# Patient Record
Sex: Male | Born: 1950 | Race: White | Hispanic: No | Smoking: Former smoker
Health system: Southern US, Community
[De-identification: ages and names within clinical notes are randomized; demographics above are authoritative.]

## PROBLEM LIST (undated history)

## (undated) DIAGNOSIS — I10 Essential (primary) hypertension: Secondary | ICD-10-CM

## (undated) DIAGNOSIS — K219 Gastro-esophageal reflux disease without esophagitis: Secondary | ICD-10-CM

## (undated) DIAGNOSIS — J9601 Acute respiratory failure with hypoxia: Secondary | ICD-10-CM

## (undated) DIAGNOSIS — R42 Dizziness and giddiness: Secondary | ICD-10-CM

## (undated) DIAGNOSIS — I2510 Atherosclerotic heart disease of native coronary artery without angina pectoris: Secondary | ICD-10-CM

## (undated) DIAGNOSIS — K922 Gastrointestinal hemorrhage, unspecified: Secondary | ICD-10-CM

## (undated) DIAGNOSIS — I447 Left bundle-branch block, unspecified: Secondary | ICD-10-CM

## (undated) DIAGNOSIS — I5020 Unspecified systolic (congestive) heart failure: Secondary | ICD-10-CM

## (undated) DIAGNOSIS — E785 Hyperlipidemia, unspecified: Secondary | ICD-10-CM

## (undated) DIAGNOSIS — I255 Ischemic cardiomyopathy: Secondary | ICD-10-CM

## (undated) DIAGNOSIS — R160 Hepatomegaly, not elsewhere classified: Secondary | ICD-10-CM

## (undated) DIAGNOSIS — I509 Heart failure, unspecified: Secondary | ICD-10-CM

## (undated) DIAGNOSIS — G629 Polyneuropathy, unspecified: Secondary | ICD-10-CM

## (undated) HISTORY — PX: CORONARY STENT PLACEMENT: SHX1402

## (undated) HISTORY — DX: Unspecified systolic (congestive) heart failure: I50.20

## (undated) HISTORY — DX: Gastro-esophageal reflux disease without esophagitis: K21.9

## (undated) HISTORY — DX: Morbid (severe) obesity due to excess calories: E66.01

## (undated) HISTORY — DX: Atherosclerotic heart disease of native coronary artery without angina pectoris: I25.10

## (undated) HISTORY — DX: Gastrointestinal hemorrhage, unspecified: K92.2

## (undated) HISTORY — DX: Ischemic cardiomyopathy: I25.5

---

## 1997-11-01 ENCOUNTER — Ambulatory Visit (HOSPITAL_COMMUNITY): Admission: RE | Admit: 1997-11-01 | Discharge: 1997-11-01 | Payer: Self-pay | Admitting: Cardiovascular Disease

## 1998-01-11 ENCOUNTER — Ambulatory Visit: Admission: RE | Admit: 1998-01-11 | Discharge: 1998-01-11 | Payer: Self-pay | Admitting: Pulmonary Disease

## 2008-02-23 ENCOUNTER — Ambulatory Visit: Payer: Self-pay | Admitting: Gastroenterology

## 2008-03-07 ENCOUNTER — Ambulatory Visit: Payer: Self-pay | Admitting: Gastroenterology

## 2008-03-07 LAB — HM COLONOSCOPY

## 2010-05-26 LAB — GLUCOSE, CAPILLARY: Glucose-Capillary: 158 mg/dL — ABNORMAL HIGH (ref 70–99)

## 2011-04-12 ENCOUNTER — Emergency Department (HOSPITAL_COMMUNITY)
Admission: EM | Admit: 2011-04-12 | Discharge: 2011-04-12 | Disposition: A | Discharge status: Home Health | Payer: No Typology Code available for payment source | Attending: Emergency Medicine | Admitting: Emergency Medicine

## 2011-04-12 ENCOUNTER — Encounter (HOSPITAL_COMMUNITY): Payer: Self-pay | Admitting: Emergency Medicine

## 2011-04-12 ENCOUNTER — Emergency Department (HOSPITAL_COMMUNITY): Payer: No Typology Code available for payment source

## 2011-04-12 ENCOUNTER — Other Ambulatory Visit: Payer: Self-pay

## 2011-04-12 DIAGNOSIS — H81399 Other peripheral vertigo, unspecified ear: Secondary | ICD-10-CM | POA: Insufficient documentation

## 2011-04-12 DIAGNOSIS — R110 Nausea: Secondary | ICD-10-CM | POA: Insufficient documentation

## 2011-04-12 DIAGNOSIS — I252 Old myocardial infarction: Secondary | ICD-10-CM | POA: Insufficient documentation

## 2011-04-12 DIAGNOSIS — E119 Type 2 diabetes mellitus without complications: Secondary | ICD-10-CM | POA: Insufficient documentation

## 2011-04-12 DIAGNOSIS — I219 Acute myocardial infarction, unspecified: Secondary | ICD-10-CM | POA: Insufficient documentation

## 2011-04-12 DIAGNOSIS — R61 Generalized hyperhidrosis: Secondary | ICD-10-CM | POA: Insufficient documentation

## 2011-04-12 LAB — COMPREHENSIVE METABOLIC PANEL
ALT: 63 U/L — ABNORMAL HIGH (ref 0–53)
AST: 69 U/L — ABNORMAL HIGH (ref 0–37)
Albumin: 3.6 g/dL (ref 3.5–5.2)
CO2: 21 mEq/L (ref 19–32)
Chloride: 95 mEq/L — ABNORMAL LOW (ref 96–112)
Creatinine, Ser: 0.79 mg/dL (ref 0.50–1.35)
GFR calc non Af Amer: >90 mL/min (ref >90)
Potassium: 3.5 mEq/L (ref 3.5–5.1)
Sodium: 134 mEq/L — ABNORMAL LOW (ref 135–145)
Total Bilirubin: 0.6 mg/dL (ref 0.3–1.2)

## 2011-04-12 LAB — DIFFERENTIAL
Basophils Absolute: 0 10*3/uL (ref 0.0–0.1)
Basophils Relative: 0 % (ref 0–1)
Lymphocytes Relative: 27 % (ref 12–46)
Neutro Abs: 5 10*3/uL (ref 1.7–7.7)
Neutrophils Relative %: 63 % (ref 43–77)

## 2011-04-12 LAB — CBC
HCT: 42.9 % (ref 39.0–52.0)
MCHC: 36.1 g/dL — ABNORMAL HIGH (ref 30.0–36.0)
Platelets: 183 10*3/uL (ref 150–400)
RDW: 13 % (ref 11.5–15.5)
WBC: 8 10*3/uL (ref 4.0–10.5)

## 2011-04-12 LAB — CARDIAC PANEL(CRET KIN+CKTOT+MB+TROPI): CK, MB: 2.8 ng/mL (ref 0.3–4.0)

## 2011-04-12 MED ORDER — DIAZEPAM 5 MG/ML IJ SOLN
5.0000 mg | Freq: Once | INTRAMUSCULAR | Status: AC
  Administered 2011-04-12: 5 mg via INTRAVENOUS
  Filled 2011-04-12: qty 2

## 2011-04-12 MED ORDER — SODIUM CHLORIDE 0.9 % IV SOLN
Freq: Once | INTRAVENOUS | Status: AC
  Administered 2011-04-12: 05:00:00 via INTRAVENOUS

## 2011-04-12 MED ORDER — MECLIZINE HCL 25 MG PO TABS
25.0000 mg | ORAL_TABLET | Freq: Once | ORAL | Status: AC
  Administered 2011-04-12: 25 mg via ORAL
  Filled 2011-04-12: qty 1

## 2011-04-12 MED ORDER — LORAZEPAM 1 MG PO TABS: 1.0000 mg | ORAL_TABLET | Freq: Three times a day (TID) | ORAL | Status: AC | PRN

## 2011-04-12 MED ORDER — MECLIZINE HCL 25 MG PO TABS: 25.0000 mg | ORAL_TABLET | Freq: Three times a day (TID) | ORAL | Status: AC | PRN

## 2011-04-12 NOTE — ED Provider Notes (Signed)
Patient is reevaluated after a dose of IV Valium and dizziness is markedly improved. He'll be sent home with prescriptions for meclizine and lorazepam.  Delora Fuel, MD 77/11/65 [id number]

## 2011-04-12 NOTE — ED Notes (Signed)
Arrived in the room, complain of dizziness, sweating at home this morning. Hx stent x3,

## 2011-04-12 NOTE — ED Notes (Signed)
To CT now

## 2011-04-12 NOTE — ED Provider Notes (Signed)
History     CSN: [id number]  Arrival date & time 04/12/11  0448   First MD Initiated Contact with Patient 04/12/11 0503      Chief Complaint  Patient presents with  . Dizziness    (Consider location/radiation/quality/duration/timing/severity/associated sxs/prior treatment) HPI Comments: Patient woke from sleep at [facility] with acute onset of dizziness, room-spinning sensation.  He became nauseated, sweaty.  Worse with movement, better with lying still.  He has a history of CAD with stents about 10 years ago.  No headache.  No fevers or chills.  The history is provided by the patient.    Past Medical History  Diagnosis Date  . Diabetes mellitus   . MI (myocardial infarction)     Past Surgical History  Procedure Date  . Coronary stent placement     History reviewed. No pertinent family history.  History  Substance Use Topics  . Smoking status: Never Smoker   . Smokeless tobacco: Not on file  . Alcohol Use: Yes     Occassional Use      Review of Systems  All other systems reviewed and are negative.    Allergies  Review of patient's allergies indicates no known allergies.  Home Medications  No current outpatient prescriptions on file.  BP 156/74  Pulse 86  Temp(Src) 98.1 F (36.7 C) (Oral)  Resp 20  SpO2 98%  Physical Exam  Nursing note and vitals reviewed. Constitutional: He is oriented to person, place, and time. He appears well-developed and well-nourished. No distress.  HENT:  Head: Normocephalic and atraumatic.  Mouth/Throat: Oropharynx is clear and moist.  Eyes: EOM are normal. Pupils are equal, round, and reactive to light.  Neck: Normal range of motion. Neck supple.  Cardiovascular: Normal rate and regular rhythm.  Exam reveals no friction rub.   No murmur heard. Pulmonary/Chest: Effort normal and breath sounds normal. No respiratory distress. He has no wheezes.  Abdominal: Soft. Bowel sounds are normal. He exhibits no distension. There is no  tenderness.  Musculoskeletal: Normal range of motion. He exhibits no edema.  Lymphadenopathy:    He has no cervical adenopathy.  Neurological: He is alert and oriented to person, place, and time. No cranial nerve deficit. Coordination normal.  Skin: Skin is warm and dry. He is not diaphoretic.    ED Course  Procedures (including critical care time)   Labs Reviewed  CBC  DIFFERENTIAL  COMPREHENSIVE METABOLIC PANEL  CARDIAC PANEL(CRET KIN+CKTOT+MB+TROPI)   No results found.   No diagnosis found.   Date: 04/12/2011  Rate: 89  Rhythm: normal sinus rhythm  QRS Axis: normal  Intervals: normal  ST/T Wave abnormalities: normal  Conduction Disutrbances:none  Narrative Interpretation:   Old EKG Reviewed: none available    MDM  The patient presents complaining of dizziness that sounds vertiginous in nature.  It is worse with turning head and changes in position and improves with rest.  The workup reveals no cardiac etiology and the CT head looks okay.  He was given meclizine with limited relief.  I have also given a dose of valium and will see how he responds.          Veryl Speak, MD 04/12/11 [phone]

## 2011-04-12 NOTE — ED Notes (Signed)
Patient complaining of dizziness, nausea, vomiting, and diaphoresis that woke him up from his sleep around 0230 this morning.  Patient denies chest pain, shortness of breath, and blurred vision.  Patient reports recent hyperglycemia (reading around 200 yesterday).

## 2011-04-12 NOTE — ED Notes (Signed)
Returned from CT.

## 2012-04-03 ENCOUNTER — Encounter (HOSPITAL_COMMUNITY): Payer: Self-pay | Admitting: Emergency Medicine

## 2012-04-03 ENCOUNTER — Emergency Department (HOSPITAL_COMMUNITY): Payer: 59 | Admitting: Anesthesiology

## 2012-04-03 ENCOUNTER — Emergency Department (HOSPITAL_COMMUNITY): Payer: 59

## 2012-04-03 ENCOUNTER — Inpatient Hospital Stay (HOSPITAL_COMMUNITY)
Admission: EM | Admit: 2012-04-03 | Discharge: 2012-04-06 | DRG: 465 | Disposition: A | Discharge status: Home Health | Payer: 59 | Attending: Orthopedic Surgery | Admitting: Orthopedic Surgery

## 2012-04-03 ENCOUNTER — Encounter (HOSPITAL_COMMUNITY): Payer: Self-pay | Admitting: Anesthesiology

## 2012-04-03 ENCOUNTER — Encounter (HOSPITAL_COMMUNITY)
Admission: EM | Disposition: A | Discharge status: Home Health | Payer: Self-pay | Source: Home / Self Care | Attending: Orthopedic Surgery

## 2012-04-03 DIAGNOSIS — K219 Gastro-esophageal reflux disease without esophagitis: Secondary | ICD-10-CM | POA: Diagnosis present

## 2012-04-03 DIAGNOSIS — Z9861 Coronary angioplasty status: Secondary | ICD-10-CM

## 2012-04-03 DIAGNOSIS — Y92410 Unspecified street and highway as the place of occurrence of the external cause: Secondary | ICD-10-CM

## 2012-04-03 DIAGNOSIS — I2510 Atherosclerotic heart disease of native coronary artery without angina pectoris: Secondary | ICD-10-CM | POA: Diagnosis present

## 2012-04-03 DIAGNOSIS — S5010XA Contusion of unspecified forearm, initial encounter: Secondary | ICD-10-CM | POA: Diagnosis present

## 2012-04-03 DIAGNOSIS — S82109B Unspecified fracture of upper end of unspecified tibia, initial encounter for open fracture type I or II: Principal | ICD-10-CM | POA: Diagnosis present

## 2012-04-03 DIAGNOSIS — E119 Type 2 diabetes mellitus without complications: Secondary | ICD-10-CM | POA: Diagnosis present

## 2012-04-03 DIAGNOSIS — Z7982 Long term (current) use of aspirin: Secondary | ICD-10-CM

## 2012-04-03 DIAGNOSIS — I252 Old myocardial infarction: Secondary | ICD-10-CM

## 2012-04-03 DIAGNOSIS — Z79899 Other long term (current) drug therapy: Secondary | ICD-10-CM

## 2012-04-03 HISTORY — PX: IRRIGATION AND DEBRIDEMENT KNEE: SHX5185

## 2012-04-03 HISTORY — PX: EXTERNAL FIXATION LEG: SHX1549

## 2012-04-03 LAB — GLUCOSE, CAPILLARY
Glucose-Capillary: 254 mg/dL — ABNORMAL HIGH (ref 70–99)
Glucose-Capillary: 258 mg/dL — ABNORMAL HIGH (ref 70–99)

## 2012-04-03 SURGERY — EXTERNAL FIXATION, LOWER EXTREMITY
Anesthesia: General | Site: Knee | Laterality: Left | Wound class: Dirty or Infected

## 2012-04-03 MED ORDER — VECURONIUM BROMIDE 10 MG IV SOLR
INTRAVENOUS | Status: DC | PRN
  Administered 2012-04-03: 6 mg via INTRAVENOUS

## 2012-04-03 MED ORDER — ONDANSETRON HCL 4 MG/2ML IJ SOLN
INTRAMUSCULAR | Status: DC | PRN
  Administered 2012-04-03: 4 mg via INTRAVENOUS

## 2012-04-03 MED ORDER — OXYCODONE HCL 5 MG PO TABS: 5.0000 mg | ORAL_TABLET | Freq: Once | ORAL | Status: AC | PRN

## 2012-04-03 MED ORDER — PROPOFOL 10 MG/ML IV BOLUS
INTRAVENOUS | Status: DC | PRN
  Administered 2012-04-03: 150 mg via INTRAVENOUS

## 2012-04-03 MED ORDER — CHLORHEXIDINE GLUCONATE 4 % EX LIQD
60.0000 mL | Freq: Once | CUTANEOUS | Status: DC
  Filled 2012-04-03: qty 60

## 2012-04-03 MED ORDER — SODIUM CHLORIDE 0.9 % IR SOLN
Status: DC | PRN
  Administered 2012-04-03: 9000 mL

## 2012-04-03 MED ORDER — DEXTROSE 5 % IV SOLN
INTRAVENOUS | Status: DC | PRN
  Administered 2012-04-03: 22:00:00 via INTRAVENOUS

## 2012-04-03 MED ORDER — OXYCODONE HCL 5 MG/5ML PO SOLN: 5.0000 mg | Freq: Once | ORAL | Status: AC | PRN

## 2012-04-03 MED ORDER — CEFAZOLIN SODIUM 1 G IJ SOLR
2.0000 g | INTRAMUSCULAR | Status: AC
  Administered 2012-04-03: 2 g via INTRAVENOUS

## 2012-04-03 MED ORDER — ARTIFICIAL TEARS OP OINT
TOPICAL_OINTMENT | OPHTHALMIC | Status: DC | PRN
  Administered 2012-04-03: 1 via OPHTHALMIC

## 2012-04-03 MED ORDER — METOCLOPRAMIDE HCL 5 MG/ML IJ SOLN
INTRAMUSCULAR | Status: DC | PRN
  Administered 2012-04-03: 10 mg via INTRAVENOUS

## 2012-04-03 MED ORDER — ACETAMINOPHEN 10 MG/ML IV SOLN
INTRAVENOUS | Status: AC
  Filled 2012-04-03: qty 100

## 2012-04-03 MED ORDER — GLYCOPYRROLATE 0.2 MG/ML IJ SOLN
INTRAMUSCULAR | Status: DC | PRN
  Administered 2012-04-03: 0.6 mg via INTRAVENOUS

## 2012-04-03 MED ORDER — LIDOCAINE HCL (CARDIAC) 20 MG/ML IV SOLN
INTRAVENOUS | Status: DC | PRN
  Administered 2012-04-03: 80 mg via INTRAVENOUS

## 2012-04-03 MED ORDER — HYDROMORPHONE HCL PF 1 MG/ML IJ SOLN
0.2500 mg | INTRAMUSCULAR | Status: DC | PRN
  Administered 2012-04-03 – 2012-04-04 (×4): 0.5 mg via INTRAVENOUS

## 2012-04-03 MED ORDER — HYDROMORPHONE HCL PF 1 MG/ML IJ SOLN
INTRAMUSCULAR | Status: AC
  Filled 2012-04-03: qty 1

## 2012-04-03 MED ORDER — SODIUM CHLORIDE 0.9 % IV SOLN: INTRAVENOUS | Status: DC

## 2012-04-03 MED ORDER — LACTATED RINGERS IV SOLN
INTRAVENOUS | Status: DC | PRN
  Administered 2012-04-03 (×2): via INTRAVENOUS

## 2012-04-03 MED ORDER — FENTANYL CITRATE 0.05 MG/ML IJ SOLN
INTRAMUSCULAR | Status: DC | PRN
  Administered 2012-04-03: 100 ug via INTRAVENOUS
  Administered 2012-04-03 (×2): 50 ug via INTRAVENOUS
  Administered 2012-04-03: 100 ug via INTRAVENOUS

## 2012-04-03 MED ORDER — ONDANSETRON HCL 4 MG/2ML IJ SOLN: 4.0000 mg | Freq: Once | INTRAMUSCULAR | Status: AC | PRN

## 2012-04-03 MED ORDER — MIDAZOLAM HCL 5 MG/5ML IJ SOLN
INTRAMUSCULAR | Status: DC | PRN
  Administered 2012-04-03: 2 mg via INTRAVENOUS

## 2012-04-03 MED ORDER — SUCCINYLCHOLINE CHLORIDE 20 MG/ML IJ SOLN
INTRAMUSCULAR | Status: DC | PRN
  Administered 2012-04-03: 120 mg via INTRAVENOUS

## 2012-04-03 MED ORDER — ACETAMINOPHEN 10 MG/ML IV SOLN
INTRAVENOUS | Status: DC | PRN
  Administered 2012-04-03: 1000 mg via INTRAVENOUS

## 2012-04-03 MED ORDER — SODIUM CHLORIDE 0.9 % IV SOLN
10.0000 mg | INTRAVENOUS | Status: DC | PRN
  Administered 2012-04-03: 15 ug/min via INTRAVENOUS

## 2012-04-03 MED ORDER — NEOSTIGMINE METHYLSULFATE 1 MG/ML IJ SOLN
INTRAMUSCULAR | Status: DC | PRN
  Administered 2012-04-03: 5 mg via INTRAVENOUS

## 2012-04-03 SURGICAL SUPPLY — 60 items
5MM SHORT HALF PIN DRILL BIT ×2 IMPLANT
5mm x 45mm half pin ×4 IMPLANT
BANDAGE GAUZE ELAST BULKY 4 IN (GAUZE/BANDAGES/DRESSINGS) ×2 IMPLANT
BAR EXFIX SM BONE 10.5X250 (Trauma) ×2 IMPLANT
BLADE SURG 10 STRL SS (BLADE) ×2 IMPLANT
BNDG COHESIVE 4X5 TAN STRL (GAUZE/BANDAGES/DRESSINGS) ×2 IMPLANT
BNDG COHESIVE 6X5 TAN STRL LF (GAUZE/BANDAGES/DRESSINGS) ×2 IMPLANT
BNDG ELASTIC 6X15 VLCR STRL LF (GAUZE/BANDAGES/DRESSINGS) ×2 IMPLANT
CHLORAPREP W/TINT 26ML (MISCELLANEOUS) ×2 IMPLANT
CLAMP BAR TO BAR (Clamp) ×4 IMPLANT
CLAMP BAR TO RING (Clamp) ×8 IMPLANT
CLAMP PIN MULTI (Clamp) ×4 IMPLANT
CLOTH BEACON ORANGE TIMEOUT ST (SAFETY) ×2 IMPLANT
COVER SURGICAL LIGHT HANDLE (MISCELLANEOUS) ×2 IMPLANT
CUFF TOURNIQUET SINGLE 34IN LL (TOURNIQUET CUFF) ×2 IMPLANT
CUFF TOURNIQUET SINGLE 44IN (TOURNIQUET CUFF) IMPLANT
DRAIN JACKSON PRATT 10MM FLAT (MISCELLANEOUS) ×2 IMPLANT
DRAPE C-ARM 42X72 X-RAY (DRAPES) ×2 IMPLANT
DRAPE C-ARMOR (DRAPES) ×2 IMPLANT
DRAPE U-SHAPE 47X51 STRL (DRAPES) ×2 IMPLANT
DRSG ADAPTIC 3X8 NADH LF (GAUZE/BANDAGES/DRESSINGS) ×2 IMPLANT
DRSG PAD ABDOMINAL 8X10 ST (GAUZE/BANDAGES/DRESSINGS) IMPLANT
ELECT REM PT RETURN 9FT ADLT (ELECTROSURGICAL) ×2
ELECTRODE REM PT RTRN 9FT ADLT (ELECTROSURGICAL) ×1 IMPLANT
EVACUATOR SILICONE 100CC (DRAIN) ×2 IMPLANT
GAUZE XEROFORM 5X9 LF (GAUZE/BANDAGES/DRESSINGS) ×2 IMPLANT
GLOVE BIO SURGEON STRL SZ8 (GLOVE) ×6 IMPLANT
GLOVE BIOGEL PI IND STRL 8 (GLOVE) ×3 IMPLANT
GLOVE BIOGEL PI INDICATOR 8 (GLOVE) ×3
GOWN PREVENTION PLUS XLARGE (GOWN DISPOSABLE) ×2 IMPLANT
GOWN STRL NON-REIN LRG LVL3 (GOWN DISPOSABLE) IMPLANT
GOWN STRL REIN 2XL LVL4 (GOWN DISPOSABLE) ×4 IMPLANT
IV NS IRRIG 3000ML ARTHROMATIC (IV SOLUTION) ×6 IMPLANT
KIT BASIN OR (CUSTOM PROCEDURE TRAY) ×2 IMPLANT
KIT ROOM TURNOVER OR (KITS) ×2 IMPLANT
MANIFOLD NEPTUNE II (INSTRUMENTS) ×2 IMPLANT
NEEDLE 22X1 1/2 (OR ONLY) (NEEDLE) IMPLANT
NS IRRIG 1000ML POUR BTL (IV SOLUTION) IMPLANT
PACK ORTHO EXTREMITY (CUSTOM PROCEDURE TRAY) ×2 IMPLANT
PAD ARMBOARD 7.5X6 YLW CONV (MISCELLANEOUS) ×4 IMPLANT
PAD CAST 4YDX4 CTTN HI CHSV (CAST SUPPLIES) ×1 IMPLANT
PADDING CAST COTTON 4X4 STRL (CAST SUPPLIES) ×1
PADDING CAST COTTON 6X4 STRL (CAST SUPPLIES) ×2 IMPLANT
SMITH AND NEPHEW 4.8 LONG DRILL ×2 IMPLANT
SOAP 2 % CHG 4 OZ (WOUND CARE) ×2 IMPLANT
SPONGE GAUZE 4X4 12PLY (GAUZE/BANDAGES/DRESSINGS) ×2 IMPLANT
STAPLER VISISTAT 35W (STAPLE) IMPLANT
SUCTION FRAZIER TIP 10 FR DISP (SUCTIONS) IMPLANT
SUT ETHILON 2 0 PSLX (SUTURE) ×2 IMPLANT
SUT PROLENE 3 0 PS 2 (SUTURE) ×2 IMPLANT
SUT VIC AB 3-0 PS2 18 (SUTURE) ×1
SUT VIC AB 3-0 PS2 18XBRD (SUTURE) ×1 IMPLANT
SYR CONTROL 10ML LL (SYRINGE) IMPLANT
TOWEL OR 17X24 6PK STRL BLUE (TOWEL DISPOSABLE) ×2 IMPLANT
TOWEL OR 17X26 10 PK STRL BLUE (TOWEL DISPOSABLE) ×2 IMPLANT
TUBE CONNECTING 12X1/4 (SUCTIONS) ×2 IMPLANT
TUBING TUR DISP (UROLOGICAL SUPPLIES) ×2 IMPLANT
WATER STERILE IRR 1000ML POUR (IV SOLUTION) IMPLANT
half pin ×4 IMPLANT
jet x fix ×4 IMPLANT
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
## 2012-04-03 NOTE — Anesthesia Preprocedure Evaluation (Signed)
Anesthesia Evaluation  Patient identified by MRN, date of birth, ID band Patient awake    Reviewed: Allergy & Precautions, H&P , NPO status , Patient's Chart, lab work & pertinent test results  Airway Mallampati: I TM Distance: >3 FB Neck ROM: Full    Dental  (+) Edentulous Upper, Edentulous Lower, Lower Dentures, Upper Dentures and Dental Advisory Given   Pulmonary  breath sounds clear to auscultation        Cardiovascular + Past MI Rhythm:Regular Rate:Normal     Neuro/Psych    GI/Hepatic   Endo/Other  diabetes, Well Controlled, Type 2, Oral Hypoglycemic Agents  Renal/GU      Musculoskeletal   Abdominal   Peds  Hematology   Anesthesia Other Findings   Reproductive/Obstetrics                           Anesthesia Physical Anesthesia Plan  ASA: III and emergent  Anesthesia Plan: General   Post-op Pain Management:    Induction: Intravenous, Rapid sequence and Cricoid pressure planned  Airway Management Planned: Oral ETT  Additional Equipment:   Intra-op Plan:   Post-operative Plan: Extubation in OR  Informed Consent: I have reviewed the patients History and Physical, chart, labs and discussed the procedure including the risks, benefits and alternatives for the proposed anesthesia with the patient or authorized representative who has indicated his/her understanding and acceptance.   Dental advisory given  Plan Discussed with: CRNA, Anesthesiologist and Surgeon  Anesthesia Plan Comments:         Anesthesia Quick Evaluation

## 2012-04-03 NOTE — Transfer of Care (Signed)
Immediate Anesthesia Transfer of Care Note  Patient: Devin Hanna  Procedure(s) Performed: Procedure(s): EXTERNAL FIXATION LEG (Left) IRRIGATION AND DEBRIDEMENT KNEE (Left)  Patient Location: PACU  Anesthesia Type:General  Level of Consciousness: oriented, sedated, patient cooperative and responds to stimulation  Airway & Oxygen Therapy: Patient Spontanous Breathing and Patient connected to nasal cannula oxygen  Post-op Assessment: Report given to PACU RN, Post -op Vital signs reviewed and stable, Patient moving all extremities and Patient moving all extremities X 4  Post vital signs: Reviewed and stable  Complications: No apparent anesthesia complications

## 2012-04-03 NOTE — Brief Op Note (Addendum)
04/03/2012  11:35 PM  PATIENT:  Obie Kallenbach Wedel  62 y.o. male  PRE-OPERATIVE DIAGNOSIS:  Open left tibial plateau fracture  POST-OPERATIVE DIAGNOSIS:  1.  Left knee laceration 8 cm long including skin, subcut tissue and muscle 2.  Left tibial plateau fracture  Procedure(s): 1.  Irrigation and excisional debridement with complex repair 8 cm long left knee wound 2.  Closed reduction of left tibial plateau fracture 3.  Application of uniplane external fixator  SURGEON:  Wylene Simmer, MD  ASSISTANT: n/a  ANESTHESIA:   General  EBL:  150 cc  TOURNIQUET:  n/a  COMPLICATIONS:  None apparent  DISPOSITION:  Extubated, awake and stable to recovery.  DICTATION ID:  [id number]
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
## 2012-04-03 NOTE — Preoperative (Signed)
Beta Blockers   Reason not to administer Beta Blockers:Metoprolol 25 mg po @0630  on 04/03/2012

## 2012-04-03 NOTE — Anesthesia Postprocedure Evaluation (Signed)
  Anesthesia Post-op Note  Patient: Devin Hanna  Procedure(s) Performed: Procedure(s): EXTERNAL FIXATION LEG (Left) IRRIGATION AND DEBRIDEMENT KNEE (Left)  Patient Location: PACU  Anesthesia Type:General  Level of Consciousness: awake, alert  and oriented  Airway and Oxygen Therapy: Patient Spontanous Breathing and Patient connected to nasal cannula oxygen  Post-op Pain: mild  Post-op Assessment: Post-op Vital signs reviewed  Post-op Vital Signs: Reviewed  Complications: No apparent anesthesia complications

## 2012-04-03 NOTE — ED Notes (Signed)
V/S: P 101, R 18, 98% RA,

## 2012-04-04 ENCOUNTER — Inpatient Hospital Stay (HOSPITAL_COMMUNITY): Payer: 59

## 2012-04-04 LAB — GLUCOSE, CAPILLARY
Glucose-Capillary: 321 mg/dL — ABNORMAL HIGH (ref 70–99)
Glucose-Capillary: 162 mg/dL — ABNORMAL HIGH (ref 70–99)

## 2012-04-04 LAB — BASIC METABOLIC PANEL
CO2: 20 mEq/L (ref 19–32)
Chloride: 98 mEq/L (ref 96–112)
Glucose, Bld: 322 mg/dL — ABNORMAL HIGH (ref 70–99)
Potassium: 4.4 mEq/L (ref 3.5–5.1)
Sodium: 134 mEq/L — ABNORMAL LOW (ref 135–145)

## 2012-04-04 LAB — CBC
Hemoglobin: 11.9 g/dL — ABNORMAL LOW (ref 13.0–17.0)
MCH: 32.6 pg (ref 26.0–34.0)
MCV: 92.9 fL (ref 78.0–100.0)
RBC: 3.65 MIL/uL — ABNORMAL LOW (ref 4.22–5.81)

## 2012-04-04 MED ORDER — LISINOPRIL 20 MG PO TABS
20.0000 mg | ORAL_TABLET | Freq: Every day | ORAL | Status: DC
  Administered 2012-04-04 – 2012-04-06 (×3): 20 mg via ORAL
  Filled 2012-04-04 (×3): qty 1

## 2012-04-04 MED ORDER — SENNA 8.6 MG PO TABS
2.0000 | ORAL_TABLET | Freq: Two times a day (BID) | ORAL | Status: DC
  Administered 2012-04-04 – 2012-04-06 (×5): 17.2 mg via ORAL
  Filled 2012-04-04 (×6): qty 2

## 2012-04-04 MED ORDER — GLIPIZIDE 5 MG PO TABS
5.0000 mg | ORAL_TABLET | Freq: Every day | ORAL | Status: DC
  Administered 2012-04-04 – 2012-04-05 (×2): 5 mg via ORAL
  Filled 2012-04-04 (×3): qty 1

## 2012-04-04 MED ORDER — DOCUSATE SODIUM 100 MG PO CAPS
100.0000 mg | ORAL_CAPSULE | Freq: Two times a day (BID) | ORAL | Status: DC
  Administered 2012-04-04 – 2012-04-06 (×5): 100 mg via ORAL
  Filled 2012-04-04 (×6): qty 1

## 2012-04-04 MED ORDER — METOPROLOL TARTRATE 25 MG PO TABS
25.0000 mg | ORAL_TABLET | Freq: Two times a day (BID) | ORAL | Status: DC
  Administered 2012-04-04 – 2012-04-06 (×5): 25 mg via ORAL
  Filled 2012-04-04 (×6): qty 1

## 2012-04-04 MED ORDER — PROMETHAZINE HCL 25 MG/ML IJ SOLN
12.5000 mg | Freq: Four times a day (QID) | INTRAMUSCULAR | Status: DC | PRN
  Administered 2012-04-04: 12.5 mg via INTRAVENOUS
  Filled 2012-04-04: qty 1

## 2012-04-04 MED ORDER — ONDANSETRON HCL 4 MG/2ML IJ SOLN
4.0000 mg | Freq: Four times a day (QID) | INTRAMUSCULAR | Status: DC | PRN
  Administered 2012-04-05: 4 mg via INTRAVENOUS
  Filled 2012-04-04: qty 2

## 2012-04-04 MED ORDER — ENOXAPARIN SODIUM 40 MG/0.4ML ~~LOC~~ SOLN
40.0000 mg | Freq: Every day | SUBCUTANEOUS | Status: DC
  Administered 2012-04-04 – 2012-04-06 (×3): 40 mg via SUBCUTANEOUS
  Filled 2012-04-04 (×3): qty 0.4

## 2012-04-04 MED ORDER — OXYCODONE HCL 5 MG PO TABS
5.0000 mg | ORAL_TABLET | ORAL | Status: DC | PRN
  Administered 2012-04-04 – 2012-04-06 (×7): 10 mg via ORAL
  Filled 2012-04-04 (×7): qty 2

## 2012-04-04 MED ORDER — ALUM & MAG HYDROXIDE-SIMETH 200-200-20 MG/5ML PO SUSP
15.0000 mL | ORAL | Status: DC | PRN
  Administered 2012-04-04 (×4): 15 mL via ORAL
  Filled 2012-04-04 (×4): qty 30

## 2012-04-04 MED ORDER — ONDANSETRON HCL 4 MG PO TABS
4.0000 mg | ORAL_TABLET | Freq: Four times a day (QID) | ORAL | Status: DC | PRN
  Administered 2012-04-04: 4 mg via ORAL
  Filled 2012-04-04: qty 1

## 2012-04-04 MED ORDER — SIMVASTATIN 20 MG PO TABS
20.0000 mg | ORAL_TABLET | Freq: Every evening | ORAL | Status: DC
  Administered 2012-04-04 – 2012-04-05 (×2): 20 mg via ORAL
  Filled 2012-04-04 (×3): qty 1

## 2012-04-04 MED ORDER — HYDROMORPHONE HCL PF 1 MG/ML IJ SOLN
INTRAMUSCULAR | Status: AC
  Filled 2012-04-04: qty 1

## 2012-04-04 MED ORDER — SODIUM CHLORIDE 0.9 % IV SOLN: INTRAVENOUS | Status: DC

## 2012-04-04 MED ORDER — INSULIN ASPART 100 UNIT/ML ~~LOC~~ SOLN
0.0000 [IU] | Freq: Three times a day (TID) | SUBCUTANEOUS | Status: DC
  Administered 2012-04-04: 8 [IU] via SUBCUTANEOUS
  Administered 2012-04-04: 3 [IU] via SUBCUTANEOUS
  Administered 2012-04-04: 11 [IU] via SUBCUTANEOUS
  Administered 2012-04-05: 3 [IU] via SUBCUTANEOUS
  Administered 2012-04-05: 8 [IU] via SUBCUTANEOUS

## 2012-04-04 MED ORDER — QUINAPRIL HCL 10 MG PO TABS: 20.0000 mg | ORAL_TABLET | Freq: Every morning | ORAL | Status: DC

## 2012-04-04 MED ORDER — GLIPIZIDE 5 MG PO TABS
10.0000 mg | ORAL_TABLET | Freq: Every day | ORAL | Status: DC
  Administered 2012-04-04 – 2012-04-06 (×3): 10 mg via ORAL
  Filled 2012-04-04 (×4): qty 2

## 2012-04-04 MED ORDER — METFORMIN HCL 500 MG PO TABS
1000.0000 mg | ORAL_TABLET | Freq: Two times a day (BID) | ORAL | Status: DC
  Administered 2012-04-04 – 2012-04-06 (×5): 1000 mg via ORAL
  Filled 2012-04-04 (×7): qty 2

## 2012-04-04 MED ORDER — CEFAZOLIN SODIUM-DEXTROSE 2-3 GM-% IV SOLR
2.0000 g | Freq: Four times a day (QID) | INTRAVENOUS | Status: AC
  Administered 2012-04-04 (×3): 2 g via INTRAVENOUS
  Filled 2012-04-04 (×4): qty 50

## 2012-04-04 MED ORDER — HYDROMORPHONE HCL PF 1 MG/ML IJ SOLN: 0.5000 mg | INTRAMUSCULAR | Status: DC | PRN

## 2012-04-04 NOTE — Progress Notes (Signed)
Subjective: 1 Day Post-Op Procedure(s) (LRB): EXTERNAL FIXATION LEG (Left) IRRIGATION AND DEBRIDEMENT KNEE (Left) Patient reports pain as mild.   Denies pain elsewhere on his body aside from left lower extremity.  Specifically he denies pain at the left forearm.  He c/o reflux for which he usually takes Tums.  Objective: Vital signs in last 24 hours: Temp:  [97.7 F (36.5 C)-99.2 F (37.3 C)] 98.7 F (37.1 C) (02/24 0239) Pulse Rate:  [80-97] 94 (02/24 0239) Resp:  [16-30] 18 (02/24 0239) BP: (90-146)/(48-77) 146/77 mmHg (02/24 0239) SpO2:  [95 %-99 %] 98 % (02/24 0239) Weight:  [102.059 kg (225 lb)] 102.059 kg (225 lb) (02/24 0036)  Intake/Output from previous day: 02/23 0701 - 02/24 0700 In: 2450 [P.O.:600; I.V.:1800; IV Piggyback:50] Out: 65 [Urine:800; Drains:40; Blood:50] Intake/Output this shift:    No results found for this basename: HGB,  in the last 72 hours No results found for this basename: WBC, RBC, HCT, PLT,  in the last 72 hours No results found for this basename: NA, K, CL, CO2, BUN, CREATININE, GLUCOSE, CALCIUM,  in the last 72 hours No results found for this basename: LABPT, INR,  in the last 72 hours  L LE with ex fix in place.  Drain with scant SS output.  wiggles toes and feels LT at foot.  Assessment/Plan: 1 Day Post-Op Procedure(s) (LRB): EXTERNAL FIXATION LEG (Left) IRRIGATION AND DEBRIDEMENT KNEE (Left) CT scan of left knee today.  Begin PT and OT with a plan to return home in the next day or two.  He'll need definitive surgery on his right tibial plateau within the next few weeks. Mylanta for reflux.  Wylene Simmer 04/04/2012, 7:32 AM

## 2012-04-04 NOTE — Addendum Note (Signed)
Addendum created 04/04/12 0106 by Fidela Juneau, CRNA   Modules edited: Anesthesia Blocks and Procedures
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
## 2012-04-04 NOTE — Anesthesia Procedure Notes (Signed)
Procedure Name: Intubation Date/Time: 04/03/2012 9:46 PM Performed by: Jacquiline Doe A Pre-anesthesia Checklist: Patient identified, Timeout performed, Emergency Drugs available, Suction available and Patient being monitored Patient Re-evaluated:Patient Re-evaluated prior to inductionOxygen Delivery Method: Circle system utilized Preoxygenation: Pre-oxygenation with 100% oxygen Intubation Type: IV induction, Rapid sequence and Cricoid Pressure applied Laryngoscope Size: Mac and 4 Grade View: Grade I Tube type: Oral Tube size: 8.0 mm Number of attempts: 1 Airway Equipment and Method: Stylet Placement Confirmation: ETT inserted through vocal cords under direct vision,  breath sounds checked- equal and bilateral and positive ETCO2 Secured at: 23 cm Tube secured with: Tape Dental Injury: Teeth and Oropharynx as per pre-operative assessment

## 2012-04-04 NOTE — Evaluation (Signed)
Physical Therapy Evaluation Patient Details Name: Devin Hanna MRN: [id number] DOB: [birth year] Today's Date: 04/04/2012 Time: 6387-5643 PT Time Calculation (min): 28 min  PT Assessment / Plan / Recommendation Clinical Impression  pt rpesents with L Tibial Plateau fx with external fixator.  pt very motivated and plans to discuss with family where he will be staying and who is available to A him.  Ideally pt would stay at brother's home to avoid any steps.  pt to check with family to see if they have any DME he can borrow.      PT Assessment  Patient needs continued PT services    Follow Up Recommendations  Home health PT;Supervision/Assistance - 24 hour    Does the patient have the potential to tolerate intense rehabilitation      Barriers to Discharge None      Equipment Recommendations  Rolling walker with 5" wheels;Wheelchair (measurements PT);Wheelchair cushion (measurements PT) (3-in-1, W/C needs elevating leg rest)    Recommendations for Other Services OT consult   Frequency Min 5X/week    Precautions / Restrictions Precautions Precautions: Fall Restrictions Weight Bearing Restrictions: Yes LLE Weight Bearing: Non weight bearing   Pertinent Vitals/Pain Indicates pain is 3/10 with mobility.        Mobility  Bed Mobility Bed Mobility: Supine to Sit;Sitting - Scoot to Edge of Bed Supine to Sit: 4: Min assist;With rails;HOB elevated Sitting - Scoot to [employer] of Bed: 4: Min assist Details for Bed Mobility Assistance: A to support L LE during bed mobility.   Transfers Transfers: Sit to Stand;Stand to Sit Sit to Stand: 4: Min assist;With upper extremity assist;From bed Stand to Sit: 4: Min assist;With upper extremity assist;To chair/3-in-1;With armrests Details for Transfer Assistance: cues for UE use, positioning of L LE, controlling descent to chair.   Ambulation/Gait Ambulation/Gait Assistance: 4: Min guard Ambulation Distance (Feet): 25 Feet Assistive device:  Rolling walker Ambulation/Gait Assistance Details: cues for positioning in RW, upright posture, hop technique.   Gait Pattern: Step-to pattern;Trunk flexed Stairs: No Wheelchair Mobility Wheelchair Mobility: No    Exercises     PT Diagnosis: Difficulty walking;Acute pain  PT Problem List: Decreased strength;Decreased activity tolerance;Decreased balance;Decreased mobility;Decreased knowledge of use of DME;Decreased knowledge of precautions;Pain PT Treatment Interventions: DME instruction;Gait training;Stair training;Functional mobility training;Therapeutic activities;Therapeutic exercise;Balance training;Patient/family education   PT Goals Acute Rehab PT Goals PT Goal Formulation: With patient Time For Goal Achievement: 04/18/12 Potential to Achieve Goals: Good Pt will go Supine/Side to Sit: with modified independence PT Goal: Supine/Side to Sit - Progress: Goal set today Pt will go Sit to Supine/Side: with modified independence PT Goal: Sit to Supine/Side - Progress: Goal set today Pt will go Sit to Stand: with modified independence PT Goal: Sit to Stand - Progress: Goal set today Pt will go Stand to Sit: with modified independence PT Goal: Stand to Sit - Progress: Goal set today Pt will Ambulate: 51 - 150 feet;with supervision;with rolling walker PT Goal: Ambulate - Progress: Goal set today Pt will Go Up / Down Stairs: 3-5 stairs;with min assist;with rolling walker PT Goal: Up/Down Stairs - Progress: Goal set today  Visit Information  Last PT Received On: 04/04/12 Assistance Needed: +2 (helpful for chair follow)    Subjective Data  Subjective: This is my first surgery.   Patient Stated Goal: Home   Prior Functioning  Home Living Lives With: Alone Available Help at Discharge: Family (At least PRN, pt to check on increased A.  ) Additional Comments: pt to  ask brother and sister-in-law if he can stay in their basement that has a flat entry and walk-in-shower.  Brother and  sister-in-law are retired and would be around most of day.  If pt ahs to D/C to home or daughter's home he will only have intermittant A from family.   Prior Function Level of Independence: Independent Able to Take Stairs?: Yes Driving: Yes Communication Communication: No difficulties    Cognition  Cognition Overall Cognitive Status: Appears within functional limits for tasks assessed/performed Arousal/Alertness: Awake/alert Orientation Level: Appears intact for tasks assessed Behavior During Session: [facility] for tasks performed    Extremity/Trunk Assessment Right Lower Extremity Assessment RLE ROM/Strength/Tone: [facility] for tasks assessed RLE Sensation: WFL - Light Touch Left Lower Extremity Assessment LLE ROM/Strength/Tone: Deficits LLE ROM/Strength/Tone Deficits: pt in external fixators maintaining knee extension.   LLE Sensation: WFL - Light Touch Trunk Assessment Trunk Assessment: Normal   Balance Balance Balance Assessed: No  End of Session PT - End of Session Equipment Utilized During Treatment: Gait belt Activity Tolerance: Patient limited by fatigue;Patient limited by pain Patient left: in chair;with call bell/phone within reach Nurse Communication: Mobility status  GP     Damaria Vachon F 04/04/2012, 9:15 AM

## 2012-04-04 NOTE — Progress Notes (Signed)
Inpatient Diabetes Program Recommendations  AACE/ADA: New Consensus Statement on Inpatient Glycemic Control (2013)  Target Ranges:  Prepandial:   less than 140 mg/dL      Peak postprandial:   less than 180 mg/dL (1-2 hours)      Critically ill patients:  140 - 180 mg/dL   Reason for Visit: CBG's greater than goal.  Note history of diabetes.  It appears that patient was only taking oral agents prior to admission for diabetes.  Results for Devin Hanna, Devin Hanna (MRN [id number]) as of 04/04/2012 11:50  Ref. Range 04/03/2012 21:10 04/03/2012 23:43 04/04/2012 07:26 04/04/2012 10:51  Glucose-Capillary Latest Range: 70-99 mg/dL 254 (H) 258 (H) 321 (H) 265 (H)   Please check A1C to determine prehospitalization glycemic control. Also, please add Lantus 20 units daily while patient is in hospital.  Will follow.  Needs follow-up with PCP after discharge for diabetes.  Adah Perl, RN, MSN, BC-ADM Diabetes Coordinator [phone]

## 2012-04-05 ENCOUNTER — Encounter (HOSPITAL_COMMUNITY): Payer: Self-pay | Admitting: Orthopedic Surgery

## 2012-04-05 LAB — GLUCOSE, CAPILLARY
Glucose-Capillary: 267 mg/dL — ABNORMAL HIGH (ref 70–99)
Glucose-Capillary: 84 mg/dL (ref 70–99)

## 2012-04-05 LAB — BASIC METABOLIC PANEL
Calcium: 9.4 mg/dL (ref 8.4–10.5)
GFR calc non Af Amer: 89 mL/min — ABNORMAL LOW (ref >90)
Glucose, Bld: 246 mg/dL — ABNORMAL HIGH (ref 70–99)
Sodium: 136 mEq/L (ref 135–145)

## 2012-04-05 MED ORDER — RIVAROXABAN 10 MG PO TABS: 10.0000 mg | ORAL_TABLET | Freq: Every day | ORAL | Status: DC

## 2012-04-05 MED ORDER — OXYCODONE HCL 5 MG PO TABS: 5.0000 mg | ORAL_TABLET | ORAL | Status: DC | PRN

## 2012-04-05 MED ORDER — SENNA 8.6 MG PO TABS: 2.0000 | ORAL_TABLET | Freq: Two times a day (BID) | ORAL | Status: DC

## 2012-04-05 MED ORDER — INSULIN GLARGINE 100 UNIT/ML ~~LOC~~ SOLN
20.0000 [IU] | Freq: Every day | SUBCUTANEOUS | Status: DC
  Administered 2012-04-05 – 2012-04-06 (×2): 20 [IU] via SUBCUTANEOUS

## 2012-04-05 MED ORDER — DSS 100 MG PO CAPS: 100.0000 mg | ORAL_CAPSULE | Freq: Two times a day (BID) | ORAL | Status: DC

## 2012-04-05 NOTE — Plan of Care (Signed)
Problem: Phase II Progression Outcomes Goal: Discharge plan established Recommend shower chair, 3 in 1, ADL A/E and HH OT for ADL trg and ADL mobility safety trg after acute care d/c
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
## 2012-04-05 NOTE — Progress Notes (Signed)
UR COMPLETED  

## 2012-04-05 NOTE — Progress Notes (Signed)
Physical Therapy Treatment Patient Details Name: Devin Hanna MRN: [id number] DOB: January 26, 1951 Today's Date: 04/05/2012 Time: 3794-4461 PT Time Calculation (min): 23 min  PT Assessment / Plan / Recommendation Comments on Treatment Session  Patient able to progress with ambulation this session and is eagerly wanting to go home today. Still attempting to figure out what equipment patient can borrow and the equipment that he will need to order. When patient getting out of bed, I noted increased about of bloody drainage on the bed and patients dressing, RN was made aware.     Follow Up Recommendations  Home health PT;Supervision/Assistance - 24 hour     Does the patient have the potential to tolerate intense rehabilitation     Barriers to Discharge        Equipment Recommendations  Rolling walker with 5" wheels;Wheelchair (measurements PT);Wheelchair cushion (measurements PT)    Recommendations for Other Services    Frequency Min 5X/week   Plan Discharge plan remains appropriate;Frequency remains appropriate    Precautions / Restrictions Precautions Precautions: Fall Restrictions LLE Weight Bearing: Non weight bearing   Pertinent Vitals/Pain     Mobility  Bed Mobility Supine to Sit: 5: Supervision;HOB flat Sitting - Scoot to Edge of Bed: 5: Supervision Transfers Sit to Stand: 4: Min guard;With upper extremity assist;From bed Stand to Sit: 4: Min guard;With upper extremity assist Details for Transfer Assistance: Cues for safe technique Ambulation/Gait Ambulation/Gait Assistance: 4: Min assist;4: Min guard Ambulation Distance (Feet): 40 Feet Assistive device: Rolling walker Ambulation/Gait Assistance Details: patient with short hops but with good ability to maintain [place].  Gait Pattern: Step-to pattern;Trunk flexed    Exercises     PT Diagnosis:    PT Problem List:   PT Treatment Interventions:     PT Goals Acute Rehab PT Goals PT Goal: Supine/Side to Sit -  Progress: Progressing toward goal PT Goal: Sit to Stand - Progress: Progressing toward goal PT Goal: Stand to Sit - Progress: Progressing toward goal PT Goal: Ambulate - Progress: Progressing toward goal  Visit Information  Last PT Received On: 04/05/12 Assistance Needed: +1    Subjective Data      Cognition  Cognition Overall Cognitive Status: Appears within functional limits for tasks assessed/performed Arousal/Alertness: Awake/alert Orientation Level: Appears intact for tasks assessed Behavior During Session: [facility] for tasks performed    Balance     End of Session PT - End of Session Equipment Utilized During Treatment: Gait belt Activity Tolerance: Patient limited by fatigue;Patient limited by pain Patient left: in chair;with call bell/phone within reach Nurse Communication: Mobility status   GP     Jacqualyn Posey 04/05/2012, 10:32 AM  04/05/2012 Jacqualyn Posey PTA [phone] pager [phone] office

## 2012-04-05 NOTE — Progress Notes (Addendum)
Subjective: 2 Days Post-Op Procedure(s) (LRB): EXTERNAL FIXATION LEG (Left) IRRIGATION AND DEBRIDEMENT KNEE (Left) Patient reports pain as mild.  Up walking with PT today.  Eager to get home.  Objective: Vital signs in last 24 hours: Temp:  [98.6 F (37 C)] 98.6 F (37 C) (02/25 0609) Pulse Rate:  [85-105] 85 (02/25 0609) Resp:  [18] 18 (02/25 0609) BP: (135-136)/(74-76) 135/76 mmHg (02/25 0609) SpO2:  [99 %] 99 % (02/25 0609)  Intake/Output from previous day: 02/24 0701 - 02/25 0700 In: 2746.7 [P.O.:480; I.V.:2266.7] Out: 2140 [Urine:2020; Drains:120] Intake/Output this shift:     Recent Labs  04/04/12 0700  HGB 11.9*    Recent Labs  04/04/12 0700  WBC 13.9*  RBC 3.65*  HCT 33.9*  PLT 177    Recent Labs  04/04/12 0700 04/05/12 0600  NA 134* 136  K 4.4 4.6  CL 98 98  CO2 20 18*  BUN 13 19  CREATININE 0.92 0.87  GLUCOSE 322* 246*  CALCIUM 8.9 9.4     L knee laceration with scant serosang drainage.  Some SS fluid in drain.  Pin sites look fine.  Assessment/Plan: 2 Days Post-Op Procedure(s) (LRB): EXTERNAL FIXATION LEG (Left) IRRIGATION AND DEBRIDEMENT KNEE (Left) Up with therapy  Leave drain in place and hopefully pull in the morning.  Plan to d/c pt home tomorrow and see him back in the office Monday. Started lantus per diabetes coordinator.  Devin Hanna 04/05/2012, 2:24 PM
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
## 2012-04-06 LAB — GLUCOSE, CAPILLARY
Glucose-Capillary: 70 mg/dL (ref 70–99)
Glucose-Capillary: 154 mg/dL — ABNORMAL HIGH (ref 70–99)

## 2012-04-06 LAB — BASIC METABOLIC PANEL
CO2: 20 mEq/L (ref 19–32)
Calcium: 8.8 mg/dL (ref 8.4–10.5)
Chloride: 101 mEq/L (ref 96–112)
Creatinine, Ser: 0.83 mg/dL (ref 0.50–1.35)
Glucose, Bld: 79 mg/dL (ref 70–99)

## 2012-04-06 NOTE — Progress Notes (Signed)
Occupational Therapy Treatment Patient Details Name: Devin Hanna MRN: [id number] DOB: [birth year] Today's Date: 04/06/2012 Time: 1443-1540 OT Time Calculation (min): 17 min  OT Assessment / Plan / Recommendation Comments on Treatment Session Pt making progress and is d/c home with his dtr to his brother's home per pt    Follow Up Recommendations       Barriers to Discharge   none    Equipment Recommendations  3 in 1 bedside comode;Tub/shower seat;Other (comment) (ADL A/E kit)    Recommendations for Other Services    Frequency     Plan      Precautions / Restrictions Precautions Precautions: Fall Required Braces or Orthoses: Other Brace/Splint Other Brace/Splint: external fixator L LE Restrictions Weight Bearing Restrictions: Yes LLE Weight Bearing: Non weight bearing   Pertinent Vitals/Pain     ADL  Grooming: Min guard;Performed Where Assessed - Grooming: Supported standing Toilet Transfer: Performed;Min Psychiatric nurse Method: Sit to stand;Other (comment) (ambulating from RW level) Toilet Transfer Equipment: Raised toilet seat with arms (or 3-in-1 over toilet) Toileting - Clothing Manipulation and Hygiene: Minimal assistance;Moderate assistance Where Assessed - Toileting Clothing Manipulation and Hygiene: Standing ADL Comments: pt 's dtr educated on use of DME and reacher for use at home    OT Diagnosis:    OT Problem List:   OT Treatment Interventions:     OT Goals ADL Goals ADL Goal: Toilet Transfer - Progress: Progressing toward goals ADL Goal: Toileting - Clothing Manipulation - Progress: Progressing toward goals  Visit Information  Last OT Received On: 04/06/12 Assistance Needed: +1    Subjective Data  Subjective: " my daughter is gonna take em tome this afternoon " Patient Stated Goal: To return home   Prior Functioning       Cognition  Cognition Overall Cognitive Status: Appears within functional limits for tasks  assessed/performed Arousal/Alertness: Awake/alert Orientation Level: Appears intact for tasks assessed Behavior During Session: [facility] for tasks performed    Mobility  Bed Mobility Bed Mobility: Sit to Supine Supine to Sit: 5: Supervision;HOB flat Sitting - Scoot to Edge of Bed: 5: Supervision Sit to Supine: HOB flat;5: Supervision Details for Bed Mobility Assistance: A to support L LE during bed mobility.   Transfers Transfers: Sit to Stand Sit to Stand: 4: Min guard;From bed;With upper extremity assist;From chair/3-in-1 Stand to Sit: With upper extremity assist;4: Min guard;To chair/3-in-1;To bed Details for Transfer Assistance: cues for safety, correct hand placement    Exercises      Balance Balance Balance Assessed: No   End of Session OT - End of Session Equipment Utilized During Treatment: Gait belt (RW, 3 in 1) Activity Tolerance: Patient tolerated treatment well Patient left: in bed;with call bell/phone within reach;with family/visitor present  GO     [name] 04/06/2012, 2:48 PM

## 2012-04-06 NOTE — Discharge Summary (Signed)
Physician Discharge Summary  Patient ID: Devin Hanna MRN: [id number] DOB/AGE: [birth year] 62 y.o.  Admit date: 04/03/2012 Discharge date: 04/06/2012  Admission Diagnoses:  Left tibial plateau fracture, left knee laceration  Discharge Diagnoses:  Left tibial plateau fracture s/p closed reduction and ex fix.  L knee laceration s/p I and D and closure  Discharged Condition: stable  Hospital Course: Pt was admitted and taken to the OR on 2/25.  He underwent ex fix and closed reduction of the tibial plateau fracture.  He tolerated the procedure well and did well with PT.  He was started on lantus  int he hospital due to high blood sugars. He declines starting lantus on an outpatient basis.  He will need additional surgeyr on the left knee for definitive fixation.  His knee drain was removed whent he output fell sufficiently.  Consults: None  Significant Diagnostic Studies: xrays, CT  Treatments: surgery: as above.  Discharge Exam: Blood pressure 145/72, pulse 75, temperature 97.6 F (36.4 C), resp. rate 18, height 5' 9"  (1.753 m), weight 102.059 kg (225 lb), SpO2 99.00%. wn wd male in nad.  Ex fix stable.  Pin sites clean.  NVI at left foot.  Disposition: 06-Home-Health Care Svc  Discharge Orders   Future Orders Complete By Expires     Call MD / Call 911  As directed     Comments:      If you experience chest pain or shortness of breath, CALL 911 and be transported to the hospital emergency room.  If you develope a fever above 101 F, pus (white drainage) or increased drainage or redness at the wound, or calf pain, call your surgeon's office.    Constipation Prevention  As directed     Comments:      Drink plenty of fluids.  Prune juice may be helpful.  You may use a stool softener, such as Colace (over the counter) 100 mg twice a day.  Use MiraLax (over the counter) for constipation as needed.    Diet - low sodium heart healthy  As directed     Increase activity slowly as tolerated   As directed         Medication List    STOP taking these medications       aspirin EC 81 MG tablet      TAKE these medications       DSS 100 MG Caps  Take 100 mg by mouth 2 (two) times daily.     furosemide 20 MG tablet  Commonly known as:  LASIX  Take 20 mg by mouth every morning.     glipiZIDE 10 MG 24 hr tablet  Commonly known as:  GLUCOTROL XL  Take 10 mg by mouth 2 (two) times daily.     metFORMIN 500 MG tablet  Commonly known as:  GLUCOPHAGE  Take 1,000 mg by mouth 2 (two) times daily.     metoprolol tartrate 25 MG tablet  Commonly known as:  LOPRESSOR  Take 25 mg by mouth 2 (two) times daily.     multivitamin with minerals Tabs  Take 1 tablet by mouth daily.     oxyCODONE 5 MG immediate release tablet  Commonly known as:  Oxy IR/ROXICODONE  Take 1-2 tablets (5-10 mg total) by mouth every 4 (four) hours as needed.     quinapril 20 MG tablet  Commonly known as:  ACCUPRIL  Take 20 mg by mouth every morning.     rivaroxaban 10 MG Tabs  tablet  Commonly known as:  XARELTO  Take 1 tablet (10 mg total) by mouth daily.     senna 8.6 MG Tabs  Commonly known as:  SENOKOT  Take 2 tablets (17.2 mg total) by mouth 2 (two) times daily.     simvastatin 20 MG tablet  Commonly known as:  ZOCOR  Take 20 mg by mouth every evening.           Follow-up Information   Follow up with Sheridyn Canino, Jenny Reichmann, MD. Schedule an appointment as soon as possible for a visit in 5 days.   Contact information:   [address] [place] [address] [phone]       Follow up with Ronald Lobo, MD. Schedule an appointment as soon as possible for a visit in 1 week. (to monitor blood sugar.)    Contact information:   [address] [place] [address] [phone]       Signed: Wylene Simmer 04/06/2012, 12:32 PM

## 2012-04-06 NOTE — Progress Notes (Addendum)
Inpatient Diabetes Program Recommendations  AACE/ADA: New Consensus Statement on Inpatient Glycemic Control (2013)  Target Ranges:  Prepandial:   less than 140 mg/dL      Peak postprandial:   less than 180 mg/dL (1-2 hours)      Critically ill patients:  140 - 180 mg/dL   Reason for Visit: Results for Devin Hanna, Devin Hanna (MRN [id number]) as of 04/06/2012 10:23  Ref. Range 04/05/2012 11:38 04/05/2012 16:38 04/05/2012 22:44 04/06/2012 06:20 04/06/2012 06:48  Glucose-Capillary Latest Range: 70-99 mg/dL 163 (H) 98 84  87   CBG's improved with the addition of Lantus 20 units.  Discussed insulin with patient. 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 He does have PCP in [place] who follows him for his diabetes.  He has a meter at home.  Instructed him to check CBG's 2 times a day and that if CBG's are running greater than 150 mg/dL to call MD.  Discussed importance of glucose control for wound healing.  He did receive dose of Lantus this morning which lasts for 24 hours.  Also discussed signs and symptoms of low blood glucose and proper treatment.  Patient was able to teach back information regarding hypoglycemia and treatment. He states that his appetite is not good at this time. Discussed with patient and RN.  He will be back next week for surgery.  Will follow.

## 2012-04-06 NOTE — Progress Notes (Signed)
CARE MANAGEMENT NOTE 04/06/2012  Patient:  Devin Hanna, Devin Hanna   Account Number:  [id number]  Date Initiated:  04/05/2012  Documentation initiated by:  Ricki Miller  Subjective/Objective Assessment:   62 yr old male s/p I & D of left tibial plateau fracture.       Anticipated DC Date:  04/06/2012   Anticipated DC Plan:  [place]  CM consult         Comments:  04/06/12 0800 Ricki Miller, RN BSN Case Manager [phone] Patient states he has no insurance and cannot afford to pay out of pocket for home health or DME. States he will borrow from his friends. CM will have nurses explain site care to patient prior to discharge.    Status of service:  Completed, signed off Medicare Important Message given?   (If response is "NO", the following Medicare IM given date fields will be blank) Date Medicare IM given:   Date Additional Medicare IM given:    Discharge Disposition:  HOME/SELF CARE  Per UR Regulation:    If discussed at Long Length of Stay Meetings, dates discussed:

## 2012-04-06 NOTE — Progress Notes (Signed)
Physical Therapy Treatment Patient Details Name: Devin Hanna MRN: [id number] DOB: [birth year] Today's Date: 04/06/2012 Time: 8338-2505 PT Time Calculation (min): 17 min  PT Assessment / Plan / Recommendation Comments on Treatment Session  Patient and daughter educated on precautions, safety and DME at home. Patient discharging today with assistance from his brother and sister in law. Patient going to brothers house since there is no steps to enter. Daughter present and very helpful    Follow Up Recommendations  Home health PT;Supervision/Assistance - 24 hour     Does the patient have the potential to tolerate intense rehabilitation     Barriers to Discharge        Equipment Recommendations  Rolling walker with 5" wheels;Wheelchair (measurements PT);Wheelchair cushion (measurements PT)    Recommendations for Other Services    Frequency Min 5X/week   Plan Discharge plan remains appropriate;Frequency remains appropriate    Precautions / Restrictions Precautions Precautions: Fall Required Braces or Orthoses: Other Brace/Splint Other Brace/Splint: external fixator L LE Restrictions LLE Weight Bearing: Non weight bearing   Pertinent Vitals/Pain no apparent distress     Mobility  Bed Mobility Bed Mobility: Sit to Supine Supine to Sit: 5: Supervision;HOB flat Sitting - Scoot to Edge of Bed: 5: Supervision Sit to Supine: HOB flat;5: Supervision Transfers Sit to Stand: 4: Min guard;From bed;With upper extremity assist;From chair/3-in-1 Stand to Sit: With upper extremity assist;4: Min guard;To chair/3-in-1;To bed Details for Transfer Assistance: cues for safety, correct hand placement Ambulation/Gait Ambulation/Gait Assistance: 4: Min guard Ambulation Distance (Feet): 40 Feet Assistive device: Rolling walker Ambulation/Gait Assistance Details: Cues for safety with management and positioning of RW Gait Pattern: Step-to pattern    Exercises     PT Diagnosis:    PT Problem  List:   PT Treatment Interventions:     PT Goals Acute Rehab PT Goals PT Goal: Supine/Side to Sit - Progress: Progressing toward goal PT Goal: Sit to Supine/Side - Progress: Progressing toward goal PT Goal: Sit to Stand - Progress: Progressing toward goal PT Goal: Stand to Sit - Progress: Progressing toward goal PT Goal: Ambulate - Progress: Progressing toward goal  Visit Information  Last PT Received On: 04/06/12 Assistance Needed: +1    Subjective Data      Cognition  Cognition Overall Cognitive Status: Appears within functional limits for tasks assessed/performed Arousal/Alertness: Awake/alert Orientation Level: Appears intact for tasks assessed Behavior During Session: [facility] for tasks performed    Balance     End of Session PT - End of Session Equipment Utilized During Treatment: Gait belt Activity Tolerance: Patient tolerated treatment well Patient left: in bed;with call bell/phone within reach;with family/visitor present Nurse Communication: Mobility status   GP     Jacqualyn Posey 04/06/2012, 2:04 PM 04/06/2012 Jacqualyn Posey PTA [phone] pager [phone] office

## 2012-04-12 ENCOUNTER — Encounter (HOSPITAL_COMMUNITY): Payer: Self-pay | Admitting: Pharmacy Technician

## 2012-04-13 ENCOUNTER — Encounter (HOSPITAL_COMMUNITY): Payer: Self-pay

## 2012-04-13 ENCOUNTER — Other Ambulatory Visit: Payer: Self-pay | Admitting: Orthopedic Surgery

## 2012-04-13 NOTE — Progress Notes (Signed)
04/13/12 1521  OBSTRUCTIVE SLEEP APNEA  Have you ever been diagnosed with sleep apnea through a sleep study? No  Do you snore loudly (loud enough to be heard through closed doors)?  1  Do you often feel tired, fatigued, or sleepy during the daytime? 0  Has anyone observed you stop breathing during your sleep? 0  Do you have, or are you being treated for high blood pressure? 1  BMI more than 35 kg/m2? 0  Age over 64 years old? 1  Neck circumference greater than 40 cm/18 inches? 0 ("unsure of neck size")  Gender: 1  Obstructive Sleep Apnea Score 4  Score 4 or greater  Results sent to PCP

## 2012-04-14 ENCOUNTER — Ambulatory Visit (HOSPITAL_COMMUNITY): Payer: 59 | Admitting: Anesthesiology

## 2012-04-14 ENCOUNTER — Encounter (HOSPITAL_COMMUNITY): Payer: Self-pay | Admitting: *Deleted

## 2012-04-14 ENCOUNTER — Encounter (HOSPITAL_COMMUNITY): Payer: Self-pay | Admitting: Anesthesiology

## 2012-04-14 ENCOUNTER — Ambulatory Visit (HOSPITAL_COMMUNITY): Payer: 59

## 2012-04-14 ENCOUNTER — Observation Stay (HOSPITAL_COMMUNITY)
Admission: RE | Admit: 2012-04-14 | Discharge: 2012-04-16 | Disposition: A | Discharge status: Home / Self Care | Payer: 59 | Source: Ambulatory Visit | Attending: Orthopedic Surgery | Admitting: Orthopedic Surgery

## 2012-04-14 ENCOUNTER — Encounter (HOSPITAL_COMMUNITY)
Admission: RE | Disposition: A | Discharge status: Home / Self Care | Payer: Self-pay | Source: Ambulatory Visit | Attending: Orthopedic Surgery

## 2012-04-14 DIAGNOSIS — E119 Type 2 diabetes mellitus without complications: Secondary | ICD-10-CM | POA: Insufficient documentation

## 2012-04-14 DIAGNOSIS — S91009A Unspecified open wound, unspecified ankle, initial encounter: Secondary | ICD-10-CM | POA: Insufficient documentation

## 2012-04-14 DIAGNOSIS — S81009A Unspecified open wound, unspecified knee, initial encounter: Secondary | ICD-10-CM | POA: Insufficient documentation

## 2012-04-14 DIAGNOSIS — I2510 Atherosclerotic heart disease of native coronary artery without angina pectoris: Secondary | ICD-10-CM | POA: Insufficient documentation

## 2012-04-14 DIAGNOSIS — Z4789 Encounter for other orthopedic aftercare: Secondary | ICD-10-CM | POA: Insufficient documentation

## 2012-04-14 DIAGNOSIS — S82142D Displaced bicondylar fracture of left tibia, subsequent encounter for closed fracture with routine healing: Secondary | ICD-10-CM

## 2012-04-14 DIAGNOSIS — I10 Essential (primary) hypertension: Secondary | ICD-10-CM | POA: Insufficient documentation

## 2012-04-14 DIAGNOSIS — S82109A Unspecified fracture of upper end of unspecified tibia, initial encounter for closed fracture: Principal | ICD-10-CM | POA: Insufficient documentation

## 2012-04-14 HISTORY — DX: Dizziness and giddiness: R42

## 2012-04-14 HISTORY — DX: Hyperlipidemia, unspecified: E78.5

## 2012-04-14 HISTORY — PX: EXTERNAL FIXATION REMOVAL: SHX5040

## 2012-04-14 HISTORY — PX: ORIF TIBIA PLATEAU: SHX2132

## 2012-04-14 LAB — BASIC METABOLIC PANEL
Chloride: 97 mEq/L (ref 96–112)
GFR calc Af Amer: >90 mL/min (ref >90)
Potassium: 4.2 mEq/L (ref 3.5–5.1)

## 2012-04-14 LAB — CBC
HCT: 34.9 % — ABNORMAL LOW (ref 39.0–52.0)
HCT: 37.4 % — ABNORMAL LOW (ref 39.0–52.0)
Hemoglobin: 12.5 g/dL — ABNORMAL LOW (ref 13.0–17.0)
MCH: 32.1 pg (ref 26.0–34.0)
MCV: 90.3 fL (ref 78.0–100.0)
Platelets: 313 10*3/uL (ref 150–400)
RBC: 3.81 MIL/uL — ABNORMAL LOW (ref 4.22–5.81)
RBC: 4.14 MIL/uL — ABNORMAL LOW (ref 4.22–5.81)
WBC: 11 10*3/uL — ABNORMAL HIGH (ref 4.0–10.5)

## 2012-04-14 LAB — CREATININE, SERUM: GFR calc Af Amer: >90 mL/min (ref >90)

## 2012-04-14 LAB — GLUCOSE, CAPILLARY
Glucose-Capillary: 110 mg/dL — ABNORMAL HIGH (ref 70–99)
Glucose-Capillary: 117 mg/dL — ABNORMAL HIGH (ref 70–99)
Glucose-Capillary: 140 mg/dL — ABNORMAL HIGH (ref 70–99)

## 2012-04-14 LAB — SURGICAL PCR SCREEN: Staphylococcus aureus: POSITIVE — AB

## 2012-04-14 SURGERY — OPEN REDUCTION INTERNAL FIXATION (ORIF) TIBIAL PLATEAU
Anesthesia: General | Site: Leg Lower | Laterality: Left | Wound class: Clean

## 2012-04-14 MED ORDER — BUPIVACAINE HCL (PF) 0.25 % IJ SOLN
INTRAMUSCULAR | Status: AC
  Filled 2012-04-14: qty 30

## 2012-04-14 MED ORDER — LACTATED RINGERS IV SOLN
INTRAVENOUS | Status: DC
  Administered 2012-04-14: 12:00:00 via INTRAVENOUS

## 2012-04-14 MED ORDER — HYDROMORPHONE HCL PF 1 MG/ML IJ SOLN
0.5000 mg | INTRAMUSCULAR | Status: DC | PRN
  Administered 2012-04-14: 1 mg via INTRAVENOUS
  Filled 2012-04-14: qty 1

## 2012-04-14 MED ORDER — PROMETHAZINE HCL 25 MG/ML IJ SOLN: 6.2500 mg | INTRAMUSCULAR | Status: DC | PRN

## 2012-04-14 MED ORDER — CEFAZOLIN SODIUM-DEXTROSE 2-3 GM-% IV SOLR
2.0000 g | Freq: Four times a day (QID) | INTRAVENOUS | Status: AC
  Administered 2012-04-14 – 2012-04-15 (×3): 2 g via INTRAVENOUS
  Filled 2012-04-14 (×3): qty 50

## 2012-04-14 MED ORDER — HYDROMORPHONE HCL PF 1 MG/ML IJ SOLN
INTRAMUSCULAR | Status: AC
  Filled 2012-04-14: qty 2

## 2012-04-14 MED ORDER — OXYCODONE HCL 5 MG PO TABS
5.0000 mg | ORAL_TABLET | ORAL | Status: DC | PRN
  Administered 2012-04-14 – 2012-04-15 (×4): 10 mg via ORAL
  Filled 2012-04-14 (×4): qty 2

## 2012-04-14 MED ORDER — ARTIFICIAL TEARS OP OINT
TOPICAL_OINTMENT | OPHTHALMIC | Status: DC | PRN
  Administered 2012-04-14: 1 via OPHTHALMIC

## 2012-04-14 MED ORDER — DOCUSATE SODIUM 100 MG PO CAPS
100.0000 mg | ORAL_CAPSULE | Freq: Two times a day (BID) | ORAL | Status: DC
  Administered 2012-04-14: 100 mg via ORAL
  Filled 2012-04-14 (×5): qty 1

## 2012-04-14 MED ORDER — ONDANSETRON HCL 4 MG/2ML IJ SOLN
INTRAMUSCULAR | Status: DC | PRN
  Administered 2012-04-14: 4 mg via INTRAVENOUS

## 2012-04-14 MED ORDER — OXYCODONE HCL 5 MG/5ML PO SOLN: 5.0000 mg | Freq: Once | ORAL | Status: DC | PRN

## 2012-04-14 MED ORDER — BUPIVACAINE HCL (PF) 0.25 % IJ SOLN
INTRAMUSCULAR | Status: DC | PRN
  Administered 2012-04-14: 27 mL

## 2012-04-14 MED ORDER — LIDOCAINE HCL (CARDIAC) 20 MG/ML IV SOLN
INTRAVENOUS | Status: DC | PRN
  Administered 2012-04-14: 100 mg via INTRAVENOUS

## 2012-04-14 MED ORDER — ROCURONIUM BROMIDE 100 MG/10ML IV SOLN
INTRAVENOUS | Status: DC | PRN
  Administered 2012-04-14: 50 mg via INTRAVENOUS

## 2012-04-14 MED ORDER — NEOSTIGMINE METHYLSULFATE 1 MG/ML IJ SOLN
INTRAMUSCULAR | Status: DC | PRN
  Administered 2012-04-14: 5 mg via INTRAVENOUS

## 2012-04-14 MED ORDER — SENNA 8.6 MG PO TABS
2.0000 | ORAL_TABLET | Freq: Two times a day (BID) | ORAL | Status: DC
  Administered 2012-04-14: 17.2 mg via ORAL
  Filled 2012-04-14 (×5): qty 2

## 2012-04-14 MED ORDER — CEFAZOLIN SODIUM-DEXTROSE 2-3 GM-% IV SOLR
2.0000 g | INTRAVENOUS | Status: AC
  Administered 2012-04-14: 2 g via INTRAVENOUS

## 2012-04-14 MED ORDER — SODIUM CHLORIDE 0.9 % IV SOLN
INTRAVENOUS | Status: DC
  Administered 2012-04-14 – 2012-04-15 (×2): via INTRAVENOUS

## 2012-04-14 MED ORDER — BACITRACIN ZINC 500 UNIT/GM EX OINT
TOPICAL_OINTMENT | CUTANEOUS | Status: DC | PRN
  Administered 2012-04-14: 1 via TOPICAL

## 2012-04-14 MED ORDER — PNEUMOCOCCAL VAC POLYVALENT 25 MCG/0.5ML IJ INJ
0.5000 mL | INJECTION | INTRAMUSCULAR | Status: DC
  Filled 2012-04-14: qty 0.5

## 2012-04-14 MED ORDER — FENTANYL CITRATE 0.05 MG/ML IJ SOLN
INTRAMUSCULAR | Status: DC | PRN
  Administered 2012-04-14 (×7): 50 ug via INTRAVENOUS
  Administered 2012-04-14: 150 ug via INTRAVENOUS

## 2012-04-14 MED ORDER — ONDANSETRON HCL 4 MG/2ML IJ SOLN: 4.0000 mg | Freq: Four times a day (QID) | INTRAMUSCULAR | Status: DC | PRN

## 2012-04-14 MED ORDER — ACETAMINOPHEN 10 MG/ML IV SOLN
INTRAVENOUS | Status: DC | PRN
  Administered 2012-04-14: 1000 mg via INTRAVENOUS

## 2012-04-14 MED ORDER — PROPOFOL 10 MG/ML IV BOLUS
INTRAVENOUS | Status: DC | PRN
  Administered 2012-04-14: 60 mg via INTRAVENOUS
  Administered 2012-04-14: 140 mg via INTRAVENOUS

## 2012-04-14 MED ORDER — OXYCODONE HCL 5 MG PO TABS: 5.0000 mg | ORAL_TABLET | Freq: Once | ORAL | Status: DC | PRN

## 2012-04-14 MED ORDER — CEFAZOLIN SODIUM-DEXTROSE 2-3 GM-% IV SOLR
INTRAVENOUS | Status: AC
  Filled 2012-04-14: qty 50

## 2012-04-14 MED ORDER — ACETAMINOPHEN 10 MG/ML IV SOLN
INTRAVENOUS | Status: AC
  Filled 2012-04-14: qty 100

## 2012-04-14 MED ORDER — MIDAZOLAM HCL 5 MG/5ML IJ SOLN
INTRAMUSCULAR | Status: DC | PRN
  Administered 2012-04-14: 1 mg via INTRAVENOUS

## 2012-04-14 MED ORDER — HYDROMORPHONE HCL PF 1 MG/ML IJ SOLN
0.2500 mg | INTRAMUSCULAR | Status: DC | PRN
  Administered 2012-04-14 (×4): 0.5 mg via INTRAVENOUS

## 2012-04-14 MED ORDER — SODIUM CHLORIDE 0.9 % IV SOLN: INTRAVENOUS | Status: DC

## 2012-04-14 MED ORDER — CHLORHEXIDINE GLUCONATE 4 % EX LIQD: 60.0000 mL | Freq: Once | CUTANEOUS | Status: DC

## 2012-04-14 MED ORDER — ENOXAPARIN SODIUM 40 MG/0.4ML ~~LOC~~ SOLN
40.0000 mg | SUBCUTANEOUS | Status: DC
  Administered 2012-04-15 – 2012-04-16 (×2): 40 mg via SUBCUTANEOUS
  Filled 2012-04-14 (×3): qty 0.4

## 2012-04-14 MED ORDER — INSULIN ASPART 100 UNIT/ML ~~LOC~~ SOLN
0.0000 [IU] | Freq: Three times a day (TID) | SUBCUTANEOUS | Status: DC
Start: 2012-04-14 — End: 2012-04-16
  Administered 2012-04-14 – 2012-04-15 (×3): 2 [IU] via SUBCUTANEOUS
  Administered 2012-04-15 – 2012-04-16 (×2): 3 [IU] via SUBCUTANEOUS

## 2012-04-14 MED ORDER — GLYCOPYRROLATE 0.2 MG/ML IJ SOLN
INTRAMUSCULAR | Status: DC | PRN
  Administered 2012-04-14: .8 mg via INTRAVENOUS

## 2012-04-14 MED ORDER — BACITRACIN ZINC 500 UNIT/GM EX OINT
TOPICAL_OINTMENT | CUTANEOUS | Status: AC
  Filled 2012-04-14: qty 15

## 2012-04-14 MED ORDER — MUPIROCIN 2 % EX OINT
TOPICAL_OINTMENT | CUTANEOUS | Status: AC
  Administered 2012-04-14: 1 via NASAL
  Filled 2012-04-14: qty 22

## 2012-04-14 MED ORDER — 0.9 % SODIUM CHLORIDE (POUR BTL) OPTIME
TOPICAL | Status: DC | PRN
  Administered 2012-04-14 (×2): 1000 mL

## 2012-04-14 MED ORDER — LACTATED RINGERS IV SOLN
INTRAVENOUS | Status: DC | PRN
  Administered 2012-04-14 (×2): via INTRAVENOUS

## 2012-04-14 MED ORDER — ONDANSETRON HCL 4 MG PO TABS: 4.0000 mg | ORAL_TABLET | Freq: Four times a day (QID) | ORAL | Status: DC | PRN

## 2012-04-14 SURGICAL SUPPLY — 62 items
BANDAGE ESMARK 6X9 LF (GAUZE/BANDAGES/DRESSINGS) ×1 IMPLANT
BIT DRILL 2.5X2.75 QC CALB (BIT) ×2 IMPLANT
BIT DRILL CALIBRATED 2.7 (BIT) ×2 IMPLANT
BLADE SURG 10 STRL SS (BLADE) ×2 IMPLANT
BNDG ELASTIC 6X15 VLCR STRL LF (GAUZE/BANDAGES/DRESSINGS) ×2 IMPLANT
BNDG ESMARK 6X9 LF (GAUZE/BANDAGES/DRESSINGS) ×2
CLOTH BEACON ORANGE TIMEOUT ST (SAFETY) ×2 IMPLANT
COVER MAYO STAND STRL (DRAPES) ×2 IMPLANT
COVER SURGICAL LIGHT HANDLE (MISCELLANEOUS) ×2 IMPLANT
CUFF TOURNIQUET SINGLE 34IN LL (TOURNIQUET CUFF) ×2 IMPLANT
DRAIN JACKSON RD 7FR 3/32 (WOUND CARE) ×2 IMPLANT
DRAPE C-ARM 42X72 X-RAY (DRAPES) ×2 IMPLANT
DRAPE C-ARMOR (DRAPES) ×2 IMPLANT
DRAPE U-SHAPE 47X51 STRL (DRAPES) ×2 IMPLANT
DRSG ADAPTIC 3X8 NADH LF (GAUZE/BANDAGES/DRESSINGS) ×2 IMPLANT
DRSG PAD ABDOMINAL 8X10 ST (GAUZE/BANDAGES/DRESSINGS) ×2 IMPLANT
DURAPREP 26ML APPLICATOR (WOUND CARE) ×2 IMPLANT
ELECT REM PT RETURN 9FT ADLT (ELECTROSURGICAL) ×2
ELECTRODE REM PT RTRN 9FT ADLT (ELECTROSURGICAL) ×1 IMPLANT
EVACUATOR SILICONE 100CC (DRAIN) ×2 IMPLANT
GLOVE BIO SURGEON STRL SZ8 (GLOVE) ×2 IMPLANT
GLOVE BIOGEL PI IND STRL 8 (GLOVE) ×1 IMPLANT
GLOVE BIOGEL PI INDICATOR 8 (GLOVE) ×1
GOWN PREVENTION PLUS XLARGE (GOWN DISPOSABLE) ×2 IMPLANT
GOWN STRL NON-REIN LRG LVL3 (GOWN DISPOSABLE) ×4 IMPLANT
GOWN STRL REIN 2XL XLG LVL4 (GOWN DISPOSABLE) ×2 IMPLANT
K-WIRE ACE 1.6X6 (WIRE) ×2
KIT BASIN OR (CUSTOM PROCEDURE TRAY) ×2 IMPLANT
KIT ROOM TURNOVER OR (KITS) ×2 IMPLANT
KWIRE ACE 1.6X6 (WIRE) ×1 IMPLANT
NEEDLE 22X1 1/2 (OR ONLY) (NEEDLE) ×2 IMPLANT
NS IRRIG 1000ML POUR BTL (IV SOLUTION) ×2 IMPLANT
PACK ORTHO EXTREMITY (CUSTOM PROCEDURE TRAY) ×2 IMPLANT
PAD ARMBOARD 7.5X6 YLW CONV (MISCELLANEOUS) ×2 IMPLANT
PADDING CAST COTTON 6X4 STRL (CAST SUPPLIES) ×4 IMPLANT
PLATE LOCK 5H STD LT PROX TIB (Plate) ×2 IMPLANT
SCREW CORT FT 32X3.5XNONLOCK (Screw) ×1 IMPLANT
SCREW CORTICAL 3.5MM  32MM (Screw) ×1 IMPLANT
SCREW CORTICAL 3.5MM  34MM (Screw) ×1 IMPLANT
SCREW CORTICAL 3.5MM 34MM (Screw) ×1 IMPLANT
SCREW CORTICAL 3.5MM 40MM (Screw) ×2 IMPLANT
SCREW LOCK CORT STAR 3.5X65 (Screw) ×2 IMPLANT
SCREW LOCK CORT STAR 3.5X70 (Screw) ×2 IMPLANT
SCREW LOCK CORT STAR 3.5X75 (Screw) ×2 IMPLANT
SCREW LOCK CORT STAR 3.5X80 (Screw) ×4 IMPLANT
SCREW LOW PROF CORTICAL 3.5X80 (Screw) ×2 IMPLANT
SCREW LP 3.5X75MM (Screw) ×2 IMPLANT
SPONGE GAUZE 4X4 12PLY (GAUZE/BANDAGES/DRESSINGS) ×2 IMPLANT
SPONGE LAP 18X18 X RAY DECT (DISPOSABLE) ×2 IMPLANT
STOCKINETTE IMPERVIOUS 9X36 MD (GAUZE/BANDAGES/DRESSINGS) ×2 IMPLANT
SUCTION FRAZIER TIP 10 FR DISP (SUCTIONS) ×2 IMPLANT
SUT ETHILON 3 0 PS 1 (SUTURE) ×2 IMPLANT
SUT MNCRL AB 3-0 PS2 18 (SUTURE) ×2 IMPLANT
SUT VIC AB 0 CT2 27 (SUTURE) ×2 IMPLANT
SUT VIC AB 2-0 CT1 27 (SUTURE) ×1
SUT VIC AB 2-0 CT1 TAPERPNT 27 (SUTURE) ×1 IMPLANT
SYR CONTROL 10ML LL (SYRINGE) ×2 IMPLANT
TOWEL OR 17X24 6PK STRL BLUE (TOWEL DISPOSABLE) ×2 IMPLANT
TOWEL OR 17X26 10 PK STRL BLUE (TOWEL DISPOSABLE) ×2 IMPLANT
TUBE CONNECTING 12X1/4 (SUCTIONS) ×2 IMPLANT
WATER STERILE IRR 1000ML POUR (IV SOLUTION) ×2 IMPLANT
YANKAUER SUCT BULB TIP NO VENT (SUCTIONS) ×2 IMPLANT

## 2012-04-14 NOTE — Anesthesia Postprocedure Evaluation (Signed)
Anesthesia Post Note  Patient: Devin Hanna  Procedure(s) Performed: Procedure(s) (LRB): OPEN REDUCTION INTERNAL FIXATION (ORIF) TIBIAL PLATEAU (Left) REMOVAL EXTERNAL FIXATION LEG (Left)  Anesthesia type: general  Patient location: PACU  Post pain: Pain level controlled  Post assessment: Patient's Cardiovascular Status Stable  Last Vitals:  Filed Vitals:   04/14/12 1600  BP: 129/68  Pulse: 87  Temp: 36.4 C  Resp: 14    Post vital signs: Reviewed and stable  Level of consciousness: sedated  Complications: No apparent anesthesia complications

## 2012-04-14 NOTE — Anesthesia Procedure Notes (Signed)
Procedure Name: Intubation Date/Time: 04/14/2012 12:21 PM Performed by: Carney Living Pre-anesthesia Checklist: Emergency Drugs available, Suction available, Patient being monitored, Timeout performed and Patient identified Patient Re-evaluated:Patient Re-evaluated prior to inductionOxygen Delivery Method: Circle system utilized Preoxygenation: Pre-oxygenation with 100% oxygen Intubation Type: IV induction Ventilation: Mask ventilation without difficulty Laryngoscope Size: Mac and 4 Grade View: Grade I Tube type: Oral Number of attempts: 1 Airway Equipment and Method: Stylet Placement Confirmation: ETT inserted through vocal cords under direct vision,  positive ETCO2 and breath sounds checked- equal and bilateral Secured at: 21 cm Tube secured with: Tape Dental Injury: Teeth and Oropharynx as per pre-operative assessment
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
## 2012-04-14 NOTE — Preoperative (Signed)
Beta Blockers   Reason not to administer Beta Blockers:Lopressor taken 04/14/12

## 2012-04-14 NOTE — Transfer of Care (Signed)
Immediate Anesthesia Transfer of Care Note  Patient: Devin Hanna  Procedure(s) Performed: Procedure(s): OPEN REDUCTION INTERNAL FIXATION (ORIF) TIBIAL PLATEAU (Left) REMOVAL EXTERNAL FIXATION LEG (Left)  Patient Location: PACU  Anesthesia Type:General  Level of Consciousness: awake and oriented  Airway & Oxygen Therapy: Patient Spontanous Breathing and Patient connected to nasal cannula oxygen  Post-op Assessment: Report given to PACU RN and Post -op Vital signs reviewed and stable  Post vital signs: Reviewed and stable  Complications: No apparent anesthesia complications

## 2012-04-14 NOTE — Anesthesia Preprocedure Evaluation (Signed)
Anesthesia Evaluation  Patient identified by MRN, date of birth, ID band Patient awake    Reviewed: Allergy & Precautions, H&P , NPO status , Patient's Chart, lab work & pertinent test results  History of Anesthesia Complications Negative for: history of anesthetic complications  Airway  TM Distance: >3 FB Neck ROM: Full    Dental  (+) Edentulous Upper, Edentulous Lower and Dental Advisory Given   Pulmonary former smoker,    Pulmonary exam normal       Cardiovascular hypertension, Pt. on home beta blockers + Past MI     Neuro/Psych negative neurological ROS     GI/Hepatic negative GI ROS, Neg liver ROS, GERD-  Controlled,  Endo/Other  diabetes  Renal/GU negative Renal ROS     Musculoskeletal   Abdominal   Peds  Hematology   Anesthesia Other Findings   Reproductive/Obstetrics                           Anesthesia Physical Anesthesia Plan  ASA: III  Anesthesia Plan: General   Post-op Pain Management:    Induction: Intravenous  Airway Management Planned: LMA and Oral ETT  Additional Equipment:   Intra-op Plan:   Post-operative Plan: Extubation in OR  Informed Consent: I have reviewed the patients History and Physical, chart, labs and discussed the procedure including the risks, benefits and alternatives for the proposed anesthesia with the patient or authorized representative who has indicated his/her understanding and acceptance.   Dental advisory given  Plan Discussed with: CRNA, Anesthesiologist and Surgeon  Anesthesia Plan Comments:         Anesthesia Quick Evaluation

## 2012-04-14 NOTE — Brief Op Note (Signed)
04/14/2012  2:42 PM  PATIENT:  Devin Hanna  62 y.o. male  PRE-OPERATIVE DIAGNOSIS:  LEFT TIBIAL PLATEAU FRACTURE s/p closed reduction and application of external fixator  POST-OPERATIVE DIAGNOSIS:  same  Procedure(s): 1.  ORIF left tibial plateau fracture (bicondylar) 2.  Removal of left lower extremity external fixator  SURGEON:  Wylene Simmer, MD  ASSISTANT: n/a  ANESTHESIA:   General  EBL:  minimal   TOURNIQUET:  approx 90 min at 561 mm Hg  COMPLICATIONS:  None apparent  DISPOSITION:  Extubated, awake and stable to recovery.  DICTATION ID:  [id number]

## 2012-04-14 NOTE — Progress Notes (Signed)
Orthopedic Tech Progress Note Patient Details:  Devin Hanna [birth year] [id number] Called [employer] for brace order. Patient ID: Devin Hanna, male   DOB: 04-Jun-1950, 62 y.o.   MRN: [id number]   Braulio Bosch 04/14/2012, 4:22 PM

## 2012-04-15 LAB — GLUCOSE, CAPILLARY
Glucose-Capillary: 147 mg/dL — ABNORMAL HIGH (ref 70–99)
Glucose-Capillary: 164 mg/dL — ABNORMAL HIGH (ref 70–99)
Glucose-Capillary: 160 mg/dL — ABNORMAL HIGH (ref 70–99)
Glucose-Capillary: 159 mg/dL — ABNORMAL HIGH (ref 70–99)

## 2012-04-15 MED ORDER — RIVAROXABAN 10 MG PO TABS: 10.0000 mg | ORAL_TABLET | Freq: Every day | ORAL | Status: DC

## 2012-04-15 MED ORDER — OXYCODONE HCL 5 MG PO TABS: 5.0000 mg | ORAL_TABLET | ORAL | Status: DC | PRN

## 2012-04-15 NOTE — Discharge Summary (Signed)
Physician Discharge Summary  Patient ID: Devin Hanna MRN: [id number] DOB/AGE: Apr 12, 1950 62 y.o.  Admit date: 04/14/2012 Discharge date: 04/15/2012  Admission Diagnoses:  Diabetes, CAD, htn, left tibial plateau fracture s/p closed reduction and ex fix  Discharge Diagnoses:  Same S/p removal of ex fix and ORIF of left tibial plateau fracture  Discharged Condition: stable  Hospital Course: Pt was admitted yesterday and taken tot he OR for ORIF of his left tibial plateau fracture.  He tolerated the  Procedure well and returned to 5N where he remained for the duration of his stay.  He had a long leg ROM brace fit and the drain removed.  He is discharged home in stable condition.  Consults: None  Significant Diagnostic Studies: none  Treatments: surgery: as above  Discharge Exam: Blood pressure 119/71, pulse 89, temperature 98.9 F (37.2 C), temperature source Oral, resp. rate 18, height 5' 9"  (1.753 m), weight 99.791 kg (220 lb), SpO2 98.00%. L LE immobiilzed in knee immobilizer.  NVI at L foot.  Dressings dry.  Drain removed.  Disposition: 06-Home-Health Care Svc  Discharge Orders   Future Orders Complete By Expires     Call MD / Call 911  As directed     Comments:      If you experience chest pain or shortness of breath, CALL 911 and be transported to the hospital emergency room.  If you develope a fever above 101 F, pus (white drainage) or increased drainage or redness at the wound, or calf pain, call your surgeon's office.    Constipation Prevention  As directed     Comments:      Drink plenty of fluids.  Prune juice may be helpful.  You may use a stool softener, such as Colace (over the counter) 100 mg twice a day.  Use MiraLax (over the counter) for constipation as needed.    Diet - low sodium heart healthy  As directed     Increase activity slowly as tolerated  As directed         Medication List    TAKE these medications       furosemide 20 MG tablet  Commonly known  as:  LASIX  Take 20 mg by mouth daily as needed. Takes if he has increased edema     glipiZIDE 10 MG 24 hr tablet  Commonly known as:  GLUCOTROL XL  Take 10 mg by mouth 2 (two) times daily.     metFORMIN 500 MG tablet  Commonly known as:  GLUCOPHAGE  Take 1,000 mg by mouth 2 (two) times daily.     metoprolol tartrate 25 MG tablet  Commonly known as:  LOPRESSOR  Take 25 mg by mouth 2 (two) times daily.     multivitamin with minerals Tabs  Take 1 tablet by mouth daily.     oxyCODONE 5 MG immediate release tablet  Commonly known as:  Oxy IR/ROXICODONE  Take 1-2 tablets (5-10 mg total) by mouth every 4 (four) hours as needed for pain.     quinapril 20 MG tablet  Commonly known as:  ACCUPRIL  Take 20 mg by mouth every morning.     rivaroxaban 10 MG Tabs tablet  Commonly known as:  XARELTO  Take 1 tablet (10 mg total) by mouth daily.     simvastatin 20 MG tablet  Commonly known as:  ZOCOR  Take 20 mg by mouth every evening.           Follow-up Information  Follow up with HEWITT, Jenny Reichmann, MD. Schedule an appointment as soon as possible for a visit in 2 weeks.   Contact information:   [address] [place] [address] [place] [address] [phone]      Pt will be dishcharged home after his brace is fit and he can get a ride [place]  SignedWylene Simmer 04/15/2012, 9:42 AM
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
## 2012-04-15 NOTE — Progress Notes (Signed)
UR COMPLETED  

## 2012-04-16 LAB — GLUCOSE, CAPILLARY: Glucose-Capillary: 153 mg/dL — ABNORMAL HIGH (ref 70–99)

## 2012-04-18 ENCOUNTER — Encounter (HOSPITAL_COMMUNITY): Payer: Self-pay | Admitting: Orthopedic Surgery

## 2013-04-04 ENCOUNTER — Encounter (HOSPITAL_COMMUNITY): Payer: Self-pay | Admitting: Emergency Medicine

## 2013-04-04 ENCOUNTER — Other Ambulatory Visit: Payer: Self-pay

## 2013-04-04 ENCOUNTER — Inpatient Hospital Stay (HOSPITAL_COMMUNITY)
Admission: EM | Admit: 2013-04-04 | Discharge: 2013-04-07 | DRG: 246 | Disposition: A | Discharge status: Home / Self Care | Payer: BC Managed Care – PPO | Attending: Internal Medicine | Admitting: Internal Medicine

## 2013-04-04 ENCOUNTER — Emergency Department (HOSPITAL_COMMUNITY): Payer: BC Managed Care – PPO

## 2013-04-04 DIAGNOSIS — J9600 Acute respiratory failure, unspecified whether with hypoxia or hypercapnia: Secondary | ICD-10-CM | POA: Diagnosis present

## 2013-04-04 DIAGNOSIS — I2510 Atherosclerotic heart disease of native coronary artery without angina pectoris: Secondary | ICD-10-CM | POA: Diagnosis present

## 2013-04-04 DIAGNOSIS — I509 Heart failure, unspecified: Secondary | ICD-10-CM | POA: Diagnosis present

## 2013-04-04 DIAGNOSIS — I10 Essential (primary) hypertension: Secondary | ICD-10-CM | POA: Diagnosis present

## 2013-04-04 DIAGNOSIS — Z8249 Family history of ischemic heart disease and other diseases of the circulatory system: Secondary | ICD-10-CM

## 2013-04-04 DIAGNOSIS — I517 Cardiomegaly: Secondary | ICD-10-CM

## 2013-04-04 DIAGNOSIS — Z79899 Other long term (current) drug therapy: Secondary | ICD-10-CM | POA: Diagnosis not present

## 2013-04-04 DIAGNOSIS — T82897A Other specified complication of cardiac prosthetic devices, implants and grafts, initial encounter: Secondary | ICD-10-CM | POA: Diagnosis present

## 2013-04-04 DIAGNOSIS — E785 Hyperlipidemia, unspecified: Secondary | ICD-10-CM | POA: Diagnosis present

## 2013-04-04 DIAGNOSIS — Z9861 Coronary angioplasty status: Secondary | ICD-10-CM

## 2013-04-04 DIAGNOSIS — J811 Chronic pulmonary edema: Secondary | ICD-10-CM | POA: Diagnosis present

## 2013-04-04 DIAGNOSIS — I219 Acute myocardial infarction, unspecified: Secondary | ICD-10-CM | POA: Diagnosis present

## 2013-04-04 DIAGNOSIS — E119 Type 2 diabetes mellitus without complications: Secondary | ICD-10-CM | POA: Diagnosis present

## 2013-04-04 DIAGNOSIS — Z8674 Personal history of sudden cardiac arrest: Secondary | ICD-10-CM

## 2013-04-04 DIAGNOSIS — Y831 Surgical operation with implant of artificial internal device as the cause of abnormal reaction of the patient, or of later complication, without mention of misadventure at the time of the procedure: Secondary | ICD-10-CM | POA: Diagnosis present

## 2013-04-04 DIAGNOSIS — E669 Obesity, unspecified: Secondary | ICD-10-CM | POA: Diagnosis present

## 2013-04-04 DIAGNOSIS — I252 Old myocardial infarction: Secondary | ICD-10-CM

## 2013-04-04 DIAGNOSIS — I214 Non-ST elevation (NSTEMI) myocardial infarction: Principal | ICD-10-CM | POA: Diagnosis present

## 2013-04-04 DIAGNOSIS — I5021 Acute systolic (congestive) heart failure: Secondary | ICD-10-CM | POA: Diagnosis present

## 2013-04-04 DIAGNOSIS — Z6841 Body Mass Index (BMI) 40.0 and over, adult: Secondary | ICD-10-CM

## 2013-04-04 DIAGNOSIS — J9601 Acute respiratory failure with hypoxia: Secondary | ICD-10-CM

## 2013-04-04 DIAGNOSIS — I2584 Coronary atherosclerosis due to calcified coronary lesion: Secondary | ICD-10-CM | POA: Diagnosis present

## 2013-04-04 DIAGNOSIS — I447 Left bundle-branch block, unspecified: Secondary | ICD-10-CM | POA: Diagnosis present

## 2013-04-04 LAB — COMPREHENSIVE METABOLIC PANEL
ALK PHOS: 129 U/L — AB (ref 39–117)
ALT: 43 U/L (ref 0–53)
AST: 49 U/L — ABNORMAL HIGH (ref 0–37)
Albumin: 3.3 g/dL — ABNORMAL LOW (ref 3.5–5.2)
BUN: 10 mg/dL (ref 6–23)
CO2: 23 mEq/L (ref 19–32)
Calcium: 9.2 mg/dL (ref 8.4–10.5)
Chloride: 97 mEq/L (ref 96–112)
Creatinine, Ser: 0.84 mg/dL (ref 0.50–1.35)
GFR calc non Af Amer: 90 mL/min — ABNORMAL LOW (ref >90)
GLUCOSE: 313 mg/dL — AB (ref 70–99)
POTASSIUM: 4 meq/L (ref 3.7–5.3)
SODIUM: 139 meq/L (ref 137–147)
TOTAL PROTEIN: 7.3 g/dL (ref 6.0–8.3)
Total Bilirubin: 0.6 mg/dL (ref 0.3–1.2)

## 2013-04-04 LAB — URINALYSIS, ROUTINE W REFLEX MICROSCOPIC
BILIRUBIN URINE: NEGATIVE
GLUCOSE, UA: 250 mg/dL — AB
Hgb urine dipstick: NEGATIVE
KETONES UR: 15 mg/dL — AB
Leukocytes, UA: NEGATIVE
NITRITE: POSITIVE — AB
PH: 6 (ref 5.0–8.0)
Protein, ur: NEGATIVE mg/dL
Specific Gravity, Urine: 1.012 (ref 1.005–1.030)
Urobilinogen, UA: 0.2 mg/dL (ref 0.0–1.0)

## 2013-04-04 LAB — HEPARIN LEVEL (UNFRACTIONATED)
Heparin Unfractionated: <0.1 IU/mL — ABNORMAL LOW (ref 0.30–0.70)
Heparin Unfractionated: 0.15 IU/mL — ABNORMAL LOW (ref 0.30–0.70)

## 2013-04-04 LAB — APTT: aPTT: 32 seconds (ref 24–37)

## 2013-04-04 LAB — GLUCOSE, CAPILLARY
GLUCOSE-CAPILLARY: 233 mg/dL — AB (ref 70–99)
Glucose-Capillary: 205 mg/dL — ABNORMAL HIGH (ref 70–99)
Glucose-Capillary: 193 mg/dL — ABNORMAL HIGH (ref 70–99)

## 2013-04-04 LAB — TSH: TSH: 3.056 u[IU]/mL (ref 0.350–4.500)

## 2013-04-04 LAB — CBC WITH DIFFERENTIAL/PLATELET
Basophils Absolute: 0 10*3/uL (ref 0.0–0.1)
Basophils Relative: 0 % (ref 0–1)
EOS ABS: 0.2 10*3/uL (ref 0.0–0.7)
Eosinophils Relative: 2 % (ref 0–5)
HCT: 45.1 % (ref 39.0–52.0)
Hemoglobin: 15.2 g/dL (ref 13.0–17.0)
LYMPHS ABS: 2.9 10*3/uL (ref 0.7–4.0)
LYMPHS PCT: 30 % (ref 12–46)
MCH: 31.9 pg (ref 26.0–34.0)
MCHC: 33.7 g/dL (ref 30.0–36.0)
MCV: 94.5 fL (ref 78.0–100.0)
Monocytes Absolute: 0.7 10*3/uL (ref 0.1–1.0)
Monocytes Relative: 7 % (ref 3–12)
NEUTROS ABS: 5.9 10*3/uL (ref 1.7–7.7)
NEUTROS PCT: 61 % (ref 43–77)
PLATELETS: 198 10*3/uL (ref 150–400)
RBC: 4.77 MIL/uL (ref 4.22–5.81)
RDW: 13.8 % (ref 11.5–15.5)
WBC: 9.7 10*3/uL (ref 4.0–10.5)

## 2013-04-04 LAB — URINE MICROSCOPIC-ADD ON

## 2013-04-04 LAB — PROTIME-INR
INR: 1.08 (ref 0.00–1.49)
Prothrombin Time: 13.8 seconds (ref 11.6–15.2)

## 2013-04-04 LAB — PRO B NATRIURETIC PEPTIDE: PRO B NATRI PEPTIDE: 272.5 pg/mL — AB (ref 0–125)

## 2013-04-04 LAB — TROPONIN I
TROPONIN I: 0.85 ng/mL — AB (ref <0.30)
Troponin I: 1.18 ng/mL (ref <0.30)
Troponin I: 2.25 ng/mL (ref <0.30)
Troponin I: 1.91 ng/mL (ref <0.30)

## 2013-04-04 LAB — MAGNESIUM: MAGNESIUM: 1.7 mg/dL (ref 1.5–2.5)

## 2013-04-04 LAB — I-STAT TROPONIN, ED: TROPONIN I, POC: 0 ng/mL (ref 0.00–0.08)

## 2013-04-04 LAB — MRSA PCR SCREENING: MRSA by PCR: NEGATIVE

## 2013-04-04 MED ORDER — ASPIRIN 81 MG PO CHEW
324.0000 mg | CHEWABLE_TABLET | Freq: Once | ORAL | Status: AC
  Administered 2013-04-04: 324 mg via ORAL
  Filled 2013-04-04: qty 4

## 2013-04-04 MED ORDER — SIMVASTATIN 20 MG PO TABS
20.0000 mg | ORAL_TABLET | Freq: Every evening | ORAL | Status: DC
  Administered 2013-04-04 – 2013-04-05 (×2): 20 mg via ORAL
  Filled 2013-04-04 (×3): qty 1

## 2013-04-04 MED ORDER — ADULT MULTIVITAMIN W/MINERALS CH
1.0000 | ORAL_TABLET | Freq: Every day | ORAL | Status: DC
  Administered 2013-04-04 – 2013-04-07 (×4): 1 via ORAL
  Filled 2013-04-04 (×5): qty 1

## 2013-04-04 MED ORDER — SODIUM CHLORIDE 0.9 % IV SOLN: 250.0000 mL | INTRAVENOUS | Status: DC | PRN

## 2013-04-04 MED ORDER — ASPIRIN 325 MG PO TABS
325.0000 mg | ORAL_TABLET | Freq: Every day | ORAL | Status: DC
  Administered 2013-04-05 – 2013-04-07 (×3): 325 mg via ORAL
  Filled 2013-04-04 (×3): qty 1

## 2013-04-04 MED ORDER — DEXTROSE 5 % IV SOLN
500.0000 mg | Freq: Once | INTRAVENOUS | Status: AC
  Administered 2013-04-04: 500 mg via INTRAVENOUS

## 2013-04-04 MED ORDER — DEXTROSE 5 % IV SOLN
1.0000 g | Freq: Once | INTRAVENOUS | Status: AC
  Administered 2013-04-04: 1 g via INTRAVENOUS
  Filled 2013-04-04: qty 10

## 2013-04-04 MED ORDER — SODIUM CHLORIDE 0.9 % IJ SOLN
3.0000 mL | Freq: Two times a day (BID) | INTRAMUSCULAR | Status: DC
  Administered 2013-04-04 – 2013-04-07 (×7): 3 mL via INTRAVENOUS

## 2013-04-04 MED ORDER — HEPARIN BOLUS VIA INFUSION
4000.0000 [IU] | Freq: Once | INTRAVENOUS | Status: AC
  Administered 2013-04-04: 4000 [IU] via INTRAVENOUS
  Filled 2013-04-04: qty 4000

## 2013-04-04 MED ORDER — FUROSEMIDE 10 MG/ML IJ SOLN
INTRAMUSCULAR | Status: AC
  Filled 2013-04-04: qty 8

## 2013-04-04 MED ORDER — FUROSEMIDE 10 MG/ML IJ SOLN
60.0000 mg | Freq: Two times a day (BID) | INTRAMUSCULAR | Status: DC
  Administered 2013-04-04 – 2013-04-05 (×3): 60 mg via INTRAVENOUS
  Filled 2013-04-04 (×4): qty 6

## 2013-04-04 MED ORDER — SODIUM CHLORIDE 0.9 % IJ SOLN: 3.0000 mL | INTRAMUSCULAR | Status: DC | PRN

## 2013-04-04 MED ORDER — FUROSEMIDE 10 MG/ML IJ SOLN
80.0000 mg | Freq: Once | INTRAMUSCULAR | Status: AC
  Administered 2013-04-04: 80 mg via INTRAVENOUS
  Filled 2013-04-04: qty 8

## 2013-04-04 MED ORDER — OXYCODONE HCL 5 MG PO TABS: 5.0000 mg | ORAL_TABLET | ORAL | Status: DC | PRN

## 2013-04-04 MED ORDER — HEPARIN (PORCINE) IN NACL 100-0.45 UNIT/ML-% IJ SOLN
1750.0000 [IU]/h | INTRAMUSCULAR | Status: DC
  Administered 2013-04-04: 1250 [IU]/h via INTRAVENOUS
  Administered 2013-04-04: 1550 [IU]/h via INTRAVENOUS
  Filled 2013-04-04 (×5): qty 250

## 2013-04-04 MED ORDER — INSULIN ASPART 100 UNIT/ML ~~LOC~~ SOLN
0.0000 [IU] | Freq: Three times a day (TID) | SUBCUTANEOUS | Status: DC
  Administered 2013-04-04: 5 [IU] via SUBCUTANEOUS
  Administered 2013-04-04 – 2013-04-05 (×2): 3 [IU] via SUBCUTANEOUS
  Administered 2013-04-05 (×2): 5 [IU] via SUBCUTANEOUS
  Administered 2013-04-06: 3 [IU] via SUBCUTANEOUS
  Administered 2013-04-06: 2 [IU] via SUBCUTANEOUS
  Administered 2013-04-06: 5 [IU] via SUBCUTANEOUS
  Administered 2013-04-07: 3 [IU] via SUBCUTANEOUS
  Administered 2013-04-07: 5 [IU] via SUBCUTANEOUS

## 2013-04-04 MED ORDER — INSULIN ASPART 100 UNIT/ML ~~LOC~~ SOLN
0.0000 [IU] | Freq: Every day | SUBCUTANEOUS | Status: DC
  Administered 2013-04-04 – 2013-04-06 (×3): 2 [IU] via SUBCUTANEOUS

## 2013-04-04 MED ORDER — METOPROLOL TARTRATE 25 MG PO TABS
25.0000 mg | ORAL_TABLET | Freq: Two times a day (BID) | ORAL | Status: DC
  Administered 2013-04-04 – 2013-04-06 (×5): 25 mg via ORAL
  Filled 2013-04-04 (×6): qty 1

## 2013-04-04 MED ORDER — HEPARIN BOLUS VIA INFUSION
2500.0000 [IU] | Freq: Once | INTRAVENOUS | Status: AC
  Administered 2013-04-04: 2500 [IU] via INTRAVENOUS
  Filled 2013-04-04: qty 2500

## 2013-04-04 MED ORDER — QUINAPRIL HCL 10 MG PO TABS
20.0000 mg | ORAL_TABLET | Freq: Every morning | ORAL | Status: DC
  Administered 2013-04-04 – 2013-04-07 (×4): 20 mg via ORAL
  Filled 2013-04-04 (×5): qty 2

## 2013-04-04 MED ORDER — GI COCKTAIL ~~LOC~~
30.0000 mL | Freq: Once | ORAL | Status: AC
  Administered 2013-04-04: 30 mL via ORAL
  Filled 2013-04-04: qty 30

## 2013-04-04 MED ORDER — MORPHINE SULFATE 4 MG/ML IJ SOLN
4.0000 mg | Freq: Once | INTRAMUSCULAR | Status: AC
  Administered 2013-04-04: 4 mg via INTRAVENOUS
  Filled 2013-04-04: qty 1

## 2013-04-04 NOTE — ED Notes (Signed)
Per EMS: pt coming from home with c/o shortness of breath. Pt given nitro x 3, hypertensive, room sats mid 80's, pt placed on cpap did not tolerate it well, switch too 15 lpm non-rebreather. Pt is A&Ox4, respirations labored, and tachy. Pt states last dose of lasix was two days ago. Pt takes lasix as needed. Pt denies pain. Hx of MI x 2

## 2013-04-04 NOTE — ED Notes (Signed)
Cardiology Doctor at bedside.

## 2013-04-04 NOTE — Progress Notes (Signed)
Pt. Refuses CPAP at this time. Pt. Stated that he had a sleep study done & didn't tolerate CPAP. Pt. Was made aware to let RT know anytime during the night if he changed his mind & decided to wear CPAP.

## 2013-04-04 NOTE — Progress Notes (Signed)
ANTICOAGULATION CONSULT NOTE - Initial Consult  Pharmacy Consult:  Heparin Indication: chest pain/ACS  No Known Allergies  Patient Measurements: Height: 5' 7"  (170.2 cm) Weight: 245 lb (111.131 kg) IBW/kg (Calculated) : 66.1 Heparin Dosing Weight: 91 kg  Vital Signs: Temp: 97.8 F (36.6 C) (02/24 0615) Temp src: Oral (02/24 0615) BP: 142/73 mmHg (02/24 0745) Pulse Rate: 93 (02/24 0745)  Labs:  Recent Labs  04/04/13 0526 04/04/13 0700 04/04/13 0744  HGB 15.2  --   --   HCT 45.1  --   --   PLT 198  --   --   LABPROT  --  13.8  --   INR  --  1.08  --   CREATININE 0.84  --   --   TROPONINI  --   --  0.85*    Estimated Creatinine Clearance: 104.3 ml/min (by C-G formula based on Cr of 0.84).   Medical History: Past Medical History  Diagnosis Date  . MI (myocardial infarction)     "has not seen cardiologist in several years"  . Diabetes mellitus     sees Dr. Hardin Negus in [place]  . Hyperlipidemia   . Vertigo        Assessment: 67 YOM admitted with respiratory distress due to flash pulmonary edema.  Pharmacy consulted to manage IV heparin while ruling out ACS.  Baseline labs reviewed.  Xarelto was listed on patient's previous med history; however, patient is unable to verify whether he is still taking the med.    Goal of Therapy:  Heparin level 0.3-0.7 units/ml Monitor platelets by anticoagulation protocol: Yes    Plan:  - STAT aPTT and HL - If negative, will start IV heparin with 4000 units IV bolus, then gtt at 1250 units/hr - Check 6 hr HL - Daily HL / CBC    Anice Wilshire D. Mina Marble, [name], BCPS Pager:  [phone] 04/04/2013, 8:56 AM    ==========================  Addendum: - HL undetectable and aPTT WNL (likely not on Xarelto PTA)   Plan: - Start IV heparin as planned (see above).    Chrystine Frogge D. Mina Marble, [name], BCPS Pager:  [phone] 04/04/2013, 9:36 AM

## 2013-04-04 NOTE — ED Notes (Signed)
Called patient's Daughter at work and Patient spoke with family member. Will come to hospital.

## 2013-04-04 NOTE — ED Notes (Signed)
MD at bedside. 

## 2013-04-04 NOTE — ED Notes (Addendum)
Lab called with Critical Value of troponin of 0.85. Paged Admit Doctor.

## 2013-04-04 NOTE — ED Notes (Signed)
Cardiology at bedside.

## 2013-04-04 NOTE — Care Management Note (Addendum)
    Page 1 of 1   04/07/2013     11:35:53 AM   CARE MANAGEMENT NOTE 04/07/2013  Patient:  Devin Hanna, Devin Hanna   Account Number:  [id number]  Date Initiated:  04/04/2013  Documentation initiated by:  Elissa Hefty  Subjective/Objective Assessment:   adm w heart failure     Action/Plan:   lives alone, pcp dr Juanda Crumble phillips   Anticipated DC Date:     Anticipated DC Plan:  [place]  CM consult      Choice offered to / List presented to:             Status of service:   Medicare Important Message given?   (If response is "NO", the following Medicare IM given date fields will be blank) Date Medicare IM given:   Date Additional Medicare IM given:    Discharge Disposition:  HOME/SELF CARE  Per UR Regulation:  Reviewed for med. necessity/level of care/duration of stay  If discussed at [place] of Stay Meetings, dates discussed:    Comments:  2/27  1134 debbie Judiann Celia rn, bsn pt amb well w card rehab. 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 will follow up w md in office.  2/24 1036 debbie Madell Heino rn,bsn will moniter for dc needs as pt progresses. hx of ahc.
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
## 2013-04-04 NOTE — ED Notes (Addendum)
Notified Admit Doctor troponin 0.85 and stated to also notify Cardiology. Paged Cardiology.

## 2013-04-04 NOTE — Progress Notes (Signed)
ANTICOAGULATION CONSULT NOTE - Follow Up Consult  Pharmacy Consult for Heparin Indication: chest pain/ACS  No Known Allergies  Patient Measurements: Height: 5' 7"  (170.2 cm) Weight: 241 lb 10 oz (109.6 kg) IBW/kg (Calculated) : 66.1 Heparin Dosing Weight: 91kg  Vital Signs: Temp: 99.2 F (37.3 C) (02/24 1618) Temp src: Oral (02/24 1618) BP: 134/71 mmHg (02/24 1618) Pulse Rate: 92 (02/24 1618)  Labs:  Recent Labs  04/04/13 0526 04/04/13 0700 04/04/13 0744 04/04/13 1020 04/04/13 1550 04/04/13 1715  HGB 15.2  --   --   --   --   --   HCT 45.1  --   --   --   --   --   PLT 198  --   --   --   --   --   APTT  --  32  --   --   --   --   LABPROT  --  13.8  --   --   --   --   INR  --  1.08  --   --   --   --   HEPARINUNFRC  --  <0.10*  --   --   --  0.15*  CREATININE 0.84  --   --   --   --   --   TROPONINI  --   --  0.85* 1.18* 2.25*  --     Estimated Creatinine Clearance: 103.5 ml/min (by C-G formula based on Cr of 0.84).   Medications:  Heparin 1250 units/hr  Assessment: 63yom on heparin for CP/ACS. Heparin level (0.15) is subtherapeutic - will re-bolus, increase heparin rate and check follow-up heparin level. - H/H and Plts wnl - No significant bleeding reported - No problems with line/infusion per RN  Goal of Therapy:  Heparin level 0.3-0.7 units/ml Monitor platelets by anticoagulation protocol: Yes   Plan:  1. Heparin IV bolus 2500 units x 1 2. Increase heparin drip to 1550 units/hr (15.5 ml/hr) 3. Check heparin level 6 hours after rate increase  Earleen Newport [phone] 04/04/2013,5:55 PM

## 2013-04-04 NOTE — ED Notes (Addendum)
Spoke with Pharmacist ordered lab test and when resulted will order heparin. Called lab stated able to add APTT Heparin level lab to available blood.

## 2013-04-04 NOTE — ED Provider Notes (Signed)
CSN: [id number]     Arrival date & time 04/04/13  1740 History   First MD Initiated Contact with Patient 04/04/13 0518     Chief Complaint  Patient presents with  . Shortness of Breath     (Consider location/radiation/quality/duration/timing/severity/associated sxs/prior Treatment) HPI Patient is a 63 yo man with a history of CAD, s/p MI and DM2. He presents from home via EMS after awakening shortly PTA with SOB. Paramedics report that the patient's room air oxygen sats were in the mid 80s at the scene and that he was in obvious respiratory distress. The team appreciated bibasilar rales and attempted to treat the patient with BiPAP but, he said that made him feel even more SOB.   Patient denies history of similar sx. Felt well before going to sleep at around 2300. Denies chest pain, cough, fever, abdominal pain. Denies any known history of cardiomyopathy.  Past Medical History  Diagnosis Date  . MI (myocardial infarction)     "has not seen cardiologist in several years"  . Diabetes mellitus     sees Dr. Hardin Negus in [place]  . Hyperlipidemia   . Vertigo    Past Surgical History  Procedure Laterality Date  . Coronary stent placement    . External fixation leg Left 04/03/2012    Procedure: EXTERNAL FIXATION LEG;  Surgeon: Wylene Simmer, MD;  Location: [facility];  Service: Orthopedics;  Laterality: Left;  . Irrigation and debridement knee Left 04/03/2012    Procedure: IRRIGATION AND DEBRIDEMENT KNEE;  Surgeon: Wylene Simmer, MD;  Location: [facility];  Service: Orthopedics;  Laterality: Left;  . Orif tibia plateau Left 04/14/2012    Procedure: OPEN REDUCTION INTERNAL FIXATION (ORIF) TIBIAL PLATEAU;  Surgeon: Wylene Simmer, MD;  Location: [facility];  Service: Orthopedics;  Laterality: Left;  . External fixation removal Left 04/14/2012    Procedure: REMOVAL EXTERNAL FIXATION LEG;  Surgeon: Wylene Simmer, MD;  Location: [facility];  Service: Orthopedics;  Laterality: Left;   History reviewed. No pertinent family  history. History  Substance Use Topics  . Smoking status: Never Smoker   . Smokeless tobacco: Not on file  . Alcohol Use: Yes     Comment: Occassional Use    Review of Systems Unable to obtain ROS from the patient due to respiratory distress.     Allergies  Review of patient's allergies indicates no known allergies.  Home Medications   Current Outpatient Rx  Name  Route  Sig  Dispense  Refill  . furosemide (LASIX) 20 MG tablet   Oral   Take 20 mg by mouth daily as needed. Takes if he has increased edema         . glipiZIDE (GLUCOTROL XL) 10 MG 24 hr tablet   Oral   Take 10 mg by mouth 2 (two) times daily.         . metFORMIN (GLUCOPHAGE) 500 MG tablet   Oral   Take 1,000 mg by mouth 2 (two) times daily.         . metoprolol tartrate (LOPRESSOR) 25 MG tablet   Oral   Take 25 mg by mouth 2 (two) times daily.         . Multiple Vitamin (MULTIVITAMIN WITH MINERALS) TABS   Oral   Take 1 tablet by mouth daily.         Marland Kitchen oxyCODONE (OXY IR/ROXICODONE) 5 MG immediate release tablet   Oral   Take 1-2 tablets (5-10 mg total) by mouth every 4 (four) hours as  needed for pain.   50 tablet   0   . quinapril (ACCUPRIL) 20 MG tablet   Oral   Take 20 mg by mouth every morning.         . rivaroxaban (XARELTO) 10 MG TABS tablet   Oral   Take 1 tablet (10 mg total) by mouth daily.   13 tablet   0   . simvastatin (ZOCOR) 20 MG tablet   Oral   Take 20 mg by mouth every evening.          BP 104/55  Pulse 92  Temp(Src) 97.8 F (36.6 C) (Oral)  Resp 23  Ht 5' 7"  (1.702 m)  Wt 245 lb (111.131 kg)  BMI 38.36 kg/m2  SpO2 95% Physical Exam Gen: well developed and well nourished appearing, appears to be in acute respiratory distress - speaking 1-2 words in between labored breaths.  Head: NCAT Eyes: PERL, EOMI Nose: no epistaixis or rhinorrhea Mouth/throat: mucosa is moist and pink Neck: supple, no stridor, no jvd appreciated Lungs: RR 40/min with accessory  muscle use and retractions, bibasilar rales CV: RRR, no murmur, extremities appear well perfused.  Abd: soft, notender, nondistended Back: no ttp, no cva ttp Skin: warm and dry Ext: normal to inspection, bilateral pretibial edema - left > right  Neuro: CN ii-xii grossly intact, no focal deficits Psyche; anxious affect  ED Course  Procedures (including critical care time) Labs Review  Results for orders placed during the hospital encounter of 04/04/13 (from the past 24 hour(s))  PRO B NATRIURETIC PEPTIDE     Status: Abnormal   Collection Time    04/04/13  5:26 AM      Result Value Ref Range   Pro B Natriuretic peptide (BNP) 272.5 (*) 0 - 125 pg/mL  CBC WITH DIFFERENTIAL     Status: None   Collection Time    04/04/13  5:26 AM      Result Value Ref Range   WBC 9.7  4.0 - 10.5 K/uL   RBC 4.77  4.22 - 5.81 MIL/uL   Hemoglobin 15.2  13.0 - 17.0 g/dL   HCT 45.1  39.0 - 52.0 %   MCV 94.5  78.0 - 100.0 fL   MCH 31.9  26.0 - 34.0 pg   MCHC 33.7  30.0 - 36.0 g/dL   RDW 13.8  11.5 - 15.5 %   Platelets 198  150 - 400 K/uL   Neutrophils Relative % 61  43 - 77 %   Neutro Abs 5.9  1.7 - 7.7 K/uL   Lymphocytes Relative 30  12 - 46 %   Lymphs Abs 2.9  0.7 - 4.0 K/uL   Monocytes Relative 7  3 - 12 %   Monocytes Absolute 0.7  0.1 - 1.0 K/uL   Eosinophils Relative 2  0 - 5 %   Eosinophils Absolute 0.2  0.0 - 0.7 K/uL   Basophils Relative 0  0 - 1 %   Basophils Absolute 0.0  0.0 - 0.1 K/uL  COMPREHENSIVE METABOLIC PANEL     Status: Abnormal   Collection Time    04/04/13  5:26 AM      Result Value Ref Range   Sodium 139  137 - 147 mEq/L   Potassium 4.0  3.7 - 5.3 mEq/L   Chloride 97  96 - 112 mEq/L   CO2 23  19 - 32 mEq/L   Glucose, Bld 313 (*) 70 - 99 mg/dL   BUN 10  6 -  23 mg/dL   Creatinine, Ser 0.84  0.50 - 1.35 mg/dL   Calcium 9.2  8.4 - 10.5 mg/dL   Total Protein 7.3  6.0 - 8.3 g/dL   Albumin 3.3 (*) 3.5 - 5.2 g/dL   AST 49 (*) 0 - 37 U/L   ALT 43  0 - 53 U/L   Alkaline  Phosphatase 129 (*) 39 - 117 U/L   Total Bilirubin 0.6  0.3 - 1.2 mg/dL   GFR calc non Af Amer 90 (*) >90 mL/min   GFR calc Af Amer >90  >90 mL/min  I-STAT TROPOININ, ED     Status: None   Collection Time    04/04/13  5:33 AM      Result Value Ref Range   Troponin i, poc 0.00  0.00 - 0.08 ng/mL   Comment 3              Imaging Review Dg Chest Portable 1 View  04/04/2013   CLINICAL DATA:  Shortness of breath and chest pain.  EXAM: PORTABLE CHEST - 1 VIEW  COMPARISON:  Chest radiograph performed 04/14/2012  FINDINGS: Bibasilar airspace opacification may reflect multifocal pneumonia or pulmonary edema. Underlying vascular congestion is seen. No definite pleural effusion or pneumothorax seen.  The cardiomediastinal silhouette is borderline normal in size. No acute osseous abnormalities are identified.  IMPRESSION: Bibasilar airspace opacification may reflect multifocal pneumonia or pulmonary edema. Underlying vascular congestion seen.   Electronically Signed   By: Garald Balding M.D.   On: 04/04/2013 05:48    EKG: NSR with questionable new LBBB, widened QRS, PACs present, normal axis.   MDM    Patient with acute respiratory distress. Hx, CXR and PE consistent with flash pulmonary edema. We treated with BiPAP with significant improvement in sx. Patient given morphine for air hunger to facilitate tx with BiPAP. Tx with lasix 61m IV but has yet to diurese. But, says he is feeling quite a bit better. Still denies chest pain. First trop negative. Interestingly, the patient's BNP is only 200. Out of concern regarding possible new LBBB, a code STEMI was activated. Dr. MDionicia Ablerresponded to the ED, reviewed the patient's EKG and says he does not feel that we need to proceed with code STEMI. He expressed agreement with current management plan. BP remains acceptable.    Case discussed with Dr. KHal Hopewho has accepted the patient for admission to the SDU.   CRITICAL CARE Performed by: MElyn Peers  Total critical care time: 427mCritical care time was exclusive of separately billable procedures and treating other patients.  Critical care was necessary to treat or prevent imminent or life-threatening deterioration.  Critical care was time spent personally by me on the following activities: development of treatment plan with patient and/or surrogate as well as nursing, discussions with consultants, evaluation of patient's response to treatment, examination of patient, obtaining history from patient or surrogate, ordering and performing treatments and interventions, ordering and review of laboratory studies, ordering and review of radiographic studies, pulse oximetry and re-evaluation of patient's condition.     JuElyn PeersMD 04/04/13 [phone]
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
## 2013-04-04 NOTE — Progress Notes (Signed)
  Echocardiogram 2D Echocardiogram has been performed.  Devin Hanna 04/04/2013, 3:16 PM

## 2013-04-05 ENCOUNTER — Encounter (HOSPITAL_COMMUNITY)
Admission: EM | Disposition: A | Discharge status: Home / Self Care | Payer: Medicare Other | Source: Home / Self Care | Attending: Internal Medicine

## 2013-04-05 ENCOUNTER — Encounter (HOSPITAL_COMMUNITY): Payer: Self-pay | Admitting: Emergency Medicine

## 2013-04-05 DIAGNOSIS — I214 Non-ST elevation (NSTEMI) myocardial infarction: Secondary | ICD-10-CM | POA: Diagnosis not present

## 2013-04-05 DIAGNOSIS — J9600 Acute respiratory failure, unspecified whether with hypoxia or hypercapnia: Secondary | ICD-10-CM | POA: Diagnosis not present

## 2013-04-05 DIAGNOSIS — I2510 Atherosclerotic heart disease of native coronary artery without angina pectoris: Secondary | ICD-10-CM

## 2013-04-05 DIAGNOSIS — E785 Hyperlipidemia, unspecified: Secondary | ICD-10-CM

## 2013-04-05 DIAGNOSIS — E119 Type 2 diabetes mellitus without complications: Secondary | ICD-10-CM

## 2013-04-05 HISTORY — PX: LEFT HEART CATHETERIZATION WITH CORONARY ANGIOGRAM: SHX5451

## 2013-04-05 LAB — BASIC METABOLIC PANEL
BUN: 16 mg/dL (ref 6–23)
CO2: 27 mEq/L (ref 19–32)
Calcium: 8.7 mg/dL (ref 8.4–10.5)
Chloride: 97 mEq/L (ref 96–112)
Creatinine, Ser: 0.97 mg/dL (ref 0.50–1.35)
GFR calc non Af Amer: 85 mL/min — ABNORMAL LOW (ref >90)
Glucose, Bld: 197 mg/dL — ABNORMAL HIGH (ref 70–99)
POTASSIUM: 4 meq/L (ref 3.7–5.3)
Sodium: 141 mEq/L (ref 137–147)

## 2013-04-05 LAB — HEPARIN LEVEL (UNFRACTIONATED)
HEPARIN UNFRACTIONATED: 0.3 [IU]/mL (ref 0.30–0.70)
Heparin Unfractionated: 0.28 IU/mL — ABNORMAL LOW (ref 0.30–0.70)
Heparin Unfractionated: 0.25 IU/mL — ABNORMAL LOW (ref 0.30–0.70)

## 2013-04-05 LAB — CBC
HCT: 42 % (ref 39.0–52.0)
HEMOGLOBIN: 14.8 g/dL (ref 13.0–17.0)
MCH: 33.6 pg (ref 26.0–34.0)
MCHC: 35.2 g/dL (ref 30.0–36.0)
MCV: 95.2 fL (ref 78.0–100.0)
Platelets: 179 10*3/uL (ref 150–400)
RBC: 4.41 MIL/uL (ref 4.22–5.81)
RDW: 14.1 % (ref 11.5–15.5)
WBC: 9.8 10*3/uL (ref 4.0–10.5)

## 2013-04-05 LAB — GLUCOSE, CAPILLARY
GLUCOSE-CAPILLARY: 230 mg/dL — AB (ref 70–99)
GLUCOSE-CAPILLARY: 237 mg/dL — AB (ref 70–99)
Glucose-Capillary: 201 mg/dL — ABNORMAL HIGH (ref 70–99)
Glucose-Capillary: 182 mg/dL — ABNORMAL HIGH (ref 70–99)

## 2013-04-05 LAB — POCT ACTIVATED CLOTTING TIME: ACTIVATED CLOTTING TIME: 431 s

## 2013-04-05 SURGERY — LEFT HEART CATHETERIZATION WITH CORONARY ANGIOGRAM
Anesthesia: LOCAL

## 2013-04-05 MED ORDER — HEPARIN (PORCINE) IN NACL 2-0.9 UNIT/ML-% IJ SOLN
INTRAMUSCULAR | Status: AC
  Filled 2013-04-05: qty 1500

## 2013-04-05 MED ORDER — LIDOCAINE HCL (PF) 1 % IJ SOLN
INTRAMUSCULAR | Status: AC
  Filled 2013-04-05: qty 30

## 2013-04-05 MED ORDER — FENTANYL CITRATE 0.05 MG/ML IJ SOLN
INTRAMUSCULAR | Status: AC
  Filled 2013-04-05: qty 2

## 2013-04-05 MED ORDER — SODIUM CHLORIDE 0.9 % IV SOLN
INTRAVENOUS | Status: AC
Start: 2013-04-05 — End: 2013-04-05

## 2013-04-05 MED ORDER — NITROGLYCERIN 0.2 MG/ML ON CALL CATH LAB
INTRAVENOUS | Status: AC
  Filled 2013-04-05: qty 1

## 2013-04-05 MED ORDER — SODIUM CHLORIDE 0.9 % IJ SOLN: 3.0000 mL | Freq: Two times a day (BID) | INTRAMUSCULAR | Status: DC

## 2013-04-05 MED ORDER — CLOPIDOGREL BISULFATE 75 MG PO TABS
75.0000 mg | ORAL_TABLET | Freq: Every day | ORAL | Status: DC
  Administered 2013-04-06 – 2013-04-07 (×2): 75 mg via ORAL
  Filled 2013-04-05 (×2): qty 1

## 2013-04-05 MED ORDER — HEPARIN (PORCINE) IN NACL 100-0.45 UNIT/ML-% IJ SOLN
1950.0000 [IU]/h | INTRAMUSCULAR | Status: DC
  Administered 2013-04-05: 1750 [IU]/h via INTRAVENOUS
  Administered 2013-04-06: 1950 [IU]/h via INTRAVENOUS
  Filled 2013-04-05 (×3): qty 250

## 2013-04-05 MED ORDER — SODIUM CHLORIDE 0.9 % IV SOLN: INTRAVENOUS | Status: DC

## 2013-04-05 MED ORDER — FUROSEMIDE 10 MG/ML IJ SOLN
INTRAMUSCULAR | Status: AC
  Filled 2013-04-05: qty 8

## 2013-04-05 MED ORDER — SODIUM CHLORIDE 0.9 % IV SOLN: 250.0000 mL | INTRAVENOUS | Status: DC | PRN

## 2013-04-05 MED ORDER — MIDAZOLAM HCL 2 MG/2ML IJ SOLN
INTRAMUSCULAR | Status: AC
  Filled 2013-04-05: qty 2

## 2013-04-05 MED ORDER — BIVALIRUDIN 250 MG IV SOLR
INTRAVENOUS | Status: AC
  Filled 2013-04-05: qty 250

## 2013-04-05 MED ORDER — HEPARIN SODIUM (PORCINE) 1000 UNIT/ML IJ SOLN
INTRAMUSCULAR | Status: AC
  Filled 2013-04-05: qty 1

## 2013-04-05 MED ORDER — SODIUM CHLORIDE 0.9 % IJ SOLN: 3.0000 mL | INTRAMUSCULAR | Status: DC | PRN

## 2013-04-05 MED ORDER — VERAPAMIL HCL 2.5 MG/ML IV SOLN
INTRAVENOUS | Status: AC
  Filled 2013-04-05: qty 2

## 2013-04-05 MED ORDER — INSULIN DETEMIR 100 UNIT/ML ~~LOC~~ SOLN
16.0000 [IU] | Freq: Every day | SUBCUTANEOUS | Status: DC
  Administered 2013-04-05: 16 [IU] via SUBCUTANEOUS
  Filled 2013-04-05 (×2): qty 0.16

## 2013-04-05 MED ORDER — SODIUM CHLORIDE 0.9 % IV SOLN
1.7500 mg/kg/h | INTRAVENOUS | Status: DC
  Filled 2013-04-05: qty 250

## 2013-04-05 MED ORDER — HEPARIN (PORCINE) IN NACL 2-0.9 UNIT/ML-% IJ SOLN
INTRAMUSCULAR | Status: AC
  Filled 2013-04-05: qty 1000

## 2013-04-05 MED ORDER — CLOPIDOGREL BISULFATE 300 MG PO TABS
ORAL_TABLET | ORAL | Status: AC
  Filled 2013-04-05: qty 2

## 2013-04-05 MED ORDER — PANTOPRAZOLE SODIUM 40 MG PO TBEC
40.0000 mg | DELAYED_RELEASE_TABLET | Freq: Every day | ORAL | Status: DC
  Administered 2013-04-05 – 2013-04-07 (×3): 40 mg via ORAL
  Filled 2013-04-05 (×3): qty 1

## 2013-04-05 NOTE — Progress Notes (Signed)
[facility] TEAM 1 - Stepdown/ICU TEAM Progress Note  Devin Hanna JHE:[id number] DOB: August 06, 1950 DOA: 04/04/2013 PCP: Ronald Lobo, MD  Admit HPI / Brief Narrative: 63 y.o. male w/ history of CAD status post stents, history of MI, DM, and hypertension who presented w/ c/o sudden shortness of breath that awoke him from sleep. Patient denied any fevers or coughing.   While in the ED the patient had portable chest x-ray which reported bibasilar airspace opacification that may reflect multifocal pneumonia or pulmonary edema. Patient's condition improved after Lasix and BiPAP.   HPI/Subjective: The patient is resting comfortably in bed at the present time.  He currently denies chest pain.  He states the shortness of breath has greatly improved.  He denies nausea vomiting or abdominal pain.  Assessment/Plan:  STEMI (new LBBB) w/ History of CAD status post stents  Cardiac cath today revealed significant 2 vessel coronary artery disease with 99% instent restenosis in the right coronary artery (heavily fibrotic and calcified) with a 60-70% distal LAD stenosis - care as per cardiology with tentative plans to reattempt catheter treatment  Acute systolic congestive heart failure LVEF 30-35% with severe hypokinesis of the inferolateral, inferior, and inferoseptal myocardium  Acute respiratory failure with hypoxia Due to above - improving with diuresis  DM CBG poorly controlled - check A1c - adjust treatment and follow  HLD Continue medical management  Code Status: FULL Family Communication: no family present at time of exam Disposition Plan: SDU  Consultants: Cardiology  Procedures: TTE - results as noted above Cardiac cath 2/25 - results as noted above  Antibiotics: None  DVT prophylaxis: IV heparin  Objective: Blood pressure 111/55, pulse 76, temperature 99.2 F (37.3 C), temperature source Oral, resp. rate 16, height 5' 7"  (1.702 m), weight 109 kg (240 lb 4.8 oz), SpO2  96.00%.  Intake/Output Summary (Last 24 hours) at 04/05/13 1517 Last data filed at 04/05/13 1100  Gross per 24 hour  Intake  154.5 ml  Output    550 ml  Net -395.5 ml   Exam: General: No acute respiratory distress at rest in bed Lungs: Mild bibasilar crackles with good air movement throughout other fields with no wheeze Cardiovascular: Regular rate and rhythm without murmur gallop or rub normal S1 and S2 Abdomen: Nontender, nondistended, soft, bowel sounds positive, no rebound, no ascites, no appreciable mass Extremities: No significant cyanosis, or clubbing;  1+ edema bilateral lower extremities  Data Reviewed: Basic Metabolic Panel:  Recent Labs Lab 04/04/13 0526 04/04/13 1020 04/05/13 0103  NA 139  --  141  K 4.0  --  4.0  CL 97  --  97  CO2 23  --  27  GLUCOSE 313*  --  197*  BUN 10  --  16  CREATININE 0.84  --  0.97  CALCIUM 9.2  --  8.7  MG  --  1.7  --    Liver Function Tests:  Recent Labs Lab 04/04/13 0526  AST 49*  ALT 43  ALKPHOS 129*  BILITOT 0.6  PROT 7.3  ALBUMIN 3.3*   CBC:  Recent Labs Lab 04/04/13 0526 04/05/13 0103  WBC 9.7 9.8  NEUTROABS 5.9  --   HGB 15.2 14.8  HCT 45.1 42.0  MCV 94.5 95.2  PLT 198 179   Cardiac Enzymes:  Recent Labs Lab 04/04/13 0744 04/04/13 1020 04/04/13 1550 04/04/13 2210  TROPONINI 0.85* 1.18* 2.25* 1.91*   BNP (last 3 results)  Recent Labs  04/04/13 0526  PROBNP 272.5*  CBG:  Recent Labs Lab 04/04/13 1221 04/04/13 1621 04/04/13 2137 04/05/13 0750 04/05/13 1148  GLUCAP 205* 193* 233* 201* 182*    Recent Results (from the past 240 hour(s))  MRSA PCR SCREENING     Status: None   Collection Time    04/04/13 10:06 AM      Result Value Ref Range Status   MRSA by PCR NEGATIVE  NEGATIVE Final   Comment:            The GeneXpert MRSA Assay (FDA     approved for NASAL specimens     only), is one component of a     comprehensive MRSA colonization     surveillance program. It is not      intended to diagnose MRSA     infection nor to guide or     monitor treatment for     MRSA infections.     Studies:  Recent x-ray studies have been reviewed in detail by the Attending Physician  Scheduled Meds:  Scheduled Meds: . aspirin  325 mg Oral Daily  . [START ON 04/06/2013] clopidogrel  75 mg Oral Q breakfast  . furosemide  60 mg Intravenous BID  . insulin aspart  0-15 Units Subcutaneous TID WC  . insulin aspart  0-5 Units Subcutaneous QHS  . metoprolol tartrate  25 mg Oral BID  . multivitamin with minerals  1 tablet Oral Daily  . pantoprazole  40 mg Oral Daily  . quinapril  20 mg Oral q morning - 10a  . simvastatin  20 mg Oral QPM  . sodium chloride  3 mL Intravenous Q12H    Time spent on care of this patient: 35 mins   [place]  [phone] Pager - Text Page per Shea Evans as per below:  On-Call/Text Page:      [email or web address]      password [email or web address]  If 7PM-7AM, please contact night-coverage [email or web address] Password [email or web address] 04/05/2013, 3:17 PM   LOS: 1 day

## 2013-04-05 NOTE — Progress Notes (Signed)
Patient Name: Devin Hanna Date of Encounter: 04/05/2013  Length of Stay: 1  SUBJECTIVE  No chest pain, SOB has improved.  CURRENT MEDS . aspirin  325 mg Oral Daily  . furosemide  60 mg Intravenous BID  . insulin aspart  0-15 Units Subcutaneous TID WC  . insulin aspart  0-5 Units Subcutaneous QHS  . metoprolol tartrate  25 mg Oral BID  . multivitamin with minerals  1 tablet Oral Daily  . pantoprazole  40 mg Oral Daily  . quinapril  20 mg Oral q morning - 10a  . simvastatin  20 mg Oral QPM  . sodium chloride  3 mL Intravenous Q12H   OBJECTIVE  Filed Vitals:   04/04/13 2035 04/04/13 2345 04/05/13 0507 04/05/13 0748  BP:    128/61  Pulse:    77  Temp: 100.2 F (37.9 C) 98.8 F (37.1 C) 97.5 F (36.4 C) 99 F (37.2 C)  TempSrc: Oral Oral Oral Oral  Resp:    16  Height:      Weight:   240 lb 4.8 oz (109 kg)   SpO2:    100%    Intake/Output Summary (Last 24 hours) at 04/05/13 0837 Last data filed at 04/05/13 0800  Gross per 24 hour  Intake  143.5 ml  Output    950 ml  Net -806.5 ml   Filed Weights   04/04/13 0627 04/04/13 0930 04/05/13 0507  Weight: 245 lb (111.131 kg) 241 lb 10 oz (109.6 kg) 240 lb 4.8 oz (109 kg)   PHYSICAL EXAM  General: Pleasant, NAD. Neuro: Alert and oriented X 3. Moves all extremities spontaneously. Psych: Normal affect. HEENT:  Normal  Neck: Supple without bruits or JVD. Lungs:  Resp regular and unlabored, mild crackes B/L. Heart: RRR no s3, s4, or murmurs. Abdomen: Soft, non-tender, non-distended, BS + x 4.  Extremities: No clubbing, cyanosis, trace B/L edema. DP/PT/Radials 2+ and equal bilaterally.  Accessory Clinical Findings  CBC  Recent Labs  04/04/13 0526 04/05/13 0103  WBC 9.7 9.8  NEUTROABS 5.9  --   HGB 15.2 14.8  HCT 45.1 42.0  MCV 94.5 95.2  PLT 198 161   Basic Metabolic Panel  Recent Labs  04/04/13 0526 04/04/13 1020 04/05/13 0103  NA 139  --  141  K 4.0  --  4.0  CL 97  --  97  CO2 23  --  27    GLUCOSE 313*  --  197*  BUN 10  --  16  CREATININE 0.84  --  0.97  CALCIUM 9.2  --  8.7  MG  --  1.7  --    Liver Function Tests  Recent Labs  04/04/13 0526  AST 49*  ALT 43  ALKPHOS 129*  BILITOT 0.6  PROT 7.3  ALBUMIN 3.3*   Cardiac Enzymes  Recent Labs  04/04/13 1020 04/04/13 1550 04/04/13 2210  TROPONINI 1.18* 2.25* 1.91*    Recent Labs  04/04/13 1020  TSH 3.056   Radiology/Studies  Dg Chest Portable 1 View  04/04/2013   CLINICAL DATA:  Shortness of breath and chest pain.  EXAM: PORTABLE CHEST - 1 VIEW  COMPARISON:  Chest radiograph performed 04/14/2012  FINDINGS: Bibasilar airspace opacification may reflect multifocal pneumonia or pulmonary edema. Underlying vascular congestion is seen. No definite pleural effusion or pneumothorax seen.  The cardiomediastinal silhouette is borderline normal in size. No acute osseous abnormalities are identified.  IMPRESSION: Bibasilar airspace opacification may reflect multifocal pneumonia or  pulmonary edema. Underlying vascular congestion seen.   Electronically Signed   By: Garald Balding M.D.   On: 04/04/2013 05:48   TELE: SR, frequent PVCs  ECG: SR, new LBBB  Echo 04/04/2013 Study Conclusions  - Left ventricle: The cavity size was normal. Systolic function was moderately to severely reduced. The estimated ejection fraction was in the range of 30% to 35%. Severe hypokinesis of the inferolateral, inferior, and inferoseptal myocardium. - Ventricular septum: Septal motion showed abnormal function, dyssynergy, and paradox. These changes are consistent with a left bundle branch block. - Left atrium: The atrium was moderately dilated.     ASSESSMENT AND PLAN  Active Problems:   MI (myocardial infarction)   Acute respiratory failure   Acute respiratory failure with hypoxia   CAD (coronary artery disease)   Pulmonary edema  63 year old male with known CAD, h/o cardiac arrest in 1997, with angioplasty to ? Vessel,  multiple PCIs to unknown vessels years later, he hasn't follow in the last 10 years. He presented with SOB and flash pulmonary edema on 04/03/13.  1. NSTEMI - new LBBB yesterday, so technically STEMI, the patient ate this am at 8 am, we will send to cath ASAP- 1 pm, continue ASA 81 mg po daily, metoprolol, change simvastatin to atorvastatin 80 mg po daily, continue heparin drip. He has no chest pain, SOB has improved.  2. Acute systolic CHF - LVEF 54-56% with severe hypokinesis of the inferolateral, inferior, and inferoseptal myocardium, diuresed - 800 in the last 24 hours   3. DM - on insulin SS  4. Hyperlipidemia - on high dose atorvastatin  Signed, Ena Dawley, H MD, [facility] 04/05/2013

## 2013-04-05 NOTE — Progress Notes (Signed)
ANTICOAGULATION CONSULT NOTE - Pharmacy Consult for Heparin Indication: chest pain/ACS  No Known Allergies  Patient Measurements: Height: 5' 7"  (170.2 cm) Weight: 241 lb 10 oz (109.6 kg) IBW/kg (Calculated) : 66.1 Heparin Dosing Weight: 91kg  Vital Signs: Temp: 98.8 F (37.1 C) (02/24 2345) Temp src: Oral (02/24 2345) BP: 139/71 mmHg (02/24 1900) Pulse Rate: 88 (02/24 1900)  Labs:  Recent Labs  04/04/13 0526 04/04/13 0700  04/04/13 1020 04/04/13 1550 04/04/13 1715 04/04/13 2210 04/05/13 0103  HGB 15.2  --   --   --   --   --   --   --   HCT 45.1  --   --   --   --   --   --   --   PLT 198  --   --   --   --   --   --   --   APTT  --  32  --   --   --   --   --   --   LABPROT  --  13.8  --   --   --   --   --   --   INR  --  1.08  --   --   --   --   --   --   HEPARINUNFRC  --  <0.10*  --   --   --  0.15*  --  0.30  CREATININE 0.84  --   --   --   --   --   --   --   TROPONINI  --   --   < > 1.18* 2.25*  --  1.91*  --   < > = values in this interval not displayed.  Estimated Creatinine Clearance: 103.5 ml/min (by C-G formula based on Cr of 0.84).  Assessment: 63 yo male with CHF/ACS for heparin  Goal of Therapy:  Heparin level 0.3-0.7 units/ml Monitor platelets by anticoagulation protocol: Yes   Plan:  Continue Heparin at current rate for now F/U AM labs   Emmaus Brandi, Bronson Curb 04/05/2013,2:25 AM

## 2013-04-05 NOTE — Interval H&P Note (Signed)
Cath Lab Visit (complete for each Cath Lab visit)  Clinical Evaluation Leading to the Procedure:   ACS: yes  Non-ACS:    Anginal Classification: CCS IV  Anti-ischemic medical therapy: Minimal Therapy (1 class of medications)  Non-Invasive Test Results: No non-invasive testing performed  Prior CABG: No previous CABG      History and Physical Interval Note:  04/05/2013 12:27 PM  Devin Hanna  has presented today for surgery, with the diagnosis of cp  The various methods of treatment have been discussed with the patient and family. After consideration of risks, benefits and other options for treatment, the patient has consented to  Procedure(s): LEFT HEART CATHETERIZATION WITH CORONARY ANGIOGRAM (N/A) as a surgical intervention .  The patient's history has been reviewed, patient examined, no change in status, stable for surgery.  I have reviewed the patient's chart and labs.  Questions were answered to the patient's satisfaction.     [name]

## 2013-04-05 NOTE — CV Procedure (Signed)
Cardiac Catheterization Procedure Note  Name: Devin Hanna MRN: [id number] DOB: February 29, 1950  Procedure: Left Heart Cath, Selective Coronary Angiography, unsuccessful RCA angioplasty due to inability to dilate the heavily calcified lesion.  Indication: Non-ST elevation myocardial infarction with heart failure. Previous stenting of the right coronary artery in the 90s with no recent cardiac followup.  Medications:  Sedation:  1 mg IV Versed, 25 mcg IV Fentanyl  Contrast:  150 mL Omnipaque  Procedural Details: The right wrist was prepped, draped, and anesthetized with 1% lidocaine. Using the modified Seldinger technique, a 5 French Slender sheath was introduced into the right radial artery. 3 mg of verapamil was administered through the sheath, weight-based unfractionated heparin was administered intravenously. A Jackie catheter was used for selective coronary angiography. A pigtail catheter was used for left ventriculography. Catheter exchanges were performed over an exchange length guidewire. There were no immediate procedural complications.  Procedural Findings:  Hemodynamics: AO:  98/55   mmHg LV:  100/14    mmHg LVEDP: 21  mmHg  Coronary angiography: Coronary dominance: Right   Left Main:  Normal  Left Anterior Descending (LAD):  Normal in size with moderate to heavy calcifications. There is diffuse 40% disease in the midsegment. There is a discrete 60-70% stenosis distally with another 50% stenosis close to the apex.  1st diagonal (D1):  Large in size with minor irregularities.   2nd diagonal (D2):  Large in size with no significant disease.  3rd diagonal (D3):  Normal in size with minor irregularities.  Circumflex (LCx):  Normal in size and nondominant. The vessel is moderately calcified. There is diffuse 20% disease proximally.  1st obtuse marginal:  Small in size with minor irregularities.  2nd obtuse marginal:  Normal in size with 60% ostial stenosis.  3rd obtuse  marginal:  Small in size with minor irregularities.   Right Coronary Artery: Large in size and dominant. The vessel is heavily calcified. The stent is noted in the proximal and midsegment with severe 99% instent restenosis.  Posterior descending artery: Large in size with minor irregularities.  Posterior AV segment: Normal in size with minor irregularities.  Posterolateral branchs:  PL 1 is normal in size with minor irregularities. PL 2 is large in size with minor irregularities.  Left ventriculography: Was not performed as the patient had an echocardiogram done.  PCI Note:  Following the diagnostic procedure, the decision was made to proceed with PCI.  Weight-based bivalirudin was given for anticoagulation. Once a therapeutic ACT was achieved, a 6 [place] AL-1 guide catheter was inserted. A JR 4 and AR-1 guiding catheter did not provide optimal support. Also the AL-1 was difficult to engage via the radial approach.  A run through coronary guidewire was used to cross the lesion.  The lesion was predilated with a 2.5 x 20 mm balloon. There was significant difficulty in advancing the balloon across the stenosis. The lesion was not dilated with this. Thus, I used a 2.5 x 20 mm noncompliant balloon to 22 atmospheres but this did not dilate the lesion. A 2.75 balloon did not cross the lesion. Also an a 2 mm angioscore balloon did not cross the lesion in spite of using a second wire as a buddy wire. It was clear that the lesion is extremely calcified and fibrotic with inability to dilate. Thus, I decided to abort the procedure. More guiding support is needed from the femoral artery and probably with atherectomy. There were no immediate procedural complications. A TR band was used for  radial hemostasis. The patient was transferred to the post catheterization recovery area for further monitoring.  PCI Data: Vessel - RCA/Segment - proximal and mid Percent Stenosis (pre)  99% TIMI-flow 3 Stent no stent.  Balloon angioplasty only with inability to dilate the lesion. Percent Stenosis (post) 95% TIMI-flow (post) 3   Final Conclusions:  1. Significant 2 vessel coronary artery disease with 99% instent restenosis in the right coronary artery which is heavily fibrotic and calcified. The disease in the LAD the distal. 2. Moderately elevated left ventricular end-diastolic pressure. 3. Unsuccessful angioplasty of the right coronary artery due to inability to dilate the lesion.  Recommendations:   resume heparin 6 hours after the procedure. I will discuss with my colleagues and will likely proceed with attempted angioplasty via the femoral approach for more guiding catheter support with atherectomy. I suspect that the lesion in the RCA is chronic.   [name] MD, [facility] 04/05/2013, 2:27 PM

## 2013-04-05 NOTE — H&P (View-Only) (Signed)
Patient Name: Devin Hanna Date of Encounter: 04/05/2013  Length of Stay: 1  SUBJECTIVE  No chest pain, SOB has improved.  CURRENT MEDS . aspirin  325 mg Oral Daily  . furosemide  60 mg Intravenous BID  . insulin aspart  0-15 Units Subcutaneous TID WC  . insulin aspart  0-5 Units Subcutaneous QHS  . metoprolol tartrate  25 mg Oral BID  . multivitamin with minerals  1 tablet Oral Daily  . pantoprazole  40 mg Oral Daily  . quinapril  20 mg Oral q morning - 10a  . simvastatin  20 mg Oral QPM  . sodium chloride  3 mL Intravenous Q12H   OBJECTIVE  Filed Vitals:   04/04/13 2035 04/04/13 2345 04/05/13 0507 04/05/13 0748  BP:    128/61  Pulse:    77  Temp: 100.2 F (37.9 C) 98.8 F (37.1 C) 97.5 F (36.4 C) 99 F (37.2 C)  TempSrc: Oral Oral Oral Oral  Resp:    16  Height:      Weight:   240 lb 4.8 oz (109 kg)   SpO2:    100%    Intake/Output Summary (Last 24 hours) at 04/05/13 0837 Last data filed at 04/05/13 0800  Gross per 24 hour  Intake  143.5 ml  Output    950 ml  Net -806.5 ml   Filed Weights   04/04/13 0627 04/04/13 0930 04/05/13 0507  Weight: 245 lb (111.131 kg) 241 lb 10 oz (109.6 kg) 240 lb 4.8 oz (109 kg)   PHYSICAL EXAM  General: Pleasant, NAD. Neuro: Alert and oriented X 3. Moves all extremities spontaneously. Psych: Normal affect. HEENT:  Normal  Neck: Supple without bruits or JVD. Lungs:  Resp regular and unlabored, mild crackes B/L. Heart: RRR no s3, s4, or murmurs. Abdomen: Soft, non-tender, non-distended, BS + x 4.  Extremities: No clubbing, cyanosis, trace B/L edema. DP/PT/Radials 2+ and equal bilaterally.  Accessory Clinical Findings  CBC  Recent Labs  04/04/13 0526 04/05/13 0103  WBC 9.7 9.8  NEUTROABS 5.9  --   HGB 15.2 14.8  HCT 45.1 42.0  MCV 94.5 95.2  PLT 198 027   Basic Metabolic Panel  Recent Labs  04/04/13 0526 04/04/13 1020 04/05/13 0103  NA 139  --  141  K 4.0  --  4.0  CL 97  --  97  CO2 23  --  27    GLUCOSE 313*  --  197*  BUN 10  --  16  CREATININE 0.84  --  0.97  CALCIUM 9.2  --  8.7  MG  --  1.7  --    Liver Function Tests  Recent Labs  04/04/13 0526  AST 49*  ALT 43  ALKPHOS 129*  BILITOT 0.6  PROT 7.3  ALBUMIN 3.3*   Cardiac Enzymes  Recent Labs  04/04/13 1020 04/04/13 1550 04/04/13 2210  TROPONINI 1.18* 2.25* 1.91*    Recent Labs  04/04/13 1020  TSH 3.056   Radiology/Studies  Dg Chest Portable 1 View  04/04/2013   CLINICAL DATA:  Shortness of breath and chest pain.  EXAM: PORTABLE CHEST - 1 VIEW  COMPARISON:  Chest radiograph performed 04/14/2012  FINDINGS: Bibasilar airspace opacification may reflect multifocal pneumonia or pulmonary edema. Underlying vascular congestion is seen. No definite pleural effusion or pneumothorax seen.  The cardiomediastinal silhouette is borderline normal in size. No acute osseous abnormalities are identified.  IMPRESSION: Bibasilar airspace opacification may reflect multifocal pneumonia or  pulmonary edema. Underlying vascular congestion seen.   Electronically Signed   By: Garald Balding M.D.   On: 04/04/2013 05:48   TELE: SR, frequent PVCs  ECG: SR, new LBBB  Echo 04/04/2013 Study Conclusions  - Left ventricle: The cavity size was normal. Systolic function was moderately to severely reduced. The estimated ejection fraction was in the range of 30% to 35%. Severe hypokinesis of the inferolateral, inferior, and inferoseptal myocardium. - Ventricular septum: Septal motion showed abnormal function, dyssynergy, and paradox. These changes are consistent with a left bundle branch block. - Left atrium: The atrium was moderately dilated.     ASSESSMENT AND PLAN  Active Problems:   MI (myocardial infarction)   Acute respiratory failure   Acute respiratory failure with hypoxia   CAD (coronary artery disease)   Pulmonary edema  63 year old male with known CAD, h/o cardiac arrest in 1997, with angioplasty to ? Vessel,  multiple PCIs to unknown vessels years later, he hasn't follow in the last 10 years. He presented with SOB and flash pulmonary edema on 04/03/13.  1. NSTEMI - new LBBB yesterday, so technically STEMI, the patient ate this am at 8 am, we will send to cath ASAP- 1 pm, continue ASA 81 mg po daily, metoprolol, change simvastatin to atorvastatin 80 mg po daily, continue heparin drip. He has no chest pain, SOB has improved.  2. Acute systolic CHF - LVEF 61-68% with severe hypokinesis of the inferolateral, inferior, and inferoseptal myocardium, diuresed - 800 in the last 24 hours   3. DM - on insulin SS  4. Hyperlipidemia - on high dose atorvastatin  Signed, Ena Dawley, H MD, [facility] 04/05/2013

## 2013-04-05 NOTE — Progress Notes (Signed)
Inpatient Diabetes Program Recommendations  AACE/ADA: New Consensus Statement on Inpatient Glycemic Control (2013)  Target Ranges:  Prepandial:   less than 140 mg/dL      Peak postprandial:   less than 180 mg/dL (1-2 hours)      Critically ill patients:  140 - 180 mg/dL   Results for APRIL, CARLYON (MRN [id number]) as of 04/05/2013 10:12  Ref. Range 04/04/2013 12:21 04/04/2013 16:21 04/04/2013 21:37 04/05/2013 07:50  Glucose-Capillary Latest Range: 70-99 mg/dL 205 (H) 193 (H) 233 (H) 201 (H)   Diabetes history: DM2 Outpatient Diabetes medications: Metformin 1000 mg BID, Glipizide 10 mg BID Current orders for Inpatient glycemic control: Novolog 0-15 units AC, Novolog 0-5 units HS  Inpatient Diabetes Program Recommendations Insulin - Basal: Please consider ordering low dose basal insulin; recommend starting with Levemir 10 units QHS. HgbA1C: Please order an A1C to evaluate glycemic control over the past 2-3 months.  Note: Blood glucose ranged from 193-233 mg/dl on 2/24 and fasting glucose is 201 mg/dl this morning.  Noted in the chart, patient was hospitalized 04/03/12 to 04/06/2012 and during admission patient was receiving basal insulin to maintain glucose.  According to discharge summary by Dr. 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  Please consider ordering low dose basal; recommend starting with Levemir 10 units QHS and adjust accordingly.  Also, please order an A1C.    Thanks, Barnie Alderman, RN, MSN, CCRN Diabetes Coordinator Inpatient Diabetes Program [phone] (Team Pager) [phone] ([facility] office) [phone] [facility] office)

## 2013-04-05 NOTE — Progress Notes (Signed)
ANTICOAGULATION CONSULT NOTE - Follow Up Consult  Pharmacy Consult for Heparin Indication: chest pain/ACS  No Known Allergies  Patient Measurements: Height: 5' 7"  (170.2 cm) Weight: 240 lb 4.8 oz (109 kg) IBW/kg (Calculated) : 66.1 Heparin Dosing Weight: 91kg  Vital Signs: Temp: 99.2 F (37.3 C) (02/25 1148) Temp src: Oral (02/25 1148) BP: 111/55 mmHg (02/25 1148) Pulse Rate: 76 (02/25 1226)  Labs:  Recent Labs  04/04/13 0526 04/04/13 0700  04/04/13 1020 04/04/13 1550  04/04/13 2210 04/05/13 0103 04/05/13 0645 04/05/13 0736  HGB 15.2  --   --   --   --   --   --  14.8  --   --   HCT 45.1  --   --   --   --   --   --  42.0  --   --   PLT 198  --   --   --   --   --   --  179  --   --   APTT  --  32  --   --   --   --   --   --   --   --   LABPROT  --  13.8  --   --   --   --   --   --   --   --   INR  --  1.08  --   --   --   --   --   --   --   --   HEPARINUNFRC  --  <0.10*  --   --   --   < >  --  0.30 0.28* 0.25*  CREATININE 0.84  --   --   --   --   --   --  0.97  --   --   TROPONINI  --   --   < > 1.18* 2.25*  --  1.91*  --   --   --   < > = values in this interval not displayed.  Estimated Creatinine Clearance: 89.5 ml/min (by C-G formula based on Cr of 0.97).   Medications:  Heparin 1750 units/hr  Assessment: 71 yom on heparin for NSTEMI. Now s/p cath with unsuccessful angioplasty of RCA to restart heparin 6 hours after TR band removal. Per nurse, TR band will likely be removed at around 1645 so will schedule to restart at 2300.  CBC remains wnl and stable with no significant bleeding reported.  Goal of Therapy:  Heparin level 0.3-0.7 units/ml Monitor platelets by anticoagulation protocol: Yes   Plan:  - Restart heparin drip 1750 units/hr at 2300 - Check heparin level 6 hours after restart - Continue to monitor CBC and s/s bleeding - F/u plans to re-attempt PCI via femoral approach  Harolyn Rutherford, [name] Clinical Pharmacist - Resident Pager:  [phone] Pharmacy: [phone] 04/05/2013 3:40 PM

## 2013-04-05 NOTE — Progress Notes (Signed)
ANTICOAGULATION CONSULT NOTE - Follow Up Consult  Pharmacy Consult for Heparin Indication: chest pain/ACS  No Known Allergies  Patient Measurements: Height: 5' 7"  (170.2 cm) Weight: 240 lb 4.8 oz (109 kg) IBW/kg (Calculated) : 66.1 Heparin Dosing Weight: 91kg  Vital Signs: Temp: 99 F (37.2 C) (02/25 0748) Temp src: Oral (02/25 0748) BP: 128/61 mmHg (02/25 0748) Pulse Rate: 77 (02/25 0748)  Labs:  Recent Labs  04/04/13 0526 04/04/13 0700  04/04/13 1020 04/04/13 1550  04/04/13 2210 04/05/13 0103 04/05/13 0645 04/05/13 0736  HGB 15.2  --   --   --   --   --   --  14.8  --   --   HCT 45.1  --   --   --   --   --   --  42.0  --   --   PLT 198  --   --   --   --   --   --  179  --   --   APTT  --  32  --   --   --   --   --   --   --   --   LABPROT  --  13.8  --   --   --   --   --   --   --   --   INR  --  1.08  --   --   --   --   --   --   --   --   HEPARINUNFRC  --  <0.10*  --   --   --   < >  --  0.30 0.28* 0.25*  CREATININE 0.84  --   --   --   --   --   --  0.97  --   --   TROPONINI  --   --   < > 1.18* 2.25*  --  1.91*  --   --   --   < > = values in this interval not displayed.  Estimated Creatinine Clearance: 89.5 ml/min (by C-G formula based on Cr of 0.97).   Medications:  Heparin 1550 units/hr  Assessment: 63 yom on heparin for CP/ACS. Heparin level (0.25) remains subtherapeutic. H/H and plts wnl, no significant bleeding reported, no problems with line/infusion per RN.  Goal of Therapy:  Heparin level 0.3-0.7 units/ml Monitor platelets by anticoagulation protocol: Yes   Plan:  1. Increase heparin drip to 1750 units/hr (increase of ~ 2 units/kg/hr) 2. Check heparin level 6 hours after rate increase 3. Continue to monitor CBC and s/s bleeding  Harolyn Rutherford, [name] Clinical Pharmacist - Resident Pager: [phone] Pharmacy: [phone] 04/05/2013 10:33 AM

## 2013-04-06 ENCOUNTER — Encounter (HOSPITAL_COMMUNITY)
Admission: EM | Disposition: A | Discharge status: Home / Self Care | Payer: Self-pay | Source: Home / Self Care | Attending: Internal Medicine

## 2013-04-06 DIAGNOSIS — I2510 Atherosclerotic heart disease of native coronary artery without angina pectoris: Secondary | ICD-10-CM

## 2013-04-06 DIAGNOSIS — I214 Non-ST elevation (NSTEMI) myocardial infarction: Secondary | ICD-10-CM | POA: Diagnosis not present

## 2013-04-06 DIAGNOSIS — I219 Acute myocardial infarction, unspecified: Secondary | ICD-10-CM

## 2013-04-06 DIAGNOSIS — J9600 Acute respiratory failure, unspecified whether with hypoxia or hypercapnia: Secondary | ICD-10-CM | POA: Diagnosis not present

## 2013-04-06 HISTORY — PX: PERCUTANEOUS CORONARY ROTOBLATOR INTERVENTION (PCI-R): SHX5484

## 2013-04-06 LAB — GLUCOSE, CAPILLARY
GLUCOSE-CAPILLARY: 166 mg/dL — AB (ref 70–99)
Glucose-Capillary: 211 mg/dL — ABNORMAL HIGH (ref 70–99)
Glucose-Capillary: 134 mg/dL — ABNORMAL HIGH (ref 70–99)

## 2013-04-06 LAB — POCT ACTIVATED CLOTTING TIME
ACTIVATED CLOTTING TIME: 232 s
ACTIVATED CLOTTING TIME: 315 s
Activated Clotting Time: 276 seconds
Activated Clotting Time: 310 seconds
Activated Clotting Time: 171 seconds
Activated Clotting Time: 160 seconds
Activated Clotting Time: 171 seconds

## 2013-04-06 LAB — HEMOGLOBIN A1C
HEMOGLOBIN A1C: 7.5 % — AB (ref <5.7)
MEAN PLASMA GLUCOSE: 169 mg/dL — AB (ref <117)

## 2013-04-06 LAB — BASIC METABOLIC PANEL
BUN: 13 mg/dL (ref 6–23)
CO2: 27 mEq/L (ref 19–32)
Calcium: 8.4 mg/dL (ref 8.4–10.5)
Chloride: 102 mEq/L (ref 96–112)
Creatinine, Ser: 0.9 mg/dL (ref 0.50–1.35)
GFR calc Af Amer: >90 mL/min (ref >90)
GFR, EST NON AFRICAN AMERICAN: 87 mL/min — AB (ref >90)
Glucose, Bld: 205 mg/dL — ABNORMAL HIGH (ref 70–99)
POTASSIUM: 3.9 meq/L (ref 3.7–5.3)
Sodium: 141 mEq/L (ref 137–147)

## 2013-04-06 LAB — CBC
HCT: 40.4 % (ref 39.0–52.0)
Hemoglobin: 14 g/dL (ref 13.0–17.0)
MCH: 32.7 pg (ref 26.0–34.0)
MCHC: 34.7 g/dL (ref 30.0–36.0)
MCV: 94.4 fL (ref 78.0–100.0)
PLATELETS: 166 10*3/uL (ref 150–400)
RBC: 4.28 MIL/uL (ref 4.22–5.81)
RDW: 14.1 % (ref 11.5–15.5)
WBC: 7.3 10*3/uL (ref 4.0–10.5)

## 2013-04-06 LAB — URINE CULTURE
COLONY COUNT: NO GROWTH
Culture: NO GROWTH
SPECIAL REQUESTS: NORMAL

## 2013-04-06 LAB — HEPARIN LEVEL (UNFRACTIONATED): Heparin Unfractionated: 0.22 IU/mL — ABNORMAL LOW (ref 0.30–0.70)

## 2013-04-06 SURGERY — PERCUTANEOUS CORONARY ROTOBLATOR INTERVENTION (PCI-R)
Anesthesia: LOCAL

## 2013-04-06 MED ORDER — HEPARIN SODIUM (PORCINE) 1000 UNIT/ML IJ SOLN
INTRAMUSCULAR | Status: AC
  Filled 2013-04-06: qty 1

## 2013-04-06 MED ORDER — ENOXAPARIN SODIUM 40 MG/0.4ML ~~LOC~~ SOLN: 40.0000 mg | SUBCUTANEOUS | Status: DC

## 2013-04-06 MED ORDER — ENOXAPARIN SODIUM 60 MG/0.6ML ~~LOC~~ SOLN
50.0000 mg | SUBCUTANEOUS | Status: DC
  Administered 2013-04-07: 50 mg via SUBCUTANEOUS
  Filled 2013-04-06 (×2): qty 0.6

## 2013-04-06 MED ORDER — ATORVASTATIN CALCIUM 80 MG PO TABS
80.0000 mg | ORAL_TABLET | Freq: Every day | ORAL | Status: DC
  Administered 2013-04-06: 80 mg via ORAL
  Filled 2013-04-06 (×2): qty 1

## 2013-04-06 MED ORDER — VERAPAMIL HCL 2.5 MG/ML IV SOLN
INTRAVENOUS | Status: AC
  Filled 2013-04-06: qty 2

## 2013-04-06 MED ORDER — LIDOCAINE HCL (PF) 1 % IJ SOLN
INTRAMUSCULAR | Status: AC
  Filled 2013-04-06: qty 30

## 2013-04-06 MED ORDER — SODIUM CHLORIDE 0.9 % IV SOLN
INTRAVENOUS | Status: DC
  Administered 2013-04-06: 09:00:00 via INTRAVENOUS

## 2013-04-06 MED ORDER — NITROGLYCERIN 0.2 MG/ML ON CALL CATH LAB
INTRAVENOUS | Status: AC
  Filled 2013-04-06: qty 1

## 2013-04-06 MED ORDER — ADENOSINE 6 MG/2ML IV SOLN
INTRAVENOUS | Status: AC
  Filled 2013-04-06: qty 2

## 2013-04-06 MED ORDER — CLOPIDOGREL BISULFATE 300 MG PO TABS
ORAL_TABLET | ORAL | Status: AC
  Filled 2013-04-06: qty 1

## 2013-04-06 MED ORDER — INSULIN DETEMIR 100 UNIT/ML ~~LOC~~ SOLN
20.0000 [IU] | Freq: Every day | SUBCUTANEOUS | Status: DC
  Administered 2013-04-06: 20 [IU] via SUBCUTANEOUS
  Filled 2013-04-06 (×2): qty 0.2

## 2013-04-06 MED ORDER — CLOPIDOGREL BISULFATE 300 MG PO TABS
300.0000 mg | ORAL_TABLET | Freq: Once | ORAL | Status: AC
  Administered 2013-04-06: 300 mg via ORAL
  Filled 2013-04-06: qty 1

## 2013-04-06 MED ORDER — METOPROLOL TARTRATE 50 MG PO TABS
50.0000 mg | ORAL_TABLET | Freq: Two times a day (BID) | ORAL | Status: DC
  Administered 2013-04-06 – 2013-04-07 (×2): 50 mg via ORAL
  Filled 2013-04-06 (×3): qty 1

## 2013-04-06 MED ORDER — MIDAZOLAM HCL 2 MG/2ML IJ SOLN
INTRAMUSCULAR | Status: AC
  Filled 2013-04-06: qty 2

## 2013-04-06 MED ORDER — FENTANYL CITRATE 0.05 MG/ML IJ SOLN
INTRAMUSCULAR | Status: AC
  Filled 2013-04-06: qty 2

## 2013-04-06 MED ORDER — SODIUM CHLORIDE 0.9 % IV SOLN
INTRAVENOUS | Status: AC
  Administered 2013-04-06: 75 mL/h via INTRAVENOUS

## 2013-04-06 MED ORDER — HEPARIN (PORCINE) IN NACL 2-0.9 UNIT/ML-% IJ SOLN
INTRAMUSCULAR | Status: AC
  Filled 2013-04-06: qty 1000

## 2013-04-06 NOTE — Progress Notes (Signed)
[place] TEAM 1 - Stepdown/ICU TEAM Progress Note  LENWARD ABLE ZWC:[id number] DOB: 08/03/1950 DOA: 04/04/2013 PCP: Ronald Lobo, MD  Admit HPI / Brief Narrative: 63 y.o. male w/ history of CAD status post stents, history of MI, DM, and hypertension who presented w/ c/o sudden shortness of breath that awoke him from sleep. Patient denied any fevers or coughing.   While in the ED the patient had portable chest x-ray which reported bibasilar airspace opacification that may reflect multifocal pneumonia or pulmonary edema. Patient's condition improved after Lasix and BiPAP.   HPI/Subjective: Pt resting in bed- no complaints.  Assessment/Plan:  STEMI (new LBBB) w/ History of CAD status post stents  Cardiac cath today revealed significant 2 vessel coronary artery disease with 99% instent restenosis in the right coronary artery (heavily fibrotic and calcified) with a 60-70% distal LAD stenosis - care as per cardiology  - atherectomy, cutting balloon angioplasty and DES stent to RCA  Acute systolic congestive heart failure LVEF 30-35% with severe hypokinesis of the inferolateral, inferior, and inferoseptal myocardium - management per cardiology  Acute respiratory failure with hypoxia Due to above - improving with diuresis  DM CBG poorly controlled - check A1c (pending) - increase Lantus HLD Continue medical management  Code Status: FULL Family Communication: no family present at time of exam Disposition Plan: SDU  Consultants: Cardiology  Procedures: TTE - results as noted above Cardiac cath 2/25 - results as noted above  Antibiotics: None  DVT prophylaxis: IV heparin  Objective: Blood pressure 132/66, pulse 71, temperature 98.2 F (36.8 C), temperature source Oral, resp. rate 16, height 5' 7"  (1.702 m), weight 107.9 kg (237 lb 14 oz), SpO2 98.00%.  Intake/Output Summary (Last 24 hours) at 04/06/13 1545 Last data filed at 04/06/13 1240  Gross per 24 hour    Intake    708 ml  Output   1375 ml  Net   -667 ml   Exam: General: No acute respiratory distress at rest in bed Lungs: Mild bibasilar crackles with good air movement throughout other fields with no wheeze Cardiovascular: Regular rate and rhythm without murmur gallop or rub normal S1 and S2 Abdomen: Nontender, nondistended, soft, bowel sounds positive, no rebound, no ascites, no appreciable mass Extremities: No significant cyanosis, or clubbing;  1+ edema bilateral lower extremities  Data Reviewed: Basic Metabolic Panel:  Recent Labs Lab 04/04/13 0526 04/04/13 1020 04/05/13 0103 04/06/13 0805  NA 139  --  141 141  K 4.0  --  4.0 3.9  CL 97  --  97 102  CO2 23  --  27 27  GLUCOSE 313*  --  197* 205*  BUN 10  --  16 13  CREATININE 0.84  --  0.97 0.90  CALCIUM 9.2  --  8.7 8.4  MG  --  1.7  --   --    Liver Function Tests:  Recent Labs Lab 04/04/13 0526  AST 49*  ALT 43  ALKPHOS 129*  BILITOT 0.6  PROT 7.3  ALBUMIN 3.3*   CBC:  Recent Labs Lab 04/04/13 0526 04/05/13 0103 04/06/13 0805  WBC 9.7 9.8 7.3  NEUTROABS 5.9  --   --   HGB 15.2 14.8 14.0  HCT 45.1 42.0 40.4  MCV 94.5 95.2 94.4  PLT 198 179 166   Cardiac Enzymes:  Recent Labs Lab 04/04/13 0744 04/04/13 1020 04/04/13 1550 04/04/13 2210  TROPONINI 0.85* 1.18* 2.25* 1.91*   BNP (last 3 results)  Recent Labs  04/04/13 0526  PROBNP 272.5*   CBG:  Recent Labs Lab 04/05/13 1148 04/05/13 1637 04/05/13 2126 04/06/13 0738 04/06/13 1208  GLUCAP 182* 230* 237* 211* 166*    Recent Results (from the past 240 hour(s))  MRSA PCR SCREENING     Status: None   Collection Time    04/04/13 10:06 AM      Result Value Ref Range Status   MRSA by PCR NEGATIVE  NEGATIVE Final   Comment:            The GeneXpert MRSA Assay (FDA     approved for NASAL specimens     only), is one component of a     comprehensive MRSA colonization     surveillance program. It is not     intended to diagnose MRSA      infection nor to guide or     monitor treatment for     MRSA infections.     Studies:  Recent x-ray studies have been reviewed in detail by the Attending Physician  Scheduled Meds:  Scheduled Meds: . aspirin  325 mg Oral Daily  . atorvastatin  80 mg Oral q1800  . clopidogrel  75 mg Oral Q breakfast  . furosemide  60 mg Intravenous BID  . insulin aspart  0-15 Units Subcutaneous TID WC  . insulin aspart  0-5 Units Subcutaneous QHS  . insulin detemir  16 Units Subcutaneous QHS  . metoprolol tartrate  25 mg Oral BID  . multivitamin with minerals  1 tablet Oral Daily  . pantoprazole  40 mg Oral Daily  . quinapril  20 mg Oral q morning - 10a  . sodium chloride  3 mL Intravenous Q12H    Time spent on care of this patient: 35 mins   [place]  [phone] Pager - Text Page per Shea Evans as per below:  On-Call/Text Page:      [email or web address]      password [email or web address]  If 7PM-7AM, please contact night-coverage [email or web address] Password [email or web address] 04/06/2013, 3:45 PM   LOS: 2 days

## 2013-04-06 NOTE — Progress Notes (Signed)
Transferred to the cath lab by bed, stable.

## 2013-04-06 NOTE — Progress Notes (Signed)
Inpatient Diabetes Program Recommendations  AACE/ADA: New Consensus Statement on Inpatient Glycemic Control (2013)  Target Ranges:  Prepandial:   less than 140 mg/dL      Peak postprandial:   less than 180 mg/dL (1-2 hours)      Critically ill patients:  140 - 180 mg/dL   Inpatient Diabetes Program Recommendations Insulin - Basal: increase Levemir to 25 HgbA1C: . Thank you  Raoul Pitch BSN, RN,CDE Inpatient Diabetes Coordinator [phone] (team pager)

## 2013-04-06 NOTE — CV Procedure (Signed)
   CARDIAC CATH NOTE  Name: Devin Hanna MRN: [id number] DOB: [birth year]  Procedure: Prophylactic Transvenous temporary pacemaker placement,  Rotational atherectomy, cutting balloon angioplasty and drug-eluting stent placement to the proximal right coronary artery  Indication: Non-ST elevation myocardial infarction with inability to dilate the lesion with balloon angioplasty.  Medications:  Sedation:  2 mg IV Versed,  50 mcg IV Fentanyl  Contrast:  145 ML Omnipaque  Procedural Details: The right groin was prepped, draped, and anesthetized with 1% lidocaine. Using the modified Seldinger technique, a 6 Fr sheath was introduced into the right femoral vein. And a 7 French sheath was placed in the right femoral artery. The patient was given 7000 of unfractionated heparin. In addition of 2000 of unfractionated heparin was given to maintain an ACT above 250. A temporary transvenous pacemaker was placed via the right femoral vein. Once a therapeutic ACT was achieved, a [place] 0.75 guide catheter was inserted.  A long run through coronary guidewire was used to cross the lesion with an over-the-wire balloon. The wire was exchanged into the Rotafloppy wire. Rotational atherectomy was performed with a 1.5 mm bur with improved results. I then used a 3 x 10 mm cutting balloon to dilate the lesion but was not able to fully dilate the lesion even after atherectomy. Thus, I decided to use a 2 mm bur with good results and 50% residual stenosis. At this point, I used a 3.5 x 10 mm cutting balloon with multiple inflations performed to high pressure.   The lesion was then stented with a 3.0 x 38 mm Promus drug-eluting stent which was deployed to 18 atmospheres. I then used a 3.5 x 15 mm noncompliant balloon to post dilate the lesion to high pressure (14 atmosphere at the edges and 18 atmospheres in the midsegment).    Following PCI, there was 0% residual stenosis and TIMI-3 flow. Final angiography confirmed an  excellent result. The patient tolerated the procedure well. There were no immediate procedural complications. Femoral hemostasis was achieved with manual pressure. The patient was transferred to the post catheterization recovery area for further monitoring.  PCI Data: Vessel - RCA/Segment - proximal and mid Percent Stenosis (pre)  99% TIMI-flow 3 Stent 3.0 x 38 mm Promus drug-eluting stent postdilated with a 3.5 noncompliant balloon Percent Stenosis (post) 0% TIMI-flow (post) 3  Final Conclusions:  Successful rotational atherectomy, cutting balloon angioplasty and drug-eluting stent placement to the proximal right coronary artery extending into the midsegment.  Recommendations:  Continue dual antiplatelet therapy for at least 12 months. Optimize heart failure management. I increased the dose of metoprolol. This can be switched to extended release metoprolol upon discharge. I switched IV furosemide to by mouth. This was a difficult and prolonged procedure.  [name] MD, [facility] 04/06/2013, 3:32 PM

## 2013-04-06 NOTE — Progress Notes (Signed)
Back from the cath lab awake and alert. Instructed to be on bedrest for 6 hours. Call for any sign of bleeding on the right groin. Continue to monitor.

## 2013-04-06 NOTE — Progress Notes (Signed)
Patient Name: Devin Hanna Date of Encounter: 04/06/2013  Length of Stay: 2  SUBJECTIVE  No chest pain, SOB has improved. No pain in the right arm.   CURRENT MEDS . aspirin  325 mg Oral Daily  . clopidogrel  75 mg Oral Q breakfast  . furosemide  60 mg Intravenous BID  . insulin aspart  0-15 Units Subcutaneous TID WC  . insulin aspart  0-5 Units Subcutaneous QHS  . insulin detemir  16 Units Subcutaneous QHS  . metoprolol tartrate  25 mg Oral BID  . multivitamin with minerals  1 tablet Oral Daily  . pantoprazole  40 mg Oral Daily  . quinapril  20 mg Oral q morning - 10a  . simvastatin  20 mg Oral QPM  . sodium chloride  3 mL Intravenous Q12H   OBJECTIVE  Filed Vitals:   04/06/13 0022 04/06/13 0418 04/06/13 0500 04/06/13 0730  BP:  123/67  157/71  Pulse:  75  77  Temp: 98.1 F (36.7 C) 98.4 F (36.9 C)  98.7 F (37.1 C)  TempSrc: Oral Oral  Oral  Resp:  15  17  Height:      Weight:   237 lb 14 oz (107.9 kg)   SpO2:  96%  98%    Intake/Output Summary (Last 24 hours) at 04/06/13 0809 Last data filed at 04/06/13 0700  Gross per 24 hour  Intake  573.5 ml  Output   1175 ml  Net -601.5 ml   Filed Weights   04/04/13 0930 04/05/13 0507 04/06/13 0500  Weight: 241 lb 10 oz (109.6 kg) 240 lb 4.8 oz (109 kg) 237 lb 14 oz (107.9 kg)   PHYSICAL EXAM  General: Pleasant, NAD. Neuro: Alert and oriented X 3. Moves all extremities spontaneously. Psych: Normal affect. HEENT:  Normal  Neck: Supple without bruits or JVD. Lungs:  Resp regular and unlabored, mild crackes B/L. Heart: RRR no s3, s4, or murmurs. Abdomen: Soft, non-tender, non-distended, BS + x 4.  Extremities: No clubbing, cyanosis, trace B/L edema. DP/PT/Radials 2+ and equal bilaterally.  Accessory Clinical Findings  CBC  Recent Labs  04/04/13 0526 04/05/13 0103  WBC 9.7 9.8  NEUTROABS 5.9  --   HGB 15.2 14.8  HCT 45.1 42.0  MCV 94.5 95.2  PLT 198 149   Basic Metabolic Panel  Recent Labs  04/04/13 0526 04/04/13 1020 04/05/13 0103  NA 139  --  141  K 4.0  --  4.0  CL 97  --  97  CO2 23  --  27  GLUCOSE 313*  --  197*  BUN 10  --  16  CREATININE 0.84  --  0.97  CALCIUM 9.2  --  8.7  MG  --  1.7  --    Liver Function Tests  Recent Labs  04/04/13 0526  AST 49*  ALT 43  ALKPHOS 129*  BILITOT 0.6  PROT 7.3  ALBUMIN 3.3*   Cardiac Enzymes  Recent Labs  04/04/13 1020 04/04/13 1550 04/04/13 2210  TROPONINI 1.18* 2.25* 1.91*    Recent Labs  04/04/13 1020  TSH 3.056   Radiology/Studies  Dg Chest Portable 1 View  04/04/2013   CLINICAL DATA:  Shortness of breath and chest pain.  EXAM: PORTABLE CHEST - 1 VIEW  COMPARISON:  Chest radiograph performed 04/14/2012  FINDINGS: Bibasilar airspace opacification may reflect multifocal pneumonia or pulmonary edema. Underlying vascular congestion is seen. No definite pleural effusion or pneumothorax seen.  The cardiomediastinal silhouette  is borderline normal in size. No acute osseous abnormalities are identified.  IMPRESSION: Bibasilar airspace opacification may reflect multifocal pneumonia or pulmonary edema. Underlying vascular congestion seen.   Electronically Signed   By: Garald Balding M.D.   On: 04/04/2013 05:48   TELE: SR, frequent PVCs  ECG: SR, new LBBB  Echo 04/04/2013 Study Conclusions  - Left ventricle: The cavity size was normal. Systolic function was moderately to severely reduced. The estimated ejection fraction was in the range of 30% to 35%. Severe hypokinesis of the inferolateral, inferior, and inferoseptal myocardium. - Ventricular septum: Septal motion showed abnormal function, dyssynergy, and paradox. These changes are consistent with a left bundle branch block. - Left atrium: The atrium was moderately dilated.     ASSESSMENT AND PLAN  Active Problems:   MI (myocardial infarction)   Acute respiratory failure   Acute respiratory failure with hypoxia   CAD (coronary artery disease)    Pulmonary edema  63 year old male with known CAD, h/o cardiac arrest in 1997, with angioplasty, he hasn't follow in the last 10 years. He presented with SOB and flash pulmonary edema on 04/03/13.  1. NSTEMI - new LBBB yesterday, the patient underwent cardiac catheterization yesterday with findings significant 2 vessel coronary artery disease with 99% instent restenosis in the right coronary artery which is heavily fibrotic and calcified, the disease in the LAD the distal,  moderately elevated left ventricular end-diastolic pressure. Unsuccessful angioplasty of the right coronary artery due to inability to dilate the lesion. Today plan :  proceed with attempted angioplasty via the femoral approach for more guiding catheter support with atherectomy. I suspect that the lesion in the RCA is chronic. On iv Heparin drip.   2. Acute systolic CHF - LVEF 24-40% with severe hypokinesis of the inferolateral, inferior, and inferoseptal myocardium, diuresed - 800 in the last 24 hours   3. DM - on insulin SS  4. Hyperlipidemia - on high dose atorvastatin  Signed, Ena Dawley, H MD, [facility] 04/06/2013

## 2013-04-06 NOTE — Progress Notes (Signed)
CARDIAC REHAB PHASE I   Pt in bed upon arrival.  Completed nutrition education, risk factors, procedure education, NTG use and activity level and progression with pt and daughter.  Pt voiced understanding and did not have any questions at this time. [id number]    Lillia Dallas MS, ACSM RCEP 11:04 AM 04/06/2013

## 2013-04-06 NOTE — Progress Notes (Signed)
ANTICOAGULATION CONSULT NOTE - Follow Up Consult  Pharmacy Consult for Heparin Indication: chest pain/ACS  No Known Allergies  Patient Measurements: Height: 5' 7"  (170.2 cm) Weight: 237 lb 14 oz (107.9 kg) IBW/kg (Calculated) : 66.1 Heparin Dosing Weight: 91kg  Vital Signs: Temp: 98.7 F (37.1 C) (02/26 0730) Temp src: Oral (02/26 0730) BP: 157/71 mmHg (02/26 0730) Pulse Rate: 77 (02/26 0730)  Labs:  Recent Labs  04/04/13 0526 04/04/13 0700  04/04/13 1020 04/04/13 1550  04/04/13 2210 04/05/13 0103 04/05/13 0645 04/05/13 0736 04/06/13 0805  HGB 15.2  --   --   --   --   --   --  14.8  --   --  14.0  HCT 45.1  --   --   --   --   --   --  42.0  --   --  40.4  PLT 198  --   --   --   --   --   --  179  --   --  166  APTT  --  32  --   --   --   --   --   --   --   --   --   LABPROT  --  13.8  --   --   --   --   --   --   --   --   --   INR  --  1.08  --   --   --   --   --   --   --   --   --   HEPARINUNFRC  --  <0.10*  --   --   --   < >  --  0.30 0.28* 0.25* 0.22*  CREATININE 0.84  --   --   --   --   --   --  0.97  --   --  0.90  TROPONINI  --   --   < > 1.18* 2.25*  --  1.91*  --   --   --   --   < > = values in this interval not displayed.  Estimated Creatinine Clearance: 95.8 ml/min (by C-G formula based on Cr of 0.9).   Medications:  Heparin 1750 units/hr  Assessment: 4 yom on heparin for NSTEMI. Now s/p cath with unsuccessful angioplasty of RCA to restart heparin. HL this am is SUBtherapeutic at 0.22. Per nurse, no issues with the line and no noted s/s bleeding. CBC remains wnl and stable.   Goal of Therapy:  Heparin level 0.3-0.7 units/ml Monitor platelets by anticoagulation protocol: Yes   Plan:   - Increase heparin drip to 1950 units/hr (increase of ~2 units/kg/hr) - Check heparin level 6 hours after restart - Continue to monitor CBC and s/s bleeding - F/u plans to re-attempt PCI via femoral approach today  Harolyn Rutherford, [name] Clinical  Pharmacist - Resident Pager: [phone] Pharmacy: [phone] 04/06/2013 9:07 AM

## 2013-04-07 ENCOUNTER — Telehealth: Payer: Self-pay | Admitting: Cardiology

## 2013-04-07 DIAGNOSIS — E785 Hyperlipidemia, unspecified: Secondary | ICD-10-CM | POA: Diagnosis present

## 2013-04-07 DIAGNOSIS — E119 Type 2 diabetes mellitus without complications: Secondary | ICD-10-CM | POA: Diagnosis present

## 2013-04-07 DIAGNOSIS — I10 Essential (primary) hypertension: Secondary | ICD-10-CM | POA: Diagnosis present

## 2013-04-07 LAB — BASIC METABOLIC PANEL
BUN: 11 mg/dL (ref 6–23)
CO2: 23 mEq/L (ref 19–32)
Calcium: 8.2 mg/dL — ABNORMAL LOW (ref 8.4–10.5)
Chloride: 101 mEq/L (ref 96–112)
Creatinine, Ser: 0.85 mg/dL (ref 0.50–1.35)
GFR calc Af Amer: >90 mL/min (ref >90)
GFR, EST NON AFRICAN AMERICAN: 89 mL/min — AB (ref >90)
GLUCOSE: 192 mg/dL — AB (ref 70–99)
Potassium: 3.9 mEq/L (ref 3.7–5.3)
Sodium: 137 mEq/L (ref 137–147)

## 2013-04-07 LAB — GLUCOSE, CAPILLARY
Glucose-Capillary: 228 mg/dL — ABNORMAL HIGH (ref 70–99)
Glucose-Capillary: 173 mg/dL — ABNORMAL HIGH (ref 70–99)
Glucose-Capillary: 233 mg/dL — ABNORMAL HIGH (ref 70–99)

## 2013-04-07 MED ORDER — METOPROLOL SUCCINATE ER 50 MG PO TB24: ORAL_TABLET | ORAL | Status: DC

## 2013-04-07 MED ORDER — INSULIN ASPART 100 UNIT/ML FLEXPEN: 2.0000 [IU] | PEN_INJECTOR | Freq: Three times a day (TID) | SUBCUTANEOUS | Status: DC

## 2013-04-07 MED ORDER — SIMVASTATIN 20 MG PO TABS: 40.0000 mg | ORAL_TABLET | Freq: Every evening | ORAL | Status: DC

## 2013-04-07 MED ORDER — ASPIRIN 81 MG PO TBEC: 81.0000 mg | DELAYED_RELEASE_TABLET | Freq: Every day | ORAL | Status: DC

## 2013-04-07 MED ORDER — METOPROLOL SUCCINATE ER 50 MG PO TB24: 75.0000 mg | ORAL_TABLET | Freq: Every day | ORAL | Status: DC

## 2013-04-07 MED ORDER — INSULIN DETEMIR 100 UNIT/ML ~~LOC~~ SOLN: 20.0000 [IU] | Freq: Every day | SUBCUTANEOUS | Status: DC

## 2013-04-07 MED ORDER — GLIPIZIDE 10 MG PO TABS: 5.0000 mg | ORAL_TABLET | Freq: Every day | ORAL | Status: DC

## 2013-04-07 MED ORDER — ASPIRIN EC 81 MG PO TBEC: 81.0000 mg | DELAYED_RELEASE_TABLET | Freq: Every day | ORAL | Status: DC

## 2013-04-07 MED ORDER — IBUPROFEN 400 MG PO TABS
400.0000 mg | ORAL_TABLET | Freq: Four times a day (QID) | ORAL | Status: DC | PRN
  Filled 2013-04-07: qty 1

## 2013-04-07 MED ORDER — INSULIN DETEMIR 100 UNIT/ML FLEXPEN: 20.0000 [IU] | PEN_INJECTOR | Freq: Every day | SUBCUTANEOUS | Status: DC

## 2013-04-07 MED ORDER — INSULIN PEN NEEDLE 30G X 8 MM MISC: 1.0000 | Status: DC | PRN

## 2013-04-07 MED ORDER — CLOPIDOGREL BISULFATE 75 MG PO TABS: 75.0000 mg | ORAL_TABLET | Freq: Every day | ORAL | Status: DC

## 2013-04-07 MED ORDER — METOPROLOL SUCCINATE ER 50 MG PO TB24: 50.0000 mg | ORAL_TABLET | Freq: Every day | ORAL | Status: DC

## 2013-04-07 MED FILL — Sodium Chloride IV Soln 0.9%: INTRAVENOUS | Qty: 50 | Status: AC

## 2013-04-07 NOTE — Progress Notes (Signed)
Discharged home accompanied by daughter, prescription and discharge instructions given to pt. Belongings taken home.

## 2013-04-07 NOTE — Progress Notes (Signed)
CARDIAC REHAB PHASE I   PRE:  Rate/Rhythm: 53 SR with PVCs    BP: sitting 134/64    SaO2:   MODE:  Ambulation: 390 ft   POST:  Rate/Rhythm: 86 with PACs    BP: sitting 130/96     SaO2:   Tolerated well, no c/o. PVCs at rest, PACs with activity. Reviewed ed and discussed CRPII. Pt works too much to do [employer]. Ready for d/c. [id number]   Josephina Shih [place] CES, ACSM 04/07/2013 11:18 AM

## 2013-04-07 NOTE — Telephone Encounter (Signed)
New message   7 days tcm with lori g on  3/6 . Per danya dunn pa. gd

## 2013-04-07 NOTE — Progress Notes (Signed)
Pt will follow up with Truitt Merle NP [facility] - 04/14/13 at 11:30am for transition of care visit. Fairy Ashlock PA-C

## 2013-04-07 NOTE — Progress Notes (Addendum)
Physician Discharge Summary  GER RINGENBERG ZOX:[id number] DOB: 1950-10-07 DOA: 04/04/2013  PCP: Ronald Lobo, MD  Admit date: 04/04/2013 Discharge date: 04/07/2013  Time spent: >45 minutes    Discharge Diagnoses:  Principal Problem:   MI (myocardial infarction) Active Problems:   Acute respiratory failure   Acute respiratory failure with hypoxia   CAD (coronary artery disease)   Pulmonary edema   DM type 2 (diabetes mellitus, type 2)   Other and unspecified hyperlipidemia   Morbid obesity   HTN (hypertension)   Discharge Condition: stable  Diet recommendation: heart healthy, diabetic  Filed Weights   04/05/13 0507 04/06/13 0500 04/07/13 0500  Weight: 109 kg (240 lb 4.8 oz) 107.9 kg (237 lb 14 oz) 108.8 kg (239 lb 13.8 oz)    History of present illness:  63 y.o. male w/ history of CAD status post stents, history of MI, DM, and hypertension who presented w/ c/o sudden shortness of breath that awoke him from sleep. Patient denied any fevers or coughing.  While in the ED the patient had portable chest x-ray which reported bibasilar airspace opacification that may reflect multifocal pneumonia or pulmonary edema. Patient's condition improved after Lasix and BiPAP.   Hospital Course:  STEMI (new LBBB) w/ History of CAD status post stents  Cardiac cath revealed significant 2 vessel coronary artery disease with 99% instent restenosis in the right coronary artery (heavily fibrotic and calcified) with a 60-70% distal LAD stenosis - - repeat Cath with atherectomy, cutting balloon angioplasty and DES stent to RCA   Acute systolic congestive heart failure  LVEF 30-35% with severe hypokinesis of the inferolateral, inferior, and inferoseptal myocardium  - has diuresed about 1.5 L - cont PRN Lasix per card recommendations  Acute respiratory failure with hypoxia  Due to above - improved with diuresis   HTN - switch from Metoprolol 25 BID to Toprol 75 daily-   DM  - A1c 7.5 -  add Glipizide to Metformin  HLD  Zocor increased from 20 to 40 daily   Procedures: TTE - results as noted above  Cardiac cath 2/25 - results as noted above Cardiac cath 2/26- results above  Consultations:  cardiology  Discharge Exam: Filed Vitals:   04/07/13 0730  BP: 149/91  Pulse: 75  Temp: 98.4 F (36.9 C)  Resp: 19    General: AAO x 3, no distress Cardiovascular: RRR, no mumurs Respiratory: CTA b/l  Extremities: no edema  Discharge Instructions      Discharge Orders   Future Orders Complete By Expires   Diet - low sodium heart healthy  As directed    Comments:     And diabetic   Increase activity slowly  As directed        Medication List    STOP taking these medications       metoprolol tartrate 25 MG tablet  Commonly known as:  LOPRESSOR      TAKE these medications       aspirin 81 MG EC tablet  Take 1 tablet (81 mg total) by mouth daily.  Start taking on:  04/08/2013     Cinnamon 500 MG capsule  Take 1,000 mg by mouth 2 (two) times daily with a meal.     clopidogrel 75 MG tablet  Commonly known as:  PLAVIX  Take 1 tablet (75 mg total) by mouth daily with breakfast.     furosemide 20 MG tablet  Commonly known as:  LASIX  Take 20 mg by mouth daily  as needed (for leg swelling.). Takes if he has increased edema     glipiZIDE 10 MG tablet  Commonly known as:  GLUCOTROL  Take 0.5-1 tablets (5-10 mg total) by mouth daily before breakfast.     glipiZIDE 10 MG 24 hr tablet  Commonly known as:  GLUCOTROL XL  Take 10 mg by mouth 2 (two) times daily.     metFORMIN 1000 MG tablet  Commonly known as:  GLUCOPHAGE  Take 1,000 mg by mouth 2 (two) times daily with a meal.     metoprolol succinate 50 MG 24 hr tablet  Commonly known as:  TOPROL XL  Take 2 tablets (100 mg total) by mouth daily. Take with or immediately following a meal.     multivitamin with minerals Tabs tablet  Take 1 tablet by mouth daily.     omeprazole 20 MG tablet   Commonly known as:  PRILOSEC OTC  Take 20 mg by mouth daily as needed (heartburn).     quinapril 20 MG tablet  Commonly known as:  ACCUPRIL  Take 20 mg by mouth every morning.     simvastatin 20 MG tablet  Commonly known as:  ZOCOR  Take 2 tablets (40 mg total) by mouth every evening.       No Known Allergies    The results of significant diagnostics from this hospitalization (including imaging, microbiology, ancillary and laboratory) are listed below for reference.    Significant Diagnostic Studies: Dg Chest Portable 1 View  04/04/2013   CLINICAL DATA:  Shortness of breath and chest pain.  EXAM: PORTABLE CHEST - 1 VIEW  COMPARISON:  Chest radiograph performed 04/14/2012  FINDINGS: Bibasilar airspace opacification may reflect multifocal pneumonia or pulmonary edema. Underlying vascular congestion is seen. No definite pleural effusion or pneumothorax seen.  The cardiomediastinal silhouette is borderline normal in size. No acute osseous abnormalities are identified.  IMPRESSION: Bibasilar airspace opacification may reflect multifocal pneumonia or pulmonary edema. Underlying vascular congestion seen.   Electronically Signed   By: Garald Balding M.D.   On: 04/04/2013 05:48    Microbiology: Recent Results (from the past 240 hour(s))  MRSA PCR SCREENING     Status: None   Collection Time    04/04/13 10:06 AM      Result Value Ref Range Status   MRSA by PCR NEGATIVE  NEGATIVE Final   Comment:            The GeneXpert MRSA Assay (FDA     approved for NASAL specimens     only), is one component of a     comprehensive MRSA colonization     surveillance program. It is not     intended to diagnose MRSA     infection nor to guide or     monitor treatment for     MRSA infections.  URINE CULTURE     Status: None   Collection Time    04/05/13  7:21 PM      Result Value Ref Range Status   Specimen Description URINE, CLEAN CATCH   Final   Special Requests Normal   Final   Culture   Setup Time     Final   Value: 04/05/2013 20:02     Performed at [facility]     Final   Value: NO GROWTH     Performed at [facility]   Culture     Final   Value: NO GROWTH  Performed at [facility]   Report Status 04/06/2013 FINAL   Final     Labs: Basic Metabolic Panel:  Recent Labs Lab 04/04/13 0526 04/04/13 1020 04/05/13 0103 04/06/13 0805 04/07/13 0235  NA 139  --  141 141 137  K 4.0  --  4.0 3.9 3.9  CL 97  --  97 102 101  CO2 23  --  27 27 23   GLUCOSE 313*  --  197* 205* 192*  BUN 10  --  16 13 11   CREATININE 0.84  --  0.97 0.90 0.85  CALCIUM 9.2  --  8.7 8.4 8.2*  MG  --  1.7  --   --   --    Liver Function Tests:  Recent Labs Lab 04/04/13 0526  AST 49*  ALT 43  ALKPHOS 129*  BILITOT 0.6  PROT 7.3  ALBUMIN 3.3*   No results found for this basename: LIPASE, AMYLASE,  in the last 168 hours No results found for this basename: AMMONIA,  in the last 168 hours CBC:  Recent Labs Lab 04/04/13 0526 04/05/13 0103 04/06/13 0805  WBC 9.7 9.8 7.3  NEUTROABS 5.9  --   --   HGB 15.2 14.8 14.0  HCT 45.1 42.0 40.4  MCV 94.5 95.2 94.4  PLT 198 179 166   Cardiac Enzymes:  Recent Labs Lab 04/04/13 0744 04/04/13 1020 04/04/13 1550 04/04/13 2210  TROPONINI 0.85* 1.18* 2.25* 1.91*   BNP: BNP (last 3 results)  Recent Labs  04/04/13 0526  PROBNP 272.5*   CBG:  Recent Labs Lab 04/06/13 0738 04/06/13 1208 04/06/13 1549 04/06/13 2122 04/07/13 0741  GLUCAP 211* 166* 134* 228* 173*       SignedDebbe Odea, MD [facility] 04/07/2013, 10:33 AM

## 2013-04-07 NOTE — Progress Notes (Signed)
Patient Name: Devin Hanna Date of Encounter: 04/07/2013  Length of Stay: 3  SUBJECTIVE  No chest pain, SOB has improved. No pain in the right arm.   CURRENT MEDS . [START ON 04/08/2013] aspirin EC  81 mg Oral Daily  . atorvastatin  80 mg Oral q1800  . clopidogrel  75 mg Oral Q breakfast  . enoxaparin (LOVENOX) injection  50 mg Subcutaneous Q24H  . insulin aspart  0-15 Units Subcutaneous TID WC  . insulin aspart  0-5 Units Subcutaneous QHS  . insulin detemir  20 Units Subcutaneous QHS  . metoprolol tartrate  50 mg Oral BID  . multivitamin with minerals  1 tablet Oral Daily  . pantoprazole  40 mg Oral Daily  . quinapril  20 mg Oral q morning - 10a  . sodium chloride  3 mL Intravenous Q12H   OBJECTIVE  Filed Vitals:   04/07/13 0400 04/07/13 0500 04/07/13 0600 04/07/13 0730  BP: 116/54 136/63 135/61 149/91  Pulse: 71 76 37 75  Temp: 98.8 F (37.1 C)   98.4 F (36.9 C)  TempSrc: Oral   Oral  Resp: 19 19 19 19   Height:      Weight:  239 lb 13.8 oz (108.8 kg)    SpO2: 96% 96% 96% 97%    Intake/Output Summary (Last 24 hours) at 04/07/13 1015 Last data filed at 04/07/13 0800  Gross per 24 hour  Intake   1327 ml  Output    651 ml  Net    676 ml   Filed Weights   04/05/13 0507 04/06/13 0500 04/07/13 0500  Weight: 240 lb 4.8 oz (109 kg) 237 lb 14 oz (107.9 kg) 239 lb 13.8 oz (108.8 kg)   PHYSICAL EXAM  General: Pleasant, NAD. Neuro: Alert and oriented X 3. Moves all extremities spontaneously. Psych: Normal affect. HEENT:  Normal  Neck: Supple without bruits or JVD. Lungs:  Resp regular and unlabored, mild crackes B/L. Heart: RRR no s3, s4, or murmurs. Abdomen: Soft, non-tender, non-distended, BS + x 4.  Extremities: No clubbing, cyanosis, trace B/L edema. DP/PT/Radials 2+ and equal bilaterally.  Accessory Clinical Findings  CBC  Recent Labs  04/05/13 0103 04/06/13 0805  WBC 9.8 7.3  HGB 14.8 14.0  HCT 42.0 40.4  MCV 95.2 94.4  PLT 179 893   Basic  Metabolic Panel  Recent Labs  04/04/13 1020  04/06/13 0805 04/07/13 0235  NA  --   < > 141 137  K  --   < > 3.9 3.9  CL  --   < > 102 101  CO2  --   < > 27 23  GLUCOSE  --   < > 205* 192*  BUN  --   < > 13 11  CREATININE  --   < > 0.90 0.85  CALCIUM  --   < > 8.4 8.2*  MG 1.7  --   --   --   < > = values in this interval not displayed. Liver Function Tests No results found for this basename: AST, ALT, ALKPHOS, BILITOT, PROT, ALBUMIN,  in the last 72 hours Cardiac Enzymes  Recent Labs  04/04/13 1020 04/04/13 1550 04/04/13 2210  TROPONINI 1.18* 2.25* 1.91*    Recent Labs  04/04/13 1020  TSH 3.056   Radiology/Studies  Dg Chest Portable 1 View  04/04/2013   CLINICAL DATA:  Shortness of breath and chest pain.  EXAM: PORTABLE CHEST - 1 VIEW  COMPARISON:  Chest radiograph  performed 04/14/2012  FINDINGS: Bibasilar airspace opacification may reflect multifocal pneumonia or pulmonary edema. Underlying vascular congestion is seen. No definite pleural effusion or pneumothorax seen.  The cardiomediastinal silhouette is borderline normal in size. No acute osseous abnormalities are identified.  IMPRESSION: Bibasilar airspace opacification may reflect multifocal pneumonia or pulmonary edema. Underlying vascular congestion seen.   Electronically Signed   By: Garald Balding M.D.   On: 04/04/2013 05:48   TELE: SR, frequent PVCs  ECG: SR, new LBBB  Echo 04/04/2013 Study Conclusions  - Left ventricle: The cavity size was normal. Systolic function was moderately to severely reduced. The estimated ejection fraction was in the range of 30% to 35%. Severe hypokinesis of the inferolateral, inferior, and inferoseptal myocardium. - Ventricular septum: Septal motion showed abnormal function, dyssynergy, and paradox. These changes are consistent with a left bundle branch block. - Left atrium: The atrium was moderately dilated.     ASSESSMENT AND PLAN  Active Problems:   MI (myocardial  infarction)   Acute respiratory failure   Acute respiratory failure with hypoxia   CAD (coronary artery disease)   Pulmonary edema  63 year old male with known CAD, h/o cardiac arrest in 1997, with angioplasty, he hasn't follow in the last 10 years. He presented with SOB and flash pulmonary edema on 04/03/13.  1. NSTEMI - new LBBB yesterday, the patient underwent cardiac catheterization yesterday with findings significant 2 vessel coronary artery disease with 99% instent restenosis in the right coronary artery which is heavily fibrotic and calcified, the disease in the LAD the distal,  moderately elevated left ventricular end-diastolic pressure. A successful PCI/RCA yesterday, plan is to use ASA 81 mg po daily with Plavix for at least 12 months.   2. Acute systolic CHF - LVEF 65-78% with severe hypokinesis of the inferolateral, inferior, and inferoseptal myocardium, euvolemic now, we will continue PRN diuretics at home.  3. DM - on insulin SS  4. Hyperlipidemia - on high dose atorvastatin  He will be discharged today and follow in our clinic.   Signed, Ena Dawley, H MD, [facility] 04/07/2013

## 2013-04-10 NOTE — Plan of Care (Signed)
Hold by pharmacy to clarify discharge medications. Will continue preadmission Glucotrol XL 10 mg twice a day.Clarified metoprolol tartrate 50 mg twice a day since this was dosed patient was taking while hospitalized. Also given new orders for SL NTG to use prn CP.  Erin Hearing, ANP

## 2013-04-10 NOTE — Telephone Encounter (Signed)
TCM pt.  Called pt.  Was discharged Fri 2/27 from heart cath.  States they stuck both his arm and (R) groin.  States both sites healed.  (R) groin still bruised but no redness. States he is feeling good.  No questions regarding medications.  States his daughter who works at a pharmacy was checking on one medication.  States he does not have RX for NTG.  He will check with daughter to see if that is one of the medications she had a questions about.  Will let us know if needs Rx.  Is aware of app on 3/6 with Kathrene Alu. Will call if has any questions or concerns.

## 2013-04-14 ENCOUNTER — Ambulatory Visit (INDEPENDENT_AMBULATORY_CARE_PROVIDER_SITE_OTHER): Payer: BC Managed Care – PPO | Admitting: Nurse Practitioner

## 2013-04-14 ENCOUNTER — Encounter: Payer: Self-pay | Admitting: Nurse Practitioner

## 2013-04-14 VITALS — BP 119/72 | HR 72 | Ht 70.0 in | Wt 239.0 lb

## 2013-04-14 DIAGNOSIS — I214 Non-ST elevation (NSTEMI) myocardial infarction: Secondary | ICD-10-CM

## 2013-04-14 DIAGNOSIS — I519 Heart disease, unspecified: Secondary | ICD-10-CM

## 2013-04-14 DIAGNOSIS — E785 Hyperlipidemia, unspecified: Secondary | ICD-10-CM

## 2013-04-14 DIAGNOSIS — I10 Essential (primary) hypertension: Secondary | ICD-10-CM

## 2013-04-14 LAB — BASIC METABOLIC PANEL
BUN: 12 mg/dL (ref 6–23)
CO2: 22 mEq/L (ref 19–32)
Calcium: 9.6 mg/dL (ref 8.4–10.5)
Chloride: 102 mEq/L (ref 96–112)
Creatinine, Ser: 0.9 mg/dL (ref 0.4–1.5)
GFR: 88.86 mL/min (ref >60.00)
Glucose, Bld: 109 mg/dL — ABNORMAL HIGH (ref 70–99)
Potassium: 4.5 mEq/L (ref 3.5–5.1)
Sodium: 136 mEq/L (ref 135–145)

## 2013-04-14 LAB — CBC
HCT: 41.5 % (ref 39.0–52.0)
Hemoglobin: 14 g/dL (ref 13.0–17.0)
MCHC: 33.8 g/dL (ref 30.0–36.0)
MCV: 95.7 fl (ref 78.0–100.0)
Platelets: 347 10*3/uL (ref 150.0–400.0)
RBC: 4.33 Mil/uL (ref 4.22–5.81)
RDW: 14.4 % (ref 11.5–14.6)
WBC: 9.1 10*3/uL (ref 4.5–10.5)

## 2013-04-14 NOTE — Progress Notes (Signed)
Devin Hanna Date of Birth: 03/05/1950 Medical Record #[id number]  History of Present Illness: [name] is seen back today for a post hospital/TOC visit. Seen for Dr. Fletcher Anon. Saw Dr. Verl Blalock in the remote past. He has known CAD with past stents and prior MI, GERD, DM, HTN and HLD. He has not had regular cardiology follow up.  Most recently presented with sudden onset of SOB - required Lasix and Bipap - ruled in for MI - was cathed and had initial attempt at PCI to the RCA that was unsuccessful and the procedure was aborted. Then went back and had rotational atherectomy, cutting balloon angioplasty and DES to the proximal RCA with temporary pacemaker in place following groin access. EF is 30% by echo. No LV gram at time of cath.   Comes back today. Here with his daughter, Devin Hanna. He is doing quite well. No recurrent chest pain. Breathing is "fine". No swelling. No cough. Weight is stable. Trying to restrict his salt. No bleeding/bruising noted. Tolerating his medicines. 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 No problems with his cath sites.   Current Outpatient Prescriptions  Medication Sig Dispense Refill  . aspirin EC 81 MG EC tablet Take 1 tablet (81 mg total) by mouth daily.  30 tablet  0  . Cinnamon 500 MG capsule Take 1,000 mg by mouth 2 (two) times daily with a meal.      . clopidogrel (PLAVIX) 75 MG tablet Take 1 tablet (75 mg total) by mouth daily with breakfast.  30 tablet  0  . furosemide (LASIX) 20 MG tablet Take 20 mg by mouth daily as needed (for leg swelling.). Takes if he has increased edema      . glipiZIDE (GLUCOTROL XL) 10 MG 24 hr tablet Take 10 mg by mouth 2 (two) times daily.      . metFORMIN (GLUCOPHAGE) 1000 MG tablet Take 1,000 mg by mouth 2 (two) times daily with a meal.      . metoprolol succinate (TOPROL-XL) 50 MG 24 hr tablet Take 50 mg by mouth 2 (two) times daily.      . Multiple Vitamin (MULTIVITAMIN WITH MINERALS) TABS  Take 1 tablet by mouth daily.      Marland Kitchen omeprazole (PRILOSEC OTC) 20 MG tablet Take 20 mg by mouth daily as needed (heartburn).      . quinapril (ACCUPRIL) 20 MG tablet Take 20 mg by mouth every morning.      . simvastatin (ZOCOR) 20 MG tablet Take 2 tablets (40 mg total) by mouth every evening.  60 tablet  0   No current facility-administered medications for this visit.    No Known Allergies  Past Medical History  Diagnosis Date  . MI (myocardial infarction)     "has not seen cardiologist in several years"  . Diabetes mellitus     sees Dr. Hardin Negus in [place]  . Hyperlipidemia   . Vertigo     Past Surgical History  Procedure Laterality Date  . Coronary stent placement    . External fixation leg Left 04/03/2012    Procedure: EXTERNAL FIXATION LEG;  Surgeon: Wylene Simmer, MD;  Location: [place];  Service: Orthopedics;  Laterality: Left;  . Irrigation and debridement knee Left 04/03/2012    Procedure: IRRIGATION AND DEBRIDEMENT KNEE;  Surgeon: Wylene Simmer, MD;  Location: [place];  Service: Orthopedics;  Laterality: Left;  . Orif tibia plateau Left 04/14/2012    Procedure: OPEN  REDUCTION INTERNAL FIXATION (ORIF) TIBIAL PLATEAU;  Surgeon: Wylene Simmer, MD;  Location: [place];  Service: Orthopedics;  Laterality: Left;  . External fixation removal Left 04/14/2012    Procedure: REMOVAL EXTERNAL FIXATION LEG;  Surgeon: Wylene Simmer, MD;  Location: [place];  Service: Orthopedics;  Laterality: Left;    History  Smoking status  . Former Smoker  . Quit date: 04/05/1995  Smokeless tobacco  . Not on file    History  Alcohol Use  . Yes    Comment: Occassional Use    Family History  Problem Relation Age of Onset  . Coronary artery disease Brother   . Cancer Brother   . Coronary artery disease Father     Review of Systems: The review of systems is per the HPI.  All other systems were reviewed and are negative.  Physical Exam: BP 119/72  Pulse 72  Ht 5' 10"  (1.778 m)  Wt 239 lb (108.41 kg)   BMI 34.29 kg/m2 Patient is very pleasant and in no acute distress. Looks older than his stated age. Skin is warm and dry. Color is normal.  HEENT is unremarkable. Normocephalic/atraumatic. PERRL. Sclera are nonicteric. Neck is supple. No masses. No JVD. Lungs are clear. Cardiac exam shows a regular rate and rhythm. Abdomen is soft. Extremities are with just trace edema. Gait and ROM are intact. No gross neurologic deficits noted.  Wt Readings from Last 3 Encounters:  04/14/13 239 lb (108.41 kg)  04/07/13 239 lb 13.8 oz (108.8 kg)  04/07/13 239 lb 13.8 oz (108.8 kg)     LABORATORY DATA: PENDING  Lab Results  Component Value Date   WBC 7.3 04/06/2013   HGB 14.0 04/06/2013   HCT 40.4 04/06/2013   PLT 166 04/06/2013   GLUCOSE 192* 04/07/2013   ALT 43 04/04/2013   AST 49* 04/04/2013   NA 137 04/07/2013   K 3.9 04/07/2013   CL 101 04/07/2013   CREATININE 0.85 04/07/2013   BUN 11 04/07/2013   CO2 23 04/07/2013   TSH 3.056 04/04/2013   INR 1.08 04/04/2013   HGBA1C 7.5* 04/06/2013    Lab Results  Component Value Date   CKTOTAL 144 04/12/2011   CKMB 2.8 04/12/2011   TROPONINI 1.91* 04/04/2013   Coronary angiography:  Coronary dominance: Right  Left Main: Normal  Left Anterior Descending (LAD): Normal in size with moderate to heavy calcifications. There is diffuse 40% disease in the midsegment. There is a discrete 60-70% stenosis distally with another 50% stenosis close to the apex.  1st diagonal (D1): Large in size with minor irregularities.  2nd diagonal (D2): Large in size with no significant disease.  3rd diagonal (D3): Normal in size with minor irregularities.  Circumflex (LCx): Normal in size and nondominant. The vessel is moderately calcified. There is diffuse 20% disease proximally.  1st obtuse marginal: Small in size with minor irregularities.  2nd obtuse marginal: Normal in size with 60% ostial stenosis.  3rd obtuse marginal: Small in size with minor irregularities.  Right Coronary  Artery: Large in size and dominant. The vessel is heavily calcified. The stent is noted in the proximal and midsegment with severe 99% instent restenosis.  Posterior descending artery: Large in size with minor irregularities.  Posterior AV segment: Normal in size with minor irregularities.  Posterolateral branchs: PL 1 is normal in size with minor irregularities. PL 2 is large in size with minor irregularities. Left ventriculography: Was not performed as the patient had an echocardiogram done.  PCI Note: Following  the diagnostic procedure, the decision was made to proceed with PCI. Weight-based bivalirudin was given for anticoagulation. Once a therapeutic ACT was achieved, a 6 [place] AL-1 guide catheter was inserted. A JR 4 and AR-1 guiding catheter did not provide optimal support. Also the AL-1 was difficult to engage via the radial approach. A run through coronary guidewire was used to cross the lesion. The lesion was predilated with a 2.5 x 20 mm balloon. There was significant difficulty in advancing the balloon across the stenosis. The lesion was not dilated with this. Thus, I used a 2.5 x 20 mm noncompliant balloon to 22 atmospheres but this did not dilate the lesion. A 2.75 balloon did not cross the lesion. Also an a 2 mm angioscore balloon did not cross the lesion in spite of using a second wire as a buddy wire. It was clear that the lesion is extremely calcified and fibrotic with inability to dilate. Thus, I decided to abort the procedure. More guiding support is needed from the femoral artery and probably with atherectomy. There were no immediate procedural complications. A TR band was used for radial hemostasis. The patient was transferred to the post catheterization recovery area for further monitoring.  PCI Data:  Vessel - RCA/Segment - proximal and mid  Percent Stenosis (pre) 99%  TIMI-flow 3  Stent no stent. Balloon angioplasty only with inability to dilate the lesion.  Percent Stenosis  (post) 95%  TIMI-flow (post) 3  Final Conclusions:  1. Significant 2 vessel coronary artery disease with 99% instent restenosis in the right coronary artery which is heavily fibrotic and calcified. The disease in the LAD the distal.  2. Moderately elevated left ventricular end-diastolic pressure.  3. Unsuccessful angioplasty of the right coronary artery due to inability to dilate the lesion.  Recommendations:  resume heparin 6 hours after the procedure. I will discuss with my colleagues and will likely proceed with attempted angioplasty via the femoral approach for more guiding catheter support with atherectomy. I suspect that the lesion in the RCA is chronic.  [name] MD, [facility]  04/05/2013, 2:27 PM    Procedure: Prophylactic Transvenous temporary pacemaker placement, Rotational atherectomy, cutting balloon angioplasty and drug-eluting stent placement to the proximal right coronary artery  Indication: Non-ST elevation myocardial infarction with inability to dilate the lesion with balloon angioplasty.  Medications:  Sedation: 2 mg IV Versed, 50 mcg IV Fentanyl  Contrast: 145 ML Omnipaque  Procedural Details: The right groin was prepped, draped, and anesthetized with 1% lidocaine. Using the modified Seldinger technique, a 6 Fr sheath was introduced into the right femoral vein. And a 7 French sheath was placed in the right femoral artery. The patient was given 7000 of unfractionated heparin. In addition of 2000 of unfractionated heparin was given to maintain an ACT above 250. A temporary transvenous pacemaker was placed via the right femoral vein. Once a therapeutic ACT was achieved, a [place] AL 0.75 guide catheter was inserted. A long run through coronary guidewire was used to cross the lesion with an over-the-wire balloon. The wire was exchanged into the Rotafloppy wire. Rotational atherectomy was performed with a 1.5 mm bur with improved results. I then used a 3 x 10 mm cutting balloon to  dilate the lesion but was not able to fully dilate the lesion even after atherectomy. Thus, I decided to use a 2 mm bur with good results and 50% residual stenosis. At this point, I used a 3.5 x 10 mm cutting balloon with multiple inflations performed  to high pressure. The lesion was then stented with a 3.0 x 38 mm Promus drug-eluting stent which was deployed to 18 atmospheres. I then used a 3.5 x 15 mm noncompliant balloon to post dilate the lesion to high pressure (14 atmosphere at the edges and 18 atmospheres in the midsegment). Following PCI, there was 0% residual stenosis and TIMI-3 flow. Final angiography confirmed an excellent result. The patient tolerated the procedure well. There were no immediate procedural complications. Femoral hemostasis was achieved with manual pressure. The patient was transferred to the post catheterization recovery area for further monitoring.  PCI Data:  Vessel - RCA/Segment - proximal and mid  Percent Stenosis (pre) 99%  TIMI-flow 3  Stent 3.0 x 38 mm Promus drug-eluting stent postdilated with a 3.5 noncompliant balloon  Percent Stenosis (post) 0%  TIMI-flow (post) 3  Final Conclusions:  Successful rotational atherectomy, cutting balloon angioplasty and drug-eluting stent placement to the proximal right coronary artery extending into the midsegment.  Recommendations:  Continue dual antiplatelet therapy for at least 12 months. Optimize heart failure management. I increased the dose of metoprolol. This can be switched to extended release metoprolol upon discharge. I switched IV furosemide to by mouth.  This was a difficult and prolonged procedure.  [name] MD, [facility]  04/06/2013, 3:32 PM  Echo Study Conclusions  - Left ventricle: The cavity size was normal. Systolic function was moderately to severely reduced. The estimated ejection fraction was in the range of 30% to 35%. Severe hypokinesis of the inferolateral, inferior, and inferoseptal myocardium. -  Ventricular septum: Septal motion showed abnormal function, dyssynergy, and paradox. These changes are consistent with a left bundle branch block. - Left atrium: The atrium was moderately dilated.   Assessment / Plan:  1. NSTEMI with PCI to the proximal RCA - long and difficult procedure - on DAPT for at least one year - probably indefinite - he is doing well clinically. Will recheck baseline labs today. See back in a month. Not interested in cardiac rehab. No change with his medicines.   2. LV dysfunction - totally asymptomatic - would plan on repeat echo 3 months post MI - reminded to restrict salt.   3. HTN - BP looks good  4. DM  5. HLD - on statin  See him back in a month. Have allowed him to go back to work starting next week - half days for 2 weeks and then his usual hours. See him back with fasting labs.   Patient is agreeable to this plan and will call if any problems develop in the interim.   Burtis Junes, RN, [facility] [address] [place], [place]  [id number] [phone]

## 2013-04-14 NOTE — Patient Instructions (Signed)
I think you are doing ok  May return to work next week - half days for 2 weeks and then back to your usual hours  We will check lab today  See Dr. Fletcher Anon in [place] in a month - come fasting that day for labs  We will need to have a repeat ultrasound of your heart after May 24th - will be scheduling at a later date  Call the [facility] office at [phone] if you have any questions, problems or concerns.

## 2013-05-15 ENCOUNTER — Encounter: Payer: Self-pay | Admitting: Cardiovascular Disease

## 2013-05-15 ENCOUNTER — Ambulatory Visit (INDEPENDENT_AMBULATORY_CARE_PROVIDER_SITE_OTHER): Payer: BC Managed Care – PPO | Admitting: Cardiovascular Disease

## 2013-05-15 ENCOUNTER — Other Ambulatory Visit: Payer: BC Managed Care – PPO

## 2013-05-15 VITALS — BP 156/94 | HR 64 | Ht 69.0 in | Wt 238.5 lb

## 2013-05-15 DIAGNOSIS — I10 Essential (primary) hypertension: Secondary | ICD-10-CM

## 2013-05-15 DIAGNOSIS — I5022 Chronic systolic (congestive) heart failure: Secondary | ICD-10-CM

## 2013-05-15 DIAGNOSIS — I2510 Atherosclerotic heart disease of native coronary artery without angina pectoris: Secondary | ICD-10-CM

## 2013-05-15 DIAGNOSIS — E785 Hyperlipidemia, unspecified: Secondary | ICD-10-CM

## 2013-05-15 MED ORDER — ATORVASTATIN CALCIUM 40 MG PO TABS: 40.0000 mg | ORAL_TABLET | Freq: Every day | ORAL | Status: DC

## 2013-05-15 MED ORDER — CLOPIDOGREL BISULFATE 75 MG PO TABS: 75.0000 mg | ORAL_TABLET | Freq: Every day | ORAL | Status: DC

## 2013-05-15 NOTE — Patient Instructions (Signed)
Your physician has recommended you make the following change in your medication:  1. Stop Simvastatin 2. Start Atorvastatin 40 mg daily      Your physician recommends that you return for a FASTING lipid profile, liver in 6 weeks  Your physician recommends that you schedule a follow-up appointment in: 3 months with DR. Fletcher Anon

## 2013-05-15 NOTE — Assessment & Plan Note (Signed)
He is doing very well with no symptoms suggestive of angina. Continue medical therapy. He is to stay on dual antiplatelet therapy long-term at least for one year.

## 2013-05-15 NOTE — Assessment & Plan Note (Signed)
He is currently New York Heart Association class II. Ejection fraction was 30-35%. The plan is to repeat echocardiogram in few months.

## 2013-05-15 NOTE — Assessment & Plan Note (Signed)
I switched simvastatin to atorvastatin 40 mg once daily. Check fasting lipid and liver profile in 6 weeks.

## 2013-05-15 NOTE — Progress Notes (Signed)
Primary care physician: Dr. Laurian Brim  HPI  Devin Hanna is a 63 year old man who is here for a followup visit regarding coronary artery disease and cardiomyopathy. He saw Dr. Verl Blalock in the remote past. He has known CAD with past RCA stents and prior MI, GERD, DM, HTN and HLD.  He presented in February of this year with sudden onset of SOB - required Lasix and Bipap - ruled in for MI -cardiac catheterization showed 99% in-stent restenosis in the right coronary artery which was heavily calcified. There was also 60-70% distal LAD stenosis. EF was 30-35% by echo. He did have left bundle branch block. RCA PCI was attempted and was initially unsuccessful due to inability to dilate the calcified lesion. I performed successful  rotational atherectomy, cutting balloon angioplasty and DES to the proximal RCA .  He has been doing very well since then and denies any chest pain, dyspnea, orthopnea or PND. He uses Lasix only as needed and an average of 4 times a week. He is back to work.    No Known Allergies   Current Outpatient Prescriptions on File Prior to Visit  Medication Sig Dispense Refill  . aspirin EC 81 MG EC tablet Take 1 tablet (81 mg total) by mouth daily.  30 tablet  0  . Cinnamon 500 MG capsule Take 1,000 mg by mouth 2 (two) times daily with a meal.      . clopidogrel (PLAVIX) 75 MG tablet Take 1 tablet (75 mg total) by mouth daily with breakfast.  30 tablet  0  . furosemide (LASIX) 20 MG tablet Take 20 mg by mouth daily as needed (for leg swelling.). Takes if he has increased edema      . glipiZIDE (GLUCOTROL XL) 10 MG 24 hr tablet Take 10 mg by mouth 2 (two) times daily.      . metFORMIN (GLUCOPHAGE) 1000 MG tablet Take 1,000 mg by mouth 2 (two) times daily with a meal.      . metoprolol succinate (TOPROL-XL) 50 MG 24 hr tablet Take 50 mg by mouth 2 (two) times daily.      . Multiple Vitamin (MULTIVITAMIN WITH MINERALS) TABS Take 1 tablet by mouth daily.      . nitroGLYCERIN  (NITROSTAT) 0.4 MG SL tablet Place 0.4 mg under the tongue every 5 (five) minutes as needed for chest pain.      Marland Kitchen omeprazole (PRILOSEC OTC) 20 MG tablet Take 20 mg by mouth daily as needed (heartburn).      . quinapril (ACCUPRIL) 20 MG tablet Take 20 mg by mouth every morning.      . simvastatin (ZOCOR) 20 MG tablet Take 2 tablets (40 mg total) by mouth every evening.  60 tablet  0   No current facility-administered medications on file prior to visit.     Past Medical History  Diagnosis Date  . MI (myocardial infarction)     "has not seen cardiologist in several years"  . Diabetes mellitus     sees Dr. Hardin Negus in [place]  . Hyperlipidemia   . Vertigo      Past Surgical History  Procedure Laterality Date  . Coronary stent placement    . External fixation leg Left 04/03/2012    Procedure: EXTERNAL FIXATION LEG;  Surgeon: Wylene Simmer, MD;  Location: [facility];  Service: Orthopedics;  Laterality: Left;  . Irrigation and debridement knee Left 04/03/2012    Procedure: IRRIGATION AND DEBRIDEMENT KNEE;  Surgeon: Wylene Simmer, MD;  Location: [facility];  Service: Orthopedics;  Laterality: Left;  . Orif tibia plateau Left 04/14/2012    Procedure: OPEN REDUCTION INTERNAL FIXATION (ORIF) TIBIAL PLATEAU;  Surgeon: Wylene Simmer, MD;  Location: [facility];  Service: Orthopedics;  Laterality: Left;  . External fixation removal Left 04/14/2012    Procedure: REMOVAL EXTERNAL FIXATION LEG;  Surgeon: Wylene Simmer, MD;  Location: [facility];  Service: Orthopedics;  Laterality: Left;     Family History  Problem Relation Age of Onset  . Coronary artery disease Brother   . Cancer Brother   . Coronary artery disease Father      History   Social History  . Marital Status: Single    Spouse Name: N/A    Number of Children: N/A  . Years of Education: N/A   Occupational History  . heavy Company secretary    Social History Main Topics  . Smoking status: Former Smoker    Quit date: 04/05/1995  . Smokeless  tobacco: Not on file  . Alcohol Use: No     Comment: Occassional Use  . Drug Use: No  . Sexual Activity: Not on file   Other Topics Concern  . Not on file   Social History Narrative   Heavy Company secretary.       Divorced      PHYSICAL EXAM   BP 156/94  Pulse 64  Ht 5' 9"  (1.753 m)  Wt 238 lb 8 oz (108.183 kg)  BMI 35.20 kg/m2 Constitutional: He is oriented to person, place, and time. He appears well-developed and well-nourished. No distress.  HENT: No nasal discharge.  Head: Normocephalic and atraumatic.  Eyes: Pupils are equal and round.  No discharge. Neck: Normal range of motion. Neck supple. No JVD present. No thyromegaly present.  Cardiovascular: Normal rate, regular rhythm, normal heart sounds. Exam reveals no gallop and no friction rub. No murmur heard.  Pulmonary/Chest: Effort normal and breath sounds normal. No stridor. No respiratory distress. He has no wheezes. He has no rales. He exhibits no tenderness.  Abdominal: Soft. Bowel sounds are normal. He exhibits no distension. There is no tenderness. There is no rebound and no guarding.  Musculoskeletal: Normal range of motion. He exhibits no edema and no tenderness.  Neurological: He is alert and oriented to person, place, and time. Coordination normal.  Skin: Skin is warm and dry. No rash noted. He is not diaphoretic. No erythema. No pallor.  Psychiatric: He has a normal mood and affect. His behavior is normal. Judgment and thought content normal.       ASSESSMENT AND PLAN

## 2013-05-15 NOTE — Assessment & Plan Note (Signed)
Blood pressure is elevated today but this is unusual for him. Continue to monitor.

## 2013-05-25 NOTE — Discharge Summary (Signed)
Physician Discharge Summary  Devin Hanna OEV:[id number] DOB: 1951/02/02 DOA: 04/04/2013  PCP: Ronald Lobo, MD  Admit date: 04/04/2013 Discharge date: 04/07/13  Time spent: >45 minutes    Discharge Diagnoses:  Principal Problem:   MI (myocardial infarction) Active Problems:   Acute respiratory failure   Acute respiratory failure with hypoxia   CAD (coronary artery disease)   Pulmonary edema   DM type 2 (diabetes mellitus, type 2)   Other and unspecified hyperlipidemia   Morbid obesity   HTN (hypertension)   Discharge Condition: stable  Diet recommendation: heart healthy, diabetic  Filed Weights   04/05/13 0507 04/06/13 0500 04/07/13 0500  Weight: 109 kg (240 lb 4.8 oz) 107.9 kg (237 lb 14 oz) 108.8 kg (239 lb 13.8 oz)    History of present illness:  63 y.o. male w/ history of CAD status post stents, history of MI, DM, and hypertension who presented w/ c/o sudden shortness of breath that awoke him from sleep. Patient denied any fevers or coughing.  While in the ED the patient had portable chest x-ray which reported bibasilar airspace opacification that may reflect multifocal pneumonia or pulmonary edema. Patient's condition improved after Lasix and BiPAP.   Hospital Course:  STEMI (new LBBB) w/ History of CAD status post stents  Cardiac cath revealed significant 2 vessel coronary artery disease with 99% instent restenosis in the right coronary artery (heavily fibrotic and calcified) with a 60-70% distal LAD stenosis - - repeat Cath with atherectomy, cutting balloon angioplasty and DES stent to RCA   Acute systolic congestive heart failure  LVEF 30-35% with severe hypokinesis of the inferolateral, inferior, and inferoseptal myocardium  - has diuresed about 1.5 L - cont PRN Lasix per card recommendations  Acute respiratory failure with hypoxia  Due to above - improved with diuresis   HTN - switch from Metoprolol 25 BID to Toprol 75 daily-   DM  - A1c 7.5 -  add Glipizide to Metformin  HLD  Zocor increased from 20 to 40 daily   Procedures: TTE - results as noted above  Cardiac cath 2/25 - results as noted above Cardiac cath 2/26- results above  Consultations:  cardiology  Discharge Exam: Filed Vitals:   04/07/13 1139  BP: 120/75  Pulse: 70  Temp: 98.4 F (36.9 C)  Resp: 13    General: AAO x 3, no distress Cardiovascular: RRR, no mumurs Respiratory: CTA b/l  Extremities: no edema  Discharge Instructions      Discharge Orders   Future Appointments Provider Department Dept Phone   06/26/2013 8:30 AM [facility] Nurse [place] [phone]   Future Orders Complete By Expires   Diet - low sodium heart healthy  As directed    Increase activity slowly  As directed        Medication List    STOP taking these medications       metoprolol tartrate 25 MG tablet  Commonly known as:  LOPRESSOR     simvastatin 20 MG tablet  Commonly known as:  ZOCOR      TAKE these medications       aspirin 81 MG EC tablet  Take 1 tablet (81 mg total) by mouth daily.     Cinnamon 500 MG capsule  Take 1,000 mg by mouth 2 (two) times daily with a meal.     furosemide 20 MG tablet  Commonly known as:  LASIX  Take 20 mg by mouth daily as needed (for leg swelling.). Takes if he  has increased edema     glipiZIDE 10 MG 24 hr tablet  Commonly known as:  GLUCOTROL XL  Take 10 mg by mouth 2 (two) times daily.     metFORMIN 1000 MG tablet  Commonly known as:  GLUCOPHAGE  Take 1,000 mg by mouth 2 (two) times daily with a meal.     multivitamin with minerals Tabs tablet  Take 1 tablet by mouth daily.     omeprazole 20 MG tablet  Commonly known as:  PRILOSEC OTC  Take 20 mg by mouth daily as needed (heartburn).     quinapril 20 MG tablet  Commonly known as:  ACCUPRIL  Take 20 mg by mouth every morning.       No Known Allergies Follow-up Information   Follow up with Truitt Merle, NP. ([facility] - 04/14/13 at  11:30am)    Specialty:  Nurse Practitioner   Contact information:   [place] [place] [address] [phone]        The results of significant diagnostics from this hospitalization (including imaging, microbiology, ancillary and laboratory) are listed below for reference.    Significant Diagnostic Studies: Dg Chest Portable 1 View  04/04/2013   CLINICAL DATA:  Shortness of breath and chest pain.  EXAM: PORTABLE CHEST - 1 VIEW  COMPARISON:  Chest radiograph performed 04/14/2012  FINDINGS: Bibasilar airspace opacification may reflect multifocal pneumonia or pulmonary edema. Underlying vascular congestion is seen. No definite pleural effusion or pneumothorax seen.  The cardiomediastinal silhouette is borderline normal in size. No acute osseous abnormalities are identified.  IMPRESSION: Bibasilar airspace opacification may reflect multifocal pneumonia or pulmonary edema. Underlying vascular congestion seen.   Electronically Signed   By: Garald Balding M.D.   On: 04/04/2013 05:48    Microbiology: No results found for this or any previous visit (from the past 240 hour(s)).   Labs: Basic Metabolic Panel: No results found for this basename: NA, K, CL, CO2, GLUCOSE, BUN, CREATININE, CALCIUM, MG, PHOS,  in the last 168 hours Liver Function Tests: No results found for this basename: AST, ALT, ALKPHOS, BILITOT, PROT, ALBUMIN,  in the last 168 hours No results found for this basename: LIPASE, AMYLASE,  in the last 168 hours No results found for this basename: AMMONIA,  in the last 168 hours CBC: No results found for this basename: WBC, NEUTROABS, HGB, HCT, MCV, PLT,  in the last 168 hours Cardiac Enzymes: No results found for this basename: CKTOTAL, CKMB, CKMBINDEX, TROPONINI,  in the last 168 hours BNP: BNP (last 3 results)  Recent Labs  04/04/13 0526  PROBNP 272.5*   CBG: No results found for this basename: GLUCAP,  in the last 168 hours     Signed:  Debbe Odea, MD [facility] 04/07/13, 11:34 AM

## 2013-06-21 ENCOUNTER — Encounter: Payer: Self-pay | Admitting: Pulmonary Disease

## 2013-06-26 ENCOUNTER — Ambulatory Visit (INDEPENDENT_AMBULATORY_CARE_PROVIDER_SITE_OTHER): Payer: BC Managed Care – PPO | Admitting: *Deleted

## 2013-06-26 DIAGNOSIS — I2510 Atherosclerotic heart disease of native coronary artery without angina pectoris: Secondary | ICD-10-CM

## 2013-06-27 LAB — LIPID PANEL
CHOL/HDL RATIO: 3 ratio (ref 0.0–5.0)
Cholesterol, Total: 105 mg/dL (ref 100–199)
HDL: 35 mg/dL — ABNORMAL LOW (ref >39)
LDL Calculated: 41 mg/dL (ref 0–99)
TRIGLYCERIDES: 144 mg/dL (ref 0–149)
VLDL Cholesterol Cal: 29 mg/dL (ref 5–40)

## 2013-06-27 LAB — HEPATIC FUNCTION PANEL: BILIRUBIN DIRECT: 0.21 mg/dL (ref 0.00–0.40)

## 2013-09-07 ENCOUNTER — Encounter: Payer: Self-pay | Admitting: Cardiovascular Disease

## 2013-09-07 ENCOUNTER — Ambulatory Visit (INDEPENDENT_AMBULATORY_CARE_PROVIDER_SITE_OTHER): Payer: BC Managed Care – PPO | Admitting: Cardiovascular Disease

## 2013-09-07 VITALS — BP 159/83 | HR 64 | Ht 67.0 in | Wt 235.5 lb

## 2013-09-07 DIAGNOSIS — I429 Cardiomyopathy, unspecified: Secondary | ICD-10-CM

## 2013-09-07 DIAGNOSIS — I2510 Atherosclerotic heart disease of native coronary artery without angina pectoris: Secondary | ICD-10-CM

## 2013-09-07 DIAGNOSIS — E785 Hyperlipidemia, unspecified: Secondary | ICD-10-CM

## 2013-09-07 DIAGNOSIS — N528 Other male erectile dysfunction: Secondary | ICD-10-CM

## 2013-09-07 DIAGNOSIS — I25119 Atherosclerotic heart disease of native coronary artery with unspecified angina pectoris: Secondary | ICD-10-CM

## 2013-09-07 DIAGNOSIS — I209 Angina pectoris, unspecified: Secondary | ICD-10-CM

## 2013-09-07 DIAGNOSIS — I428 Other cardiomyopathies: Secondary | ICD-10-CM

## 2013-09-07 DIAGNOSIS — N529 Male erectile dysfunction, unspecified: Secondary | ICD-10-CM

## 2013-09-07 DIAGNOSIS — I10 Essential (primary) hypertension: Secondary | ICD-10-CM

## 2013-09-07 DIAGNOSIS — I5022 Chronic systolic (congestive) heart failure: Secondary | ICD-10-CM

## 2013-09-07 DIAGNOSIS — R0602 Shortness of breath: Secondary | ICD-10-CM

## 2013-09-07 MED ORDER — CARVEDILOL 12.5 MG PO TABS
12.5000 mg | ORAL_TABLET | Freq: Two times a day (BID) | ORAL | Status: DC
Start: 2013-09-07 — End: 2014-06-12

## 2013-09-07 NOTE — Assessment & Plan Note (Signed)
Blood pressure continues to be elevated. Thus, I switched metoprolol to carvedilol especially that he is diabetic.

## 2013-09-07 NOTE — Assessment & Plan Note (Signed)
He is currently New York Heart Association class II. Ejection fraction was 30-35%. He continues to have exertional dyspnea. I requested an echocardiogram. If ejection fraction is less than 35%, I would consider an ICD placement.

## 2013-09-07 NOTE — Assessment & Plan Note (Signed)
He is doing well with no symptoms suggestive of angina. Continue medical therapy.

## 2013-09-07 NOTE — Patient Instructions (Signed)
Your physician has requested that you have an echocardiogram. Echocardiography is a painless test that uses sound waves to create images of your heart. It provides your doctor with information about the size and shape of your heart and how well your heart's chambers and valves are working. This procedure takes approximately one hour. There are no restrictions for this procedure.   Your physician has recommended you make the following change in your medication:  Stop Metoprolol  Start Coreg 12.5 mg twice daily    Your physician recommends that you schedule a follow-up appointment in:  3 months with Dr. Fletcher Anon

## 2013-09-07 NOTE — Progress Notes (Signed)
Primary care physician: Dr. Laurian Brim  HPI  Mr. Nelles is a 63 year old man who is here for a followup visit regarding coronary artery disease and cardiomyopathy. He saw Dr. Verl Blalock in the remote past. He has known CAD with past RCA stents and prior MI, GERD, DM, HTN and HLD.  He presented in February of this year with sudden onset of SOB - required Lasix and Bipap - ruled in for MI -cardiac catheterization showed 99% in-stent restenosis in the right coronary artery which was heavily calcified. There was also 60-70% distal LAD stenosis. EF was 30-35% by echo. He did have left bundle branch block. RCA PCI was attempted and was initially unsuccessful due to inability to dilate the calcified lesion. I performed successful  rotational atherectomy, cutting balloon angioplasty and DES to the proximal RCA .  He has been doing very well since then and denies any chest pain,orthopnea or PND. He uses Lasix only as needed .  He continues to have mild exertional dyspnea. He complains of significant erectile dysfunction even before his cardiac event. Blood pressure continues to be elevated.     No Known Allergies   Current Outpatient Prescriptions on File Prior to Visit  Medication Sig Dispense Refill  . aspirin EC 81 MG EC tablet Take 1 tablet (81 mg total) by mouth daily.  30 tablet  0  . atorvastatin (LIPITOR) 40 MG tablet Take 1 tablet (40 mg total) by mouth daily.  90 tablet  3  . Cinnamon 500 MG capsule Take 1,000 mg by mouth 2 (two) times daily with a meal.      . clopidogrel (PLAVIX) 75 MG tablet Take 1 tablet (75 mg total) by mouth daily with breakfast.  30 tablet  11  . glipiZIDE (GLUCOTROL XL) 10 MG 24 hr tablet Take 10 mg by mouth daily with breakfast.       . metFORMIN (GLUCOPHAGE) 1000 MG tablet Take 1,000 mg by mouth 2 (two) times daily with a meal.      . metoprolol succinate (TOPROL-XL) 50 MG 24 hr tablet Take 50 mg by mouth 2 (two) times daily.      . Multiple Vitamin (MULTIVITAMIN  WITH MINERALS) TABS Take 1 tablet by mouth daily.      . nitroGLYCERIN (NITROSTAT) 0.4 MG SL tablet Place 0.4 mg under the tongue every 5 (five) minutes as needed for chest pain.      Marland Kitchen omeprazole (PRILOSEC OTC) 20 MG tablet Take 20 mg by mouth daily as needed (heartburn).      . quinapril (ACCUPRIL) 20 MG tablet Take 20 mg by mouth every morning.       No current facility-administered medications on file prior to visit.     Past Medical History  Diagnosis Date  . MI (myocardial infarction)     "has not seen cardiologist in several years"  . Diabetes mellitus     sees Dr. Hardin Negus in [place]  . Hyperlipidemia   . Vertigo      Past Surgical History  Procedure Laterality Date  . Coronary stent placement    . External fixation leg Left 04/03/2012    Procedure: EXTERNAL FIXATION LEG;  Surgeon: Wylene Simmer, MD;  Location: [facility];  Service: Orthopedics;  Laterality: Left;  . Irrigation and debridement knee Left 04/03/2012    Procedure: IRRIGATION AND DEBRIDEMENT KNEE;  Surgeon: Wylene Simmer, MD;  Location: [facility];  Service: Orthopedics;  Laterality: Left;  . Orif tibia plateau Left 04/14/2012    Procedure:  OPEN REDUCTION INTERNAL FIXATION (ORIF) TIBIAL PLATEAU;  Surgeon: Wylene Simmer, MD;  Location: [facility];  Service: Orthopedics;  Laterality: Left;  . External fixation removal Left 04/14/2012    Procedure: REMOVAL EXTERNAL FIXATION LEG;  Surgeon: Wylene Simmer, MD;  Location: [facility];  Service: Orthopedics;  Laterality: Left;     Family History  Problem Relation Age of Onset  . Coronary artery disease Brother   . Cancer Brother   . Coronary artery disease Father      History   Social History  . Marital Status: Single    Spouse Name: N/A    Number of Children: N/A  . Years of Education: N/A   Occupational History  . heavy Company secretary    Social History Main Topics  . Smoking status: Former Smoker    Quit date: 04/05/1995  . Smokeless tobacco: Not on file  . Alcohol Use:  No     Comment: Occassional Use  . Drug Use: No  . Sexual Activity: Not on file   Other Topics Concern  . Not on file   Social History Narrative   Heavy Company secretary.       Divorced      PHYSICAL EXAM   BP 159/83  Pulse 64  Ht 5' 7"  (1.702 m)  Wt 235 lb 8 oz (106.822 kg)  BMI 36.88 kg/m2 Constitutional: He is oriented to person, place, and time. He appears well-developed and well-nourished. No distress.  HENT: No nasal discharge.  Head: Normocephalic and atraumatic.  Eyes: Pupils are equal and round.  No discharge. Neck: Normal range of motion. Neck supple. No JVD present. No thyromegaly present.  Cardiovascular: Normal rate, regular rhythm, normal heart sounds. Exam reveals no gallop and no friction rub. No murmur heard.  Pulmonary/Chest: Effort normal and breath sounds normal. No stridor. No respiratory distress. He has no wheezes. He has no rales. He exhibits no tenderness.  Abdominal: Soft. Bowel sounds are normal. He exhibits no distension. There is no tenderness. There is no rebound and no guarding.  Musculoskeletal: Normal range of motion. He exhibits no edema and no tenderness.  Neurological: He is alert and oriented to person, place, and time. Coordination normal.  Skin: Skin is warm and dry. No rash noted. He is not diaphoretic. No erythema. No pallor.  Psychiatric: He has a normal mood and affect. His behavior is normal. Judgment and thought content normal.       ASSESSMENT AND PLAN

## 2013-09-07 NOTE — Assessment & Plan Note (Signed)
Lab Results  Component Value Date   HDL 35* 06/26/2013   LDLCALC 41 06/26/2013   TRIG 144 06/26/2013   CHOLHDL 3.0 06/26/2013   Continue treatment with atorvastatin.

## 2013-09-07 NOTE — Assessment & Plan Note (Signed)
He might have less side effects with carvedilol than metoprolol. Reevaluate symptoms in few months. Can consider pharmacologic treatment if cardiac function is stable.

## 2013-10-05 ENCOUNTER — Other Ambulatory Visit (INDEPENDENT_AMBULATORY_CARE_PROVIDER_SITE_OTHER): Payer: BC Managed Care – PPO

## 2013-10-05 ENCOUNTER — Other Ambulatory Visit: Payer: Self-pay

## 2013-10-05 DIAGNOSIS — I429 Cardiomyopathy, unspecified: Secondary | ICD-10-CM

## 2013-10-05 DIAGNOSIS — R0602 Shortness of breath: Secondary | ICD-10-CM

## 2013-10-05 DIAGNOSIS — I2510 Atherosclerotic heart disease of native coronary artery without angina pectoris: Secondary | ICD-10-CM

## 2013-12-08 ENCOUNTER — Ambulatory Visit (INDEPENDENT_AMBULATORY_CARE_PROVIDER_SITE_OTHER): Payer: BC Managed Care – PPO | Admitting: Cardiovascular Disease

## 2013-12-08 ENCOUNTER — Encounter: Payer: Self-pay | Admitting: Cardiovascular Disease

## 2013-12-08 VITALS — BP 152/84 | HR 66 | Ht 68.0 in | Wt 244.5 lb

## 2013-12-08 DIAGNOSIS — I5022 Chronic systolic (congestive) heart failure: Secondary | ICD-10-CM

## 2013-12-08 DIAGNOSIS — I10 Essential (primary) hypertension: Secondary | ICD-10-CM

## 2013-12-08 DIAGNOSIS — I2510 Atherosclerotic heart disease of native coronary artery without angina pectoris: Secondary | ICD-10-CM

## 2013-12-08 DIAGNOSIS — E785 Hyperlipidemia, unspecified: Secondary | ICD-10-CM

## 2013-12-08 MED ORDER — QUINAPRIL HCL 40 MG PO TABS: 40.0000 mg | ORAL_TABLET | Freq: Every day | ORAL | Status: DC

## 2013-12-08 NOTE — Assessment & Plan Note (Signed)
Blood pressure improved but continues to be elevated. I increased quinapril to 40 mg once daily.

## 2013-12-08 NOTE — Assessment & Plan Note (Signed)
Lab Results  Component Value Date   HDL 35* 06/26/2013   LDLCALC 41 06/26/2013   TRIG 144 06/26/2013   CHOLHDL 3.0 06/26/2013   Continue treatment with atorvastatin.

## 2013-12-08 NOTE — Patient Instructions (Addendum)
Your physician has recommended you make the following change in your medication:  Increase Quinapril to 40 mg once daily   Your physician wants you to follow-up in: 6 months. You will receive a reminder letter in the mail two months in advance. If you don't receive a letter, please call our office to schedule the follow-up appointment.  Your next appointment will be scheduled in our new office located at :  [place]  [address] [place]  [place], [place] [address]

## 2013-12-08 NOTE — Progress Notes (Signed)
Primary care physician: Dr. Laurian Brim  HPI  Devin Hanna is a 63 year old man who is here for a followup visit regarding coronary artery disease and cardiomyopathy. He saw Dr. Verl Blalock in the remote past. He has known CAD with past RCA stents and prior MI, GERD, DM, HTN and HLD.  He presented in February of this year with sudden onset of SOB - required Lasix and Bipap - ruled in for MI -cardiac catheterization showed 99% in-stent restenosis in the right coronary artery which was heavily calcified. There was also 60-70% distal LAD stenosis. EF was 30-35% by echo. He did have left bundle branch block. RCA PCI was attempted and was initially unsuccessful due to inability to dilate the calcified lesion. I performed successful  rotational atherectomy, cutting balloon angioplasty and DES to the proximal RCA .  He has been doing very well since then and denies any chest pain,orthopnea or PND. He uses Lasix only as needed .  He continues to have mild exertional dyspnea. He complains of significant erectile dysfunction even before his cardiac event. Blood pressure continues to be elevated. I switched him from metoprolol to carvedilol during last visit. Echocardiogram in 09/2013 showed EF of 45-50% with mild MR.  He is doing well overall with no chest pain or significant dyspnea.   No Known Allergies   Current Outpatient Prescriptions on File Prior to Visit  Medication Sig Dispense Refill  . aspirin EC 81 MG EC tablet Take 1 tablet (81 mg total) by mouth daily.  30 tablet  0  . atorvastatin (LIPITOR) 40 MG tablet Take 1 tablet (40 mg total) by mouth daily.  90 tablet  3  . carvedilol (COREG) 12.5 MG tablet Take 1 tablet (12.5 mg total) by mouth 2 (two) times daily.  180 tablet  3  . Cinnamon 500 MG capsule Take 1,000 mg by mouth 2 (two) times daily with a meal.      . clopidogrel (PLAVIX) 75 MG tablet Take 1 tablet (75 mg total) by mouth daily with breakfast.  30 tablet  11  . glipiZIDE (GLUCOTROL XL)  10 MG 24 hr tablet Take 10 mg by mouth daily with breakfast.       . metFORMIN (GLUCOPHAGE) 1000 MG tablet Take 1,000 mg by mouth 2 (two) times daily with a meal.      . Multiple Vitamin (MULTIVITAMIN WITH MINERALS) TABS Take 1 tablet by mouth daily.      . nitroGLYCERIN (NITROSTAT) 0.4 MG SL tablet Place 0.4 mg under the tongue every 5 (five) minutes as needed for chest pain.      Marland Kitchen omeprazole (PRILOSEC OTC) 20 MG tablet Take 20 mg by mouth daily as needed (heartburn).      . quinapril (ACCUPRIL) 20 MG tablet Take 20 mg by mouth every morning.       No current facility-administered medications on file prior to visit.     Past Medical History  Diagnosis Date  . MI (myocardial infarction)     "has not seen cardiologist in several years"  . Diabetes mellitus     sees Dr. Hardin Negus in [place]  . Hyperlipidemia   . Vertigo      Past Surgical History  Procedure Laterality Date  . Coronary stent placement    . External fixation leg Left 04/03/2012    Procedure: EXTERNAL FIXATION LEG;  Surgeon: Wylene Simmer, MD;  Location: [facility];  Service: Orthopedics;  Laterality: Left;  . Irrigation and debridement knee Left 04/03/2012  Procedure: IRRIGATION AND DEBRIDEMENT KNEE;  Surgeon: Wylene Simmer, MD;  Location: [facility];  Service: Orthopedics;  Laterality: Left;  . Orif tibia plateau Left 04/14/2012    Procedure: OPEN REDUCTION INTERNAL FIXATION (ORIF) TIBIAL PLATEAU;  Surgeon: Wylene Simmer, MD;  Location: [facility];  Service: Orthopedics;  Laterality: Left;  . External fixation removal Left 04/14/2012    Procedure: REMOVAL EXTERNAL FIXATION LEG;  Surgeon: Wylene Simmer, MD;  Location: [facility];  Service: Orthopedics;  Laterality: Left;     Family History  Problem Relation Age of Onset  . Coronary artery disease Brother   . Cancer Brother   . Coronary artery disease Father      History   Social History  . Marital Status: Single    Spouse Name: N/A    Number of Children: N/A  . Years of Education:  N/A   Occupational History  . heavy Company secretary    Social History Main Topics  . Smoking status: Former Smoker    Quit date: 04/05/1995  . Smokeless tobacco: Not on file  . Alcohol Use: No     Comment: Occassional Use  . Drug Use: No  . Sexual Activity: Not on file   Other Topics Concern  . Not on file   Social History Narrative   Heavy Company secretary.       Divorced      PHYSICAL EXAM   BP 152/84  Pulse 66  Ht 5' 8"  (1.727 m)  Wt 244 lb 8 oz (110.904 kg)  BMI 37.18 kg/m2 Constitutional: He is oriented to person, place, and time. He appears well-developed and well-nourished. No distress.  HENT: No nasal discharge.  Head: Normocephalic and atraumatic.  Eyes: Pupils are equal and round.  No discharge. Neck: Normal range of motion. Neck supple. No JVD present. No thyromegaly present.  Cardiovascular: Normal rate, regular rhythm, normal heart sounds. Exam reveals no gallop and no friction rub. No murmur heard.  Pulmonary/Chest: Effort normal and breath sounds normal. No stridor. No respiratory distress. He has no wheezes. He has no rales. He exhibits no tenderness.  Abdominal: Soft. Bowel sounds are normal. He exhibits no distension. There is no tenderness. There is no rebound and no guarding.  Musculoskeletal: Normal range of motion. He exhibits no edema and no tenderness.  Neurological: He is alert and oriented to person, place, and time. Coordination normal.  Skin: Skin is warm and dry. No rash noted. He is not diaphoretic. No erythema. No pallor.  Psychiatric: He has a normal mood and affect. His behavior is normal. Judgment and thought content normal.       ASSESSMENT AND PLAN

## 2013-12-08 NOTE — Assessment & Plan Note (Signed)
He has mild lower extremity edema. Otherwise he appears to be euvolemic. He is currently New York Heart Association class II. Most recent ejection fraction was 45%. Continue treatment with carvedilol. I increased the dose of quinapril to 40 mg once daily.

## 2013-12-08 NOTE — Assessment & Plan Note (Signed)
He is doing well with no symptoms suggestive of angina. Continue medical therapy.

## 2014-01-18 ENCOUNTER — Encounter (HOSPITAL_COMMUNITY): Payer: Self-pay | Admitting: Cardiovascular Disease

## 2014-02-07 ENCOUNTER — Other Ambulatory Visit: Payer: Self-pay

## 2014-02-07 MED ORDER — ATORVASTATIN CALCIUM 40 MG PO TABS: 40.0000 mg | ORAL_TABLET | Freq: Every day | ORAL | Status: DC

## 2014-02-07 NOTE — Telephone Encounter (Signed)
Refill sent for atorvastatin.

## 2014-04-06 ENCOUNTER — Other Ambulatory Visit: Payer: Self-pay | Admitting: *Deleted

## 2014-04-06 MED ORDER — CLOPIDOGREL BISULFATE 75 MG PO TABS: 75.0000 mg | ORAL_TABLET | Freq: Every day | ORAL | Status: DC

## 2014-06-07 ENCOUNTER — Ambulatory Visit (INDEPENDENT_AMBULATORY_CARE_PROVIDER_SITE_OTHER): Payer: BLUE CROSS/BLUE SHIELD | Admitting: Cardiovascular Disease

## 2014-06-07 ENCOUNTER — Encounter: Payer: Self-pay | Admitting: Cardiovascular Disease

## 2014-06-07 VITALS — BP 130/80 | HR 65 | Ht 67.0 in | Wt 245.2 lb

## 2014-06-07 DIAGNOSIS — I2510 Atherosclerotic heart disease of native coronary artery without angina pectoris: Secondary | ICD-10-CM | POA: Diagnosis not present

## 2014-06-07 DIAGNOSIS — I10 Essential (primary) hypertension: Secondary | ICD-10-CM | POA: Diagnosis not present

## 2014-06-07 DIAGNOSIS — E782 Mixed hyperlipidemia: Secondary | ICD-10-CM | POA: Diagnosis not present

## 2014-06-07 DIAGNOSIS — I5022 Chronic systolic (congestive) heart failure: Secondary | ICD-10-CM | POA: Diagnosis not present

## 2014-06-07 DIAGNOSIS — E785 Hyperlipidemia, unspecified: Secondary | ICD-10-CM

## 2014-06-07 NOTE — Assessment & Plan Note (Addendum)
He is doing reasonably well with no symptoms suggestive of angina. Continue medical therapy. The plan is to continue dual antiplatelet therapy for at least 2 years.

## 2014-06-07 NOTE — Assessment & Plan Note (Signed)
He has mild lower extremity edema. Otherwise he appears to be euvolemic. He is currently New York Heart Association class II. Most recent ejection fraction was 45%. Continue treatment with carvedilol and quinapril.

## 2014-06-07 NOTE — Assessment & Plan Note (Signed)
Blood pressure is not well controlled. Check basic metabolic profile.

## 2014-06-07 NOTE — Patient Instructions (Signed)
Medication Instructions: - no changes  Labwork: - Your physician recommends that you have lab work today: lipid/liver/bmp   Procedures/Testing: - none  Follow-Up: - Your physician wants you to follow-up in: 6 months with Dr. Fletcher Anon. You will receive a reminder letter in the mail two months in advance. If you don't receive a letter, please call our office to schedule the follow-up appointment.   Any Additional Special Instructions Will Be Listed Below (If Applicable). - none

## 2014-06-07 NOTE — Assessment & Plan Note (Signed)
Continue treatment with atorvastatin. Check fasting lipid and liver profile today.

## 2014-06-07 NOTE — Progress Notes (Signed)
Primary care physician: Dr. Laurian Brim  HPI  [name] is a 64 year old man who is here for a followup visit regarding coronary artery disease and cardiomyopathy.  He has known CAD with past RCA stents and prior MI, GERD, DM, HTN and HLD.  He presented in February of 2015 with sudden onset of SOB - required Lasix and Bipap - ruled in for MI -cardiac catheterization showed 99% in-stent restenosis in the right coronary artery which was heavily calcified. There was also 60-70% distal LAD stenosis. EF was 30-35% by echo. He did have left bundle branch block. RCA PCI was attempted and was initially unsuccessful due to inability to dilate the calcified lesion. I performed successful  rotational atherectomy, cutting balloon angioplasty and DES to the proximal RCA .  Echocardiogram in 09/2013 showed EF of 45-50% with mild MR.  During last visit, I increased the dose of Quinapril to 40 mg once daily.  He has been doing very well since then and denies any chest pain,orthopnea or PND. He uses Lasix only as needed .  He continues to have mild to moderate exertional dyspnea.   No Known Allergies   Current Outpatient Prescriptions on File Prior to Visit  Medication Sig Dispense Refill  . aspirin EC 81 MG EC tablet Take 1 tablet (81 mg total) by mouth daily. 30 tablet 0  . atorvastatin (LIPITOR) 40 MG tablet Take 1 tablet (40 mg total) by mouth daily. 90 tablet 3  . carvedilol (COREG) 12.5 MG tablet Take 1 tablet (12.5 mg total) by mouth 2 (two) times daily. 180 tablet 3  . Cinnamon 500 MG capsule Take 1,000 mg by mouth 2 (two) times daily with a meal.    . clopidogrel (PLAVIX) 75 MG tablet Take 1 tablet (75 mg total) by mouth daily with breakfast. 30 tablet 3  . glipiZIDE (GLUCOTROL XL) 10 MG 24 hr tablet Take 10 mg by mouth daily with breakfast.     . metFORMIN (GLUCOPHAGE) 1000 MG tablet Take 1,000 mg by mouth 2 (two) times daily with a meal.    . Multiple Vitamin (MULTIVITAMIN WITH MINERALS) TABS  Take 1 tablet by mouth daily.    . nitroGLYCERIN (NITROSTAT) 0.4 MG SL tablet Place 0.4 mg under the tongue every 5 (five) minutes as needed for chest pain.    Marland Kitchen omeprazole (PRILOSEC OTC) 20 MG tablet Take 20 mg by mouth daily as needed (heartburn).    . quinapril (ACCUPRIL) 40 MG tablet Take 1 tablet (40 mg total) by mouth daily. 30 tablet 6   No current facility-administered medications on file prior to visit.     Past Medical History  Diagnosis Date  . MI (myocardial infarction)     "has not seen cardiologist in several years"  . Diabetes mellitus     sees Dr. Hardin Negus in [place]  . Hyperlipidemia   . Vertigo      Past Surgical History  Procedure Laterality Date  . Coronary stent placement    . External fixation leg Left 04/03/2012    Procedure: EXTERNAL FIXATION LEG;  Surgeon: Wylene Simmer, MD;  Location: [facility];  Service: Orthopedics;  Laterality: Left;  . Irrigation and debridement knee Left 04/03/2012    Procedure: IRRIGATION AND DEBRIDEMENT KNEE;  Surgeon: Wylene Simmer, MD;  Location: [facility];  Service: Orthopedics;  Laterality: Left;  . Orif tibia plateau Left 04/14/2012    Procedure: OPEN REDUCTION INTERNAL FIXATION (ORIF) TIBIAL PLATEAU;  Surgeon: Wylene Simmer, MD;  Location: [facility];  Service: Orthopedics;  Laterality: Left;  . External fixation removal Left 04/14/2012    Procedure: REMOVAL EXTERNAL FIXATION LEG;  Surgeon: Wylene Simmer, MD;  Location: [facility];  Service: Orthopedics;  Laterality: Left;  . Left heart catheterization with coronary angiogram N/A 04/05/2013    Procedure: LEFT HEART CATHETERIZATION WITH CORONARY ANGIOGRAM;  Surgeon: [name], MD;  Location: [facility] CATH LAB;  Service: Cardiovascular;  Laterality: N/A;  . Percutaneous coronary rotoblator intervention (pci-r) N/A 04/06/2013    Procedure: PERCUTANEOUS CORONARY ROTOBLATOR INTERVENTION (PCI-R);  Surgeon: [name], MD;  Location: [facility] CATH LAB;  Service: Cardiovascular;  Laterality: N/A;     Family  History  Problem Relation Age of Onset  . Coronary artery disease Brother   . Cancer Brother   . Coronary artery disease Father      History   Social History  . Marital Status: Single    Spouse Name: N/A  . Number of Children: N/A  . Years of Education: N/A   Occupational History  . heavy Company secretary    Social History Main Topics  . Smoking status: Former Smoker    Quit date: 04/05/1995  . Smokeless tobacco: Not on file  . Alcohol Use: No     Comment: Occassional Use  . Drug Use: No  . Sexual Activity: Not on file   Other Topics Concern  . Not on file   Social History Narrative   Heavy Company secretary.       Divorced      PHYSICAL EXAM   BP 130/80 mmHg  Pulse 65  Ht 5' 7"  (1.702 m)  Wt 245 lb 4 oz (111.245 kg)  BMI 38.40 kg/m2 Constitutional: He is oriented to person, place, and time. He appears well-developed and well-nourished. No distress.  HENT: No nasal discharge.  Head: Normocephalic and atraumatic.  Eyes: Pupils are equal and round.  No discharge. Neck: Normal range of motion. Neck supple. No JVD present. No thyromegaly present.  Cardiovascular: Normal rate, regular rhythm, normal heart sounds. Exam reveals no gallop and no friction rub. No murmur heard.  Pulmonary/Chest: Effort normal and breath sounds normal. No stridor. No respiratory distress. He has no wheezes. He has no rales. He exhibits no tenderness.  Abdominal: Soft. Bowel sounds are normal. He exhibits no distension. There is no tenderness. There is no rebound and no guarding.  Musculoskeletal: Normal range of motion. He exhibits chronic left leg edema and no tenderness.  Neurological: He is alert and oriented to person, place, and time. Coordination normal.  Skin: Skin is warm and dry. No rash noted. He is not diaphoretic. No erythema. No pallor.  Psychiatric: He has a normal mood and affect. His behavior is normal. Judgment and thought content normal.     BPJ:PETKK  Rhythm  -  occasional ectopic ventricular beat    Low voltage in limb leads.   -Nonspecific QRS widening.   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   ABNORMAL   ASSESSMENT AND PLAN

## 2014-06-08 LAB — BASIC METABOLIC PANEL
BUN / CREAT RATIO: 10 (ref 10–22)
BUN: 9 mg/dL (ref 8–27)
CO2: 21 mmol/L (ref 18–29)
CREATININE: 0.92 mg/dL (ref 0.76–1.27)
Calcium: 8.7 mg/dL (ref 8.6–10.2)
Chloride: 99 mmol/L (ref 97–108)
GFR calc Af Amer: 100 mL/min/{1.73_m2} (ref >59)
GFR calc non Af Amer: 86 mL/min/{1.73_m2} (ref >59)
GLUCOSE: 192 mg/dL — AB (ref 65–99)
Potassium: 4.5 mmol/L (ref 3.5–5.2)
SODIUM: 140 mmol/L (ref 134–144)

## 2014-06-08 LAB — HEPATIC FUNCTION PANEL
ALBUMIN: 4 g/dL (ref 3.6–4.8)
ALT: 38 IU/L (ref 0–44)
AST: 48 IU/L — ABNORMAL HIGH (ref 0–40)
Alkaline Phosphatase: 94 IU/L (ref 39–117)
Bilirubin Total: 0.6 mg/dL (ref 0.0–1.2)
Bilirubin, Direct: 0.23 mg/dL (ref 0.00–0.40)
Total Protein: 6.5 g/dL (ref 6.0–8.5)

## 2014-06-08 LAB — LIPID PANEL
CHOL/HDL RATIO: 2.7 ratio (ref 0.0–5.0)
CHOLESTEROL TOTAL: 103 mg/dL (ref 100–199)
HDL: 38 mg/dL — ABNORMAL LOW (ref >39)
LDL Calculated: 41 mg/dL (ref 0–99)
TRIGLYCERIDES: 121 mg/dL (ref 0–149)
VLDL Cholesterol Cal: 24 mg/dL (ref 5–40)

## 2014-06-11 ENCOUNTER — Other Ambulatory Visit: Payer: Self-pay | Admitting: *Deleted

## 2014-06-11 MED ORDER — QUINAPRIL HCL 40 MG PO TABS: 40.0000 mg | ORAL_TABLET | Freq: Every day | ORAL | Status: DC

## 2014-06-12 ENCOUNTER — Other Ambulatory Visit: Payer: Self-pay

## 2014-06-12 MED ORDER — CARVEDILOL 12.5 MG PO TABS: 12.5000 mg | ORAL_TABLET | Freq: Two times a day (BID) | ORAL | Status: DC

## 2014-06-12 NOTE — Telephone Encounter (Signed)
Refill sent for carvedilol 12.5 mg

## 2014-08-14 ENCOUNTER — Other Ambulatory Visit: Payer: Self-pay | Admitting: *Deleted

## 2014-08-14 MED ORDER — CLOPIDOGREL BISULFATE 75 MG PO TABS: 75.0000 mg | ORAL_TABLET | Freq: Every day | ORAL | Status: DC

## 2014-09-08 ENCOUNTER — Encounter (HOSPITAL_COMMUNITY): Payer: Self-pay | Admitting: Emergency Medicine

## 2014-09-08 ENCOUNTER — Inpatient Hospital Stay (HOSPITAL_COMMUNITY)
Admission: EM | Admit: 2014-09-08 | Discharge: 2014-09-12 | DRG: 291 | Disposition: A | Discharge status: Home / Self Care | Payer: BLUE CROSS/BLUE SHIELD | Attending: Internal Medicine | Admitting: Internal Medicine

## 2014-09-08 ENCOUNTER — Emergency Department (HOSPITAL_COMMUNITY): Payer: BLUE CROSS/BLUE SHIELD

## 2014-09-08 DIAGNOSIS — Z7982 Long term (current) use of aspirin: Secondary | ICD-10-CM | POA: Diagnosis not present

## 2014-09-08 DIAGNOSIS — E872 Acidosis: Secondary | ICD-10-CM | POA: Diagnosis present

## 2014-09-08 DIAGNOSIS — T39015A Adverse effect of aspirin, initial encounter: Secondary | ICD-10-CM | POA: Diagnosis not present

## 2014-09-08 DIAGNOSIS — R1110 Vomiting, unspecified: Secondary | ICD-10-CM | POA: Diagnosis not present

## 2014-09-08 DIAGNOSIS — K219 Gastro-esophageal reflux disease without esophagitis: Secondary | ICD-10-CM | POA: Diagnosis present

## 2014-09-08 DIAGNOSIS — R710 Precipitous drop in hematocrit: Secondary | ICD-10-CM | POA: Diagnosis not present

## 2014-09-08 DIAGNOSIS — R0600 Dyspnea, unspecified: Secondary | ICD-10-CM

## 2014-09-08 DIAGNOSIS — E1165 Type 2 diabetes mellitus with hyperglycemia: Secondary | ICD-10-CM | POA: Diagnosis present

## 2014-09-08 DIAGNOSIS — I255 Ischemic cardiomyopathy: Secondary | ICD-10-CM | POA: Diagnosis present

## 2014-09-08 DIAGNOSIS — J9601 Acute respiratory failure with hypoxia: Secondary | ICD-10-CM | POA: Diagnosis present

## 2014-09-08 DIAGNOSIS — I10 Essential (primary) hypertension: Secondary | ICD-10-CM | POA: Diagnosis present

## 2014-09-08 DIAGNOSIS — G4733 Obstructive sleep apnea (adult) (pediatric): Secondary | ICD-10-CM | POA: Diagnosis present

## 2014-09-08 DIAGNOSIS — Z6837 Body mass index (BMI) 37.0-37.9, adult: Secondary | ICD-10-CM

## 2014-09-08 DIAGNOSIS — E785 Hyperlipidemia, unspecified: Secondary | ICD-10-CM | POA: Diagnosis present

## 2014-09-08 DIAGNOSIS — I509 Heart failure, unspecified: Secondary | ICD-10-CM | POA: Diagnosis not present

## 2014-09-08 DIAGNOSIS — E669 Obesity, unspecified: Secondary | ICD-10-CM | POA: Diagnosis present

## 2014-09-08 DIAGNOSIS — E1140 Type 2 diabetes mellitus with diabetic neuropathy, unspecified: Secondary | ICD-10-CM | POA: Diagnosis present

## 2014-09-08 DIAGNOSIS — I5022 Chronic systolic (congestive) heart failure: Secondary | ICD-10-CM | POA: Diagnosis present

## 2014-09-08 DIAGNOSIS — K270 Acute peptic ulcer, site unspecified, with hemorrhage: Secondary | ICD-10-CM | POA: Diagnosis not present

## 2014-09-08 DIAGNOSIS — I248 Other forms of acute ischemic heart disease: Secondary | ICD-10-CM | POA: Diagnosis present

## 2014-09-08 DIAGNOSIS — K279 Peptic ulcer, site unspecified, unspecified as acute or chronic, without hemorrhage or perforation: Secondary | ICD-10-CM

## 2014-09-08 DIAGNOSIS — Z87891 Personal history of nicotine dependence: Secondary | ICD-10-CM

## 2014-09-08 DIAGNOSIS — D72829 Elevated white blood cell count, unspecified: Secondary | ICD-10-CM | POA: Diagnosis not present

## 2014-09-08 DIAGNOSIS — Z955 Presence of coronary angioplasty implant and graft: Secondary | ICD-10-CM

## 2014-09-08 DIAGNOSIS — K921 Melena: Secondary | ICD-10-CM | POA: Diagnosis not present

## 2014-09-08 DIAGNOSIS — Z7902 Long term (current) use of antithrombotics/antiplatelets: Secondary | ICD-10-CM | POA: Diagnosis not present

## 2014-09-08 DIAGNOSIS — Z79899 Other long term (current) drug therapy: Secondary | ICD-10-CM

## 2014-09-08 DIAGNOSIS — J810 Acute pulmonary edema: Secondary | ICD-10-CM

## 2014-09-08 DIAGNOSIS — K922 Gastrointestinal hemorrhage, unspecified: Secondary | ICD-10-CM | POA: Diagnosis not present

## 2014-09-08 DIAGNOSIS — I5043 Acute on chronic combined systolic (congestive) and diastolic (congestive) heart failure: Principal | ICD-10-CM | POA: Diagnosis present

## 2014-09-08 DIAGNOSIS — J9600 Acute respiratory failure, unspecified whether with hypoxia or hypercapnia: Secondary | ICD-10-CM | POA: Diagnosis not present

## 2014-09-08 DIAGNOSIS — I2510 Atherosclerotic heart disease of native coronary artery without angina pectoris: Secondary | ICD-10-CM | POA: Diagnosis present

## 2014-09-08 DIAGNOSIS — J811 Chronic pulmonary edema: Secondary | ICD-10-CM | POA: Diagnosis present

## 2014-09-08 DIAGNOSIS — Z8249 Family history of ischemic heart disease and other diseases of the circulatory system: Secondary | ICD-10-CM | POA: Diagnosis not present

## 2014-09-08 DIAGNOSIS — Z8674 Personal history of sudden cardiac arrest: Secondary | ICD-10-CM | POA: Diagnosis not present

## 2014-09-08 DIAGNOSIS — E119 Type 2 diabetes mellitus without complications: Secondary | ICD-10-CM

## 2014-09-08 DIAGNOSIS — K254 Chronic or unspecified gastric ulcer with hemorrhage: Secondary | ICD-10-CM | POA: Diagnosis not present

## 2014-09-08 DIAGNOSIS — I5023 Acute on chronic systolic (congestive) heart failure: Secondary | ICD-10-CM | POA: Diagnosis not present

## 2014-09-08 DIAGNOSIS — R55 Syncope and collapse: Secondary | ICD-10-CM | POA: Diagnosis present

## 2014-09-08 DIAGNOSIS — I252 Old myocardial infarction: Secondary | ICD-10-CM | POA: Diagnosis not present

## 2014-09-08 DIAGNOSIS — I959 Hypotension, unspecified: Secondary | ICD-10-CM | POA: Diagnosis not present

## 2014-09-08 LAB — COMPREHENSIVE METABOLIC PANEL
ALBUMIN: 3.1 g/dL — AB (ref 3.5–5.0)
ALT: 37 U/L (ref 17–63)
AST: 49 U/L — AB (ref 15–41)
Alkaline Phosphatase: 162 U/L — ABNORMAL HIGH (ref 38–126)
Anion gap: 11 (ref 5–15)
BUN: 9 mg/dL (ref 6–20)
CO2: 21 mmol/L — AB (ref 22–32)
Calcium: 8.4 mg/dL — ABNORMAL LOW (ref 8.9–10.3)
Chloride: 105 mmol/L (ref 101–111)
Creatinine, Ser: 1.07 mg/dL (ref 0.61–1.24)
GFR calc Af Amer: >60 mL/min (ref >60)
GFR calc non Af Amer: >60 mL/min (ref >60)
GLUCOSE: 276 mg/dL — AB (ref 65–99)
POTASSIUM: 5.1 mmol/L (ref 3.5–5.1)
SODIUM: 137 mmol/L (ref 135–145)
Total Bilirubin: 1.1 mg/dL (ref 0.3–1.2)
Total Protein: 6.7 g/dL (ref 6.5–8.1)

## 2014-09-08 LAB — I-STAT ARTERIAL BLOOD GAS, ED
Acid-base deficit: 6 mmol/L — ABNORMAL HIGH (ref 0.0–2.0)
BICARBONATE: 20.8 meq/L (ref 20.0–24.0)
O2 Saturation: 96 %
PH ART: 7.293 — AB (ref 7.350–7.450)
TCO2: 22 mmol/L (ref 0–100)
pCO2 arterial: 43 mmHg (ref 35.0–45.0)
pO2, Arterial: 88 mmHg (ref 80.0–100.0)

## 2014-09-08 LAB — CBC WITH DIFFERENTIAL/PLATELET
Basophils Absolute: 0 10*3/uL (ref 0.0–0.1)
Basophils Relative: 1 % (ref 0–1)
Eosinophils Absolute: 0.2 10*3/uL (ref 0.0–0.7)
Eosinophils Relative: 2 % (ref 0–5)
HEMATOCRIT: 44.9 % (ref 39.0–52.0)
HEMOGLOBIN: 14.9 g/dL (ref 13.0–17.0)
Lymphocytes Relative: 23 % (ref 12–46)
Lymphs Abs: 2 10*3/uL (ref 0.7–4.0)
MCH: 30.3 pg (ref 26.0–34.0)
MCHC: 33.2 g/dL (ref 30.0–36.0)
MCV: 91.4 fL (ref 78.0–100.0)
MONO ABS: 0.6 10*3/uL (ref 0.1–1.0)
Monocytes Relative: 6 % (ref 3–12)
Neutro Abs: 6 10*3/uL (ref 1.7–7.7)
Neutrophils Relative %: 68 % (ref 43–77)
Platelets: 225 10*3/uL (ref 150–400)
RBC: 4.91 MIL/uL (ref 4.22–5.81)
RDW: 15.5 % (ref 11.5–15.5)
WBC: 8.8 10*3/uL (ref 4.0–10.5)

## 2014-09-08 LAB — TROPONIN I
Troponin I: 0.03 ng/mL (ref <0.031)
Troponin I: 0.34 ng/mL — ABNORMAL HIGH (ref <0.031)

## 2014-09-08 LAB — GLUCOSE, CAPILLARY
GLUCOSE-CAPILLARY: 242 mg/dL — AB (ref 65–99)
Glucose-Capillary: 234 mg/dL — ABNORMAL HIGH (ref 65–99)
Glucose-Capillary: 294 mg/dL — ABNORMAL HIGH (ref 65–99)

## 2014-09-08 LAB — MRSA PCR SCREENING: MRSA BY PCR: NEGATIVE

## 2014-09-08 LAB — I-STAT TROPONIN, ED: Troponin i, poc: 0.03 ng/mL (ref 0.00–0.08)

## 2014-09-08 LAB — I-STAT CG4 LACTIC ACID, ED: LACTIC ACID, VENOUS: 4.71 mmol/L — AB (ref 0.5–2.0)

## 2014-09-08 LAB — BRAIN NATRIURETIC PEPTIDE: B Natriuretic Peptide: 296 pg/mL — ABNORMAL HIGH (ref 0.0–100.0)

## 2014-09-08 MED ORDER — IPRATROPIUM-ALBUTEROL 0.5-2.5 (3) MG/3ML IN SOLN: 3.0000 mL | Freq: Once | RESPIRATORY_TRACT | Status: DC

## 2014-09-08 MED ORDER — FUROSEMIDE 10 MG/ML IJ SOLN
40.0000 mg | Freq: Two times a day (BID) | INTRAMUSCULAR | Status: DC
  Administered 2014-09-09: 40 mg via INTRAVENOUS
  Filled 2014-09-08 (×2): qty 4

## 2014-09-08 MED ORDER — GABAPENTIN 100 MG PO CAPS
100.0000 mg | ORAL_CAPSULE | Freq: Three times a day (TID) | ORAL | Status: DC
  Administered 2014-09-08 – 2014-09-09 (×3): 100 mg via ORAL
  Filled 2014-09-08 (×5): qty 1

## 2014-09-08 MED ORDER — CLOPIDOGREL BISULFATE 75 MG PO TABS
75.0000 mg | ORAL_TABLET | Freq: Every day | ORAL | Status: DC
  Administered 2014-09-09 – 2014-09-12 (×4): 75 mg via ORAL
  Filled 2014-09-08 (×5): qty 1

## 2014-09-08 MED ORDER — OMEPRAZOLE MAGNESIUM 20 MG PO TBEC: 20.0000 mg | DELAYED_RELEASE_TABLET | Freq: Every day | ORAL | Status: DC | PRN

## 2014-09-08 MED ORDER — NITROGLYCERIN IN D5W 200-5 MCG/ML-% IV SOLN
10.0000 ug/min | INTRAVENOUS | Status: DC
  Administered 2014-09-08: 5 ug/min via INTRAVENOUS
  Administered 2014-09-08: 10 ug/min via INTRAVENOUS
  Filled 2014-09-08: qty 250

## 2014-09-08 MED ORDER — NITROGLYCERIN 0.4 MG SL SUBL: 0.4000 mg | SUBLINGUAL_TABLET | SUBLINGUAL | Status: DC | PRN

## 2014-09-08 MED ORDER — CARVEDILOL 12.5 MG PO TABS
12.5000 mg | ORAL_TABLET | Freq: Two times a day (BID) | ORAL | Status: DC
  Administered 2014-09-08 – 2014-09-10 (×5): 12.5 mg via ORAL
  Filled 2014-09-08 (×6): qty 1

## 2014-09-08 MED ORDER — ENOXAPARIN SODIUM 40 MG/0.4ML ~~LOC~~ SOLN
40.0000 mg | SUBCUTANEOUS | Status: DC
  Administered 2014-09-08 – 2014-09-11 (×4): 40 mg via SUBCUTANEOUS
  Filled 2014-09-08 (×4): qty 0.4

## 2014-09-08 MED ORDER — NITROGLYCERIN 0.4 MG SL SUBL
SUBLINGUAL_TABLET | SUBLINGUAL | Status: AC
  Administered 2014-09-08: 0.4 mg via SUBLINGUAL
  Filled 2014-09-08: qty 1

## 2014-09-08 MED ORDER — NITROGLYCERIN 0.4 MG SL SUBL
0.4000 mg | SUBLINGUAL_TABLET | Freq: Once | SUBLINGUAL | Status: AC
  Administered 2014-09-08: 0.4 mg via SUBLINGUAL

## 2014-09-08 MED ORDER — FUROSEMIDE 10 MG/ML IJ SOLN
40.0000 mg | Freq: Every day | INTRAMUSCULAR | Status: DC
  Administered 2014-09-08: 40 mg via INTRAVENOUS
  Filled 2014-09-08: qty 4

## 2014-09-08 MED ORDER — QUINAPRIL HCL 10 MG PO TABS
40.0000 mg | ORAL_TABLET | Freq: Every day | ORAL | Status: DC
  Administered 2014-09-08 – 2014-09-11 (×4): 40 mg via ORAL
  Filled 2014-09-08 (×4): qty 4

## 2014-09-08 MED ORDER — ATORVASTATIN CALCIUM 40 MG PO TABS
40.0000 mg | ORAL_TABLET | Freq: Every day | ORAL | Status: DC
Start: 2014-09-08 — End: 2014-09-12
  Administered 2014-09-08 – 2014-09-12 (×5): 40 mg via ORAL
  Filled 2014-09-08 (×5): qty 1

## 2014-09-08 MED ORDER — ASPIRIN EC 81 MG PO TBEC
81.0000 mg | DELAYED_RELEASE_TABLET | Freq: Every day | ORAL | Status: DC
  Administered 2014-09-08 – 2014-09-11 (×4): 81 mg via ORAL
  Filled 2014-09-08 (×4): qty 1

## 2014-09-08 MED ORDER — INSULIN ASPART 100 UNIT/ML ~~LOC~~ SOLN
0.0000 [IU] | Freq: Three times a day (TID) | SUBCUTANEOUS | Status: DC
  Administered 2014-09-08: 5 [IU] via SUBCUTANEOUS
  Administered 2014-09-09: 1 [IU] via SUBCUTANEOUS
  Administered 2014-09-09: 2 [IU] via SUBCUTANEOUS
  Administered 2014-09-10: 1 [IU] via SUBCUTANEOUS
  Administered 2014-09-10 (×2): 2 [IU] via SUBCUTANEOUS
  Administered 2014-09-11: 1 [IU] via SUBCUTANEOUS
  Administered 2014-09-11: 3 [IU] via SUBCUTANEOUS
  Administered 2014-09-11: 1 [IU] via SUBCUTANEOUS
  Administered 2014-09-12: 2 [IU] via SUBCUTANEOUS

## 2014-09-08 MED ORDER — PNEUMOCOCCAL VAC POLYVALENT 25 MCG/0.5ML IJ INJ
0.5000 mL | INJECTION | INTRAMUSCULAR | Status: AC
  Administered 2014-09-09: 0.5 mL via INTRAMUSCULAR
  Filled 2014-09-08: qty 0.5

## 2014-09-08 MED ORDER — CALCIUM CARBONATE ANTACID 500 MG PO CHEW: 1.0000 | CHEWABLE_TABLET | ORAL | Status: DC | PRN

## 2014-09-08 NOTE — H&P (Signed)
Date: 09/08/2014               Patient Name:  Devin Hanna MRN: [id number]  DOB: 11-Dec-1950 Age / Sex: 64 y.o., male   PCP: [name]., MD         Medical Service: Internal Medicine Teaching Service         Attending Physician: Dr. Sid Falcon, MD    First Contact: Effie Berkshire Pager: [phone]  Second Contact: Dr. Denton Brick Pager: [phone]       After Hours (After 5p/  First Contact Pager: [phone]  weekends / holidays): Second Contact Pager: [phone]   Chief Complaint: Loss of consciousness  History of Present Illness: 70 y o M  With PMH of CAD s/p stents and andartherectomy- events in 1997 and 2015., CHF- EF- 44-31% with mild systolic dysfunction, HTN, DM,  HLD. Presented today with loss of consciousness, he says he woke up in his normal state of health, and later became suddenly SOB and has some chest tightness/pain. He decided to call a family member and 102, as he did not and to be alone. He says he sat down on his bed, that's the last he remembers. When EMS got there, he was found unconscious and lying on his bed. He doesn't think he fell or had any contact with the floor.   He has had a similar episode in 2015, and then he ruled in for MI. Chest pain presently feels like a tightness, non radiating, no associated SOB or diaphoresis. He denies SOB at baseline but has stable 2 pillow orthopnea- for 1-2 years, denies prior fevers, headaches, denies palpitations. He thinks he  Has gained some weight in his abdomen over the past month, he says his weight at home is stable- 210- 220. He denies change in vision, weakness or numbness of extremities. He is supposed to use CPAP to sleep, but doesn't. Pt quit smoking 15-16 years ago, takes alcohol occasionally, denies substance abuse or illicit drug use. He lives a very active lifestyle. He says he is complaint with all his medications.  Patient was placed on Bipap when he came to the Ed, as O2 sats- 40%, he mental status  significantly improved when he came to the ED, and did not require bipap, Bp was elevated in teh ED- 198/94. He was placed on nitroglycerin infusion.  Cardiology was consulted.  Meds: Current Facility-Administered Medications  Medication Dose Route Frequency Provider Last Rate Last Dose  . nitroGLYCERIN 50 mg in dextrose 5 % 250 mL (0.2 mg/mL) infusion  10-200 mcg/min Intravenous Titrated Sherwood Gambler, MD   Stopped at 09/08/14 5400   Current Outpatient Prescriptions  Medication Sig Dispense Refill  . aspirin EC 81 MG EC tablet Take 1 tablet (81 mg total) by mouth daily. 30 tablet 0  . atorvastatin (LIPITOR) 40 MG tablet Take 1 tablet (40 mg total) by mouth daily. 90 tablet 3  . calcium carbonate (TUMS - DOSED IN MG ELEMENTAL CALCIUM) 500 MG chewable tablet Chew 1 tablet by mouth as needed for indigestion or heartburn.    . carvedilol (COREG) 12.5 MG tablet Take 1 tablet (12.5 mg total) by mouth 2 (two) times daily. 180 tablet 3  . Cinnamon 500 MG capsule Take 1,000 mg by mouth 2 (two) times daily with a meal.    . clopidogrel (PLAVIX) 75 MG tablet Take 1 tablet (75 mg total) by mouth daily with breakfast. 30 tablet 3  . furosemide (LASIX) 20 MG  tablet Take 20 mg by mouth daily as needed (swelling).    . gabapentin (NEURONTIN) 100 MG capsule Take 100 mg by mouth 3 (three) times daily.    Marland Kitchen glipiZIDE (GLUCOTROL XL) 10 MG 24 hr tablet Take 10 mg by mouth daily with breakfast.     . metFORMIN (GLUCOPHAGE) 1000 MG tablet Take 1,000 mg by mouth 2 (two) times daily with a meal.    . Multiple Vitamin (MULTIVITAMIN WITH MINERALS) TABS Take 1 tablet by mouth daily.    Marland Kitchen omeprazole (PRILOSEC OTC) 20 MG tablet Take 20 mg by mouth daily as needed (heartburn).    . quinapril (ACCUPRIL) 40 MG tablet Take 1 tablet (40 mg total) by mouth daily. 30 tablet 3  . nitroGLYCERIN (NITROSTAT) 0.4 MG SL tablet Place 0.4 mg under the tongue every 5 (five) minutes as needed for chest pain.      Allergies: Allergies  as of 09/08/2014  . (No Known Allergies)   Past Medical History  Diagnosis Date  . MI (myocardial infarction)     "has not seen cardiologist in several years"  . Diabetes mellitus     sees Dr. Hardin Negus in [place]  . Hyperlipidemia   . Vertigo    Past Surgical History  Procedure Laterality Date  . Coronary stent placement    . External fixation leg Left 04/03/2012    Procedure: EXTERNAL FIXATION LEG;  Surgeon: Wylene Simmer, MD;  Location: [facility];  Service: Orthopedics;  Laterality: Left;  . Irrigation and debridement knee Left 04/03/2012    Procedure: IRRIGATION AND DEBRIDEMENT KNEE;  Surgeon: Wylene Simmer, MD;  Location: [place];  Service: Orthopedics;  Laterality: Left;  . Orif tibia plateau Left 04/14/2012    Procedure: OPEN REDUCTION INTERNAL FIXATION (ORIF) TIBIAL PLATEAU;  Surgeon: Wylene Simmer, MD;  Location: [place];  Service: Orthopedics;  Laterality: Left;  . External fixation removal Left 04/14/2012    Procedure: REMOVAL EXTERNAL FIXATION LEG;  Surgeon: Wylene Simmer, MD;  Location: [facility];  Service: Orthopedics;  Laterality: Left;  . Left heart catheterization with coronary angiogram N/A 04/05/2013    Procedure: LEFT HEART CATHETERIZATION WITH CORONARY ANGIOGRAM;  Surgeon: [name], MD;  Location: [place] CATH LAB;  Service: Cardiovascular;  Laterality: N/A;  . Percutaneous coronary rotoblator intervention (pci-r) N/A 04/06/2013    Procedure: PERCUTANEOUS CORONARY ROTOBLATOR INTERVENTION (PCI-R);  Surgeon: [name], MD;  Location: [facility] CATH LAB;  Service: Cardiovascular;  Laterality: N/A;   Family History  Problem Relation Age of Onset  . Coronary artery disease Brother   . Cancer Brother   . Coronary artery disease Father    History   Social History  . Marital Status: Single    Spouse Name: N/A  . Number of Children: N/A  . Years of Education: N/A   Occupational History  . heavy Company secretary    Social History Main Topics  . Smoking status: Former Smoker     Quit date: 04/05/1995  . Smokeless tobacco: Not on file  . Alcohol Use: No     Comment: Occassional Use  . Drug Use: No  . Sexual Activity: Not on file   Other Topics Concern  . Not on file   Social History Narrative   Heavy Company secretary.       Divorced    Review of Systems: CONSTITUTIONAL- No Fever, weightloss, night sweat or change in appetite. SKIN- No Rash, colour changes or itching. HEAD- No Headache or dizziness. RESPIRATORY- No Cough. Abd- No  diarrhea or vomiting URINARY- No Frequency, urgency, or dysuria. NEUROLOGIC- No Numbness, syncope, seizures or burning. [facility]- Denies depression or anxiety.  Physical Exam: Blood pressure 138/77, pulse 71, temperature 99.1 F (37.3 C), temperature source Rectal, resp. rate 20, height 5' 7"  (1.702 m), weight 240 lb (108.863 kg), SpO2 92 %. GENERAL- alert, co-operative, appears as stated age, not in any distress. HEENT- Atraumatic, normocephalic, PERRL, EOMI, oral mucosa appears moist, has hepatojugular reflux  CARDIAC- RRR, no murmurs, rubs or gallops. RESP- Moving equal volumes of air, expiratory wheezes heard in all lung fields ABDOMEN- Soft, nontender, full, no palpable masses or organomegaly, bowel sounds present. BACK- Normal curvature of the spine, No tenderness along the vertebrae, no CVA tenderness. NEURO- No obvious Cr N abnormality, strenght upper and lower extremities- 5/5 EXTREMITIES- pulse 2+, symmetric, minimal pedal edema. SKIN- Warm, dry, No rash or lesion. PSYCH- Normal mood and affect, appropriate thought content and speech.  Lab results: Basic Metabolic Panel:  Recent Labs  09/08/14 0847  NA 137  K 5.1  CL 105  CO2 21*  GLUCOSE 276*  BUN 9  CREATININE 1.07  CALCIUM 8.4*   Liver Function Tests:  Recent Labs  09/08/14 0847  AST 49*  ALT 37  ALKPHOS 162*  BILITOT 1.1  PROT 6.7  ALBUMIN 3.1*   CBC:  Recent Labs  09/08/14 0847  WBC 8.8  NEUTROABS 6.0  HGB 14.9  HCT 44.9  MCV  91.4  PLT 225   Cardiac Enzymes:  Recent Labs  09/08/14 0847  TROPONINI 0.03   Imaging results:  Dg Chest Port 1 View  09/08/2014   CLINICAL DATA:  Hypoxia and shortness of breath.  EXAM: PORTABLE CHEST - 1 VIEW  COMPARISON:  04/04/2013  FINDINGS: Cardiomegaly noted. Bilateral interstitial and airspace opacities likely represent moderate pulmonary edema.  There is no evidence of pneumothorax.  No large pleural effusions are identified.  No acute bony abnormalities are identified.  IMPRESSION: Bilateral interstitial and airspace opacities likely representing moderate pulmonary edema. Cardiomegaly again identified.   Electronically Signed   By: Margarette Canada M.D.   On: 09/08/2014 09:11    Other results: EKG: Sinus rhythm- rate- 90 bpm, regular, PR interval- 90, prolonged QRS - 157, LBBB- old, artifacts present, prolonged Qtc- 531. No significant ST abnormalities.   Assessment & Plan by Problem: Principal Problem:   Acute respiratory failure with hypoxia Active Problems:   MI (myocardial infarction)   Pulmonary edema   DM type 2 (diabetes mellitus, type 2)   HTN (hypertension)   Chronic systolic heart failure   Hyperlipidemia  Acute respiratory failure- Differentials include- ACS, Acute pulmonary edema, and Arrhythmia- considering prolonged Qtc- 531. Pt with elevated JVD, with expiratory wheezing- likely Cardiac wheeze, weight appears stable over the past year, chest xray suggestive of acute pulmonary edema, likely related to sudden elevated Bp , BNP- 296 but pt is obese, last ECHO- Ef- 45-50%, with mild hypokinesis. Considering Prior hx of significant CAD- ACS is a concern, but EKG and trops without concerning features. Doubt PE has chest pain not typical for PE, no risk factors, hypoxia responding to just O2 now, and considering JDV and chest xray finding of pulmonary edema. Pt says he was on his bed, denies falling on the floor or hitting his head, therfore deferred Ct head. - Trops x3 -  EKG Am - Cards recs appreciated - Echo  - Bmet am.  - Strict in and out - Daily weights - IV lasix- initially 40 daily ,  increase 36m BID- per cards - Repeat EKG- am and now( Artifacts in EKG) - T wave inversions- III, and v6. - Resume Home meds, D/c NTG infusion - Monitor on tele  CAD with CHF- systolic- Extensive past medical hx, per cardiology note pt had a cardiac arrest in 1997, in 2015- had new LBBB had atherectomy, cutting balloon angioplasty and DES to RCA, EF 2015 improved from 30-35% to 45-50%. LDL- 41. - Lipitor- 435mdaily - Corge and quinapril- cont, pt compliant - ECHO - Cont plavix and aspirin  HTN- Bp elevated in the ED. Was initially on NTG gtt - Cont home meds- Coreg- 12.5, Quinapril- 4046maily  Diabtes- home meds- Glipizide and metformin, will hold for now - HgbA1c - SSI-s  Dispo: Disposition is deferred at this time, awaiting improvement of current medical problems.   The patient does have a current PCP (ChMarny LowensteiniMacario CarlsMD) and [facility].  The patient does not have transportation limitations that hinder transportation to clinic appointments.  Signed: EjiBethena RoysD 09/08/2014, 11:33 AM

## 2014-09-08 NOTE — ED Notes (Signed)
Received pt via EMS with c/o called out for shortness of breath and diaphoresis. Upon arrival of EMS pt was unresponsive. Initial pulse ox for EMS was 40%, EMS did BVM and nasal trumpets which brought pulse ox to 80%. Pt began talking when EMS got to hospital. Pt given 4 MG of Zofran, 125 MG of solumedrol and neb treatment of albuterol and atrovent.

## 2014-09-08 NOTE — H&P (Signed)
Date: 09/08/2014               Patient Name:  Devin Hanna MRN: [id number]  DOB: 1950-08-30 Age / Sex: 64 y.o., male   PCP: [name]., MD         Medical Service: Internal Medicine Teaching Service, [place]         Attending Physician: Dr. Sid Falcon, MD    First Contact: Holley Raring, MS4 Pager: [phone]  Second Contact: Dr. Denton Brick Pager: [phone]       After Hours (After 5p/  First Contact Pager: [phone]  weekends / holidays): Second Contact Pager: [phone]   Chief Complaint:  SOB  History of Present Illness: [name] is a 64 y.o. male with a h/o of NYHA Class II Heart Failure, CAD, t2DM, HTN who presents with SOB and diaphoresis. He was found unresponsive by EMS with an initial pulse-ox of 50% that responded with Bi-Pap. Patient's EKG was non-specific with some possible ischemic ST depression in the lateral leads and CE were negative x2. Patient had a borderline BNP and mild lactic acidosis. CXR showed bilateral opacities suspicious for interstitial edema. He was stabilized in the ED and transferred to step-down for monitoring of respiratory status.  He reports that he was in his normal state of health until this morning when he got up to use the bathroom. Upon returning to his bed, he became acutely SOB and then described feeling chest tightness that was similar in quality to his MI last year. He is s/p MI w/ cath in Feb. 2015. He denies radiation of the pain and localizes it to the substernal region. He endorses a mild cough but denies any recent history of cough, fever, chills, and sweats. He endorses that his weight has been stable at home recently and he has some mild swelling in his BLE L>R (secondary to h/o trauma in RLE).  He reports that his home BP is well controlled and he takes his medications as prescribed. He denies any palpitations, or vomitting but does endorse some nausea. He denies any PND or orthopnea (but notes a 2 year h/o using 2  pillows for GERD sxs). Notes that he was previously recommended to use CPAP at night, but has not been compliant with this.  He denies any changes to his vision or weakness/numbness in his extremeties. He does endorse a recent prescription of gabapentin for LE neuropathy, and notes that he only takes this BID (Rx for TID) due to increased sleepiness.  Meds: Current Outpatient Rx  Name  Route  Sig  Dispense  Refill  . aspirin EC 81 MG EC tablet   Oral   Take 1 tablet (81 mg total) by mouth daily.   30 tablet   0   . atorvastatin (LIPITOR) 40 MG tablet   Oral   Take 1 tablet (40 mg total) by mouth daily.   90 tablet   3   . calcium carbonate (TUMS - DOSED IN MG ELEMENTAL CALCIUM) 500 MG chewable tablet   Oral   Chew 1 tablet by mouth as needed for indigestion or heartburn.         . carvedilol (COREG) 12.5 MG tablet   Oral   Take 1 tablet (12.5 mg total) by mouth 2 (two) times daily.   180 tablet   3   . Cinnamon 500 MG capsule   Oral   Take 1,000 mg by mouth 2 (two) times daily  with a meal.         . clopidogrel (PLAVIX) 75 MG tablet   Oral   Take 1 tablet (75 mg total) by mouth daily with breakfast.   30 tablet   3   . furosemide (LASIX) 20 MG tablet   Oral   Take 20 mg by mouth daily as needed (swelling).         . gabapentin (NEURONTIN) 100 MG capsule   Oral   Take 100 mg by mouth 3 (three) times daily.         Marland Kitchen glipiZIDE (GLUCOTROL XL) 10 MG 24 hr tablet   Oral   Take 10 mg by mouth daily with breakfast.          . metFORMIN (GLUCOPHAGE) 1000 MG tablet   Oral   Take 1,000 mg by mouth 2 (two) times daily with a meal.         . Multiple Vitamin (MULTIVITAMIN WITH MINERALS) TABS   Oral   Take 1 tablet by mouth daily.         Marland Kitchen omeprazole (PRILOSEC OTC) 20 MG tablet   Oral   Take 20 mg by mouth daily as needed (heartburn).         . quinapril (ACCUPRIL) 40 MG tablet   Oral   Take 1 tablet (40 mg total) by mouth daily.   30 tablet    3   . nitroGLYCERIN (NITROSTAT) 0.4 MG SL tablet   Sublingual   Place 0.4 mg under the tongue every 5 (five) minutes as needed for chest pain.           Allergies: Allergies as of 09/08/2014  . (No Known Allergies)   Past Medical History  Diagnosis Date  . MI (myocardial infarction)     "has not seen cardiologist in several years"  . Diabetes mellitus     sees Dr. Hardin Negus in [place]  . Hyperlipidemia   . Vertigo    Past Surgical History  Procedure Laterality Date  . Coronary stent placement    . External fixation leg Left 04/03/2012    Procedure: EXTERNAL FIXATION LEG;  Surgeon: Wylene Simmer, MD;  Location: [place];  Service: Orthopedics;  Laterality: Left;  . Irrigation and debridement knee Left 04/03/2012    Procedure: IRRIGATION AND DEBRIDEMENT KNEE;  Surgeon: Wylene Simmer, MD;  Location: [place];  Service: Orthopedics;  Laterality: Left;  . Orif tibia plateau Left 04/14/2012    Procedure: OPEN REDUCTION INTERNAL FIXATION (ORIF) TIBIAL PLATEAU;  Surgeon: Wylene Simmer, MD;  Location: [place];  Service: Orthopedics;  Laterality: Left;  . External fixation removal Left 04/14/2012    Procedure: REMOVAL EXTERNAL FIXATION LEG;  Surgeon: Wylene Simmer, MD;  Location: [place];  Service: Orthopedics;  Laterality: Left;  . Left heart catheterization with coronary angiogram N/A 04/05/2013    Procedure: LEFT HEART CATHETERIZATION WITH CORONARY ANGIOGRAM;  Surgeon: [name], MD;  Location: [place] CATH LAB;  Service: Cardiovascular;  Laterality: N/A;  . Percutaneous coronary rotoblator intervention (pci-r) N/A 04/06/2013    Procedure: PERCUTANEOUS CORONARY ROTOBLATOR INTERVENTION (PCI-R);  Surgeon: [name], MD;  Location: [facility] C[facility] LAB;  Service: Cardiovascular;  Laterality: N/A;   Family History  Problem Relation Age of Onset  . Coronary artery disease Brother   . Cancer Brother   . Coronary artery disease Father    History   Social History  . Marital Status: Single    Spouse Name:  N/A  . Number of  Children: N/A  . Years of Education: N/A   Occupational History  . heavy Company secretary    Social History Main Topics  . Smoking status: Former Smoker    Quit date: 04/05/1995  . Smokeless tobacco: Not on file  . Alcohol Use: No     Comment: Occassional Use  . Drug Use: No  . Sexual Activity: Not on file   Other Topics Concern  . Not on file   Social History Narrative   Heavy Company secretary.       Divorced    Review of Systems: All pertinent ROS recorded in the HPI. Otherwise, A comprehensive review of systems was negative.  Physical Exam: Blood pressure 124/69, pulse 73, temperature 99.1 F (37.3 C), temperature source Rectal, resp. rate 17, height 5' 7"  (1.702 m), weight 108.863 kg (240 lb), SpO2 93 %. BP 126/70 mmHg  Pulse 72  Temp(Src) 99.1 F (37.3 C) (Rectal)  Resp 20  Ht 5' 7"  (1.702 m)  Wt 108.863 kg (240 lb)  BMI 37.58 kg/m2  SpO2 91%   General Appearance:    Alert, cooperative, minimal distress sitting up in stretcher with [place] on, appears stated age  Head:    Normocephalic, without obvious abnormality, atraumatic  Eyes:    PERRL, conjunctiva/corneas clear, EOM's intact  Throat:   Lips, mucosa, and tongue normal; teeth and gums normal  Neck:   Symmetrical, trachea midline, mild JVD with hepatic reflex  Back:     Symmetric, no curvature, ROM normal, no CVA tenderness  Lungs:     Wearing [place], Coarse crackle throughout bilaterally, respirations unlabored  Chest wall:    No tenderness or deformity  Heart:    Regular rate and rhythm, S1 and S2 normal  Abdomen:     Obese, soft, non-tender, bowel sounds active all four quadrants, no masses, no organomegaly  Extremities:   Extremities normal, atraumatic, no cyanosis; Trace bilateral edema L>R  Pulses:   2+ and symmetric all extremities  Skin:   Skin color, texture, turgor normal, no rashes or lesions  Neurologic:   CNII-XII grossly intact. Normal strength, sensation and reflexes  throughout    Labs:  Basic Metabolic Panel:  Recent Labs Lab 09/08/14 0847  NA 137  K 5.1  CL 105  CO2 21*  GLUCOSE 276*  BUN 9  CREATININE 1.07  CALCIUM 8.4*    Liver Function Tests:  Recent Labs Lab 09/08/14 0847  AST 49*  ALT 37  ALKPHOS 162*  BILITOT 1.1  PROT 6.7  ALBUMIN 3.1*    CBC:  Recent Labs Lab 09/08/14 0847  WBC 8.8  NEUTROABS 6.0  HGB 14.9  HCT 44.9  MCV 91.4  PLT 225    Cardiac Enzymes:  Recent Labs Lab 09/08/14 0847  TROPONINI 0.03    BNP: Recent Labs     09/08/14  0847  BNP  296.0*    Other results: EKG: nonspecific ST and T waves changes, LBBB, mild ST depression in lateral leads.  Imaging: Dg Chest Port 1 View  09/08/2014   CLINICAL DATA:  Hypoxia and shortness of breath.  EXAM: PORTABLE CHEST - 1 VIEW  COMPARISON:  04/04/2013  FINDINGS: Cardiomegaly noted. Bilateral interstitial and airspace opacities likely represent moderate pulmonary edema.  There is no evidence of pneumothorax.  No large pleural effusions are identified.  No acute bony abnormalities are identified.  IMPRESSION: Bilateral interstitial and airspace opacities likely representing moderate pulmonary edema. Cardiomegaly again identified.   Electronically Signed   By:  Margarette Canada M.D.   On: 09/08/2014 09:11     Assessment & Plan by Problem: Active Problems:   Pulmonary edema   Mr. Albaro Deviney Winther is a 64 y.o. male with CHF (EF 45%), CAD, HTN, and t2DM, who presents for evaluation and management of SOB 2/2 pulmonary edema.  1) SOB: DDx includes CHF exacerbation vs hypertensive flash pulmonary edema vs PE. PE unlikely given lack of sx for DVT and no tachycardia. Infectious etiology low on differential given lack of fever and leukocytosis. BNP at 296 which is unable to r/o CHF. Takes Lasix at home, weight at home has been stable. BP elevated to 262M systolic on presentation. -- 105m IV Lasix for diuresis, consider 2nd dose if necessary -- O2 to sat >90%,  Bi-Pap if needed -- Cardiac monitoring -- Continue home ACE, BB, and statin -- DDimer to assess for PE -- Finish trop cycle with CE x1 -- Repeat CXR in the AM -- CBC/Bmet in the AM  2) CAD: c/o chest tightness. No recent chest pain/anginal symptoms. h/o MI with stenting. EKG w/ non-specific changes. Cardiology consulted, appreciate recs. -- Continue ASA and Plavix -- Nitro PRN CP  3) HTN: Stable -- Continue home meds as above  4) t2DM: BG elevated at 276. -- Hold home oral hypoglycemics. -- SSI -- Gabapentin 1055mTID  5) GERD: -- Continue home omeprazole -- TUMS PRN  FEN/GI: -- NPO currently will reassess and ADAT   DVT PPX - low molecular weight heparin  CODE STATUS - Full  CONSULTS PLACED - Cardiology  DISPO - Admit to Internal Medicine Teaching Service, [facility]  This is a [employer] information officerote.  The care of the patient was discussed with Dr. EmDenton Bricknd the assessment and plan was formulated with their assistance.  Please see their note for official documentation of the patient encounter.   Signed: BrEffie BerkshireMS4 Pager: [phone]/30/2016, 10:20 AM

## 2014-09-08 NOTE — Consult Note (Signed)
CARDIOLOGY CONSULT NOTE   Patient ID: Devin Hanna MRN: [id number], DOB/AGE: 09-29-1950   Admit date: 09/08/2014 Date of Consult: 09/08/2014  Primary Physician: [name], MD Primary Cardiologist: Dr Fletcher Anon  Reason for consult:  CHF  Problem List  Past Medical History  Diagnosis Date  . MI (myocardial infarction)     "has not seen cardiologist in several years"  . Diabetes mellitus     sees Dr. Hardin Negus in [place]  . Hyperlipidemia   . Vertigo     Past Surgical History  Procedure Laterality Date  . Coronary stent placement    . External fixation leg Left 04/03/2012    Procedure: EXTERNAL FIXATION LEG;  Surgeon: Wylene Simmer, MD;  Location: [place];  Service: Orthopedics;  Laterality: Left;  . Irrigation and debridement knee Left 04/03/2012    Procedure: IRRIGATION AND DEBRIDEMENT KNEE;  Surgeon: Wylene Simmer, MD;  Location: [place];  Service: Orthopedics;  Laterality: Left;  . Orif tibia plateau Left 04/14/2012    Procedure: OPEN REDUCTION INTERNAL FIXATION (ORIF) TIBIAL PLATEAU;  Surgeon: Wylene Simmer, MD;  Location: [place];  Service: Orthopedics;  Laterality: Left;  . External fixation removal Left 04/14/2012    Procedure: REMOVAL EXTERNAL FIXATION LEG;  Surgeon: Wylene Simmer, MD;  Location: [place];  Service: Orthopedics;  Laterality: Left;  . Left heart catheterization with coronary angiogram N/A 04/05/2013    Procedure: LEFT HEART CATHETERIZATION WITH CORONARY ANGIOGRAM;  Surgeon: [name], MD;  Location: [place] CATH LAB;  Service: Cardiovascular;  Laterality: N/A;  . Percutaneous coronary rotoblator intervention (pci-r) N/A 04/06/2013    Procedure: PERCUTANEOUS CORONARY ROTOBLATOR INTERVENTION (PCI-R);  Surgeon: [name], MD;  Location: [facility] CATH LAB;  Service: Cardiovascular;  Laterality: N/A;     Allergies  No Known Allergies  HPI   [name] is a 64 year old man followed by Dr Fletcher Anon in the office, with known CAD and chronic systolic CHF, ischemic  cardiomyopathy. He has known CAD with past RCA stents and prior MI, GERD, DM, HTN and HLD.  He presented in February of 2015 with sudden onset of SOB - required Lasix and Bipap - ruled in for MI -cardiac catheterization showed 99% in-stent restenosis in the right coronary artery which was heavily calcified. There was also 60-70% distal LAD stenosis. EF was 30-35% by echo. He did have left bundle branch block. RCA PCI was attempted and was initially unsuccessful due to inability to dilate the calcified lesion. A successful rotational atherectomy was performed, cutting balloon angioplasty and DES to the proximal RCA . Echocardiogram in 09/2013 showed EF of 45-50% with mild MR. During last visit, I increased the dose of Quinapril to 40 mg once daily. On the last visit with Dr Fletcher Anon on 06/07/14 he was doing very well and denied any chest pain,orthopnea or PND. He uses Lasix only as needed, he had stable mild  to moderate exertional dyspnea.   He woke well this am, then developed sudden onset respiratory distress. Patient called cousin and EMS for shortness of breath that started this morning. Patient denies any cough but states he is having some chest tightness and severe trouble breathing. When EMS arrived his oxygen saturation was under 50%. He was only responsive to pain at that time but still had a gag reflex. They bag-valve-mask ventilated him and assess watching him up into the 80s he became much more responsive and awake. Patient states he has had some leg swelling but overall denies any missed doses  of any medicines. Patient denies any fevers or feeling ill last night. Patient states this feels like prior CHF exacerbations. He was started on BiPAP in the ER with significant improvement, transferred to stepdown on O2 with nasal cannula only. Currently has mild chest tightness, no palpitations. His BP was in 190' on arrival to the ER.   Inpatient Medications  . aspirin EC  81 mg Oral Daily  .  atorvastatin  40 mg Oral Daily  . carvedilol  12.5 mg Oral BID  . [START ON 09/09/2014] clopidogrel  75 mg Oral Q breakfast  . enoxaparin (LOVENOX) injection  40 mg Subcutaneous Q24H  . furosemide  40 mg Intravenous Daily  . gabapentin  100 mg Oral TID  . insulin aspart  0-9 Units Subcutaneous TID WC  . quinapril  40 mg Oral Daily   Family History Family History  Problem Relation Age of Onset  . Coronary artery disease Brother   . Cancer Brother   . Coronary artery disease Father     Social History History   Social History  . Marital Status: Single    Spouse Name: N/A  . Number of Children: N/A  . Years of Education: N/A   Occupational History  . heavy Company secretary    Social History Main Topics  . Smoking status: Former Smoker    Quit date: 04/05/1995  . Smokeless tobacco: Not on file  . Alcohol Use: No     Comment: Occassional Use  . Drug Use: No  . Sexual Activity: Not on file   Other Topics Concern  . Not on file   Social History Narrative   Heavy Company secretary.       Divorced    Review of Systems  General:  No chills, fever, night sweats or weight changes.  Cardiovascular:  No chest pain, dyspnea on exertion, edema, orthopnea, palpitations, paroxysmal nocturnal dyspnea. Dermatological: No rash, lesions/masses Respiratory: No cough, dyspnea Urologic: No hematuria, dysuria Abdominal:   No nausea, vomiting, diarrhea, bright red blood per rectum, melena, or hematemesis Neurologic:  No visual changes, wkns, changes in mental status. All other systems reviewed and are otherwise negative except as noted above.  Physical Exam  Blood pressure 138/77, pulse 71, temperature 99.1 F (37.3 C), temperature source Rectal, resp. rate 20, height 5' 7"  (1.702 m), weight 240 lb (108.863 kg), SpO2 92 %.  General: Pleasant, NAD Psych: Normal affect. Neuro: Alert and oriented X 3. Moves all extremities spontaneously. HEENT: Normal  Neck: Supple without bruits,  + 10 cm JVD. Lungs:  Resp regular and unlabored, crackles bibasilary. Heart: RRR no s3, s4, or murmurs. Abdomen: Soft, non-tender, non-distended, BS + x 4.  Extremities: No clubbing, cyanosis, mild LE edema. DP/PT/Radials 2+ and equal bilaterally.  Labs  Recent Labs  09/08/14 0847  TROPONINI 0.03   Lab Results  Component Value Date   WBC 8.8 09/08/2014   HGB 14.9 09/08/2014   HCT 44.9 09/08/2014   MCV 91.4 09/08/2014   PLT 225 09/08/2014    Recent Labs Lab 09/08/14 0847  NA 137  K 5.1  CL 105  CO2 21*  BUN 9  CREATININE 1.07  CALCIUM 8.4*  PROT 6.7  BILITOT 1.1  ALKPHOS 162*  ALT 37  AST 49*  GLUCOSE 276*   Lab Results  Component Value Date   CHOL 103 06/07/2014   HDL 38* 06/07/2014   LDLCALC 41 06/07/2014   TRIG 121 06/07/2014   Radiology/Studies  Dg Chest Port 1 [address]  09/08/2014   CLINICAL DATA:  Hypoxia and shortness of breath.  EXAM: PORTABLE CHEST - 1 VIEW  COMPARISON:  04/04/2013  FINDINGS: Cardiomegaly noted. Bilateral interstitial and airspace opacities likely represent moderate pulmonary edema.  There is no evidence of pneumothorax.  No large pleural effusions are identified.  No acute bony abnormalities are identified.  IMPRESSION: Bilateral interstitial and airspace opacities likely representing moderate pulmonary edema. Cardiomegaly again identified.   Electronically Signed   By: Margarette Canada M.D.   On: 09/08/2014 09:11   Echocardiogram - 10/05/2013 Left ventricle: The cavity size was mildly dilated. Systolic function was mildly reduced. The estimated ejection fraction was in the range of 45% to 50%. Hypokinesis of the inferior myocardium. Hypokinesis of the inferoseptal myocardium. Left ventricular diastolic function parameters were normal. - Mitral valve: There was mild regurgitation. - Left atrium: The atrium was mildly dilated. - Right atrium: The atrium was mildly dilated.  ECG: SR, LBBB, unchanged from 06/07/2014     ASSESSMENT  AND PLAN  1. Respiratory failure - with significant hypoxia on admission, improved with BiPAP, unknown trigger - ? ACS, hypertensive urgency, we will continue evaluating for ACS and treat for acute on chronic systolic CHF.  2. Acute on chronic systolic CHF - I would increase lasix to 40 mg iv BID, repeat echo to reevaluate systolic and diastolic function  3. CAD, s/p RCA stenting, today chest tightness, 1. Troponin negative, we will continue to monitor, repeat echocardiogram for WMA  4. Hypertension - continue home meds, added lasix, we will monitor, it is possible that hypertension on arrival was sec to acute distress  5. Hyperlipidemia - on atorvastatin   Signed, Dorothy Spark, MD, [facility] 09/08/2014, 1:08 PM

## 2014-09-08 NOTE — ED Provider Notes (Signed)
CSN: [id number]     Arrival date & time 09/08/14  6808 History   First MD Initiated Contact with Patient 09/08/14 [phone]     Chief Complaint  Patient presents with  . Shortness of Breath  . unresponsive      (Consider location/radiation/quality/duration/timing/severity/associated sxs/prior Treatment) HPI  64 year old male presents with acute hypoxia and respiratory distress. Patient called cousin and EMS for shortness of breath that started this morning. Patient denies any cough but states he is having some chest tightness and severe trouble breathing. When EMS arrived his oxygen saturation was under 50%. He was only responsive to pain at that time but still had a gag reflex. They bag-valve-mask ventilated him and assess watching him up into the 80s he became much more responsive and awake. Patient states he has had some leg swelling but overall denies any missed doses of any medicines. Patient denies any fevers or feeling ill last night. Patient states this feels like prior CHF exacerbations.  Past Medical History  Diagnosis Date  . MI (myocardial infarction)     "has not seen cardiologist in several years"  . Diabetes mellitus     sees Dr. Hardin Negus in [place]  . Hyperlipidemia   . Vertigo    Past Surgical History  Procedure Laterality Date  . Coronary stent placement    . External fixation leg Left 04/03/2012    Procedure: EXTERNAL FIXATION LEG;  Surgeon: Wylene Simmer, MD;  Location: [place];  Service: Orthopedics;  Laterality: Left;  . Irrigation and debridement knee Left 04/03/2012    Procedure: IRRIGATION AND DEBRIDEMENT KNEE;  Surgeon: Wylene Simmer, MD;  Location: [place];  Service: Orthopedics;  Laterality: Left;  . Orif tibia plateau Left 04/14/2012    Procedure: OPEN REDUCTION INTERNAL FIXATION (ORIF) TIBIAL PLATEAU;  Surgeon: Wylene Simmer, MD;  Location: [place];  Service: Orthopedics;  Laterality: Left;  . External fixation removal Left 04/14/2012    Procedure: REMOVAL EXTERNAL  FIXATION LEG;  Surgeon: Wylene Simmer, MD;  Location: [place];  Service: Orthopedics;  Laterality: Left;  . Left heart catheterization with coronary angiogram N/A 04/05/2013    Procedure: LEFT HEART CATHETERIZATION WITH CORONARY ANGIOGRAM;  Surgeon: [name], MD;  Location: [place] CATH LAB;  Service: Cardiovascular;  Laterality: N/A;  . Percutaneous coronary rotoblator intervention (pci-r) N/A 04/06/2013    Procedure: PERCUTANEOUS CORONARY ROTOBLATOR INTERVENTION (PCI-R);  Surgeon: [name], MD;  Location: [facility] CATH LAB;  Service: Cardiovascular;  Laterality: N/A;   Family History  Problem Relation Age of Onset  . Coronary artery disease Brother   . Cancer Brother   . Coronary artery disease Father    History  Substance Use Topics  . Smoking status: Former Smoker    Quit date: 04/05/1995  . Smokeless tobacco: Not on file  . Alcohol Use: No     Comment: Occassional Use    Review of Systems  Unable to perform ROS: Severe respiratory distress      Allergies  Review of patient's allergies indicates no known allergies.  Home Medications   Prior to Admission medications   Medication Sig Start Date End Date Taking? Authorizing Provider  aspirin EC 81 MG EC tablet Take 1 tablet (81 mg total) by mouth daily. 04/08/13   Debbe Odea, MD  atorvastatin (LIPITOR) 40 MG tablet Take 1 tablet (40 mg total) by mouth daily. 02/07/14   [name], MD  carvedilol (COREG) 12.5 MG tablet Take 1 tablet (12.5 mg total) by mouth 2 (two)  times daily. 06/12/14   [name], MD  Cinnamon 500 MG capsule Take 1,000 mg by mouth 2 (two) times daily with a meal.    Historical Provider, MD  clopidogrel (PLAVIX) 75 MG tablet Take 1 tablet (75 mg total) by mouth daily with breakfast. 08/14/14   [name], MD  glipiZIDE (GLUCOTROL XL) 10 MG 24 hr tablet Take 10 mg by mouth daily with breakfast.     Historical Provider, MD  metFORMIN (GLUCOPHAGE) 1000 MG tablet Take 1,000 mg by mouth 2 (two)  times daily with a meal.    Historical Provider, MD  Multiple Vitamin (MULTIVITAMIN WITH MINERALS) TABS Take 1 tablet by mouth daily.    Historical Provider, MD  nitroGLYCERIN (NITROSTAT) 0.4 MG SL tablet Place 0.4 mg under the tongue every 5 (five) minutes as needed for chest pain.    Historical Provider, MD  omeprazole (PRILOSEC OTC) 20 MG tablet Take 20 mg by mouth daily as needed (heartburn).    Historical Provider, MD  quinapril (ACCUPRIL) 40 MG tablet Take 1 tablet (40 mg total) by mouth daily. 06/11/14   [name], MD   BP 130/64 mmHg  Pulse 70  Temp(Src) 98.2 F (36.8 C) (Oral)  Resp 20  Ht 5' 7"  (1.702 m)  Wt 240 lb (108.863 kg)  BMI 37.58 kg/m2  SpO2 99% Physical Exam  Constitutional: He is oriented to person, place, and time. He appears well-developed and well-nourished.  HENT:  Head: Normocephalic and atraumatic.  Right Ear: External ear normal.  Left Ear: External ear normal.  Nose: Nose normal.  Eyes: Right eye exhibits no discharge. Left eye exhibits no discharge.  Neck: Neck supple.  Cardiovascular: Normal rate, regular rhythm, normal heart sounds and intact distal pulses.   Pulmonary/Chest: Accessory muscle usage present. Tachypnea noted. He is in respiratory distress. He has rales (diffusely).  Abdominal: Soft. There is no tenderness.  Musculoskeletal: He exhibits edema (mild pitting edema to ankles).  Neurological: He is alert and oriented to person, place, and time.  Skin: Skin is warm and dry.  Nursing note and vitals reviewed.   ED Course  Procedures (including critical care time) Labs Review Labs Reviewed  COMPREHENSIVE METABOLIC PANEL - Abnormal; Notable for the following:    CO2 21 (*)    Glucose, Bld 276 (*)    Calcium 8.4 (*)    Albumin 3.1 (*)    AST 49 (*)    Alkaline Phosphatase 162 (*)    All other components within normal limits  BRAIN NATRIURETIC PEPTIDE - Abnormal; Notable for the following:    B Natriuretic Peptide 296.0 (*)     All other components within normal limits  GLUCOSE, CAPILLARY - Abnormal; Notable for the following:    Glucose-Capillary 234 (*)    All other components within normal limits  GLUCOSE, CAPILLARY - Abnormal; Notable for the following:    Glucose-Capillary 294 (*)    All other components within normal limits  I-STAT ARTERIAL BLOOD GAS, ED - Abnormal; Notable for the following:    pH, Arterial 7.293 (*)    Acid-base deficit 6.0 (*)    All other components within normal limits  I-STAT CG4 LACTIC ACID, ED - Abnormal; Notable for the following:    Lactic Acid, Venous 4.71 (*)    All other components within normal limits  MRSA PCR SCREENING  CBC WITH DIFFERENTIAL/PLATELET  TROPONIN I  BASIC METABOLIC PANEL  CBC  I-STAT TROPOININ, ED    Imaging Review Dg  Chest Port 1 View  09/08/2014   CLINICAL DATA:  Hypoxia and shortness of breath.  EXAM: PORTABLE CHEST - 1 VIEW  COMPARISON:  04/04/2013  FINDINGS: Cardiomegaly noted. Bilateral interstitial and airspace opacities likely represent moderate pulmonary edema.  There is no evidence of pneumothorax.  No large pleural effusions are identified.  No acute bony abnormalities are identified.  IMPRESSION: Bilateral interstitial and airspace opacities likely representing moderate pulmonary edema. Cardiomegaly again identified.   Electronically Signed   By: Margarette Canada M.D.   On: 09/08/2014 09:11     EKG Interpretation   Date/Time:  Saturday September 08 2014 08:26:43 EDT Ventricular Rate:  90 PR Interval:  97 QRS Duration: 157 QT Interval:  434 QTC Calculation: 531 R Axis:   111 Text Interpretation:  Left bundle branch block Short PR interval  Anteroseptal infarct, old Borderline ST depression, lateral leads no  significant change since Feb 2015 Confirmed by Pamlea Finder  MD, Tynasia Mccaul ([id number])  on 09/08/2014 8:32:35 AM      CRITICAL CARE Performed by: Sherwood Gambler T   Total critical care time: 30 minutes  Critical care time was exclusive of  separately billable procedures and treating other patients.  Critical care was necessary to treat or prevent imminent or life-threatening deterioration.  Critical care was time spent personally by me on the following activities: development of treatment plan with patient and/or surrogate as well as nursing, discussions with consultants, evaluation of patient's response to treatment, examination of patient, obtaining history from patient or surrogate, ordering and performing treatments and interventions, ordering and review of laboratory studies, ordering and review of radiographic studies, pulse oximetry and re-evaluation of patient's condition.  MDM   Final diagnoses:  Acute pulmonary edema    Patient with acute hypoxia and initial unresponsiveness. When O2 corrected he regained consciousness and now is appropriate. Appears to have acute pulmonary edema, likely from hypertensive emergency. Started on Bipap and IV nitro and responded appropriately. No apparent MI as cause. Able to wean off drip and get off BiPAP. Needing supplemental O2, will need admission with supportive care, echo, etc. Cards consulted, requesting medicine admit. To Stepdown.     Sherwood Gambler, MD 09/08/14 [phone]

## 2014-09-09 ENCOUNTER — Inpatient Hospital Stay (HOSPITAL_COMMUNITY): Payer: BLUE CROSS/BLUE SHIELD

## 2014-09-09 DIAGNOSIS — I509 Heart failure, unspecified: Secondary | ICD-10-CM

## 2014-09-09 DIAGNOSIS — J810 Acute pulmonary edema: Secondary | ICD-10-CM

## 2014-09-09 DIAGNOSIS — I5022 Chronic systolic (congestive) heart failure: Secondary | ICD-10-CM

## 2014-09-09 DIAGNOSIS — Z87440 Personal history of urinary (tract) infections: Secondary | ICD-10-CM

## 2014-09-09 DIAGNOSIS — J9601 Acute respiratory failure with hypoxia: Secondary | ICD-10-CM

## 2014-09-09 DIAGNOSIS — D72829 Elevated white blood cell count, unspecified: Secondary | ICD-10-CM

## 2014-09-09 DIAGNOSIS — E1165 Type 2 diabetes mellitus with hyperglycemia: Secondary | ICD-10-CM

## 2014-09-09 DIAGNOSIS — G4733 Obstructive sleep apnea (adult) (pediatric): Secondary | ICD-10-CM

## 2014-09-09 LAB — CBC
HCT: 40.1 % (ref 39.0–52.0)
Hemoglobin: 13.5 g/dL (ref 13.0–17.0)
MCH: 30.3 pg (ref 26.0–34.0)
MCHC: 33.7 g/dL (ref 30.0–36.0)
MCV: 89.9 fL (ref 78.0–100.0)
Platelets: 197 10*3/uL (ref 150–400)
RBC: 4.46 MIL/uL (ref 4.22–5.81)
RDW: 15.7 % — AB (ref 11.5–15.5)
WBC: 14.2 10*3/uL — AB (ref 4.0–10.5)

## 2014-09-09 LAB — COMPREHENSIVE METABOLIC PANEL
ALBUMIN: 3 g/dL — AB (ref 3.5–5.0)
ALK PHOS: 122 U/L (ref 38–126)
ALT: 38 U/L (ref 17–63)
AST: 52 U/L — AB (ref 15–41)
Anion gap: 14 (ref 5–15)
BUN: 20 mg/dL (ref 6–20)
CO2: 23 mmol/L (ref 22–32)
Calcium: 8.7 mg/dL — ABNORMAL LOW (ref 8.9–10.3)
Chloride: 100 mmol/L — ABNORMAL LOW (ref 101–111)
Creatinine, Ser: 1.01 mg/dL (ref 0.61–1.24)
GFR calc Af Amer: >60 mL/min (ref >60)
Glucose, Bld: 175 mg/dL — ABNORMAL HIGH (ref 65–99)
POTASSIUM: 4.4 mmol/L (ref 3.5–5.1)
Sodium: 137 mmol/L (ref 135–145)
Total Bilirubin: 1 mg/dL (ref 0.3–1.2)
Total Protein: 5.9 g/dL — ABNORMAL LOW (ref 6.5–8.1)

## 2014-09-09 LAB — URINE MICROSCOPIC-ADD ON

## 2014-09-09 LAB — URINALYSIS, ROUTINE W REFLEX MICROSCOPIC
BILIRUBIN URINE: NEGATIVE
GLUCOSE, UA: NEGATIVE mg/dL
HGB URINE DIPSTICK: NEGATIVE
KETONES UR: NEGATIVE mg/dL
Nitrite: POSITIVE — AB
PH: 5.5 (ref 5.0–8.0)
Protein, ur: NEGATIVE mg/dL
SPECIFIC GRAVITY, URINE: 1.017 (ref 1.005–1.030)
Urobilinogen, UA: 1 mg/dL (ref 0.0–1.0)

## 2014-09-09 LAB — RAPID URINE DRUG SCREEN, HOSP PERFORMED
Amphetamines: NOT DETECTED
BARBITURATES: NOT DETECTED
Benzodiazepines: NOT DETECTED
Cocaine: NOT DETECTED
Opiates: NOT DETECTED
TETRAHYDROCANNABINOL: NOT DETECTED

## 2014-09-09 LAB — MAGNESIUM: MAGNESIUM: 2 mg/dL (ref 1.7–2.4)

## 2014-09-09 LAB — TROPONIN I
TROPONIN I: 0.32 ng/mL — AB (ref <0.031)
Troponin I: 0.27 ng/mL — ABNORMAL HIGH (ref <0.031)

## 2014-09-09 LAB — LACTIC ACID, PLASMA
Lactic Acid, Venous: 2.7 mmol/L (ref 0.5–2.0)
Lactic Acid, Venous: 2.7 mmol/L (ref 0.5–2.0)

## 2014-09-09 LAB — PHOSPHORUS: PHOSPHORUS: 3.1 mg/dL (ref 2.5–4.6)

## 2014-09-09 LAB — TSH: TSH: 0.889 u[IU]/mL (ref 0.350–4.500)

## 2014-09-09 LAB — HIV ANTIBODY (ROUTINE TESTING W REFLEX): HIV SCREEN 4TH GENERATION: NONREACTIVE

## 2014-09-09 LAB — GLUCOSE, CAPILLARY
GLUCOSE-CAPILLARY: 172 mg/dL — AB (ref 65–99)
GLUCOSE-CAPILLARY: 203 mg/dL — AB (ref 65–99)

## 2014-09-09 MED ORDER — FUROSEMIDE 10 MG/ML IJ SOLN
40.0000 mg | Freq: Two times a day (BID) | INTRAMUSCULAR | Status: AC
  Administered 2014-09-09: 40 mg via INTRAVENOUS

## 2014-09-09 MED ORDER — PERFLUTREN LIPID MICROSPHERE
1.0000 mL | INTRAVENOUS | Status: AC | PRN
  Administered 2014-09-09: 2 mL via INTRAVENOUS
  Filled 2014-09-09: qty 10

## 2014-09-09 MED ORDER — GLIPIZIDE ER 10 MG PO TB24
10.0000 mg | ORAL_TABLET | Freq: Every day | ORAL | Status: DC
  Administered 2014-09-10 – 2014-09-12 (×3): 10 mg via ORAL
  Filled 2014-09-09 (×5): qty 1

## 2014-09-09 MED ORDER — FUROSEMIDE 40 MG PO TABS
40.0000 mg | ORAL_TABLET | Freq: Two times a day (BID) | ORAL | Status: DC
  Filled 2014-09-09 (×3): qty 1

## 2014-09-09 MED ORDER — PANTOPRAZOLE SODIUM 40 MG PO TBEC
40.0000 mg | DELAYED_RELEASE_TABLET | Freq: Once | ORAL | Status: AC
  Administered 2014-09-09: 40 mg via ORAL
  Filled 2014-09-09: qty 1

## 2014-09-09 MED ORDER — GABAPENTIN 300 MG PO CAPS
300.0000 mg | ORAL_CAPSULE | Freq: Three times a day (TID) | ORAL | Status: DC
  Administered 2014-09-09 – 2014-09-10 (×3): 300 mg via ORAL
  Filled 2014-09-09 (×4): qty 1

## 2014-09-09 NOTE — Progress Notes (Signed)
Utilization Review Completed.Donne Anon T7/31/2016

## 2014-09-09 NOTE — Progress Notes (Signed)
  Echocardiogram 2D Echocardiogram with Definity has been performed.  Diamond Nickel 09/09/2014, 3:27 PM

## 2014-09-09 NOTE — Progress Notes (Signed)
Subjective:    Currently, the patient is feeling well. He denies any episodes of SOB or CP/tightness O/N, and endorses that his dry cough is improved this morning. He continues to utilize 2L [place], but has not labored breathing. He states that he only takes his home lasix PRN leg swelling and denies having used this in the last several day PTA.  Interval Events: Troponin x 2 positive O/N: 0.34 --> 0.32 EKG neg for ischemic changes, shows improvement in lat lead ST depression from admission CXR demonstrates improvement of interstitial edema from admission Leukocytosis this AM @ 14.2 (8.8 on admission)   Objective:    Vital Signs:   Temp:  [97.4 F (36.3 C)-98.3 F (36.8 C)] 98.3 F (36.8 C) (07/31 0800) Pulse Rate:  [59-74] 59 (07/31 0800) Resp:  [13-22] 13 (07/31 0800) BP: (125-145)/(59-77) 139/65 mmHg (07/31 0800) SpO2:  [91 %-99 %] 99 % (07/31 0800) Weight:  [107.8 kg (237 lb 10.5 oz)] 107.8 kg (237 lb 10.5 oz) (07/31 0345)    24-hour weight change: Weight change:  -1kg  Intake/Output:   Intake/Output Summary (Last 24 hours) at 09/09/14 1042 Last data filed at 09/09/14 0522  Gross per 24 hour  Intake      0 ml  Output    875 ml  Net   -875 ml      Physical Exam: General: Vital signs reviewed and noted. Well-developed, well-nourished, in no acute distress; alert, appropriate and cooperative throughout examination.  Lungs:  Normal respiratory effort. Few coarse crackles bil throughout, improved from previous, no wheeze.  Heart: ~10cm JVD with hepatojugular reflex. RRR. S1 and S2 normal without gallop, murmur, or rubs.  Abdomen:  BS normoactive. Soft, Nondistended, non-tender.  No masses or organomegaly. No CVA tenderness.  Extremities: No pretibial edema improved from previous     Labs:  Basic Metabolic Panel:  Recent Labs Lab 09/08/14 0847  NA 137  K 5.1  CL 105  CO2 21*  GLUCOSE 276*  BUN 9  CREATININE 1.07  CALCIUM 8.4*    Liver Function  Tests:  Recent Labs Lab 09/08/14 0847  AST 49*  ALT 37  ALKPHOS 162*  BILITOT 1.1  PROT 6.7  ALBUMIN 3.1*   No results for input(s): LIPASE, AMYLASE in the last 168 hours. No results for input(s): AMMONIA in the last 168 hours.  CBC:  Recent Labs Lab 09/08/14 0847 09/09/14 0305  WBC 8.8 14.2*  NEUTROABS 6.0  --   HGB 14.9 13.5  HCT 44.9 40.1  MCV 91.4 89.9  PLT 225 197    Cardiac Enzymes:  Recent Labs Lab 09/08/14 0847 09/08/14 2054 09/09/14 0305  TROPONINI 0.03 0.34* 0.32*   BNP" Recent Labs     09/08/14  0847  BNP  296.0*     CBG:  Recent Labs Lab 09/08/14 1236 09/08/14 1642 09/08/14 1954  GLUCAP 59* 294* 91*    Microbiology: Results for orders placed or performed during the hospital encounter of 09/08/14  MRSA PCR Screening     Status: None   Collection Time: 09/08/14 12:33 PM  Result Value Ref Range Status   MRSA by PCR NEGATIVE NEGATIVE Final    Comment:        The GeneXpert MRSA Assay (FDA approved for NASAL specimens only), is one component of a comprehensive MRSA colonization surveillance program. It is not intended to diagnose MRSA infection nor to guide or monitor treatment for MRSA infections.     Other results: EKG: normal sinus  rhythm, LBBB, resolution of lateral lead ST depression.  Imaging: Dg Chest 2 View  09/09/2014   CLINICAL DATA:  Dyspnea  EXAM: CHEST  2 VIEW  COMPARISON:  Radiographs 09/08/2014  FINDINGS: Normal cardiac silhouette. There is chronic bronchitic markings. There is interval improvement in interstitial edema pattern seen on comparison exam. No pneumothorax. No focal infiltrate.  IMPRESSION: Improvement in interstitial edema pattern.   Electronically Signed   By: Suzy Bouchard M.D.   On: 09/09/2014 09:01   Dg Chest Port 1 View  09/08/2014   CLINICAL DATA:  Hypoxia and shortness of breath.  EXAM: PORTABLE CHEST - 1 VIEW  COMPARISON:  04/04/2013  FINDINGS: Cardiomegaly noted. Bilateral interstitial  and airspace opacities likely represent moderate pulmonary edema.  There is no evidence of pneumothorax.  No large pleural effusions are identified.  No acute bony abnormalities are identified.  IMPRESSION: Bilateral interstitial and airspace opacities likely representing moderate pulmonary edema. Cardiomegaly again identified.   Electronically Signed   By: Margarette Canada M.D.   On: 09/08/2014 09:11       Medications:    Infusions:    Scheduled Medications: . aspirin EC  81 mg Oral Daily  . atorvastatin  40 mg Oral Daily  . carvedilol  12.5 mg Oral BID  . clopidogrel  75 mg Oral Q breakfast  . enoxaparin (LOVENOX) injection  40 mg Subcutaneous Q24H  . furosemide  40 mg Intravenous BID  . gabapentin  100 mg Oral TID  . insulin aspart  0-9 Units Subcutaneous TID WC  . quinapril  40 mg Oral Daily    PRN Medications: calcium carbonate, nitroGLYCERIN   Assessment/ Plan:    Pt is a 64 y.o. yo male with a PMHx of CHF (EF 45% Aug. 2015), CAD (s/p MI Feb. 2015), HTN, and t2DM who was admitted on 09/08/2014 with symptoms of respiratory distress, which was determined to be secondary to pulmonary edema. Interventions at this time will be focused on diuresis and r/o of ACS.   1) SOB 2/2 pulmonary edema: Resolving with O2 and diuresis. CXR shows resolving edema. Trop leak O/N with low peak at 0.34. EKG negative for ischemic changes. DDx includes CHF exacerbation vs hypertensive urgency vs ACS vs acute valvular disease vs arrhythmia. PE unlikely given lack of sx for DVT and no tachycardia ("unlikely" by Wells score). Infectious etiology low given lack of fever and negative CXR. BNP unhelpful to r/o CHF exacerbation at 296. Takes po Lasix as needed at home, weights recently stable. BP elevated to 329J systolic on presentation. -- Cardiology consulted appreciate recs:  - Echo to assess EF, wall motion, and valves  - Diuresis -- Continue 29m IV Lasix BID for diuresis -- O2 to sat >90%, will attempt to  wean later today -- Continue home ACE, BB, and statin -- Continue to cycle trops x1. -- UDS -- OOB today as tolerated, PT eval and treat -- Cardiac monitoring, strict I/Os, daily wts  2) CAD: c/o chest tightness. No recent chest pain/anginal symptoms. h/o MI with stenting. Trop leak O/N. Today EKG w/ non-specific changes, resolved ST depression lateral leads. Cardiology consulted, appreciate recs as above. -- Continue ASA and Plavix -- Nitro PRN CP  3) Leukocytosis 2/2 UTI: WBC 14.2 (8.8 on admission). Pulmonary/Abd source unlikely given lack of infiltrates on CXR and benign exam. Consider urinary source. No history of recent steroid use. -- U/A positive for Nitrite and Leuks with many bacteria -- Start abx therapy for uncomplicated UTI  --  Ciprofloxacin PO 259m BID x3d  4) HTN: Stable -- Continue home meds as above  5) t2DM: BG elevated at 276. -- Hold home oral hypoglycemics. -- SSI -- Gabapentin 1086mTID for neuropathy  6) GERD: -- Continue home omeprazole -- TUMS PRN  FEN/GI: -- Heart healthy diet -- No IVF -- Strict I/Os   DVT PPX - low molecular weight heparin  CODE STATUS - Full  CONSULTS PLACED - Cards  DISPO - Disposition is deferred at this time, awaiting improvement of pulmonary edema and respiratory status, and findings from cardiac studies.   Anticipated discharge in approximately 2 day(s).   The patient does have a current PCP (PHILLIPS JR, CHMarny LowensteinMD) and [facility].    [facility] a one-time only appointment? no.  Does the patient have transportation limitations that hinder transportation to clinic appointments? no   [place]       Y = Yes, Blank = No PT:   OT:   RN:   Equipment:   Other:     Length of Stay: 1 day(s)   This is a MeCareers information officerote.  The care of the patient was discussed  with Dr. RaNaaman Plummernd the assessment and plan formulated with their assistance.  Please see their attached note for official documentation of the daily encounter.  BrEffie BerkshireMS4 Pager: [phone]-5PM) 09/09/2014, 10:42 AM

## 2014-09-09 NOTE — Progress Notes (Signed)
Pt refuses CPAP at this time. He understands reason for use and refuses. He also understands to call RN/RT if he changes his mind.

## 2014-09-09 NOTE — Progress Notes (Signed)
Discussed with patient the need for Cpap at night for sleep apnea. His last sleep study was in 1999. He states "I tried cpap for a month and could not sleep with it". I told him if he changed his mind a cpap machine for him to try is available.

## 2014-09-09 NOTE — Progress Notes (Signed)
CRITICAL VALUE ALERT  Critical value received:  Lactic acid 2.7  Date of notification:  09/09/2014  Time of notification:  0451  Critical value read back:Yes.    Nurse who received alert:  Starla Link, RN  MD notified (1st page):  IMTS  Time of first page:  [phone]  MD notified (2nd page):  Time of second page:  Responding MD:  IMTS  Time MD responded:  [phone]

## 2014-09-09 NOTE — Progress Notes (Signed)
Patient ID: Devin Hanna, male   DOB: 11/04/1950, 63 y.o.   MRN: [id number]    Subjective:  No chest pain  Dyspnea better   Objective:  Filed Vitals:   09/08/14 2226 09/08/14 2343 09/09/14 0345 09/09/14 0800  BP: 131/59 135/59 125/74 139/65  Pulse: 67 64 61 59  Temp:  97.4 F (36.3 C) 97.6 F (36.4 C) 98.3 F (36.8 C)  TempSrc:  Oral Oral Oral  Resp:  19 18 13   Height:      Weight:   107.8 kg (237 lb 10.5 oz)   SpO2:  98% 98% 99%    Intake/Output from previous day:  Intake/Output Summary (Last 24 hours) at 09/09/14 1213 Last data filed at 09/09/14 0522  Gross per 24 hour  Intake      0 ml  Output    875 ml  Net   -875 ml    Physical Exam: Affect appropriate Obese white male  HEENT: normal Neck supple with no adenopathy JVP normal no bruits no thyromegaly Lungs clear with no wheezing and good diaphragmatic motion Heart:  S1/S2 no murmur, no rub, gallop or click PMI normal Abdomen: benighn, BS positve, no tenderness, no AAA no bruit.  No HSM or HJR Distal pulses intact with no bruits Plus one bilateral  edema Neuro non-focal Skin warm and dry No muscular weakness   Lab Results: Basic Metabolic Panel:  Recent Labs  09/08/14 0847 09/09/14 0853  NA 137 137  K 5.1 4.4  CL 105 100*  CO2 21* 23  GLUCOSE 276* 175*  BUN 9 20  CREATININE 1.07 1.01  CALCIUM 8.4* 8.7*  MG  --  2.0  PHOS  --  3.1   Liver Function Tests:  Recent Labs  09/08/14 0847 09/09/14 0853  AST 49* 52*  ALT 37 38  ALKPHOS 162* 122  BILITOT 1.1 1.0  PROT 6.7 5.9*  ALBUMIN 3.1* 3.0*   CBC:  Recent Labs  09/08/14 0847 09/09/14 0305  WBC 8.8 14.2*  NEUTROABS 6.0  --   HGB 14.9 13.5  HCT 44.9 40.1  MCV 91.4 89.9  PLT 225 197   Cardiac Enzymes:  Recent Labs  09/08/14 2054 09/09/14 0305 09/09/14 0853  TROPONINI 0.34* 0.32* 0.27*   Thyroid Function Tests:  Recent Labs  09/09/14 0305  TSH 0.889    Imaging: Dg Chest 2 View  09/09/2014   CLINICAL DATA:   Dyspnea  EXAM: CHEST  2 VIEW  COMPARISON:  Radiographs 09/08/2014  FINDINGS: Normal cardiac silhouette. There is chronic bronchitic markings. There is interval improvement in interstitial edema pattern seen on comparison exam. No pneumothorax. No focal infiltrate.  IMPRESSION: Improvement in interstitial edema pattern.   Electronically Signed   By: Suzy Bouchard M.D.   On: 09/09/2014 09:01   Dg Chest Port 1 View  09/08/2014   CLINICAL DATA:  Hypoxia and shortness of breath.  EXAM: PORTABLE CHEST - 1 VIEW  COMPARISON:  04/04/2013  FINDINGS: Cardiomegaly noted. Bilateral interstitial and airspace opacities likely represent moderate pulmonary edema.  There is no evidence of pneumothorax.  No large pleural effusions are identified.  No acute bony abnormalities are identified.  IMPRESSION: Bilateral interstitial and airspace opacities likely representing moderate pulmonary edema. Cardiomegaly again identified.   Electronically Signed   By: Margarette Canada M.D.   On: 09/08/2014 09:11    Cardiac Studies:  ECG:    Telemetry:  NSR  No arrhythmia    Echo:   Medications:   .  aspirin EC  81 mg Oral Daily  . atorvastatin  40 mg Oral Daily  . carvedilol  12.5 mg Oral BID  . clopidogrel  75 mg Oral Q breakfast  . enoxaparin (LOVENOX) injection  40 mg Subcutaneous Q24H  . furosemide  40 mg Intravenous BID  . gabapentin  100 mg Oral TID  . insulin aspart  0-9 Units Subcutaneous TID WC  . quinapril  40 mg Oral Daily       Assessment/Plan:  CHF:  Improved.  Known ischemic DCM.  He has no chest pain and troponins not really elevated.  Last intervention to RCA in 05/15/34 Was very complicated recovering two procedures and finally a rotoblator.  Would continue medical Rx and not proceed with cath  Unless he gets chest pain , troponins go higher or echo shows marked change in EF  Change to PO lasix in am.  Continue ACE  Jenkins Rouge 09/09/2014, 12:13 PM

## 2014-09-09 NOTE — Progress Notes (Addendum)
  Date: 09/09/2014  Patient name: Devin Hanna  Medical record number: [id number]  Date of birth: 1950/09/04   I have seen and evaluated Gordon and discussed their care with the Residency Team.  Briefly, Mr. Dorris is a 64yo man with PMH of chronic systolic CHF, CAD, DM2, HTN who presented with SOB.  He reports that he was in his normal state of health until yesterday morning after returning from the bathroom he became acutely short of breath.  He also described chest tightness.  He was worried he was having an MI so he called his nephew and EMS.  When EMS responded, he was found unresponsive with a pOx of 50%.  He was placed on positive pressure ventilation which helped in the ED.  He describes the chest pain as substernal, no radiation.  He takes on prn lasix and has not taken it in the last few days.  His daughter manages his medications and she noted in the ED that he was taking everything prescribed.  He reports well controlled BP at home, no palpitations, no PND or orthopnea.  No change in vision, dysarthria, weakness.  He is a former smoker.   In the ED, He was treated with BIPAP and improved.  He was weaned down to [place] oxygen and he now feels better.  A CXR done at the time showed interstitial edema.  His blood pressure at the time of admission was 198/94.  Initial troponin was 0.03 and LA was 4.71.  EKG showed som nonspecific changes and concern for some ST depression in the lateral leads.  Cardiology was consulted.   On exam today, Mr. Funari is tolerating nasal cannula well, he is alert and oriented without any distress.  PERRLA, normal extraocular movements, He has apparent JVD which measures at 8cm today, neck is supple, RR, no extra beats, no murmurs, + crackles to mid lung, no wheezing; abdomen is obese, but not distended, soft.  He has some mild edema at ankles, LE are warm and pulses are intact in DP bilaterally.    Assessment and Plan: I have seen and evaluated the patient as  outlined above. I agree with the formulated Assessment and Plan as detailed in the residents' admission note, with the following changes:   1. Acute respiratory failure due to flash pulmonary edema - DDx includes acute hypertensive emergency, ACS/MI, valvular disorder, arrhythmia.  He has been admitted for diuresis and is much improved today - Troponin X 3 peaked at 0.34 but stabilized, will trend to evaluate for downtrend - AM EKG - Cardiology will follow along with [place] - TTE to update - I/O's and daily weight - IV lasix 62m BID for now - Resume home BP medications - Telemetry  2. CAD with sCHF - Continue home meds of statin, coreg, quinipril - Continue plavix and aspirin  Other issues per Dr. ETalmadge CoventryH&P.   ESid Falcon MD 7/31/201611:25 AM

## 2014-09-09 NOTE — Progress Notes (Addendum)
CRITICAL VALUE ALERT  Critical value received:  Lactic acid 2.7  Date of notification:  09/09/2014   Time of notification:  12:51 PM    Critical value read back:yes  Nurse who received alert:  tata Lilia Pro  MD notified (1st page):  Resident on call [phone]  Time of first page:  12:51 PM   MD notified (2nd page):  Time of second page:  Responding MD:    Time MD responded:

## 2014-09-09 NOTE — Progress Notes (Signed)
Subjective:  Pt seen and examined in AM. No acute events overnight. He denies dyspnea, chest pain, palpitations, LE edema, or lightheadedness. He was not taking lasix for the past few days which he usually takes only on as needed basis for LE edema.    Objective: Vital signs in last 24 hours: Filed Vitals:   09/08/14 2343 09/09/14 0345 09/09/14 0800 09/09/14 1200  BP: 135/59 125/74 139/65 113/52  Pulse: 64 61 59 62  Temp: 97.4 F (36.3 C) 97.6 F (36.4 C) 98.3 F (36.8 C) 98.5 F (36.9 C)  TempSrc: Oral Oral Oral Oral  Resp: 19 18 13 18   Height:      Weight:  237 lb 10.5 oz (107.8 kg)    SpO2: 98% 98% 99% 97%   Weight change:   Intake/Output Summary (Last 24 hours) at 09/09/14 1528 Last data filed at 09/09/14 0522  Gross per 24 hour  Intake      0 ml  Output    575 ml  Net   -575 ml   PHYSICAL EXAMINATION:  General: NAD  HEENT: JVD present Heart: Normal rate with regular rhythm.  Lungs: Decreased BS with trace crackles. No wheezing or ronchi Abdomen: Soft, non-distended, non-tender, normal BS Extremities: B/l trace edema Neuro: A & Ox 3    Lab Results: Basic Metabolic Panel:  Recent Labs Lab 09/08/14 0847 09/09/14 0853  NA 137 137  K 5.1 4.4  CL 105 100*  CO2 21* 23  GLUCOSE 276* 175*  BUN 9 20  CREATININE 1.07 1.01  CALCIUM 8.4* 8.7*  MG  --  2.0  PHOS  --  3.1   Liver Function Tests:  Recent Labs Lab 09/08/14 0847 09/09/14 0853  AST 49* 52*  ALT 37 38  ALKPHOS 162* 122  BILITOT 1.1 1.0  PROT 6.7 5.9*  ALBUMIN 3.1* 3.0*   CBC:  Recent Labs Lab 09/08/14 0847 09/09/14 0305  WBC 8.8 14.2*  NEUTROABS 6.0  --   HGB 14.9 13.5  HCT 44.9 40.1  MCV 91.4 89.9  PLT 225 197   Cardiac Enzymes:  Recent Labs Lab 09/08/14 2054 09/09/14 0305 09/09/14 0853  TROPONINI 0.34* 0.32* 0.27*   BNP: No results for input(s): PROBNP in the last 168 hours. D-Dimer: No results for input(s): DDIMER in the last 168 hours. CBG:  Recent  Labs Lab 09/08/14 1236 09/08/14 1642 09/08/14 1954  GLUCAP 234* 294* 242*   Thyroid Function Tests:  Recent Labs Lab 09/09/14 0305  TSH 0.889   Urine Drug Screen: Drugs of Abuse     Component Value Date/Time   LABOPIA NONE DETECTED 09/09/2014 1003   COCAINSCRNUR NONE DETECTED 09/09/2014 1003   LABBENZ NONE DETECTED 09/09/2014 1003   AMPHETMU NONE DETECTED 09/09/2014 1003   THCU NONE DETECTED 09/09/2014 1003   LABBARB NONE DETECTED 09/09/2014 1003    Alcohol Level: No results for input(s): ETH in the last 168 hours. Urinalysis:  Recent Labs Lab 09/09/14 1003  COLORURINE YELLOW  LABSPEC 1.017  PHURINE 5.5  GLUCOSEU NEGATIVE  HGBUR NEGATIVE  BILIRUBINUR NEGATIVE  KETONESUR NEGATIVE  PROTEINUR NEGATIVE  UROBILINOGEN 1.0  NITRITE POSITIVE*  LEUKOCYTESUR SMALL*     Micro Results: Recent Results (from the past 240 hour(s))  MRSA PCR Screening     Status: None   Collection Time: 09/08/14 12:33 PM  Result Value Ref Range Status   MRSA by PCR NEGATIVE NEGATIVE Final    Comment:        The GeneXpert  MRSA Assay (FDA approved for NASAL specimens only), is one component of a comprehensive MRSA colonization surveillance program. It is not intended to diagnose MRSA infection nor to guide or monitor treatment for MRSA infections.    Studies/Results: Dg Chest 2 View  09/09/2014   CLINICAL DATA:  Dyspnea  EXAM: CHEST  2 VIEW  COMPARISON:  Radiographs 09/08/2014  FINDINGS: Normal cardiac silhouette. There is chronic bronchitic markings. There is interval improvement in interstitial edema pattern seen on comparison exam. No pneumothorax. No focal infiltrate.  IMPRESSION: Improvement in interstitial edema pattern.   Electronically Signed   By: Suzy Bouchard M.D.   On: 09/09/2014 09:01   Dg Chest Port 1 View  09/08/2014   CLINICAL DATA:  Hypoxia and shortness of breath.  EXAM: PORTABLE CHEST - 1 VIEW  COMPARISON:  04/04/2013  FINDINGS: Cardiomegaly noted. Bilateral  interstitial and airspace opacities likely represent moderate pulmonary edema.  There is no evidence of pneumothorax.  No large pleural effusions are identified.  No acute bony abnormalities are identified.  IMPRESSION: Bilateral interstitial and airspace opacities likely representing moderate pulmonary edema. Cardiomegaly again identified.   Electronically Signed   By: Margarette Canada M.D.   On: 09/08/2014 09:11   Medications: I have reviewed the patient's current medications. Scheduled Meds: . aspirin EC  81 mg Oral Daily  . atorvastatin  40 mg Oral Daily  . carvedilol  12.5 mg Oral BID  . clopidogrel  75 mg Oral Q breakfast  . enoxaparin (LOVENOX) injection  40 mg Subcutaneous Q24H  . furosemide  40 mg Intravenous BID  . gabapentin  100 mg Oral TID  . insulin aspart  0-9 Units Subcutaneous TID WC  . quinapril  40 mg Oral Daily   Continuous Infusions:  PRN Meds:.calcium carbonate, nitroGLYCERIN, perflutren lipid microspheres (DEFINITY) IV suspension Assessment/Plan:  Acute hypoxemic respiratory failure in setting of pulmonary edema - Pt with improved dyspnea and CXR with improvement in pulmonary edema.  Pt currently with normal SpO2 on 2L [place]. Etiology likely due to AECHF vs severe hypertension (198/94) vs ACS.  -Appreciate cardiology recommendations  -Continue oxygen therapy to keep SpO2 >92% -Continue IV lasix 40 mg BID, transition to PO lasix 40 mg BID tomm  Acute on Chronic Combined CHF - Pt with improved dyspnea and CXR with improvement in pulmonary edema. Last 2D-echo on 10/05/13 with EF 45-50%. -Appreciate cardiology recommendations  -Awaiting 2D-echo results  -2 g sodium restricted diet -Continue IV lasix 40 mg BID, transition to PO lasix 40 mg BID tomm -Continue coreg 12.5 mg BID and quinapril 40 mg daily   CAD - Currently with no anginal symptoms. Troponin peaked at 0.34 most likely demand ischemia.  -Continue aspirin 81 mg daily, coreg 12.5 mg BID, and quinapril 40 mg daily    -Nitroglycerin SL 0.4 mg Q 5 min PRN chest pain -No plan for cardiac catherization at this time unless develops chest pain or marked changes in EF on echo   Hypertension - Currently normotensive -Continue home quinapril 40 mg daily and coreg 12.5 mg BID  Hyperlipidemia - Last lipid panel on 06/07/14 with LDL 41. -LDL 41 at goal <100 -Continue lipitor 40 mg daily  Non-insulin Dependent Type 2 DM - BG 175 this AM. Last A1c 7.5 on 04/06/13. Pt at home on metformin 1000 mg BID and glipizide 10 mg daily.  -Follow-up A1c -Hold metformin 1000 mg BID  -Restart home glipizide 10 mg daily  -Continue sensitive SSI with meals -Monitor CBG before meals  and at bedtime  -Follow-up urine microalbumin -Increase home gabapentin 100 mg TID to 300 mg TID for chronic peripheral neuropathy   Asymptomatic Bacteruria - Currently with no symptoms.  -No indication for starting antibiotics in absence of symptoms   -Continue to monitor  Leukocytosis - WBC 14.2 K today from 8.8 K on admission. Lactic acid also mildly elevated. No signs of infection except for asymptomatic bacteruria. Could be reactive in nature in setting of acute respiratory failure.  -Continue to monitor CBC -Repeat lactic acid in AM  Chronic elevated AST - Pt chronically elevated AST since 2013 of unclear etiology with no reported alcohol history. No past  abdominal imaging. -Follow-up acute hepatitis panel and HIV Ab -Consider abdominal US to assess for NAFLD  Moderate to Severe OSA - Last sleep study on 01/11/98. -CPAP as tolerated at night  GERD - Currently with no acid reflux symptoms. Pt at home on prilsec. -Start protonix 40 mg daily   Diet: 2 g sodium restricted  DVT Ppx: Lovenox Code: Full   Dispo: Disposition is deferred at this time, awaiting improvement of current medical problems.  Anticipated discharge in approximately 1-3 day(s).   The patient does have a current PCP Marny Lowenstein Macario Carls., MD) and does need an [facility]  hospital follow-up appointment after discharge.  The patient does not have transportation limitations that hinder transportation to clinic appointments.  .Services Needed at time of discharge: Y = Yes, Blank = No PT:   OT:   RN:   Equipment:   Other:     LOS: 1 day   Juluis Mire, MD 09/09/2014, 3:28 PM

## 2014-09-10 DIAGNOSIS — K922 Gastrointestinal hemorrhage, unspecified: Secondary | ICD-10-CM | POA: Diagnosis not present

## 2014-09-10 DIAGNOSIS — E872 Acidosis: Secondary | ICD-10-CM

## 2014-09-10 DIAGNOSIS — R1110 Vomiting, unspecified: Secondary | ICD-10-CM

## 2014-09-10 DIAGNOSIS — I959 Hypotension, unspecified: Secondary | ICD-10-CM

## 2014-09-10 LAB — ABO/RH: ABO/RH(D): A POS

## 2014-09-10 LAB — BASIC METABOLIC PANEL
Anion gap: 12 (ref 5–15)
BUN: 41 mg/dL — ABNORMAL HIGH (ref 6–20)
CO2: 27 mmol/L (ref 22–32)
Calcium: 8.6 mg/dL — ABNORMAL LOW (ref 8.9–10.3)
Chloride: 98 mmol/L — ABNORMAL LOW (ref 101–111)
Creatinine, Ser: 1.21 mg/dL (ref 0.61–1.24)
GFR calc Af Amer: >60 mL/min (ref >60)
GFR calc non Af Amer: >60 mL/min (ref >60)
Glucose, Bld: 229 mg/dL — ABNORMAL HIGH (ref 65–99)
Potassium: 4.7 mmol/L (ref 3.5–5.1)
Sodium: 137 mmol/L (ref 135–145)

## 2014-09-10 LAB — CBC WITH DIFFERENTIAL/PLATELET
BASOS ABS: 0 10*3/uL (ref 0.0–0.1)
BASOS PCT: 0 % (ref 0–1)
EOS ABS: 0 10*3/uL (ref 0.0–0.7)
EOS PCT: 0 % (ref 0–5)
HCT: 38.4 % — ABNORMAL LOW (ref 39.0–52.0)
HEMOGLOBIN: 12.7 g/dL — AB (ref 13.0–17.0)
LYMPHS PCT: 21 % (ref 12–46)
Lymphs Abs: 2.4 10*3/uL (ref 0.7–4.0)
MCH: 30.5 pg (ref 26.0–34.0)
MCHC: 33.1 g/dL (ref 30.0–36.0)
MCV: 92.3 fL (ref 78.0–100.0)
MONOS PCT: 8 % (ref 3–12)
Monocytes Absolute: 0.9 10*3/uL (ref 0.1–1.0)
NEUTROS PCT: 71 % (ref 43–77)
Neutro Abs: 8.2 10*3/uL — ABNORMAL HIGH (ref 1.7–7.7)
PLATELETS: 218 10*3/uL (ref 150–400)
RBC: 4.16 MIL/uL — ABNORMAL LOW (ref 4.22–5.81)
RDW: 15.9 % — AB (ref 11.5–15.5)
WBC: 11.5 10*3/uL — ABNORMAL HIGH (ref 4.0–10.5)

## 2014-09-10 LAB — CBC
HCT: 34.7 % — ABNORMAL LOW (ref 39.0–52.0)
Hemoglobin: 11.5 g/dL — ABNORMAL LOW (ref 13.0–17.0)
MCH: 30.5 pg (ref 26.0–34.0)
MCHC: 33.1 g/dL (ref 30.0–36.0)
MCV: 92 fL (ref 78.0–100.0)
Platelets: 245 10*3/uL (ref 150–400)
RBC: 3.77 MIL/uL — ABNORMAL LOW (ref 4.22–5.81)
RDW: 16.1 % — ABNORMAL HIGH (ref 11.5–15.5)
WBC: 18 10*3/uL — ABNORMAL HIGH (ref 4.0–10.5)

## 2014-09-10 LAB — MICROALBUMIN / CREATININE URINE RATIO
Creatinine, Urine: 74 mg/dL
MICROALB UR: 43.7 ug/mL — AB
Microalb Creat Ratio: 59.1 mg/g creat — ABNORMAL HIGH (ref 0.0–30.0)

## 2014-09-10 LAB — TYPE AND SCREEN
ABO/RH(D): A POS
Antibody Screen: NEGATIVE

## 2014-09-10 LAB — HEMOGLOBIN A1C
Hgb A1c MFr Bld: 6.7 % — ABNORMAL HIGH (ref 4.8–5.6)
Mean Plasma Glucose: 146 mg/dL

## 2014-09-10 LAB — GLUCOSE, CAPILLARY
GLUCOSE-CAPILLARY: 126 mg/dL — AB (ref 65–99)
Glucose-Capillary: 170 mg/dL — ABNORMAL HIGH (ref 65–99)
Glucose-Capillary: 173 mg/dL — ABNORMAL HIGH (ref 65–99)
Glucose-Capillary: 134 mg/dL — ABNORMAL HIGH (ref 65–99)
Glucose-Capillary: 118 mg/dL — ABNORMAL HIGH (ref 65–99)

## 2014-09-10 LAB — HEPATITIS PANEL, ACUTE
HCV Ab: <0.1 {s_co_ratio} (ref 0.0–0.9)
Hep A IgM: NEGATIVE
Hep B C IgM: NEGATIVE
Hepatitis B Surface Ag: NEGATIVE

## 2014-09-10 LAB — LACTIC ACID, PLASMA: LACTIC ACID, VENOUS: 4.6 mmol/L — AB (ref 0.5–2.0)

## 2014-09-10 LAB — TROPONIN I: TROPONIN I: 0.21 ng/mL — AB (ref <0.031)

## 2014-09-10 MED ORDER — FAMOTIDINE IN NACL 20-0.9 MG/50ML-% IV SOLN
20.0000 mg | Freq: Two times a day (BID) | INTRAVENOUS | Status: DC
  Administered 2014-09-10 – 2014-09-11 (×2): 20 mg via INTRAVENOUS
  Filled 2014-09-10 (×2): qty 50

## 2014-09-10 MED ORDER — FUROSEMIDE 40 MG PO TABS
40.0000 mg | ORAL_TABLET | Freq: Every day | ORAL | Status: DC
  Administered 2014-09-10: 40 mg via ORAL
  Filled 2014-09-10: qty 1

## 2014-09-10 MED ORDER — GABAPENTIN 100 MG PO CAPS
100.0000 mg | ORAL_CAPSULE | Freq: Three times a day (TID) | ORAL | Status: DC
  Administered 2014-09-10 – 2014-09-12 (×5): 100 mg via ORAL
  Filled 2014-09-10 (×4): qty 1

## 2014-09-10 MED ORDER — FUROSEMIDE 20 MG PO TABS
20.0000 mg | ORAL_TABLET | Freq: Every day | ORAL | Status: DC
Start: 2014-09-11 — End: 2014-09-12
  Administered 2014-09-11 – 2014-09-12 (×2): 20 mg via ORAL
  Filled 2014-09-10 (×2): qty 1

## 2014-09-10 NOTE — Progress Notes (Signed)
Patient Profile: 64 year old male followed by Dr Fletcher Anon with known CAD with past MI and RCA stents, chronic systolic CHF, ischemic cardiomyopathy, GERD, DM, HTN and HLD. Admitted 09/08/14 with acute respiratory failure and a/c CHF.   Subjective: Breathing improved significantly. No supplemental O2 requirements. Denies chest pain.   Objective: Vital signs in last 24 hours: Temp:  [97.8 F (36.6 C)-98.5 F (36.9 C)] 97.8 F (36.6 C) (08/01 0719) Pulse Rate:  [62-75] 72 (08/01 0719) Resp:  [10-23] 17 (08/01 0719) BP: (99-133)/(44-61) 117/57 mmHg (08/01 0719) SpO2:  [92 %-97 %] 96 % (08/01 0719) Weight:  [234 lb 12.6 oz (106.5 kg)] 234 lb 12.6 oz (106.5 kg) (08/01 0207)    Intake/Output from previous day: 07/31 0701 - 08/01 0700 In: -  Out: 1625 [Urine:1625] Intake/Output this shift: Total I/O In: -  Out: 450 [Urine:250; Emesis/NG output:200]  Medications Current Facility-Administered Medications  Medication Dose Route Frequency Provider Last Rate Last Dose  . aspirin EC tablet 81 mg  81 mg Oral Daily Ejiroghene E Emokpae, MD   81 mg at 09/09/14 0900  . atorvastatin (LIPITOR) tablet 40 mg  40 mg Oral Daily Ejiroghene E Emokpae, MD   40 mg at 09/09/14 0900  . calcium carbonate (TUMS - dosed in mg elemental calcium) chewable tablet 200 mg of elemental calcium  1 tablet Oral PRN Ejiroghene E Emokpae, MD      . carvedilol (COREG) tablet 12.5 mg  12.5 mg Oral BID Ejiroghene Arlyce Dice, MD   12.5 mg at 09/09/14 2149  . clopidogrel (PLAVIX) tablet 75 mg  75 mg Oral Q breakfast Ejiroghene Arlyce Dice, MD   75 mg at 09/10/14 0569  . enoxaparin (LOVENOX) injection 40 mg  40 mg Subcutaneous Q24H Ejiroghene E Emokpae, MD   40 mg at 09/09/14 1400  . furosemide (LASIX) tablet 40 mg  40 mg Oral Daily Francesca Oman, DO      . gabapentin (NEURONTIN) capsule 300 mg  300 mg Oral TID Juluis Mire, MD   300 mg at 09/09/14 2149  . glipiZIDE (GLUCOTROL XL) 24 hr tablet 10 mg  10 mg Oral Q breakfast  Marjan Rabbani, MD   10 mg at 09/10/14 7948  . insulin aspart (novoLOG) injection 0-9 Units  0-9 Units Subcutaneous TID WC Ejiroghene Arlyce Dice, MD   2 Units at 09/10/14 0165  . nitroGLYCERIN (NITROSTAT) SL tablet 0.4 mg  0.4 mg Sublingual Q5 min PRN Ejiroghene Arlyce Dice, MD      . quinapril (ACCUPRIL) tablet 40 mg  40 mg Oral Daily Ejiroghene Arlyce Dice, MD   40 mg at 09/09/14 0900    PE: General appearance: alert, cooperative and no distress Neck: no carotid bruit and no JVD Lungs: decreased BS bilaterally but no rales Heart: regular rate and rhythm, S1, S2 normal, no murmur, click, rub or gallop Extremities: no LEE Pulses: 2+ and symmetric Skin: warm and dry Neurologic: Grossly normal  Lab Results:   Recent Labs  09/08/14 0847 09/09/14 0305 09/10/14 0307  WBC 8.8 14.2* 11.5*  HGB 14.9 13.5 12.7*  HCT 44.9 40.1 38.4*  PLT 225 197 218   BMET  Recent Labs  09/08/14 0847 09/09/14 0853 09/10/14 0307  NA 137 137 137  K 5.1 4.4 4.7  CL 105 100* 98*  CO2 21* 23 27  GLUCOSE 276* 175* 229*  BUN 9 20 41*  CREATININE 1.07 1.01 1.21  CALCIUM 8.4* 8.7* 8.6*   Cardiac Panel (last 3 results)  Recent Labs  09/09/14 0305 09/09/14 0853 09/09/14 2336  TROPONINI 0.32* 0.27* 0.21*    Studies/Results: 09/09/14 Study Conclusions  - Left ventricle: The cavity size was normal. Wall thickness was increased in a pattern of moderate LVH. Systolic function was normal. The estimated ejection fraction was in the range of 50% to 55%. Doppler parameters are consistent with abnormal left ventricular relaxation (grade 1 diastolic dysfunction). - Aortic valve: Mildly calcified annulus. Trileaflet; mildly thickened leaflets. Valve area (VTI): 2.23 cm^2. Valve area (Vmax): 2.17 cm^2. - Mitral valve: Mildly calcified annulus. Mildly thickened leaflets . - Right ventricle: The cavity size was mildly dilated. - Technically difficult study. Echocontrast was used to  enhance visualization.    Assessment/Plan  Principal Problem:   Acute respiratory failure with hypoxia Active Problems:   MI (myocardial infarction)   Pulmonary edema   DM type 2 (diabetes mellitus, type 2)   HTN (hypertension)   Chronic systolic heart failure   Hyperlipidemia   Acute pulmonary edema   1. Acute on Chronic Diastolic CHF: echo shows normal LVF with EF of 50-55%. This is slightly improved from 09/2013 when EF was 45-50%. Significant improvement in breathing. No longer requiring supplemental O2. Good diuresis. I/Os net negative 2.8 L. Renal function and BB both stable. Continue PO Lasix, BB and ACE-I. Low sodium diet and daily weights on discharge.   2. Elevated Troponin: mild elevation with downward trend. 0.32, 0.27, 0.21. EF normal and even improved since previous study. He denies any chest pain. This is likely 2/2 demand ischemia from CHF/ ARF. No ischemic w/u indicated.  3. CAD: previous RCA stenting in 2015. Stable w/o CP. Continue ASA, statin, BB and ACE-I for secondary prevention.  4. HTN: BP controlled on current regimen.    5. HLD: continue statin.  6. DM: poorly controlled. FPG >200. Management per IM.     LOS: 2 days    Leno Mathes M. Rosita Fire, PA-C 09/10/2014 8:58 AM

## 2014-09-10 NOTE — Progress Notes (Signed)
Subjective: Devin Hanna was seen and examined this AM.  Dyspnea improved.  Had an episode of N/V this AM.  Objective: Vital signs in last 24 hours: Filed Vitals:   09/10/14 0335 09/10/14 0719 09/10/14 0909 09/10/14 1218  BP: 100/44 117/57 109/55 96/84  Pulse: 72 72 77 78  Temp: 98 F (36.7 C) 97.8 F (36.6 C)  97.2 F (36.2 C)  TempSrc: Oral Oral  Oral  Resp: 18 17  14   Height:      Weight:      SpO2: 94% 96%  99%   Weight change: -5 lb 3.4 oz (-2.363 kg)  Intake/Output Summary (Last 24 hours) at 09/10/14 1401 Last data filed at 09/10/14 1343  Gross per 24 hour  Intake    240 ml  Output   2075 ml  Net  -1835 ml   General: resting in bed, laying flat, NAD HEENT: [place]/AT Cardiac: RRR, no rubs, murmurs or gallops Pulm: coarse BS, crackles, moving normal volumes of air Abd: soft, nontender, nondistended, BS present Ext: warm and well perfused, trace B/L pedal edema Neuro: alert and oriented X3, responding appropriately  Lab Results: Basic Metabolic Panel:  Recent Labs Lab 09/09/14 0853 09/10/14 0307  NA 137 137  K 4.4 4.7  CL 100* 98*  CO2 23 27  GLUCOSE 175* 229*  BUN 20 41*  CREATININE 1.01 1.21  CALCIUM 8.7* 8.6*  MG 2.0  --   PHOS 3.1  --    Liver Function Tests:  Recent Labs Lab 09/08/14 0847 09/09/14 0853  AST 49* 52*  ALT 37 38  ALKPHOS 162* 122  BILITOT 1.1 1.0  PROT 6.7 5.9*  ALBUMIN 3.1* 3.0*   CBC:  Recent Labs Lab 09/08/14 0847 09/09/14 0305 09/10/14 0307  WBC 8.8 14.2* 11.5*  NEUTROABS 6.0  --  8.2*  HGB 14.9 13.5 12.7*  HCT 44.9 40.1 38.4*  MCV 91.4 89.9 92.3  PLT 225 197 218   Cardiac Enzymes:  Recent Labs Lab 09/09/14 0305 09/09/14 0853 09/09/14 2336  TROPONINI 0.32* 0.27* 0.21*   CBG:  Recent Labs Lab 09/08/14 1642 09/08/14 1954 09/09/14 1631 09/09/14 2004 09/10/14 0720 09/10/14 1220  GLUCAP 294* 242* 172* 203* 170* 173*    Medications: I have reviewed the patient's current medications. Scheduled  Meds: . aspirin EC  81 mg Oral Daily  . atorvastatin  40 mg Oral Daily  . carvedilol  12.5 mg Oral BID  . clopidogrel  75 mg Oral Q breakfast  . enoxaparin (LOVENOX) injection  40 mg Subcutaneous Q24H  . furosemide  40 mg Oral Daily  . gabapentin  300 mg Oral TID  . glipiZIDE  10 mg Oral Q breakfast  . insulin aspart  0-9 Units Subcutaneous TID WC  . quinapril  40 mg Oral Daily   Continuous Infusions: none PRN Meds:.calcium carbonate, nitroGLYCERIN   Assessment/Plan: Acute respiratory failure with hypoxia:  Resolved.  He is laying flat and comfortable on room air.  This may have been due to HF exacerbation but EF is actually improved this admission to 5055%. - decrease Lasix to 87m today, likely decrease further tomorrow - transfer to tele - continue I/Os and daily weights - oxygen supplementation prn - outpatient PFTs  CAD:  No CP today.  Trop trending down.  No ischemic work-up per cards. - continue ASA, Plavix, BB, ACEI, statin  Lactic acidosis:  No signs of sepsis.  This may be due to aggressive diuresis.   - decreasing Lasix as  above - trend LA  DM type 2 (diabetes mellitus, type 2):  CBG 170 this AM.   - resume glipizide this AM - CBG ac/hs  HTN (hypertension):  BP low-nomal.  - decrease quinapril from 59m to 231mstarting tomorrow - decreasing Lasix as above - add hold parameters to metoprolol  ADDENDUM:  Returned to evaluate patient this afternoon around 6PM due to asymptomatic hypotension.  He was seen with daughter and granddaughter at bedside.  He denied pain, chest pain, dyspnea or lightheadedness.  He has not eaten because he still feels nauseous.  Exam unchanged from this AM documented above.  As I was about to leave the room, his daughter ask why he got sick this morning and mentioned that he vomited dark substance.  Upon further questioning the patient reported that the episode of emesis this AM was dark and perhaps would fill half of the portable urinal.  He  also had one dark stool this AM.  No further vomiting or stools today.  He denies EtOH abuse, liver disease or hx of GI bleed.  CBC has been downtrending despite aggressive diuresis.  STAT CBC revealed Hgb 11.5 down from 12.7 this AM and 14.9 at admission.  FOBT pending.  He has been typed and screened.   This is likely UGIB but not brisk and he is hemodynamically stable.   No indication for transfusion at this time.  Will start IV Pepcid BID (no PPI due to Plavix use), repeat CBC tomorrow AM and consult GI tomorrow AM for EGD.  Night team to give small bolus 250 cc if clinically indicated (ie patient symptomatic and/or SBP <90).  Dispo: Disposition is deferred at this time, awaiting improvement of current medical problems.  Anticipated discharge in approximately 1-2 day(s).   The patient does have a current PCP (CMarny LowensteinhMacario Carls MD) and [facility].  The patient does not know have transportation limitations that hinder transportation to clinic appointments.  .Services Needed at time of discharge: Y = Yes, Blank = No PT:   OT:   RN:   Equipment:   Other:     LOS: 2 days   AlFrancesca OmanDO 09/10/2014, 2:01 PM
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
## 2014-09-10 NOTE — Progress Notes (Signed)
Report called to receiving nurse. Patient being transferred to 3W19. Patient made family aware of transfer.

## 2014-09-10 NOTE — Progress Notes (Signed)
CRITICAL VALUE ALERT  Critical value received:  Lactic Acid 4.6   Date of notification:  09/10/2014  Time of notification:  0510  Critical value read back:Yes.    Nurse who received alert:  Annice Pih, RN  MD notified (1st page):  Internal medicine teaching service  Time of first page:  [phone]  MD notified (2nd page):  Time of second page:  Responding MD: internal medicine teaching services.   Time MD responded:  [phone]

## 2014-09-10 NOTE — Progress Notes (Signed)
Patient's BP 80's/40's. Paged Internal Medicine Holley Raring) to make aware. Manual BP 90/52. Patient denies any pain or discomfort. OK to continue with transfer per Internal Medicine Holley Raring).

## 2014-09-10 NOTE — Progress Notes (Signed)
Pt. Refused cpap. RT informed pt. To notify if he changes his mind. 

## 2014-09-10 NOTE — Progress Notes (Signed)
Internal Medicine Attending  Date: 09/10/2014  Patient name: RAMSEY GUADAMUZ Medical record number: [id number] Date of birth: 08/08/1950 Age: 64 y.o. Gender: male  I saw and evaluated the patient. I reviewed the resident's note by Dr. Redmond Pulling and I agree with the resident's findings and plans as documented in her progress note.  When seen on rounds this morning Mr. Crispo felt well without chest pain and noted improved shortness of breath. Physical exam revealed expiratory crackles without wheezes or inspiratory rales. The cardiac exam was distant but without obvious murmurs or rubs. Jugular venous pressure was not appreciated this morning. He was felt to be stable for transfer to the general medical ward. This evening he had asymptomatic hypotension and his daughter noted he vomited coffee like material this morning. We are holding his antihypertensives tonight are obtaining a stool for guaiac and repeating the CBC to assure his hemoglobin is stable. We will consult GI tomorrow to consider an EGD to assess for an upper GI bleed. The hypotension may also be related to aggressive diuresis. Given his clinical stability and the fact that he is feeling, well we will continue to monitor him on the general medical ward.

## 2014-09-10 NOTE — Progress Notes (Signed)
Subjective:    Currently, the patient is feeling well. Breathing well on room air with no respiratory distress. No CP. Had some nausea this AM, with small amount of non-bloody, non-billious emesis, resolved and feeling well currently. Up and ambulating to the bathroom, will work on ambulation with PT today.  Interval Events: Troponin x 2 downtrending. EKG O/N due to PVCs on tele. EKG with occasional PVCs, otherwise unchanged. Lactate elevated to 4.6 O/N. Leukocytosis downtrending.   Objective:    Vital Signs:   Temp:  [97.2 F (36.2 C)-98.1 F (36.7 C)] 97.2 F (36.2 C) (08/01 1218) Pulse Rate:  [66-78] 78 (08/01 1218) Resp:  [10-23] 14 (08/01 1218) BP: (96-133)/(44-84) 96/84 mmHg (08/01 1218) SpO2:  [92 %-99 %] 99 % (08/01 1218) Weight:  [106.5 kg (234 lb 12.6 oz)] 106.5 kg (234 lb 12.6 oz) (08/01 0207) Last BM Date: 09/09/14  24-hour weight change: Weight change: -2.363 kg (-5 lb 3.4 oz)  Intake/Output:   Intake/Output Summary (Last 24 hours) at 09/10/14 1248 Last data filed at 09/10/14 0900  Gross per 24 hour  Intake    120 ml  Output   2075 ml  Net  -1955 ml      Physical Exam: General: Vital signs reviewed and noted. Well-developed, well-nourished, in no acute distress; alert, appropriate and cooperative throughout examination.  Lungs:  Normal respiratory effort. Fine crackles bil throughout, no rales, no wheeze.  Heart: RRR. S1 and S2 normal without gallop, murmur, or rubs.  Abdomen:  BS normoactive. Obese, soft, nondistended, non-tender.  No masses or organomegaly. No CVA tenderness.  Extremities: No pretibial edema     Labs:  Basic Metabolic Panel:  Recent Labs Lab 09/08/14 0847 09/09/14 0853 09/10/14 0307  NA 137 137 137  K 5.1 4.4 4.7  CL 105 100* 98*  CO2 21* 23 27  GLUCOSE 276* 175* 229*  BUN 9 20 41*  CREATININE 1.07 1.01 1.21  CALCIUM 8.4* 8.7* 8.6*  MG  --  2.0  --   PHOS  --  3.1  --     Liver Function Tests:  Recent Labs Lab  09/08/14 0847 09/09/14 0853  AST 49* 52*  ALT 37 38  ALKPHOS 162* 122  BILITOT 1.1 1.0  PROT 6.7 5.9*  ALBUMIN 3.1* 3.0*   CBC:  Recent Labs Lab 09/08/14 0847 09/09/14 0305 09/10/14 0307  WBC 8.8 14.2* 11.5*  NEUTROABS 6.0  --  8.2*  HGB 14.9 13.5 12.7*  HCT 44.9 40.1 38.4*  MCV 91.4 89.9 92.3  PLT 225 197 218    Cardiac Enzymes:  Recent Labs Lab 09/08/14 0847 09/08/14 2054 09/09/14 0305 09/09/14 0853 09/09/14 2336  TROPONINI 0.03 0.34* 0.32* 0.27* 0.21*   BNP" Recent Labs     09/08/14  0847  BNP  296.0*    CBG:  Recent Labs Lab 09/08/14 1954 09/09/14 1631 09/09/14 2004 09/10/14 0720 09/10/14 1220  GLUCAP 242* 172* 203* 170* 173*    Microbiology: Results for orders placed or performed during the hospital encounter of 09/08/14  MRSA PCR Screening     Status: None   Collection Time: 09/08/14 12:33 PM  Result Value Ref Range Status   MRSA by PCR NEGATIVE NEGATIVE Final    Comment:        The GeneXpert MRSA Assay (FDA approved for NASAL specimens only), is one component of a comprehensive MRSA colonization surveillance program. It is not intended to diagnose MRSA infection nor to guide or monitor treatment for MRSA  infections.     Other results: EKG: normal sinus rhythm, LBBB, occasional PVCs.  Imaging: Dg Chest 2 View  09/09/2014   CLINICAL DATA:  Dyspnea  EXAM: CHEST  2 VIEW  COMPARISON:  Radiographs 09/08/2014  FINDINGS: Normal cardiac silhouette. There is chronic bronchitic markings. There is interval improvement in interstitial edema pattern seen on comparison exam. No pneumothorax. No focal infiltrate.  IMPRESSION: Improvement in interstitial edema pattern.   Electronically Signed   By: Suzy Bouchard M.D.   On: 09/09/2014 09:01      Medications:    Infusions:    Scheduled Medications: . aspirin EC  81 mg Oral Daily  . atorvastatin  40 mg Oral Daily  . carvedilol  12.5 mg Oral BID  . clopidogrel  75 mg Oral Q breakfast    . enoxaparin (LOVENOX) injection  40 mg Subcutaneous Q24H  . furosemide  40 mg Oral Daily  . gabapentin  300 mg Oral TID  . glipiZIDE  10 mg Oral Q breakfast  . insulin aspart  0-9 Units Subcutaneous TID WC  . quinapril  40 mg Oral Daily    PRN Medications: calcium carbonate, nitroGLYCERIN   Assessment/ Plan:    Pt is a 64 y.o. yo male with a PMHx of CHF (EF 45% Aug. 2015), CAD (s/p MI Feb. 2015), HTN, and t2DM who was admitted on 09/08/2014 with symptoms of respiratory distress, which was determined to be secondary to pulmonary edema. Interventions at this time will be focused on diuresis.   1) Acute hypoxemic respiratory distress 2/2 pulmonary edema: Resolved. Trop leak resolving, likely 2/2 ischemic demand. EKG negative for ischemic changes. DDx includes CHF exacerbation vs hypertensive urgency vs ACS vs acute valvular disease vs arrhythmia. PE unlikely given lack of sx for DVT and no tachycardia ("unlikely" by Wells score). Infectious etiology low given lack of fever and negative CXR. BNP at 296 without clear baseline. Takes po Lasix as needed at home, weights recently stable. BP elevated to 779T systolic on presentation. -- Cardiology consulted appreciate recs:  - Echo demonstrated EF 50-55% (improved from previous at 45-50%), grade 1 diastolic dysfx, no significant valvular disease, unable to assess WMA  - Transition to PO lasix for diuresis  - No ACS w/u at this time -- UDS neg -- Sating well on RA -- Continue home ACE-I, BB, and statin -- 4m PO Lasix qD -- OOB today as tolerated, PT eval and treat -- Strict I/Os, daily wts -- 2g sodium restricted diet -- Discontinue tele after 48hrs -- Transfer to the floor  2) CAD: c/o chest tightness. No recent chest pain/anginal symptoms. h/o MI with stenting. Trop leak resolving, likely secondary to ischemic demand. Today EKG w/ PVCs, unchanged otherwise. Cardiology consulted, no ischemic w/u. -- ACE-I, BB, statin as above -- Continue  ASA and Plavix -- Nitro PRN CP  3) Leukocytosis: WBC downtrending at 11.5 (peaked at 14.2). Likely reactive in the setting of respiratory distress vs. mild UTI. Pulmonary/Abd source unlikely given lack of infiltrates on CXR and benign belly exam. No history of recent steroid use. Lactic acid elevated to 4.6 today, peaked at 4.7 on admission. -- Continue to monitor CBC -- U/A positive for Nitrite and Leuks with many bacteria  4) Lactic acidosis: 4.7 on admission, downtrended to 2.7, rebounded to 4.6 this AM. Sepsis unlikely given lack of systemic inflammatory symptoms. Transient ischemic spike possible 2/2 aggressive diuresis. Continue to monitor.  5) Asymptomatic Bacteruria: Denies dysuria. No indication for abx currently. Continue to  monitor.  6) HTN: Stable -- Continue home meds as above  7) t2DM: BG elevated at 276. -- Restart home glipizide XL 22m qD. -- SSI w/ meals as needed -- Gabapentin 3071mTID for neuropathy  8) GERD: -- Continue home omeprazole -- TUMS PRN  FEN/GI: -- Heart healthy diet, 2g sodium restriction -- No IVF -- Strict I/Os -- Encourage PO hydration   DVT PPX - low molecular weight heparin  CODE STATUS - Full  CONSULTS PLACED - Cards  DISPO - Disposition is deferred at this time, awaiting improvement of pulmonary edema and respiratory status, and findings from cardiac studies.   Anticipated discharge in approximately 2 day(s).   The patient does have a current PCP (PHILLIPS JR, CHMarny LowensteinMD) and [facility].    [facility] a one-time only appointment? no.  Does the patient have transportation limitations that hinder transportation to clinic appointments? no   [place]       Y = Yes, Blank = No PT:   OT:   RN:   Equipment:   Other:     Length of Stay: 2 day(s)  This is a MeCareers information officerote.   The care of the patient was discussed with Dr. RaNaaman Plummernd the assessment and plan formulated with their assistance.  Please see their attached note for official documentation of the daily encounter.  BrEffie BerkshireMS4 Pager: [phone]-5PM) 09/10/2014, 12:48 PM

## 2014-09-11 DIAGNOSIS — K921 Melena: Secondary | ICD-10-CM

## 2014-09-11 LAB — BASIC METABOLIC PANEL
Anion gap: 11 (ref 5–15)
BUN: 71 mg/dL — ABNORMAL HIGH (ref 6–20)
CO2: 25 mmol/L (ref 22–32)
Calcium: 8.7 mg/dL — ABNORMAL LOW (ref 8.9–10.3)
Chloride: 101 mmol/L (ref 101–111)
Creatinine, Ser: 1.28 mg/dL — ABNORMAL HIGH (ref 0.61–1.24)
GFR calc non Af Amer: 57 mL/min — ABNORMAL LOW (ref >60)
Glucose, Bld: 129 mg/dL — ABNORMAL HIGH (ref 65–99)
POTASSIUM: 4.2 mmol/L (ref 3.5–5.1)
Sodium: 137 mmol/L (ref 135–145)

## 2014-09-11 LAB — CBC
HCT: 35.1 % — ABNORMAL LOW (ref 39.0–52.0)
Hemoglobin: 11.6 g/dL — ABNORMAL LOW (ref 13.0–17.0)
MCH: 29.7 pg (ref 26.0–34.0)
MCHC: 33 g/dL (ref 30.0–36.0)
MCV: 90 fL (ref 78.0–100.0)
PLATELETS: 223 10*3/uL (ref 150–400)
RBC: 3.9 MIL/uL — AB (ref 4.22–5.81)
RDW: 16.3 % — AB (ref 11.5–15.5)
WBC: 15.9 10*3/uL — ABNORMAL HIGH (ref 4.0–10.5)

## 2014-09-11 LAB — GLUCOSE, CAPILLARY
Glucose-Capillary: 147 mg/dL — ABNORMAL HIGH (ref 65–99)
Glucose-Capillary: 206 mg/dL — ABNORMAL HIGH (ref 65–99)
Glucose-Capillary: 139 mg/dL — ABNORMAL HIGH (ref 65–99)
Glucose-Capillary: 169 mg/dL — ABNORMAL HIGH (ref 65–99)

## 2014-09-11 MED ORDER — LISINOPRIL 20 MG PO TABS
20.0000 mg | ORAL_TABLET | Freq: Every day | ORAL | Status: DC
  Administered 2014-09-12: 20 mg via ORAL
  Filled 2014-09-11: qty 1

## 2014-09-11 MED ORDER — PANTOPRAZOLE SODIUM 40 MG PO TBEC
40.0000 mg | DELAYED_RELEASE_TABLET | Freq: Two times a day (BID) | ORAL | Status: DC
  Administered 2014-09-11 – 2014-09-12 (×2): 40 mg via ORAL
  Filled 2014-09-11 (×2): qty 1

## 2014-09-11 MED ORDER — QUINAPRIL HCL 10 MG PO TABS: 20.0000 mg | ORAL_TABLET | Freq: Every day | ORAL | Status: DC

## 2014-09-11 NOTE — Consult Note (Signed)
Referring Provider: Dr. Oval Linsey (internal medicine teaching service) Primary Care Physician:  [name], MD Primary Gastroenterologist:  None (unassigned)  Reason for Consultation:  GI bleed  HPI: Devin Hanna is a 64 y.o. male admitted several days ago with flash pulmonary edema, in the setting of several heart attacks in the remote past which occurred many years ago and are probably poorly documented. His current EF, by echo, is 50-55%.  In any event, the patient had large-volume coffee-ground emesis yesterday, and since admission, has had a progressive decline in hemoglobin of approximately 3 g, from 14.9 to a current level of 11.6. During this time, there was also a significant rise in his BUN, from 9 to a current level of 71. The patient is on daily aspirin as an outpatient. He uses Prilosec on an occasional basis, not consistently. He has not been having recent significant dyspeptic symptoms, anorexia, or nausea apart from the vomiting which occurred following admission. He has no prior history of ulcer disease or GI bleeding. He had a colonoscopy in 2010 that was negative except for mild sigmoid diverticulosis (Dr. Verl Blalock)..   Past Medical History  Diagnosis Date  . MI (myocardial infarction)     "has not seen cardiologist in several years"  . Diabetes mellitus     sees Dr. Hardin Negus in [place]  . Hyperlipidemia   . Vertigo     Past Surgical History  Procedure Laterality Date  . Coronary stent placement    . External fixation leg Left 04/03/2012    Procedure: EXTERNAL FIXATION LEG;  Surgeon: Wylene Simmer, MD;  Location: [facility];  Service: Orthopedics;  Laterality: Left;  . Irrigation and debridement knee Left 04/03/2012    Procedure: IRRIGATION AND DEBRIDEMENT KNEE;  Surgeon: Wylene Simmer, MD;  Location: [facility];  Service: Orthopedics;  Laterality: Left;  . Orif tibia plateau Left 04/14/2012    Procedure: OPEN REDUCTION INTERNAL FIXATION (ORIF) TIBIAL  PLATEAU;  Surgeon: Wylene Simmer, MD;  Location: [facility];  Service: Orthopedics;  Laterality: Left;  . External fixation removal Left 04/14/2012    Procedure: REMOVAL EXTERNAL FIXATION LEG;  Surgeon: Wylene Simmer, MD;  Location: [facility];  Service: Orthopedics;  Laterality: Left;  . Left heart catheterization with coronary angiogram N/A 04/05/2013    Procedure: LEFT HEART CATHETERIZATION WITH CORONARY ANGIOGRAM;  Surgeon: [name], MD;  Location: [facility] CATH LAB;  Service: Cardiovascular;  Laterality: N/A;  . Percutaneous coronary rotoblator intervention (pci-r) N/A 04/06/2013    Procedure: PERCUTANEOUS CORONARY ROTOBLATOR INTERVENTION (PCI-R);  Surgeon: [name], MD;  Location: [facility] CATH LAB;  Service: Cardiovascular;  Laterality: N/A;    Prior to Admission medications   Medication Sig Start Date End Date Taking? Authorizing Provider  aspirin EC 81 MG EC tablet Take 1 tablet (81 mg total) by mouth daily. 04/08/13  Yes Debbe Odea, MD  atorvastatin (LIPITOR) 40 MG tablet Take 1 tablet (40 mg total) by mouth daily. 02/07/14  Yes [name], MD  calcium carbonate (TUMS - DOSED IN MG ELEMENTAL CALCIUM) 500 MG chewable tablet Chew 1 tablet by mouth as needed for indigestion or heartburn.   Yes Historical Provider, MD  carvedilol (COREG) 12.5 MG tablet Take 1 tablet (12.5 mg total) by mouth 2 (two) times daily. 06/12/14  Yes [name], MD  Cinnamon 500 MG capsule Take 1,000 mg by mouth 2 (two) times daily with a meal.   Yes Historical Provider, MD  clopidogrel (PLAVIX) 75 MG tablet  Take 1 tablet (75 mg total) by mouth daily with breakfast. 08/14/14  Yes [name], MD  furosemide (LASIX) 20 MG tablet Take 20 mg by mouth daily as needed (swelling).   Yes Historical Provider, MD  gabapentin (NEURONTIN) 100 MG capsule Take 100 mg by mouth 3 (three) times daily.   Yes Historical Provider, MD  glipiZIDE (GLUCOTROL XL) 10 MG 24 hr tablet Take 10 mg by mouth daily with breakfast.    Yes  Historical Provider, MD  metFORMIN (GLUCOPHAGE) 1000 MG tablet Take 1,000 mg by mouth 2 (two) times daily with a meal.   Yes Historical Provider, MD  Multiple Vitamin (MULTIVITAMIN WITH MINERALS) TABS Take 1 tablet by mouth daily.   Yes Historical Provider, MD  omeprazole (PRILOSEC OTC) 20 MG tablet Take 20 mg by mouth daily as needed (heartburn).   Yes Historical Provider, MD  quinapril (ACCUPRIL) 40 MG tablet Take 1 tablet (40 mg total) by mouth daily. 06/11/14  Yes [name], MD  nitroGLYCERIN (NITROSTAT) 0.4 MG SL tablet Place 0.4 mg under the tongue every 5 (five) minutes as needed for chest pain.    Historical Provider, MD    Current Facility-Administered Medications  Medication Dose Route Frequency Provider Last Rate Last Dose  . aspirin EC tablet 81 mg  81 mg Oral Daily Ejiroghene E Emokpae, MD   81 mg at 09/11/14 1025  . atorvastatin (LIPITOR) tablet 40 mg  40 mg Oral Daily Ejiroghene E Emokpae, MD   40 mg at 09/11/14 1025  . calcium carbonate (TUMS - dosed in mg elemental calcium) chewable tablet 200 mg of elemental calcium  1 tablet Oral PRN Ejiroghene E Emokpae, MD      . clopidogrel (PLAVIX) tablet 75 mg  75 mg Oral Q breakfast Ejiroghene Arlyce Dice, MD   75 mg at 09/11/14 0625  . enoxaparin (LOVENOX) injection 40 mg  40 mg Subcutaneous Q24H Ejiroghene E Emokpae, MD   40 mg at 09/11/14 1420  . furosemide (LASIX) tablet 20 mg  20 mg Oral Daily Francesca Oman, DO   20 mg at 09/11/14 1025  . gabapentin (NEURONTIN) capsule 100 mg  100 mg Oral TID Francesca Oman, DO   100 mg at 09/11/14 1721  . glipiZIDE (GLUCOTROL XL) 24 hr tablet 10 mg  10 mg Oral Q breakfast Marjan Rabbani, MD   10 mg at 09/11/14 1026  . insulin aspart (novoLOG) injection 0-9 Units  0-9 Units Subcutaneous TID WC Ejiroghene Arlyce Dice, MD   1 Units at 09/11/14 1722  . [START ON 09/12/2014] lisinopril (PRINIVIL,ZESTRIL) tablet 20 mg  20 mg Oral Daily Oval Linsey, MD      . nitroGLYCERIN (NITROSTAT) SL tablet 0.4 mg   0.4 mg Sublingual Q5 min PRN Ejiroghene Arlyce Dice, MD      . pantoprazole (PROTONIX) EC tablet 40 mg  40 mg Oral BID WC Francesca Oman, DO   40 mg at 09/11/14 1721    Allergies as of 09/08/2014  . (No Known Allergies)    Family History  Problem Relation Age of Onset  . Coronary artery disease Brother   . Cancer Brother   . Coronary artery disease Father     History   Social History  . Marital Status: Single    Spouse Name: N/A  . Number of Children: N/A  . Years of Education: N/A   Occupational History  . heavy Company secretary    Social History Main Topics  . Smoking  status: Former Smoker    Quit date: 04/05/1995  . Smokeless tobacco: Not on file  . Alcohol Use: No     Comment: Occassional Use  . Drug Use: No  . Sexual Activity: Not on file   Other Topics Concern  . Not on file   Social History Narrative   Heavy Company secretary.       Divorced    Review of Systems: Negative for anorexia, weight loss; positive for reflux generally controlled by PPI therapy. Still enjoys working as a Development worker, community  Physical Exam: Vital signs in last 24 hours: Temp:  [97.9 F (36.6 C)-98.8 F (37.1 C)] 98.7 F (37.1 C) (08/02 1930) Pulse Rate:  [64-77] 74 (08/02 1930) Resp:  [15-21] 19 (08/02 1930) BP: (76-113)/(36-57) 92/46 mmHg (08/02 1930) SpO2:  [82 %-100 %] 100 % (08/02 1930) Weight:  [106.6 kg (235 lb 0.2 oz)] 106.6 kg (235 lb 0.2 oz) (08/02 0415) Last BM Date: 09/10/14 General:   Alert,  Well-developed, well-nourished, pleasant and cooperative in NAD. There is moderate pallor of the palms. Head:  Normocephalic and atraumatic. Eyes:  Sclera clear, no icterus.   Conjunctiva pink. Mouth:   No ulcerations or lesions.  Oropharynx pink & moist. Neck:   No masses or thyromegaly. Lungs:  Clear throughout to auscultation.   No wheezes, crackles, or rhonchi. No evident respiratory distress. Heart:   Regular rate and rhythm; soft systolic murmur, but no clicks,  rubs,  or gallops. Abdomen:  Soft, nontender, nontympanitic, and nondistended. No masses, hepatosplenomegaly or ventral hernias noted. Moderately obese Msk:   Symmetrical without gross deformities. Pulses:  Normal radial pulse is noted. Extremities:   Without clubbing, cyanosis, or edema. Neurologic:  Alert and coherent;  grossly normal neurologically. Skin:  Intact without significant lesions or rashes. Cervical Nodes:  No significant cervical adenopathy. Psych:   Alert and cooperative. Normal mood and affect.  Intake/Output from previous day: 08/01 0701 - 08/02 0700 In: 650 [P.O.:600; IV Piggyback:50] Out: 850 [Urine:650; Emesis/NG output:200] Intake/Output this shift:    Lab Results:  Recent Labs  09/10/14 0307 09/10/14 1841 09/11/14 0459  WBC 11.5* 18.0* 15.9*  HGB 12.7* 11.5* 11.6*  HCT 38.4* 34.7* 35.1*  PLT 218 245 223   BMET  Recent Labs  09/09/14 0853 09/10/14 0307 09/11/14 0459  NA 137 137 137  K 4.4 4.7 4.2  CL 100* 98* 101  CO2 23 27 25   GLUCOSE 175* 229* 129*  BUN 20 41* 71*  CREATININE 1.01 1.21 1.28*  CALCIUM 8.7* 8.6* 8.7*   LFT  Recent Labs  09/09/14 0853  PROT 5.9*  ALBUMIN 3.0*  AST 52*  ALT 38  ALKPHOS 122  BILITOT 1.0   PT/INR No results for input(s): LABPROT, INR in the last 72 hours.  Studies/Results: No results found.  Impression: Subacute GI bleeding characterized by coffee-ground emesis. Unclear if this patient had already been having GI bleeding when he presented with dyspnea, and the blood tests simply took a couple of days to reflect it. His use of aspirin, and inconsistent use of PPI therapy, makes ulcer disease the most likely explanation. He does not appear to be having acute bleeding at this time.  Plan: Endoscopic evaluation tomorrow. Petra Kuba, purpose, and risks reviewed. This will hopefully guide therapy in the future, with particular reference to his use of aspirin and omeprazole.   LOS: 3 days   Donzella Carrol  V  09/11/2014, 7:43 PM   Pager [phone] If no answer or after  5 PM call [phone]

## 2014-09-11 NOTE — Procedures (Signed)
Pt refused CPAP

## 2014-09-11 NOTE — Discharge Instructions (Addendum)
You were hospitalized for acute respiratory failure which resulted from fluid accumulating in your lungs. This was most likely due to an acute change in your heart's ability to pump, which could have resulted from high blood pressures or under treatment with your fluid pills. While you were here, we evaluated you for a possible heart attack and feel that this is unlikely. The cardiologists recommended that your fluid pills (Lasix) be increased to 29m every day in order to prevent this in the future. You will follow-up with your cardiologist, Dr. [name] after leaving the hospital, who will continue to monitor this. Please weigh yourself every day in order to assess whether you begin to retain fluid in your body. If your weight begins to increase and you gain more that 3 pounds in 1-2 days, please notify your cardiologist in order to adjust your medications.   While you were in the hospital, you had an episode of bleeding from your gastrointestinal tract. The dark vomit and dark stools are a sign of blood, and we consulted the Gastroenterologists in order to investigate this further. They preformed a video-scope procedure to view your esophagus, stomach, and the beginnings of your small intestines. This study revealed 3 benign appearing ulcers, consistent with aspirin-induced gastropathy, that are the likely source of your bleeding. You were started on an stomach acid reducing medication to help prevent future bleeding. This medication, pantoprozole (Protonix), is similar to the one your were taking previously, omeprazole (Prilosec), but it will be safer to take in conjunction with your Plavix. You will follow-up with the gastroenterologists after leaving the hospital. You should STOP taking your asprin for 4 weeks in order to help you stomach ulcers heal. After 4 weeks, you can resume taking your daily aspirin if your cardiologist agrees with this plan.  Peptic Ulcer A peptic ulcer is a sore in the lining of  your esophagus (esophageal ulcer), stomach (gastric ulcer), or in the first part of your small intestine (duodenal ulcer). The ulcer causes erosion into the deeper tissue. CAUSES  Normally, the lining of the stomach and the small intestine protects itself from the acid that digests food. The protective lining can be damaged by:  An infection caused by a bacterium called Helicobacter pylori (H. pylori).  Regular use of nonsteroidal anti-inflammatory drugs (NSAIDs), such as ibuprofen or aspirin.  Smoking tobacco. Other risk factors include being older than 52 drinking alcohol excessively, and having a family history of ulcer disease.  SYMPTOMS   Burning pain or gnawing in the area between the chest and the belly button.  Heartburn.  Nausea and vomiting.  Bloating. The pain can be worse on an empty stomach and at night. If the ulcer results in bleeding, it can cause:  Black, tarry stools.  Vomiting of bright red blood.  Vomiting of coffee-ground-looking materials. DIAGNOSIS  A diagnosis is usually made based upon your history and an exam. Other tests and procedures may be performed to find the cause of the ulcer. Finding a cause will help determine the best treatment. Tests and procedures may include:  Blood tests, stool tests, or breath tests to check for the bacterium H. pylori.  An upper gastrointestinal (GI) series of the esophagus, stomach, and small intestine.  An endoscopy to examine the esophagus, stomach, and small intestine.  A biopsy. TREATMENT  Treatment may include:  Eliminating the cause of the ulcer, such as smoking, NSAIDs, or alcohol.  Medicines to reduce the amount of acid in your digestive tract.  Antibiotic  medicines if the ulcer is caused by the H. pylori bacterium.  An upper endoscopy to treat a bleeding ulcer.  Surgery if the bleeding is severe or if the ulcer created a hole somewhere in the digestive system. HOME CARE INSTRUCTIONS   Avoid  tobacco, alcohol, and caffeine. Smoking can increase the acid in the stomach, and continued smoking will impair the healing of ulcers.  Avoid foods and drinks that seem to cause discomfort or aggravate your ulcer.  Only take medicines as directed by your caregiver. Do not substitute over-the-counter medicines for prescription medicines without talking to your caregiver.  Keep any follow-up appointments and tests as directed. SEEK MEDICAL CARE IF:   Your do not improve within 7 days of starting treatment.  You have ongoing indigestion or heartburn. SEEK IMMEDIATE MEDICAL CARE IF:   You have sudden, sharp, or persistent abdominal pain.  You have bloody or dark black, tarry stools.  You vomit blood or vomit that looks like coffee grounds.  You become light-headed, weak, or feel faint.  You become sweaty or clammy. MAKE SURE YOU:   Understand these instructions.  Will watch your condition.  Will get help right away if you are not doing well or get worse. Document Released: 01/24/2000 Document Revised: 06/12/2013 Document Reviewed: 08/26/2011 [facility] Patient Information 2015 [place], [place]. This information is not intended to replace advice given to you by your health care provider. Make sure you discuss any questions you have with your health care provider.

## 2014-09-11 NOTE — Progress Notes (Signed)
Internal Medicine Attending  Date: 09/11/2014  Patient name: Devin Hanna Medical record number: [id number] Date of birth: 07-Sep-1950 Age: 64 y.o. Gender: male  I saw and evaluated the patient. I reviewed the resident's note by Dr. Redmond Pulling and I agree with the resident's findings and plans as documented in his progress note.  Mr. Mintzer has had no further episodes of coffee-ground emesis, chest pain, or shortness of breath. He is scheduled to undergo an esophagogastroduodenoscopy tomorrow to assess for a possible source for the likely GI bleed as this would impact on his anti-platelet and PPI therapy moving forward. We appreciate GI's assistance. If no significant ulceration is found it is likely he will be ready for discharge home tomorrow afternoon.

## 2014-09-11 NOTE — Progress Notes (Signed)
Subjective: Devin Hanna was seen and examined this AM.  N/V resolved and he was able to eat some breakfast this morning.  He has not had another BM since the dark stool yesterday morning.  No CP or dyspnea.  Objective: Vital signs in last 24 hours: Filed Vitals:   09/11/14 0445 09/11/14 0545 09/11/14 0626 09/11/14 0738  BP: 107/50 93/40 113/50 104/43  Pulse: 67 65 70 70  Temp:    97.9 F (36.6 C)  TempSrc:    Oral  Resp: 15 15 19 18   Height:      Weight:      SpO2: 94% 97% 99% 96%   Weight change: 3.5 oz (0.1 kg)  Intake/Output Summary (Last 24 hours) at 09/11/14 1300 Last data filed at 09/11/14 0730  Gross per 24 hour  Intake    530 ml  Output    750 ml  Net   -220 ml   General: resting in bed, laying flat, NAD HEENT: [place]/AT Cardiac: RRR, no rubs, murmurs or gallops Pulm:  CTA, moving normal volumes of air Abd: soft, nontender, nondistended, BS present Ext: warm and well perfused, 1+ B/L pedal edema Neuro: alert and oriented X3, responding appropriately  Lab Results: Basic Metabolic Panel:  Recent Labs Lab 09/09/14 0853 09/10/14 0307 09/11/14 0459  NA 137 137 137  K 4.4 4.7 4.2  CL 100* 98* 101  CO2 23 27 25   GLUCOSE 175* 229* 129*  BUN 20 41* 71*  CREATININE 1.01 1.21 1.28*  CALCIUM 8.7* 8.6* 8.7*  MG 2.0  --   --   PHOS 3.1  --   --    Liver Function Tests:  Recent Labs Lab 09/08/14 0847 09/09/14 0853  AST 49* 52*  ALT 37 38  ALKPHOS 162* 122  BILITOT 1.1 1.0  PROT 6.7 5.9*  ALBUMIN 3.1* 3.0*   CBC:  Recent Labs Lab 09/08/14 0847  09/10/14 0307 09/10/14 1841 09/11/14 0459  WBC 8.8  < > 11.5* 18.0* 15.9*  NEUTROABS 6.0  --  8.2*  --   --   HGB 14.9  < > 12.7* 11.5* 11.6*  HCT 44.9  < > 38.4* 34.7* 35.1*  MCV 91.4  < > 92.3 92.0 90.0  PLT 225  < > 218 245 223  < > = values in this interval not displayed. Cardiac Enzymes:  Recent Labs Lab 09/09/14 0305 09/09/14 0853 09/09/14 2336  TROPONINI 0.32* 0.27* 0.21*   CBG:  Recent  Labs Lab 09/10/14 1220 09/10/14 1627 09/10/14 1643 09/10/14 2112 09/11/14 0711 09/11/14 1131  GLUCAP 173* 134* 126* 118* 147* 206*    Medications: I have reviewed the patient's current medications. Scheduled Meds: . aspirin EC  81 mg Oral Daily  . atorvastatin  40 mg Oral Daily  . clopidogrel  75 mg Oral Q breakfast  . enoxaparin (LOVENOX) injection  40 mg Subcutaneous Q24H  . famotidine (PEPCID) IV  20 mg Intravenous Q12H  . furosemide  20 mg Oral Daily  . gabapentin  100 mg Oral TID  . glipiZIDE  10 mg Oral Q breakfast  . insulin aspart  0-9 Units Subcutaneous TID WC  . quinapril  40 mg Oral Daily   Continuous Infusions: none PRN Meds:.calcium carbonate, nitroGLYCERIN   Assessment/Plan: Likely UGIB:  One episode of melena and dark vomit yesterday morning.  Hgb 14s --> 11s since admission.  No further blood loss noted today.  Patient feels better, N/V resolved.  Appreciate GI recommendations and intervention. -  AM CBC, he has been typed and crossed, transfuse if < 8 given cardiac hx - NPO at MN for possible colonoscopy tomorrow AM - GI recs for H2 blocker to be switched to Protonix since interaction with Plavix is usually with omeprazole  Acute respiratory failure with hypoxia:  Resolved.  He is laying flat and comfortable on room air.  This may have been due to HF exacerbation but EF is actually improved this admission to 5055%. - decrease Lasix to 86m daily - continue I/Os and daily weights - oxygen supplementation prn - outpatient PFTs  CAD:  stable - continue ASA, Plavix, BB, ACEI, statin  Lactic acidosis:  No signs of sepsis.  This may have been due to aggressive diuresis.   - decreasing Lasix as above  DM type 2 (diabetes mellitus, type 2):  CBG 147 this AM.  Holding metformin inpatient.  - continue glipizide and SSI-S - CBG ac/hs  HTN (hypertension):  BP low-nomal.  - decrease quinapril from 428mto 2042mtarting tomorrow - decreasing Lasix as above -  carvedilol held yesterday due to hypotension; monitoring BP, may be able to restart carvedilol at low dose  Dispo: Respiratory status improved.  Likely home tomorrow after colonoscopy.  The patient does have a current PCP (ChMarny LowensteiniMacario CarlsMD) and [facility].  The patient does not know have transportation limitations that hinder transportation to clinic appointments.  .Services Needed at time of discharge: Y = Yes, Blank = No PT:   OT:   RN:   Equipment:   Other:     LOS: 3 days   AleFrancesca OmanO 09/11/2014, 1:00 PM

## 2014-09-11 NOTE — Discharge Summary (Signed)
Name: Devin Hanna MRN: [id number] DOB: 08-Jan-1951 64 y.o. PCP: Devin Hanna., MD  Date of Admission: 09/08/2014  8:23 AM Date of Discharge: 09/12/2014 Attending Physician: Oval Linsey, MD  Discharge Diagnosis: 1. Acute Hypoxic Respiratory Failure secondary to Pulmonary Edema 2. CHF exacerbation 3. Acute Upper GI Bleed  Principal Problem:   Acute respiratory failure Active Problems:   GI bleed   CAD (coronary artery disease)   DM type 2 (diabetes mellitus, type 2)   HTN (hypertension)   Chronic systolic heart failure  Discharge Medications:   Medication List    STOP taking these medications        aspirin 81 MG EC tablet     omeprazole 20 MG tablet  Commonly known as:  PRILOSEC OTC      TAKE these medications        atorvastatin 40 MG tablet  Commonly known as:  LIPITOR  Take 1 tablet (40 mg total) by mouth daily.     calcium carbonate 500 MG chewable tablet  Commonly known as:  TUMS - dosed in mg elemental calcium  Chew 1 tablet by mouth as needed for indigestion or heartburn.     carvedilol 6.25 MG tablet  Commonly known as:  COREG  Take 1 tablet (6.25 mg total) by mouth 2 (two) times daily.     Cinnamon 500 MG capsule  Take 1,000 mg by mouth 2 (two) times daily with a meal.     clopidogrel 75 MG tablet  Commonly known as:  PLAVIX  Take 1 tablet (75 mg total) by mouth daily with breakfast.     furosemide 20 MG tablet  Commonly known as:  LASIX  Take 1 tablet (20 mg total) by mouth daily.     gabapentin 100 MG capsule  Commonly known as:  NEURONTIN  Take 100 mg by mouth 3 (three) times daily.     glipiZIDE 10 MG 24 hr tablet  Commonly known as:  GLUCOTROL XL  Take 10 mg by mouth daily with breakfast.     metFORMIN 1000 MG tablet  Commonly known as:  GLUCOPHAGE  Take 1,000 mg by mouth 2 (two) times daily with a meal.     multivitamin with minerals Tabs tablet  Take 1 tablet by mouth daily.     nitroGLYCERIN 0.4 MG SL tablet   Commonly known as:  NITROSTAT  Place 0.4 mg under the tongue every 5 (five) minutes as needed for chest pain.     pantoprazole 40 MG tablet  Commonly known as:  PROTONIX  Take 1 tablet (40 mg total) by mouth 2 (two) times daily.     quinapril 20 MG tablet  Commonly known as:  ACCUPRIL  Take 1 tablet (20 mg total) by mouth daily.        Disposition and follow-up:   Mr.Devin Hanna was discharged from [facility] in Good condition.  At the hospital follow up visit please address:  1.  CHF, management of fluid status with diuretics  2.  GI Bleed  3.  Labs / imaging needed at time of follow-up: CBC  4.  Pending labs/ test needing follow-up: None  Follow-up Appointments: Follow-up Information    Follow up with Devin Peters, MD. Schedule an appointment as soon as possible for a visit in 1 week.   Specialty:  Family Medicine   Why:  Hospital followup   Contact information:   [name]  [address] [phone]       Follow up with Devin Nipper, MD. Call in 1 month.   Specialty:  Gastroenterology   Why:  If you have not been contacted by the office, please call to arrange for follow-up and repeat EGD in 2 months.   Contact information:   [address] [place] [address] [phone]       Schedule an appointment as soon as possible for a visit with Devin [place], MD.   Specialty:  Cardiology   Why:  Hospital followup   Contact information:   [address] [place] [address] [phone]      Discharge Instructions: Discharge Instructions    (HEART FAILURE PATIENTS) Call MD:  Anytime you have any of the following symptoms: 1) 3 pound weight gain in 24 hours or 5 pounds in 1 week 2) shortness of breath, with or without a dry hacking cough 3) swelling in the hands, feet or stomach 4) if you have to sleep on extra pillows at night in order to breathe.    Complete by:  As directed       Call MD for:  difficulty breathing, headache or visual disturbances    Complete by:  As directed      Call MD for:  extreme fatigue    Complete by:  As directed      Call MD for:  hives    Complete by:  As directed      Call MD for:  persistant dizziness or light-headedness    Complete by:  As directed      Call MD for:  persistant nausea and vomiting    Complete by:  As directed      Call MD for:  redness, tenderness, or signs of infection (pain, swelling, redness, odor or green/yellow discharge around incision site)    Complete by:  As directed      Call MD for:  severe uncontrolled pain    Complete by:  As directed      Call MD for:  temperature >100.4    Complete by:  As directed      Diet - low sodium heart healthy    Complete by:  As directed      Increase activity slowly    Complete by:  As directed           You were hospitalized for acute respiratory failure which resulted from fluid accumulating in your lungs. This was most likely due to an acute change in your heart's ability to pump, which could have resulted from high blood pressures or under treatment with your fluid pills. While you were here, we evaluated you for a possible heart attack and feel that this is unlikely. The cardiologists recommended that your fluid pills (Lasix) be increased to 64m every day in order to prevent this in the future. You will follow-up with your cardiologist, Dr. AFletcher Hanna after leaving the hospital, who will continue to monitor this. Please weigh yourself every day in order to assess whether you begin to retain fluid in your body. If your weight begins to increase and you gain more that 3 pounds in 1-2 days, please notify your cardiologist in order to adjust your medications.   While you were in the hospital, you had an episode of bleeding from your gastrointestinal tract. The dark vomit and dark stools are a sign of blood, and we consulted the Gastroenterologists in order to investigate this  further. They preformed a video-scope procedure to view your esophagus, stomach, and the beginnings of your small intestines. This study revealed 3 benign appearing ulcers, consistent with aspirin-induced gastropathy, that are the likely source of your bleeding. You were started on an stomach acid reducing medication to help prevent future bleeding. This medication, pantoprozole (Protonix), is similar to the one your were taking previously, omeprazole (Prilosec), but it will be safer to take in conjunction with your Plavix. You will follow-up with the gastroenterologists after leaving the hospital. You should STOP taking your asprin for 4 weeks in order to help you stomach ulcers heal. After 4 weeks, you can resume taking your daily aspirin in compination with you Protonix acid reducing medication.  Consultations: Treatment Team:  Ronald Lobo, MD  Procedures Performed:  Dg Chest 2 View  09/09/2014   CLINICAL DATA:  Dyspnea  EXAM: CHEST  2 VIEW  COMPARISON:  Radiographs 09/08/2014  FINDINGS: Normal cardiac silhouette. There is chronic bronchitic markings. There is interval improvement in interstitial edema pattern seen on comparison exam. No pneumothorax. No focal infiltrate.  IMPRESSION: Improvement in interstitial edema pattern.   Electronically Signed   By: Suzy Bouchard M.D.   On: 09/09/2014 09:01   Dg Chest Port 1 View  09/08/2014   CLINICAL DATA:  Hypoxia and shortness of breath.  EXAM: PORTABLE CHEST - 1 VIEW  COMPARISON:  04/04/2013  FINDINGS: Cardiomegaly noted. Bilateral interstitial and airspace opacities likely represent moderate pulmonary edema.  There is no evidence of pneumothorax.  No large pleural effusions are identified.  No acute bony abnormalities are identified.  IMPRESSION: Bilateral interstitial and airspace opacities likely representing moderate pulmonary edema. Cardiomegaly again identified.   Electronically Signed   By: Margarette Canada M.D.   On: 09/08/2014 09:11    2D Echo:  Improved EF of 50-55%. Grade 1 diastolic dysfunction. No significant valvular disease. Unable to assess wall motion abnormalities.  Admission HPI:  Mr. ABRAHAM ENTWISTLE is a 64 y.o. male with a h/o of NYHA Class II Heart Failure, CAD, t2DM, HTN who presents with SOB and diaphoresis. He was found unresponsive by EMS with an initial pulse-ox of 50% that responded with Bi-Pap. Patient's EKG was non-specific with some possible ischemic ST depression in the lateral leads and CE were negative x2. Patient had a borderline BNP and mild lactic acidosis. CXR showed bilateral opacities suspicious for interstitial edema. He was stabilized in the ED and transferred to step-down for monitoring of respiratory status.  He reports that he was in his normal state of health until this morning when he got up to use the bathroom. Upon returning to his bed, he became acutely SOB and then described feeling chest tightness that was similar in quality to his MI last year. He is s/p MI w/ cath in Feb. 2015. He denies radiation of the pain and localizes it to the substernal region. He endorses a mild cough but denies any recent history of cough, fever, chills, and sweats. He endorses that his weight has been stable at home recently and he has some mild swelling in his BLE L>R (secondary to h/o trauma in RLE).  He reports that his home BP is well controlled and he takes his medications as prescribed. He denies any palpitations, or vomitting but does endorse some nausea. He denies any PND or orthopnea (but notes a 2 year h/o using 2 pillows for GERD sxs). Notes that he was previously recommended to use CPAP at night, but has not been  compliant with this.  He denies any changes to his vision or weakness/numbness in his extremeties. He does endorse a recent prescription of gabapentin for LE neuropathy, and notes that he only takes this BID (Rx for TID) due to increased sleepiness.  Hospital Course by problem list: Principal Problem:    Acute respiratory failure Active Problems:   GI bleed   CAD (coronary artery disease)   DM type 2 (diabetes mellitus, type 2)   HTN (hypertension)   Chronic systolic heart failure   1. Acute Hypoxic Respiratory Failure secondary to Pulmonary Edema Mr. Grunder was found unresponsive at his home with and O2 saturation of <50%. He was resucitated by EMS and recoved fully with BiPap. He was quickly transitioned to [place] oxygen and maintained his saturations for the remainder of his hospital stay. Upon arrival at the ED, troponins were initially negative, a small leak of 0.34 was noted ~24 hours after his first reported symptoms and continued to trend downward over the next 24 hours.  EKG showed some minor ST depression in the lateral leads which quickly resolved. Serial EKG were unremarkable.  He was also noted to have mild lactic acidosis which resolved by the time of discharge. Cardiology was consulted and determined that he was low risk for ischemic causes, recommending that a w/u for ACS be deferred at this time. He received a 2D echocardiogram which demonstrated improvement of his LVEF to 50-55% (previously 45-50%) and unchanged grade 1 diastolic dysfunction. He was treated with IV Lasix diuresis for 3 doses, transitioned to P.O. Lasix and titrated down to 40m P.O. Lasix daily. It was determined that he had achieved his dry weight after having lost ~2.3 kg. He was placed on a 2g sodium restricted diet and he will follow-up with cardiology to for further management of his fluid status and diuresis.  2.  Acute Upper GI Bleed On HD #3, he experienced a small amount of coffee-ground emesis in the morning and reported a dark melanotic stool later that day. He was noted to have low BPs that afternoon with MAPs in the 50s. He remained asymptomatic except for a mild decrease in his appetite and some nasea. His Hgb on admission was 14 which trended down to 11.5 at the time of the GI bleed. Hgb was stabilized at  ~11.5 at the time of discharge. GI was consulted and elected to preform an EGD with revealed 3 benign appearing ulcers, consistent with aspirin-induced gastropathy, that are the likely source of bleeding. It was determined that he was stable for discharge given the lack of bleeding for 24h. He was given pantoprazole 417mtwice daily for 4 weeks, which can then be reduced to 4030mnce daily. He will follow up with Gastroenterology in ~71mo33monthr further management of his GI bleeding and possible repeat endoscopy. GI recommends that his ASA therapy be held for 2-4 weeks in order to promote ulcer healing. Given his concomitant anti-platelet therapy with Plavix it was decided with the input of Cardiology to hold his ASA for 4 weeks. After this interval, cardiology will recommend further anti-platelet therapy. If ASA therapy is restarted, GI recommends lifelong PPI prophylaxis.  (Of note, he had previously taken omeprazole at home PRN GERD symptoms and was on Plavix for anti-platelet therapy in the setting of his stents. Given the potential for reduced efficacy of Plavix with omeprazole, he was transitioned to pantoprazole for PPI coverage.)  Discharge Vitals:   BP 129/65 mmHg  Pulse 72  Temp(Src) 98.2 F (36.8  C) (Oral)  Resp 15  Ht 5' 7"  (1.702 m)  Wt 106.505 kg (234 lb 12.8 oz)  BMI 36.77 kg/m2  SpO2 98%  Discharge Labs:  Results for orders placed or performed during the hospital encounter of 09/08/14 (from the past 24 hour(s))  Glucose, capillary     Status: Abnormal   Collection Time: 09/11/14  4:09 PM  Result Value Ref Range   Glucose-Capillary 139 (H) 65 - 99 mg/dL  Glucose, capillary     Status: Abnormal   Collection Time: 09/11/14  9:49 PM  Result Value Ref Range   Glucose-Capillary 169 (H) 65 - 99 mg/dL  CBC Once     Status: Abnormal   Collection Time: 09/12/14  3:47 AM  Result Value Ref Range   WBC 10.4 4.0 - 10.5 K/uL   RBC 3.37 (L) 4.22 - 5.81 MIL/uL   Hemoglobin 10.0 (L) 13.0  - 17.0 g/dL   HCT 30.3 (L) 39.0 - 52.0 %   MCV 89.9 78.0 - 100.0 fL   MCH 29.7 26.0 - 34.0 pg   MCHC 33.0 30.0 - 36.0 g/dL   RDW 16.3 (H) 11.5 - 15.5 %   Platelets 204 150 - 400 K/uL  Basic metabolic panel     Status: Abnormal   Collection Time: 09/12/14  3:47 AM  Result Value Ref Range   Sodium 140 135 - 145 mmol/L   Potassium 3.7 3.5 - 5.1 mmol/L   Chloride 104 101 - 111 mmol/L   CO2 28 22 - 32 mmol/L   Glucose, Bld 127 (H) 65 - 99 mg/dL   BUN 38 (H) 6 - 20 mg/dL   Creatinine, Ser 1.24 0.61 - 1.24 mg/dL   Calcium 8.3 (L) 8.9 - 10.3 mg/dL   GFR calc non Af Amer 59 (L) >60 mL/min   GFR calc Af Amer >60 >60 mL/min   Anion gap 8 5 - 15  Glucose, capillary     Status: Abnormal   Collection Time: 09/12/14  7:39 AM  Result Value Ref Range   Glucose-Capillary 127 (H) 65 - 99 mg/dL  Glucose, capillary     Status: Abnormal   Collection Time: 09/12/14 11:28 AM  Result Value Ref Range   Glucose-Capillary 161 (H) 65 - 99 mg/dL    Signed: Effie Berkshire, Med Student 09/12/2014, 12:55 PM    Services Ordered on Discharge: None Equipment Ordered on Discharge: None

## 2014-09-11 NOTE — Progress Notes (Signed)
Subjective:    Currently, the patient is feeling well. He has no complaints with is breathing and endorses that he is feeling more awake today. His nausea from yesterday has improved. He denies any N/V or BMs since yesterday.  Interval Events: Yesterday evening, the pts daughter reported that emesis yesterday morning was dark and coffee ground appearing, patient also endorses 1 episode of dark loose stool. His BPs were noted to be low in the high 80s to low 97X systolic with MAPs in the 50s. He was asymptomatic and endorsed feeling well except for some nausea and decreased appetite. A STAT CBC was obtained that demonstrated downtrending Hgb at 11.5 (admission Hgb of 14). Repeat Hgb today at 11.6. Leukocytosis spiked last night to 18, has downtrended this morning to 15.5.   Objective:    Vital Signs:   Temp:  [97.9 F (36.6 C)-98.8 F (37.1 C)] 97.9 F (36.6 C) (08/02 0738) Pulse Rate:  [64-81] 70 (08/02 0738) Resp:  [15-21] 18 (08/02 0738) BP: (76-113)/(36-55) 104/43 mmHg (08/02 0738) SpO2:  [82 %-99 %] 96 % (08/02 0738) Weight:  [106.6 kg (235 lb 0.2 oz)] 106.6 kg (235 lb 0.2 oz) (08/02 0415) Last BM Date: 09/10/14  24-hour weight change: Weight change: 0.1 kg (3.5 oz)  Intake/Output:   Intake/Output Summary (Last 24 hours) at 09/11/14 1556 Last data filed at 09/11/14 0730  Gross per 24 hour  Intake    410 ml  Output    750 ml  Net   -340 ml      Physical Exam: General: Vital signs reviewed and noted. Well-developed, well-nourished, in no acute distress; alert, appropriate and cooperative throughout examination.  Lungs:  Normal respiratory effort. Fine crackles bil throughout, improved from previous, no rales, no wheeze.  Heart: RRR. S1 and S2 normal without gallop, murmur, or rubs.  Abdomen:  BS normoactive. Obese, soft, nondistended, non-tender.  No masses or organomegaly. No CVA tenderness.  Extremities: No pretibial edema     Labs:  Basic Metabolic  Panel:  Recent Labs Lab 09/08/14 0847 09/09/14 0853 09/10/14 0307 09/11/14 0459  NA 137 137 137 137  K 5.1 4.4 4.7 4.2  CL 105 100* 98* 101  CO2 21* 23 27 25   GLUCOSE 276* 175* 229* 129*  BUN 9 20 41* 71*  CREATININE 1.07 1.01 1.21 1.28*  CALCIUM 8.4* 8.7* 8.6* 8.7*  MG  --  2.0  --   --   PHOS  --  3.1  --   --     Liver Function Tests:  Recent Labs Lab 09/08/14 0847 09/09/14 0853  AST 49* 52*  ALT 37 38  ALKPHOS 162* 122  BILITOT 1.1 1.0  PROT 6.7 5.9*  ALBUMIN 3.1* 3.0*   CBC:  Recent Labs Lab 09/08/14 0847 09/09/14 0305 09/10/14 0307 09/10/14 1841 09/11/14 0459  WBC 8.8 14.2* 11.5* 18.0* 15.9*  NEUTROABS 6.0  --  8.2*  --   --   HGB 14.9 13.5 12.7* 11.5* 11.6*  HCT 44.9 40.1 38.4* 34.7* 35.1*  MCV 91.4 89.9 92.3 92.0 90.0  PLT 225 197 218 245 223    Cardiac Enzymes:  Recent Labs Lab 09/08/14 0847 09/08/14 2054 09/09/14 0305 09/09/14 0853 09/09/14 2336  TROPONINI 0.03 0.34* 0.32* 0.27* 0.21*   BNP" No results for input(s): BNP in the last 72 hours.  CBG:  Recent Labs Lab 09/10/14 1627 09/10/14 1643 09/10/14 2112 09/11/14 0711 09/11/14 1131  GLUCAP 134* 126* 118* 147* 206*    Microbiology:  Results for orders placed or performed during the hospital encounter of 09/08/14  MRSA PCR Screening     Status: None   Collection Time: 09/08/14 12:33 PM  Result Value Ref Range Status   MRSA by PCR NEGATIVE NEGATIVE Final    Comment:        The GeneXpert MRSA Assay (FDA approved for NASAL specimens only), is one component of a comprehensive MRSA colonization surveillance program. It is not intended to diagnose MRSA infection nor to guide or monitor treatment for MRSA infections.     Other results: EKG: normal sinus rhythm, LBBB, occasional PVCs.  Imaging: No results found.    Medications:    Infusions:    Scheduled Medications: . aspirin EC  81 mg Oral Daily  . atorvastatin  40 mg Oral Daily  . clopidogrel  75 mg Oral  Q breakfast  . enoxaparin (LOVENOX) injection  40 mg Subcutaneous Q24H  . furosemide  20 mg Oral Daily  . gabapentin  100 mg Oral TID  . glipiZIDE  10 mg Oral Q breakfast  . insulin aspart  0-9 Units Subcutaneous TID WC  . [START ON 09/12/2014] lisinopril  20 mg Oral Daily  . pantoprazole  40 mg Oral BID WC    PRN Medications: calcium carbonate, nitroGLYCERIN   Assessment/ Plan:    Pt is a 64 y.o. yo male with a PMHx of CHF (EF 45% Aug. 2015), CAD (s/p MI Feb. 2015), HTN, and t2DM who was admitted on 09/08/2014 with symptoms of respiratory distress, which was determined to be secondary to pulmonary edema. Interventions at this time will be focused on diuresis.   1) Acute hypoxemic respiratory distress 2/2 pulmonary edema: Resolved. Consistent with CHF exacerbation. Plan to reduce diuresis to P.O. -- Cardiology consulted appreciate recs:  - Echo demonstrated EF 50-55% (improved from previous at 45-50%), grade 1 diastolic dysfx, no significant valvular disease, unable to assess WMA  - No ACS w/u at this time -- Sating well on RA -- Continue home BB, and statin -- decrease quinapril to 69m qD for soft BPs -- 261mPO Lasix qD -- Strict I/Os, daily wts -- 2g sodium restricted diet -- Discontinue tele  2) CAD: Stable. Cardiology consulted as above, no ischemic w/u. -- home meds as above -- Continue ASA and Plavix -- Nitro PRN CP  3) GI bleeding: Reports 1 episode of coffee-ground emesis (~2006mand 1 episode of dark melanotic stool yesterday. Soft BPs. Currently asymptomatic. No diarrhea, no history of GI bleed. Has h/o GERD x 2+ years and takes home omeprazole PRN. No h/o NSAID abuse, no alcohol abuse. Currently asymptomatic. -- GI consulted, appreciate recs -- start pantoprazole 19m30mD  -- avoid omeprazole due to possible drug interaction with clopidogrel -- Trend CBC, plan to transfuse for Hgb <8 due to CAD  3) Leukocytosis: WBC spike to 18 yesterday PM. Possibly due to acute  GI hemorrhage, no other infectious etiology clear. -- Continue to monitor CBC  4) Lactic acidosis: 4.7 on admission, downtrended to 2.7, rebounded to 4.6 yesterday. Sepsis unlikely given lack of systemic inflammatory symptoms. Transient ischemic spike possible 2/2 aggressive diuresis. Continue to monitor.  5) Asymptomatic Bacteruria: Denies dysuria. No indication for abx currently. Continue to monitor.  6) HTN: Stable -- Continue home meds as above  7) t2DM: BG elevated at 276. -- Restart home glipizide XL 10mg77m -- SSI w/ meals as needed -- Gabapentin 300mg 76mfor neuropathy  8) GERD: -- Continue home omeprazole -- [employer]  PRN  FEN/GI: -- Heart healthy diet, 2g sodium restriction -- No IVF -- Strict I/Os -- Encourage PO hydration   DVT PPX - low molecular weight heparin  CODE STATUS - Full  CONSULTS PLACED - Cards, GI  DISPO - Disposition pending GI recommendation for GI bleed.    Anticipated discharge in approximately 1 day(s).   The patient does have a current PCP (PHILLIPS JR, Marny Lowenstein, MD) and does need an [facility] hospital follow-up appointment after discharge.    Is the [facility] hospital follow-up appointment a one-time only appointment? no.  Does the patient have transportation limitations that hinder transportation to clinic appointments? no   SERVICE NEEDED AT [place]         Y = Yes, Blank = No PT:   OT:   RN:   Equipment:   Other:     Length of Stay: 3 day(s)  This is a Careers information officer Note.  The care of the patient was discussed with Dr. Naaman Plummer and the assessment and plan formulated with their assistance.  Please see their attached note for official documentation of the daily encounter.  Effie Berkshire, MS4 Pager: [phone] (7AM-5PM) 09/11/2014, 3:56 PM

## 2014-09-12 ENCOUNTER — Inpatient Hospital Stay (HOSPITAL_COMMUNITY): Payer: BLUE CROSS/BLUE SHIELD | Admitting: Anesthesiology

## 2014-09-12 ENCOUNTER — Encounter (HOSPITAL_COMMUNITY): Payer: Self-pay | Admitting: *Deleted

## 2014-09-12 ENCOUNTER — Encounter (HOSPITAL_COMMUNITY)
Admission: EM | Disposition: A | Discharge status: Home / Self Care | Payer: Self-pay | Source: Home / Self Care | Attending: Internal Medicine

## 2014-09-12 DIAGNOSIS — K279 Peptic ulcer, site unspecified, unspecified as acute or chronic, without hemorrhage or perforation: Secondary | ICD-10-CM

## 2014-09-12 DIAGNOSIS — K270 Acute peptic ulcer, site unspecified, with hemorrhage: Secondary | ICD-10-CM

## 2014-09-12 HISTORY — PX: ESOPHAGOGASTRODUODENOSCOPY (EGD) WITH PROPOFOL: SHX5813

## 2014-09-12 LAB — CBC
HCT: 30.3 % — ABNORMAL LOW (ref 39.0–52.0)
Hemoglobin: 10 g/dL — ABNORMAL LOW (ref 13.0–17.0)
MCH: 29.7 pg (ref 26.0–34.0)
MCHC: 33 g/dL (ref 30.0–36.0)
MCV: 89.9 fL (ref 78.0–100.0)
PLATELETS: 204 10*3/uL (ref 150–400)
RBC: 3.37 MIL/uL — ABNORMAL LOW (ref 4.22–5.81)
RDW: 16.3 % — ABNORMAL HIGH (ref 11.5–15.5)
WBC: 10.4 10*3/uL (ref 4.0–10.5)

## 2014-09-12 LAB — BASIC METABOLIC PANEL
Anion gap: 8 (ref 5–15)
BUN: 38 mg/dL — ABNORMAL HIGH (ref 6–20)
CALCIUM: 8.3 mg/dL — AB (ref 8.9–10.3)
CO2: 28 mmol/L (ref 22–32)
Chloride: 104 mmol/L (ref 101–111)
Creatinine, Ser: 1.24 mg/dL (ref 0.61–1.24)
GFR calc Af Amer: >60 mL/min (ref >60)
GFR, EST NON AFRICAN AMERICAN: 59 mL/min — AB (ref >60)
Glucose, Bld: 127 mg/dL — ABNORMAL HIGH (ref 65–99)
POTASSIUM: 3.7 mmol/L (ref 3.5–5.1)
Sodium: 140 mmol/L (ref 135–145)

## 2014-09-12 LAB — GLUCOSE, CAPILLARY
GLUCOSE-CAPILLARY: 127 mg/dL — AB (ref 65–99)
Glucose-Capillary: 161 mg/dL — ABNORMAL HIGH (ref 65–99)

## 2014-09-12 SURGERY — ESOPHAGOGASTRODUODENOSCOPY (EGD) WITH PROPOFOL
Anesthesia: Monitor Anesthesia Care

## 2014-09-12 MED ORDER — FUROSEMIDE 20 MG PO TABS: 20.0000 mg | ORAL_TABLET | Freq: Every day | ORAL | Status: DC

## 2014-09-12 MED ORDER — HYDROMORPHONE HCL 1 MG/ML IJ SOLN: 0.2500 mg | INTRAMUSCULAR | Status: DC | PRN

## 2014-09-12 MED ORDER — LACTATED RINGERS IV SOLN
INTRAVENOUS | Status: DC
  Administered 2014-09-12: 1000 mL via INTRAVENOUS

## 2014-09-12 MED ORDER — LACTATED RINGERS IV SOLN
INTRAVENOUS | Status: DC | PRN
  Administered 2014-09-12: 08:00:00 via INTRAVENOUS

## 2014-09-12 MED ORDER — MEPERIDINE HCL 25 MG/ML IJ SOLN: 6.2500 mg | INTRAMUSCULAR | Status: DC | PRN

## 2014-09-12 MED ORDER — ONDANSETRON HCL 4 MG/2ML IJ SOLN: 4.0000 mg | Freq: Once | INTRAMUSCULAR | Status: DC | PRN

## 2014-09-12 MED ORDER — PANTOPRAZOLE SODIUM 40 MG PO TBEC: 40.0000 mg | DELAYED_RELEASE_TABLET | Freq: Two times a day (BID) | ORAL | Status: DC

## 2014-09-12 MED ORDER — SODIUM CHLORIDE 0.9 % IV SOLN: INTRAVENOUS | Status: DC

## 2014-09-12 MED ORDER — PROPOFOL INFUSION 10 MG/ML OPTIME
INTRAVENOUS | Status: DC | PRN
  Administered 2014-09-12: 120 ug/kg/min via INTRAVENOUS

## 2014-09-12 MED ORDER — BUTAMBEN-TETRACAINE-BENZOCAINE 2-2-14 % EX AERO
INHALATION_SPRAY | CUTANEOUS | Status: DC | PRN
  Administered 2014-09-12: 2 via TOPICAL

## 2014-09-12 MED ORDER — QUINAPRIL HCL 20 MG PO TABS: 20.0000 mg | ORAL_TABLET | Freq: Every day | ORAL | Status: DC

## 2014-09-12 MED ORDER — CARVEDILOL 6.25 MG PO TABS: 6.2500 mg | ORAL_TABLET | Freq: Two times a day (BID) | ORAL | Status: DC

## 2014-09-12 NOTE — Anesthesia Preprocedure Evaluation (Addendum)
Anesthesia Evaluation  Patient identified by MRN, date of birth, ID band Patient awake    Reviewed: Allergy & Precautions, NPO status , Patient's Chart, lab work & pertinent test results  History of Anesthesia Complications Negative for: history of anesthetic complications  Airway Mallampati: II  TM Distance: >3 FB Neck ROM: Full    Dental   Pulmonary former smoker,  Recent flash pulmonary edema.  Resolving.  CXR = improving interstitial edema   Pulmonary exam normal       Cardiovascular hypertension, + CAD, + Past MI and + Cardiac Stents Normal cardiovascular exam    Neuro/Psych negative neurological ROS  negative psych ROS   GI/Hepatic Neg liver ROS, R/O GI Bleed   Endo/Other  diabetes, Type 2, Oral Hypoglycemic Agents  Renal/GU      Musculoskeletal   Abdominal   Peds  Hematology negative hematology ROS (+)   Anesthesia Other Findings   Reproductive/Obstetrics                            Anesthesia Physical Anesthesia Plan  ASA: III  Anesthesia Plan: MAC   Post-op Pain Management:    Induction: Intravenous  Airway Management Planned: Nasal Cannula  Additional Equipment:   Intra-op Plan:   Post-operative Plan:   Informed Consent: I have reviewed the patients History and Physical, chart, labs and discussed the procedure including the risks, benefits and alternatives for the proposed anesthesia with the patient or authorized representative who has indicated his/her understanding and acceptance.     Plan Discussed with: CRNA and Surgeon  Anesthesia Plan Comments:        Anesthesia Quick Evaluation

## 2014-09-12 NOTE — Progress Notes (Signed)
Subjective:    No complaints today. Feeling well and ready to go home. Tolerated his EGD this morning well.  Interval Events: EGD this AM. No active bleeding. 3 gastric ulcers. Hgb continued to downtrend to 10.0. Renal function normalizing.   Objective:    Vital Signs:   Temp:  [98.1 F (36.7 C)-98.7 F (37.1 C)] 98.2 F (36.8 C) (08/03 0843) Pulse Rate:  [66-74] 72 (08/03 0900) Resp:  [14-24] 15 (08/03 0900) BP: (92-152)/(46-70) 129/65 mmHg (08/03 0900) SpO2:  [96 %-100 %] 98 % (08/03 0900) Weight:  [106.505 kg (234 lb 12.8 oz)] 106.505 kg (234 lb 12.8 oz) (08/03 0500) Last BM Date: 09/10/14  24-hour weight change: Weight change: -0.096 kg (-3.4 oz)  Intake/Output:   Intake/Output Summary (Last 24 hours) at 09/12/14 1321 Last data filed at 09/12/14 1220  Gross per 24 hour  Intake    680 ml  Output    950 ml  Net   -270 ml      Physical Exam: General: Vital signs reviewed and noted. Well-developed, well-nourished, in no acute distress; alert, appropriate and cooperative throughout examination.  Lungs:  Normal respiratory effort. Fine crackles bil throughout, no rales, no wheeze.  Heart: RRR. S1 and S2 normal without gallop, murmur, or rubs.  Abdomen:  BS normoactive. Obese, soft, nondistended, non-tender.  No masses or organomegaly. No CVA tenderness.  Extremities: No pretibial edema     Labs:  Basic Metabolic Panel:  Recent Labs Lab 09/08/14 0847 09/09/14 0853 09/10/14 0307 09/11/14 0459 09/12/14 0347  NA 137 137 137 137 140  K 5.1 4.4 4.7 4.2 3.7  CL 105 100* 98* 101 104  CO2 21* 23 27 25 28   GLUCOSE 276* 175* 229* 129* 127*  BUN 9 20 41* 71* 38*  CREATININE 1.07 1.01 1.21 1.28* 1.24  CALCIUM 8.4* 8.7* 8.6* 8.7* 8.3*  MG  --  2.0  --   --   --   PHOS  --  3.1  --   --   --     Liver Function Tests:  Recent Labs Lab 09/08/14 0847 09/09/14 0853  AST 49* 52*  ALT 37 38  ALKPHOS 162* 122  BILITOT 1.1 1.0  PROT 6.7 5.9*  ALBUMIN 3.1*  3.0*   CBC:  Recent Labs Lab 09/08/14 0847 09/09/14 0305 09/10/14 0307 09/10/14 1841 09/11/14 0459 09/12/14 0347  WBC 8.8 14.2* 11.5* 18.0* 15.9* 10.4  NEUTROABS 6.0  --  8.2*  --   --   --   HGB 14.9 13.5 12.7* 11.5* 11.6* 10.0*  HCT 44.9 40.1 38.4* 34.7* 35.1* 30.3*  MCV 91.4 89.9 92.3 92.0 90.0 89.9  PLT 225 197 218 245 223 204    Cardiac Enzymes:  Recent Labs Lab 09/08/14 0847 09/08/14 2054 09/09/14 0305 09/09/14 0853 09/09/14 2336  TROPONINI 0.03 0.34* 0.32* 0.27* 0.21*   BNP" No results for input(s): BNP in the last 72 hours.  CBG:  Recent Labs Lab 09/11/14 1131 09/11/14 1609 09/11/14 2149 09/12/14 0739 09/12/14 1128  GLUCAP 206* 139* 169* 127* 161*    Microbiology: Results for orders placed or performed during the hospital encounter of 09/08/14  MRSA PCR Screening     Status: None   Collection Time: 09/08/14 12:33 PM  Result Value Ref Range Status   MRSA by PCR NEGATIVE NEGATIVE Final    Comment:        The GeneXpert MRSA Assay (FDA approved for NASAL specimens only), is one component of a comprehensive  MRSA colonization surveillance program. It is not intended to diagnose MRSA infection nor to guide or monitor treatment for MRSA infections.     Other results: EGD: 3 benign appearing ulcers, consistent with aspirin-induced gastropathy, that are the likely source of bleeding, stigmata of recent bleed, no active hemorrhage. See Dr. Osborn Coho OpNote from 09/11/2014 for full details.  Imaging: No results found.    Medications:    Infusions:    Scheduled Medications: . atorvastatin  40 mg Oral Daily  . clopidogrel  75 mg Oral Q breakfast  . enoxaparin (LOVENOX) injection  40 mg Subcutaneous Q24H  . furosemide  20 mg Oral Daily  . gabapentin  100 mg Oral TID  . glipiZIDE  10 mg Oral Q breakfast  . insulin aspart  0-9 Units Subcutaneous TID WC  . lisinopril  20 mg Oral Daily  . pantoprazole  40 mg Oral BID WC    PRN  Medications: calcium carbonate, HYDROmorphone (DILAUDID) injection, meperidine (DEMEROL) injection, nitroGLYCERIN, ondansetron (ZOFRAN) IV   Assessment/ Plan:    Pt is a 64 y.o. yo male with a PMHx of CHF (EF 45% Aug. 2015), CAD (s/p MI Feb. 2015), HTN, and t2DM who was admitted on 66/30/2016 with symptoms of respiratory distress, which was determined to be secondary to pulmonary edema. Interventions at this time will be focused on diuresis.   1) Acute hypoxemic respiratory distress 2/2 pulmonary edema: Resolved. CHF exacerbation vs demand ischemia. -- Cardiology consulted appreciate recs:  - Echo demonstrated EF 50-55% (improved from previous at 45-50%), grade 1 diastolic dysfx, no significant valvular disease, unable to assess WMA  - No ACS w/u at this time  - f/u with cardiology outpt to discuss future anti-platlet therapy in the setting of recent cardiac hospitalization and new GI bleeding -- Continue home BB, and statin -- decrease home quinapril to 75m qD for hypotension -- 218mPO Lasix qD -- Daily wts at home -- 2g sodium restricted diet at home -- D/C home today with cards f/u  2) GI bleeding: Stable. No further evidence of bleeding x 24h. EGD with GI today revealing 3 gastric ulcers with stigmata of recent bleeding. Hgb now 10.0. h/o GERD x 2+ years and takes home omeprazole PRN. No h/o NSAID abuse, no alcohol abuse. On daily ASA. Currently asymptomatic. -- GI consulted, appreciate recs:  -- f/u EGD in 2 months to assess for healing  -- hold ASA x 4 wks  -- pantoprazole 405mID x 4 wks, then qD indefinitely for prophylaxis if ASA therapy is continued -- avoid omeprazole due to possible drug interaction with clopidogrel  3) CAD: Stable. h/o MI with stenting. Trop leak resolving, likely secondary to ischemic demand. Today EKG w/ PVCs, unchanged otherwise. Cardiology consulted, no ischemic w/u. -- ACE-I, BB, statin as above -- Continue Plavix -- dc ASA x 4wks in the  setting of GI bleed, restart in consultation with outpt cards -- Nitro PRN CP  4) Leukocytosis: WBC normalized.  5) Lactic acidosis: Stable.  6) Asymptomatic Bacteruria: Denies dysuria. No indication for abx currently.  7) HTN: Stable -- Continue home meds as above  8) t2DM: BG elevated at 276. -- Home glipizide XL 18m56m. -- Gabapentin 100mg70m for neuropathy  8) GERD: -- Continue home omeprazole -- TUMS PRN  FEN/GI: -- 2g sodium restriction diet -- daily wts    DVT PPX - low molecular weight heparin  CODE STATUS - Full  CONSULTS PLACED - Cards, GI  DISPO - d/c home  today with f/u for Cardiology and GI  The patient does have a current PCP (PHILLIPS JR, Marny Lowenstein, MD) and does need an [facility] hospital follow-up appointment after discharge.    Is the [facility] hospital follow-up appointment a one-time only appointment? no.  Does the patient have transportation limitations that hinder transportation to clinic appointments? no   SERVICE NEEDED AT [place]         Y = Yes, Blank = No PT:   OT:   RN:   Equipment:   Other:     Length of Stay: 4 day(s)  This is a Careers information officer Note.  The care of the patient was discussed with Dr. Naaman Plummer and the assessment and plan formulated with their assistance.  Please see their attached note for official documentation of the daily encounter.  Effie Berkshire, MS4 Pager: [phone] (7AM-5PM) 09/12/2014, 1:21 PM

## 2014-09-12 NOTE — Anesthesia Postprocedure Evaluation (Signed)
Anesthesia Post Note  Patient: Devin Hanna Husband  Procedure(s) Performed: Procedure(s) (LRB): ESOPHAGOGASTRODUODENOSCOPY (EGD) WITH PROPOFOL (N/A)  Anesthesia type: MAC  Patient location: PACU  Post pain: Pain level controlled  Post assessment: Patient's Cardiovascular Status Stable  Last Vitals:  Filed Vitals:   09/12/14 0900  BP: 129/65  Pulse: 72  Temp:   Resp: 15    Post vital signs: Reviewed and stable  Level of consciousness: sedated  Complications: No apparent anesthesia complications

## 2014-09-12 NOTE — Discharge Summary (Signed)
Patient Name:  Devin Hanna  MRN: [id number]  PCP: [name], MD  DOB:  08/22/1950       Date of Admission:  09/08/2014  Date of Discharge:  09/12/2014      Attending Physician: Dr. Oval Linsey         DISCHARGE DIAGNOSES: 1. Acute Hypoxic Respiratory Failure secondary to Pulmonary Edema 2. Acute Upper GI Bleed secondary to peptic ulcers  DISPOSITION AND FOLLOW-UP: Devin Hanna is to follow-up with the listed providers as detailed below, at patient's visiting, please address following issues:  Devin Hanna was discharged from [facility] in Good condition. At the hospital follow up visit please address:  1. CHF, management of fluid status with diuretics; titrate BB and ACEI up  2. GI Bleed; compliance with protonix, avoidance of omeprazole  3. Labs / imaging needed at time of follow-up: CBC  4. Pending labs/ test needing follow-up: None  Follow-up Information    Follow up with [name], MD. Schedule an appointment as soon as possible for a visit in 1 week.   Specialty:  Family Medicine   Why:  Hospital followup   Contact information:   [address] [place] [address] [phone]       Follow up with [name], MD. Call in 1 month.   Specialty:  Gastroenterology   Why:  If you have not been contacted by the office, please call to arrange for follow-up and repeat EGD in 2 months.   Contact information:   [address] [place] [address] [phone]       Schedule an appointment as soon as possible for a visit with [name], MD.   Specialty:  Cardiology   Why:  Hospital followup   Contact information:   [address] [place] [address] [phone]      Discharge Instructions    (HEART FAILURE PATIENTS) Call MD:  Anytime you have any of the following symptoms: 1) 3 pound weight gain in 24 hours or 5 pounds in 1 week 2) shortness of breath, with or  without a dry hacking cough 3) swelling in the hands, feet or stomach 4) if you have to sleep on extra pillows at night in order to breathe.    Complete by:  As directed      Call MD for:  difficulty breathing, headache or visual disturbances    Complete by:  As directed      Call MD for:  extreme fatigue    Complete by:  As directed      Call MD for:  hives    Complete by:  As directed      Call MD for:  persistant dizziness or light-headedness    Complete by:  As directed      Call MD for:  persistant nausea and vomiting    Complete by:  As directed      Call MD for:  redness, tenderness, or signs of infection (pain, swelling, redness, odor or green/yellow discharge around incision site)    Complete by:  As directed      Call MD for:  severe uncontrolled pain    Complete by:  As directed      Call MD for:  temperature >100.4    Complete by:  As directed      Diet - low sodium heart healthy    Complete by:  As directed      Increase activity slowly    Complete by:  As directed             DISCHARGE MEDICATIONS:   Medication List    STOP taking these medications        aspirin 81 MG EC tablet     omeprazole 20 MG tablet  Commonly known as:  PRILOSEC OTC      TAKE these medications        atorvastatin 40 MG tablet  Commonly known as:  LIPITOR  Take 1 tablet (40 mg total) by mouth daily.     calcium carbonate 500 MG chewable tablet  Commonly known as:  TUMS - dosed in mg elemental calcium  Chew 1 tablet by mouth as needed for indigestion or heartburn.     carvedilol 6.25 MG tablet  Commonly known as:  COREG  Take 1 tablet (6.25 mg total) by mouth 2 (two) times daily.     Cinnamon 500 MG capsule  Take 1,000 mg by mouth 2 (two) times daily with a meal.     clopidogrel 75 MG tablet  Commonly known as:  PLAVIX  Take 1 tablet (75 mg total) by mouth daily with breakfast.     furosemide 20 MG tablet  Commonly known as:  LASIX  Take 1 tablet (20 mg total) by mouth  daily.     gabapentin 100 MG capsule  Commonly known as:  NEURONTIN  Take 100 mg by mouth 3 (three) times daily.     glipiZIDE 10 MG 24 hr tablet  Commonly known as:  GLUCOTROL XL  Take 10 mg by mouth daily with breakfast.     metFORMIN 1000 MG tablet  Commonly known as:  GLUCOPHAGE  Take 1,000 mg by mouth 2 (two) times daily with a meal.     multivitamin with minerals Tabs tablet  Take 1 tablet by mouth daily.     nitroGLYCERIN 0.4 MG SL tablet  Commonly known as:  NITROSTAT  Place 0.4 mg under the tongue every 5 (five) minutes as needed for chest pain.     pantoprazole 40 MG tablet  Commonly known as:  PROTONIX  Take 1 tablet (40 mg total) by mouth 2 (two) times daily.     quinapril 20 MG tablet  Commonly known as:  ACCUPRIL  Take 1 tablet (20 mg total) by mouth daily.         CONSULTS:  Cardiology Gastroenterology   PROCEDURES PERFORMED:  Dg Chest 2 View  09/09/2014   CLINICAL DATA:  Dyspnea  EXAM: CHEST  2 VIEW  COMPARISON:  Radiographs 09/08/2014  FINDINGS: Normal cardiac silhouette. There is chronic bronchitic markings. There is interval improvement in interstitial edema pattern seen on comparison exam. No pneumothorax. No focal infiltrate.  IMPRESSION: Improvement in interstitial edema pattern.   Electronically Signed   By: Suzy Bouchard M.D.   On: 09/09/2014 09:01   Dg Chest Port 1 View  09/08/2014   CLINICAL DATA:  Hypoxia and shortness of breath.  EXAM: PORTABLE CHEST - 1 VIEW  COMPARISON:  04/04/2013  FINDINGS: Cardiomegaly noted. Bilateral interstitial and airspace opacities likely represent moderate pulmonary edema.  There is no evidence of pneumothorax.  No large pleural effusions are identified.  No acute bony abnormalities are identified.  IMPRESSION: Bilateral interstitial and airspace opacities likely representing moderate pulmonary edema. Cardiomegaly again identified.   Electronically Signed   By: Margarette Canada M.D.   On: 09/08/2014 09:11  ADMISSION DATA: Admission HPI:  Devin Hanna is a 64 y.o. male with a h/o of NYHA Class II Heart Failure, CAD, t2DM, HTN who presents with SOB and diaphoresis. He was found unresponsive by EMS with an initial pulse-ox of 50% that responded with Bi-Pap. Patient's EKG was non-specific with some possible ischemic ST depression in the lateral leads and CE were negative x2. Patient had a borderline BNP and mild lactic acidosis. CXR showed bilateral opacities suspicious for interstitial edema. He was stabilized in the ED and transferred to step-down for monitoring of respiratory status.  He reports that he was in his normal state of health until this morning when he got up to use the bathroom. Upon returning to his bed, he became acutely SOB and then described feeling chest tightness that was similar in quality to his MI last year. He is s/p MI w/ cath in Feb. 2015. He denies radiation of the pain and localizes it to the substernal region. He endorses a mild cough but denies any recent history of cough, fever, chills, and sweats. He endorses that his weight has been stable at home recently and he has some mild swelling in his BLE L>R (secondary to h/o trauma in RLE).  He reports that his home BP is well controlled and he takes his medications as prescribed. He denies any palpitations, or vomitting but does endorse some nausea. He denies any PND or orthopnea (but notes a 2 year h/o using 2 pillows for GERD sxs). Notes that he was previously recommended to use CPAP at night, but has not been compliant with this.  He denies any changes to his vision or weakness/numbness in his extremeties. He does endorse a recent prescription of gabapentin for LE neuropathy, and notes that he only takes this BID (Rx for TID) due to increased sleepiness.    HOSPITAL COURSE: 1. Acute Hypoxic Respiratory Failure secondary to Pulmonary Edema Devin Hanna was found unresponsive at his home with and O2 saturation of <50%.  He was resucitated by EMS and recoved fully with BiPap. He was quickly transitioned to [place] oxygen and maintained his saturations for the remainder of his hospital stay. Upon arrival at the ED, troponins were initially negative, a small leak of 0.34 was noted ~24 hours after his first reported symptoms and continued to trend downward over the next 24 hours. EKG showed some minor ST depression in the lateral leads which quickly resolved. Serial EKG were unremarkable. He was also noted to have mild lactic acidosis which resolved by the time of discharge. Cardiology was consulted and determined that he was low risk for ischemic causes, recommending that a w/u for ACS be deferred at this time. He received a 2D echocardiogram which demonstrated improvement of his LVEF to 50-55% (previously 45-50%) and unchanged grade 1 diastolic dysfunction. He was treated with IV Lasix diuresis for 3 doses, transitioned to P.O. Lasix and titrated down to 59m P.O. Lasix daily. It was determined that he had achieved his dry weight after having lost ~2.3 kg. He was placed on a 2g sodium restricted diet and he will follow-up with cardiology to for further management of his fluid status and diuresis.  2. Acute Upper GI Bleed On HD #3, he experienced a small amount of coffee-ground emesis in the morning and reported a dark melanotic stool later that day. He was noted to have low BPs that afternoon with MAPs in the 50s. He remained asymptomatic except for a mild decrease in his appetite and  some nasea. His Hgb on admission was 14 which trended down to 11.5 at the time of the GI bleed. Hgb was stabilized at ~11.5 at the time of discharge. GI was consulted and elected to preform an EGD with revealed 3 benign appearing ulcers, consistent with aspirin-induced gastropathy, that are the likely source of bleeding. It was determined that he was stable for discharge given the lack of bleeding for 24h. He was given pantoprazole 45m twice daily for  4 weeks, which can then be reduced to 451monce daily. He will follow up with Gastroenterology in ~107m71monthor further management of his GI bleeding and possible repeat endoscopy. GI recommends that his ASA therapy be held for 2-4 weeks in order to promote ulcer healing. Given his concomitant anti-platelet therapy with Plavix it was decided with the input of Cardiology to hold his ASA for 4 weeks. After this interval, cardiology will recommend further anti-platelet therapy. If ASA therapy is restarted, GI recommends lifelong PPI prophylaxis.  (Of note, he had previously taken omeprazole at home PRN GERD symptoms and was on Plavix for anti-platelet therapy in the setting of his stents. Given the potential for reduced efficacy of Plavix with omeprazole, he was transitioned to pantoprazole for PPI coverage.)  3.  Hypertension Mr. LitRelphood pressure was low - normotensive during admission.  This was likely due to acute blood loss and aggressive diuresis with IV Lasix.  Lasix was eventually reduced to po 65m67mily which he will continue at discharge (he was on 65mg59mly prn prior to admission).  Quinapril was reduced from 40mg 81m0mg. 44meg was held once he was noted to have GI bleed with SBP in the 70s and 80s.  Coreg can be resumed at discharge but dose was decreased from 12.5mg BID74m 6.25mg BID8moses can be titrated back up at follow-up.  DISCHARGE DATA: Vital Signs: BP 129/65 mmHg  Pulse 72  Temp(Src) 98.2 F (36.8 C) (Oral)  Resp 15  Ht 5' 7"  (1.702 m)  Wt 234 lb 12.8 oz (106.505 kg)  BMI 36.77 kg/m2  SpO2 98%  Labs: Results for orders placed or performed during the hospital encounter of 09/08/14 (from the past 24 hour(s))  Glucose, capillary     Status: Abnormal   Collection Time: 09/11/14  9:49 PM  Result Value Ref Range   Glucose-Capillary 169 (H) 65 - 99 mg/dL  CBC Once     Status: Abnormal   Collection Time: 09/12/14  3:47 AM  Result Value Ref Range   WBC 10.4 4.0 - 10.5  K/uL   RBC 3.37 (L) 4.22 - 5.81 MIL/uL   Hemoglobin 10.0 (L) 13.0 - 17.0 g/dL   HCT 30.3 (L) 39.0 - 52.0 %   MCV 89.9 78.0 - 100.0 fL   MCH 29.7 26.0 - 34.0 pg   MCHC 33.0 30.0 - 36.0 g/dL   RDW 16.3 (H) 11.5 - 15.5 %   Platelets 204 150 - 400 K/uL  Basic metabolic panel     Status: Abnormal   Collection Time: 09/12/14  3:47 AM  Result Value Ref Range   Sodium 140 135 - 145 mmol/L   Potassium 3.7 3.5 - 5.1 mmol/L   Chloride 104 101 - 111 mmol/L   CO2 28 22 - 32 mmol/L   Glucose, Bld 127 (H) 65 - 99 mg/dL   BUN 38 (H) 6 - 20 mg/dL   Creatinine, Ser 1.24 0.61 - 1.24 mg/dL   Calcium 8.3 (L) 8.9 -  10.3 mg/dL   GFR calc non Af Amer 59 (L) >60 mL/min   GFR calc Af Amer >60 >60 mL/min   Anion gap 8 5 - 15  Glucose, capillary     Status: Abnormal   Collection Time: 09/12/14  7:39 AM  Result Value Ref Range   Glucose-Capillary 127 (H) 65 - 99 mg/dL  Glucose, capillary     Status: Abnormal   Collection Time: 09/12/14 11:28 AM  Result Value Ref Range   Glucose-Capillary 161 (H) 65 - 99 mg/dL     Services Ordered on Discharge: Y = Yes; Blank = No PT:   OT:   RN:   Equipment:   Other:      Time Spent on Discharge: 35 min   Signed: Duwaine Maxin DO PGY 3, Internal Medicine Resident 09/12/2014, 5:21 PM

## 2014-09-12 NOTE — Op Note (Signed)
[facility] [place], [address]   ENDOSCOPY PROCEDURE REPORT  PATIENT: Leanord, Thibeau  MR#: [id number] BIRTHDATE: 11-20-1950 , 70  yrs. old GENDER: male ENDOSCOPIST:Tanyia Grabbe [place], MD REFERRED BY: Dr. Laurian Brim, [place]:  09/12/2014 PROCEDURE:   upper endoscopy ASA CLASS:    per anesthesia INDICATIONS: coffee-ground emesis and 3 g drop in hemoglobin in a patient on aspirin and Plavix MEDICATION: propofol, per anesthesia TOPICAL ANESTHETIC:   none  DESCRIPTION OF PROCEDURE:   After the risks and benefits of the procedure were explained, informed consent was obtained. The patient was brought from his hospital room to the [facility] cone endoscopy unit.  The Pentax adult video  endoscope was introduced through the mouth  and advanced to the second portion of the duodenum, passed under direct vision . The vocal cords looked normal and the esophagus was entered without difficulty.  The instrument was slowly withdrawn as the mucosa was fully examined. Estimated blood loss is zero unless otherwise noted in this procedure report.    The esophagus was normal.       No varices, Mallory-Weiss tear, reflux esophagitis, Barrett's esophagus, infection, or neoplasia were seen. No ring, stricture, or hiatal hernia were seen.  The stomach was entered and noted to have a small bilious residual, without any blood or coffee-ground material. In the antrum, the patient had 3 benign appearing ulcers, consistent with aspirin-induced gastropathy. One of these ulcers had a small red dot, presumably a stigma of recent hemorrhage. Otherwise, the ulcers were all clean-based. The remainder of the stomach was normal, without gastritis, polyps, masses, or vascular ectasia. Retroflexion showed a normal cardia.  The pylorus, duodenal bulb, and second duodenum looked normal.  No biopsies were obtained, and no interventions were  performed. The scope was then withdrawn from the patient and the procedure completed.  COMPLICATIONS: There were no immediate complications.  ENDOSCOPIC IMPRESSION: 1. No active bleeding or blood in the stomach at the time of this exam. 2. Antral ulcers, one of them having a stigmata of recent hemorrhage, presumably accounting for the patient's recent bleeding, and most likely related to his aspirin therapy.   RECOMMENDATIONS: 1. Okay to start diet 2. Okay for discharge at any time, since the patient appears to be at low risk for rebleeding, based on the endoscopic appearance of his ulcers, and the fact that he has not shown evidence of bleeding over the past 24 hours. 3. I would recommend twice daily PPI therapy for the next 2-4 weeks; there are theoretical concerns about interactions in some patients between Plavix and omeprazole, so pantoprazole would probably be a better choice. Thereafter, the patient should have an additional 2 weeks of once daily PPI therapy to complete ulcer healing. 4. Ideally, this patient would be kept off aspirin for at least 2 weeks, to allow initial ulcer healing. If he could stay off aspirin for 4 weeks, that would be even better. However, if aspirin therapy were felt to be critical to his health, he could probably be started back on it in 2-3 days, with relatively low risk of inciting a recurrent hemorrhage. 5. If this patient is going to be maintained on aspirin going forward, I would favor lifelong PPI prophylaxis, to help prevent recurrent ulceration and bleeding. 6. My office will contact the patient to make arrangements for a repeat endoscopy about 2 months from now, to confirm ulcer healing, if the patient is agreeable to that course of  action.   _______________________________ eSignedRonald Lobo, MD 10/05/2014 8:47 AM     cc:  CPT CODES: ICD CODES:  The ICD and CPT codes recommended by this software are interpretations  from the data that the clinical staff has captured with the software.  The verification of the translation of this report to the ICD and CPT codes and modifiers is the sole responsibility of the health care institution and practicing physician where this report was generated.  [employer] will not be held responsible for the validity of the ICD and CPT codes included on this report.  AMA assumes no liability for data contained or not contained herein. CPT is a Designer, television/film set of the [employer].  PATIENT NAME:  Imre, Vecchione MR#: [id number]

## 2014-09-12 NOTE — Progress Notes (Signed)
Subjective: Devin Hanna was seen and examined this AM.  Tolerated EGD w/o problem and feels ready to go home.    Objective: Vital signs in last 24 hours: Filed Vitals:   09/11/14 1612 09/11/14 1930 09/11/14 2046 09/12/14 0500  BP: 110/57 92/46 103/47 117/61  Pulse: 70 74  74  Temp: 98.1 F (36.7 C) 98.7 F (37.1 C)  98.4 F (36.9 C)  TempSrc: Oral Oral  Oral  Resp: 16 19    Height:      Weight:    234 lb 12.8 oz (106.505 kg)  SpO2: 96% 100%  98%   Weight change: -3.4 oz (-0.096 kg)  Intake/Output Summary (Last 24 hours) at 09/12/14 0752 Last data filed at 09/11/14 2000  Gross per 24 hour  Intake    240 ml  Output    600 ml  Net   -360 ml   General: sitting up in bed, NAD HEENT: [place]/AT Cardiac: RRR, no rubs, murmurs or gallops Pulm:  CTA, moving normal volumes of air Abd: soft, nontender, nondistended, BS present Ext: warm and well perfused, 1+ B/L pedal edema Neuro: alert and oriented X3, responding appropriately  Lab Results: Basic Metabolic Panel:  Recent Labs Lab 09/09/14 0853  09/11/14 0459 09/12/14 0347  NA 137  < > 137 140  K 4.4  < > 4.2 3.7  CL 100*  < > 101 104  CO2 23  < > 25 28  GLUCOSE 175*  < > 129* 127*  BUN 20  < > 71* 38*  CREATININE 1.01  < > 1.28* 1.24  CALCIUM 8.7*  < > 8.7* 8.3*  MG 2.0  --   --   --   PHOS 3.1  --   --   --   < > = values in this interval not displayed.  CBC:  Recent Labs Lab 09/08/14 0847  09/10/14 0307  09/11/14 0459 09/12/14 0347  WBC 8.8  < > 11.5*  < > 15.9* 10.4  NEUTROABS 6.0  --  8.2*  --   --   --   HGB 14.9  < > 12.7*  < > 11.6* 10.0*  HCT 44.9  < > 38.4*  < > 35.1* 30.3*  MCV 91.4  < > 92.3  < > 90.0 89.9  PLT 225  < > 218  < > 223 204  < > = values in this interval not displayed.  CBG:  Recent Labs Lab 09/10/14 2112 09/11/14 0711 09/11/14 1131 09/11/14 1609 09/11/14 2149 09/12/14 0739  GLUCAP 118* 147* 206* 139* 169* 127*    Medications: I have reviewed the patient's current  medications. Scheduled Meds: . aspirin EC  81 mg Oral Daily  . atorvastatin  40 mg Oral Daily  . clopidogrel  75 mg Oral Q breakfast  . enoxaparin (LOVENOX) injection  40 mg Subcutaneous Q24H  . furosemide  20 mg Oral Daily  . gabapentin  100 mg Oral TID  . glipiZIDE  10 mg Oral Q breakfast  . insulin aspart  0-9 Units Subcutaneous TID WC  . lisinopril  20 mg Oral Daily  . pantoprazole  40 mg Oral BID WC   Continuous Infusions: none PRN Meds:.calcium carbonate, nitroGLYCERIN   Assessment/Plan: PUD:  Ulcers seen on EGD this AM.  No active bleeding.  Appreciated GI intervention and recommendations.  - d/c patient on protonix 42m BID x 4 weeks and daily thereafter for at least 2 weeks but indefinitely if patient is going to  need to resume daily ASA  - hold ASA for next 4 weeks and then re-evaluate need for continued ASA with cardiologist; continue PPI lifelong if ASA needs to be continued - patient was advised to STOP omeprazole due to potential drug interaction with Plavix; use Protonix instead  Acute respiratory failure with hypoxia:  Resolved.  This may have been due to HF exacerbation but EF is actually improved this admission to 50-55%. - continue Lasix 56m daily at d/c (this was prn prior to admission) - recommend outpatient PFTs  - follow-up with cardiology after d/c  CAD:  stable - continue Plavix, BB, ACEI, statin; decreasing Coreg from 12.5 BID to 6.25 BID and quinapril from 469mto 2010maily due to low-normal BP during admission and addition of daily Lasix. - holding ASA as above for ulcer healing  Lactic acidosis:  No signs of sepsis.  This may have been due to aggressive diuresis and Lasix was reduced.  DM type 2 (diabetes mellitus, type 2):  CBG 127 this AM.  Continue glipizide and resume metformin at d/c (he did not receive contrast this admission).  HTN (hypertension):  stable - decreasing Coreg and quinapril as above  Dispo: Respiratory status improved.  No  active GI bleeding.  He is stable for d/c home with close follow-up with PCP, cardiology.  He will have repeat scope by GI in 2 months to assess for healing.  The patient does have a current PCP (ChMarny LowensteiniMacario CarlsMD) and [facility].  The patient does not know have transportation limitations that hinder transportation to clinic appointments.  .Services Needed at time of discharge: Y = Yes, Blank = No PT:   OT:   RN:   Equipment:   Other:     LOS: 4 days   AleFrancesca OmanO 09/12/2014, 7:52 AM

## 2014-09-12 NOTE — Progress Notes (Signed)
Internal Medicine Attending  Date: 09/12/2014  Patient name: Devin Hanna Medical record number: [id number] Date of birth: October 21, 1950 Age: 64 y.o. Gender: male  I saw and evaluated the patient. I reviewed the resident's note by Dr. Redmond Pulling and I agree with the resident's findings and plans as documented in her progress note.  EGD demonstrated 3 small gastric ulcers consistent with aspirin induction. One of them also had stigmata of recent bleeding, but there was no active bleeding. It was felt his risk for rebleeding was low at this point. He will therefore be continued on his Plavix and his ulcers will be treated with twice-daily PPI therapy for one month. The aspirin will be held during this time as well. In one month his PPI therapy can be transitioned to daily and he can be restarted on low-dose aspirin should cardiology feel this is necessary. He has had no other evidence of myocardial ischemia or heart failure and is ready for discharge home today.

## 2014-09-12 NOTE — Progress Notes (Signed)
The patient tolerated his endoscopy very well. It did show several antral ulcers.  My dictated endoscopy note outlines all my recommendations for his management. Please review it, and call me if you have any questions.  I will sign off at this time.  [name], M.D. Pager [phone] If no answer or after 5 PM call [phone]

## 2014-09-12 NOTE — Transfer of Care (Signed)
Immediate Anesthesia Transfer of Care Note  Patient: Devin Hanna  Procedure(s) Performed: Procedure(s): ESOPHAGOGASTRODUODENOSCOPY (EGD) WITH PROPOFOL (N/A)  Patient Location: Endoscopy Unit  Anesthesia Type:MAC  Level of Consciousness: awake, alert , oriented and sedated  Airway & Oxygen Therapy: Patient Spontanous Breathing and Patient connected to nasal cannula oxygen  Post-op Assessment: Report given to RN and Post -op Vital signs reviewed and stable  Post vital signs: Reviewed and stable  Last Vitals:  Filed Vitals:   09/12/14 0843  BP: 122/66  Pulse: 69  Temp: 36.8 C  Resp: 24    Complications: No apparent anesthesia complications

## 2014-09-13 ENCOUNTER — Encounter (HOSPITAL_COMMUNITY): Payer: Self-pay | Admitting: Gastroenterology

## 2014-09-13 ENCOUNTER — Telehealth: Payer: Self-pay | Admitting: *Deleted

## 2014-09-13 NOTE — Telephone Encounter (Signed)
Pt needs to know when he can go back to work, was seen in [place] him a hos[facility] apt on sept 16 and I have him on the waiting list. Please advise.

## 2014-09-13 NOTE — Telephone Encounter (Signed)
S/w pt who was discharged from [facility] on 8/3 Pt asking when he can return to work. Per discharge paperwork, he is to make f/u appt with Dr. Hardin Negus, his PCP, within the week. Pt states he has not yet called him for appt. He has appt with Dr. Fletcher Anon. 9/16 at 10:45am Suggested to pt he call Dr. Hardin Negus office to be seen for clearance to return to work. Pt verbalized understanding and confirmed Sept appt with Arida.

## 2014-09-25 ENCOUNTER — Encounter: Payer: Self-pay | Admitting: Cardiovascular Disease

## 2014-09-25 ENCOUNTER — Ambulatory Visit (INDEPENDENT_AMBULATORY_CARE_PROVIDER_SITE_OTHER): Payer: BLUE CROSS/BLUE SHIELD | Admitting: Cardiovascular Disease

## 2014-09-25 VITALS — BP 142/76 | HR 73 | Ht 67.0 in | Wt 243.5 lb

## 2014-09-25 DIAGNOSIS — I2510 Atherosclerotic heart disease of native coronary artery without angina pectoris: Secondary | ICD-10-CM | POA: Diagnosis not present

## 2014-09-25 DIAGNOSIS — R0602 Shortness of breath: Secondary | ICD-10-CM

## 2014-09-25 DIAGNOSIS — I5022 Chronic systolic (congestive) heart failure: Secondary | ICD-10-CM | POA: Diagnosis not present

## 2014-09-25 DIAGNOSIS — I10 Essential (primary) hypertension: Secondary | ICD-10-CM

## 2014-09-25 NOTE — Assessment & Plan Note (Signed)
Blood pressure is reasonably controlled.

## 2014-09-25 NOTE — Progress Notes (Signed)
Primary care physician: Dr. Laurian Brim  HPI  [name] is a 64 year old man who is here for a followup visit regarding coronary artery disease and cardiomyopathy.  He has known CAD with past RCA stents and prior MI, GERD, DM, HTN and HLD.  He presented in February of 2015 with sudden onset of SOB - required Lasix and Bipap - ruled in for MI -cardiac catheterization showed 99% in-stent restenosis in the right coronary artery which was heavily calcified. There was also 60-70% distal LAD stenosis. EF was 30-35% by echo. He did have left bundle branch block. RCA PCI was attempted and was initially unsuccessful due to inability to dilate the calcified lesion. I performed successful  rotational atherectomy, cutting balloon angioplasty and DES to the proximal RCA .  Echocardiogram in 09/2013 showed EF of 45-50% with mild MR.  He was hospitalized recently at [facility] with shortness of breath and severe hypoxia. He was unresponsive by EMS and responded to BiPAP. His labs showed borderline elevated troponin, borderline BNP and mild lactic acidosis. He had a repeat echocardiogram which showed improvement in LV systolic function with an ejection fraction of 50-55%. He improved with daily with furosemide. During his hospitalization, he had small amount of coffee ground emesis and dark melanotic stool. He did not require transfusion. 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 The patient has been doing well since hospital discharge with no recurrent symptoms. He is now taking Lasix 20 mg once daily instead of as needed.   No Known Allergies   Current Outpatient Prescriptions on File Prior to Visit  Medication Sig Dispense Refill  . atorvastatin (LIPITOR) 40 MG tablet Take 1 tablet (40 mg total) by mouth daily. 90 tablet 3  . calcium carbonate (TUMS - DOSED IN MG ELEMENTAL CALCIUM) 500 MG chewable tablet Chew 1 tablet by mouth as needed for indigestion or heartburn.    .  carvedilol (COREG) 6.25 MG tablet Take 1 tablet (6.25 mg total) by mouth 2 (two) times daily. 60 tablet 0  . Cinnamon 500 MG capsule Take 1,000 mg by mouth 2 (two) times daily with a meal.    . clopidogrel (PLAVIX) 75 MG tablet Take 1 tablet (75 mg total) by mouth daily with breakfast. 30 tablet 3  . furosemide (LASIX) 20 MG tablet Take 1 tablet (20 mg total) by mouth daily. 30 tablet 0  . gabapentin (NEURONTIN) 100 MG capsule Take 100 mg by mouth 3 (three) times daily.    Marland Kitchen glipiZIDE (GLUCOTROL XL) 10 MG 24 hr tablet Take 10 mg by mouth daily with breakfast.     . metFORMIN (GLUCOPHAGE) 1000 MG tablet Take 1,000 mg by mouth 2 (two) times daily with a meal.    . Multiple Vitamin (MULTIVITAMIN WITH MINERALS) TABS Take 1 tablet by mouth daily.    . nitroGLYCERIN (NITROSTAT) 0.4 MG SL tablet Place 0.4 mg under the tongue every 5 (five) minutes as needed for chest pain.    . pantoprazole (PROTONIX) 40 MG tablet Take 1 tablet (40 mg total) by mouth 2 (two) times daily. 60 tablet 0  . quinapril (ACCUPRIL) 20 MG tablet Take 1 tablet (20 mg total) by mouth daily. 30 tablet 0   No current facility-administered medications on file prior to visit.     Past Medical History  Diagnosis Date  . MI (myocardial infarction)     "has not seen cardiologist in several years"  . Diabetes mellitus     sees Dr. Hardin Negus  in [place]  . Hyperlipidemia   . Vertigo      Past Surgical History  Procedure Laterality Date  . Coronary stent placement    . External fixation leg Left 04/03/2012    Procedure: EXTERNAL FIXATION LEG;  Surgeon: Wylene Simmer, MD;  Location: [place];  Service: Orthopedics;  Laterality: Left;  . Irrigation and debridement knee Left 04/03/2012    Procedure: IRRIGATION AND DEBRIDEMENT KNEE;  Surgeon: Wylene Simmer, MD;  Location: [place];  Service: Orthopedics;  Laterality: Left;  . Orif tibia plateau Left 04/14/2012    Procedure: OPEN REDUCTION INTERNAL FIXATION (ORIF) TIBIAL PLATEAU;  Surgeon:  Wylene Simmer, MD;  Location: [place];  Service: Orthopedics;  Laterality: Left;  . External fixation removal Left 04/14/2012    Procedure: REMOVAL EXTERNAL FIXATION LEG;  Surgeon: Wylene Simmer, MD;  Location: [place];  Service: Orthopedics;  Laterality: Left;  . Left heart catheterization with coronary angiogram N/A 04/05/2013    Procedure: LEFT HEART CATHETERIZATION WITH CORONARY ANGIOGRAM;  Surgeon: [name], MD;  Location: [place] CATH LAB;  Service: Cardiovascular;  Laterality: N/A;  . Percutaneous coronary rotoblator intervention (pci-r) N/A 04/06/2013    Procedure: PERCUTANEOUS CORONARY ROTOBLATOR INTERVENTION (PCI-R);  Surgeon: [name], MD;  Location: [facility] CATH LAB;  Service: Cardiovascular;  Laterality: N/A;  . Esophagogastroduodenoscopy (egd) with propofol N/A 09/12/2014    Procedure: ESOPHAGOGASTRODUODENOSCOPY (EGD) WITH PROPOFOL;  Surgeon: Ronald Lobo, MD;  Location: [place];  Service: Endoscopy;  Laterality: N/A;     Family History  Problem Relation Age of Onset  . Coronary artery disease Brother   . Cancer Brother   . Coronary artery disease Father      Social History   Social History  . Marital Status: Single    Spouse Name: N/A  . Number of Children: N/A  . Years of Education: N/A   Occupational History  . heavy Company secretary    Social History Main Topics  . Smoking status: Former Smoker    Quit date: 04/05/1995  . Smokeless tobacco: Not on file  . Alcohol Use: Yes     Comment: Occassional Use  . Drug Use: No  . Sexual Activity: Not on file   Other Topics Concern  . Not on file   Social History Narrative   Heavy Company secretary.       Divorced      PHYSICAL EXAM   BP 142/76 mmHg  Pulse 73  Ht 5' 7"  (1.702 m)  Wt 243 lb 8 oz (110.451 kg)  BMI 38.13 kg/m2 Constitutional: He is oriented to person, place, and time. He appears well-developed and well-nourished. No distress.  HENT: No nasal discharge.  Head: Normocephalic and  atraumatic.  Eyes: Pupils are equal and round.  No discharge. Neck: Normal range of motion. Neck supple. No JVD present. No thyromegaly present.  Cardiovascular: Normal rate, regular rhythm, normal heart sounds. Exam reveals no gallop and no friction rub. There is one out of 6 systolic ejection murmur in the aortic area.  Pulmonary/Chest: Effort normal and breath sounds normal. No stridor. No respiratory distress. He has no wheezes. He has no rales. He exhibits no tenderness.  Abdominal: Soft. Bowel sounds are normal. He exhibits no distension. There is no tenderness. There is no rebound and no guarding.  Musculoskeletal: Normal range of motion. He exhibits chronic left leg edema and no tenderness.  Neurological: He is alert and oriented to person, place, and time. Coordination normal.  Skin: Skin is warm and  dry. No rash noted. He is not diaphoretic. No erythema. No pallor.  Psychiatric: He has a normal mood and affect. His behavior is normal. Judgment and thought content normal.     ACA:CLJQW  Rhythm  -Nonspecific QRS widening.   -Inferior infarct -age undetermined  -Poor R-wave progression -nonspecific -consider old anterior infarct.   ABNORMAL    ASSESSMENT AND PLAN

## 2014-09-25 NOTE — Patient Instructions (Addendum)
Medication Instructions: - no changes  Labwork: - none  Procedures/Testing: - Your physician has requested that you have a lexiscan myoview. For further information please visit [email or web address]. Please follow instruction sheet, as given.  Follow-Up: - Your physician recommends that you schedule a follow-up appointment in: 3 months with Dr. Fletcher Anon.  Any Additional Special Instructions Will Be Listed Below (If Applicable).    [place]  Your caregiver has ordered a Stress Test with nuclear imaging. The purpose of this test is to evaluate the blood supply to your heart muscle. This procedure is referred to as a "Non-Invasive Stress Test." This is because other than having an IV started in your vein, nothing is inserted or "invades" your body. Cardiac stress tests are done to find areas of poor blood flow to the heart by determining the extent of coronary artery disease (CAD). Some patients exercise on a treadmill, which naturally increases the blood flow to your heart, while others who are  unable to walk on a treadmill due to physical limitations have a pharmacologic/chemical stress agent called Lexiscan . This medicine will mimic walking on a treadmill by temporarily increasing your coronary blood flow.   Please note: these test may take anywhere between 2-4 hours to complete  PLEASE REPORT TO [place] AT THE FIRST DESK WILL DIRECT YOU WHERE TO GO  Date of Procedure:_________Thursday 09/27/14 at 8:00 am____________________________  Arrival Time for Procedure:________7:30 am______________________  Instructions regarding medication:   __x__ : Hold diabetes medication morning of procedure (GLIPIZIDE/ METFORMIN)  __X__:  Hold betablocker(s) night before procedure and morning of procedure (COREG)  _X___:  Hold other medications as follows:  LASIX THE MORNING OF THE PROCEDURE   PLEASE NOTIFY THE OFFICE AT LEAST 24 HOURS IN ADVANCE IF YOU ARE  UNABLE TO KEEP YOUR APPOINTMENT.  [phone] AND  PLEASE NOTIFY NUCLEAR MEDICINE AT [facility] AT LEAST 24 HOURS IN ADVANCE IF YOU ARE UNABLE TO KEEP YOUR APPOINTMENT. [phone]  How to prepare for your Myoview test:  1. Do not eat or drink after midnight 2. No caffeine for 24 hours prior to test 3. No smoking 24 hours prior to test. 4. Your medication may be taken with water.  If your doctor stopped a medication because of this test, do not take that medication. 5. Ladies, please do not wear dresses.  Skirts or pants are appropriate. Please wear a short sleeve shirt. 6. No perfume, cologne or lotion. 7. Wear comfortable walking shoes. No heels!

## 2014-09-25 NOTE — Assessment & Plan Note (Signed)
Most recent ejection fraction was 50-55%. He is now taking Lasix 20 mg once daily. It is possible that the decompensation was caused by sleep apnea which is currently not being treated. The patient reports inability to tolerate CPAP. We might have to consider referral to a sleep medicine specialist.

## 2014-09-25 NOTE — Assessment & Plan Note (Signed)
The patient had recent sudden episode of acute diastolic heart failure. He reports that symptoms were very similar to his most recent ischemic cardiac events. Thus, I think it's important to exclude ischemia as a cause for his recent presentation. Thus, I requested a pharmacologic nuclear stress test. His EKG showed IVCD and thus I do not recommend treadmill testing. He wants to go back to work. He operates heavy equipment and I think we have to insure no high-risk ischemia on his stress test before he goes back to work. His most recent PCI was more than one year ago and thus it's reasonable to leave him on Plavix monotherapy without aspirin especially with his recent upper GI bleed.

## 2014-09-26 ENCOUNTER — Other Ambulatory Visit: Payer: Self-pay

## 2014-09-26 MED ORDER — CARVEDILOL 6.25 MG PO TABS: 6.2500 mg | ORAL_TABLET | Freq: Two times a day (BID) | ORAL | Status: DC

## 2014-09-26 MED ORDER — PANTOPRAZOLE SODIUM 40 MG PO TBEC: 40.0000 mg | DELAYED_RELEASE_TABLET | Freq: Two times a day (BID) | ORAL | Status: DC

## 2014-09-26 MED ORDER — FUROSEMIDE 20 MG PO TABS: 20.0000 mg | ORAL_TABLET | Freq: Every day | ORAL | Status: DC

## 2014-09-26 NOTE — Telephone Encounter (Signed)
Refill sent for pantoprazole.

## 2014-09-26 NOTE — Telephone Encounter (Signed)
Refill sent for Furosemide 20 mg

## 2014-09-26 NOTE — Telephone Encounter (Signed)
Refill sent for carvediolol.

## 2014-09-27 ENCOUNTER — Ambulatory Visit
Admission: RE | Admit: 2014-09-27 | Discharge: 2014-09-27 | Disposition: A | Discharge status: Home / Self Care | Payer: Medicare Other | Source: Ambulatory Visit | Attending: Cardiovascular Disease | Admitting: Cardiovascular Disease

## 2014-09-27 ENCOUNTER — Other Ambulatory Visit: Payer: Self-pay | Admitting: *Deleted

## 2014-09-27 DIAGNOSIS — R0602 Shortness of breath: Secondary | ICD-10-CM | POA: Diagnosis present

## 2014-09-27 DIAGNOSIS — J9600 Acute respiratory failure, unspecified whether with hypoxia or hypercapnia: Secondary | ICD-10-CM | POA: Insufficient documentation

## 2014-09-27 DIAGNOSIS — I2510 Atherosclerotic heart disease of native coronary artery without angina pectoris: Secondary | ICD-10-CM | POA: Diagnosis present

## 2014-09-27 DIAGNOSIS — K279 Peptic ulcer, site unspecified, unspecified as acute or chronic, without hemorrhage or perforation: Secondary | ICD-10-CM | POA: Insufficient documentation

## 2014-09-27 DIAGNOSIS — I252 Old myocardial infarction: Secondary | ICD-10-CM | POA: Diagnosis not present

## 2014-09-27 DIAGNOSIS — I10 Essential (primary) hypertension: Secondary | ICD-10-CM | POA: Insufficient documentation

## 2014-09-27 DIAGNOSIS — Q218 Other congenital malformations of cardiac septa: Secondary | ICD-10-CM | POA: Insufficient documentation

## 2014-09-27 DIAGNOSIS — E119 Type 2 diabetes mellitus without complications: Secondary | ICD-10-CM | POA: Diagnosis not present

## 2014-09-27 DIAGNOSIS — E785 Hyperlipidemia, unspecified: Secondary | ICD-10-CM | POA: Diagnosis not present

## 2014-09-27 DIAGNOSIS — I5022 Chronic systolic (congestive) heart failure: Secondary | ICD-10-CM | POA: Insufficient documentation

## 2014-09-27 MED ORDER — QUINAPRIL HCL 20 MG PO TABS: 20.0000 mg | ORAL_TABLET | Freq: Every day | ORAL | Status: DC

## 2014-09-27 MED ORDER — TECHNETIUM TC 99M SESTAMIBI - CARDIOLITE
10.0000 | Freq: Once | INTRAVENOUS | Status: AC | PRN
  Administered 2014-09-27: 08:00:00 12.63 via INTRAVENOUS

## 2014-09-27 MED ORDER — TECHNETIUM TC 99M SESTAMIBI - CARDIOLITE
30.0000 | Freq: Once | INTRAVENOUS | Status: AC | PRN
  Administered 2014-09-27: 32.42 via INTRAVENOUS

## 2014-09-27 MED ORDER — REGADENOSON 0.4 MG/5ML IV SOLN
0.4000 mg | Freq: Once | INTRAVENOUS | Status: AC
  Administered 2014-09-27: 0.4 mg via INTRAVENOUS

## 2014-09-28 ENCOUNTER — Telehealth: Payer: Self-pay | Admitting: *Deleted

## 2014-09-28 LAB — NM MYOCAR MULTI W/SPECT W/WALL MOTION / EF
CHL CUP NUCLEAR SDS: 4
CHL CUP NUCLEAR SRS: 15
CHL CUP RESTING HR STRESS: 66 {beats}/min
LV sys vol: 66 mL
LVDIAVOL: 178 mL
Peak HR: 82 {beats}/min
Percent HR: 53 %
SSS: 17

## 2014-09-28 NOTE — Telephone Encounter (Signed)
Please call patient with stress test results and the ok to go back to work. Thanks

## 2014-10-01 ENCOUNTER — Other Ambulatory Visit (INDEPENDENT_AMBULATORY_CARE_PROVIDER_SITE_OTHER): Payer: BLUE CROSS/BLUE SHIELD | Admitting: *Deleted

## 2014-10-01 ENCOUNTER — Other Ambulatory Visit: Payer: Self-pay

## 2014-10-01 DIAGNOSIS — Z01812 Encounter for preprocedural laboratory examination: Secondary | ICD-10-CM

## 2014-10-01 NOTE — Telephone Encounter (Signed)
S/w pt 8/22. Cath scheduled for 8/24 See result note.

## 2014-10-01 NOTE — Patient Instructions (Addendum)
Your physician has requested that you have a cardiac catheterization. Cardiac catheterization is used to diagnose and/or treat various heart conditions. Doctors may recommend this procedure for a number of different reasons. The most common reason is to evaluate chest pain. Chest pain can be a symptom of coronary artery disease (CAD), and cardiac catheterization can show whether plaque is narrowing or blocking your heart's arteries. This procedure is also used to evaluate the valves, as well as measure the blood flow and oxygen levels in different parts of your heart. Please follow instruction sheet, as given.  [facility] Cardiac Cath Instructions   You are scheduled for a Cardiac Cath on: Wednesday, August 24, 10:30am at [facility]  Please arrive at  8:30am on the day of your procedure  Do not eat/drink anything after midnight  Someone will need to drive you home  It is recommended someone be with you for the first 24 hours after your procedure  Wear clothes that are easy to get on/off and wear slip on shoes if possible   Medications bring a current list of all medications with you   _xx__ Do not take these medications before your procedure: DO NOT TAKE METFORMIN 24 HOURS BEFORE AND FOR 48 HOURS AFTER YOUR PROCEDURE. DO NOT TAKE GLIPIZIDE 24 HOURS BEFORE AND FOR 24 HOURS AFTER YOUR PROCEDURE DO NOT TAKE LASIX THE MORNING OF YOUR PROCEDURE.  Day of your procedure: Arrive at 8:30am  [facility] Short Stay [address]  [phone]   The usual length of stay after your procedure is about 2 to 3 hours.  This can vary.  If you have any questions, please call our office at [phone] or [facility] at [phone] An angiogram, also called angiography, is a procedure used to look at the blood vessels that carry blood to different parts of your body (arteries). In this procedure, dye is injected through a long, thin tube (catheter) into an artery.  X-rays are then taken. The X-rays will show if there is a blockage or problem in a blood vessel.  LET [facility] CARE PROVIDER KNOW ABOUT:  Any allergies you have, including allergies to shellfish or contrast dye.   All medicines you are taking, including vitamins, herbs, eye drops, creams, and over-the-counter medicines.   Previous problems you or members of your family have had with the use of anesthetics.   Any blood disorders you have.   Previous surgeries you have had.  Any previous kidney problems or failure you have had.  Medical conditions you have.   Possibility of pregnancy, if this applies. RISKS AND COMPLICATIONS Generally, an angiogram is a safe procedure. However, as with any procedure, problems can occur. Possible problems include:  Injury to the blood vessels, including rupture or bleeding.  Infection or bruising at the catheter site.  Allergic reaction to the dye or contrast used.  Kidney damage from the dye or contrast used.  Blood clots that can lead to a stroke or heart attack. BEFORE THE PROCEDURE  Do not eat or drink after midnight on the night before the procedure, or as directed by your health care provider.   Ask your health care provider if you may drink enough water to take any needed medicines the morning of the procedure.  PROCEDURE  You may be given a medicine to help you relax (sedative) before and during the procedure. This medicine is given through an IV access tube that is inserted into one of your veins.  The area where the catheter will be inserted will be washed and shaved. This is usually done in the groin but may be done in the fold of your arm (near your elbow) or in the wrist.  A medicine will be given to numb the area where the catheter will be inserted (local anesthetic).  The catheter will be inserted with a guide wire into an artery. The catheter is guided by using a type of X-ray (fluoroscopy) to the blood vessel  being examined.   Dye is then injected into the catheter, and X-rays are taken. The dye helps to show where any narrowing or blockages are located.  AFTER THE PROCEDURE   If the procedure is done through the leg, you will be kept in bed lying flat for several hours. You will be instructed to not bend or cross your legs.  The insertion site will be checked frequently.  The pulse in your feet or wrist will be checked frequently.  Additional blood tests, X-rays, and electrocardiography may be done.   You may need to stay in the hospital overnight for observation.  Document Released: 11/05/2004 Document Revised: 01/31/2013 Document Reviewed: 06/29/2012 [facility] Patient Information 2015 [place], [place]. This information is not intended to replace advice given to you by your health care provider. Make sure you discuss any questions you have with your health care provider. Angiogram, Care After Refer to this sheet in the next few weeks. These instructions provide you with information on caring for yourself after your procedure. Your health care provider may also give you more specific instructions. Your treatment has been planned according to current medical practices, but problems sometimes occur. Call your health care provider if you have any problems or questions after your procedure.  WHAT TO EXPECT AFTER THE PROCEDURE After your procedure, it is typical to have the following sensations:  Minor discomfort or tenderness and a small bump at the catheter insertion site. The bump should usually decrease in size and tenderness within 1 to 2 weeks.  Any bruising will usually fade within 2 to 4 weeks. HOME CARE INSTRUCTIONS   You may need to keep taking blood thinners if they were prescribed for you. Take medicines only as directed by your health care provider.  Do not apply powder or lotion to the site.  Do not take baths, swim, or use a hot tub until your health care provider  approves.  You may shower 24 hours after the procedure. Remove the bandage (dressing) and gently wash the site with plain soap and water. Gently pat the site dry.  Inspect the site at least twice daily.  Limit your activity for the first 48 hours. Do not bend, squat, or lift anything over 20 lb (9 kg) or as directed by your health care provider.  Plan to have someone take you home after the procedure. Follow instructions about when you can drive or return to work. SEEK MEDICAL CARE IF:  You get light-headed when standing up.  You have drainage (other than a small amount of blood on the dressing).  You have chills.  You have a fever.  You have redness, warmth, swelling, or pain at the insertion site. SEEK IMMEDIATE MEDICAL CARE IF:   You develop chest pain or shortness of breath, feel faint, or pass out.  You have bleeding, swelling larger than a walnut, or drainage from the catheter insertion site.  You develop pain, discoloration, coldness, or severe bruising in the leg or arm that held the  catheter.  You develop bleeding from any other place, such as the bowels. You may see bright red blood in your urine or stools, or your stools may appear black and tarry.  You have heavy bleeding from the site. If this happens, hold pressure on the site. MAKE SURE YOU:  Understand these instructions.  Will watch your condition.  Will get help right away if you are not doing well or get worse. Document Released: 08/14/2004 Document Revised: 06/12/2013 Document Reviewed: 06/20/2012 [facility] Patient Information 2015 [place], [place]. This information is not intended to replace advice given to you by your health care provider. Make sure you discuss any questions you have with your health care provider.

## 2014-10-02 ENCOUNTER — Telehealth: Payer: Self-pay

## 2014-10-02 LAB — BASIC METABOLIC PANEL
BUN/Creatinine Ratio: 13 (ref 10–22)
BUN: 11 mg/dL (ref 8–27)
CALCIUM: 9.2 mg/dL (ref 8.6–10.2)
CHLORIDE: 96 mmol/L — AB (ref 97–108)
CO2: 18 mmol/L (ref 18–29)
CREATININE: 0.84 mg/dL (ref 0.76–1.27)
GFR calc Af Amer: 105 mL/min/{1.73_m2} (ref >59)
GFR calc non Af Amer: 91 mL/min/{1.73_m2} (ref >59)
GLUCOSE: 232 mg/dL — AB (ref 65–99)
Potassium: 4.4 mmol/L (ref 3.5–5.2)
Sodium: 136 mmol/L (ref 134–144)

## 2014-10-02 LAB — CBC
HEMOGLOBIN: 9.4 g/dL — AB (ref 12.6–17.7)
Hematocrit: 29.3 % — ABNORMAL LOW (ref 37.5–51.0)
MCH: 27.6 pg (ref 26.6–33.0)
MCHC: 32.1 g/dL (ref 31.5–35.7)
MCV: 86 fL (ref 79–97)
PLATELETS: 260 10*3/uL (ref 150–379)
RBC: 3.41 x10E6/uL — ABNORMAL LOW (ref 4.14–5.80)
RDW: 17.7 % — ABNORMAL HIGH (ref 12.3–15.4)
WBC: 6.9 10*3/uL (ref 3.4–10.8)

## 2014-10-02 LAB — PROTIME-INR
INR: 1.1 (ref 0.8–1.2)
PROTHROMBIN TIME: 11.8 s (ref 9.1–12.0)

## 2014-10-02 NOTE — Telephone Encounter (Signed)
S/w pt regarding cath tomorrow at [facility]. Reviewed time, location, medications to hold. Pt states he has "done everything we told him to on the sheet of paper"  Pt verbalized understanding with no further questions.

## 2014-10-03 ENCOUNTER — Ambulatory Visit (HOSPITAL_COMMUNITY)
Admission: RE | Admit: 2014-10-03 | Discharge: 2014-10-03 | Disposition: A | Discharge status: Home / Self Care | Payer: BLUE CROSS/BLUE SHIELD | Source: Ambulatory Visit | Attending: Cardiovascular Disease | Admitting: Cardiovascular Disease

## 2014-10-03 ENCOUNTER — Encounter (HOSPITAL_COMMUNITY)
Admission: RE | Disposition: A | Discharge status: Home / Self Care | Payer: BLUE CROSS/BLUE SHIELD | Source: Ambulatory Visit | Attending: Cardiovascular Disease

## 2014-10-03 ENCOUNTER — Encounter (HOSPITAL_COMMUNITY): Payer: Self-pay | Admitting: Cardiovascular Disease

## 2014-10-03 DIAGNOSIS — R42 Dizziness and giddiness: Secondary | ICD-10-CM | POA: Diagnosis not present

## 2014-10-03 DIAGNOSIS — Z955 Presence of coronary angioplasty implant and graft: Secondary | ICD-10-CM | POA: Diagnosis not present

## 2014-10-03 DIAGNOSIS — I2510 Atherosclerotic heart disease of native coronary artery without angina pectoris: Secondary | ICD-10-CM | POA: Insufficient documentation

## 2014-10-03 DIAGNOSIS — K319 Disease of stomach and duodenum, unspecified: Secondary | ICD-10-CM | POA: Insufficient documentation

## 2014-10-03 DIAGNOSIS — I252 Old myocardial infarction: Secondary | ICD-10-CM | POA: Insufficient documentation

## 2014-10-03 DIAGNOSIS — E119 Type 2 diabetes mellitus without complications: Secondary | ICD-10-CM | POA: Insufficient documentation

## 2014-10-03 DIAGNOSIS — K219 Gastro-esophageal reflux disease without esophagitis: Secondary | ICD-10-CM | POA: Insufficient documentation

## 2014-10-03 DIAGNOSIS — I429 Cardiomyopathy, unspecified: Secondary | ICD-10-CM | POA: Insufficient documentation

## 2014-10-03 DIAGNOSIS — R9439 Abnormal result of other cardiovascular function study: Secondary | ICD-10-CM | POA: Diagnosis present

## 2014-10-03 DIAGNOSIS — I447 Left bundle-branch block, unspecified: Secondary | ICD-10-CM | POA: Insufficient documentation

## 2014-10-03 DIAGNOSIS — R748 Abnormal levels of other serum enzymes: Secondary | ICD-10-CM | POA: Insufficient documentation

## 2014-10-03 DIAGNOSIS — Z87891 Personal history of nicotine dependence: Secondary | ICD-10-CM | POA: Diagnosis not present

## 2014-10-03 DIAGNOSIS — E785 Hyperlipidemia, unspecified: Secondary | ICD-10-CM | POA: Insufficient documentation

## 2014-10-03 DIAGNOSIS — I10 Essential (primary) hypertension: Secondary | ICD-10-CM | POA: Insufficient documentation

## 2014-10-03 HISTORY — PX: CARDIAC CATHETERIZATION: SHX172

## 2014-10-03 LAB — GLUCOSE, CAPILLARY: GLUCOSE-CAPILLARY: 212 mg/dL — AB (ref 65–99)

## 2014-10-03 SURGERY — LEFT HEART CATH AND CORONARY ANGIOGRAPHY

## 2014-10-03 MED ORDER — SODIUM CHLORIDE 0.9 % IJ SOLN: 3.0000 mL | INTRAMUSCULAR | Status: DC | PRN

## 2014-10-03 MED ORDER — LIDOCAINE HCL (PF) 1 % IJ SOLN
INTRAMUSCULAR | Status: AC
  Filled 2014-10-03: qty 30

## 2014-10-03 MED ORDER — MIDAZOLAM HCL 2 MG/2ML IJ SOLN
INTRAMUSCULAR | Status: AC
  Filled 2014-10-03: qty 4

## 2014-10-03 MED ORDER — VERAPAMIL HCL 2.5 MG/ML IV SOLN
INTRAVENOUS | Status: AC
  Filled 2014-10-03: qty 2

## 2014-10-03 MED ORDER — SODIUM CHLORIDE 0.9 % WEIGHT BASED INFUSION: 1.0000 mL/kg/h | INTRAVENOUS | Status: DC

## 2014-10-03 MED ORDER — SODIUM CHLORIDE 0.9 % IJ SOLN: 3.0000 mL | Freq: Two times a day (BID) | INTRAMUSCULAR | Status: DC

## 2014-10-03 MED ORDER — SODIUM CHLORIDE 0.9 % IV SOLN: 250.0000 mL | INTRAVENOUS | Status: DC | PRN

## 2014-10-03 MED ORDER — IOHEXOL 350 MG/ML SOLN
INTRAVENOUS | Status: DC | PRN
  Administered 2014-10-03: 80 mL via INTRA_ARTERIAL

## 2014-10-03 MED ORDER — MIDAZOLAM HCL 2 MG/2ML IJ SOLN
INTRAMUSCULAR | Status: DC | PRN
  Administered 2014-10-03: 1 mg via INTRAVENOUS

## 2014-10-03 MED ORDER — SODIUM CHLORIDE 0.9 % WEIGHT BASED INFUSION
3.0000 mL/kg/h | INTRAVENOUS | Status: AC
  Administered 2014-10-03: 3 mL/kg/h via INTRAVENOUS

## 2014-10-03 MED ORDER — NITROGLYCERIN 1 MG/10 ML FOR IR/CATH LAB
INTRA_ARTERIAL | Status: AC
  Filled 2014-10-03: qty 10

## 2014-10-03 MED ORDER — HEPARIN (PORCINE) IN NACL 2-0.9 UNIT/ML-% IJ SOLN
INTRAMUSCULAR | Status: AC
  Filled 2014-10-03: qty 1000

## 2014-10-03 MED ORDER — VERAPAMIL HCL 2.5 MG/ML IV SOLN
INTRAVENOUS | Status: DC | PRN
  Administered 2014-10-03: 10:00:00 via INTRA_ARTERIAL

## 2014-10-03 MED ORDER — LIDOCAINE HCL (PF) 1 % IJ SOLN
INTRAMUSCULAR | Status: DC | PRN
  Administered 2014-10-03: 5 mL

## 2014-10-03 MED ORDER — HEPARIN SODIUM (PORCINE) 1000 UNIT/ML IJ SOLN
INTRAMUSCULAR | Status: AC
  Filled 2014-10-03: qty 1

## 2014-10-03 MED ORDER — ASPIRIN 81 MG PO CHEW: 81.0000 mg | CHEWABLE_TABLET | ORAL | Status: DC

## 2014-10-03 MED ORDER — SODIUM CHLORIDE 0.9 % IV SOLN: INTRAVENOUS | Status: DC

## 2014-10-03 MED ORDER — FENTANYL CITRATE (PF) 100 MCG/2ML IJ SOLN
INTRAMUSCULAR | Status: AC
  Filled 2014-10-03: qty 4

## 2014-10-03 MED ORDER — HEPARIN SODIUM (PORCINE) 1000 UNIT/ML IJ SOLN
INTRAMUSCULAR | Status: DC | PRN
  Administered 2014-10-03: 4000 [IU] via INTRAVENOUS

## 2014-10-03 MED ORDER — FENTANYL CITRATE (PF) 100 MCG/2ML IJ SOLN
INTRAMUSCULAR | Status: DC | PRN
Start: 2014-10-03 — End: 2014-10-03
  Administered 2014-10-03: 25 ug via INTRAVENOUS

## 2014-10-03 SURGICAL SUPPLY — 10 items
CATH INFINITI 5FR ANG PIGTAIL (CATHETERS) ×3 IMPLANT
CATH OPTITORQUE JACKY 4.0 5F (CATHETERS) ×3 IMPLANT
DEVICE RAD COMP TR BAND LRG (VASCULAR PRODUCTS) ×3 IMPLANT
GLIDESHEATH SLEND SS 6F .021 (SHEATH) ×3 IMPLANT
KIT HEART LEFT (KITS) ×3 IMPLANT
PACK CARDIAC CATHETERIZATION (CUSTOM PROCEDURE TRAY) ×3 IMPLANT
SYR MEDRAD MARK V 150ML (SYRINGE) ×3 IMPLANT
TRANSDUCER W/STOPCOCK (MISCELLANEOUS) ×3 IMPLANT
TUBING CIL FLEX 10 FLL-RA (TUBING) ×3 IMPLANT
WIRE SAFE-T 1.5MM-J .035X260CM (WIRE) ×3 IMPLANT

## 2014-10-03 NOTE — H&P (View-Only) (Signed)
Primary care physician: Dr. Laurian Brim  HPI  Devin Hanna is a 64 year old man who is here for a followup visit regarding coronary artery disease and cardiomyopathy.  He has known CAD with past RCA stents and prior MI, GERD, DM, HTN and HLD.  He presented in February of 2015 with sudden onset of SOB - required Lasix and Bipap - ruled in for MI -cardiac catheterization showed 99% in-stent restenosis in the right coronary artery which was heavily calcified. There was also 60-70% distal LAD stenosis. EF was 30-35% by echo. He did have left bundle branch block. RCA PCI was attempted and was initially unsuccessful due to inability to dilate the calcified lesion. I performed successful  rotational atherectomy, cutting balloon angioplasty and DES to the proximal RCA .  Echocardiogram in 09/2013 showed EF of 45-50% with mild MR.  He was hospitalized recently at [facility] with shortness of breath and severe hypoxia. He was unresponsive by EMS and responded to BiPAP. His labs showed borderline elevated troponin, borderline BNP and mild lactic acidosis. He had a repeat echocardiogram which showed improvement in LV systolic function with an ejection fraction of 50-55%. He improved with daily with furosemide. During his hospitalization, he had small amount of coffee ground emesis and dark melanotic stool. He did not require transfusion. 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 The patient has been doing well since hospital discharge with no recurrent symptoms. He is now taking Lasix 20 mg once daily instead of as needed.   No Known Allergies   Current Outpatient Prescriptions on File Prior to Visit  Medication Sig Dispense Refill  . atorvastatin (LIPITOR) 40 MG tablet Take 1 tablet (40 mg total) by mouth daily. 90 tablet 3  . calcium carbonate (TUMS - DOSED IN MG ELEMENTAL CALCIUM) 500 MG chewable tablet Chew 1 tablet by mouth as needed for indigestion or heartburn.    .  carvedilol (COREG) 6.25 MG tablet Take 1 tablet (6.25 mg total) by mouth 2 (two) times daily. 60 tablet 0  . Cinnamon 500 MG capsule Take 1,000 mg by mouth 2 (two) times daily with a meal.    . clopidogrel (PLAVIX) 75 MG tablet Take 1 tablet (75 mg total) by mouth daily with breakfast. 30 tablet 3  . furosemide (LASIX) 20 MG tablet Take 1 tablet (20 mg total) by mouth daily. 30 tablet 0  . gabapentin (NEURONTIN) 100 MG capsule Take 100 mg by mouth 3 (three) times daily.    Marland Kitchen glipiZIDE (GLUCOTROL XL) 10 MG 24 hr tablet Take 10 mg by mouth daily with breakfast.     . metFORMIN (GLUCOPHAGE) 1000 MG tablet Take 1,000 mg by mouth 2 (two) times daily with a meal.    . Multiple Vitamin (MULTIVITAMIN WITH MINERALS) TABS Take 1 tablet by mouth daily.    . nitroGLYCERIN (NITROSTAT) 0.4 MG SL tablet Place 0.4 mg under the tongue every 5 (five) minutes as needed for chest pain.    . pantoprazole (PROTONIX) 40 MG tablet Take 1 tablet (40 mg total) by mouth 2 (two) times daily. 60 tablet 0  . quinapril (ACCUPRIL) 20 MG tablet Take 1 tablet (20 mg total) by mouth daily. 30 tablet 0   No current facility-administered medications on file prior to visit.     Past Medical History  Diagnosis Date  . MI (myocardial infarction)     "has not seen cardiologist in several years"  . Diabetes mellitus     sees Dr. Hardin Negus  in [place]  . Hyperlipidemia   . Vertigo      Past Surgical History  Procedure Laterality Date  . Coronary stent placement    . External fixation leg Left 04/03/2012    Procedure: EXTERNAL FIXATION LEG;  Surgeon: Wylene Simmer, MD;  Location: [place];  Service: Orthopedics;  Laterality: Left;  . Irrigation and debridement knee Left 04/03/2012    Procedure: IRRIGATION AND DEBRIDEMENT KNEE;  Surgeon: Wylene Simmer, MD;  Location: [place];  Service: Orthopedics;  Laterality: Left;  . Orif tibia plateau Left 04/14/2012    Procedure: OPEN REDUCTION INTERNAL FIXATION (ORIF) TIBIAL PLATEAU;  Surgeon:  Wylene Simmer, MD;  Location: [place];  Service: Orthopedics;  Laterality: Left;  . External fixation removal Left 04/14/2012    Procedure: REMOVAL EXTERNAL FIXATION LEG;  Surgeon: Wylene Simmer, MD;  Location: [place];  Service: Orthopedics;  Laterality: Left;  . Left heart catheterization with coronary angiogram N/A 04/05/2013    Procedure: LEFT HEART CATHETERIZATION WITH CORONARY ANGIOGRAM;  Surgeon: [name], MD;  Location: [place] CATH LAB;  Service: Cardiovascular;  Laterality: N/A;  . Percutaneous coronary rotoblator intervention (pci-r) N/A 04/06/2013    Procedure: PERCUTANEOUS CORONARY ROTOBLATOR INTERVENTION (PCI-R);  Surgeon: [name], MD;  Location: [facility] CATH LAB;  Service: Cardiovascular;  Laterality: N/A;  . Esophagogastroduodenoscopy (egd) with propofol N/A 09/12/2014    Procedure: ESOPHAGOGASTRODUODENOSCOPY (EGD) WITH PROPOFOL;  Surgeon: Ronald Lobo, MD;  Location: [place];  Service: Endoscopy;  Laterality: N/A;     Family History  Problem Relation Age of Onset  . Coronary artery disease Brother   . Cancer Brother   . Coronary artery disease Father      Social History   Social History  . Marital Status: Single    Spouse Name: N/A  . Number of Children: N/A  . Years of Education: N/A   Occupational History  . heavy Company secretary    Social History Main Topics  . Smoking status: Former Smoker    Quit date: 04/05/1995  . Smokeless tobacco: Not on file  . Alcohol Use: Yes     Comment: Occassional Use  . Drug Use: No  . Sexual Activity: Not on file   Other Topics Concern  . Not on file   Social History Narrative   Heavy Company secretary.       Divorced      PHYSICAL EXAM   BP 142/76 mmHg  Pulse 73  Ht 5' 7"  (1.702 m)  Wt 243 lb 8 oz (110.451 kg)  BMI 38.13 kg/m2 Constitutional: He is oriented to person, place, and time. He appears well-developed and well-nourished. No distress.  HENT: No nasal discharge.  Head: Normocephalic and  atraumatic.  Eyes: Pupils are equal and round.  No discharge. Neck: Normal range of motion. Neck supple. No JVD present. No thyromegaly present.  Cardiovascular: Normal rate, regular rhythm, normal heart sounds. Exam reveals no gallop and no friction rub. There is one out of 6 systolic ejection murmur in the aortic area.  Pulmonary/Chest: Effort normal and breath sounds normal. No stridor. No respiratory distress. He has no wheezes. He has no rales. He exhibits no tenderness.  Abdominal: Soft. Bowel sounds are normal. He exhibits no distension. There is no tenderness. There is no rebound and no guarding.  Musculoskeletal: Normal range of motion. He exhibits chronic left leg edema and no tenderness.  Neurological: He is alert and oriented to person, place, and time. Coordination normal.  Skin: Skin is warm and  dry. No rash noted. He is not diaphoretic. No erythema. No pallor.  Psychiatric: He has a normal mood and affect. His behavior is normal. Judgment and thought content normal.     MVH:QIONG  Rhythm  -Nonspecific QRS widening.   -Inferior infarct -age undetermined  -Poor R-wave progression -nonspecific -consider old anterior infarct.   ABNORMAL    ASSESSMENT AND PLAN

## 2014-10-03 NOTE — Interval H&P Note (Signed)
History and Physical Interval Note:  10/03/2014 10:04 AM  Devin Hanna  has presented today for surgery, with the diagnosis of abnormal stress test  The various methods of treatment have been discussed with the patient and family. After consideration of risks, benefits and other options for treatment, the patient has consented to  Procedure(s): Left Heart Cath and Coronary Angiography (N/A) as a surgical intervention .  The patient's history has been reviewed, patient examined, no change in status, stable for surgery.  I have reviewed the patient's chart and labs.  Questions were answered to the patient's satisfaction.     [name]

## 2014-10-03 NOTE — Discharge Instructions (Signed)
Resume Metformin in 2 days.  You can resume work in 1 week  Radial Site Care Refer to this sheet in the next few weeks. These instructions provide you with information on caring for yourself after your procedure. Your caregiver may also give you more specific instructions. Your treatment has been planned according to current medical practices, but problems sometimes occur. Call your caregiver if you have any problems or questions after your procedure. HOME CARE INSTRUCTIONS  You may shower the day after the procedure.Remove the bandage (dressing) and gently wash the site with plain soap and water.Gently pat the site dry.  Do not apply powder or lotion to the site.  Do not submerge the affected site in water for 3 to 5 days.  Inspect the site at least twice daily.  Do not flex or bend the affected arm for 24 hours.  No lifting over 5 pounds (2.3 kg) for 5 days after your procedure.  Do not drive home if you are discharged the same day of the procedure. Have someone else drive you.  You may drive 24 hours after the procedure unless otherwise instructed by your caregiver.  Do not operate machinery or power tools for 24 hours.  A responsible adult should be with you for the first 24 hours after you arrive home. What to expect:  Any bruising will usually fade within 1 to 2 weeks.  Blood that collects in the tissue (hematoma) may be painful to the touch. It should usually decrease in size and tenderness within 1 to 2 weeks. SEEK IMMEDIATE MEDICAL CARE IF:  You have unusual pain at the radial site.  You have redness, warmth, swelling, or pain at the radial site.  You have drainage (other than a small amount of blood on the dressing).  You have chills.  You have a fever or persistent symptoms for more than 72 hours.  You have a fever and your symptoms suddenly get worse.  Your arm becomes pale, cool, tingly, or numb.  You have heavy bleeding from the site. Hold pressure on  the site and call 911. Document Released: 02/28/2010 Document Revised: 04/20/2011 Document Reviewed: 02/28/2010 [facility] Patient Information 2015 [place], [place]. This information is not intended to replace advice given to you by your health care provider. Make sure you discuss any questions you have with your health care provider.

## 2014-10-04 MED FILL — Heparin Sodium (Porcine) 2 Unit/ML in Sodium Chloride 0.9%: INTRAMUSCULAR | Qty: 1000 | Status: AC

## 2014-10-04 MED FILL — Nitroglycerin IV Soln 100 MCG/ML in D5W: INTRA_ARTERIAL | Qty: 10 | Status: AC

## 2014-10-26 ENCOUNTER — Ambulatory Visit: Payer: BLUE CROSS/BLUE SHIELD | Admitting: Cardiovascular Disease

## 2014-11-01 ENCOUNTER — Encounter: Payer: Self-pay | Admitting: Cardiovascular Disease

## 2014-11-01 ENCOUNTER — Ambulatory Visit (INDEPENDENT_AMBULATORY_CARE_PROVIDER_SITE_OTHER): Payer: BLUE CROSS/BLUE SHIELD | Admitting: Cardiovascular Disease

## 2014-11-01 VITALS — BP 139/69 | HR 72 | Ht 68.0 in | Wt 245.2 lb

## 2014-11-01 DIAGNOSIS — I10 Essential (primary) hypertension: Secondary | ICD-10-CM

## 2014-11-01 DIAGNOSIS — I2510 Atherosclerotic heart disease of native coronary artery without angina pectoris: Secondary | ICD-10-CM

## 2014-11-01 DIAGNOSIS — Z9889 Other specified postprocedural states: Secondary | ICD-10-CM

## 2014-11-01 DIAGNOSIS — E785 Hyperlipidemia, unspecified: Secondary | ICD-10-CM

## 2014-11-01 DIAGNOSIS — I5022 Chronic systolic (congestive) heart failure: Secondary | ICD-10-CM

## 2014-11-01 DIAGNOSIS — D500 Iron deficiency anemia secondary to blood loss (chronic): Secondary | ICD-10-CM | POA: Insufficient documentation

## 2014-11-01 MED ORDER — FERROUS SULFATE 325 (65 FE) MG PO TABS: 325.0000 mg | ORAL_TABLET | Freq: Two times a day (BID) | ORAL | Status: DC

## 2014-11-01 MED ORDER — FUROSEMIDE 40 MG PO TABS: 40.0000 mg | ORAL_TABLET | Freq: Every day | ORAL | Status: DC

## 2014-11-01 NOTE — Assessment & Plan Note (Signed)
This might be contributing to his shortness of breath. I added ferrous sulfate twice daily. Check CBC in one week.

## 2014-11-01 NOTE — Assessment & Plan Note (Signed)
Recent cardiac catheterization showed no evidence of obstructive coronary artery disease. Continue medical therapy.

## 2014-11-01 NOTE — Assessment & Plan Note (Signed)
Lab Results  Component Value Date   CHOL 103 06/07/2014   HDL 38* 06/07/2014   LDLCALC 41 06/07/2014   TRIG 121 06/07/2014   CHOLHDL 2.7 06/07/2014   Continue treatment with atorvastatin.

## 2014-11-01 NOTE — Progress Notes (Signed)
Primary care physician: Dr. Laurian Brim  HPI  Devin Hanna is a 64 year old man who is here for a followup visit regarding coronary artery disease and chronic systolic heart failure.  He has known CAD with past RCA stents and prior MI, GERD, DM, HTN and HLD.  He presented in February of 2015 with sudden onset of SOB - required Lasix and Bipap - ruled in for MI -cardiac catheterization showed 99% in-stent restenosis in the right coronary artery which was heavily calcified. There was also 60-70% distal LAD stenosis. EF was 30-35% by echo. He did have left bundle branch block. RCA PCI was attempted and was initially unsuccessful due to inability to dilate the calcified lesion. I performed successful  rotational atherectomy, cutting balloon angioplasty and DES to the proximal RCA .  Echocardiogram in 09/2013 showed EF of 45-50% with mild MR.  He was hospitalized in July with shortness of breath and severe hypoxia. He was unresponsive by EMS and responded to BiPAP. His labs showed borderline elevated troponin, borderline BNP and mild lactic acidosis.  He improved with daily with furosemide. During his hospitalization, he had small amount of coffee ground emesis and dark melanotic stool. He did not require transfusion. 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  I proceeded with outpatient nuclear stress test which showed evidence of prior inferior and inferoseptal infarct with an ejection fraction of 35-40%. Cardiac catheterization was done due to that which showed patent RCA stent with minimal restenosis with moderate calcified LAD disease. EF was 40%. Left ventricular end-diastolic pressure was 19. The patient reports no chest pain. He continues to have dyspnea with regular activities and has leg edema. He reports frequent urination at night.  No Known Allergies   Current Outpatient Prescriptions on File Prior to Visit  Medication Sig Dispense Refill  . atorvastatin  (LIPITOR) 40 MG tablet Take 1 tablet (40 mg total) by mouth daily. 90 tablet 3  . calcium carbonate (TUMS - DOSED IN MG ELEMENTAL CALCIUM) 500 MG chewable tablet Chew 1 tablet by mouth as needed for indigestion or heartburn.    . carvedilol (COREG) 6.25 MG tablet Take 1 tablet (6.25 mg total) by mouth 2 (two) times daily. 60 tablet 4  . Cinnamon 500 MG capsule Take 1,000 mg by mouth 2 (two) times daily with a meal.    . clopidogrel (PLAVIX) 75 MG tablet Take 1 tablet (75 mg total) by mouth daily with breakfast. 30 tablet 3  . furosemide (LASIX) 20 MG tablet Take 1 tablet (20 mg total) by mouth daily. 30 tablet 6  . gabapentin (NEURONTIN) 100 MG capsule Take 100 mg by mouth 3 (three) times daily.    Marland Kitchen glipiZIDE (GLUCOTROL XL) 10 MG 24 hr tablet Take 10 mg by mouth daily with breakfast.     . Multiple Vitamin (MULTIVITAMIN WITH MINERALS) TABS Take 1 tablet by mouth daily.    . nitroGLYCERIN (NITROSTAT) 0.4 MG SL tablet Place 0.4 mg under the tongue every 5 (five) minutes as needed for chest pain.    . pantoprazole (PROTONIX) 40 MG tablet Take 1 tablet (40 mg total) by mouth 2 (two) times daily. 60 tablet 3  . quinapril (ACCUPRIL) 20 MG tablet Take 1 tablet (20 mg total) by mouth daily. 30 tablet 3   No current facility-administered medications on file prior to visit.     Past Medical History  Diagnosis Date  . MI (myocardial infarction)     "has not seen cardiologist in several  years"  . Diabetes mellitus     sees Dr. Hardin Negus in [place]  . Hyperlipidemia   . Vertigo      Past Surgical History  Procedure Laterality Date  . Coronary stent placement    . External fixation leg Left 04/03/2012    Procedure: EXTERNAL FIXATION LEG;  Surgeon: Wylene Simmer, MD;  Location: [place];  Service: Orthopedics;  Laterality: Left;  . Irrigation and debridement knee Left 04/03/2012    Procedure: IRRIGATION AND DEBRIDEMENT KNEE;  Surgeon: Wylene Simmer, MD;  Location: [place];  Service: Orthopedics;   Laterality: Left;  . Orif tibia plateau Left 04/14/2012    Procedure: OPEN REDUCTION INTERNAL FIXATION (ORIF) TIBIAL PLATEAU;  Surgeon: Wylene Simmer, MD;  Location: [place];  Service: Orthopedics;  Laterality: Left;  . External fixation removal Left 04/14/2012    Procedure: REMOVAL EXTERNAL FIXATION LEG;  Surgeon: Wylene Simmer, MD;  Location: [place];  Service: Orthopedics;  Laterality: Left;  . Left heart catheterization with coronary angiogram N/A 04/05/2013    Procedure: LEFT HEART CATHETERIZATION WITH CORONARY ANGIOGRAM;  Surgeon: [name], MD;  Location: [place] CATH LAB;  Service: Cardiovascular;  Laterality: N/A;  . Percutaneous coronary rotoblator intervention (pci-r) N/A 04/06/2013    Procedure: PERCUTANEOUS CORONARY ROTOBLATOR INTERVENTION (PCI-R);  Surgeon: [name], MD;  Location: [facility] CATH LAB;  Service: Cardiovascular;  Laterality: N/A;  . Esophagogastroduodenoscopy (egd) with propofol N/A 09/12/2014    Procedure: ESOPHAGOGASTRODUODENOSCOPY (EGD) WITH PROPOFOL;  Surgeon: Ronald Lobo, MD;  Location: [place];  Service: Endoscopy;  Laterality: N/A;  . Cardiac catheterization N/A 10/03/2014    Procedure: Left Heart Cath and Coronary Angiography;  Surgeon: [name], MD;  Location: [place] CV LAB;  Service: Cardiovascular;  Laterality: N/A;     Family History  Problem Relation Age of Onset  . Coronary artery disease Brother   . Cancer Brother   . Coronary artery disease Father      Social History   Social History  . Marital Status: Single    Spouse Name: N/A  . Number of Children: N/A  . Years of Education: N/A   Occupational History  . heavy Company secretary    Social History Main Topics  . Smoking status: Former Smoker    Quit date: 04/05/1995  . Smokeless tobacco: Not on file  . Alcohol Use: Yes     Comment: Occassional Use  . Drug Use: No  . Sexual Activity: Not on file   Other Topics Concern  . Not on file   Social History Narrative   Heavy  Company secretary.       Divorced      PHYSICAL EXAM   BP 139/69 mmHg  Pulse 72  Ht 5' 8"  (1.727 m)  Wt 245 lb 4 oz (111.245 kg)  BMI 37.30 kg/m2 Constitutional: He is oriented to person, place, and time. He appears well-developed and well-nourished. No distress.  HENT: No nasal discharge.  Head: Normocephalic and atraumatic.  Eyes: Pupils are equal and round.  No discharge. Neck: Normal range of motion. Neck supple. No JVD present. No thyromegaly present.  Cardiovascular: Normal rate, regular rhythm, normal heart sounds. Exam reveals no gallop and no friction rub. There is one out of 6 systolic ejection murmur in the aortic area.  Pulmonary/Chest: Effort normal and breath sounds normal. No stridor. No respiratory distress. He has no wheezes. He has no rales. He exhibits no tenderness.  Abdominal: Soft. Bowel sounds are normal. He exhibits no distension.  There is no tenderness. There is no rebound and no guarding.  Musculoskeletal: Normal range of motion. He exhibits chronic left leg edema and no tenderness.  Neurological: He is alert and oriented to person, place, and time. Coordination normal.  Skin: Skin is warm and dry. No rash noted. He is not diaphoretic. No erythema. No pallor.  Psychiatric: He has a normal mood and affect. His behavior is normal. Judgment and thought content normal.     EKG: Sinus  Rhythm  Low voltage in limb leads.   -Nonspecific QRS widening.   -  Nonspecific T-abnormality.   ABNORMAL   ASSESSMENT AND PLAN

## 2014-11-01 NOTE — Patient Instructions (Signed)
Medication Instructions:  Your physician has recommended you make the following change in your medication:  INCREASE lasix to 84m once per day START taking ferrous sulfate 328mtwice per day   Labwork: Your physician recommends that you return for lab work in: one week. BMET, CBC   Testing/Procedures: none  Follow-Up: Your physician wants you to follow-up in: four months with Dr. ArFletcher AnonYou will receive a reminder letter in the mail two months in advance. If you don't receive a letter, please call our office to schedule the follow-up appointment.   Any Other Special Instructions Will Be Listed Below (If Applicable).

## 2014-11-01 NOTE — Assessment & Plan Note (Signed)
Blood pressure is reasonably controlled on current medications. 

## 2014-11-01 NOTE — Assessment & Plan Note (Signed)
The patient seems to be mildly fluid overloaded with continued symptoms of exertional dyspnea. His LVEDP was elevated during cardiac catheterization. Thus, I increased the dose of furosemide to 40 mg once daily. Check basic metabolic profile in one week.

## 2014-11-08 ENCOUNTER — Other Ambulatory Visit: Payer: BLUE CROSS/BLUE SHIELD

## 2014-11-08 DIAGNOSIS — Z9889 Other specified postprocedural states: Secondary | ICD-10-CM

## 2014-11-10 LAB — BASIC METABOLIC PANEL
BUN / CREAT RATIO: 8 — AB (ref 10–22)
BUN: 10 mg/dL (ref 8–27)
CALCIUM: 8.7 mg/dL (ref 8.6–10.2)
CO2: 14 mmol/L — ABNORMAL LOW (ref 18–29)
CREATININE: 1.21 mg/dL (ref 0.76–1.27)
Chloride: 98 mmol/L (ref 97–108)
GFR calc non Af Amer: 62 mL/min/{1.73_m2} (ref >59)
GFR, EST AFRICAN AMERICAN: 72 mL/min/{1.73_m2} (ref >59)
Glucose: 225 mg/dL — ABNORMAL HIGH (ref 65–99)
Potassium: 4.6 mmol/L (ref 3.5–5.2)
Sodium: 140 mmol/L (ref 134–144)

## 2014-11-10 LAB — CBC
HEMOGLOBIN: 9.2 g/dL — AB (ref 12.6–17.7)
Hematocrit: 29.8 % — ABNORMAL LOW (ref 37.5–51.0)
MCH: 23.7 pg — AB (ref 26.6–33.0)
MCHC: 30.9 g/dL — ABNORMAL LOW (ref 31.5–35.7)
MCV: 77 fL — AB (ref 79–97)
Platelets: 306 10*3/uL (ref 150–379)
RBC: 3.89 x10E6/uL — AB (ref 4.14–5.80)
RDW: 19.9 % — ABNORMAL HIGH (ref 12.3–15.4)
WBC: 8.9 10*3/uL (ref 3.4–10.8)

## 2014-12-18 ENCOUNTER — Other Ambulatory Visit: Payer: Self-pay

## 2014-12-18 MED ORDER — CLOPIDOGREL BISULFATE 75 MG PO TABS: 75.0000 mg | ORAL_TABLET | Freq: Every day | ORAL | Status: DC

## 2014-12-27 ENCOUNTER — Encounter: Payer: Self-pay | Admitting: Cardiovascular Disease

## 2014-12-27 ENCOUNTER — Ambulatory Visit (INDEPENDENT_AMBULATORY_CARE_PROVIDER_SITE_OTHER): Payer: BLUE CROSS/BLUE SHIELD | Admitting: Cardiovascular Disease

## 2014-12-27 VITALS — BP 158/90 | HR 71 | Ht 67.0 in | Wt 240.5 lb

## 2014-12-27 DIAGNOSIS — I5022 Chronic systolic (congestive) heart failure: Secondary | ICD-10-CM | POA: Diagnosis not present

## 2014-12-27 DIAGNOSIS — E785 Hyperlipidemia, unspecified: Secondary | ICD-10-CM

## 2014-12-27 DIAGNOSIS — I2510 Atherosclerotic heart disease of native coronary artery without angina pectoris: Secondary | ICD-10-CM | POA: Diagnosis not present

## 2014-12-27 DIAGNOSIS — I10 Essential (primary) hypertension: Secondary | ICD-10-CM

## 2014-12-27 MED ORDER — CARVEDILOL 12.5 MG PO TABS: 12.5000 mg | ORAL_TABLET | Freq: Two times a day (BID) | ORAL | Status: DC

## 2014-12-27 NOTE — Patient Instructions (Signed)
Medication Instructions:  Your physician has recommended you make the following change in your medication:  INCREASE coreg to 12.77m twice per day   Labwork: none  Testing/Procedures: none  Follow-Up: Your physician wants you to follow-up in: six months with Dr. [name]  You will receive a reminder letter in the mail two months in advance. If you don't receive a letter, please call our office to schedule the follow-up appointment.   Any Other Special Instructions Will Be Listed Below (If Applicable).     If you need a refill on your cardiac medications before your next appointment, please call your pharmacy.

## 2014-12-27 NOTE — Assessment & Plan Note (Signed)
His shortness of breath improved after increasing the dose of furosemide to 40 mg once daily. His blood pressure is running high and thus I elected to increase the dose of carvedilol to 12.5 mg twice daily.

## 2014-12-27 NOTE — Assessment & Plan Note (Signed)
Carvedilol was doubled as outlined above.

## 2014-12-27 NOTE — Assessment & Plan Note (Signed)
He is doing very well with no symptoms suggestive of angina. Continue medical therapy. He is on Plavix without aspirin due to recent aspirin gastropathy.

## 2014-12-27 NOTE — Progress Notes (Signed)
Primary care physician: Dr. Laurian Brim  HPI  [name] is a 64 year old man who is here for a followup visit regarding coronary artery disease and chronic systolic heart failure.  He has known CAD with past RCA stents and prior MI, GERD, DM, HTN and HLD.  He presented in February of 2015 with CHF and NSTEMI. cardiac catheterization showed 99% in-stent restenosis in the right coronary artery which was heavily calcified. There was also 60-70% distal LAD stenosis. EF was 30-35% by echo. He did have left bundle branch block. RCA PCI was attempted and was initially unsuccessful due to inability to dilate the calcified lesion. I performed successful  rotational atherectomy, cutting balloon angioplasty and DES to the proximal RCA .  Echocardiogram in 09/2013 showed EF of 45-50% with mild MR.  He was hospitalized in July, 2016 with shortness of breath and severe hypoxia. He was unresponsive by EMS and responded to BiPAP. His labs showed borderline elevated troponin, borderline BNP and mild lactic acidosis.  He improved with daily with furosemide. During his hospitalization, he had small amount of coffee ground emesis and dark melanotic stool. He did not require transfusion. 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  I proceeded with outpatient nuclear stress test which showed evidence of prior inferior and inferoseptal infarct with an ejection fraction of 35-40%. Cardiac catheterization was done due to that which showed patent RCA stent with minimal restenosis with moderate calcified LAD disease. EF was 40%. Left ventricular end-diastolic pressure was 19. During last visit, I increased the dose of furosemide to 40 mg once daily. Follow-up labs showed stable kidney function. He was still anemic with low MCV. Ferrous sulfate was added. He reports having a follow-up CBC with improvement of hemoglobin to 12.5. He has been doing very well and denies any chest pain or shortness of  breath. He reports upper respiratory tract infection symptoms over the last 5 days with some improvement since yesterday.  No Known Allergies   Current Outpatient Prescriptions on File Prior to Visit  Medication Sig Dispense Refill  . atorvastatin (LIPITOR) 40 MG tablet Take 1 tablet (40 mg total) by mouth daily. 90 tablet 3  . calcium carbonate (TUMS - DOSED IN MG ELEMENTAL CALCIUM) 500 MG chewable tablet Chew 1 tablet by mouth as needed for indigestion or heartburn.    . Cinnamon 500 MG capsule Take 1,000 mg by mouth 2 (two) times daily with a meal.    . clopidogrel (PLAVIX) 75 MG tablet Take 1 tablet (75 mg total) by mouth daily with breakfast. 30 tablet 7  . ferrous sulfate (FERROUSUL) 325 (65 FE) MG tablet Take 1 tablet (325 mg total) by mouth 2 (two) times daily with a meal. 60 tablet 5  . furosemide (LASIX) 40 MG tablet Take 1 tablet (40 mg total) by mouth daily. 30 tablet 5  . gabapentin (NEURONTIN) 100 MG capsule Take 100 mg by mouth 3 (three) times daily.    Marland Kitchen glipiZIDE (GLUCOTROL XL) 10 MG 24 hr tablet Take 10 mg by mouth daily with breakfast.     . Multiple Vitamin (MULTIVITAMIN WITH MINERALS) TABS Take 1 tablet by mouth daily.    . nitroGLYCERIN (NITROSTAT) 0.4 MG SL tablet Place 0.4 mg under the tongue every 5 (five) minutes as needed for chest pain.    . pantoprazole (PROTONIX) 40 MG tablet Take 1 tablet (40 mg total) by mouth 2 (two) times daily. 60 tablet 3  . quinapril (ACCUPRIL) 20 MG tablet Take  1 tablet (20 mg total) by mouth daily. 30 tablet 3   No current facility-administered medications on file prior to visit.     Past Medical History  Diagnosis Date  . MI (myocardial infarction) [facility])     "has not seen cardiologist in several years"  . Diabetes mellitus     sees Dr. Hardin Negus in [place]  . Hyperlipidemia   . Vertigo      Past Surgical History  Procedure Laterality Date  . Coronary stent placement    . External fixation leg Left 04/03/2012     Procedure: EXTERNAL FIXATION LEG;  Surgeon: Wylene Simmer, MD;  Location: [place];  Service: Orthopedics;  Laterality: Left;  . Irrigation and debridement knee Left 04/03/2012    Procedure: IRRIGATION AND DEBRIDEMENT KNEE;  Surgeon: Wylene Simmer, MD;  Location: [place];  Service: Orthopedics;  Laterality: Left;  . Orif tibia plateau Left 04/14/2012    Procedure: OPEN REDUCTION INTERNAL FIXATION (ORIF) TIBIAL PLATEAU;  Surgeon: Wylene Simmer, MD;  Location: [place];  Service: Orthopedics;  Laterality: Left;  . External fixation removal Left 04/14/2012    Procedure: REMOVAL EXTERNAL FIXATION LEG;  Surgeon: Wylene Simmer, MD;  Location: [place];  Service: Orthopedics;  Laterality: Left;  . Left heart catheterization with coronary angiogram N/A 04/05/2013    Procedure: LEFT HEART CATHETERIZATION WITH CORONARY ANGIOGRAM;  Surgeon: [name], MD;  Location: [place] CATH LAB;  Service: Cardiovascular;  Laterality: N/A;  . Percutaneous coronary rotoblator intervention (pci-r) N/A 04/06/2013    Procedure: PERCUTANEOUS CORONARY ROTOBLATOR INTERVENTION (PCI-R);  Surgeon: [name], MD;  Location: [facility] CATH LAB;  Service: Cardiovascular;  Laterality: N/A;  . Esophagogastroduodenoscopy (egd) with propofol N/A 09/12/2014    Procedure: ESOPHAGOGASTRODUODENOSCOPY (EGD) WITH PROPOFOL;  Surgeon: Ronald Lobo, MD;  Location: [place];  Service: Endoscopy;  Laterality: N/A;  . Cardiac catheterization N/A 10/03/2014    Procedure: Left Heart Cath and Coronary Angiography;  Surgeon: [name], MD;  Location: [place] CV LAB;  Service: Cardiovascular;  Laterality: N/A;     Family History  Problem Relation Age of Onset  . Coronary artery disease Brother   . Cancer Brother   . Coronary artery disease Father      Social History   Social History  . Marital Status: Single    Spouse Name: N/A  . Number of Children: N/A  . Years of Education: N/A   Occupational History  . heavy Company secretary    Social  History Main Topics  . Smoking status: Former Smoker    Quit date: 04/05/1995  . Smokeless tobacco: Not on file  . Alcohol Use: Yes     Comment: Occassional Use  . Drug Use: No  . Sexual Activity: Not on file   Other Topics Concern  . Not on file   Social History Narrative   Heavy Company secretary.       Divorced      PHYSICAL EXAM   BP 158/90 mmHg  Pulse 71  Ht 5' 7"  (1.702 m)  Wt 240 lb 8 oz (109.09 kg)  BMI 37.66 kg/m2 Constitutional: He is oriented to person, place, and time. He appears well-developed and well-nourished. No distress.  HENT: No nasal discharge.  Head: Normocephalic and atraumatic.  Eyes: Pupils are equal and round.  No discharge. Neck: Normal range of motion. Neck supple. No JVD present. No thyromegaly present.  Cardiovascular: Normal rate, regular rhythm, normal heart sounds. Exam reveals no gallop and no friction rub. There  is one out of 6 systolic ejection murmur in the aortic area.  Pulmonary/Chest: Effort normal and breath sounds normal. No stridor. No respiratory distress. He has no wheezes. He has no rales. He exhibits no tenderness.  Abdominal: Soft. Bowel sounds are normal. He exhibits no distension. There is no tenderness. There is no rebound and no guarding.  Musculoskeletal: Normal range of motion. He exhibits chronic left leg edema and no tenderness.  Neurological: He is alert and oriented to person, place, and time. Coordination normal.  Skin: Skin is warm and dry. No rash noted. He is not diaphoretic. No erythema. No pallor.  Psychiatric: He has a normal mood and affect. His behavior is normal. Judgment and thought content normal.     EKG: Sinus  Rhythm  Low voltage in limb leads.   -Nonspecific QRS widening.   -  Nonspecific T-abnormality.   ABNORMAL   ASSESSMENT AND PLAN

## 2014-12-27 NOTE — Assessment & Plan Note (Signed)
Lab Results  Component Value Date   CHOL 103 06/07/2014   HDL 38* 06/07/2014   LDLCALC 41 06/07/2014   TRIG 121 06/07/2014   CHOLHDL 2.7 06/07/2014   Lipid profile was optimal with an LDL of 41 with current dose of atorvastatin.

## 2015-01-16 ENCOUNTER — Other Ambulatory Visit: Payer: Self-pay

## 2015-01-16 MED ORDER — PANTOPRAZOLE SODIUM 40 MG PO TBEC: 40.0000 mg | DELAYED_RELEASE_TABLET | Freq: Two times a day (BID) | ORAL | Status: DC

## 2015-01-16 MED ORDER — QUINAPRIL HCL 20 MG PO TABS: 20.0000 mg | ORAL_TABLET | Freq: Every day | ORAL | Status: DC

## 2015-01-16 NOTE — Telephone Encounter (Signed)
Refill sent for Pantoprazole.

## 2015-01-16 NOTE — Telephone Encounter (Signed)
Refill sent for quinapril 20 mg

## 2015-02-15 ENCOUNTER — Other Ambulatory Visit: Payer: Self-pay | Admitting: *Deleted

## 2015-02-15 MED ORDER — ATORVASTATIN CALCIUM 40 MG PO TABS: 40.0000 mg | ORAL_TABLET | Freq: Every day | ORAL | Status: DC

## 2015-04-15 ENCOUNTER — Other Ambulatory Visit: Payer: Self-pay | Admitting: *Deleted

## 2015-04-15 DIAGNOSIS — Z9889 Other specified postprocedural states: Secondary | ICD-10-CM

## 2015-04-15 MED ORDER — FUROSEMIDE 40 MG PO TABS: 40.0000 mg | ORAL_TABLET | Freq: Every day | ORAL | Status: DC

## 2015-04-15 MED ORDER — QUINAPRIL HCL 20 MG PO TABS: 20.0000 mg | ORAL_TABLET | Freq: Every day | ORAL | Status: DC

## 2015-04-15 MED ORDER — PANTOPRAZOLE SODIUM 40 MG PO TBEC: 40.0000 mg | DELAYED_RELEASE_TABLET | Freq: Two times a day (BID) | ORAL | Status: DC

## 2015-04-15 NOTE — Telephone Encounter (Signed)
Requested Prescriptions   Signed Prescriptions Disp Refills  . pantoprazole (PROTONIX) 40 MG tablet 60 tablet 3    Sig: Take 1 tablet (40 mg total) by mouth 2 (two) times daily.    Authorizing Provider: [name] A    Ordering User: Eugenio Hoes, Demitria Hay C  . furosemide (LASIX) 40 MG tablet 30 tablet 3    Sig: Take 1 tablet (40 mg total) by mouth daily.    Authorizing Provider: [name] A    Ordering User: Eugenio Hoes, Eugenie Harewood C  . quinapril (ACCUPRIL) 20 MG tablet 30 tablet 3    Sig: Take 1 tablet (20 mg total) by mouth daily.    Authorizing Provider: [name] A    Ordering User: [name]

## 2015-04-19 ENCOUNTER — Ambulatory Visit (INDEPENDENT_AMBULATORY_CARE_PROVIDER_SITE_OTHER): Payer: PPO | Admitting: Family Medicine

## 2015-04-19 ENCOUNTER — Encounter: Payer: Self-pay | Admitting: Family Medicine

## 2015-04-19 VITALS — BP 108/66 | HR 72 | Temp 98.0°F | Ht 67.0 in | Wt 243.4 lb

## 2015-04-19 DIAGNOSIS — D649 Anemia, unspecified: Secondary | ICD-10-CM | POA: Diagnosis not present

## 2015-04-19 DIAGNOSIS — D500 Iron deficiency anemia secondary to blood loss (chronic): Secondary | ICD-10-CM | POA: Diagnosis not present

## 2015-04-19 DIAGNOSIS — I2510 Atherosclerotic heart disease of native coronary artery without angina pectoris: Secondary | ICD-10-CM | POA: Diagnosis not present

## 2015-04-19 DIAGNOSIS — E119 Type 2 diabetes mellitus without complications: Secondary | ICD-10-CM | POA: Diagnosis not present

## 2015-04-19 LAB — CBC
HCT: 40.6 % (ref 39.0–52.0)
Hemoglobin: 14.2 g/dL (ref 13.0–17.0)
MCHC: 34.9 g/dL (ref 30.0–36.0)
MCV: 94.6 fl (ref 78.0–100.0)
Platelets: 221 10*3/uL (ref 150.0–400.0)
RBC: 4.29 Mil/uL (ref 4.22–5.81)
RDW: 14.1 % (ref 11.5–15.5)
WBC: 9.3 10*3/uL (ref 4.0–10.5)

## 2015-04-19 LAB — COMPREHENSIVE METABOLIC PANEL
ALBUMIN: 4 g/dL (ref 3.5–5.2)
ALK PHOS: 85 U/L (ref 39–117)
ALT: 40 U/L (ref 0–53)
AST: 40 U/L — AB (ref 0–37)
BUN: 11 mg/dL (ref 6–23)
CO2: 25 mEq/L (ref 19–32)
CREATININE: 1.02 mg/dL (ref 0.40–1.50)
Calcium: 9.4 mg/dL (ref 8.4–10.5)
Chloride: 99 mEq/L (ref 96–112)
GFR: 77.41 mL/min (ref >60.00)
Glucose, Bld: 224 mg/dL — ABNORMAL HIGH (ref 70–99)
POTASSIUM: 4.5 meq/L (ref 3.5–5.1)
SODIUM: 136 meq/L (ref 135–145)
TOTAL PROTEIN: 7.1 g/dL (ref 6.0–8.3)
Total Bilirubin: 1.1 mg/dL (ref 0.2–1.2)

## 2015-04-19 LAB — HEMOGLOBIN A1C: HEMOGLOBIN A1C: 7.1 % — AB (ref 4.6–6.5)

## 2015-04-19 MED ORDER — GLIPIZIDE ER 10 MG PO TB24: 10.0000 mg | ORAL_TABLET | Freq: Every day | ORAL | Status: DC

## 2015-04-19 MED ORDER — GABAPENTIN 100 MG PO CAPS: 100.0000 mg | ORAL_CAPSULE | Freq: Three times a day (TID) | ORAL | Status: DC

## 2015-04-19 NOTE — Assessment & Plan Note (Signed)
CBGs reportedly well-controlled. Tolerating medications. We will check an A1c and CMP today. Refill on glipizide and gabapentin.

## 2015-04-19 NOTE — Assessment & Plan Note (Signed)
Stable. Asymptomatic. Continue current medication regimen. Patient will continue to follow with cardiology.

## 2015-04-19 NOTE — Assessment & Plan Note (Signed)
Patient with history of upper GI bleed and anemia related to this. Has been on iron supplementation for several months. Previous hemoglobin was 9.2. He is asymptomatic. FOBT today was negative. We will check a CBC to follow-up on this. He will continue his iron supplementation. He is given return precautions.

## 2015-04-19 NOTE — Patient Instructions (Signed)
Nice to meet you. We will refill your glipizide. We will check lab work to evaluate her diabetes. We will check lab work to evaluate your anemia. If you notice any blood in your stools, abdominal pain, chest pain, shortness breath, or any new or change in symptoms please seek medical attention.

## 2015-04-19 NOTE — Progress Notes (Signed)
Patient ID: Devin Hanna, male   DOB: [birth year], 65 y.o.   MRN: [id number]  [name], MD Phone: [phone]  Devin Hanna is a 65 y.o. male who presents today for new patient visit.  DIABETES Disease Monitoring: Blood Sugar ranges-checks intermittently typically in the 130 to 140 range Polyuria/phagia/dipsia- no      vision issues-no Medications: Compliance- taking glipizide and metformin Hypoglycemic symptoms- no  CAD: Patient is followed by cardiology for this. He reports no chest pain or trouble breathing. No palpitations. He is taking Lipitor, Coreg, Plavix, and quinapril for this. States he was taken off of aspirin given previous GI bleed.  Anemia: Patient notes he has been anemic since he was last hospitalized. He had an upper endoscopy that revealed several ulcers. His last hemoglobin was in the 9 range. He notes he has been taking iron twice a day. Notes since starting on the iron twice a day he has had on and off black stools. Does note intermittent normal stools as well. No gross blood from rectum. No abdominal pain. He has not followed up with GI in at least the past year.   Active Ambulatory Problems    Diagnosis Date Noted  . Acute respiratory failure ([place]) 04/04/2013  . CAD (coronary artery disease) 04/04/2013  . DM type 2 (diabetes mellitus, type 2) ([place]) 04/07/2013  . Other and unspecified hyperlipidemia 04/07/2013  . Morbid obesity ([place]) 04/07/2013  . HTN (hypertension) 04/07/2013  . Chronic systolic heart failure ([place]) 05/15/2013  . Hyperlipidemia 05/15/2013  . Erectile dysfunction 09/07/2013  . Upper GI bleed 09/10/2014  . Peptic ulcer disease 09/12/2014  . Blood loss anemia 11/01/2014   Resolved Ambulatory Problems    Diagnosis Date Noted  . MI (myocardial infarction) ([place])   . Acute respiratory failure with hypoxia ([place]) 04/04/2013  . Pulmonary edema 04/04/2013  . Acute pulmonary edema [facility])    Past Medical History  Diagnosis Date  . Diabetes  mellitus   . Vertigo     Family History  Problem Relation Age of Onset  . Coronary artery disease Brother   . Lung cancer Brother   . Coronary artery disease Father   . Breast cancer Sister     Social History   Social History  . Marital Status: Single    Spouse Name: N/A  . Number of Children: N/A  . Years of Education: N/A   Occupational History  . heavy Company secretary    Social History Main Topics  . Smoking status: Former Smoker    Quit date: 04/05/1995  . Smokeless tobacco: Not on file  . Alcohol Use: Yes     Comment: Occassional Use  . Drug Use: No  . Sexual Activity: Not on file   Other Topics Concern  . Not on file   Social History Narrative   Heavy Company secretary.       Divorced    ROS   General:  Negative for nexplained weight loss, fever Skin: Negative for new or changing mole, sore that won't heal HEENT: Negative for trouble hearing, trouble seeing, ringing in ears, mouth sores, hoarseness, change in voice, dysphagia. CV:  Negative for chest pain, dyspnea, edema, palpitations Resp: Negative for cough, dyspnea, hemoptysis GI: Positive for dark stools, Negative for nausea, vomiting, diarrhea, constipation, abdominal pain, hematochezia. GU: Negative for dysuria, incontinence, urinary hesitance, hematuria, vaginal or penile discharge, polyuria, sexual difficulty, lumps in testicle or breasts MSK: Negative for muscle cramps or aches, joint pain or swelling Neuro:  Negative for headaches, weakness, numbness, dizziness, passing out/fainting Psych: Negative for depression, anxiety, memory problems  Objective  Physical Exam Filed Vitals:   04/19/15 0815  BP: 108/66  Pulse: 72  Temp: 98 F (36.7 C)    BP Readings from Last 3 Encounters:  04/19/15 108/66  12/27/14 158/90  11/01/14 139/69   Wt Readings from Last 3 Encounters:  04/19/15 243 lb 6.4 oz (110.406 kg)  12/27/14 240 lb 8 oz (109.09 kg)  11/01/14 245 lb 4 oz (111.245 kg)     Physical Exam  Constitutional: He is well-developed, well-nourished, and in no distress.  HENT:  Head: Normocephalic and atraumatic.  Right Ear: External ear normal.  Left Ear: External ear normal.  Mouth/Throat: Oropharynx is clear and moist. No oropharyngeal exudate.  Eyes: Conjunctivae are normal. Pupils are equal, round, and reactive to light.  Neck: Neck supple.  Cardiovascular: Normal rate, regular rhythm and normal heart sounds.   Pulmonary/Chest: Effort normal and breath sounds normal.  Abdominal: Soft. Bowel sounds are normal. He exhibits no distension. There is no tenderness. There is no rebound and no guarding.  Genitourinary: Rectum normal and prostate normal. Guaiac negative stool.  Musculoskeletal: He exhibits no edema.  Lymphadenopathy:    He has no cervical adenopathy.  Neurological: He is alert. Gait normal.  Skin: Skin is warm and dry. He is not diaphoretic.  Psychiatric: Mood and affect normal.     Assessment/Plan:   Blood loss anemia Patient with history of upper GI bleed and anemia related to this. Has been on iron supplementation for several months. Previous hemoglobin was 9.2. He is asymptomatic. FOBT today was negative. We will check a CBC to follow-up on this. He will continue his iron supplementation. He is given return precautions.  CAD (coronary artery disease) Stable. Asymptomatic. Continue current medication regimen. Patient will continue to follow with cardiology.  DM type 2 (diabetes mellitus, type 2) CBGs reportedly well-controlled. Tolerating medications. We will check an A1c and CMP today. Refill on glipizide and gabapentin.    Orders Placed This Encounter  Procedures  . Comp Met (CMET)  . CBC  . HgB A1c    Meds ordered this encounter  Medications  . metFORMIN (GLUMETZA) 1000 MG (MOD) 24 hr tablet    Sig: Take 1,000 mg by mouth 2 (two) times daily with a meal.  . glipiZIDE (GLUCOTROL XL) 10 MG 24 hr tablet    Sig: Take 1 tablet  (10 mg total) by mouth daily with breakfast.    Dispense:  90 tablet    Refill:  3  . gabapentin (NEURONTIN) 100 MG capsule    Sig: Take 1 capsule (100 mg total) by mouth 3 (three) times daily.    Dispense:  270 capsule    Refill:  [place], MD [place]

## 2015-04-19 NOTE — Progress Notes (Signed)
Pre visit review using our clinic review tool, if applicable. No additional management support is needed unless otherwise documented below in the visit note. 

## 2015-04-22 ENCOUNTER — Telehealth: Payer: Self-pay | Admitting: *Deleted

## 2015-04-22 ENCOUNTER — Other Ambulatory Visit (INDEPENDENT_AMBULATORY_CARE_PROVIDER_SITE_OTHER): Payer: PPO

## 2015-04-22 ENCOUNTER — Other Ambulatory Visit: Payer: Self-pay | Admitting: Family Medicine

## 2015-04-22 DIAGNOSIS — R7989 Other specified abnormal findings of blood chemistry: Secondary | ICD-10-CM | POA: Diagnosis not present

## 2015-04-22 DIAGNOSIS — R945 Abnormal results of liver function studies: Principal | ICD-10-CM

## 2015-04-22 NOTE — Telephone Encounter (Signed)
Just placed the orders.

## 2015-04-22 NOTE — Telephone Encounter (Signed)
Labs and dx?  

## 2015-04-23 LAB — HEPATIC FUNCTION PANEL
ALBUMIN: 4 g/dL (ref 3.5–5.2)
ALT: 35 U/L (ref 0–53)
AST: 38 U/L — ABNORMAL HIGH (ref 0–37)
Alkaline Phosphatase: 82 U/L (ref 39–117)
Bilirubin, Direct: 0.3 mg/dL (ref 0.0–0.3)
TOTAL PROTEIN: 6.8 g/dL (ref 6.0–8.3)
Total Bilirubin: 1.1 mg/dL (ref 0.2–1.2)

## 2015-04-29 ENCOUNTER — Other Ambulatory Visit: Payer: Self-pay | Admitting: Family Medicine

## 2015-04-29 DIAGNOSIS — R945 Abnormal results of liver function studies: Principal | ICD-10-CM

## 2015-04-29 DIAGNOSIS — R7989 Other specified abnormal findings of blood chemistry: Secondary | ICD-10-CM

## 2015-05-02 ENCOUNTER — Ambulatory Visit
Admission: RE | Admit: 2015-05-02 | Discharge: 2015-05-02 | Disposition: A | Discharge status: Home / Self Care | Payer: PPO | Source: Ambulatory Visit | Attending: Family Medicine | Admitting: Family Medicine

## 2015-05-02 DIAGNOSIS — R7989 Other specified abnormal findings of blood chemistry: Secondary | ICD-10-CM | POA: Diagnosis not present

## 2015-05-02 DIAGNOSIS — R932 Abnormal findings on diagnostic imaging of liver and biliary tract: Secondary | ICD-10-CM | POA: Insufficient documentation

## 2015-05-02 DIAGNOSIS — K8020 Calculus of gallbladder without cholecystitis without obstruction: Secondary | ICD-10-CM | POA: Insufficient documentation

## 2015-05-02 DIAGNOSIS — R945 Abnormal results of liver function studies: Secondary | ICD-10-CM

## 2015-05-07 ENCOUNTER — Other Ambulatory Visit: Payer: Self-pay | Admitting: Family Medicine

## 2015-05-07 DIAGNOSIS — R932 Abnormal findings on diagnostic imaging of liver and biliary tract: Secondary | ICD-10-CM

## 2015-05-14 ENCOUNTER — Other Ambulatory Visit: Payer: Self-pay | Admitting: Student

## 2015-05-14 DIAGNOSIS — R945 Abnormal results of liver function studies: Secondary | ICD-10-CM | POA: Diagnosis not present

## 2015-05-14 DIAGNOSIS — R932 Abnormal findings on diagnostic imaging of liver and biliary tract: Secondary | ICD-10-CM | POA: Diagnosis not present

## 2015-05-14 DIAGNOSIS — D509 Iron deficiency anemia, unspecified: Secondary | ICD-10-CM | POA: Diagnosis not present

## 2015-05-14 DIAGNOSIS — Z8719 Personal history of other diseases of the digestive system: Secondary | ICD-10-CM | POA: Diagnosis not present

## 2015-05-16 ENCOUNTER — Ambulatory Visit
Admission: RE | Admit: 2015-05-16 | Discharge: 2015-05-16 | Disposition: A | Discharge status: Home / Self Care | Payer: PPO | Source: Ambulatory Visit | Attending: Student | Admitting: Student

## 2015-05-16 DIAGNOSIS — R935 Abnormal findings on diagnostic imaging of other abdominal regions, including retroperitoneum: Secondary | ICD-10-CM | POA: Diagnosis not present

## 2015-05-16 DIAGNOSIS — R945 Abnormal results of liver function studies: Secondary | ICD-10-CM | POA: Insufficient documentation

## 2015-05-16 DIAGNOSIS — R932 Abnormal findings on diagnostic imaging of liver and biliary tract: Secondary | ICD-10-CM

## 2015-06-28 ENCOUNTER — Ambulatory Visit (INDEPENDENT_AMBULATORY_CARE_PROVIDER_SITE_OTHER): Payer: PPO | Admitting: Family Medicine

## 2015-06-28 ENCOUNTER — Ambulatory Visit (INDEPENDENT_AMBULATORY_CARE_PROVIDER_SITE_OTHER): Payer: PPO | Admitting: Cardiovascular Disease

## 2015-06-28 ENCOUNTER — Encounter: Payer: Self-pay | Admitting: Cardiovascular Disease

## 2015-06-28 ENCOUNTER — Encounter: Payer: Self-pay | Admitting: Family Medicine

## 2015-06-28 VITALS — BP 104/64 | HR 68 | Ht 67.0 in | Wt 244.0 lb

## 2015-06-28 VITALS — BP 108/68 | HR 70 | Temp 98.4°F | Ht 67.0 in | Wt 245.2 lb

## 2015-06-28 DIAGNOSIS — R945 Abnormal results of liver function studies: Secondary | ICD-10-CM

## 2015-06-28 DIAGNOSIS — R7989 Other specified abnormal findings of blood chemistry: Secondary | ICD-10-CM

## 2015-06-28 DIAGNOSIS — I2510 Atherosclerotic heart disease of native coronary artery without angina pectoris: Secondary | ICD-10-CM

## 2015-06-28 DIAGNOSIS — E119 Type 2 diabetes mellitus without complications: Secondary | ICD-10-CM | POA: Diagnosis not present

## 2015-06-28 DIAGNOSIS — C609 Malignant neoplasm of penis, unspecified: Secondary | ICD-10-CM | POA: Insufficient documentation

## 2015-06-28 DIAGNOSIS — I10 Essential (primary) hypertension: Secondary | ICD-10-CM

## 2015-06-28 DIAGNOSIS — E785 Hyperlipidemia, unspecified: Secondary | ICD-10-CM | POA: Diagnosis not present

## 2015-06-28 DIAGNOSIS — N489 Disorder of penis, unspecified: Secondary | ICD-10-CM | POA: Diagnosis not present

## 2015-06-28 DIAGNOSIS — Z8549 Personal history of malignant neoplasm of other male genital organs: Secondary | ICD-10-CM | POA: Insufficient documentation

## 2015-06-28 DIAGNOSIS — N4889 Other specified disorders of penis: Secondary | ICD-10-CM

## 2015-06-28 NOTE — Progress Notes (Signed)
Patient ID: Devin Hanna, male   DOB: [birth year], 65 y.o.   MRN: [id number]  Devin Rumps, MD Phone: [phone]  [name] is a 65 y.o. male who presents today for follow-up.  DIABETES Disease Monitoring: Blood Sugar ranges-135-140 Polyuria/phagia/dipsia- no      ophthalmology-has not seen in greater than 2 years, is going to make an appointment  Medications: Compliance- taking glipizide and metformin Hypoglycemic symptoms- rarely, will eat something and then feels better  HYPERLIPIDEMIA Symptoms Chest pain on exertion:  No   Leg claudication:   No Medications: Compliance- taking Lipitor Right upper quadrant pain- no  Muscle aches- no  Elevated LFTs: Following up with GI. Is supposed to see a specialist in the next several months. No right upper quadrant pain.  Patient notes over the last 1-2 weeks he has noted a white bump on the tip of his foreskin on his penis. Notes it occasionally burns and itches. He is unable to retract his foreskin as well and notes he has been unable to do this for a number of years. Notes sometimes he has difficulty stopping urinating and has some urinary stream issues. No history of prostate issues.   PMH: Former smoker ROS see history of present illness  Objective  Physical Exam Filed Vitals:   06/28/15 0946  BP: 108/68  Pulse: 70  Temp: 98.4 F (36.9 C)    BP Readings from Last 3 Encounters:  06/28/15 108/68  06/28/15 104/64  04/19/15 108/66   Wt Readings from Last 3 Encounters:  06/28/15 245 lb 3.2 oz (111.222 kg)  06/28/15 244 lb (110.678 kg)  04/19/15 243 lb 6.4 oz (110.406 kg)    Physical Exam  Constitutional: He is well-developed, well-nourished, and in no distress.  HENT:  Head: Normocephalic and atraumatic.  Right Ear: External ear normal.  Left Ear: External ear normal.  Cardiovascular: Normal rate, regular rhythm and normal heart sounds.   Pulmonary/Chest: Effort normal and breath sounds normal.  Abdominal:  Soft. Bowel sounds are normal. He exhibits no distension. There is no tenderness. There is no rebound and no guarding.  Genitourinary:  Left tip of foreskin on penis with 1 cm white raised lesion that is nontender and nonerythematous, unable to retract the foreskin, no penile tenderness, normal testicles and scrotum, no inguinal hernias  Neurological: He is alert. Gait normal.  Left foot with mild decreased monofilament sensation, right foot with normal monofilament sensation, bilateral lower extremities with normal light touch sensation, 2+ DP pulses, no ulcerations or skin lesions noted on bilateral feet  Skin: Skin is warm and dry. He is not diaphoretic.     Assessment/Plan: Please see individual problem list.  DM type 2 (diabetes mellitus, type 2) Blood sugars appear to be at goal. He'll continue current medication. He will return some time in the middle of next month for an A1c. Urine microalbumin at that time as well. Foot exam completed today.  Hyperlipidemia Tolerating medication. Continue Lipitor.  Elevated LFTs Has seen GI. Is to continue to follow-up with them.  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 Could be genital wart as well. Regardless given this and his inability to retract his foreskin he needs evaluation by urology. Referral has been placed and we will hopefully get him seen next week.    Orders Placed This Encounter  Procedures  . HgB A1c    Standing Status: Future     Number of Occurrences:  Standing Expiration Date: 06/27/2016  . Urine Microalbumin w/creat. ratio    Standing Status: Future     Number of Occurrences:      Standing Expiration Date: 06/27/2016  . Ambulatory referral to Urology    Referral Priority:  Routine    Referral Type:  Consultation    Referral Reason:  Specialty Services Required    Requested Specialty:  Urology    Number of Visits Requested:  [place], MD [place]

## 2015-06-28 NOTE — Patient Instructions (Signed)
Nice to see you. Please continue your current diabetic regimen. We're going to refer you to urology for evaluation of the lesion on your penis.

## 2015-06-28 NOTE — Assessment & Plan Note (Signed)
Tolerating medication. Continue Lipitor.

## 2015-06-28 NOTE — Assessment & Plan Note (Signed)
Blood sugars appear to be at goal. He'll continue current medication. He will return some time in the middle of next month for an A1c. Urine microalbumin at that time as well. Foot exam completed today.

## 2015-06-28 NOTE — Progress Notes (Signed)
Pre visit review using our clinic review tool, if applicable. No additional management support is needed unless otherwise documented below in the visit note. 

## 2015-06-28 NOTE — Progress Notes (Signed)
Cardiology Office Note   Date:  06/28/2015   ID:  [name], DOB [birth year], MRN [id number]  PCP:  [name], MD  Cardiologist:   [name], MD   Chief Complaint  Patient presents with  . other    6 month follow up. Meds reviewed by the patient verbally. "doing well." Pt. has an abnormal liver u/s.        History of Present Illness: Devin Hanna is a 65 y.o. male who presents for a followup visit regarding coronary artery disease and chronic systolic heart failure.  He has known CAD with past RCA stents and prior MI, GERD, DM, HTN and HLD.  He presented in February of 2015 with CHF and NSTEMI. cardiac catheterization showed 99% in-stent restenosis in the right coronary artery which was heavily calcified. There was also 60-70% distal LAD stenosis. EF was 30-35% by echo. He did have left bundle branch block. RCA PCI was attempted and was initially unsuccessful due to inability to dilate the calcified lesion. I performed successful  rotational atherectomy, cutting balloon angioplasty and DES to the proximal RCA .  Echocardiogram in 09/2013 showed EF of 45-50% with mild MR.  He was hospitalized in July, 2016 with shortness of breath and severe hypoxia. He was unresponsive by EMS and responded to BiPAP. His labs showed borderline elevated troponin, borderline BNP and mild lactic acidosis.  He improved with diuresis. During his hospitalization, he had small amount of coffee ground emesis and dark melanotic stool. He did not require transfusion. 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  Nuclear stress test in August 2016 showed evidence of prior inferior and inferoseptal infarct with an ejection fraction of 35-40%. Cardiac catheterization was done due to that which showed patent RCA stent with minimal restenosis with moderate calcified LAD disease. EF was 40%. Left ventricular end-diastolic pressure was 19. He has been doing well overall with no  recurrent chest pain. He has stable exertional dyspnea which typically gets worse in the hot weather. He was found to have mildly elevated LFTs and has been seen by GI. His anemia improved to normal.   Past Medical History  Diagnosis Date  . MI (myocardial infarction) [facility])     "has not seen cardiologist in several years"  . Diabetes mellitus     sees Dr. Hardin Negus in [place]  . Hyperlipidemia   . Vertigo     Past Surgical History  Procedure Laterality Date  . Coronary stent placement    . External fixation leg Left 04/03/2012    Procedure: EXTERNAL FIXATION LEG;  Surgeon: Wylene Simmer, MD;  Location: [place];  Service: Orthopedics;  Laterality: Left;  . Irrigation and debridement knee Left 04/03/2012    Procedure: IRRIGATION AND DEBRIDEMENT KNEE;  Surgeon: Wylene Simmer, MD;  Location: [place];  Service: Orthopedics;  Laterality: Left;  . Orif tibia plateau Left 04/14/2012    Procedure: OPEN REDUCTION INTERNAL FIXATION (ORIF) TIBIAL PLATEAU;  Surgeon: Wylene Simmer, MD;  Location: [place];  Service: Orthopedics;  Laterality: Left;  . External fixation removal Left 04/14/2012    Procedure: REMOVAL EXTERNAL FIXATION LEG;  Surgeon: Wylene Simmer, MD;  Location: [place];  Service: Orthopedics;  Laterality: Left;  . Left heart catheterization with coronary angiogram N/A 04/05/2013    Procedure: LEFT HEART CATHETERIZATION WITH CORONARY ANGIOGRAM;  Surgeon: [name], MD;  Location: [place] CATH LAB;  Service: Cardiovascular;  Laterality: N/A;  . Percutaneous coronary rotoblator intervention (pci-r) N/A  04/06/2013    Procedure: PERCUTANEOUS CORONARY ROTOBLATOR INTERVENTION (PCI-R);  Surgeon: [name], MD;  Location: [facility] C[facility] LAB;  Service: Cardiovascular;  Laterality: N/A;  . Esophagogastroduodenoscopy (egd) with propofol N/A 09/12/2014    Procedure: ESOPHAGOGASTRODUODENOSCOPY (EGD) WITH PROPOFOL;  Surgeon: Ronald Lobo, MD;  Location: [place];  Service: Endoscopy;  Laterality: N/A;  . Cardiac  catheterization N/A 10/03/2014    Procedure: Left Heart Cath and Coronary Angiography;  Surgeon: [name], MD;  Location: [place] CV LAB;  Service: Cardiovascular;  Laterality: N/A;     Current Outpatient Prescriptions  Medication Sig Dispense Refill  . atorvastatin (LIPITOR) 40 MG tablet Take 1 tablet (40 mg total) by mouth daily. 90 tablet 3  . carvedilol (COREG) 12.5 MG tablet Take 1 tablet (12.5 mg total) by mouth 2 (two) times daily with a meal. 180 tablet 3  . Cinnamon 500 MG capsule Take 1,000 mg by mouth 2 (two) times daily with a meal.    . clopidogrel (PLAVIX) 75 MG tablet Take 1 tablet (75 mg total) by mouth daily with breakfast. 30 tablet 7  . ferrous sulfate (FERROUSUL) 325 (65 FE) MG tablet Take 1 tablet (325 mg total) by mouth 2 (two) times daily with a meal. 60 tablet 5  . furosemide (LASIX) 40 MG tablet Take 1 tablet (40 mg total) by mouth daily. 30 tablet 3  . gabapentin (NEURONTIN) 100 MG capsule Take 1 capsule (100 mg total) by mouth 3 (three) times daily. 270 capsule 3  . glipiZIDE (GLUCOTROL XL) 10 MG 24 hr tablet Take 1 tablet (10 mg total) by mouth daily with breakfast. 90 tablet 3  . metFORMIN (GLUMETZA) 1000 MG (MOD) 24 hr tablet Take 1,000 mg by mouth 2 (two) times daily with a meal.    . Multiple Vitamin (MULTIVITAMIN WITH MINERALS) TABS Take 1 tablet by mouth daily.    . nitroGLYCERIN (NITROSTAT) 0.4 MG SL tablet Place 0.4 mg under the tongue every 5 (five) minutes as needed for chest pain.    . pantoprazole (PROTONIX) 40 MG tablet Take 1 tablet (40 mg total) by mouth 2 (two) times daily. 60 tablet 3  . quinapril (ACCUPRIL) 20 MG tablet Take 1 tablet (20 mg total) by mouth daily. 30 tablet 3   No current facility-administered medications for this visit.    Allergies:   Review of patient's allergies indicates no known allergies.    Social History:  The patient  reports that he quit smoking about 20 years ago. He does not have any smokeless tobacco  history on file. He reports that he drinks alcohol. He reports that he does not use illicit drugs.   Family History:  The patient's family history includes Breast cancer in his sister; Coronary artery disease in his brother and father; Lung cancer in his brother.    ROS:  Please see the history of present illness.   Otherwise, review of systems are positive for none.   All other systems are reviewed and negative.    PHYSICAL EXAM: VS:  BP 104/64 mmHg  Pulse 68  Ht 5' 7"  (1.702 m)  Wt 244 lb (110.678 kg)  BMI 38.21 kg/m2 , BMI Body mass index is 38.21 kg/(m^2). GEN: Well nourished, well developed, in no acute distress HEENT: normal Neck: no JVD, carotid bruits, or masses Cardiac: RRR; no  rubs, or gallops. . 2/6 systolic ejection murmur in the aortic area which is early peaking . Mild leg edema Respiratory:  clear to auscultation bilaterally,  normal work of breathing GI: soft, nontender, nondistended, + BS MS: no deformity or atrophy Skin: warm and dry, no rash Neuro:  Strength and sensation are intact Psych: euthymic mood, full affect   EKG:  EKG is ordered today. The ekg ordered today demonstrates normal sinus rhythm with left bundle branch block.   Recent Labs: 09/08/2014: B Natriuretic Peptide 296.0* 09/09/2014: Magnesium 2.0; TSH 0.889 04/19/2015: BUN 11; Creatinine, Ser 1.02; Hemoglobin 14.2; Platelets 221.0; Potassium 4.5; Sodium 136 04/22/2015: ALT 35    Lipid Panel    Component Value Date/Time   CHOL 103 06/07/2014 0824   TRIG 121 06/07/2014 0824   HDL 38* 06/07/2014 0824   CHOLHDL 2.7 06/07/2014 0824   LDLCALC 41 06/07/2014 0824      Wt Readings from Last 3 Encounters:  06/28/15 244 lb (110.678 kg)  04/19/15 243 lb 6.4 oz (110.406 kg)  12/27/14 240 lb 8 oz (109.09 kg)        ASSESSMENT AND PLAN:  1.  Coronary artery disease involving native coronary arteries without angina:: He is doing reasonably well with no anginal symptoms. Continue medical therapy.  He is on Plavix without aspirin due to gastric ulcers last year.  2. Chronic systolic heart failure: He appears to be euvolemic on current dose of furosemide. Most recent labs showed close to normal renal function.  3. Essential hypertension: Blood pressure is now well controlled especially after increasing the dose of carvedilol.  4. Hyperlipidemia: Most recent lipid profile showed an LDL of 41. Continue treatment with atorvastatin.    Disposition:   FU with me in 6 months  Signed,  [name], MD  06/28/2015 8:37 AM    [place]

## 2015-06-28 NOTE — Assessment & Plan Note (Signed)
Has seen GI. Is to continue to follow-up with them.

## 2015-06-28 NOTE — Patient Instructions (Signed)
Medication Instructions: Continue same medications.   Labwork: None.   Procedures/Testing: None.   Follow-Up: 6 months with Dr. Lavelle Akel.   Any Additional Special Instructions Will Be Listed Below (If Applicable).     If you need a refill on your cardiac medications before your next appointment, please call your pharmacy.   

## 2015-06-28 NOTE — Assessment & Plan Note (Signed)
Patient with lesion on the tip of his foreskin worrisome for malignant process. Could be genital wart as well. Regardless given this and his inability to retract his foreskin he needs evaluation by urology. Referral has been placed and we will hopefully get him seen next week.

## 2015-07-04 ENCOUNTER — Telehealth: Payer: Self-pay | Admitting: Cardiovascular Disease

## 2015-07-04 ENCOUNTER — Telehealth: Payer: Self-pay | Admitting: Radiology

## 2015-07-04 ENCOUNTER — Encounter: Payer: Self-pay | Admitting: Urology

## 2015-07-04 ENCOUNTER — Ambulatory Visit (INDEPENDENT_AMBULATORY_CARE_PROVIDER_SITE_OTHER): Payer: PPO | Admitting: Urology

## 2015-07-04 VITALS — BP 136/75 | HR 73 | Ht 66.0 in | Wt 244.0 lb

## 2015-07-04 DIAGNOSIS — N471 Phimosis: Secondary | ICD-10-CM | POA: Diagnosis not present

## 2015-07-04 DIAGNOSIS — N489 Disorder of penis, unspecified: Secondary | ICD-10-CM

## 2015-07-04 DIAGNOSIS — N4889 Other specified disorders of penis: Secondary | ICD-10-CM

## 2015-07-04 NOTE — Progress Notes (Signed)
07/04/2015 9:15 AM   [place] 1950-04-04 [id number]  Referring provider: [name], MD [address] [place], [place] [address]  Chief Complaint  Patient presents with  . Penile Mass    New Patient    HPI: 65 year old male who presents today for further evaluation of a penile mass. He first noticed a white, nodular mass on the tip of his foreskin and approximately 3-4 weeks ago. Since stable in size. He does endorse slight soreness of the area. He has been unable to retract his foreskin fully for many years. He is uncircumcised.  He has no difficulty voiding. He does get up a few times at night but this does not bother him. Stream is adequate. No history of urinary tract infections. No history of gross hematuria.  He does take Plavix and has a history of CAD s/p MI, CHF.  He underwent revision of a cardiac stent a [place] over a year ago. His cardiologist is Dr. Carlena Bjornstad. He has never come off his Plavix for any reason.  PMH: Past Medical History  Diagnosis Date  . MI (myocardial infarction) [facility])     "has not seen cardiologist in several years"  . Diabetes mellitus     sees Dr. Hardin Negus in [place]  . Hyperlipidemia   . Vertigo   . Congestive heart failure ([place])   . GERD (gastroesophageal reflux disease)     Surgical History: Past Surgical History  Procedure Laterality Date  . Coronary stent placement    . External fixation leg Left 04/03/2012    Procedure: EXTERNAL FIXATION LEG;  Surgeon: Wylene Simmer, MD;  Location: [place];  Service: Orthopedics;  Laterality: Left;  . Irrigation and debridement knee Left 04/03/2012    Procedure: IRRIGATION AND DEBRIDEMENT KNEE;  Surgeon: Wylene Simmer, MD;  Location: [place];  Service: Orthopedics;  Laterality: Left;  . Orif tibia plateau Left 04/14/2012    Procedure: OPEN REDUCTION INTERNAL FIXATION (ORIF) TIBIAL PLATEAU;  Surgeon: Wylene Simmer, MD;  Location: [place];  Service: Orthopedics;  Laterality: Left;  . External  fixation removal Left 04/14/2012    Procedure: REMOVAL EXTERNAL FIXATION LEG;  Surgeon: Wylene Simmer, MD;  Location: [place];  Service: Orthopedics;  Laterality: Left;  . Left heart catheterization with coronary angiogram N/A 04/05/2013    Procedure: LEFT HEART CATHETERIZATION WITH CORONARY ANGIOGRAM;  Surgeon: [name], MD;  Location: [place] CATH LAB;  Service: Cardiovascular;  Laterality: N/A;  . Percutaneous coronary rotoblator intervention (pci-r) N/A 04/06/2013    Procedure: PERCUTANEOUS CORONARY ROTOBLATOR INTERVENTION (PCI-R);  Surgeon: [name], MD;  Location: [facility] CATH LAB;  Service: Cardiovascular;  Laterality: N/A;  . Esophagogastroduodenoscopy (egd) with propofol N/A 09/12/2014    Procedure: ESOPHAGOGASTRODUODENOSCOPY (EGD) WITH PROPOFOL;  Surgeon: Ronald Lobo, MD;  Location: [place];  Service: Endoscopy;  Laterality: N/A;  . Cardiac catheterization N/A 10/03/2014    Procedure: Left Heart Cath and Coronary Angiography;  Surgeon: [name], MD;  Location: [place] CV LAB;  Service: Cardiovascular;  Laterality: N/A;    Home Medications:    Medication List       This list is accurate as of: 07/04/15  9:15 AM.  Always use your most recent med list.               atorvastatin 40 MG tablet  Commonly known as:  LIPITOR  Take 1 tablet (40 mg total) by mouth daily.     carvedilol 12.5 MG tablet  Commonly known  as:  COREG  Take 1 tablet (12.5 mg total) by mouth 2 (two) times daily with a meal.     Cinnamon 500 MG capsule  Take 1,000 mg by mouth 2 (two) times daily with a meal.     clopidogrel 75 MG tablet  Commonly known as:  PLAVIX  Take 1 tablet (75 mg total) by mouth daily with breakfast.     ferrous sulfate 325 (65 FE) MG tablet  Commonly known as:  FERROUSUL  Take 1 tablet (325 mg total) by mouth 2 (two) times daily with a meal.     furosemide 40 MG tablet  Commonly known as:  LASIX  Take 1 tablet (40 mg total) by mouth daily.     gabapentin 100  MG capsule  Commonly known as:  NEURONTIN  Take 1 capsule (100 mg total) by mouth 3 (three) times daily.     glipiZIDE 10 MG 24 hr tablet  Commonly known as:  GLUCOTROL XL  Take 1 tablet (10 mg total) by mouth daily with breakfast.     metFORMIN 1000 MG (MOD) 24 hr tablet  Commonly known as:  GLUMETZA  Take 1,000 mg by mouth 2 (two) times daily with a meal.     multivitamin with minerals Tabs tablet  Take 1 tablet by mouth daily.     nitroGLYCERIN 0.4 MG SL tablet  Commonly known as:  NITROSTAT  Place 0.4 mg under the tongue every 5 (five) minutes as needed for chest pain.     pantoprazole 40 MG tablet  Commonly known as:  PROTONIX  Take 1 tablet (40 mg total) by mouth 2 (two) times daily.     quinapril 20 MG tablet  Commonly known as:  ACCUPRIL  Take 1 tablet (20 mg total) by mouth daily.        Allergies: No Known Allergies  Family History: Family History  Problem Relation Age of Onset  . Coronary artery disease Brother   . Lung cancer Brother   . Coronary artery disease Father   . Breast cancer Sister   . Prostate cancer Brother   . Stroke Sister     Social History:  reports that he quit smoking about 20 years ago. He does not have any smokeless tobacco history on file. He reports that he drinks alcohol. He reports that he does not use illicit drugs.  ROS: UROLOGY Frequent Urination?: No Hard to postpone urination?: Yes Burning/pain with urination?: No Get up at night to urinate?: Yes Leakage of urine?: Yes Urine stream starts and stops?: No Trouble starting stream?: No Do you have to strain to urinate?: No Blood in urine?: No Urinary tract infection?: No Sexually transmitted disease?: No Injury to kidneys or bladder?: No Painful intercourse?: No Weak stream?: No Erection problems?: Yes Penile pain?: No  Gastrointestinal Nausea?: No Vomiting?: No Indigestion/heartburn?: No Diarrhea?: No Constipation?: No  Constitutional Fever: No Night  sweats?: No Weight loss?: No Fatigue?: No  Skin Skin rash/lesions?: No Itching?: No  Eyes Blurred vision?: No Double vision?: No  Ears/Nose/Throat Sore throat?: No Sinus problems?: No  Hematologic/Lymphatic Swollen glands?: No Easy bruising?: Yes  Cardiovascular Leg swelling?: No Chest pain?: No  Respiratory Cough?: No Shortness of breath?: No  Endocrine Excessive thirst?: No  Musculoskeletal Back pain?: No Joint pain?: No  Neurological Headaches?: No Dizziness?: No  Psychologic Depression?: No Anxiety?: No  Physical Exam: There were no vitals taken for this visit.  Constitutional:  Alert and oriented, No acute distress. HEENT: [place]  AT, moist mucus membranes.  Trachea midline, no masses. Cardiovascular: No clubbing, cyanosis, or edema. Respiratory: Normal respiratory effort, no increased work of breathing. GI: Abdomen is soft, nontender, nondistended, no abdominal masses.  Obese, protuberant. GU: Uncircumcised phallus with significant phimosis, unable to fully retract foreskin to evaluate glands. There is a whitish, verruca's appearing lesion measuring approximately 1 cm on the distalmost aspect of the foreskin. Upon attempting to retract the foreskin, there is a much larger beefy, verucuss mass measuring approximately 4 cm within the foreskin highly concerning for penile malignancy. The glans is unable to be completely evaluated given the severe phimosis. Testicles descended bilaterally, no masses or tenderness. Skin: No rashes, bruises or suspicious lesions. Lymph: No inguinal adenopathy. Neurologic: Grossly intact, no focal deficits, moving all 4 extremities. Psychiatric: Normal mood and affect.  Laboratory Data: Lab Results  Component Value Date   WBC 9.3 04/19/2015   HGB 14.2 04/19/2015   HCT 40.6 04/19/2015   MCV 94.6 04/19/2015   PLT 221.0 04/19/2015    Lab Results  Component Value Date   CREATININE 1.02 04/19/2015    Lab Results  Component  Value Date   HGBA1C 7.1* 04/19/2015    Assessment & Plan:    1. 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  I have recommended excision via circumcision in the operating room.  We discussed the risks of the procedure in detail including risk of bleeding, infection, damage to surrounding structures, dissatisfaction with cosmesis, penile sensitivity, and possible need for further intervention pending further evaluation/staging.  He will need to be evaluated by his cardiologist and his Plavix will need to be held 7 days prior to the procedure.  2. Phimosis Recommend circumcision as above   Schedule circ/ excision of penile mass   Hollice Espy, MD  [place] [address] [place], [place] [address] [phone]

## 2015-07-04 NOTE — Telephone Encounter (Signed)
Notified pt's daughter of surgery scheduled 07/22/15, pre-admit testing appt on 07/09/15 @1 :85 & to call Friday prior to surgery for arrival time to [facility]. Will notify pt when to stop Plavix once clearance is obtained from Dr Fletcher Anon. Daughter voices understanding.

## 2015-07-04 NOTE — Telephone Encounter (Signed)
Surgical clearance for 07/22/15 procedure at [facility] received and placed in MD box.

## 2015-07-07 ENCOUNTER — Encounter: Payer: Self-pay | Admitting: Urology

## 2015-07-09 ENCOUNTER — Encounter
Admission: RE | Admit: 2015-07-09 | Discharge: 2015-07-09 | Disposition: A | Discharge status: Home / Self Care | Payer: PPO | Source: Ambulatory Visit | Attending: Urology | Admitting: Urology

## 2015-07-09 DIAGNOSIS — Z01812 Encounter for preprocedural laboratory examination: Secondary | ICD-10-CM | POA: Diagnosis not present

## 2015-07-09 HISTORY — DX: Polyneuropathy, unspecified: G62.9

## 2015-07-09 LAB — CBC
HEMATOCRIT: 40 % (ref 40.0–52.0)
Hemoglobin: 13.7 g/dL (ref 13.0–18.0)
MCH: 32.6 pg (ref 26.0–34.0)
MCHC: 34.3 g/dL (ref 32.0–36.0)
MCV: 95.1 fL (ref 80.0–100.0)
PLATELETS: 176 10*3/uL (ref 150–440)
RBC: 4.21 MIL/uL — ABNORMAL LOW (ref 4.40–5.90)
RDW: 13 % (ref 11.5–14.5)
WBC: 8.4 10*3/uL (ref 3.8–10.6)

## 2015-07-09 LAB — BASIC METABOLIC PANEL
Anion gap: 12 (ref 5–15)
BUN: 12 mg/dL (ref 6–20)
CALCIUM: 9 mg/dL (ref 8.9–10.3)
CO2: 25 mmol/L (ref 22–32)
CREATININE: 0.85 mg/dL (ref 0.61–1.24)
Chloride: 97 mmol/L — ABNORMAL LOW (ref 101–111)
GFR calc Af Amer: >60 mL/min (ref >60)
GLUCOSE: 240 mg/dL — AB (ref 65–99)
Potassium: 3.8 mmol/L (ref 3.5–5.1)
SODIUM: 134 mmol/L — AB (ref 135–145)

## 2015-07-09 NOTE — Patient Instructions (Signed)
Your procedure is scheduled on: Monday 07/22/15  Report to Day Surgery. 2ND FLOOR MEDICAL MALL ENTRANCE To find out your arrival time please call [phone] between 1PM - 3PM on Friday 07/19/15.  Remember: Instructions that are not followed completely may result in serious medical risk, up to and including death, or upon the discretion of your surgeon and anesthesiologist your surgery may need to be rescheduled.    __X__ 1. Do not eat food or drink liquids after midnight. No gum chewing or hard candies.     __X__ 2. No Alcohol for 24 hours before or after surgery.   ____ 3. Bring all medications with you on the day of surgery if instructed.    __X__ 4. Notify your doctor if there is any change in your medical condition     (cold, fever, infections).     Do not wear jewelry, make-up, hairpins, clips or nail polish.  Do not wear lotions, powders, or perfumes.   Do not shave 48 hours prior to surgery. Men may shave face and neck.  Do not bring valuables to the hospital.    [facility] is not responsible for any belongings or valuables.               Contacts, dentures or bridgework may not be worn into surgery.  Leave your suitcase in the car. After surgery it may be brought to your room.  For patients admitted to the hospital, discharge time is determined by your                treatment team.   Patients discharged the day of surgery will not be allowed to drive home.   Please read over the following fact sheets that you were given:   Surgical Site Infection Prevention   __X__ Take these medicines the morning of surgery with A SIP OF WATER:    1. ATORVASTATIN  2. CARVEDILOL  3. GABAPENTIN  4. PANTOPRAZOLE  5. QUINIPRIL  6.  ____ Fleet Enema (as directed)   ____ Use CHG Soap as directed  ____ Use inhalers on the day of surgery  ____ Stop metformin 2 days prior to surgery    ____ Take 1/2 of usual insulin dose the night before surgery and none on the morning of surgery.    __X__ Stop Coumadin/Plavix/aspirin on AS DIRECTED BY CARDIOLOGIST  ____ Stop Anti-inflammatories on    ____ Stop supplements until after surgery.    ____ Bring C-Pap to the hospital.

## 2015-07-11 NOTE — Telephone Encounter (Signed)
Faxed clearance to [facility], attnWarren Lacy, [phone]

## 2015-07-15 NOTE — Telephone Encounter (Signed)
Notified daughter that pt has been cleared for surgery by Dr Fletcher Anon & advised that pt should hold Plavix beginning today 07/15/15. Daughter voices understanding.

## 2015-07-16 NOTE — Pre-Procedure Instructions (Signed)
Cleared by Dr Fletcher Anon 07/11/15 low risk

## 2015-07-22 ENCOUNTER — Ambulatory Visit: Payer: PPO | Admitting: Anesthesiology

## 2015-07-22 ENCOUNTER — Encounter
Admission: RE | Disposition: A | Discharge status: Home / Self Care | Payer: Self-pay | Source: Ambulatory Visit | Attending: Urology

## 2015-07-22 ENCOUNTER — Ambulatory Visit
Admission: RE | Admit: 2015-07-22 | Discharge: 2015-07-22 | Disposition: A | Discharge status: Home / Self Care | Payer: PPO | Source: Ambulatory Visit | Attending: Urology | Admitting: Urology

## 2015-07-22 ENCOUNTER — Encounter: Payer: Self-pay | Admitting: *Deleted

## 2015-07-22 DIAGNOSIS — C600 Malignant neoplasm of prepuce: Secondary | ICD-10-CM | POA: Diagnosis not present

## 2015-07-22 DIAGNOSIS — N4889 Other specified disorders of penis: Secondary | ICD-10-CM

## 2015-07-22 DIAGNOSIS — L280 Lichen simplex chronicus: Secondary | ICD-10-CM | POA: Insufficient documentation

## 2015-07-22 DIAGNOSIS — N471 Phimosis: Secondary | ICD-10-CM | POA: Diagnosis not present

## 2015-07-22 DIAGNOSIS — C609 Malignant neoplasm of penis, unspecified: Secondary | ICD-10-CM | POA: Diagnosis not present

## 2015-07-22 HISTORY — PX: MASS EXCISION: SHX2000

## 2015-07-22 HISTORY — PX: CIRCUMCISION: SHX1350

## 2015-07-22 LAB — GLUCOSE, CAPILLARY
Glucose-Capillary: 234 mg/dL — ABNORMAL HIGH (ref 65–99)
Glucose-Capillary: 219 mg/dL — ABNORMAL HIGH (ref 65–99)

## 2015-07-22 SURGERY — CIRCUMCISION, ADULT
Anesthesia: General

## 2015-07-22 MED ORDER — MIDAZOLAM HCL 2 MG/2ML IJ SOLN
INTRAMUSCULAR | Status: DC | PRN
  Administered 2015-07-22: 2 mg via INTRAVENOUS

## 2015-07-22 MED ORDER — PROPOFOL 10 MG/ML IV BOLUS
INTRAVENOUS | Status: DC | PRN
Start: 2015-07-22 — End: 2015-07-22
  Administered 2015-07-22: 160 mg via INTRAVENOUS

## 2015-07-22 MED ORDER — CEFAZOLIN SODIUM-DEXTROSE 2-4 GM/100ML-% IV SOLN: 2.0000 g | Freq: Once | INTRAVENOUS | Status: DC

## 2015-07-22 MED ORDER — ONDANSETRON HCL 4 MG/2ML IJ SOLN
INTRAMUSCULAR | Status: DC | PRN
  Administered 2015-07-22: 4 mg via INTRAVENOUS

## 2015-07-22 MED ORDER — CEFAZOLIN SODIUM-DEXTROSE 2-4 GM/100ML-% IV SOLN
INTRAVENOUS | Status: AC
  Filled 2015-07-22: qty 100

## 2015-07-22 MED ORDER — LIDOCAINE HCL 1 % IJ SOLN
INTRAMUSCULAR | Status: DC | PRN
  Administered 2015-07-22: 20 mL

## 2015-07-22 MED ORDER — LIDOCAINE HCL (PF) 1 % IJ SOLN
INTRAMUSCULAR | Status: AC
  Filled 2015-07-22: qty 30

## 2015-07-22 MED ORDER — DOCUSATE SODIUM 100 MG PO CAPS: 100.0000 mg | ORAL_CAPSULE | Freq: Two times a day (BID) | ORAL | Status: DC

## 2015-07-22 MED ORDER — EPHEDRINE SULFATE 50 MG/ML IJ SOLN
INTRAMUSCULAR | Status: DC | PRN
  Administered 2015-07-22: 5 mg via INTRAVENOUS

## 2015-07-22 MED ORDER — HYDROCODONE-ACETAMINOPHEN 5-325 MG PO TABS: 1.0000 | ORAL_TABLET | Freq: Four times a day (QID) | ORAL | Status: DC | PRN

## 2015-07-22 MED ORDER — DEXAMETHASONE SODIUM PHOSPHATE 10 MG/ML IJ SOLN
INTRAMUSCULAR | Status: DC | PRN
  Administered 2015-07-22: 10 mg via INTRAVENOUS

## 2015-07-22 MED ORDER — ONDANSETRON HCL 4 MG/2ML IJ SOLN: 4.0000 mg | Freq: Once | INTRAMUSCULAR | Status: DC | PRN

## 2015-07-22 MED ORDER — BUPIVACAINE HCL (PF) 0.5 % IJ SOLN
INTRAMUSCULAR | Status: AC
  Filled 2015-07-22: qty 30

## 2015-07-22 MED ORDER — LIDOCAINE HCL (CARDIAC) 20 MG/ML IV SOLN
INTRAVENOUS | Status: DC | PRN
  Administered 2015-07-22: 80 mg via INTRAVENOUS

## 2015-07-22 MED ORDER — FENTANYL CITRATE (PF) 100 MCG/2ML IJ SOLN
INTRAMUSCULAR | Status: DC | PRN
  Administered 2015-07-22 (×4): 25 ug via INTRAVENOUS

## 2015-07-22 MED ORDER — FENTANYL CITRATE (PF) 100 MCG/2ML IJ SOLN: 25.0000 ug | INTRAMUSCULAR | Status: DC | PRN

## 2015-07-22 MED ORDER — SODIUM CHLORIDE 0.9 % IV SOLN
INTRAVENOUS | Status: DC
  Administered 2015-07-22: 11:00:00 via INTRAVENOUS

## 2015-07-22 SURGICAL SUPPLY — 46 items
BANDAGE CONFORM 2X5YD N/S (GAUZE/BANDAGES/DRESSINGS) ×3 IMPLANT
BLADE SURG 15 STRL LF DISP TIS (BLADE) ×1 IMPLANT
BLADE SURG 15 STRL SS (BLADE) ×2
BNDG COHESIVE 1X5 TAN NS LF (GAUZE/BANDAGES/DRESSINGS) ×3 IMPLANT
CANISTER SUCT 1200ML W/VALVE (MISCELLANEOUS) ×3 IMPLANT
CHLORAPREP W/TINT 26ML (MISCELLANEOUS) ×3 IMPLANT
CLOSURE WOUND 1/2 X4 (GAUZE/BANDAGES/DRESSINGS) ×1
DRAIN PENROSE 1/4X12 LTX (DRAIN) ×3 IMPLANT
DRAPE LAPAROTOMY 100X77 ABD (DRAPES) ×3 IMPLANT
DRAPE LAPAROTOMY 77X122 PED (DRAPES) ×3 IMPLANT
DRESSING TELFA 4X3 1S ST N-ADH (GAUZE/BANDAGES/DRESSINGS) ×3 IMPLANT
DRSG TEGADERM 4X4.75 (GAUZE/BANDAGES/DRESSINGS) ×3 IMPLANT
ELECT CAUTERY NEEDLE TIP 1.0 (MISCELLANEOUS) ×3
ELECT REM PT RETURN 9FT ADLT (ELECTROSURGICAL) ×3
ELECTRODE CAUTERY NEDL TIP 1.0 (MISCELLANEOUS) ×1 IMPLANT
ELECTRODE REM PT RTRN 9FT ADLT (ELECTROSURGICAL) ×1 IMPLANT
GAUZE FLUFF 18X24 1PLY STRL (GAUZE/BANDAGES/DRESSINGS) ×3 IMPLANT
GAUZE PACKING 1/4X5YD (GAUZE/BANDAGES/DRESSINGS) ×3 IMPLANT
GAUZE PETROLATUM 1 X8 (GAUZE/BANDAGES/DRESSINGS) ×3 IMPLANT
GAUZE STRETCH 2X75IN STRL (MISCELLANEOUS) ×3 IMPLANT
GLOVE BIO SURGEON STRL SZ 6.5 (GLOVE) ×2 IMPLANT
GLOVE BIO SURGEONS STRL SZ 6.5 (GLOVE) ×1
GLOVE INDICATOR 7.0 STRL GRN (GLOVE) ×3 IMPLANT
GOWN STRL REUS W/ TWL LRG LVL3 (GOWN DISPOSABLE) ×2 IMPLANT
GOWN STRL REUS W/TWL LRG LVL3 (GOWN DISPOSABLE) ×4
KIT RM TURNOVER STRD PROC AR (KITS) ×3 IMPLANT
LABEL OR SOLS (LABEL) ×3 IMPLANT
NEEDLE HYPO 25X1 1.5 SAFETY (NEEDLE) ×3 IMPLANT
NS IRRIG 500ML POUR BTL (IV SOLUTION) ×3 IMPLANT
PACK BASIN MINOR ARMC (MISCELLANEOUS) ×3 IMPLANT
PREP PVP WINGED SPONGE (MISCELLANEOUS) ×3 IMPLANT
SET INTRO CAPELLA COAXIAL (SET/KITS/TRAYS/PACK) ×3 IMPLANT
SOL PREP PVP 2OZ (MISCELLANEOUS) ×3
SOLUTION PREP PVP 2OZ (MISCELLANEOUS) ×1 IMPLANT
SPONGE KITTNER 5P (MISCELLANEOUS) ×3 IMPLANT
STRIP CLOSURE SKIN 1/2X4 (GAUZE/BANDAGES/DRESSINGS) ×2 IMPLANT
SUPPORT SCROTAL LRG (SOFTGOODS) ×1
SUPPORT SCROTAL LRG NO STRP (SOFTGOODS) ×2 IMPLANT
SUT CHROMIC 3 0 SH 27 (SUTURE) ×9 IMPLANT
SUT CHROMIC 4 0 SH 27 (SUTURE) ×3 IMPLANT
SUT CHROMIC BR 1/2CLE 2-0 54IN (SUTURE) ×3 IMPLANT
SUT PLAIN 3 0 SH 27IN (SUTURE) ×3 IMPLANT
SUT SILK 2 0 SH (SUTURE) ×3 IMPLANT
SUT SURGILON NYL 2 0 T19 M (SUTURE) ×3 IMPLANT
SUT VIC AB 4-0 PS2 18 (SUTURE) ×3 IMPLANT
SYRINGE 10CC LL (SYRINGE) ×3 IMPLANT

## 2015-07-22 NOTE — H&P (View-Only) (Signed)
07/04/2015 9:15 AM   [place] November 02, 1950 [id number]  Referring provider: [name], MD [address] [place], [place] [address]  Chief Complaint  Patient presents with  . Penile Mass    New Patient    HPI: 65 year old male who presents today for further evaluation of a penile mass. He first noticed a white, nodular mass on the tip of his foreskin and approximately 3-4 weeks ago. Since stable in size. He does endorse slight soreness of the area. He has been unable to retract his foreskin fully for many years. He is uncircumcised.  He has no difficulty voiding. He does get up a few times at night but this does not bother him. Stream is adequate. No history of urinary tract infections. No history of gross hematuria.  He does take Plavix and has a history of CAD s/p MI, CHF.  He underwent revision of a cardiac stent a Mom over a year ago. His cardiologist is Dr. Carlena Bjornstad. He has never come off his Plavix for any reason.  PMH: Past Medical History  Diagnosis Date  . MI (myocardial infarction) [facility])     "has not seen cardiologist in several years"  . Diabetes mellitus     sees Dr. Hardin Negus in [place]  . Hyperlipidemia   . Vertigo   . Congestive heart failure ([place])   . GERD (gastroesophageal reflux disease)     Surgical History: Past Surgical History  Procedure Laterality Date  . Coronary stent placement    . External fixation leg Left 04/03/2012    Procedure: EXTERNAL FIXATION LEG;  Surgeon: Wylene Simmer, MD;  Location: [facility];  Service: Orthopedics;  Laterality: Left;  . Irrigation and debridement knee Left 04/03/2012    Procedure: IRRIGATION AND DEBRIDEMENT KNEE;  Surgeon: Wylene Simmer, MD;  Location: [facility];  Service: Orthopedics;  Laterality: Left;  . Orif tibia plateau Left 04/14/2012    Procedure: OPEN REDUCTION INTERNAL FIXATION (ORIF) TIBIAL PLATEAU;  Surgeon: Wylene Simmer, MD;  Location: [facility];  Service: Orthopedics;  Laterality: Left;  . External  fixation removal Left 04/14/2012    Procedure: REMOVAL EXTERNAL FIXATION LEG;  Surgeon: Wylene Simmer, MD;  Location: [facility];  Service: Orthopedics;  Laterality: Left;  . Left heart catheterization with coronary angiogram N/A 04/05/2013    Procedure: LEFT HEART CATHETERIZATION WITH CORONARY ANGIOGRAM;  Surgeon: [name], MD;  Location: [facility] CATH LAB;  Service: Cardiovascular;  Laterality: N/A;  . Percutaneous coronary rotoblator intervention (pci-r) N/A 04/06/2013    Procedure: PERCUTANEOUS CORONARY ROTOBLATOR INTERVENTION (PCI-R);  Surgeon: [name], MD;  Location: [facility] CATH LAB;  Service: Cardiovascular;  Laterality: N/A;  . Esophagogastroduodenoscopy (egd) with propofol N/A 09/12/2014    Procedure: ESOPHAGOGASTRODUODENOSCOPY (EGD) WITH PROPOFOL;  Surgeon: Ronald Lobo, MD;  Location: [facility];  Service: Endoscopy;  Laterality: N/A;  . Cardiac catheterization N/A 10/03/2014    Procedure: Left Heart Cath and Coronary Angiography;  Surgeon: [name], MD;  Location: [place] CV LAB;  Service: Cardiovascular;  Laterality: N/A;    Home Medications:    Medication List       This list is accurate as of: 07/04/15  9:15 AM.  Always use your most recent med list.               atorvastatin 40 MG tablet  Commonly known as:  LIPITOR  Take 1 tablet (40 mg total) by mouth daily.     carvedilol 12.5 MG tablet  Commonly known  as:  COREG  Take 1 tablet (12.5 mg total) by mouth 2 (two) times daily with a meal.     Cinnamon 500 MG capsule  Take 1,000 mg by mouth 2 (two) times daily with a meal.     clopidogrel 75 MG tablet  Commonly known as:  PLAVIX  Take 1 tablet (75 mg total) by mouth daily with breakfast.     ferrous sulfate 325 (65 FE) MG tablet  Commonly known as:  FERROUSUL  Take 1 tablet (325 mg total) by mouth 2 (two) times daily with a meal.     furosemide 40 MG tablet  Commonly known as:  LASIX  Take 1 tablet (40 mg total) by mouth daily.     gabapentin 100  MG capsule  Commonly known as:  NEURONTIN  Take 1 capsule (100 mg total) by mouth 3 (three) times daily.     glipiZIDE 10 MG 24 hr tablet  Commonly known as:  GLUCOTROL XL  Take 1 tablet (10 mg total) by mouth daily with breakfast.     metFORMIN 1000 MG (MOD) 24 hr tablet  Commonly known as:  GLUMETZA  Take 1,000 mg by mouth 2 (two) times daily with a meal.     multivitamin with minerals Tabs tablet  Take 1 tablet by mouth daily.     nitroGLYCERIN 0.4 MG SL tablet  Commonly known as:  NITROSTAT  Place 0.4 mg under the tongue every 5 (five) minutes as needed for chest pain.     pantoprazole 40 MG tablet  Commonly known as:  PROTONIX  Take 1 tablet (40 mg total) by mouth 2 (two) times daily.     quinapril 20 MG tablet  Commonly known as:  ACCUPRIL  Take 1 tablet (20 mg total) by mouth daily.        Allergies: No Known Allergies  Family History: Family History  Problem Relation Age of Onset  . Coronary artery disease Brother   . Lung cancer Brother   . Coronary artery disease Father   . Breast cancer Sister   . Prostate cancer Brother   . Stroke Sister     Social History:  reports that he quit smoking about 20 years ago. He does not have any smokeless tobacco history on file. He reports that he drinks alcohol. He reports that he does not use illicit drugs.  ROS: UROLOGY Frequent Urination?: No Hard to postpone urination?: Yes Burning/pain with urination?: No Get up at night to urinate?: Yes Leakage of urine?: Yes Urine stream starts and stops?: No Trouble starting stream?: No Do you have to strain to urinate?: No Blood in urine?: No Urinary tract infection?: No Sexually transmitted disease?: No Injury to kidneys or bladder?: No Painful intercourse?: No Weak stream?: No Erection problems?: Yes Penile pain?: No  Gastrointestinal Nausea?: No Vomiting?: No Indigestion/heartburn?: No Diarrhea?: No Constipation?: No  Constitutional Fever: No Night  sweats?: No Weight loss?: No Fatigue?: No  Skin Skin rash/lesions?: No Itching?: No  Eyes Blurred vision?: No Double vision?: No  Ears/Nose/Throat Sore throat?: No Sinus problems?: No  Hematologic/Lymphatic Swollen glands?: No Easy bruising?: Yes  Cardiovascular Leg swelling?: No Chest pain?: No  Respiratory Cough?: No Shortness of breath?: No  Endocrine Excessive thirst?: No  Musculoskeletal Back pain?: No Joint pain?: No  Neurological Headaches?: No Dizziness?: No  Psychologic Depression?: No Anxiety?: No  Physical Exam: There were no vitals taken for this visit.  Constitutional:  Alert and oriented, No acute distress. HEENT: [place]  AT, moist mucus membranes.  Trachea midline, no masses. Cardiovascular: No clubbing, cyanosis, or edema. Respiratory: Normal respiratory effort, no increased work of breathing. GI: Abdomen is soft, nontender, nondistended, no abdominal masses.  Obese, protuberant. GU: Uncircumcised phallus with significant phimosis, unable to fully retract foreskin to evaluate glands. There is a whitish, verruca's appearing lesion measuring approximately 1 cm on the distalmost aspect of the foreskin. Upon attempting to retract the foreskin, there is a much larger beefy, verucuss mass measuring approximately 4 cm within the foreskin highly concerning for penile malignancy. The glans is unable to be completely evaluated given the severe phimosis. Testicles descended bilaterally, no masses or tenderness. Skin: No rashes, bruises or suspicious lesions. Lymph: No inguinal adenopathy. Neurologic: Grossly intact, no focal deficits, moving all 4 extremities. Psychiatric: Normal mood and affect.  Laboratory Data: Lab Results  Component Value Date   WBC 9.3 04/19/2015   HGB 14.2 04/19/2015   HCT 40.6 04/19/2015   MCV 94.6 04/19/2015   PLT 221.0 04/19/2015    Lab Results  Component Value Date   CREATININE 1.02 04/19/2015    Lab Results  Component  Value Date   HGBA1C 7.1* 04/19/2015    Assessment & Plan:    1. 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  I have recommended excision via circumcision in the operating room.  We discussed the risks of the procedure in detail including risk of bleeding, infection, damage to surrounding structures, dissatisfaction with cosmesis, penile sensitivity, and possible need for further intervention pending further evaluation/staging.  He will need to be evaluated by his cardiologist and his Plavix will need to be held 7 days prior to the procedure.  2. Phimosis Recommend circumcision as above   Schedule circ/ excision of penile mass   Hollice Espy, MD  [facility] [address] [place],  [id number] [phone]

## 2015-07-22 NOTE — Anesthesia Procedure Notes (Signed)
Procedure Name: LMA Insertion Performed by: Demetrius Charity Pre-anesthesia Checklist: Patient identified, Patient being monitored, Timeout performed, Emergency Drugs available and Suction available Patient Re-evaluated:Patient Re-evaluated prior to inductionOxygen Delivery Method: Circle system utilized Preoxygenation: Pre-oxygenation with 100% oxygen Intubation Type: IV induction Ventilation: Mask ventilation without difficulty LMA: LMA inserted LMA Size: 5.0 Tube type: Oral Number of attempts: 1 Placement Confirmation: positive ETCO2 and breath sounds checked- equal and bilateral Tube secured with: Tape Dental Injury: Teeth and Oropharynx as per pre-operative assessment

## 2015-07-22 NOTE — Transfer of Care (Signed)
Immediate Anesthesia Transfer of Care Note  Patient: Devin Hanna  Procedure(s) Performed: Procedure(s): CIRCUMCISION ADULT (N/A) EXCISION PENILE MASS (N/A)  Patient Location: PACU  Anesthesia Type:General  Level of Consciousness: awake, alert  and oriented  Airway & Oxygen Therapy: Patient Spontanous Breathing and Patient connected to face mask oxygen  Post-op Assessment: Report given to RN and Post -op Vital signs reviewed and stable  Post vital signs: Reviewed and stable  Last Vitals:  Filed Vitals:   07/22/15 1010  BP: 152/71  Pulse: 65  Temp: 36.9 C  Resp: 16    Last Pain: There were no vitals filed for this visit.       Complications: No apparent anesthesia complications

## 2015-07-22 NOTE — Discharge Instructions (Signed)
Circumcision Information and Post Care Instructions  Preparation: Pubic Hair There is no need to completely shave your pubic hair but it is desirable to trim it fairly short. Trim your pubic hair a few days in advance of the operation to allow time for the cut ends to soften again. Hygiene On the morning of the circumcision ensure that you take a good bath or shower and pay particular attention to your genitals. Retract your foreskin as far as you can and clean well under it. Immediately before the time of the procedure empty your bowels and bladder.   After-care: After the procedure your whole penis will be swollen and look very bruised. This is a normal effect of both the injected anaesthetic and the handling it necessarily receives during the operation. These will gradually reduce over the next week or two. Underwear If you normally wear boxers you may find that they give insufficient support immediately post procedure. You may wish to consider some form of briefs which will hold your penis in position to provide support and reduce the friction The Bandage The bandage will normally be wound tightly around the penis. Leave for 48hrs and keep clean and dry.    Promoting Healing Do not apply any antiseptic cream to your penis, nor add any antiseptic to bath water. Though they do help to kill germs, most are corrosive to new skin and actually slow down healing. In the rare cases where an infection develops, see a doctor as soon as possible. Smoking can delay healing and place you at higher risk of infection. You should quit or significantly reduce the amount of cigarettes you smoke prior to and after procedure.  Pain Killers Everyone reacts differently in respect of pain. For most people circumcision will not be truly painful, but a degree of discomfort is to be expected during the first few days. If you choose to take pain killing tablets like Tylenol then follow the instructions precisely.  Do not take more than the recommended maximum dose. Do not take Aspirin or any Aspirin based product since these thin the blood and have an anti-clotting action which can increase bleeding from a wound.  The Stitches Stitches need to remain in place long enough for the cut edges to knit together but not so long as to allow the skin around them to fully heal. In practice this usually means they should remain for between 1 and 2 weeks. Although the doctor will normally use soluble (or self-dissolving) stitches and will dissolve/ fall out on their own.    Time off School or Work There is no absolute need to take time off school or work after circumcision, but you may find it very hard to concentrate on work for the first few days and so may find it useful to take a week off. A week (or even two) off work is very desirable if you do heavy lifting or if your job keeps you seated and unable to move around freely for long periods. You should naturally avoid energetic or contact sports, sexual activity, cycling and swimming until your circumcision has fully healed.  AMBULATORY SURGERY  DISCHARGE INSTRUCTIONS   1) The drugs that you were given will stay in your system until tomorrow so for the next 24 hours you should not:  A) Drive an automobile B) Make any legal decisions C) Drink any alcoholic beverage   2) You may resume regular meals tomorrow.  Today it is better to start with liquids and  gradually work up to solid foods.  You may eat anything you prefer, but it is better to start with liquids, then soup and crackers, and gradually work up to solid foods.   3) Please notify your doctor immediately if you have any unusual bleeding, trouble breathing, redness and pain at the surgery site, drainage, fever, or pain not relieved by medication.    4) Additional Instructions:        Please contact your physician with any problems or Same Day Surgery at [phone], Monday through Friday 6  am to 4 pm, or [place] at [facility] number at [phone].      Infected Circumcision, Adult A circumcision is a surgical procedure to remove the foreskin of the penis. An infected circumcision is an infection in the area where a circumcision was done. CAUSES This condition is most commonly caused by bacteria. Some types of bacteria that normally live on the skin can cause an infection if they spread to the surgical area. RISK FACTORS This condition is more likely to develop in men who do not keep their penis clean after surgery. SYMPTOMS Symptoms of this condition include:  Fever.  Redness.  Swelling.  Pain.  Crusting.  Discharge.  Painful urination. DIAGNOSIS This condition may be diagnosed with a physical exam and tests, such as:  A culture and sensitivity test. In this test, a sample of discharge is taken from your penis and checked under a microscope. This test helps to identify the bacteria that are causing the infection. It also helps to determine what type of antibiotic medicine will work best against it.  A blood test. This test checks for signs of infection. TREATMENT This condition may be treated with:  Cleaning your penis regularly.  Antibiotics. These may be given:  As an ointment.  By mouth.  Through an IV tube. Antibiotics may be given through an IV tube if the infection is serious and it spreads to the blood or damages the tissue of the penis.  Surgery. This may be needed if the infection is severe. HOME CARE INSTRUCTIONS  Take over-the-counter and prescription medicines only as told by your health care provider.  If you were prescribed an antibiotic, take it or apply it as told by your health care provider. Do not stop taking or applying the antibiotic even if you start to feel better.  Wash your hands before you touch your penis.  Gently clean your penis and the infected area as told by your health care provider.  Do not use scented  soap when you bathe.  Wear loose-fitting clothing that will not rub against the infected area.  Avoid sexual arousal and sex as told by your health care provider.  Keep all follow-up visits as told by your health care provider. This is important. SEEK MEDICAL CARE IF:  You have difficulty urinating.  You develop new symptoms.  Your symptoms get worse. SEEK IMMEDIATE MEDICAL CARE IF:  You cannot urinate.  You have new bleeding that does not stop within a few minutes.   This information is not intended to replace advice given to you by your health care provider. Make sure you discuss any questions you have with your health care provider.   Document Released: 01/28/2006 Document Revised: 10/17/2014 Document Reviewed: 04/23/2014 Elsevier Interactive Patient Education [employer].

## 2015-07-22 NOTE — Interval H&P Note (Signed)
History and Physical Interval Note:  07/22/2015 11:32 AM  Devin Hanna  has presented today for surgery, with the diagnosis of PENILE MASS  The various methods of treatment have been discussed with the patient and family. After consideration of risks, benefits and other options for treatment, the patient has consented to  Procedure(s): CIRCUMCISION ADULT (N/A) EXCISION PENILE MASS (N/A) as a surgical intervention .  The patient's history has been reviewed, patient examined, no change in status, stable for surgery.  I have reviewed the patient's chart and labs.  Questions were answered to the patient's satisfaction.    RRR CTAB   Hollice Espy

## 2015-07-22 NOTE — Op Note (Signed)
Date of procedure: 07/22/2015  Preoperative diagnosis:  1. Phimosis 2. Penile mass   Postoperative diagnosis:  1. Phimosis 2. Penile mass   Procedure: 1. Circumcision 2. \Excision of penile mass  Surgeon: Hollice Espy, MD  Anesthesia: General  Complications: None  Intraoperative findings: Severe phimosis. 2 discrete penile lesions on the foreskin measuring 1 x 1 cm and 2 x 3 cm which were verrucous but suspicious for carcinoma.  EBL: Minimal  Specimens: Foreskin with penile masses  Drains: None  Indication: TAISHAWN SMALDONE is a 65 y.o. patient with severe phimosis and penile mass..  After reviewing the management options for treatment, he elected to proceed with the above surgical procedure(s). We have discussed the potential benefits and risks of the procedure, side effects of the proposed treatment, the likelihood of the patient achieving the goals of the procedure, and any potential problems that might occur during the procedure or recuperation. Informed consent has been obtained.  Description of procedure:  The patient was taken to the operating room and general anesthesia was induced.  The patient was placed in the supine position, prepped and draped in the usual sterile fashion, and preoperative antibiotics were administered. A preoperative time-out was performed.   A dorsal penile block was performed using 10 cc of 1% lidocaine. A ring block was also performed using an additional 10 cc.  At this point in time, the phallus was carefully examined and 2 discrete masses which were verrucous in nature but highly suspicious for malignancy on the penile foreskin were appreciated, 1 measuring 1 x 1 cm at the border of the foreskin and an additional 3 x 2 cm mass. Within the foreskin. These appeared to be non-fixed, mobile, and not attached to the glans or shaft of the penis.  A dorsal slit was performed by first clamping the dorsal aspect of the foreskin using a Kelly and then  incising this. This allowed for complete retraction of foreskin. Additional Betadine solution was was applied to the glans. At this point in time, 2 ring incisions were drawn one at the mid shaft well below the location of the masses and one approximately 1 cm below the coronal margin. The marks were then incised using a 15 blade knife. The foreskin along with the masses were then removed in a sleevelike fashion using Bovie electrocautery. Careful hemostasis was then achieved. A frenular adhesion was taken down and the frenulum was reconstructed using a series of interrupted 3-0 chromic sutures.  Sutures were then placed in a circumferential fashion using interrupted 3-0 chromic sutures approximating the penile shaft skin to the skin adjacent to the coronal margin with care taken to ensure the peel shaft skin orientation. The patient was then cleaned and dried and a dressing of Vaseline gauze, coform and coban was applied.    Plan: We will have the patient return in 4 weeks for wound check. I'll call him with his pathology results if any additional treatment is needed.   Hollice Espy, M.D.

## 2015-07-22 NOTE — Anesthesia Postprocedure Evaluation (Signed)
Anesthesia Post Note  Patient: Devin Hanna  Procedure(s) Performed: Procedure(s) (LRB): CIRCUMCISION ADULT (N/A) EXCISION PENILE MASS (N/A)  Patient location during evaluation: PACU Anesthesia Type: General Level of consciousness: awake and alert Pain management: pain level controlled Vital Signs Assessment: post-procedure vital signs reviewed and stable Respiratory status: spontaneous breathing, nonlabored ventilation, respiratory function stable and patient connected to nasal cannula oxygen Cardiovascular status: blood pressure returned to baseline and stable Postop Assessment: no signs of nausea or vomiting Anesthetic complications: no    Last Vitals:  Filed Vitals:   07/22/15 1405 07/22/15 1439  BP:  127/89  Pulse:    Temp: 36.2 C   Resp:  16    Last Pain:  Filed Vitals:   07/22/15 1439  PainSc: Asleep                 Chadwick Reiswig S

## 2015-07-22 NOTE — Anesthesia Preprocedure Evaluation (Addendum)
Anesthesia Evaluation  Patient identified by MRN, date of birth, ID band Patient awake    Reviewed: Allergy & Precautions, NPO status , Patient's Chart, lab work & pertinent test results, reviewed documented beta blocker date and time   Airway Mallampati: III  TM Distance: >3 FB     Dental  (+) Chipped   Pulmonary former smoker,           Cardiovascular hypertension, Pt. on medications and Pt. on home beta blockers + CAD, + Past MI and +CHF       Neuro/Psych  Neuromuscular disease    GI/Hepatic PUD, GERD  Controlled,  Endo/Other  diabetes, Type 2  Renal/GU      Musculoskeletal   Abdominal   Peds  Hematology  (+) anemia ,   Anesthesia Other Findings Obese. On plavix - stent. Takes lasix. Does not take NTG. ECHO was 55% recently.  Reproductive/Obstetrics                            Anesthesia Physical Anesthesia Plan  ASA: II  Anesthesia Plan: General   Post-op Pain Management:    Induction: Intravenous  Airway Management Planned: LMA  Additional Equipment:   Intra-op Plan:   Post-operative Plan:   Informed Consent: I have reviewed the patients History and Physical, chart, labs and discussed the procedure including the risks, benefits and alternatives for the proposed anesthesia with the patient or authorized representative who has indicated his/her understanding and acceptance.     Plan Discussed with: CRNA  Anesthesia Plan Comments:         Anesthesia Quick Evaluation

## 2015-07-24 LAB — SURGICAL PATHOLOGY

## 2015-08-06 ENCOUNTER — Other Ambulatory Visit: Payer: Self-pay | Admitting: *Deleted

## 2015-08-06 DIAGNOSIS — Z9889 Other specified postprocedural states: Secondary | ICD-10-CM

## 2015-08-06 MED ORDER — FUROSEMIDE 40 MG PO TABS: 40.0000 mg | ORAL_TABLET | Freq: Every day | ORAL | Status: DC

## 2015-08-06 NOTE — Telephone Encounter (Signed)
Requested Prescriptions   Signed Prescriptions Disp Refills  . furosemide (LASIX) 40 MG tablet 30 tablet 3    Sig: Take 1 tablet (40 mg total) by mouth daily.    Authorizing Provider: ARIDA, MUHAMMAD A    Ordering User: Suhas Estis C    

## 2015-08-07 ENCOUNTER — Other Ambulatory Visit: Payer: Self-pay | Admitting: *Deleted

## 2015-08-07 MED ORDER — PANTOPRAZOLE SODIUM 40 MG PO TBEC: 40.0000 mg | DELAYED_RELEASE_TABLET | Freq: Two times a day (BID) | ORAL | Status: DC

## 2015-08-21 ENCOUNTER — Encounter: Payer: Self-pay | Admitting: Urology

## 2015-08-21 ENCOUNTER — Ambulatory Visit (INDEPENDENT_AMBULATORY_CARE_PROVIDER_SITE_OTHER): Payer: PPO | Admitting: Urology

## 2015-08-21 VITALS — BP 133/66 | HR 65 | Ht 66.0 in | Wt 242.0 lb

## 2015-08-21 DIAGNOSIS — C609 Malignant neoplasm of penis, unspecified: Secondary | ICD-10-CM

## 2015-08-21 DIAGNOSIS — E119 Type 2 diabetes mellitus without complications: Secondary | ICD-10-CM | POA: Insufficient documentation

## 2015-08-21 DIAGNOSIS — I10 Essential (primary) hypertension: Secondary | ICD-10-CM | POA: Insufficient documentation

## 2015-08-21 DIAGNOSIS — N480 Leukoplakia of penis: Secondary | ICD-10-CM

## 2015-08-21 DIAGNOSIS — D509 Iron deficiency anemia, unspecified: Secondary | ICD-10-CM | POA: Insufficient documentation

## 2015-08-21 NOTE — Progress Notes (Signed)
08/21/2015 1:32 PM   [place] April 17, 1950 [id number]  Referring provider: [name], MD [address] [place], [place] [address]  Chief Complaint  Patient presents with  . Routine Post Op    4wk post    HPI: 65 year old previously uncircumcised male who presented with 2 penile masses highly concerning for carcinoma. He was taken to the operating room on 07/22/2015 for circumcision, excision of this penile tumors. 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  Postoperatively, he is done well. Had very minimal discomfort. He is unable to see the incision site due to a buried phallus and a large pannus.  He is a former smoker and has multiple cardiac issues.   PMH: Past Medical History  Diagnosis Date  . MI (myocardial infarction) [facility])     "has not seen cardiologist in several years"  . Diabetes mellitus     sees Dr. Hardin Negus in [place]  . Hyperlipidemia   . Vertigo   . Congestive heart failure ([place])   . GERD (gastroesophageal reflux disease)   . Peripheral neuropathy [facility])     Surgical History: Past Surgical History  Procedure Laterality Date  . Coronary stent placement    . External fixation leg Left 04/03/2012    Procedure: EXTERNAL FIXATION LEG;  Surgeon: Wylene Simmer, MD;  Location: [place];  Service: Orthopedics;  Laterality: Left;  . Irrigation and debridement knee Left 04/03/2012    Procedure: IRRIGATION AND DEBRIDEMENT KNEE;  Surgeon: Wylene Simmer, MD;  Location: [place];  Service: Orthopedics;  Laterality: Left;  . Orif tibia plateau Left 04/14/2012    Procedure: OPEN REDUCTION INTERNAL FIXATION (ORIF) TIBIAL PLATEAU;  Surgeon: Wylene Simmer, MD;  Location: [place] [place];  Service: Orthopedics;  Laterality: Left;  . External fixation removal Left 04/14/2012    Procedure: REMOVAL EXTERNAL FIXATION LEG;  Surgeon:  Wylene Simmer, MD;  Location: [place];  Service: Orthopedics;  Laterality: Left;  . Left heart catheterization with coronary angiogram N/A 04/05/2013    Procedure: LEFT HEART CATHETERIZATION WITH CORONARY ANGIOGRAM;  Surgeon: [name], MD;  Location: [place] CATH LAB;  Service: Cardiovascular;  Laterality: N/A;  . Percutaneous coronary rotoblator intervention (pci-r) N/A 04/06/2013    Procedure: PERCUTANEOUS CORONARY ROTOBLATOR INTERVENTION (PCI-R);  Surgeon: [name], MD;  Location: [facility] CATH LAB;  Service: Cardiovascular;  Laterality: N/A;  . Esophagogastroduodenoscopy (egd) with propofol N/A 09/12/2014    Procedure: ESOPHAGOGASTRODUODENOSCOPY (EGD) WITH PROPOFOL;  Surgeon: Ronald Lobo, MD;  Location: [place];  Service: Endoscopy;  Laterality: N/A;  . Cardiac catheterization N/A 10/03/2014    Procedure: Left Heart Cath and Coronary Angiography;  Surgeon: [name], MD;  Location: [place] CV LAB;  Service: Cardiovascular;  Laterality: N/A;  . Circumcision N/A 07/22/2015    Procedure: CIRCUMCISION ADULT;  Surgeon: Hollice Espy, MD;  Location: [facility] ORS;  Service: Urology;  Laterality: N/A;  . Mass excision N/A 07/22/2015    Procedure: EXCISION PENILE MASS;  Surgeon: Hollice Espy, MD;  Location: [facility];  Service: Urology;  Laterality: N/A;    Home Medications:    Medication List       This list is accurate as of: 08/21/15  1:32 PM.  Always use your most recent med list.               atorvastatin 40 MG tablet  Commonly known as:  LIPITOR  Take 1 tablet (40 mg total) by mouth daily.     carvedilol 12.5 MG tablet  Commonly known as:  COREG  Take 1 tablet (12.5 mg total) by mouth 2 (two) times daily with a meal.     Cinnamon 500 MG capsule  Take 1,000 mg by mouth 2 (two) times daily with a meal.     clopidogrel 75 MG tablet  Commonly known as:  PLAVIX  Take 1 tablet (75 mg total) by mouth daily with breakfast.     docusate sodium 100 MG capsule  Commonly  known as:  COLACE  Take 1 capsule (100 mg total) by mouth 2 (two) times daily.     ferrous sulfate 325 (65 FE) MG tablet  Commonly known as:  FERROUSUL  Take 1 tablet (325 mg total) by mouth 2 (two) times daily with a meal.     furosemide 40 MG tablet  Commonly known as:  LASIX  Take 1 tablet (40 mg total) by mouth daily.     gabapentin 100 MG capsule  Commonly known as:  NEURONTIN  Take 1 capsule (100 mg total) by mouth 3 (three) times daily.     glipiZIDE 10 MG 24 hr tablet  Commonly known as:  GLUCOTROL XL  Take 1 tablet (10 mg total) by mouth daily with breakfast.     metFORMIN 1000 MG (MOD) 24 hr tablet  Commonly known as:  GLUMETZA  Take 1,000 mg by mouth 2 (two) times daily with a meal.     multivitamin with minerals Tabs tablet  Take 1 tablet by mouth daily.     nitroGLYCERIN 0.4 MG SL tablet  Commonly known as:  NITROSTAT  Place 0.4 mg under the tongue every 5 (five) minutes as needed for chest pain.     pantoprazole 40 MG tablet  Commonly known as:  PROTONIX  Take 1 tablet (40 mg total) by mouth 2 (two) times daily.     quinapril 20 MG tablet  Commonly known as:  ACCUPRIL  Take 1 tablet (20 mg total) by mouth daily.        Allergies: No Known Allergies  Family History: Family History  Problem Relation Age of Onset  . Coronary artery disease Brother   . Lung cancer Brother   . Coronary artery disease Father   . Breast cancer Sister   . Prostate cancer Brother   . Stroke Sister     Social History:  reports that he quit smoking about 20 years ago. He has quit using smokeless tobacco. He reports that he drinks alcohol. He reports that he does not use illicit drugs.  ROS: UROLOGY Frequent Urination?: No Hard to postpone urination?: No Burning/pain with urination?: No Get up at night to urinate?: No Leakage of urine?: No Urine stream starts and stops?: No Trouble starting stream?: No Do you have to strain to urinate?: No Blood in urine?:  No Urinary tract infection?: No Sexually transmitted disease?: No Injury to kidneys or bladder?: No Painful intercourse?: No Weak stream?: No Erection problems?: No Penile pain?: No  Gastrointestinal Nausea?: No Vomiting?: No Indigestion/heartburn?: No Diarrhea?: No Constipation?: No  Constitutional Fever: No Night sweats?: No Weight loss?: No Fatigue?: No  Skin Skin rash/lesions?: No Itching?: No  Eyes Blurred vision?: No Double vision?: No  Ears/Nose/Throat Sore throat?: No Sinus problems?: No  Hematologic/Lymphatic Swollen glands?: No Easy bruising?: No  Cardiovascular Leg swelling?: No Chest pain?: No  Respiratory Cough?: No Shortness of breath?: No  Endocrine Excessive  thirst?: No  Musculoskeletal Back pain?: No Joint pain?: No  Neurological Headaches?: No Dizziness?: No  Psychologic Depression?: No Anxiety?: No  Physical Exam: BP 133/66 mmHg  Pulse 65  Ht 5' 6"  (1.676 m)  Wt 242 lb (109.77 kg)  BMI 39.08 kg/m2  Constitutional:  Alert and oriented, No acute distress. HEENT: [place] AT, moist mucus membranes.  Trachea midline, no masses. Cardiovascular: No clubbing, cyanosis, or edema. Respiratory: Normal respiratory effort, no increased work of breathing. GI: Abdomen is soft, nontender, nondistended, no abdominal masses GU: No CVA tenderness. Buried phallus with well-healed circumcision scar, no residual masses or suspicious lesions. Slightly narrowed urethral meatus consistent with history of lichen sclerosis. Skin: No rashes, bruises or suspicious lesions. Lymph: No cervical or inguinal adenopathy. Neurologic: Grossly intact, no focal deficits, moving all 4 extremities. Psychiatric: Normal mood and affect.  Laboratory Data: Lab Results  Component Value Date   WBC 8.4 07/09/2015   HGB 13.7 07/09/2015   HCT 40.0 07/09/2015   MCV 95.1 07/09/2015   PLT 176 07/09/2015    Lab Results  Component Value Date   CREATININE 0.85  07/09/2015     Lab Results  Component Value Date   HGBA1C 7.1* 04/19/2015    Assessment & Plan:    1. 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 BXO (balanitis xerotica obliterans) Status post circumcision Risk for urethral stricture disease   Return in about 6 months (around 02/21/2016) for penile and lymph node recheck.  Hollice Espy, MD  [facility] [address] [place], [place] [address] [phone]

## 2015-08-23 ENCOUNTER — Other Ambulatory Visit: Payer: Self-pay

## 2015-08-23 MED ORDER — QUINAPRIL HCL 20 MG PO TABS: 20.0000 mg | ORAL_TABLET | Freq: Every day | ORAL | Status: DC

## 2015-08-23 NOTE — Telephone Encounter (Signed)
Refill sent for Quinapril.

## 2015-08-26 ENCOUNTER — Other Ambulatory Visit: Payer: Self-pay | Admitting: *Deleted

## 2015-08-26 MED ORDER — CLOPIDOGREL BISULFATE 75 MG PO TABS: 75.0000 mg | ORAL_TABLET | Freq: Every day | ORAL | Status: DC

## 2015-08-26 NOTE — Telephone Encounter (Signed)
Requested Prescriptions   Signed Prescriptions Disp Refills  . clopidogrel (PLAVIX) 75 MG tablet 30 tablet 3    Sig: Take 1 tablet (75 mg total) by mouth daily with breakfast.    Authorizing Provider: [name] A    Ordering User: [name]

## 2015-10-01 ENCOUNTER — Other Ambulatory Visit (INDEPENDENT_AMBULATORY_CARE_PROVIDER_SITE_OTHER): Payer: PPO

## 2015-10-01 DIAGNOSIS — E119 Type 2 diabetes mellitus without complications: Secondary | ICD-10-CM | POA: Diagnosis not present

## 2015-10-01 LAB — HEMOGLOBIN A1C: Hgb A1c MFr Bld: 7.6 % — ABNORMAL HIGH (ref 4.6–6.5)

## 2015-10-08 ENCOUNTER — Ambulatory Visit (INDEPENDENT_AMBULATORY_CARE_PROVIDER_SITE_OTHER): Payer: PPO | Admitting: Family Medicine

## 2015-10-08 ENCOUNTER — Encounter: Payer: Self-pay | Admitting: Family Medicine

## 2015-10-08 DIAGNOSIS — E1142 Type 2 diabetes mellitus with diabetic polyneuropathy: Secondary | ICD-10-CM

## 2015-10-08 DIAGNOSIS — N489 Disorder of penis, unspecified: Secondary | ICD-10-CM

## 2015-10-08 DIAGNOSIS — I10 Essential (primary) hypertension: Secondary | ICD-10-CM | POA: Diagnosis not present

## 2015-10-08 DIAGNOSIS — N4889 Other specified disorders of penis: Secondary | ICD-10-CM

## 2015-10-08 DIAGNOSIS — K279 Peptic ulcer, site unspecified, unspecified as acute or chronic, without hemorrhage or perforation: Secondary | ICD-10-CM

## 2015-10-08 MED ORDER — EMPAGLIFLOZIN 10 MG PO TABS: 10.0000 mg | ORAL_TABLET | Freq: Every day | ORAL | 1 refills | Status: DC

## 2015-10-08 NOTE — Assessment & Plan Note (Addendum)
At goal. Continue current medications. Suspect mild shortness of breath that he gets with humidity and heat is related to humidity and heat and possibly reactive airway disease. This is of minimal issue to the patient and has been stable for a long period of time. He is breathing well at this time. Nothing to indicate this is related to his heart failure or CAD. He'll continue to monitor and if worsens will let us know. Given return precautions.

## 2015-10-08 NOTE — Assessment & Plan Note (Signed)
His recent A1c slightly worse at 7.6. Discussed goal of less than 7 given his cardiac history. We will add Jardiance for his cardioprotective benefits. Advised of UTI risk and possibility of increased urination given this will cause glucosuria. If he develops these symptoms he'll let us know. He'll continue glipizide and metformin. Discussed checking his blood sugars daily prior to eating. Discussed risk of hypoglycemia with his diabetes medications and to monitor for symptoms. If persistently less than 80 he will let us know. He will additionally continue gabapentin for neuropathy.

## 2015-10-08 NOTE — Assessment & Plan Note (Signed)
Asymptomatic. Continue Protonix.

## 2015-10-08 NOTE — Patient Instructions (Signed)
Nice to see you. I am glad your surgery went well. We are going to add Jardiance to your diabetes regimen. You should try to check your blood sugar once a day in the morning prior to eating. If you develop increasing low blood sugar symptoms please let us know. If your blood sugars consistently less than 80 please let us know. Please continue your current blood pressure medications. If you develop any lightheadedness please let us know.

## 2015-10-08 NOTE — Assessment & Plan Note (Signed)
Ended up being cancerous. Notes he is doing well after removal. Continue to follow with urology.

## 2015-10-08 NOTE — Progress Notes (Signed)
Devin Rumps, MD Phone: [phone]  Devin Hanna is a 65 y.o. male who presents today for follow-up.  DIABETES Disease Monitoring: Blood Sugar ranges-checked this morning was 157, only checks about once a week Polyuria/phagia/dipsia- no      foot exam is up-to-date Medications: Compliance- taking glipizide and metformin Hypoglycemic symptoms- rare, eat something and results Does note occasional burning in his feet during the day. Takes gabapentin 100 mg 3 times daily that is beneficial.  HYPERTENSION  Disease Monitoring  Home BP Monitoring not checking Chest pain- no    Dyspnea- notes occasionally has some heavy breathing when it is hot outside and humid though when he goes inside this resolves, no issues with this when the weather is cool. no exertional issues with this. States this has been stable for several decades. Medications  Compliance-  taking Coreg, Lasix, quinapril.  Edema- mild, takes Lasix and props his feet up and this is beneficial. No PND or orthopnea.  Penile cancer: Patient had surgery recently to remove a mass from his foreskin. Status post circumcision and reports that they got the entire mass. Notes he is healing well. Follows with urology for this.  Peptic ulcer disease: Patient notes no abdominal pain or blood in the stool. Is taking Protonix for this.   PMH: Former smoker   ROS see history of present illness  Objective  Physical Exam Vitals:   10/08/15 0757  BP: 128/72  Pulse: 66  Temp: 98.1 F (36.7 C)    BP Readings from Last 3 Encounters:  10/08/15 128/72  08/21/15 133/66  07/22/15 127/89   Wt Readings from Last 3 Encounters:  10/08/15 246 lb 12.8 oz (111.9 kg)  08/21/15 242 lb (109.8 kg)  07/22/15 244 lb (110.7 kg)    Physical Exam  Constitutional: No distress.  HENT:  Head: Normocephalic and atraumatic.  Mouth/Throat: Oropharynx is clear and moist. No oropharyngeal exudate.  Eyes: Conjunctivae are normal. Pupils are equal,  round, and reactive to light.  Cardiovascular: Normal rate, regular rhythm and normal heart sounds.   Pulmonary/Chest: Effort normal and breath sounds normal.  Musculoskeletal: He exhibits no edema.  Neurological: He is alert. Gait normal.  Skin: Skin is warm and dry. He is not diaphoretic.     Assessment/Plan: Please see individual problem list.  HTN (hypertension) At goal. Continue current medications. Suspect mild shortness of breath that he gets with humidity and heat is related to humidity and heat and possibly reactive airway disease. This is of minimal issue to the patient and has been stable for a long period of time. He is breathing well at this time. Nothing to indicate this is related to his heart failure or CAD. He'll continue to monitor and if worsens will let us know. Given return precautions.  Peptic ulcer disease Asymptomatic. Continue Protonix.  DM type 2 (diabetes mellitus, type 2) His recent A1c slightly worse at 7.6. Discussed goal of less than 7 given his cardiac history. We will add Jardiance for his cardioprotective benefits. Advised of UTI risk and possibility of increased urination given this will cause glucosuria. If he develops these symptoms he'll let us know. He'll continue glipizide and metformin. Discussed checking his blood sugars daily prior to eating. Discussed risk of hypoglycemia with his diabetes medications and to monitor for symptoms. If persistently less than 80 he will let us know. He will additionally continue gabapentin for neuropathy.  Penile mass Ended up being cancerous. Notes he is doing well after removal. Continue to follow  with urology.   No orders of the defined types were placed in this encounter.   Meds ordered this encounter  Medications  . empagliflozin (JARDIANCE) 10 MG TABS tablet    Sig: Take 10 mg by mouth daily.    Dispense:  90 tablet    Refill:  1    Devin Rumps, MD [place]

## 2015-10-08 NOTE — Progress Notes (Signed)
Pre visit review using our clinic review tool, if applicable. No additional management support is needed unless otherwise documented below in the visit note. 

## 2015-10-21 ENCOUNTER — Telehealth: Payer: Self-pay | Admitting: Family Medicine

## 2015-10-21 ENCOUNTER — Telehealth: Payer: Self-pay | Admitting: Cardiovascular Disease

## 2015-10-21 ENCOUNTER — Other Ambulatory Visit: Payer: Self-pay | Admitting: *Deleted

## 2015-10-21 MED ORDER — CARVEDILOL 12.5 MG PO TABS
12.5000 mg | ORAL_TABLET | Freq: Two times a day (BID) | ORAL | 3 refills | Status: DC
Start: 2015-10-21 — End: 2016-06-23

## 2015-10-21 MED ORDER — METFORMIN HCL ER (MOD) 1000 MG PO TB24: 1000.0000 mg | ORAL_TABLET | Freq: Two times a day (BID) | ORAL | 1 refills | Status: DC

## 2015-10-21 MED ORDER — NITROGLYCERIN 0.4 MG SL SUBL: 0.4000 mg | SUBLINGUAL_TABLET | SUBLINGUAL | 3 refills | Status: DC | PRN

## 2015-10-21 NOTE — Telephone Encounter (Signed)
°*  STAT* If patient is at the pharmacy, call can be transferred to refill team.   1. Which medications need to be refilled? (please list name of each medication and dose if known) Coreg 12.5 mg po x 2 daily ; nitrostat 0.4 mg SL x q 5 min prn   2. Which pharmacy/location (including street and city if local pharmacy) is medication to be sent to? [facility] [place]   3. Do they need a 30 day or 90 day supply? [place]

## 2015-10-21 NOTE — Telephone Encounter (Signed)
Pt needs refill on-- metFORMIN (GLUMETZA) 1000 MG (MOD) 24 hr tablet- sent to [facility].. ALL MEDS NEED TO GO TO [facility]  NOW-- pt would like all Rx to be 90 days from now on.. Please advise

## 2015-10-21 NOTE — Telephone Encounter (Signed)
Coreg 12.5 mg #180 R#3  Nitro#25 R#3 sent to [facility].

## 2015-10-21 NOTE — Telephone Encounter (Signed)
rx sent

## 2015-11-21 ENCOUNTER — Telehealth: Payer: Self-pay | Admitting: *Deleted

## 2015-11-21 NOTE — Telephone Encounter (Signed)
LM for patients daughter to return call

## 2015-11-21 NOTE — Telephone Encounter (Signed)
Please advise the patient to hold his Jardiance. He should monitor his blood sugar over the next week and call and let us know what it has been running. If he is persistently less than 80 without the Jardiance he should let us know. If his blood sugars are over 200 he should let us know. Thanks.

## 2015-11-21 NOTE — Telephone Encounter (Signed)
Pt's daughter is aware of Dr.Sonnenbergs statement and confirmed that she understood .  [name] [phone]

## 2015-11-21 NOTE — Telephone Encounter (Signed)
LM for patient daughter to call the office.

## 2015-11-21 NOTE — Telephone Encounter (Signed)
Pt daughter called back returning your call.  Call [place] @ [phone]

## 2015-11-21 NOTE — Telephone Encounter (Signed)
Pt's daughter stated that pt's blood sugar levels are running low, it is believed  to be the new Rx Jardiance. She stated that pt hs not changes in diet. His last reading was 69. Please contact [name] [phone]

## 2015-11-21 NOTE — Telephone Encounter (Signed)
Spoke with patient's daughter and patients sugars has been running low all week. Monday it was in the 70's, Tuesday it was 36, and Wednesday it was in the 80's. She stated that her father has not changed his diet any. During the night he got up to check his BS and it was at 120. During the week he felt off and that is what caused him to check BS. He denied dizziness to daughter.

## 2015-11-29 ENCOUNTER — Telehealth: Payer: Self-pay | Admitting: Family Medicine

## 2015-11-29 NOTE — Telephone Encounter (Signed)
Notified patients daughter of message

## 2015-11-29 NOTE — Telephone Encounter (Signed)
Noted. He should remain off the jardiance. Thanks.

## 2015-11-29 NOTE — Telephone Encounter (Signed)
LM for patients daughter to return call

## 2015-11-29 NOTE — Telephone Encounter (Signed)
Pt daughter Devin Hanna called with an update of her Dad's blood sugar since off the empagliflozin (JARDIANCE) 10 MG TABS tablet.  On Friday and Saturday last week it was in the low 80's  In the mornings it has been ranging from 120-135, and 110-115 at night.  Call [place] @ [phone]

## 2015-11-29 NOTE — Telephone Encounter (Signed)
FYI

## 2016-01-07 ENCOUNTER — Other Ambulatory Visit: Payer: Self-pay | Admitting: *Deleted

## 2016-01-07 MED ORDER — ATORVASTATIN CALCIUM 40 MG PO TABS: 40.0000 mg | ORAL_TABLET | Freq: Every day | ORAL | 3 refills | Status: DC

## 2016-01-09 ENCOUNTER — Encounter: Payer: Self-pay | Admitting: Family Medicine

## 2016-01-09 ENCOUNTER — Ambulatory Visit (INDEPENDENT_AMBULATORY_CARE_PROVIDER_SITE_OTHER): Payer: PPO | Admitting: Family Medicine

## 2016-01-09 VITALS — BP 136/74 | HR 70 | Temp 98.3°F | Resp 16 | Wt 246.2 lb

## 2016-01-09 DIAGNOSIS — E1142 Type 2 diabetes mellitus with diabetic polyneuropathy: Secondary | ICD-10-CM | POA: Diagnosis not present

## 2016-01-09 DIAGNOSIS — I5022 Chronic systolic (congestive) heart failure: Secondary | ICD-10-CM | POA: Diagnosis not present

## 2016-01-09 DIAGNOSIS — Z23 Encounter for immunization: Secondary | ICD-10-CM

## 2016-01-09 DIAGNOSIS — E785 Hyperlipidemia, unspecified: Secondary | ICD-10-CM | POA: Diagnosis not present

## 2016-01-09 DIAGNOSIS — I10 Essential (primary) hypertension: Secondary | ICD-10-CM | POA: Diagnosis not present

## 2016-01-09 DIAGNOSIS — G629 Polyneuropathy, unspecified: Secondary | ICD-10-CM | POA: Insufficient documentation

## 2016-01-09 MED ORDER — GABAPENTIN 100 MG PO CAPS: 200.0000 mg | ORAL_CAPSULE | Freq: Three times a day (TID) | ORAL | 3 refills | Status: DC

## 2016-01-09 NOTE — Assessment & Plan Note (Signed)
Appears to be relatively well controlled at home. We will have him return for fasting lab work and check an A1c at that time. Continue metformin.

## 2016-01-09 NOTE — Assessment & Plan Note (Signed)
Slightly worse recently. Relatively benign exam. We will increase gabapentin to 200 mg 3 times daily. He'll continue to monitor his feet.

## 2016-01-09 NOTE — Progress Notes (Signed)
Pre visit review using our clinic review tool, if applicable. No additional management support is needed unless otherwise documented below in the visit note. 

## 2016-01-09 NOTE — Assessment & Plan Note (Signed)
Has generally been well controlled recently. We will check a CMP today. We'll continue his current medications.

## 2016-01-09 NOTE — Progress Notes (Signed)
Devin Rumps, MD Phone: [phone]  Shaurya Rawdon Mayorquin is a 65 y.o. male who presents today for follow-up.  HYPERTENSION Disease Monitoring: Blood pressure range-not checking Chest pain- no      Dyspnea- no Medications: Compliance- taking coreg, lasix, quinapril   Edema- minimal that improves at night and with propping legs up. Worsens throughout the day.  DIABETES Disease Monitoring: Blood Sugar ranges-130-150 Polyuria/phagia/dipsia- no      Visual problems- no Medications: Compliance- taking metformin Hypoglycemic symptoms- had some lows with jardiance, though none since stopping this medication  HYPERLIPIDEMIA Disease Monitoring: See symptoms for Hypertension Medications: Compliance- taking lipitor Right upper quadrant pain- no  Muscle aches- no  CHF: Patient notes mild lower extremity edema at times. No orthopnea or PND. No shortness of breath. Does take Lasix. This does help him urinate.   PMH: Former smoker  ROS see history of present illness  Objective  Physical Exam Vitals:   01/09/16 0802 01/09/16 0837  BP: 140/78 136/74  Pulse: 70   Resp: 16   Temp: 98.3 F (36.8 C)     BP Readings from Last 3 Encounters:  01/09/16 136/74  10/08/15 128/72  08/21/15 133/66   Wt Readings from Last 3 Encounters:  01/09/16 246 lb 4 oz (111.7 kg)  10/08/15 246 lb 12.8 oz (111.9 kg)  08/21/15 242 lb (109.8 kg)    Physical Exam  Constitutional: No distress.  Cardiovascular: Normal rate, regular rhythm and normal heart sounds.   Trace bilateral lower extremity edema  Pulmonary/Chest: Effort normal and breath sounds normal.  Neurological: He is alert. Gait normal.  Skin: He is not diaphoretic.   Diabetic Foot Exam - Simple   Simple Foot Form Diabetic Foot exam was performed with the following findings:  Yes 01/09/2016  8:27 AM  Visual Inspection No deformities, no ulcerations, no other skin breakdown bilaterally:  Yes Sensation Testing Pulse Check Posterior  Tibialis and Dorsalis pulse intact bilaterally:  Yes Comments Intact light touch, mild decreased monofilament sensation on left dorsum of foot     Assessment/Plan: Please see individual problem list.  HTN (hypertension) Has generally been well controlled recently. We will check a CMP today. We'll continue his current medications.  DM type 2 (diabetes mellitus, type 2) Appears to be relatively well controlled at home. We will have him return for fasting lab work and check an A1c at that time. Continue metformin.  Hyperlipidemia Tolerating Lipitor. Continue Lipitor.  Chronic systolic heart failure Mild lower extremity edema that does improve at night. Otherwise asymptomatic. Continues to take Lasix. He sees cardiology in the next 1-2 weeks.  Neuropathy ([place]) Slightly worse recently. Relatively benign exam. We will increase gabapentin to 200 mg 3 times daily. He'll continue to monitor his feet.   Orders Placed This Encounter  Procedures  . Flu vaccine HIGH DOSE PF  . Comp Met (CMET)    Standing Status:   Future    Standing Expiration Date:   01/08/2017  . HgB A1c    Standing Status:   Future    Standing Expiration Date:   01/08/2017  . Lipid Profile    Standing Status:   Future    Standing Expiration Date:   01/08/2017    Meds ordered this encounter  Medications  . gabapentin (NEURONTIN) 100 MG capsule    Sig: Take 2 capsules (200 mg total) by mouth 3 (three) times daily.    Dispense:  270 capsule    Refill:  3    Devin Rumps, MD  [place]

## 2016-01-09 NOTE — Patient Instructions (Signed)
Nice to see you. We're going to check some lab work fasting sometime next week at your convenience. Please make an appointment for this. Please continue to monitor blood sugars. We will increase your gabapentin to 200 mg 3 times daily. Please see if this helps with the burning sensation in your feet. I will see back in 3 months.

## 2016-01-09 NOTE — Assessment & Plan Note (Signed)
Mild lower extremity edema that does improve at night. Otherwise asymptomatic. Continues to take Lasix. He sees cardiology in the next 1-2 weeks.

## 2016-01-09 NOTE — Assessment & Plan Note (Signed)
Tolerating Lipitor. Continue Lipitor.

## 2016-01-17 ENCOUNTER — Other Ambulatory Visit (INDEPENDENT_AMBULATORY_CARE_PROVIDER_SITE_OTHER): Payer: PPO

## 2016-01-17 ENCOUNTER — Ambulatory Visit (INDEPENDENT_AMBULATORY_CARE_PROVIDER_SITE_OTHER): Payer: PPO | Admitting: Cardiovascular Disease

## 2016-01-17 ENCOUNTER — Encounter: Payer: Self-pay | Admitting: Cardiovascular Disease

## 2016-01-17 VITALS — BP 138/78 | HR 63 | Ht 68.0 in | Wt 245.5 lb

## 2016-01-17 DIAGNOSIS — E7800 Pure hypercholesterolemia, unspecified: Secondary | ICD-10-CM | POA: Diagnosis not present

## 2016-01-17 DIAGNOSIS — I2510 Atherosclerotic heart disease of native coronary artery without angina pectoris: Secondary | ICD-10-CM

## 2016-01-17 DIAGNOSIS — E119 Type 2 diabetes mellitus without complications: Secondary | ICD-10-CM

## 2016-01-17 DIAGNOSIS — E1142 Type 2 diabetes mellitus with diabetic polyneuropathy: Secondary | ICD-10-CM

## 2016-01-17 DIAGNOSIS — E785 Hyperlipidemia, unspecified: Secondary | ICD-10-CM

## 2016-01-17 DIAGNOSIS — I5022 Chronic systolic (congestive) heart failure: Secondary | ICD-10-CM | POA: Diagnosis not present

## 2016-01-17 DIAGNOSIS — I10 Essential (primary) hypertension: Secondary | ICD-10-CM

## 2016-01-17 LAB — COMPREHENSIVE METABOLIC PANEL
ALBUMIN: 3.8 g/dL (ref 3.5–5.2)
ALT: 33 U/L (ref 0–53)
AST: 29 U/L (ref 0–37)
Alkaline Phosphatase: 92 U/L (ref 39–117)
BUN: 9 mg/dL (ref 6–23)
CALCIUM: 9.1 mg/dL (ref 8.4–10.5)
CHLORIDE: 101 meq/L (ref 96–112)
CO2: 30 meq/L (ref 19–32)
Creatinine, Ser: 0.89 mg/dL (ref 0.40–1.50)
GFR: 90.4 mL/min (ref >60.00)
Glucose, Bld: 220 mg/dL — ABNORMAL HIGH (ref 70–99)
Potassium: 4.3 mEq/L (ref 3.5–5.1)
Sodium: 141 mEq/L (ref 135–145)
Total Bilirubin: 1 mg/dL (ref 0.2–1.2)
Total Protein: 6.6 g/dL (ref 6.0–8.3)

## 2016-01-17 LAB — LIPID PANEL
CHOLESTEROL: 98 mg/dL (ref 0–200)
HDL: 32.8 mg/dL — ABNORMAL LOW (ref >39.00)
LDL CALC: 36 mg/dL (ref 0–99)
NonHDL: 65.24
TRIGLYCERIDES: 146 mg/dL (ref 0.0–149.0)
Total CHOL/HDL Ratio: 3
VLDL: 29.2 mg/dL (ref 0.0–40.0)

## 2016-01-17 LAB — MICROALBUMIN / CREATININE URINE RATIO
CREATININE, U: 149.8 mg/dL
MICROALB UR: 1 mg/dL (ref 0.0–1.9)
Microalb Creat Ratio: 0.7 mg/g (ref 0.0–30.0)

## 2016-01-17 LAB — HEMOGLOBIN A1C: HEMOGLOBIN A1C: 8.2 % — AB (ref 4.6–6.5)

## 2016-01-17 NOTE — Progress Notes (Signed)
Cardiology Office Note   Date:  01/17/2016   ID:  Devin Hanna, DOB [birth year], MRN [id number]  PCP:  [name], MD  Cardiologist:   [name], MD   Chief Complaint  Patient presents with  . other    6 month f/u no complaints. Meds reviewed verbally .      History of Present Illness: Devin Hanna is a 65 y.o. male who presents for a followup visit regarding coronary artery disease and chronic systolic heart failure.  He has known CAD with past RCA stents and prior MI, GERD, DM, HTN and HLD.  He presented in February of 2015 with CHF and NSTEMI. cardiac catheterization showed 99% in-stent restenosis in the right coronary artery which was heavily calcified. There was also 60-70% distal LAD stenosis. EF was 30-35% by echo. He did have left bundle branch block.  I performed successful  rotational atherectomy, cutting balloon angioplasty and DES to the proximal RCA .  Echocardiogram in 09/2013 showed EF of 45-50% with mild MR.  He was hospitalized in July, 2016 with shortness of breath and severe hypoxia. He was unresponsive by EMS and responded to BiPAP. His labs showed borderline elevated troponin, borderline BNP and mild lactic acidosis.  He improved with diuresis. During his hospitalization, he had small amount of coffee ground emesis and dark melanotic stool. He did not require transfusion. 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  Nuclear stress test in August 2016 showed evidence of prior inferior and inferoseptal infarct with an ejection fraction of 35-40%. Cardiac catheterization was done due to that which showed patent RCA stent with minimal restenosis with moderate calcified LAD disease. EF was 40%. Left ventricular end-diastolic pressure was 19.  He has been doing well overall with no chest pain. He reports stable exertional dyspnea which happens with over exertion and typically more prominent in the hot weather. He also complains of  bilateral leg edema which is typically worse on the left side and does improve by the end of the day. He has been taking his medications regularly.    Past Medical History:  Diagnosis Date  . Congestive heart failure ([place])   . Diabetes mellitus    sees Dr. Hardin Negus in [place]  . GERD (gastroesophageal reflux disease)   . Hyperlipidemia   . MI (myocardial infarction)    "has not seen cardiologist in several years"  . Peripheral neuropathy ([place])   . Vertigo     Past Surgical History:  Procedure Laterality Date  . CARDIAC CATHETERIZATION N/A 10/03/2014   Procedure: Left Heart Cath and Coronary Angiography;  Surgeon: [name], MD;  Location: [place] [facility];  Service: Cardiovascular;  Laterality: N/A;  . CIRCUMCISION N/A 07/22/2015   Procedure: CIRCUMCISION ADULT;  Surgeon: Hollice Espy, MD;  Location: [facility];  Service: Urology;  Laterality: N/A;  . CORONARY STENT PLACEMENT    . ESOPHAGOGASTRODUODENOSCOPY (EGD) WITH PROPOFOL N/A 09/12/2014   Procedure: ESOPHAGOGASTRODUODENOSCOPY (EGD) WITH PROPOFOL;  Surgeon: Ronald Lobo, MD;  Location: [facility] ENDOSCOPY;  Service: Endoscopy;  Laterality: N/A;  . EXTERNAL FIXATION LEG Left 04/03/2012   Procedure: EXTERNAL FIXATION LEG;  Surgeon: Wylene Simmer, MD;  Location: [place];  Service: Orthopedics;  Laterality: Left;  . EXTERNAL FIXATION REMOVAL Left 04/14/2012   Procedure: REMOVAL EXTERNAL FIXATION LEG;  Surgeon: Wylene Simmer, MD;  Location: [place];  Service: Orthopedics;  Laterality: Left;  . IRRIGATION AND DEBRIDEMENT KNEE Left 04/03/2012   Procedure: IRRIGATION AND DEBRIDEMENT  KNEE;  Surgeon: Wylene Simmer, MD;  Location: [place];  Service: Orthopedics;  Laterality: Left;  . LEFT HEART CATHETERIZATION WITH CORONARY ANGIOGRAM N/A 04/05/2013   Procedure: LEFT HEART CATHETERIZATION WITH CORONARY ANGIOGRAM;  Surgeon: [name], MD;  Location: [place] CATH LAB;  Service: Cardiovascular;  Laterality: N/A;  . MASS EXCISION N/A 07/22/2015    Procedure: EXCISION PENILE MASS;  Surgeon: Hollice Espy, MD;  Location: [facility] OR[facility];  Service: Urology;  Laterality: N/A;  . ORIF TIBIA PLATEAU Left 04/14/2012   Procedure: OPEN REDUCTION INTERNAL FIXATION (ORIF) TIBIAL PLATEAU;  Surgeon: Wylene Simmer, MD;  Location: [place];  Service: Orthopedics;  Laterality: Left;  . PERCUTANEOUS CORONARY ROTOBLATOR INTERVENTION (PCI-R) N/A 04/06/2013   Procedure: PERCUTANEOUS CORONARY ROTOBLATOR INTERVENTION (PCI-R);  Surgeon: [name], MD;  Location: [facility] CATH LAB;  Service: Cardiovascular;  Laterality: N/A;     Current Outpatient Prescriptions  Medication Sig Dispense Refill  . atorvastatin (LIPITOR) 40 MG tablet Take 1 tablet (40 mg total) by mouth daily. 90 tablet 3  . carvedilol (COREG) 12.5 MG tablet Take 1 tablet (12.5 mg total) by mouth 2 (two) times daily with a meal. 180 tablet 3  . Cinnamon 500 MG capsule Take 1,000 mg by mouth 2 (two) times daily with a meal.    . clopidogrel (PLAVIX) 75 MG tablet Take 1 tablet (75 mg total) by mouth daily with breakfast. 30 tablet 3  . docusate sodium (COLACE) 100 MG capsule Take 1 capsule (100 mg total) by mouth 2 (two) times daily. 60 capsule 0  . empagliflozin (JARDIANCE) 10 MG TABS tablet Take 10 mg by mouth daily. 90 tablet 1  . ferrous sulfate (FERROUSUL) 325 (65 FE) MG tablet Take 1 tablet (325 mg total) by mouth 2 (two) times daily with a meal. 60 tablet 5  . furosemide (LASIX) 40 MG tablet Take 1 tablet (40 mg total) by mouth daily. 30 tablet 3  . gabapentin (NEURONTIN) 100 MG capsule Take 2 capsules (200 mg total) by mouth 3 (three) times daily. 270 capsule 3  . glipiZIDE (GLUCOTROL XL) 10 MG 24 hr tablet Take 1 tablet (10 mg total) by mouth daily with breakfast. 90 tablet 3  . metFORMIN (GLUMETZA) 1000 MG (MOD) 24 hr tablet Take 1 tablet (1,000 mg total) by mouth 2 (two) times daily with a meal. 90 tablet 1  . Multiple Vitamin (MULTIVITAMIN WITH MINERALS) TABS Take 1 tablet by mouth daily.    .  nitroGLYCERIN (NITROSTAT) 0.4 MG SL tablet Place 1 tablet (0.4 mg total) under the tongue every 5 (five) minutes as needed for chest pain. 25 tablet 3  . pantoprazole (PROTONIX) 40 MG tablet Take 1 tablet (40 mg total) by mouth 2 (two) times daily. 60 tablet 3  . quinapril (ACCUPRIL) 20 MG tablet Take 1 tablet (20 mg total) by mouth daily. 30 tablet 3   No current facility-administered medications for this visit.     Allergies:   Patient has no known allergies.    Social History:  The patient  reports that he quit smoking about 20 years ago. He has quit using smokeless tobacco. He reports that he does not drink alcohol or use drugs.   Family History:  The patient's family history includes Breast cancer in his sister; Coronary artery disease in his brother and father; Lung cancer in his brother; Prostate cancer in his brother; Stroke in his sister.    ROS:  Please see the history of present illness.   Otherwise, review  of systems are positive for none.   All other systems are reviewed and negative.    PHYSICAL EXAM: VS:  BP 138/78 (BP Location: Left Arm, Patient Position: Sitting, Cuff Size: Normal)   Pulse 63   Ht 5' 8"  (1.727 m)   Wt 245 lb 8 oz (111.4 kg)   BMI 37.33 kg/m  , BMI Body mass index is 37.33 kg/m. GEN: Well nourished, well developed, in no acute distress  HEENT: normal  Neck: no JVD, carotid bruits, or masses Cardiac: RRR; no  rubs, or gallops. . 2/6 systolic ejection murmur in the aortic area which is early peaking . Mild leg edema Respiratory:  clear to auscultation bilaterally, normal work of breathing GI: soft, nontender, nondistended, + BS MS: no deformity or atrophy  Skin: warm and dry, no rash Neuro:  Strength and sensation are intact Psych: euthymic mood, full affect   EKG:  EKG is ordered today. The ekg ordered today demonstrates normal sinus rhythm with left bundle branch block.   Recent Labs: 04/22/2015: ALT 35 07/09/2015: BUN 12; Creatinine, Ser  0.85; Hemoglobin 13.7; Platelets 176; Potassium 3.8; Sodium 134    Lipid Panel    Component Value Date/Time   CHOL 103 06/07/2014 0824   TRIG 121 06/07/2014 0824   HDL 38 (L) 06/07/2014 0824   CHOLHDL 2.7 06/07/2014 0824   LDLCALC 41 06/07/2014 0824      Wt Readings from Last 3 Encounters:  01/17/16 245 lb 8 oz (111.4 kg)  01/09/16 246 lb 4 oz (111.7 kg)  10/08/15 246 lb 12.8 oz (111.9 kg)        ASSESSMENT AND PLAN:  1.  Coronary artery disease involving native coronary arteries without angina:: He is doing reasonably well with no anginal symptoms. Continue medical therapy. He is to stay on Plavix without aspirin due to prior gastric ulcers.  2. Chronic systolic heart failure: He appears to be euvolemic on current dose of furosemide.  He is scheduled to get basic metabolic profile today.  3. Essential hypertension: Blood pressure is now well controlled  on current medications.  4. Hyperlipidemia: Most recent lipid profile in 2016 showed an LDL of 41. Continue treatment with atorvastatin. He is going for repeat labs today.    Disposition:   FU with me in 6 months  Signed,  [name], MD  01/17/2016 8:16 AM    [place]

## 2016-01-17 NOTE — Patient Instructions (Signed)
Medication Instructions: Continue same medications.   Labwork: None.   Procedures/Testing: None.   Follow-Up: 6 months with Dr. Arida.   Any Additional Special Instructions Will Be Listed Below (If Applicable).     If you need a refill on your cardiac medications before your next appointment, please call your pharmacy.   

## 2016-01-21 NOTE — Addendum Note (Signed)
Addended by: [name] on: 01/21/2016 09:46 AM   Modules accepted: Orders

## 2016-01-23 ENCOUNTER — Other Ambulatory Visit: Payer: Self-pay | Admitting: Family Medicine

## 2016-01-23 MED ORDER — SITAGLIPTIN PHOSPHATE 100 MG PO TABS: 50.0000 mg | ORAL_TABLET | Freq: Every day | ORAL | 1 refills | Status: DC

## 2016-01-31 ENCOUNTER — Other Ambulatory Visit: Payer: Self-pay

## 2016-01-31 DIAGNOSIS — Z9889 Other specified postprocedural states: Secondary | ICD-10-CM

## 2016-01-31 MED ORDER — QUINAPRIL HCL 20 MG PO TABS: 20.0000 mg | ORAL_TABLET | Freq: Every day | ORAL | 3 refills | Status: DC

## 2016-01-31 MED ORDER — FUROSEMIDE 40 MG PO TABS: 40.0000 mg | ORAL_TABLET | Freq: Every day | ORAL | 3 refills | Status: DC

## 2016-02-21 ENCOUNTER — Ambulatory Visit: Payer: PPO | Admitting: Urology

## 2016-02-24 ENCOUNTER — Telehealth: Payer: Self-pay | Admitting: Family Medicine

## 2016-02-24 MED ORDER — GLUCOSE BLOOD VI STRP: ORAL_STRIP | 12 refills | Status: DC

## 2016-02-24 NOTE — Telephone Encounter (Signed)
Pt daughter called and is requesting a refill on True test test strips. Please advise, thank you!  [place], [place]  Call daughter Devin Hanna @ [phone]

## 2016-02-24 NOTE — Telephone Encounter (Signed)
I do not see this in his chart, ok to send in?

## 2016-02-24 NOTE — Telephone Encounter (Signed)
Sent to pharmacy 

## 2016-03-02 ENCOUNTER — Other Ambulatory Visit: Payer: Self-pay | Admitting: Cardiovascular Disease

## 2016-03-02 MED ORDER — PANTOPRAZOLE SODIUM 40 MG PO TBEC: 40.0000 mg | DELAYED_RELEASE_TABLET | Freq: Two times a day (BID) | ORAL | 3 refills | Status: DC

## 2016-03-05 ENCOUNTER — Telehealth: Payer: Self-pay | Admitting: Family Medicine

## 2016-03-05 MED ORDER — METFORMIN HCL ER 500 MG PO TB24: 1000.0000 mg | ORAL_TABLET | Freq: Two times a day (BID) | ORAL | 1 refills | Status: DC

## 2016-03-05 NOTE — Telephone Encounter (Signed)
New prescription sent to pharmacy 

## 2016-03-05 NOTE — Telephone Encounter (Signed)
Please advise 

## 2016-03-05 NOTE — Telephone Encounter (Signed)
Shelton Silvas from [facility] called and wanted to know if we could change pt's metFORMIN (GLUMETZA) 1000 MG (MOD) 24 hr tablet to Metformin ER 500 mg 2x day, due to price. The price for the 1000 mg is $85.00 and the price for the 500 mg is $5.00. Please advise, thank you!Call @ [phone]

## 2016-03-27 ENCOUNTER — Other Ambulatory Visit: Payer: Self-pay | Admitting: *Deleted

## 2016-03-27 ENCOUNTER — Other Ambulatory Visit: Payer: Self-pay | Admitting: Family Medicine

## 2016-03-27 MED ORDER — CLOPIDOGREL BISULFATE 75 MG PO TABS: 75.0000 mg | ORAL_TABLET | Freq: Every day | ORAL | 3 refills | Status: DC

## 2016-04-10 ENCOUNTER — Encounter: Payer: Self-pay | Admitting: Family Medicine

## 2016-04-10 ENCOUNTER — Ambulatory Visit (INDEPENDENT_AMBULATORY_CARE_PROVIDER_SITE_OTHER): Payer: PPO | Admitting: Family Medicine

## 2016-04-10 VITALS — BP 130/78 | HR 69 | Temp 98.2°F | Wt 254.4 lb

## 2016-04-10 DIAGNOSIS — I10 Essential (primary) hypertension: Secondary | ICD-10-CM | POA: Diagnosis not present

## 2016-04-10 DIAGNOSIS — E1142 Type 2 diabetes mellitus with diabetic polyneuropathy: Secondary | ICD-10-CM | POA: Diagnosis not present

## 2016-04-10 DIAGNOSIS — K279 Peptic ulcer, site unspecified, unspecified as acute or chronic, without hemorrhage or perforation: Secondary | ICD-10-CM | POA: Diagnosis not present

## 2016-04-10 NOTE — Assessment & Plan Note (Signed)
At goal. Suspect his edema is related to a combination of his prior injury and venous insufficiency. Less likely contributing is his CHF. He is asymptomatic other than the edema. Advised continuing to monitor and to monitor his breathing. He'll continue his current blood pressure medications.

## 2016-04-10 NOTE — Progress Notes (Signed)
  [name], MD Phone: [phone]  Devin Hanna is a 66 y.o. male who presents today for f/u.  HYPERTENSION  Disease Monitoring  Home BP Monitoring not checking Chest pain- no    Dyspnea- no Medications  Compliance-  Taking coreg, lasix, quinapril.  Edema- chronic and stable, no orthopnea or PND. Has been chronic and stable since he had an injury to his legs many years ago.  DIABETES Disease Monitoring: Blood Sugar ranges-115-160 Polyuria/phagia/dipsia- no       Medications: Compliance- taking glipizide, metformin, januvia Hypoglycemic symptoms- no Notes occasional tingling in his feet. Takes gabapentin 2-3 times daily for this.  Peptic ulcer disease: Has not had any abdominal discomfort. No reflux symptoms or no blood in the stool. He continues to take Protonix.   PMH: Former smoker   ROS see history of present illness  Objective  Physical Exam Vitals:   04/10/16 0758  BP: 130/78  Pulse: 69  Temp: 98.2 F (36.8 C)    BP Readings from Last 3 Encounters:  04/10/16 130/78  01/17/16 138/78  01/09/16 136/74   Wt Readings from Last 3 Encounters:  04/10/16 254 lb 6.4 oz (115.4 kg)  01/17/16 245 lb 8 oz (111.4 kg)  01/09/16 246 lb 4 oz (111.7 kg)    Physical Exam  Constitutional: No distress.  HENT:  Head: Normocephalic and atraumatic.  Cardiovascular: Normal rate, regular rhythm and normal heart sounds.   Pulmonary/Chest: Effort normal and breath sounds normal.  Abdominal: Soft. Bowel sounds are normal. He exhibits no distension. There is no tenderness. There is no rebound and no guarding.  Musculoskeletal: He exhibits edema (trace lower extremity edema).  Neurological: He is alert. Gait normal.  Skin: Skin is warm and dry. He is not diaphoretic.   Diabetic Foot Exam - Simple   Simple Foot Form Diabetic Foot exam was performed with the following findings:  Yes 04/10/2016  8:25 AM  Visual Inspection No deformities, no ulcerations, no other skin  breakdown bilaterally:  Yes Sensation Testing Intact to touch and monofilament testing bilaterally:  Yes Pulse Check Posterior Tibialis and Dorsalis pulse intact bilaterally:  Yes Comments      Assessment/Plan: Please see individual problem list.  HTN (hypertension) At goal. Suspect his edema is related to a combination of his prior injury and venous insufficiency. Less likely contributing is his CHF. He is asymptomatic other than the edema. Advised continuing to monitor and to monitor his breathing. He'll continue his current blood pressure medications.  Peptic ulcer disease Asymptomatic. Continue Protonix. Monitor for recurrence of symptoms.  DM type 2 (diabetes mellitus, type 2) Seems to be better controlled. He'll return in about a week for an A1c. Continue medications. Continue to monitor his feet. He'll continue gabapentin for neuropathy.   Orders Placed This Encounter  Procedures  . POCT HgB A1C    Standing Status:   Future    Standing Expiration Date:   06/10/2016    [name], MD [place]

## 2016-04-10 NOTE — Progress Notes (Signed)
Pre visit review using our clinic review tool, if applicable. No additional management support is needed unless otherwise documented below in the visit note. 

## 2016-04-10 NOTE — Assessment & Plan Note (Signed)
Asymptomatic. Continue Protonix. Monitor for recurrence of symptoms.

## 2016-04-10 NOTE — Patient Instructions (Signed)
Nice to see you. We will have you return in about a week to check your A1c. Please continue your current medications. Please monitor your feet on a daily basis.

## 2016-04-10 NOTE — Assessment & Plan Note (Signed)
Seems to be better controlled. He'll return in about a week for an A1c. Continue medications. Continue to monitor his feet. He'll continue gabapentin for neuropathy.

## 2016-04-20 ENCOUNTER — Telehealth: Payer: Self-pay | Admitting: Family Medicine

## 2016-04-20 NOTE — Telephone Encounter (Signed)
Left pt message asking to call Ebony Hail back directly at [phone] to schedule AWV. Thanks!

## 2016-04-21 ENCOUNTER — Other Ambulatory Visit: Payer: Self-pay | Admitting: Family Medicine

## 2016-04-24 ENCOUNTER — Other Ambulatory Visit: Payer: Self-pay

## 2016-04-24 DIAGNOSIS — Z9889 Other specified postprocedural states: Secondary | ICD-10-CM

## 2016-04-24 MED ORDER — FUROSEMIDE 40 MG PO TABS: 40.0000 mg | ORAL_TABLET | Freq: Every day | ORAL | 3 refills | Status: DC

## 2016-04-24 NOTE — Telephone Encounter (Signed)
Requested Prescriptions   Signed Prescriptions Disp Refills  . furosemide (LASIX) 40 MG tablet 30 tablet 3    Sig: Take 1 tablet (40 mg total) by mouth daily.    Authorizing Provider: [name] A    Ordering User: Janan Ridge

## 2016-05-01 ENCOUNTER — Other Ambulatory Visit (INDEPENDENT_AMBULATORY_CARE_PROVIDER_SITE_OTHER): Payer: PPO

## 2016-05-01 DIAGNOSIS — E1142 Type 2 diabetes mellitus with diabetic polyneuropathy: Secondary | ICD-10-CM | POA: Diagnosis not present

## 2016-05-01 LAB — POCT GLYCOSYLATED HEMOGLOBIN (HGB A1C): HEMOGLOBIN A1C: 7.2

## 2016-05-25 ENCOUNTER — Other Ambulatory Visit: Payer: Self-pay

## 2016-05-25 ENCOUNTER — Telehealth: Payer: Self-pay

## 2016-05-25 MED ORDER — PANTOPRAZOLE SODIUM 40 MG PO TBEC: 40.0000 mg | DELAYED_RELEASE_TABLET | Freq: Two times a day (BID) | ORAL | 3 refills | Status: DC

## 2016-05-25 MED ORDER — QUINAPRIL HCL 20 MG PO TABS: 20.0000 mg | ORAL_TABLET | Freq: Every day | ORAL | 3 refills | Status: DC

## 2016-05-25 NOTE — Telephone Encounter (Signed)
Refill sent for quinapril 20 mg

## 2016-05-25 NOTE — Telephone Encounter (Signed)
Refill sent for pantoprazole 40 mg

## 2016-06-05 NOTE — Telephone Encounter (Signed)
Scheduled 07/15/16

## 2016-06-05 NOTE — Telephone Encounter (Signed)
Left pt message asking to call Ebony Hail back directly at [phone] to schedule AWV. Thanks!

## 2016-06-09 DIAGNOSIS — J9601 Acute respiratory failure with hypoxia: Secondary | ICD-10-CM

## 2016-06-09 HISTORY — DX: Acute respiratory failure with hypoxia: J96.01

## 2016-06-19 ENCOUNTER — Inpatient Hospital Stay (HOSPITAL_COMMUNITY)
Admission: EM | Admit: 2016-06-19 | Discharge: 2016-06-23 | DRG: 291 | Disposition: A | Discharge status: Home / Self Care | Payer: PPO | Attending: Family Medicine | Admitting: Family Medicine

## 2016-06-19 ENCOUNTER — Emergency Department (HOSPITAL_COMMUNITY): Payer: PPO

## 2016-06-19 ENCOUNTER — Encounter (HOSPITAL_COMMUNITY): Payer: Self-pay

## 2016-06-19 DIAGNOSIS — I10 Essential (primary) hypertension: Secondary | ICD-10-CM | POA: Diagnosis present

## 2016-06-19 DIAGNOSIS — E1140 Type 2 diabetes mellitus with diabetic neuropathy, unspecified: Secondary | ICD-10-CM | POA: Diagnosis not present

## 2016-06-19 DIAGNOSIS — Z79899 Other long term (current) drug therapy: Secondary | ICD-10-CM

## 2016-06-19 DIAGNOSIS — E1142 Type 2 diabetes mellitus with diabetic polyneuropathy: Secondary | ICD-10-CM | POA: Diagnosis not present

## 2016-06-19 DIAGNOSIS — E119 Type 2 diabetes mellitus without complications: Secondary | ICD-10-CM

## 2016-06-19 DIAGNOSIS — Z7902 Long term (current) use of antithrombotics/antiplatelets: Secondary | ICD-10-CM | POA: Diagnosis not present

## 2016-06-19 DIAGNOSIS — Z955 Presence of coronary angioplasty implant and graft: Secondary | ICD-10-CM

## 2016-06-19 DIAGNOSIS — E7800 Pure hypercholesterolemia, unspecified: Secondary | ICD-10-CM | POA: Diagnosis not present

## 2016-06-19 DIAGNOSIS — E872 Acidosis: Secondary | ICD-10-CM | POA: Diagnosis present

## 2016-06-19 DIAGNOSIS — N390 Urinary tract infection, site not specified: Secondary | ICD-10-CM | POA: Diagnosis present

## 2016-06-19 DIAGNOSIS — K219 Gastro-esophageal reflux disease without esophagitis: Secondary | ICD-10-CM | POA: Diagnosis not present

## 2016-06-19 DIAGNOSIS — I2510 Atherosclerotic heart disease of native coronary artery without angina pectoris: Secondary | ICD-10-CM | POA: Diagnosis not present

## 2016-06-19 DIAGNOSIS — R778 Other specified abnormalities of plasma proteins: Secondary | ICD-10-CM | POA: Insufficient documentation

## 2016-06-19 DIAGNOSIS — I340 Nonrheumatic mitral (valve) insufficiency: Secondary | ICD-10-CM | POA: Diagnosis not present

## 2016-06-19 DIAGNOSIS — J189 Pneumonia, unspecified organism: Secondary | ICD-10-CM | POA: Diagnosis not present

## 2016-06-19 DIAGNOSIS — J9601 Acute respiratory failure with hypoxia: Secondary | ICD-10-CM

## 2016-06-19 DIAGNOSIS — K279 Peptic ulcer, site unspecified, unspecified as acute or chronic, without hemorrhage or perforation: Secondary | ICD-10-CM | POA: Diagnosis present

## 2016-06-19 DIAGNOSIS — Z7984 Long term (current) use of oral hypoglycemic drugs: Secondary | ICD-10-CM

## 2016-06-19 DIAGNOSIS — R7989 Other specified abnormal findings of blood chemistry: Secondary | ICD-10-CM | POA: Diagnosis not present

## 2016-06-19 DIAGNOSIS — Z87891 Personal history of nicotine dependence: Secondary | ICD-10-CM | POA: Diagnosis not present

## 2016-06-19 DIAGNOSIS — I5023 Acute on chronic systolic (congestive) heart failure: Secondary | ICD-10-CM | POA: Diagnosis not present

## 2016-06-19 DIAGNOSIS — R0602 Shortness of breath: Secondary | ICD-10-CM | POA: Diagnosis not present

## 2016-06-19 DIAGNOSIS — I248 Other forms of acute ischemic heart disease: Secondary | ICD-10-CM | POA: Diagnosis present

## 2016-06-19 DIAGNOSIS — E785 Hyperlipidemia, unspecified: Secondary | ICD-10-CM | POA: Diagnosis not present

## 2016-06-19 DIAGNOSIS — I5022 Chronic systolic (congestive) heart failure: Secondary | ICD-10-CM | POA: Diagnosis present

## 2016-06-19 DIAGNOSIS — I509 Heart failure, unspecified: Secondary | ICD-10-CM | POA: Diagnosis not present

## 2016-06-19 DIAGNOSIS — R0600 Dyspnea, unspecified: Secondary | ICD-10-CM | POA: Diagnosis not present

## 2016-06-19 DIAGNOSIS — A419 Sepsis, unspecified organism: Secondary | ICD-10-CM | POA: Diagnosis present

## 2016-06-19 DIAGNOSIS — E1165 Type 2 diabetes mellitus with hyperglycemia: Secondary | ICD-10-CM | POA: Diagnosis not present

## 2016-06-19 DIAGNOSIS — Z6835 Body mass index (BMI) 35.0-35.9, adult: Secondary | ICD-10-CM

## 2016-06-19 DIAGNOSIS — R069 Unspecified abnormalities of breathing: Secondary | ICD-10-CM | POA: Diagnosis not present

## 2016-06-19 DIAGNOSIS — I252 Old myocardial infarction: Secondary | ICD-10-CM | POA: Diagnosis not present

## 2016-06-19 DIAGNOSIS — I110 Hypertensive heart disease with heart failure: Principal | ICD-10-CM | POA: Diagnosis present

## 2016-06-19 HISTORY — DX: Acute respiratory failure with hypoxia: J96.01

## 2016-06-19 HISTORY — DX: Left bundle-branch block, unspecified: I44.7

## 2016-06-19 LAB — I-STAT CHEM 8, ED
BUN: 9 mg/dL (ref 6–20)
CALCIUM ION: 1.04 mmol/L — AB (ref 1.15–1.40)
CREATININE: 1.1 mg/dL (ref 0.61–1.24)
Chloride: 103 mmol/L (ref 101–111)
Glucose, Bld: 358 mg/dL — ABNORMAL HIGH (ref 65–99)
HEMATOCRIT: 49 % (ref 39.0–52.0)
HEMOGLOBIN: 16.7 g/dL (ref 13.0–17.0)
Potassium: 4.6 mmol/L (ref 3.5–5.1)
Sodium: 138 mmol/L (ref 135–145)
TCO2: 23 mmol/L (ref 0–100)

## 2016-06-19 LAB — CBC WITH DIFFERENTIAL/PLATELET
BASOS PCT: 0 %
Basophils Absolute: 0 10*3/uL (ref 0.0–0.1)
EOS ABS: 0.2 10*3/uL (ref 0.0–0.7)
EOS PCT: 1 %
HCT: 48.6 % (ref 39.0–52.0)
Hemoglobin: 16.3 g/dL (ref 13.0–17.0)
Lymphocytes Relative: 28 %
Lymphs Abs: 3.5 10*3/uL (ref 0.7–4.0)
MCH: 32.6 pg (ref 26.0–34.0)
MCHC: 33.5 g/dL (ref 30.0–36.0)
MCV: 97.2 fL (ref 78.0–100.0)
MONO ABS: 0.7 10*3/uL (ref 0.1–1.0)
Monocytes Relative: 6 %
NEUTROS ABS: 8 10*3/uL — AB (ref 1.7–7.7)
Neutrophils Relative %: 65 %
PLATELETS: 261 10*3/uL (ref 150–400)
RBC: 5 MIL/uL (ref 4.22–5.81)
RDW: 14.1 % (ref 11.5–15.5)
WBC: 12.3 10*3/uL — ABNORMAL HIGH (ref 4.0–10.5)

## 2016-06-19 LAB — I-STAT CG4 LACTIC ACID, ED: LACTIC ACID, VENOUS: 3.93 mmol/L — AB (ref 0.5–1.9)

## 2016-06-19 LAB — TROPONIN I
TROPONIN I: 0.13 ng/mL — AB (ref <0.03)
Troponin I: 0.06 ng/mL (ref <0.03)
Troponin I: 0.1 ng/mL (ref <0.03)

## 2016-06-19 LAB — LACTIC ACID, PLASMA: LACTIC ACID, VENOUS: 4 mmol/L — AB (ref 0.5–1.9)

## 2016-06-19 LAB — URINALYSIS, ROUTINE W REFLEX MICROSCOPIC
Bilirubin Urine: NEGATIVE
HGB URINE DIPSTICK: NEGATIVE
KETONES UR: 5 mg/dL — AB
Leukocytes, UA: NEGATIVE
NITRITE: POSITIVE — AB
Protein, ur: NEGATIVE mg/dL
SPECIFIC GRAVITY, URINE: 1.011 (ref 1.005–1.030)
pH: 6 (ref 5.0–8.0)

## 2016-06-19 LAB — BRAIN NATRIURETIC PEPTIDE: B Natriuretic Peptide: 339.6 pg/mL — ABNORMAL HIGH (ref 0.0–100.0)

## 2016-06-19 LAB — MAGNESIUM: MAGNESIUM: 1.8 mg/dL (ref 1.7–2.4)

## 2016-06-19 LAB — I-STAT TROPONIN, ED: Troponin i, poc: 0 ng/mL (ref 0.00–0.08)

## 2016-06-19 LAB — GLUCOSE, CAPILLARY
GLUCOSE-CAPILLARY: 353 mg/dL — AB (ref 65–99)
GLUCOSE-CAPILLARY: 212 mg/dL — AB (ref 65–99)
Glucose-Capillary: 301 mg/dL — ABNORMAL HIGH (ref 65–99)

## 2016-06-19 LAB — CBG MONITORING, ED: GLUCOSE-CAPILLARY: 331 mg/dL — AB (ref 65–99)

## 2016-06-19 LAB — MRSA PCR SCREENING: MRSA by PCR: NEGATIVE

## 2016-06-19 MED ORDER — FUROSEMIDE 10 MG/ML IJ SOLN
20.0000 mg | Freq: Once | INTRAMUSCULAR | Status: AC
  Administered 2016-06-19: 20 mg via INTRAVENOUS

## 2016-06-19 MED ORDER — GUAIFENESIN ER 600 MG PO TB12
600.0000 mg | ORAL_TABLET | Freq: Two times a day (BID) | ORAL | Status: DC
  Administered 2016-06-19 – 2016-06-23 (×9): 600 mg via ORAL
  Filled 2016-06-19 (×9): qty 1

## 2016-06-19 MED ORDER — ALBUTEROL SULFATE (2.5 MG/3ML) 0.083% IN NEBU
2.5000 mg | INHALATION_SOLUTION | Freq: Three times a day (TID) | RESPIRATORY_TRACT | Status: DC
  Filled 2016-06-19: qty 3

## 2016-06-19 MED ORDER — ATORVASTATIN CALCIUM 40 MG PO TABS
40.0000 mg | ORAL_TABLET | Freq: Every day | ORAL | Status: DC
  Administered 2016-06-19 – 2016-06-22 (×4): 40 mg via ORAL
  Filled 2016-06-19 (×4): qty 1

## 2016-06-19 MED ORDER — DEXTROSE 5 % IV SOLN
1.0000 g | INTRAVENOUS | Status: DC
  Administered 2016-06-19 – 2016-06-20 (×2): 1 g via INTRAVENOUS
  Filled 2016-06-19 (×2): qty 10

## 2016-06-19 MED ORDER — ONDANSETRON HCL 4 MG PO TABS: 4.0000 mg | ORAL_TABLET | Freq: Four times a day (QID) | ORAL | Status: DC | PRN

## 2016-06-19 MED ORDER — ACETAMINOPHEN 650 MG RE SUPP: 650.0000 mg | Freq: Four times a day (QID) | RECTAL | Status: DC | PRN

## 2016-06-19 MED ORDER — ONDANSETRON HCL 4 MG/2ML IJ SOLN: 4.0000 mg | Freq: Four times a day (QID) | INTRAMUSCULAR | Status: DC | PRN

## 2016-06-19 MED ORDER — FUROSEMIDE 10 MG/ML IJ SOLN
40.0000 mg | Freq: Two times a day (BID) | INTRAMUSCULAR | Status: DC
  Administered 2016-06-19 – 2016-06-21 (×5): 40 mg via INTRAVENOUS
  Filled 2016-06-19 (×5): qty 4

## 2016-06-19 MED ORDER — FUROSEMIDE 10 MG/ML IJ SOLN
40.0000 mg | Freq: Once | INTRAMUSCULAR | Status: AC
  Administered 2016-06-19: 40 mg via INTRAVENOUS
  Filled 2016-06-19: qty 4

## 2016-06-19 MED ORDER — FUROSEMIDE 10 MG/ML IJ SOLN: 60.0000 mg | Freq: Two times a day (BID) | INTRAMUSCULAR | Status: DC

## 2016-06-19 MED ORDER — INSULIN ASPART 100 UNIT/ML ~~LOC~~ SOLN
0.0000 [IU] | Freq: Three times a day (TID) | SUBCUTANEOUS | Status: DC
  Administered 2016-06-19: 11 [IU] via SUBCUTANEOUS
  Administered 2016-06-19: 15 [IU] via SUBCUTANEOUS
  Administered 2016-06-20: 7 [IU] via SUBCUTANEOUS
  Administered 2016-06-20 – 2016-06-21 (×2): 3 [IU] via SUBCUTANEOUS
  Administered 2016-06-21 – 2016-06-22 (×4): 5 [IU] via SUBCUTANEOUS
  Filled 2016-06-19: qty 1

## 2016-06-19 MED ORDER — FUROSEMIDE 10 MG/ML IJ SOLN
INTRAMUSCULAR | Status: AC
  Filled 2016-06-19: qty 2

## 2016-06-19 MED ORDER — CLOPIDOGREL BISULFATE 75 MG PO TABS
75.0000 mg | ORAL_TABLET | Freq: Every day | ORAL | Status: DC
  Administered 2016-06-19 – 2016-06-23 (×5): 75 mg via ORAL
  Filled 2016-06-19 (×5): qty 1

## 2016-06-19 MED ORDER — ALBUTEROL SULFATE (2.5 MG/3ML) 0.083% IN NEBU: 2.5000 mg | INHALATION_SOLUTION | RESPIRATORY_TRACT | Status: DC | PRN

## 2016-06-19 MED ORDER — ACETAMINOPHEN 325 MG PO TABS: 650.0000 mg | ORAL_TABLET | Freq: Four times a day (QID) | ORAL | Status: DC | PRN

## 2016-06-19 MED ORDER — INSULIN ASPART 100 UNIT/ML ~~LOC~~ SOLN
0.0000 [IU] | Freq: Every day | SUBCUTANEOUS | Status: DC
  Administered 2016-06-19 – 2016-06-22 (×2): 2 [IU] via SUBCUTANEOUS

## 2016-06-19 MED ORDER — INSULIN ASPART 100 UNIT/ML ~~LOC~~ SOLN
7.0000 [IU] | Freq: Once | SUBCUTANEOUS | Status: AC
  Administered 2016-06-19: 7 [IU] via SUBCUTANEOUS

## 2016-06-19 MED ORDER — ALBUTEROL SULFATE (2.5 MG/3ML) 0.083% IN NEBU
2.5000 mg | INHALATION_SOLUTION | Freq: Four times a day (QID) | RESPIRATORY_TRACT | Status: DC
  Administered 2016-06-19 (×3): 2.5 mg via RESPIRATORY_TRACT
  Filled 2016-06-19 (×2): qty 3

## 2016-06-19 MED ORDER — FUROSEMIDE 10 MG/ML IJ SOLN: 40.0000 mg | Freq: Four times a day (QID) | INTRAMUSCULAR | Status: DC

## 2016-06-19 MED ORDER — SODIUM CHLORIDE 0.9% FLUSH
3.0000 mL | Freq: Two times a day (BID) | INTRAVENOUS | Status: DC
  Administered 2016-06-19 – 2016-06-23 (×8): 3 mL via INTRAVENOUS

## 2016-06-19 NOTE — ED Provider Notes (Addendum)
[place] DEPT Provider Note   CSN: [id number] Arrival date & time: 06/19/16  2725     History   Chief Complaint Chief Complaint  Patient presents with  . Respiratory Distress    HPI Devin Hanna is a 66 y.o. male.  HPI  LEVEL 5 CAVEAT FOR SEVERE RESPIRATORY DISTRESS Pt comes in with cc of shortness of breath. Pt has hx of CHF, DM, CAD.  Per EMS, patient was in his truck and called EMS for help. When they arrived, pt was in respiratory distress and hypoxic in the 50s. They gave patient some nebs and solumedrol en route and they had to bag patient. Pt's O2 sats in the 80s at arrival. EMS noted that pt was confused initially, but now he is more alert.  PT reports no chest pain. He reports no cough, fevers, flu like symptoms. Pt was getting ready for work when he started getting short of breath. Pt has been taking his meds as prescribed.  Past Medical History:  Diagnosis Date  . Acute on chronic systolic heart failure ([place])   . Acute respiratory failure ([place])   . Congestive heart failure ([place])   . Diabetes mellitus    sees Dr. Hardin Negus in [place]  . GERD (gastroesophageal reflux disease)   . Hyperlipidemia   . MI (myocardial infarction) [facility])    "has not seen cardiologist in several years"  . Peripheral neuropathy   . Vertigo     Patient Active Problem List   Diagnosis Date Noted  . Elevated troponin 06/19/2016  . Elevated lactic acid level 06/19/2016  . Neuropathy 01/09/2016  . Elevated LFTs 06/28/2015  . Penile mass 06/28/2015  . Blood loss anemia 11/01/2014  . Peptic ulcer disease 09/12/2014  . Upper GI bleed 09/10/2014  . Erectile dysfunction 09/07/2013  . Acute on chronic systolic heart failure ([place]) 05/15/2013  . Hyperlipidemia 05/15/2013  . DM type 2 (diabetes mellitus, type 2) ([place]) 04/07/2013  . Other and unspecified hyperlipidemia 04/07/2013  . Morbid obesity ([place]) 04/07/2013  . HTN (hypertension) 04/07/2013  . Acute respiratory failure  ([place]) 04/04/2013  . CAD (coronary artery disease) 04/04/2013    Past Surgical History:  Procedure Laterality Date  . CARDIAC CATHETERIZATION N/A 10/03/2014   Procedure: Left Heart Cath and Coronary Angiography;  Surgeon: [name], MD;  Location: [place] CV LAB;  Service: Cardiovascular;  Laterality: N/A;  . CIRCUMCISION N/A 07/22/2015   Procedure: CIRCUMCISION ADULT;  Surgeon: Hollice Espy, MD;  Location: [facility] ORS;  Service: Urology;  Laterality: N/A;  . CORONARY STENT PLACEMENT    . ESOPHAGOGASTRODUODENOSCOPY (EGD) WITH PROPOFOL N/A 09/12/2014   Procedure: ESOPHAGOGASTRODUODENOSCOPY (EGD) WITH PROPOFOL;  Surgeon: Ronald Lobo, MD;  Location: [facility] ENDOSCOPY;  Service: Endoscopy;  Laterality: N/A;  . EXTERNAL FIXATION LEG Left 04/03/2012   Procedure: EXTERNAL FIXATION LEG;  Surgeon: Wylene Simmer, MD;  Location: [place];  Service: Orthopedics;  Laterality: Left;  . EXTERNAL FIXATION REMOVAL Left 04/14/2012   Procedure: REMOVAL EXTERNAL FIXATION LEG;  Surgeon: Wylene Simmer, MD;  Location: [place];  Service: Orthopedics;  Laterality: Left;  . IRRIGATION AND DEBRIDEMENT KNEE Left 04/03/2012   Procedure: IRRIGATION AND DEBRIDEMENT KNEE;  Surgeon: Wylene Simmer, MD;  Location: [place];  Service: Orthopedics;  Laterality: Left;  . LEFT HEART CATHETERIZATION WITH CORONARY ANGIOGRAM N/A 04/05/2013   Procedure: LEFT HEART CATHETERIZATION WITH CORONARY ANGIOGRAM;  Surgeon: [name], MD;  Location: [place] CATH LAB;  Service: Cardiovascular;  Laterality: N/A;  . MASS EXCISION N/A  07/22/2015   Procedure: EXCISION PENILE MASS;  Surgeon: Hollice Espy, MD;  Location: [facility] ORS;  Service: Urology;  Laterality: N/A;  . ORIF TIBIA PLATEAU Left 04/14/2012   Procedure: OPEN REDUCTION INTERNAL FIXATION (ORIF) TIBIAL PLATEAU;  Surgeon: Wylene Simmer, MD;  Location: [place];  Service: Orthopedics;  Laterality: Left;  . PERCUTANEOUS CORONARY ROTOBLATOR INTERVENTION (PCI-R) N/A 04/06/2013   Procedure: PERCUTANEOUS CORONARY  ROTOBLATOR INTERVENTION (PCI-R);  Surgeon: [name], MD;  Location: [facility] CATH LAB;  Service: Cardiovascular;  Laterality: N/A;       Home Medications    Prior to Admission medications   Medication Sig Start Date End Date Taking? Authorizing Provider  atorvastatin (LIPITOR) 40 MG tablet Take 1 tablet (40 mg total) by mouth daily. 01/07/16   [name], MD  carvedilol (COREG) 12.5 MG tablet Take 1 tablet (12.5 mg total) by mouth 2 (two) times daily with a meal. 10/21/15   [name], MD  Cinnamon 500 MG capsule Take 1,000 mg by mouth 2 (two) times daily with a meal.    [provider]  clopidogrel (PLAVIX) 75 MG tablet Take 1 tablet (75 mg total) by mouth daily with breakfast. 03/27/16   [name], MD  docusate sodium (COLACE) 100 MG capsule Take 1 capsule (100 mg total) by mouth 2 (two) times daily. 07/22/15   Hollice Espy, MD  ferrous sulfate (FERROUSUL) 325 (65 FE) MG tablet Take 1 tablet (325 mg total) by mouth 2 (two) times daily with a meal. 11/01/14   [name], MD  furosemide (LASIX) 40 MG tablet Take 1 tablet (40 mg total) by mouth daily. 04/24/16   [name], MD  gabapentin (NEURONTIN) 100 MG capsule Take 2 capsules (200 mg total) by mouth 3 (three) times daily. 01/09/16   [name], MD  GLIPIZIDE XL 10 MG 24 hr tablet TAKE ONE TABLET BY MOUTH ONCE DAILY Earl Lagos 03/27/16   [name], MD  glucose blood test strip Check once daily fasting. True test test strips. 02/24/16   [name], MD  [name] 100 MG tablet TAKE 1/2 TABLET BY MOUTH DAILY 04/21/16   [name], MD  metFORMIN (GLUCOPHAGE-XR) 500 MG 24 hr tablet Take 2 tablets (1,000 mg total) by mouth 2 (two) times daily with a meal. 03/05/16   [name], MD  Multiple Vitamin (MULTIVITAMIN WITH MINERALS) TABS Take 1 tablet by mouth daily.    [provider]  nitroGLYCERIN (NITROSTAT) 0.4 MG SL tablet Place 1 tablet (0.4 mg total)  under the tongue every 5 (five) minutes as needed for chest pain. 10/21/15   [name], MD  pantoprazole (PROTONIX) 40 MG tablet Take 1 tablet (40 mg total) by mouth 2 (two) times daily. 05/25/16   [name], MD  quinapril (ACCUPRIL) 20 MG tablet Take 1 tablet (20 mg total) by mouth daily. 05/25/16   [name], MD    Family History Family History  Problem Relation Age of Onset  . Coronary artery disease Father   . Coronary artery disease Brother   . Lung cancer Brother   . Breast cancer Sister   . Prostate cancer Brother   . Stroke Sister     Social History Social History  Substance Use Topics  . Smoking status: Former Smoker    Quit date: 04/05/1995  . Smokeless tobacco: Former Systems developer  . Alcohol use No     Comment: Occassional Use  Allergies   Patient has no known allergies.   Review of Systems Review of Systems  Unable to perform ROS: Severe respiratory distress  Respiratory: Positive for shortness of breath.   Cardiovascular: Negative for chest pain.     Physical Exam Updated Vital Signs BP (!) 98/55   Pulse 76   Temp (!) 96.7 F (35.9 C) (Temporal)   Resp (!) 26   SpO2 100%   Physical Exam  Constitutional: He is oriented to person, place, and time. He appears well-developed.  HENT:  Head: Normocephalic and atraumatic.  Eyes: Conjunctivae and EOM are normal. Pupils are equal, round, and reactive to light.  Neck: Normal range of motion. Neck supple.  Cardiovascular: Normal rate and regular rhythm.   Pulmonary/Chest: Effort normal and breath sounds normal.  Abdominal: Soft. Bowel sounds are normal. He exhibits no distension and no mass. There is no tenderness. There is no rebound and no guarding.  Musculoskeletal: He exhibits no deformity.  Neurological: He is alert and oriented to person, place, and time.  Skin: Skin is warm.  Nursing note and vitals reviewed.    ED Treatments / Results  Labs (all labs ordered are listed, but  only abnormal results are displayed) Labs Reviewed  CBC WITH DIFFERENTIAL/PLATELET - Abnormal; Notable for the following:       Result Value   WBC 12.3 (*)    Neutro Abs 8.0 (*)    All other components within normal limits  BRAIN NATRIURETIC PEPTIDE - Abnormal; Notable for the following:    B Natriuretic Peptide 339.6 (*)    All other components within normal limits  I-STAT CHEM 8, ED - Abnormal; Notable for the following:    Glucose, Bld 358 (*)    Calcium, Ion 1.04 (*)    All other components within normal limits  I-STAT CG4 LACTIC ACID, ED - Abnormal; Notable for the following:    Lactic Acid, Venous 3.93 (*)    All other components within normal limits  TROPONIN I  MAGNESIUM  TROPONIN I  TROPONIN I  TROPONIN I  HEMOGLOBIN A1C  LACTIC ACID, PLASMA  URINALYSIS, ROUTINE W REFLEX MICROSCOPIC  I-STAT TROPOININ, ED    EKG  EKG Interpretation  Date/Time:  Friday Jun 19 2016 06:38:44 EDT Ventricular Rate:  85 PR Interval:    QRS Duration: 161 QT Interval:  444 QTC Calculation: 528 R Axis:   108 Text Interpretation:  Sinus rhythm Nonspecific intraventricular conduction delay Anterior infarct, old Borderline ST depression, lateral leads No significant change since last tracing Confirmed by Varney Biles [phone]) on 06/19/2016 6:52:51 AM       Radiology Dg Chest Portable 1 View  Result Date: 06/19/2016 CLINICAL DATA:  Shortness of breath.  Wheezing.  History of CHF. EXAM: PORTABLE CHEST 1 VIEW COMPARISON:  09/09/2014 FINDINGS: Cardiac enlargement with mild pulmonary vascular congestion. Small bilateral pleural effusions with basilar interstitial edema. There is asymmetric increased opacity in the right lung base which may indicate developing asymmetric edema or superimposed pneumonia. No pneumothorax. Calcification of the aorta. Old right rib fractures. IMPRESSION: Cardiac enlargement with pulmonary vascular congestion and mild interstitial edema. Increasing opacity in the  right lung base may indicate developing asymmetric edema or superimposed pneumonia. Electronically Signed   By: Lucienne Capers M.D.   On: 06/19/2016 06:52    Procedures Procedures (including critical care time)  CRITICAL CARE Performed by: Varney Biles   Total critical care time: 41 minutes for acute hypoxic respiratory failure  Critical care time was  exclusive of separately billable procedures and treating other patients.  Critical care was necessary to treat or prevent imminent or life-threatening deterioration.  Critical care was time spent personally by me on the following activities: development of treatment plan with patient and/or surrogate as well as nursing, discussions with consultants, evaluation of patient's response to treatment, examination of patient, obtaining history from patient or surrogate, ordering and performing treatments and interventions, ordering and review of laboratory studies, ordering and review of radiographic studies, pulse oximetry and re-evaluation of patient's condition.   Medications Ordered in ED Medications  furosemide (LASIX) injection 40 mg (not administered)  sodium chloride flush (NS) 0.9 % injection 3 mL (not administered)  acetaminophen (TYLENOL) tablet 650 mg (not administered)    Or  acetaminophen (TYLENOL) suppository 650 mg (not administered)  ondansetron (ZOFRAN) tablet 4 mg (not administered)    Or  ondansetron (ZOFRAN) injection 4 mg (not administered)  insulin aspart (novoLOG) injection 0-15 Units (not administered)  insulin aspart (novoLOG) injection 0-5 Units (not administered)  insulin aspart (novoLOG) injection 7 Units (not administered)  furosemide (LASIX) injection 40 mg (40 mg Intravenous Given 06/19/16 0741)     Initial Impression / Assessment and Plan / ED Course  I have reviewed the triage vital signs and the nursing notes.  Pertinent labs & imaging results that were available during my care of the patient were  reviewed by me and considered in my medical decision making (see chart for details).  Clinical Course as of Jun 20 810  Fri Jun 19, 2016  0809 It's from work of breathing and not infection in my opinion. The patient is noted to have a lactate>2. With the current information available to me, I don't think the patient is in septic shock. The lactate>2 is related to respiratory distress/ respiratory failure due to pulmonary edema.  Lactic Acid, Venous: (!!) 3.93 [AN]  [id number] Results from the ER workup discussed with the patient face to face and all questions answered to the best of my ability.   [AN]    Clinical Course User Index [AN] Varney Biles, MD    Pt comes in respiratory failure. Pt placed on bipap. Pt has diffused rhonchi and rales at the base. Concerns for CHF exacerbation based on hx. CXR confirms edema. Pt denies any infection like symptoms or flu like symptoms. Will admit to medicine.  Final Clinical Impressions(s) / ED Diagnoses   Final diagnoses:  Acute respiratory failure with hypoxia (HCC)  Acute on chronic systolic congestive heart failure [facility])    New Prescriptions New Prescriptions   No medications on file     Varney Biles, MD 06/19/16 [place], [place], MD 06/19/16 [phone]

## 2016-06-19 NOTE — ED Notes (Signed)
Patient denies pain and is resting comfortably. Pt is on Bi-pap. States he is breathing better

## 2016-06-19 NOTE — Progress Notes (Signed)
RT NOTE:  Pt currently off BIPAP at this time. Pt tolerating 3.5L [place] well. No SOB, WOB normal. Pt understands to call RN/RT if this changes. BIPAP available @ bedside.

## 2016-06-19 NOTE — Progress Notes (Signed)
Pharmacy Antibiotic Note Devin Hanna is a 66 y.o. male admitted on 06/19/2016 with pneumonia.  Pharmacy has been consulted for ceftriaxone dosing.  Plan: 1. Ceftriaxone 1 gram IV every 24 hours  2. Will follow peripherally    Temp (24hrs), Avg:97.1 F (36.2 C), Min:96.7 F (35.9 C), Max:97.5 F (36.4 C)   Recent Labs Lab 06/19/16 0645 06/19/16 0647 06/19/16 0652  WBC 12.3*  --   --   CREATININE  --   --  1.10  LATICACIDVEN  --  3.93*  --     CrCl cannot be calculated (Unknown ideal weight.).    No Known Allergies  Thank you for allowing pharmacy to be a part of this patient's care.  Vincenza Hews, [name], BCPS 06/19/2016, 9:38 AM

## 2016-06-19 NOTE — ED Triage Notes (Addendum)
Pt comes via [facility], called out for SOB, hx of CHF, PTA received duoneb, 25 solumedrol, wheezing in all lobes, absent in lower. Pt has two NPA in place, minimally responsive with EMS.

## 2016-06-19 NOTE — ED Notes (Signed)
Admitting in room.

## 2016-06-19 NOTE — ED Notes (Signed)
Pt placed on bipap, tolerating well

## 2016-06-19 NOTE — Care Management Note (Signed)
Case Management Note  Patient Details  Name: Devin Hanna MRN: [id number] Date of Birth: Dec 22, 1950  Subjective/Objective:                  From home. /66 y.o. male with medical history significant for CAD status post stents 2016, CHF, diabetes, hypertension, hyperlipidemia, MI, obesity presents to the emergency Department chief complaint sudden shortness of breath. Initial evaluation reveals acute respiratory failure likely related to acute on chronic systolic heart failure.  Action/Plan: Admit status INPATIENT (Acute respiratory failure); anticipate discharge [place].    Expected Discharge Date:   (unknown)               Expected Discharge Plan:  [place]  In-House Referral:     Discharge planning Services  CM Consult  Post Acute Care Choice:    Choice offered to:     DME Arranged:    DME Agency:     HH Arranged:    HH Agency:     Status of Service:  In process, will continue to follow  If discussed at Long Length of Stay Meetings, dates discussed:    Additional Comments:  Fuller Mandril, RN 06/19/2016, 9:08 AM

## 2016-06-19 NOTE — ED Notes (Signed)
Yellow socks and fall risk arm band placed.

## 2016-06-19 NOTE — Consult Note (Signed)
CARDIOLOGY CONSULT NOTE   Patient ID: Devin Hanna MRN: [id number] DOB/AGE: September 19, 1950 66 y.o.  Admit date: 06/19/2016  Requesting Physician: Dr. Aggie Moats Primary Physician:   [name], MD Primary Cardiologist:   Dr. Fletcher Anon Reason for Consultation:   Respiratory distress, CHF, CAD, HTN  HPI: Devin Hanna is a 66 y.o. male who is being seen today in consultation at the request of Dr. Aggie Moats for Respiratory distress, CHF, CAD, HTN .  The patient has as PMH of CAD hx of RCA stents and prior MI, chronic systolic heart failure, DM, HTN, HLD. Cardiac catheterization (2015) showed 99%  in-stent restenosis in the right coronary artery which was heavily calcified. There was also 60-70% distal LAD stenosis. EF was 30-35% by echo. Hx of LBB.  He presented to the ER as a level V caveat for severe respiratory distress. He was in his truck and had to call EMS. He was sating in the 50s at that time and they had to bag him en route and gave breathing treatments, improving his O2 sats to 80%. He denied having CP or sx of illness except for this morning he did have pressure in his abdomen with the sensation of needing to belch.  His chest xray confirmed edema, BNP 339, placed on BiPap,  Troponin > 0.03 ? 0.06.  Pt admitted to medicine, we have been asked to consult for above reasons.  Medicine is concerned for CHF with possible superimposed pneumonia, treating him as possible SIRS/SEPSIS and admitted to step down.  Today's Weight: Need weights lb Admission Weight:  Need Weights lb Net loss: n/a Liters Blood Pressure: 128/61  Hr: 69 Labs:  Creatinine 1.10,  K 4.6 , Na  138 , WBC 12.3, Hbg 16.3      Past Medical History:  Diagnosis Date  . Acute on chronic systolic heart failure ([place])   . Acute respiratory failure ([place])   . Congestive heart failure ([place])   . Diabetes mellitus    sees Dr. Hardin Negus in [place]  . GERD (gastroesophageal reflux disease)   . Hyperlipidemia   .  LBBB (left bundle branch block)    Old  . MI (myocardial infarction) ([place])    "has not seen cardiologist in several years"  . Peripheral neuropathy   . Vertigo      Past Surgical History:  Procedure Laterality Date  . CARDIAC CATHETERIZATION N/A 10/03/2014   Procedure: Left Heart Cath and Coronary Angiography;  Surgeon: [name], MD;  Location: [place] [facility];  Service: Cardiovascular;  Laterality: N/A;  . CIRCUMCISION N/A 07/22/2015   Procedure: CIRCUMCISION ADULT;  Surgeon: Hollice Espy, MD;  Location: [facility];  Service: Urology;  Laterality: N/A;  . CORONARY STENT PLACEMENT    . ESOPHAGOGASTRODUODENOSCOPY (EGD) WITH PROPOFOL N/A 09/12/2014   Procedure: ESOPHAGOGASTRODUODENOSCOPY (EGD) WITH PROPOFOL;  Surgeon: Ronald Lobo, MD;  Location[facility] ENDOS[facility];  Service: Endoscopy;  Laterality: N/A;  . EXTERNAL FIXATION LEG Left 04/03/2012   Procedure: EXTERNAL FIXATION LEG;  Surgeon: Wylene Simmer, MD;  Location: [place];  Service: Orthopedics;  Laterality: Left;  . EXTERNAL FIXATION REMOVAL Left 04/14/2012   Procedure: REMOVAL EXTERNAL FIXATION LEG;  Surgeon: Wylene Simmer, MD;  Location: [place];  Service: Orthopedics;  Laterality: Left;  . IRRIGATION AND DEBRIDEMENT KNEE Left 04/03/2012   Procedure: IRRIGATION AND DEBRIDEMENT KNEE;  Surgeon: Wylene Simmer, MD;  Location: [place];  Service: Orthopedics;  Laterality: Left;  . LEFT HEART CATHETERIZATION WITH CORONARY  ANGIOGRAM N/A 04/05/2013   Procedure: LEFT HEART CATHETERIZATION WITH CORONARY ANGIOGRAM;  Surgeon: [name], MD;  Location: [place] CATH [facility];  Service: Cardiovascular;  Laterality: N/A;  . MASS EXCISION N/A 07/22/2015   Procedure: EXCISION PENILE MASS;  Surgeon: Hollice Espy, MD;  Location: [facility];  Service: Urology;  Laterality: N/A;  . ORIF TIBIA PLATEAU Left 04/14/2012   Procedure: OPEN REDUCTION INTERNAL FIXATION (ORIF) TIBIAL PLATEAU;  Surgeon: Wylene Simmer, MD;  Location: [place];  Service: Orthopedics;  Laterality: Left;  .  PERCUTANEOUS CORONARY ROTOBLATOR INTERVENTION (PCI-R) N/A 04/06/2013   Procedure: PERCUTANEOUS CORONARY ROTOBLATOR INTERVENTION (PCI-R);  Surgeon: [name], MD;  Location: [facility] CATH LAB;  Service: Cardiovascular;  Laterality: N/A;    No Known Allergies  I have reviewed the patient's current medications . albuterol  2.5 mg Nebulization Q6H  . furosemide  40 mg Intravenous Q6H  . guaiFENesin  600 mg Oral BID  . insulin aspart  0-15 Units Subcutaneous TID WC  . insulin aspart  0-5 Units Subcutaneous QHS  . sodium chloride flush  3 mL Intravenous Q12H   . cefTRIAXone (ROCEPHIN)  IV Stopped (06/19/16 1026)   acetaminophen **OR** acetaminophen, albuterol, ondansetron **OR** ondansetron (ZOFRAN) IV  Prior to Admission medications   Medication Sig Start Date End Date Taking? Authorizing Provider  atorvastatin (LIPITOR) 40 MG tablet Take 1 tablet (40 mg total) by mouth daily. 01/07/16  Yes [name], MD  carvedilol (COREG) 12.5 MG tablet Take 1 tablet (12.5 mg total) by mouth 2 (two) times daily with a meal. 10/21/15  Yes Arida, Mertie Clause, MD  Cinnamon 500 MG capsule Take 1,000 mg by mouth 2 (two) times daily with a meal.   Yes [provider]  clopidogrel (PLAVIX) 75 MG tablet Take 1 tablet (75 mg total) by mouth daily with breakfast. 03/27/16  Yes [name], MD  ferrous sulfate (FERROUSUL) 325 (65 FE) MG tablet Take 1 tablet (325 mg total) by mouth 2 (two) times daily with a meal. 11/01/14  Yes [name], MD  furosemide (LASIX) 40 MG tablet Take 1 tablet (40 mg total) by mouth daily. Patient taking differently: Take 40 mg by mouth at bedtime.  04/24/16  Yes [name], MD  gabapentin (NEURONTIN) 100 MG capsule Take 2 capsules (200 mg total) by mouth 3 (three) times daily. 01/09/16  Yes [name], MD  GLIPIZIDE XL 10 MG 24 hr tablet TAKE ONE TABLET BY MOUTH ONCE DAILY WITHBREAKFAST 03/27/16  Yes [name], MD  [name] 100 MG tablet TAKE  1/2 TABLET BY MOUTH DAILY 04/21/16  Yes [name], MD  metFORMIN (GLUCOPHAGE-XR) 500 MG 24 hr tablet Take 2 tablets (1,000 mg total) by mouth 2 (two) times daily with a meal. 03/05/16  Yes [name], MD  Multiple Vitamin (MULTIVITAMIN WITH MINERALS) TABS Take 1 tablet by mouth daily.   Yes [provider]  quinapril (ACCUPRIL) 20 MG tablet Take 1 tablet (20 mg total) by mouth daily. 05/25/16  Yes [name], MD  docusate sodium (COLACE) 100 MG capsule Take 1 capsule (100 mg total) by mouth 2 (two) times daily. Patient not taking: Reported on 06/19/2016 07/22/15   Hollice Espy, MD  nitroGLYCERIN (NITROSTAT) 0.4 MG SL tablet Place 1 tablet (0.4 mg total) under the tongue every 5 (five) minutes as needed for chest pain. 10/21/15   [name], MD  pantoprazole (PROTONIX) 40 MG tablet Take 1 tablet (40 mg total)  by mouth 2 (two) times daily. Patient not taking: Reported on 06/19/2016 05/25/16   [name], MD     Social History   Social History  . Marital status: Divorced    Spouse name: N/A  . Number of children: N/A  . Years of education: N/A   Occupational History  . heavy Runner, broadcasting/film/video   Social History Main Topics  . Smoking status: Former Smoker    Quit date: 04/05/1995  . Smokeless tobacco: Former Systems developer  . Alcohol use No     Comment: Occassional Use  . Drug use: No  . Sexual activity: Not on file   Other Topics Concern  . Not on file   Social History Narrative   Heavy Company secretary.       Divorced    Family Status  Relation Status  . Father Deceased  . Mother Deceased  . Brother (Not Specified)  . Brother (Not Specified)  . Sister (Not Specified)  . Brother (Not Specified)  . Sister (Not Specified)   Family History  Problem Relation Age of Onset  . Coronary artery disease Father   . Coronary artery disease Brother   . Lung cancer Brother   . Breast cancer Sister   . Prostate cancer Brother   .  Stroke Sister         ROS:  Full 14 point review of systems complete and found to be negative unless listed above.  Physical Exam: Blood pressure 103/61, pulse 65, temperature 97.5 F (36.4 C), temperature source Oral, resp. rate (!) 23, SpO2 100 %.  General: Well developed, well nourished, not in distress at this time, on BiPAP Head: No xanthomas. Normocephalic and atraumatic, Lungs: On Bipap, increased effort of breathing which worsens with talking. + wheezes and crackles Heart:: normal rate and rhythm  + JVD  Neck: No carotid bruits. No lymphadenopathy. Abdomen: Bowel sounds present, abdomen soft and non-tender Msk:  Spontaneously moves all 4 extremities Extremities: No clubbing or cyanosis.  + le pitting edema Neuro: Alert and oriented X 3. No focal deficits noted. Psych:  Good affect, responds appropriately Skin: No rashes or lesions noted.     Labs:  Lab Results  Component Value Date   WBC 12.3 (H) 06/19/2016   HGB 16.7 06/19/2016   HCT 49.0 06/19/2016   MCV 97.2 06/19/2016   PLT 261 06/19/2016   No results for input(s): INR in the last 72 hours.  Recent Labs Lab 06/19/16 0652  NA 138  K 4.6  CL 103  BUN 9  CREATININE 1.10  GLUCOSE 358*   Magnesium  Date Value Ref Range Status  06/19/2016 1.8 1.7 - 2.4 mg/dL Final    Comment:    SLIGHT HEMOLYSIS    Recent Labs  06/19/16 0645 06/19/16 0751  TROPONINI <0.03 0.06*    Recent Labs  06/19/16 0643  TROPIPOC 0.00   Pro B Natriuretic peptide (BNP)  Date/Time Value Ref Range Status  04/04/2013 05:26 AM 272.5 (H) 0 - 125 pg/mL Final   Lab Results  Component Value Date   CHOL 98 01/17/2016   HDL 32.80 (L) 01/17/2016   LDLCALC 36 01/17/2016   TRIG 146.0 01/17/2016   No results found for: DDIMER No results found for: LIPASE, AMYLASE TSH  Date/Time Value Ref Range Status  09/09/2014 03:05 AM 0.889 0.350 - 4.500 uIU/mL Final  04/04/2013 10:20 AM 3.056 0.350 - 4.500 uIU/mL Final    Comment:     Performed at [facility]    Echo   Pending for this admission  ECG:     HR 85, sinus rhythm, nonspecific Itranventricular conduction delay. No ischemic changes. -personally reviewed.   Radiology:Dg Chest Portable 1 View  Result Date: 06/19/2016 CLINICAL DATA:  Shortness of breath.  Wheezing.  History of CHF. EXAM: PORTABLE CHEST 1 VIEW COMPARISON:  09/09/2014 FINDINGS: Cardiac enlargement with mild pulmonary vascular congestion. Small bilateral pleural effusions with basilar interstitial edema. There is asymmetric increased opacity in the right lung base which may indicate developing asymmetric edema or superimposed pneumonia. No pneumothorax. Calcification of the aorta. Old right rib fractures. IMPRESSION: Cardiac enlargement with pulmonary vascular congestion and mild interstitial edema. Increasing opacity in the right lung base may indicate developing asymmetric edema or superimposed pneumonia. Electronically Signed   By: Lucienne Capers M.D.   On: 06/19/2016 06:52        ASSESSMENT AND PLAN:      Principal Problem:   Acute respiratory failure (HCC) Active Problems:   CAD (coronary artery disease)   DM type 2 (diabetes mellitus, type 2) (HCC)   Morbid obesity (HCC)   HTN (hypertension)   Acute on chronic systolic heart failure (HCC)   Hyperlipidemia   Peptic ulcer disease   Elevated lactic acid level   Acute on chronic systolic heart failure/ Respiratory failure: Chest xray showed pulmonary vascular congestion, interstitial edema. Pt is on BiPAP. EKG shows no ischemic changes. -- given 40 mg IV lasix, takes 40 mg PO daily. Recommend increase to 60 mg BID IV. -- Echocardiogram is pending -- pt needs strict I/Os and daily weights. Continue holding the ACE inhibitor. Beta Blocker is being held because blood pressure is soft, will need to restart this at least on a low dose when HR and BP can tolerate. -- monitor lytes, BP and creatinine closely  -- Current Creatinine  is 1.10.  Elevated Troponin  > 0.03  ? 0.06: Likely from demand ischemia, will continue to cycle enzymes. Monitor closely for chest pain, so fair he has been pain free. -- no plan for ischemic work-up at this time.   CAD:  Cath in 09/2014 widely patent RCA stent with minimal restenosis. Pt denies having any CP. On plavix without ASA due to gastric ulcer.  -- Troponin is mildly elevated, likely due to demand ischemia. Will continue to cycle enzymes and monitor closely. -- continue Plavix  Hypertension: currently liable BP's. BP range 98/55 --- 159/95 -- Hold Coreg for now, but reinitiate early when BP and HR can tolerate.  -- Hold Quinapril until HR/BP more stable.  Hyperlipidemia:  -- LDL last checked 112/09/2015, 36 -- Continue Lipitor  Diabetes: Medicine to manage Elevated lactic acid: Medicine to manage -- on IV Rocephin   Will discuss with Dr. Debara Pickett.    SignedLinus Mako, PA-C 06/19/2016 11:07 AM

## 2016-06-19 NOTE — H&P (Signed)
History and Physical    Devin Hanna CBJ:[id number] DOB: 12-03-1950 DOA: 06/19/2016  PCP: [name], MD Patient coming from: home  Chief Complaint: sob  HPI: Devin Hanna is a very pleasant 66 y.o. male with medical history significant for CAD status post stents 2016, CHF, diabetes, hypertension, hyperlipidemia, MI, obesity presents to the emergency Department chief complaint sudden shortness of breath. Initial evaluation reveals acute respiratory failure likely related to acute on chronic systolic heart failure.  Information is obtained from the patient and his family who are at the bedside. He reports being in his usual state of health until this morning he developed sudden shortness of breath. He denies any dyspnea with exertion at baseline. He states he was getting ready for work was a [place] short of breath with that activity. as he was in the car ready to leave he suddenly could not "breathe at all". EMS was called reportedly had an oxygen saturation level less than 50's. He had audible wheezing and was provided with Solu-Medrol and duo nebs. Transported to the emergency department being bagged. He denies chest pain palpitation worsening lower extremity edema. He does acknowledge that his abdomen this morning "seems tighter". He denies headache dizziness syncope or near-syncope. He denies abdominal pain nausea vomiting diarrhea constipation melena. He denies dysuria hematuria frequency or urgency. He denies any recent travel or sick contacts.   ED Course: In the emergency department he's afebrile with a somewhat labile blood pressure tachypnea hypoxia elevated lactic acid. He is provided with Lasix 40 mg IV. He is placed on BiPAP. At the time of admission he is resting comfortably with a somewhat soft blood pressure respiratory rate of 26 oxygen saturation level 97%  Review of Systems: As per HPI otherwise all other systems reviewed and are negative.   Ambulatory Status: He lives  alone family nearby. 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 Acute on chronic systolic heart failure ([place])   . Acute respiratory failure ([place])   . Congestive heart failure ([place])   . Diabetes mellitus    sees Dr. Hardin Negus in [place]  . GERD (gastroesophageal reflux disease)   . Hyperlipidemia   . MI (myocardial infarction) [facility])    "has not seen cardiologist in several years"  . Peripheral neuropathy   . Vertigo     Past Surgical History:  Procedure Laterality Date  . CARDIAC CATHETERIZATION N/A 10/03/2014   Procedure: Left Heart Cath and Coronary Angiography;  Surgeon: [name], MD;  Location: [place] CV LAB;  Service: Cardiovascular;  Laterality: N/A;  . CIRCUMCISION N/A 07/22/2015   Procedure: CIRCUMCISION ADULT;  Surgeon: Hollice Espy, MD;  Location: [facility] ORS;  Service: Urology;  Laterality: N/A;  . CORONARY STENT PLACEMENT    . ESOPHAGOGASTRODUODENOSCOPY (EGD) WITH PROPOFOL N/A 09/12/2014   Procedure: ESOPHAGOGASTRODUODENOSCOPY (EGD) WITH PROPOFOL;  Surgeon: Ronald Lobo, MD;  Location: [facility] ENDOSCOPY;  Service: Endoscopy;  Laterality: N/A;  . EXTERNAL FIXATION LEG Left 04/03/2012   Procedure: EXTERNAL FIXATION LEG;  Surgeon: Wylene Simmer, MD;  Location: [place];  Service: Orthopedics;  Laterality: Left;  . EXTERNAL FIXATION REMOVAL Left 04/14/2012   Procedure: REMOVAL EXTERNAL FIXATION LEG;  Surgeon: Wylene Simmer, MD;  Location: [place];  Service: Orthopedics;  Laterality: Left;  . IRRIGATION AND DEBRIDEMENT KNEE Left 04/03/2012   Procedure: IRRIGATION AND DEBRIDEMENT KNEE;  Surgeon: Wylene Simmer, MD;  Location: [place];  Service: Orthopedics;  Laterality: Left;  . LEFT HEART CATHETERIZATION WITH CORONARY ANGIOGRAM N/A 04/05/2013   Procedure: LEFT HEART CATHETERIZATION WITH CORONARY ANGIOGRAM;  Surgeon: [name], MD;  Location: [place] CATH LAB;  Service: Cardiovascular;  Laterality: N/A;  .  MASS EXCISION N/A 07/22/2015   Procedure: EXCISION PENILE MASS;  Surgeon: Hollice Espy, MD;  Location: [facility] ORS;  Service: Urology;  Laterality: N/A;  . ORIF TIBIA PLATEAU Left 04/14/2012   Procedure: OPEN REDUCTION INTERNAL FIXATION (ORIF) TIBIAL PLATEAU;  Surgeon: Wylene Simmer, MD;  Location: [place];  Service: Orthopedics;  Laterality: Left;  . PERCUTANEOUS CORONARY ROTOBLATOR INTERVENTION (PCI-R) N/A 04/06/2013   Procedure: PERCUTANEOUS CORONARY ROTOBLATOR INTERVENTION (PCI-R);  Surgeon: [name], MD;  Location: [facility] CATH LAB;  Service: Cardiovascular;  Laterality: N/A;    Social History   Social History  . Marital status: Divorced    Spouse name: N/A  . Number of children: N/A  . Years of education: N/A   Occupational History  . heavy Runner, broadcasting/film/video   Social History Main Topics  . Smoking status: Former Smoker    Quit date: 04/05/1995  . Smokeless tobacco: Former Systems developer  . Alcohol use No     Comment: Occassional Use  . Drug use: No  . Sexual activity: Not on file   Other Topics Concern  . Not on file   Social History Narrative   Heavy Company secretary.       Divorced    No Known Allergies  Family History  Problem Relation Age of Onset  . Coronary artery disease Father   . Coronary artery disease Brother   . Lung cancer Brother   . Breast cancer Sister   . Prostate cancer Brother   . Stroke Sister     Prior to Admission medications   Medication Sig Start Date End Date Taking? Authorizing Provider  atorvastatin (LIPITOR) 40 MG tablet Take 1 tablet (40 mg total) by mouth daily. 01/07/16   [name], MD  carvedilol (COREG) 12.5 MG tablet Take 1 tablet (12.5 mg total) by mouth 2 (two) times daily with a meal. 10/21/15   [name], MD  Cinnamon 500 MG capsule Take 1,000 mg by mouth 2 (two) times daily with a meal.    [provider]  clopidogrel (PLAVIX) 75 MG tablet Take 1 tablet (75 mg total) by mouth daily with  breakfast. 03/27/16   [name], MD  docusate sodium (COLACE) 100 MG capsule Take 1 capsule (100 mg total) by mouth 2 (two) times daily. 07/22/15   Hollice Espy, MD  ferrous sulfate (FERROUSUL) 325 (65 FE) MG tablet Take 1 tablet (325 mg total) by mouth 2 (two) times daily with a meal. 11/01/14   [name], MD  furosemide (LASIX) 40 MG tablet Take 1 tablet (40 mg total) by mouth daily. 04/24/16   [name], MD  gabapentin (NEURONTIN) 100 MG capsule Take 2 capsules (200 mg total) by mouth 3 (three) times daily. 01/09/16   [name], MD  GLIPIZIDE XL 10 MG 24 hr tablet TAKE ONE TABLET BY MOUTH ONCE DAILY Earl [place] 03/27/16   [name], MD  glucose blood test strip Check once daily fasting. True test test strips. 02/24/16   [name], MD  [name] 100 MG tablet TAKE 1/2 TABLET BY MOUTH DAILY 04/21/16   [name], MD  metFORMIN (GLUCOPHAGE-XR) 500 MG 24 hr tablet Take 2 tablets (1,000 mg total) by  mouth 2 (two) times daily with a meal. 03/05/16   [name], MD  Multiple Vitamin (MULTIVITAMIN WITH MINERALS) TABS Take 1 tablet by mouth daily.    [provider]  nitroGLYCERIN (NITROSTAT) 0.4 MG SL tablet Place 1 tablet (0.4 mg total) under the tongue every 5 (five) minutes as needed for chest pain. 10/21/15   [name], MD  pantoprazole (PROTONIX) 40 MG tablet Take 1 tablet (40 mg total) by mouth 2 (two) times daily. 05/25/16   [name], MD  quinapril (ACCUPRIL) 20 MG tablet Take 1 tablet (20 mg total) by mouth daily. 05/25/16   [name], MD    Physical Exam: Vitals:   06/19/16 0640 06/19/16 0645 06/19/16 0700 06/19/16 0750  BP:  110/61 (!) 98/55   Pulse:  78 76   Resp:  20 (!) 26   Temp:      TempSrc:      SpO2: 96% 94% 97% 100%     General:  Appears calm, Sitting up in bed is not uncomfortable, on BiPap Eyes:  PERRL, EOMI, normal lids, iris ENT:  grossly normal hearing, lips & tongue, mucous  membranes of his mouth are slightly dry but pink Neck:  no LAD, masses or thyromegaly Cardiovascular:  RRR, no m/r/g. 1+ lower extremity pitting edema Respiratory:  BiPAP in place. Moderate increased work of breathing with conversation. Breath sounds are quite diminished throughout.. Diffuse wheezing and crackles Abdomen:  Obese somewhat tight positive bowel sounds nontender no guarding or rebounding Skin:  no rash or induration seen on limited exam Musculoskeletal:  grossly normal tone BUE/BLE, good ROM, no bony abnormality Psychiatric:  grossly normal mood and affect, speech fluent and appropriate, AOx3 Neurologic:  CN 2-12 grossly intact, moves all extremities in coordinated fashion, sensation intact  Labs on Admission: I have personally reviewed following labs and imaging studies  CBC:  Recent Labs Lab 06/19/16 0645 06/19/16 0652  WBC 12.3*  --   NEUTROABS 8.0*  --   HGB 16.3 16.7  HCT 48.6 49.0  MCV 97.2  --   PLT 261  --    Basic Metabolic Panel:  Recent Labs Lab 06/19/16 0652  NA 138  K 4.6  CL 103  GLUCOSE 358*  BUN 9  CREATININE 1.10   GFR: CrCl cannot be calculated (Unknown ideal weight.). Liver Function Tests: No results for input(s): AST, ALT, ALKPHOS, BILITOT, PROT, ALBUMIN in the last 168 hours. No results for input(s): LIPASE, AMYLASE in the last 168 hours. No results for input(s): AMMONIA in the last 168 hours. Coagulation Profile: No results for input(s): INR, PROTIME in the last 168 hours. Cardiac Enzymes:  Recent Labs Lab 06/19/16 0645  TROPONINI <0.03   BNP (last 3 results) No results for input(s): PROBNP in the last 8760 hours. HbA1C: No results for input(s): HGBA1C in the last 72 hours. CBG: No results for input(s): GLUCAP in the last 168 hours. Lipid Profile: No results for input(s): CHOL, HDL, LDLCALC, TRIG, CHOLHDL, LDLDIRECT in the last 72 hours. Thyroid Function Tests: No results for input(s): TSH, T4TOTAL, FREET4, T3FREE,  THYROIDAB in the last 72 hours. Anemia Panel: No results for input(s): VITAMINB12, FOLATE, FERRITIN, TIBC, IRON, RETICCTPCT in the last 72 hours. Urine analysis:    Component Value Date/Time   COLORURINE YELLOW 09/09/2014 1003   APPEARANCEUR CLOUDY (A) 09/09/2014 1003   LABSPEC 1.017 09/09/2014 1003   PHURINE 5.5 09/09/2014 1003   GLUCOSEU NEGATIVE 09/09/2014 1003   HGBUR NEGATIVE 09/09/2014  [place] 09/09/2014 1003   [place] 09/09/2014 1003   PROTEINUR NEGATIVE 09/09/2014 1003   UROBILINOGEN 1.0 09/09/2014 1003   NITRITE POSITIVE (A) 09/09/2014 1003   LEUKOCYTESUR SMALL (A) 09/09/2014 1003    Creatinine Clearance: CrCl cannot be calculated (Unknown ideal weight.).  Sepsis Labs: @LABRCNTIP (procalcitonin:4,lacticidven:4) )No results found for this or any previous visit (from the past 240 hour(s)).   Radiological Exams on Admission: Dg Chest Portable 1 View  Result Date: 06/19/2016 CLINICAL DATA:  Shortness of breath.  Wheezing.  History of CHF. EXAM: PORTABLE CHEST 1 VIEW COMPARISON:  09/09/2014 FINDINGS: Cardiac enlargement with mild pulmonary vascular congestion. Small bilateral pleural effusions with basilar interstitial edema. There is asymmetric increased opacity in the right lung base which may indicate developing asymmetric edema or superimposed pneumonia. No pneumothorax. Calcification of the aorta. Old right rib fractures. IMPRESSION: Cardiac enlargement with pulmonary vascular congestion and mild interstitial edema. Increasing opacity in the right lung base may indicate developing asymmetric edema or superimposed pneumonia. Electronically Signed   By: Lucienne Capers M.D.   On: 06/19/2016 06:52    EKG: Independently reviewed. Sinus rhythm Nonspecific intraventricular conduction delay Anterior infarct, old Borderline ST depression, lateral leads No significant change since last tracing  Assessment/Plan Principal Problem:   Acute respiratory  failure (HCC) Active Problems:   CAD (coronary artery disease)   DM type 2 (diabetes mellitus, type 2) (HCC)   Morbid obesity (HCC)   HTN (hypertension)   Acute on chronic systolic heart failure (HCC)   Hyperlipidemia   Peptic ulcer disease   Elevated lactic acid level    #1. Acute respiratory failure with hypoxia likely related to acute on chronic systolic heart failure. Chest x-ray reveals cardiac enlargement with pulmonary vascular congestion and interstitial edema as well as increasing opacity of the right lung base may indicate asymmetrical edema or superimposed pneumonia. Patient on BiPAP at the point of admission. EKG with SR no acute changes. He's afebrile with a mild leukocytosis nontoxic appearing. He is provided with 40 mg Lasix IV in the emergency department. -Admit to step down -Continue BiPAP -IV Lasix 40 mg every 6 hours with parameters -Wean BiPAP as able -Echocardiogram -follow blood culture -Daily weights -Intake and output -Holding home ACE inhibitor and beta blocker do to soft blood pressure -Cardiology consult -repeat chest xray in am  #2. Acute on chronic systolic heart failure. Etiology unclear. Patient reports compliance with his medication. He denies any gradual worsening of edema or dyspnea with exertion. Chart review indicates echo 2016 with an EF of 30-35% with mild MR. BNP 339. Chest x-ray as noted above. Home medications include Coreg, ACE inhibitor, Lasix 40 mg by mouth daily. Suspect having sleep apnea contributing -Lasix IV as noted above -Echocardiogram -Holding ACE inhibitor and beta blocker for now do to soft blood pressure -Monitor intake and output -Obtain daily weights -Cardiology consult -may benefit OP sleep study  #3. CAD. Chart review indicates known history CAD with past RCA stents and prior MI. Cardiac cath done 2016 revealing patent RCA stent with minimal restenosis and moderate calcified LAD disease per chart review. EF 40%. Chart  review indicates he is on Plavix without aspirin due to gastric ulcers. Last cardiology visit noted was May 2017. Initial troponin negative. Denies chest pain. EKG as noted above -Cycle troponin -echo -cardiology consult  #4. Diabetes. Serum glucose 358. He is on oral agents only. -Obtain a hemoglobin A1c -Hold oral agents for now -Sliding scale insulin for optimal  control  #5. Elevated lactic acid. Likely related to above. Lactic acid 3.9 on admission. Patient is afebrile with a mild leukocytosis nontoxic appearing with a soft blood pressure. Chest x-ray as noted above. no Fluids at this point secondary to #2. -See above -Cycle lactic acid -if trending up consider antibiotics for cap -monitor closely  6. Hypertension. Pressure soft on admission. Home medications include Coreg, Lasix, quinapril. -IV Lasix as noted above -Hold Coreg and quinapril for now secondary to soft blood pressure -Monitor closely  #7. Morbid obesity. 254 lbs on admission.  -nutritional consult   DVT prophylaxis: lovenox  Code Status: full  Family Communication: daughter son in law granddaughter at bedside  Disposition Plan: home  Consults called: cardmaster  Admission status: inpatient    Radene Gunning MD [facility]  If 7PM-7AM, please contact night-coverage [email or web address] Password [email or web address]  06/19/2016, 7:59 AM

## 2016-06-19 NOTE — Progress Notes (Signed)
Patient taken off of NIV and placed on 4 L [place] at this time. No distress noted. Patient states breathing comfortably. SpO2 of 96% and RR of 18. Will continue to monitor. Patient instructed to call if he feels short of breath and wishes to go back on NIV.

## 2016-06-20 ENCOUNTER — Inpatient Hospital Stay (HOSPITAL_COMMUNITY): Payer: PPO

## 2016-06-20 ENCOUNTER — Other Ambulatory Visit (HOSPITAL_COMMUNITY): Payer: PPO

## 2016-06-20 DIAGNOSIS — J9601 Acute respiratory failure with hypoxia: Secondary | ICD-10-CM

## 2016-06-20 DIAGNOSIS — I5023 Acute on chronic systolic (congestive) heart failure: Secondary | ICD-10-CM

## 2016-06-20 DIAGNOSIS — N390 Urinary tract infection, site not specified: Secondary | ICD-10-CM | POA: Diagnosis present

## 2016-06-20 DIAGNOSIS — I248 Other forms of acute ischemic heart disease: Secondary | ICD-10-CM

## 2016-06-20 DIAGNOSIS — R7989 Other specified abnormal findings of blood chemistry: Secondary | ICD-10-CM

## 2016-06-20 DIAGNOSIS — A419 Sepsis, unspecified organism: Secondary | ICD-10-CM | POA: Diagnosis present

## 2016-06-20 DIAGNOSIS — E1142 Type 2 diabetes mellitus with diabetic polyneuropathy: Secondary | ICD-10-CM

## 2016-06-20 LAB — GLUCOSE, CAPILLARY
GLUCOSE-CAPILLARY: 116 mg/dL — AB (ref 65–99)
GLUCOSE-CAPILLARY: 285 mg/dL — AB (ref 65–99)
GLUCOSE-CAPILLARY: 158 mg/dL — AB (ref 65–99)
Glucose-Capillary: 180 mg/dL — ABNORMAL HIGH (ref 65–99)

## 2016-06-20 LAB — CBC
HCT: 42.7 % (ref 39.0–52.0)
Hemoglobin: 14.4 g/dL (ref 13.0–17.0)
MCH: 32.4 pg (ref 26.0–34.0)
MCHC: 33.7 g/dL (ref 30.0–36.0)
MCV: 96 fL (ref 78.0–100.0)
PLATELETS: 197 10*3/uL (ref 150–400)
RBC: 4.45 MIL/uL (ref 4.22–5.81)
RDW: 14.1 % (ref 11.5–15.5)
WBC: 15.8 10*3/uL — AB (ref 4.0–10.5)

## 2016-06-20 LAB — COMPREHENSIVE METABOLIC PANEL
ALK PHOS: 80 U/L (ref 38–126)
ALT: 46 U/L (ref 17–63)
AST: 61 U/L — ABNORMAL HIGH (ref 15–41)
Albumin: 3.1 g/dL — ABNORMAL LOW (ref 3.5–5.0)
Anion gap: 12 (ref 5–15)
BILIRUBIN TOTAL: 1.1 mg/dL (ref 0.3–1.2)
BUN: 14 mg/dL (ref 6–20)
CALCIUM: 8.9 mg/dL (ref 8.9–10.3)
CO2: 26 mmol/L (ref 22–32)
CREATININE: 1.1 mg/dL (ref 0.61–1.24)
Chloride: 103 mmol/L (ref 101–111)
Glucose, Bld: 135 mg/dL — ABNORMAL HIGH (ref 65–99)
Potassium: 3.6 mmol/L (ref 3.5–5.1)
Sodium: 141 mmol/L (ref 135–145)
Total Protein: 6.2 g/dL — ABNORMAL LOW (ref 6.5–8.1)

## 2016-06-20 LAB — HEMOGLOBIN A1C
Hgb A1c MFr Bld: 7.4 % — ABNORMAL HIGH (ref 4.8–5.6)
MEAN PLASMA GLUCOSE: 166 mg/dL

## 2016-06-20 MED ORDER — POTASSIUM CHLORIDE CRYS ER 20 MEQ PO TBCR
20.0000 meq | EXTENDED_RELEASE_TABLET | Freq: Once | ORAL | Status: AC
  Administered 2016-06-20: 20 meq via ORAL
  Filled 2016-06-20: qty 1

## 2016-06-20 MED ORDER — ENOXAPARIN SODIUM 40 MG/0.4ML ~~LOC~~ SOLN
40.0000 mg | SUBCUTANEOUS | Status: DC
  Administered 2016-06-20 – 2016-06-22 (×3): 40 mg via SUBCUTANEOUS
  Filled 2016-06-20 (×3): qty 0.4

## 2016-06-20 MED ORDER — DEXTROSE 5 % IV SOLN
1.0000 g | INTRAVENOUS | Status: DC
  Administered 2016-06-21: 1 g via INTRAVENOUS
  Filled 2016-06-20: qty 10

## 2016-06-20 NOTE — Progress Notes (Signed)
PROGRESS NOTE  Devin Hanna  BPZ:[id number] DOB: [birth year] DOA: 06/19/2016 PCP: [name], MD   Brief Narrative: Devin Hanna is a pleasant 66 y.o. male with a history of STEMI s/p RCA rotational atherectomy and stenting, chronic systolic CHF (EF 24% by cath 2016), T2DM, HTN, HLD, and obesity (BMI 36) who presented for dyspnea found to be hypoxic requiring bagging by EMS and BiPAP which was quickly weaned in ED. BNP was elevated at 340 and he was admitted for acute respiratory failure likely related to acute on chronic systolic heart failure.  Assessment & Plan: Principal Problem:   Acute respiratory failure (HCC) Active Problems:   CAD (coronary artery disease)   DM type 2 (diabetes mellitus, type 2) ([place])   Morbid obesity ([place])   HTN (hypertension)   Acute on chronic systolic congestive heart failure (HCC)   Hyperlipidemia   Peptic ulcer disease   Elevated lactic acid level   Demand ischemia (HCC)  Acute respiratory failure with hypoxia: Acute CHF +/- CAP: CXR with cardiomegaly, PVC, interstitial edema and possibly asymmetric edema vs. infiltrate.  - Transfer to telemetry. Briefly required BiPAP, has been stable off BiPAP x24 hours.  - Treat as below  Sepsis: Meets definition with hypothermia, leukocytosis, tachypnea on admission. Also with hypotension and lactic acid elevation. Source unclear, possibly urinary. Pulmonary less likely as infiltrates resolved with diuresis. - Continue ceftriaxone targeting UTI - Monitor urine cultures, unfortunately blood cultures were not drawn and he's received abx for ~36 hrs. - No fluids for lactic acid elevation due to overload. Recheck lactate in AM  Acute on chronic systolic heart failure: Echo 2016 with an EF of 30-35% with mild MR. BNP 339. Home medications include Coreg, ACE inhibitor, Lasix 40 mg by mouth daily. Suspect having sleep apnea contributing - Cardiology consulted - Continue IV diuresis.  - Echocardiogram pending -  Strict I/O, daily weights - Holding ACE inhibitor and beta blocker with hypotension. Will restart at low doses as able.  CAD s/p STEMI without chest pain in 2016 treated with RCA atherectomy and stenting, last cath showed minimal restenosis, moderate LAD disease. Followed by Dr. Fletcher Anon. Troponin mildly elevated (peak at 0.13), consistent with demand ischemia without ECG changes.  - Continue plavix (no ASA due to PUD),  - Follow up wall motion on echo: Considering left and right heart cath if progressive LV dysfunction noted.  NIDDM: Hyperglycemic on admission without gap or acidosis ?related to infection. HbA1c 7.4% - Hold oral agents for now - SSI.   DVT prophylaxis: Lovenox Code Status: Full Family Communication: None at bedside this AM Disposition Plan: Anticipate DC home once diuresed, 24 - 48 hours.  Consultants:   Cardiology, Dr. Wynonia Lawman  Procedures:   Echocardiogram ordered  Antimicrobials:  Vancomycin, cefepime   Subjective: Dyspnea improved, no chest pain currently. Still not breathing at baseline. Legs with swelling stable.   Objective: Vitals:   06/19/16 2031 06/20/16 0450 06/20/16 1339 06/20/16 1500  BP: 99/62 (!) 106/43 (!) 105/46   Pulse: 73 63    Resp:  13    Temp: 98.8 F (37.1 C) 98.3 F (36.8 C)  99.1 F (37.3 C)  TempSrc: Oral Oral  Oral  SpO2: 92% 98%  98%  Weight:  110 kg (242 lb 6.4 oz)      Intake/Output Summary (Last 24 hours) at 06/20/16 1731 Last data filed at 06/20/16 1624  Gross per 24 hour  Intake  240 ml  Output             3800 ml  Net            -3560 ml   Filed Weights   06/20/16 0450  Weight: 110 kg (242 lb 6.4 oz)    Examination: General exam: 65 y.o. male in no distress  Respiratory system: Non-labored, tachypneic, breathing 4L by [place]. Clear to auscultation bilaterally.  Cardiovascular system: Regular rate and rhythm. No murmur, rub, or gallop. No JVD, and 1+ pedal edema. Gastrointestinal system: Abdomen soft,  non-tender, non-distended, with normoactive bowel sounds. No organomegaly or masses felt. Central nervous system: Alert and oriented. No focal neurological deficits. Extremities: Warm, no deformities Skin: No rashes, lesions no ulcers Psychiatry: Judgement and insight appear normal. Mood & affect appropriate.   Data Reviewed: I have personally reviewed following labs and imaging studies  CBC:  Recent Labs Lab 06/19/16 0645 06/19/16 0652 06/20/16 0509  WBC 12.3*  --  15.8*  NEUTROABS 8.0*  --   --   HGB 16.3 16.7 14.4  HCT 48.6 49.0 42.7  MCV 97.2  --  96.0  PLT 261  --  244   Basic Metabolic Panel:  Recent Labs Lab 06/19/16 0652 06/19/16 0751 06/20/16 0509  NA 138  --  141  K 4.6  --  3.6  CL 103  --  103  CO2  --   --  26  GLUCOSE 358*  --  135*  BUN 9  --  14  CREATININE 1.10  --  1.10  CALCIUM  --   --  8.9  MG  --  1.8  --    GFR: Estimated Creatinine Clearance: 77.3 mL/min (by C-G formula based on SCr of 1.1 mg/dL). Liver Function Tests:  Recent Labs Lab 06/20/16 0509  AST 61*  ALT 46  ALKPHOS 80  BILITOT 1.1  PROT 6.2*  ALBUMIN 3.1*   No results for input(s): LIPASE, AMYLASE in the last 168 hours. No results for input(s): AMMONIA in the last 168 hours. Coagulation Profile: No results for input(s): INR, PROTIME in the last 168 hours. Cardiac Enzymes:  Recent Labs Lab 06/19/16 0645 06/19/16 0751 06/19/16 1150 06/19/16 1948  TROPONINI <0.03 0.06* 0.10* 0.13*   BNP (last 3 results) No results for input(s): PROBNP in the last 8760 hours. HbA1C:  Recent Labs  06/19/16 0754  HGBA1C 7.4*   CBG:  Recent Labs Lab 06/19/16 1636 06/19/16 2057 06/20/16 0723 06/20/16 1129 06/20/16 1623  GLUCAP 353* 212* 116* 180* 285*   Lipid Profile: No results for input(s): CHOL, HDL, LDLCALC, TRIG, CHOLHDL, LDLDIRECT in the last 72 hours. Thyroid Function Tests: No results for input(s): TSH, T4TOTAL, FREET4, T3FREE, THYROIDAB in the last 72  hours. Anemia Panel: No results for input(s): VITAMINB12, FOLATE, FERRITIN, TIBC, IRON, RETICCTPCT in the last 72 hours. Urine analysis:    Component Value Date/Time   COLORURINE YELLOW 06/19/2016 [place] 06/19/2016 1241   LABSPEC 1.011 06/19/2016 1241   PHURINE 6.0 06/19/2016 1241   GLUCOSEU >=500 (A) 06/19/2016 1241   HGBUR NEGATIVE 06/19/2016 1241   BILIRUBINUR NEGATIVE 06/19/2016 1241   KETONESUR 5 (A) 06/19/2016 1241   PROTEINUR NEGATIVE 06/19/2016 1241   UROBILINOGEN 1.0 09/09/2014 1003   NITRITE POSITIVE (A) 06/19/2016 1241   LEUKOCYTESUR NEGATIVE 06/19/2016 1241   Recent Results (from the past 240 hour(s))  MRSA PCR Screening     Status: None   Collection Time: 06/19/16 12:41 PM  Result Value Ref Range Status   MRSA by PCR NEGATIVE NEGATIVE Final    Comment:        The GeneXpert MRSA Assay (FDA approved for NASAL specimens only), is one component of a comprehensive MRSA colonization surveillance program. It is not intended to diagnose MRSA infection nor to guide or monitor treatment for MRSA infections.       Radiology Studies: Dg Chest Port 1 View  Result Date: 06/20/2016 CLINICAL DATA:  Shortness of breath EXAM: PORTABLE CHEST 1 VIEW COMPARISON:  Jun 19, 2016 FINDINGS: No pneumothorax. Mild cardiomegaly. Resolution of the right basilar opacity. No focal infiltrates remain. IMPRESSION: Resolution of focal infiltrates. Electronically Signed   By: Dorise Bullion III M.D   On: 06/20/2016 08:02   Dg Chest Portable 1 View  Result Date: 06/19/2016 CLINICAL DATA:  Shortness of breath.  Wheezing.  History of CHF. EXAM: PORTABLE CHEST 1 VIEW COMPARISON:  09/09/2014 FINDINGS: Cardiac enlargement with mild pulmonary vascular congestion. Small bilateral pleural effusions with basilar interstitial edema. There is asymmetric increased opacity in the right lung base which may indicate developing asymmetric edema or superimposed pneumonia. No pneumothorax.  Calcification of the aorta. Old right rib fractures. IMPRESSION: Cardiac enlargement with pulmonary vascular congestion and mild interstitial edema. Increasing opacity in the right lung base may indicate developing asymmetric edema or superimposed pneumonia. Electronically Signed   By: Lucienne Capers M.D.   On: 06/19/2016 06:52    Scheduled Meds: . albuterol  2.5 mg Nebulization TID  . atorvastatin  40 mg Oral q1800  . clopidogrel  75 mg Oral Daily  . furosemide  40 mg Intravenous BID  . guaiFENesin  600 mg Oral BID  . insulin aspart  0-15 Units Subcutaneous TID WC  . insulin aspart  0-5 Units Subcutaneous QHS  . sodium chloride flush  3 mL Intravenous Q12H   Continuous Infusions: . [START ON 06/21/2016] cefTRIAXone (ROCEPHIN)  IV       LOS: 1 day   Time spent: 25 minutes.  Vance Gather, MD [facility] Pager [phone]  If 7PM-7AM, please contact night-coverage [email or web address] Password [facility] 06/20/2016, 5:31 PM

## 2016-06-20 NOTE — Progress Notes (Signed)
Subjective:  His breathing is better today and he has diuresed significantly overnight.  Renal function is remaining stable.  Objective:  Vital Signs in the last 24 hours: BP (!) 106/43 (BP Location: Left Arm)   Pulse 63   Temp 98.3 F (36.8 C) (Oral)   Resp 13   Wt 110 kg (242 lb 6.4 oz)   SpO2 98%   BMI 36.86 kg/m   Physical Exam: Pleasant obese male in no acute distress Lungs:  Clear Cardiac:  Regular rhythm, normal S1 and S2, no S3 Extremities:  1+ edema present  Intake/Output from previous day: 05/11 0701 - 05/12 0700 In: 1360 [P.O.:960; I.V.:400] Out: 3300 [Urine:3300]  Weight Filed Weights   06/20/16 0450  Weight: 110 kg (242 lb 6.4 oz)    Lab Results: Basic Metabolic Panel:  Recent Labs  06/19/16 0652 06/20/16 0509  NA 138 141  K 4.6 3.6  CL 103 103  CO2  --  26  GLUCOSE 358* 135*  BUN 9 14  CREATININE 1.10 1.10   CBC:  Recent Labs  06/19/16 0645 06/19/16 0652 06/20/16 0509  WBC 12.3*  --  15.8*  NEUTROABS 8.0*  --   --   HGB 16.3 16.7 14.4  HCT 48.6 49.0 42.7  MCV 97.2  --  96.0  PLT 261  --  197   Cardiac Enzymes: Troponin (Point of Care Test)  Recent Labs  06/19/16 0643  TROPIPOC 0.00   Cardiac Panel (last 3 results)  Recent Labs  06/19/16 0751 06/19/16 1150 06/19/16 1948  TROPONINI 0.06* 0.10* 0.13*    Telemetry: Sinus rhythm with occasional PVCs  Assessment/Plan:  1.  Acute on chronic systolic congestive heart failure improving 2.  Possible pneumonia 3.  Poorly controlled diabetes 4.  Obesity 5.  CAD with previous stenting reportedly patent at catheterization 2 years ago  Recommendations:  Continue diuresis and await results of echocardiogram.  Decide on whether he will need repeat catheterization.  Does have mild troponin elevation.  Repeat EKG today.     Kerry Hough  MD [facility]  06/20/2016, 11:58 AM

## 2016-06-21 ENCOUNTER — Inpatient Hospital Stay (HOSPITAL_COMMUNITY): Payer: PPO

## 2016-06-21 DIAGNOSIS — I340 Nonrheumatic mitral (valve) insufficiency: Secondary | ICD-10-CM

## 2016-06-21 LAB — URINE CULTURE: Culture: <10000 — AB

## 2016-06-21 LAB — CBC
HEMATOCRIT: 44.3 % (ref 39.0–52.0)
Hemoglobin: 14.8 g/dL (ref 13.0–17.0)
MCH: 32.5 pg (ref 26.0–34.0)
MCHC: 33.4 g/dL (ref 30.0–36.0)
MCV: 97.4 fL (ref 78.0–100.0)
PLATELETS: 188 10*3/uL (ref 150–400)
RBC: 4.55 MIL/uL (ref 4.22–5.81)
RDW: 14.3 % (ref 11.5–15.5)
WBC: 9.3 10*3/uL (ref 4.0–10.5)

## 2016-06-21 LAB — GLUCOSE, CAPILLARY
GLUCOSE-CAPILLARY: 223 mg/dL — AB (ref 65–99)
GLUCOSE-CAPILLARY: 221 mg/dL — AB (ref 65–99)
Glucose-Capillary: 198 mg/dL — ABNORMAL HIGH (ref 65–99)
Glucose-Capillary: 188 mg/dL — ABNORMAL HIGH (ref 65–99)

## 2016-06-21 LAB — LACTIC ACID, PLASMA: Lactic Acid, Venous: 2.5 mmol/L (ref 0.5–1.9)

## 2016-06-21 LAB — BASIC METABOLIC PANEL
Anion gap: 13 (ref 5–15)
BUN: 17 mg/dL (ref 6–20)
CHLORIDE: 102 mmol/L (ref 101–111)
CO2: 23 mmol/L (ref 22–32)
CREATININE: 1.05 mg/dL (ref 0.61–1.24)
Calcium: 8.6 mg/dL — ABNORMAL LOW (ref 8.9–10.3)
Glucose, Bld: 186 mg/dL — ABNORMAL HIGH (ref 65–99)
POTASSIUM: 4.7 mmol/L (ref 3.5–5.1)
SODIUM: 138 mmol/L (ref 135–145)

## 2016-06-21 LAB — ECHOCARDIOGRAM COMPLETE: Weight: 3734.4 oz

## 2016-06-21 MED ORDER — QUINAPRIL HCL 10 MG PO TABS
20.0000 mg | ORAL_TABLET | Freq: Every day | ORAL | Status: DC
  Administered 2016-06-21 – 2016-06-23 (×3): 20 mg via ORAL
  Filled 2016-06-21 (×3): qty 2

## 2016-06-21 NOTE — Progress Notes (Signed)
  Echocardiogram 2D Echocardiogram has been performed.  Donalee Gaumond T Dannon Perlow 06/21/2016, 9:49 AM

## 2016-06-21 NOTE — Progress Notes (Signed)
O2 sats dropped to 86% while sleeping, placed on 2L O2. When awake, 96% on RA.

## 2016-06-21 NOTE — Progress Notes (Signed)
PROGRESS NOTE  Devin Hanna  AYT:[id number] DOB: Sep 29, 1950 DOA: 06/19/2016 PCP: [name], MD   Brief Narrative: Devin Hanna is a pleasant 66 y.o. 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 BNP was elevated at 340 and he was admitted for acute respiratory failure likely related to acute on chronic systolic heart failure.  Assessment & Plan: Principal Problem:   Acute respiratory failure with hypoxia (HCC) Active Problems:   CAD (coronary artery disease)   DM type 2 (diabetes mellitus, type 2) (HCC)   Morbid obesity (HCC)   HTN (hypertension)   Acute on chronic systolic congestive heart failure (HCC)   Hyperlipidemia   Peptic ulcer disease   Elevated lactic acid level   Demand ischemia (HCC)   Acute lower UTI   Sepsis ([place])  Acute respiratory failure with hypoxia: Acute CHF +/- CAP: CXR with cardiomegaly, PVC, interstitial edema and possibly asymmetric edema vs. infiltrate.  - Briefly required BiPAP, has been stable off BiPAP x24 hours. Wean oxygen as able. - Treat as below  Sepsis ruled out: Met definition with hypothermia, leukocytosis, tachypnea on admission. Also with hypotension and lactic acid elevation. Doubtful of UTI as urine culture was insignificant growth. Pulmonary less likely as infiltrates resolved overnight with diuresis, as did leukocytosis. Hypothermia was 96.48F by temporal measurement with no subsequent hypothermia or fever. Blood cultures were not drawn.  - Received ceftriaxone 5/11-5/13 - Lactate improving, suspect due to cardiogenic hypoperfusion. - DC abx and monitor. Blood cultures to be drawn for any fever.   Acute on chronic systolic heart failure: Echo 2016 with an EF of 30-35% with mild MR > repeat as below. BNP 339. Home  medications include Coreg, ACE inhibitor, Lasix 40 mg by mouth daily.  - Cardiology consulting, NPO p MN for possible cath.  - Continue lasix 40m IV BID, respiratory failure improving. - Echocardiogram shows EF 40%, diffuse hypokinesis with severe hypokinesis of inferior myocardium, G1DD.  - Strict I/O, daily weights; down 9lbs and creatinine holding.  - Restarted low dose ACE inhibitor, holding beta blocker due to low BP. Will restart at low doses as able.  CAD s/p STEMI without chest pain in 2016 treated with RCA atherectomy and stenting, last cath showed minimal restenosis, moderate LAD disease. Followed by Dr. [name] Troponin mildly elevated (peak at 0.13), consistent with demand ischemia without ECG changes.  - Continue plavix (no ASA due to PUD),  - Follow up wall motion on echo: Considering left and right heart cath if progressive LV dysfunction noted.  NIDDM: Hyperglycemic on admission without gap or acidosis ?related to infection. HbA1c 7.4% - Hold oral agents for now - SSI.   DVT prophylaxis: Lovenox Code Status: Full Family Communication: None at bedside this AM [place]position Plan: [facility] once diuresed, pending ischemic work up, possibly 24 - 48 hours.  Consultants:   Cardiology, Dr. TWynonia Lawman Procedures:   Echocardiogram 06/21/2016: - Left ventricle: Systolic function was moderately reduced. The   estimated ejection fraction was 40%. Diffuse hypokinesis. Doppler   parameters are consistent with abnormal left ventricular   relaxation (grade 1 diastolic dysfunction). Doppler parameters   are consistent with high ventricular filling pressure. - Regional wall motion abnormality: Severe hypokinesis of the   entire inferior myocardium. -  Mitral valve: Moderately calcified annulus. Mildly thickened   leaflets. There was mild regurgitation. - Left atrium: The atrium was mildly dilated. - Right ventricle: The cavity size was mildly  dilated.  Antimicrobials:  Vancomycin, cefepime  Ceftriaxone 5/11 - 5/13  Subjective: Dyspnea improved, no chest pain currently. Swelling improved.   Objective: Vitals:   06/20/16 1935 06/21/16 0500 06/21/16 0953 06/21/16 1524  BP: 126/68 125/68 (!) 126/54 103/68  Pulse: 71 (!) 59 71 69  Resp: _0 Temp: 98.3 F (36.8 C) 98.6 F (37 C)  98.1 F (36.7 C)  TempSrc: Oral Oral  Oral  SpO2: 94% 98% 90% 97%  Weight:  105.9 kg (233 lb 6.4 oz)      Intake/Output Summary (Last 24 hours) at 06/21/16 1604 Last data filed at 06/21/16 0656  Gross per 24 hour  Intake              240 ml  Output             2500 ml  Net            -2260 ml   Filed Weights   06/20/16 0450 06/21/16 0500  Weight: 110 kg (242 lb 6.4 oz) 105.9 kg (233 lb 6.4 oz)    Examination: General exam: 66 y.o. male in no distress  Respiratory system: Non-labored. Clear to auscultation bilaterally.  Cardiovascular system: Regular rate and rhythm. No murmur, rub, or gallop. No JVD, and trace pedal edema. Gastrointestinal system: Abdomen soft, non-tender, non-distended, with normoactive bowel sounds. No organomegaly or masses felt. Central nervous system: Alert and oriented. No focal neurological deficits. Extremities: Warm, no deformities Skin: No rashes, lesions no ulcers Psychiatry: Judgement and insight appear normal. Mood & affect appropriate.   Data Reviewed: I have personally reviewed following labs and imaging studies  CBC:  Recent Labs Lab 06/19/16 0645 06/19/16 0652 06/20/16 0509 06/21/16 0704  WBC 12.3*  --  15.8* 9.3  NEUTROABS 8.0*  --   --   --   HGB 16.3 16.7 14.4 14.8  HCT 48.6 49.0 42.7 44.3  MCV 97.2  --  96.0 97.4  PLT 261  --  197 546   Basic Metabolic Panel:  Recent Labs Lab 06/19/16 0652 06/19/16 0751 06/20/16 0509 06/21/16 0704  NA 138  --  141 138  K 4.6  --  3.6 4.7  CL 103  --  103 102  CO2  --   --  26 23  GLUCOSE 358*  --  135* 186*  BUN 9  --  14 17   CREATININE 1.10  --  1.10 1.05  CALCIUM  --   --  8.9 8.6*  MG  --  1.8  --   --    GFR: Estimated Creatinine Clearance: 79.4 mL/min (by C-G formula based on SCr of 1.05 mg/dL). Liver Function Tests:  Recent Labs Lab 06/20/16 0509  AST 61*  ALT 46  ALKPHOS 80  BILITOT 1.1  PROT 6.2*  ALBUMIN 3.1*   No results for input(s): LIPASE, AMYLASE in the last 168 hours. No results for input(s): AMMONIA in the last 168 hours. Coagulation Profile: No results for input(s): INR, PROTIME in the last 168 hours. Cardiac Enzymes:  Recent Labs Lab 06/19/16 0645 06/19/16 0751 06/19/16 1150 06/19/16 1948  TROPONINI <0.03 0.06* 0.10* 0.13*   BNP (last 3 results) No results for input(s): PROBNP in the last 8760 hours. HbA1C:  Recent Labs  06/19/16 0754  HGBA1C 7.4*  CBG:  Recent Labs Lab 06/20/16 1129 06/20/16 1623 06/20/16 2133 06/21/16 0745 06/21/16 1136  GLUCAP 180* 285* 158* 223* 221*   Lipid Profile: No results for input(s): CHOL, HDL, LDLCALC, TRIG, CHOLHDL, LDLDIRECT in the last 72 hours. Thyroid Function Tests: No results for input(s): TSH, T4TOTAL, FREET4, T3FREE, THYROIDAB in the last 72 hours. Anemia Panel: No results for input(s): VITAMINB12, FOLATE, FERRITIN, TIBC, IRON, RETICCTPCT in the last 72 hours. Urine analysis:    Component Value Date/Time   COLORURINE YELLOW 06/19/2016 [place] 06/19/2016 1241   LABSPEC 1.011 06/19/2016 1241   PHURINE 6.0 06/19/2016 1241   GLUCOSEU >=500 (A) 06/19/2016 1241   HGBUR NEGATIVE 06/19/2016 1241   BILIRUBINUR NEGATIVE 06/19/2016 1241   KETONESUR 5 (A) 06/19/2016 1241   PROTEINUR NEGATIVE 06/19/2016 1241   UROBILINOGEN 1.0 09/09/2014 1003   NITRITE POSITIVE (A) 06/19/2016 1241   LEUKOCYTESUR NEGATIVE 06/19/2016 1241   Recent Results (from the past 240 hour(s))  MRSA PCR Screening     Status: None   Collection Time: 06/19/16 12:41 PM  Result Value Ref Range Status   MRSA by PCR NEGATIVE  NEGATIVE Final    Comment:        The GeneXpert MRSA Assay (FDA approved for NASAL specimens only), is one component of a comprehensive MRSA colonization surveillance program. It is not intended to diagnose MRSA infection nor to guide or monitor treatment for MRSA infections.   Culture, Urine     Status: Abnormal   Collection Time: 06/19/16 12:44 PM  Result Value Ref Range Status   Specimen Description URINE, RANDOM  Final   Special Requests NONE  Final   Culture <10,000 COLONIES/mL INSIGNIFICANT GROWTH (A)  Final   Report Status 06/21/2016 FINAL  Final      Radiology Studies: Dg Chest Port 1 View  Result Date: 06/20/2016 CLINICAL DATA:  Shortness of breath EXAM: PORTABLE CHEST 1 VIEW COMPARISON:  Jun 19, 2016 FINDINGS: No pneumothorax. Mild cardiomegaly. Resolution of the right basilar opacity. No focal infiltrates remain. IMPRESSION: Resolution of focal infiltrates. Electronically Signed   By: Dorise Bullion III M.D   On: 06/20/2016 08:02    Scheduled Meds: . atorvastatin  40 mg Oral q1800  . clopidogrel  75 mg Oral Daily  . enoxaparin (LOVENOX) injection  40 mg Subcutaneous Q24H  . furosemide  40 mg Intravenous BID  . guaiFENesin  600 mg Oral BID  . insulin aspart  0-15 Units Subcutaneous TID WC  . insulin aspart  0-5 Units Subcutaneous QHS  . quinapril  20 mg Oral Daily  . sodium chloride flush  3 mL Intravenous Q12H   Continuous Infusions: . cefTRIAXone (ROCEPHIN)  IV Stopped (06/21/16 1022)     LOS: 2 days   Time spent: 25 minutes.  Vance Gather, MD [facility] Pager [phone]  If 7PM-7AM, please contact night-coverage [email or web address] Password [facility] 06/21/2016, 4:04 PM

## 2016-06-21 NOTE — Progress Notes (Signed)
Patient resting comfortably on room air at this time and without respiratory distress. BIPAP not indicated at this time. RT will continue to monitor as needed.

## 2016-06-21 NOTE — Progress Notes (Signed)
Subjective:  Breathing currently better but had some dyspnea earlier today.  Weight down around 9 pounds.  Echocardiogram done earlier today and will be reviewed.  Currently being treated for pneumonia with ceftriaxone.  Troponin has mildly increased but no significant chest pain at this time.  Objective:  Vital Signs in the last 24 hours: BP (!) 126/54   Pulse 71   Temp 98.6 F (37 C) (Oral)   Resp 15   Wt 105.9 kg (233 lb 6.4 oz)   SpO2 90%   BMI 35.49 kg/m   Physical Exam: Pleasant obese male in no acute distress Lungs:  Clear Cardiac:  Regular rhythm, normal S1 and S2, no S3 Extremities:  NO edema present  Intake/Output from previous day: 05/12 0701 - 05/13 0700 In: 240 [P.O.:240] Out: 3950 [Urine:3950]  Weight Filed Weights   06/20/16 0450 06/21/16 0500  Weight: 110 kg (242 lb 6.4 oz) 105.9 kg (233 lb 6.4 oz)    Lab Results: Basic Metabolic Panel:  Recent Labs  06/20/16 0509 06/21/16 0704  NA 141 138  K 3.6 4.7  CL 103 102  CO2 26 23  GLUCOSE 135* 186*  BUN 14 17  CREATININE 1.10 1.05   CBC:  Recent Labs  06/19/16 0645  06/20/16 0509 06/21/16 0704  WBC 12.3*  --  15.8* 9.3  NEUTROABS 8.0*  --   --   --   HGB 16.3  < > 14.4 14.8  HCT 48.6  < > 42.7 44.3  MCV 97.2  --  96.0 97.4  PLT 261  --  197 188  < > = values in this interval not displayed. Cardiac Enzymes: Troponin [facility] Test)  Recent Labs  06/19/16 0643  TROPIPOC 0.00   Cardiac Panel (last 3 results)  Recent Labs  06/19/16 0751 06/19/16 1150 06/19/16 1948  TROPONINI 0.06* 0.10* 0.13*    Telemetry: Sinus rhythm with occasional PVCs  Assessment/Plan:  1.  Acute on chronic systolic congestive heart failure improvingWith good diuresis 2.  Possible pneumonia 3.  Poorly controlled diabetes 4.  Obesity 5.  CAD with previous stenting reportedly patent at catheterization 2 years ago  Recommendations:  Await results of echocardiogram.  Troponin elevation may be due  to congestive heart failure or could be demand ischemia.  May need repeat catheterization but will see what echo.  Keep nothing by mouth after midnight to leave this option open.     Kerry Hough  MD [facility]  06/21/2016, 10:22 AM

## 2016-06-22 ENCOUNTER — Encounter (HOSPITAL_COMMUNITY): Payer: Self-pay | Admitting: General Practice

## 2016-06-22 ENCOUNTER — Inpatient Hospital Stay (HOSPITAL_COMMUNITY): Payer: PPO

## 2016-06-22 DIAGNOSIS — I340 Nonrheumatic mitral (valve) insufficiency: Secondary | ICD-10-CM

## 2016-06-22 DIAGNOSIS — I10 Essential (primary) hypertension: Secondary | ICD-10-CM

## 2016-06-22 LAB — BASIC METABOLIC PANEL
Anion gap: 13 (ref 5–15)
BUN: 15 mg/dL (ref 6–20)
CALCIUM: 8.9 mg/dL (ref 8.9–10.3)
CHLORIDE: 98 mmol/L — AB (ref 101–111)
CO2: 27 mmol/L (ref 22–32)
CREATININE: 1 mg/dL (ref 0.61–1.24)
GFR calc Af Amer: >60 mL/min (ref >60)
GFR calc non Af Amer: >60 mL/min (ref >60)
Glucose, Bld: 207 mg/dL — ABNORMAL HIGH (ref 65–99)
Potassium: 3.8 mmol/L (ref 3.5–5.1)
Sodium: 138 mmol/L (ref 135–145)

## 2016-06-22 LAB — ECHOCARDIOGRAM LIMITED: WEIGHTICAEL: 3734.4 [oz_av]

## 2016-06-22 LAB — GLUCOSE, CAPILLARY
GLUCOSE-CAPILLARY: 231 mg/dL — AB (ref 65–99)
Glucose-Capillary: 215 mg/dL — ABNORMAL HIGH (ref 65–99)
Glucose-Capillary: 231 mg/dL — ABNORMAL HIGH (ref 65–99)
Glucose-Capillary: 204 mg/dL — ABNORMAL HIGH (ref 65–99)

## 2016-06-22 MED ORDER — PERFLUTREN LIPID MICROSPHERE
INTRAVENOUS | Status: AC
  Administered 2016-06-22: 4 mL
  Filled 2016-06-22: qty 10

## 2016-06-22 MED ORDER — CARVEDILOL 6.25 MG PO TABS
6.2500 mg | ORAL_TABLET | Freq: Two times a day (BID) | ORAL | Status: DC
  Administered 2016-06-22 – 2016-06-23 (×2): 6.25 mg via ORAL
  Filled 2016-06-22 (×3): qty 1

## 2016-06-22 MED ORDER — PERFLUTREN LIPID MICROSPHERE
1.0000 mL | INTRAVENOUS | Status: AC | PRN
  Filled 2016-06-22: qty 10

## 2016-06-22 MED ORDER — SPIRONOLACTONE 25 MG PO TABS
12.5000 mg | ORAL_TABLET | Freq: Every day | ORAL | Status: DC
  Administered 2016-06-22 – 2016-06-23 (×2): 12.5 mg via ORAL
  Filled 2016-06-22 (×2): qty 1

## 2016-06-22 MED ORDER — FUROSEMIDE 40 MG PO TABS
40.0000 mg | ORAL_TABLET | Freq: Every day | ORAL | Status: DC
  Administered 2016-06-22 – 2016-06-23 (×2): 40 mg via ORAL
  Filled 2016-06-22 (×2): qty 1

## 2016-06-22 MED ORDER — INSULIN ASPART 100 UNIT/ML ~~LOC~~ SOLN
0.0000 [IU] | Freq: Three times a day (TID) | SUBCUTANEOUS | Status: DC
  Administered 2016-06-22 – 2016-06-23 (×2): 7 [IU] via SUBCUTANEOUS

## 2016-06-22 NOTE — Progress Notes (Signed)
  Echocardiogram 2D Echocardiogram has been performed.  Hawley Michel T Joshawn Crissman 06/22/2016, 3:28 PM

## 2016-06-22 NOTE — Progress Notes (Signed)
PROGRESS NOTE  Devin Hanna  ZOX:[id number] DOB: [birth year] DOA: 06/19/2016 PCP: [name], MD   Brief Narrative: Devin Hanna is a pleasant 66 y.o. 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 BNP was elevated at 340 and he was admitted for acute respiratory failure likely related to acute on chronic systolic heart failure. With diuresis, symptoms resolved. Echocardiogram was performed without definity and could not rule out apical thrombus, so plan is to repeat limited echo with definity prior to discharge.   Assessment & Plan: Principal Problem:   Acute respiratory failure with hypoxia (HCC) Active Problems:   CAD (coronary artery disease)   DM type 2 (diabetes mellitus, type 2) (HCC)   Morbid obesity (Devin Hanna)   HTN (hypertension)   Acute on chronic systolic congestive heart failure (HCC)   Hyperlipidemia   Peptic ulcer disease   Elevated lactic acid level   Demand ischemia (HCC)   Acute lower UTI   Sepsis (Devin Hanna)  Acute respiratory failure with hypoxia: Acute CHF +/- CAP: CXR with cardiomegaly, PVC, interstitial edema and possibly asymmetric edema vs. infiltrate.  - Briefly required BiPAP, has been stable off BiPAP, on room air since 5/13.  Sepsis ruled out: Met definition with hypothermia, leukocytosis, tachypnea on admission. Also with hypotension and lactic acid elevation. Doubtful of UTI as urine culture was insignificant growth. Pulmonary less likely as infiltrates resolved overnight with diuresis, as did leukocytosis. Hypothermia was 96.45F by temporal measurement with no subsequent hypothermia or fever. Blood cultures were not drawn.  - Received ceftriaxone 5/11-5/13 - Lactate improving, suspect due to cardiogenic hypoperfusion. - DC abx and monitor.  Blood cultures to be drawn for any fever.   Acute on chronic systolic heart failure: Echo 2016 with an EF of 30-35% with mild MR > repeat echocardiogram shows EF 40%, diffuse hypokinesis with severe hypokinesis of inferior myocardium, G1DD. BNP 339. Home medications include Coreg, ACE inhibitor, Lasix 40 mg by mouth daily.  - Cardiology consulting. Transitioned back to home po lasix 76m daily. - Strict I/O, daily weights; down 9lbs and creatinine holding (no wt today).  - Restarted low dose ACE inhibitor, and low dose beta blocker - Need to repeat echocardiogram with definity to r/o apical thrombus.   CAD s/p STEMI without chest pain in 2016 treated with RCA atherectomy and stenting, last cath showed minimal restenosis, moderate LAD disease. Followed by Dr. [name] Troponin mildly elevated (peak at 0.13), consistent with demand ischemia without ECG changes.  - Continue plavix (no ASA due to PUD),  - EF has not progressively worsened, so no ischemic work up is planned.   NIDDM: Hyperglycemic on admission without gap or acidosis ?related to infection. HbA1c 7.4% - Hold oral agents for now - SSI, augment to resistant scale.  DVT prophylaxis: Lovenox Code Status: Full Family Communication: None at bedside this AM Disposition Plan: Anticipate DC home in next 24 hours. Need to repeat echocardiogram with definity to r/o apical thrombus.   Consultants:   Cardiology  Procedures:   Echocardiogram 06/21/2016: - Left ventricle: Systolic function was moderately reduced. The   estimated ejection fraction was 40%. Diffuse hypokinesis. Doppler   parameters are consistent with abnormal left ventricular   relaxation (grade 1 diastolic dysfunction). Doppler parameters   are consistent with  high ventricular filling pressure. - Regional wall motion abnormality: Severe hypokinesis of the   entire inferior myocardium. - Mitral valve: Moderately calcified annulus. Mildly thickened   leaflets. There was  mild regurgitation. - Left atrium: The atrium was mildly dilated. - Right ventricle: The cavity size was mildly dilated.  Antimicrobials:  Vancomycin, cefepime  Ceftriaxone 5/11 - 5/13  Subjective: Dyspnea improved, no chest pain currently. Swelling improved. Wants to go home.   Objective: Vitals:   06/21/16 0953 06/21/16 1524 06/21/16 2033 06/22/16 0825  BP: (!) 126/54 103/68 132/71 (!) 142/61  Pulse: 71 69 70 65  Resp: _0 Temp:  98.1 F (36.7 C) 98.7 F (37.1 C)   TempSrc:  Oral    SpO2: 90% 97% 95% 97%  Weight:        Intake/Output Summary (Last 24 hours) at 06/22/16 1442 Last data filed at 06/22/16 0920  Gross per 24 hour  Intake              620 ml  Output             2050 ml  Net            -1430 ml   Filed Weights   06/20/16 0450 06/21/16 0500  Weight: 110 kg (242 lb 6.4 oz) 105.9 kg (233 lb 6.4 oz)    Examination: General exam: 66 y.o. male in no distress  Respiratory system: Non-labored. Clear to auscultation bilaterally.  Cardiovascular system: Regular rate and rhythm. No murmur, rub, or gallop. No JVD, and trace pedal edema. Gastrointestinal system: Abdomen soft, non-tender, non-distended, with normoactive bowel sounds. No organomegaly or masses felt. Central nervous system: Alert and oriented. No focal neurological deficits. Extremities: Warm, no deformities Skin: No rashes, lesions no ulcers Psychiatry: Judgement and insight appear normal. Mood & affect appropriate.   Data Reviewed: I have personally reviewed following labs and imaging studies  CBC:  Recent Labs Lab 06/19/16 0645 06/19/16 0652 06/20/16 0509 06/21/16 0704  WBC 12.3*  --  15.8* 9.3  NEUTROABS 8.0*  --   --   --   HGB 16.3 16.7 14.4 14.8  HCT 48.6 49.0 42.7 44.3  MCV 97.2  --  96.0 97.4  PLT 261  --  197 099   Basic Metabolic Panel:  Recent Labs Lab 06/19/16 0652 06/19/16 0751 06/20/16 0509 06/21/16 0704 06/22/16 0553  NA 138  --  141 138 138  K 4.6  --   3.6 4.7 3.8  CL 103  --  103 102 98*  CO2  --   --  _1 GLUCOSE 358*  --  135* 186* 207*  BUN 9  --  _2 CREATININE 1.10  --  1.10 1.05 1.00  CALCIUM  --   --  8.9 8.6* 8.9  MG  --  1.8  --   --   --    GFR: Estimated Creatinine Clearance: 83.4 mL/min (by C-G formula based on SCr of 1 mg/dL). Liver Function Tests:  Recent Labs Lab 06/20/16 0509  AST 61*  ALT 46  ALKPHOS 80  BILITOT 1.1  PROT 6.2*  ALBUMIN 3.1*   No results for input(s): LIPASE, AMYLASE in the last 168 hours. No results for input(s): AMMONIA in the last 168 hours. Coagulation Profile: No results for input(s): INR, PROTIME in the last 168 hours. Cardiac Enzymes:  Recent Labs Lab 06/19/16 0645 06/19/16 0751 06/19/16 1150 06/19/16 1948  TROPONINI <0.03 0.06*  0.10* 0.13*   BNP (last 3 results) No results for input(s): PROBNP in the last 8760 hours. HbA1C: No results for input(s): HGBA1C in the last 72 hours. CBG:  Recent Labs Lab 06/21/16 1136 06/21/16 1638 06/21/16 2158 06/22/16 0814 06/22/16 1040  GLUCAP 221* 198* 188* 215* 231*   Lipid Profile: No results for input(s): CHOL, HDL, LDLCALC, TRIG, CHOLHDL, LDLDIRECT in the last 72 hours. Thyroid Function Tests: No results for input(s): TSH, T4TOTAL, FREET4, T3FREE, THYROIDAB in the last 72 hours. Anemia Panel: No results for input(s): VITAMINB12, FOLATE, FERRITIN, TIBC, IRON, RETICCTPCT in the last 72 hours. Urine analysis:    Component Value Date/Time   COLORURINE YELLOW 06/19/2016 [place] 06/19/2016 1241   LABSPEC 1.011 06/19/2016 1241   PHURINE 6.0 06/19/2016 1241   GLUCOSEU >=500 (A) 06/19/2016 1241   HGBUR NEGATIVE 06/19/2016 1241   BILIRUBINUR NEGATIVE 06/19/2016 1241   KETONESUR 5 (A) 06/19/2016 1241   PROTEINUR NEGATIVE 06/19/2016 1241   UROBILINOGEN 1.0 09/09/2014 1003   NITRITE POSITIVE (A) 06/19/2016 1241   LEUKOCYTESUR NEGATIVE 06/19/2016 1241   Recent Results (from the past 240 hour(s))    MRSA PCR Screening     Status: None   Collection Time: 06/19/16 12:41 PM  Result Value Ref Range Status   MRSA by PCR NEGATIVE NEGATIVE Final    Comment:        The GeneXpert MRSA Assay (FDA approved for NASAL specimens only), is one component of a comprehensive MRSA colonization surveillance program. It is not intended to diagnose MRSA infection nor to guide or monitor treatment for MRSA infections.   Culture, Urine     Status: Abnormal   Collection Time: 06/19/16 12:44 PM  Result Value Ref Range Status   Specimen Description URINE, RANDOM  Final   Special Requests NONE  Final   Culture <10,000 COLONIES/mL INSIGNIFICANT GROWTH (A)  Final   Report Status 06/21/2016 FINAL  Final      Radiology Studies: No results found.  Scheduled Meds: . atorvastatin  40 mg Oral q1800  . carvedilol  6.25 mg Oral BID WC  . clopidogrel  75 mg Oral Daily  . enoxaparin (LOVENOX) injection  40 mg Subcutaneous Q24H  . furosemide  40 mg Oral Daily  . guaiFENesin  600 mg Oral BID  . insulin aspart  0-15 Units Subcutaneous TID WC  . insulin aspart  0-5 Units Subcutaneous QHS  . quinapril  20 mg Oral Daily  . sodium chloride flush  3 mL Intravenous Q12H  . spironolactone  12.5 mg Oral Daily   Continuous Infusions:    LOS: 3 days   Time spent: 25 minutes.  Vance Gather, MD [facility] Pager [phone]  If 7PM-7AM, please contact night-coverage [email or web address] Password [email or web address] 06/22/2016, 2:42 PM

## 2016-06-22 NOTE — Progress Notes (Signed)
Inpatient Diabetes Program Recommendations  AACE/ADA: New Consensus Statement on Inpatient Glycemic Control (2015)  Target Ranges:  Prepandial:   less than 140 mg/dL      Peak postprandial:   less than 180 mg/dL (1-2 hours)      Critically ill patients:  140 - 180 mg/dL   Results for Devin Hanna, Devin Hanna (MRN [id number]) as of 06/22/2016 10:08  Ref. Range 06/21/2016 07:45 06/21/2016 11:36 06/21/2016 16:38 06/21/2016 21:58  Glucose-Capillary Latest Ref Range: 65 - 99 mg/dL 223 (H) 221 (H) 198 (H) 188 (H)   Results for Devin Hanna, Devin Hanna (MRN [id number]) as of 06/22/2016 10:08  Ref. Range 06/22/2016 08:14  Glucose-Capillary Latest Ref Range: 65 - 99 mg/dL 215 (H)   Results for Devin Hanna, Devin Hanna (MRN [id number]) as of 06/22/2016 10:08  Ref. Range 06/19/2016 07:54  Hemoglobin A1C Latest Ref Range: 4.8 - 5.6 % 7.4 (H)    Admit with: SOB  History: DM, CHF  Home DM Meds: Glipizide 10 mg daily       Metformin 1000 mg BID       [name] 50 mg daily  Current Insulin Orders: Novolog Moderate Correction Scale/ SSI (0-15 units) TID AC + HS       MD- Please consider starting basal insulin for this patient while home oral DM medications are on hold:  Recommend Lantus 10 units daily (0.1 units/kg dosing based on weight of 106 kg)     --Will follow patient during hospitalization--  Wyn Quaker RN, MSN, CDE Diabetes Coordinator Inpatient Glycemic Control Team Team Pager: [phone] (8a-5p)

## 2016-06-22 NOTE — Progress Notes (Signed)
Progress Note  Patient Name: Devin Hanna Date of Encounter: 06/22/2016  Primary Cardiologist: Jerilynn Mages. Fletcher Anon, MD   Subjective   Feeling much better.  Breathing @ baseline.  No chest pain.  Inpatient Medications    Scheduled Meds: . atorvastatin  40 mg Oral q1800  . clopidogrel  75 mg Oral Daily  . enoxaparin (LOVENOX) injection  40 mg Subcutaneous Q24H  . furosemide  40 mg Intravenous BID  . guaiFENesin  600 mg Oral BID  . insulin aspart  0-15 Units Subcutaneous TID WC  . insulin aspart  0-5 Units Subcutaneous QHS  . quinapril  20 mg Oral Daily  . sodium chloride flush  3 mL Intravenous Q12H   Continuous Infusions:  PRN Meds: acetaminophen **OR** acetaminophen, albuterol, ondansetron **OR** ondansetron (ZOFRAN) IV   Vital Signs    Vitals:   06/21/16 0500 06/21/16 0953 06/21/16 1524 06/21/16 2033  BP: 125/68 (!) 126/54 103/68 132/71  Pulse: (!) 59 71 69 70  Resp: 16 15 14 20   Temp: 98.6 F (37 C)  98.1 F (36.7 C) 98.7 F (37.1 C)  TempSrc: Oral  Oral   SpO2: 98% 90% 97% 95%  Weight: 233 lb 6.4 oz (105.9 kg)       Intake/Output Summary (Last 24 hours) at 06/22/16 0906 Last data filed at 06/22/16 0800  Gross per 24 hour  Intake              880 ml  Output             2300 ml  Net            -1420 ml   Filed Weights   06/20/16 0450 06/21/16 0500  Weight: 242 lb 6.4 oz (110 kg) 233 lb 6.4 oz (105.9 kg)    Physical Exam   GEN: Well nourished, well developed, in no acute distress.  HEENT: Grossly normal.  Neck: Supple, no JVD, carotid bruits, or masses. Cardiac: RRR, no murmurs, rubs, or gallops. No clubbing, cyanosis, edema.  Radials/DP/PT 2+ and equal bilaterally.  Respiratory:  Respirations regular and unlabored, clear to auscultation bilaterally. GI: Soft, nontender, nondistended, BS + x 4. MS: no deformity or atrophy. Skin: warm and dry, no rash. Neuro:  Strength and sensation are intact. Psych: AAOx3.  Normal affect.  Labs    Chemistry Recent  Labs Lab 06/20/16 0509 06/21/16 0704 06/22/16 0553  NA 141 138 138  K 3.6 4.7 3.8  CL 103 102 98*  CO2 26 23 27   GLUCOSE 135* 186* 207*  BUN 14 17 15   CREATININE 1.10 1.05 1.00  CALCIUM 8.9 8.6* 8.9  PROT 6.2*  --   --   ALBUMIN 3.1*  --   --   AST 61*  --   --   ALT 46  --   --   ALKPHOS 80  --   --   BILITOT 1.1  --   --   GFRNONAA >60 >60 >60  GFRAA >60 >60 >60  ANIONGAP 12 13 13      Hematology Recent Labs Lab 06/19/16 0645 06/19/16 0652 06/20/16 0509 06/21/16 0704  WBC 12.3*  --  15.8* 9.3  RBC 5.00  --  4.45 4.55  HGB 16.3 16.7 14.4 14.8  HCT 48.6 49.0 42.7 44.3  MCV 97.2  --  96.0 97.4  MCH 32.6  --  32.4 32.5  MCHC 33.5  --  33.7 33.4  RDW 14.1  --  14.1 14.3  PLT 261  --  197 188    Cardiac Enzymes Recent Labs Lab 06/19/16 0645 06/19/16 0751 06/19/16 1150 06/19/16 1948  TROPONINI <0.03 0.06* 0.10* 0.13*    Recent Labs Lab 06/19/16 0643  TROPIPOC 0.00     BNP Recent Labs Lab 06/19/16 0646  BNP 339.6*     Radiology    No results found.  Telemetry    rsr - Personally Reviewed  Cardiac Studies   2D Echocardiogram 3.13.2018  Study Conclusions   - Left ventricle: Systolic function was moderately reduced. The   estimated ejection fraction was 40%. Diffuse hypokinesis. Doppler   parameters are consistent with abnormal left ventricular   relaxation (grade 1 diastolic dysfunction). Doppler parameters   are consistent with high ventricular filling pressure. - Regional wall motion abnormality: Severe hypokinesis of the   entire inferior myocardium. - Mitral valve: Moderately calcified annulus. Mildly thickened   leaflets . There was mild regurgitation. - Left atrium: The atrium was mildly dilated. - Right ventricle: The cavity size was mildly dilated.   Impressions:   - Recommend contrast enhancement for more accurate endocardial   defintion. Cannot exclude apical mural thrombus.   Patient Profile     66 y.o. male w/ a h/o  CAD s/p prior RCA stenting (patent on cath 09/2014), ICM/HFrEF (EF prev 40% by LV gram 09/2014), DMII, HTN, HL, and obesity, who was admitted 5/11 w/ acute resp failure and acute on chronic systolic chf.  Assessment & Plan    1. Acute on chronic HFrEF:  Prev EF 40% by LV gram in 09/2014.  Echo 5/13 showed EF of 40% w/ diff HK, Gr1 DD, sev inferior HK.  He is minus 770 ml overnight and 6.8L for admission.  Wt is down to 233 lbs, which is the lowest he's been in some time.  He is feeling much better and is euvolemic on exam.  EF 40% by echo, which is stable dating back to LV gram in 09/2014.  Transition to PO lasix.  Was on 40 daily @ home.  Will resume and add spiro 12.5 daily.  He eats out regularly and I suspect salt load is putting him @ risk of worsening CHF and acute pulmonary edema.  He does not routinely weigh himself @ home.  We discussed the importance of daily weights, sodium restriction, medication compliance, and symptom reporting and he verbalizes understanding.  Resume coreg @ 6.25 mg bid.  Was on 12.5 bid @ home - initially held due to relative hypotension on admission.  Cont acei.     2.  CAD/elevated troponin:  h/o inf STEMI in 2015 with cath @ that time showing subtotal occlusion of the RCA  DES.  F/u cath in 09/2014 showed widely patent stent.  Troponin this admission peaked @ 0.13.  No chest pain.  As above, breathing stable.  Echo yesterday showed stable LV dysfxn w/ EF 40% - prev 35-45% by V gram in 82016.  Troponin leak most likely represents demand ischemia in the setting of acute pulmonary edema/chf.  Will defer further ischemic w/u @ this time in light of stable LV fxn. Can consider outpt stress testing in the future.  Resume  blocker, cont statin, plavix.  No ASA 2/2 h/o gastric ulcers per Dr. Tyrell Antonio outpt clinic note.  3.  Essential HTN:  Stable.  Will resume coreg @ lower dose - 6.25 bid.  Cont acei.  4.  HL:  LDL 36 in 01/2016.  Cont statin.  5.  Type II DM:  Insulin per  IM.  6.  ? Apical thrombus:  Will obtain limited echo w/ definity.   Signed, Murray Hodgkins, NP  06/22/2016, 9:06 AM     Attending Note:   The patient was seen and examined.  Agree with assessment and plan as noted above.  Changes made to the above note as needed.  Patient seen and independently examined with Ignacia Bayley, NP .   We discussed all aspects of the encounter. I agree with the assessment and plan as stated above.  1. CAD :  No angina  Small bump in troponin - likely due to demand ischemia .   2. Acute on chronic CHF .   Continue to diurese.  Breathing well Awaiting limited echo with definity to assess his LV apex better - ( possible apical thrombus )   He has ambulated and is feeling better.   Ready to go home soon    I have spent a total of 30 minutes with patient reviewing hospital  notes , telemetry, EKGs, labs and examining patient as well as establishing an assessment and plan that was discussed with the patient. > 50% of time was spent in direct patient care.    Thayer Headings, Brooke Bonito., MD, [facility] 06/22/2016, 11:50 AM [address]  [place] Pager [phone]

## 2016-06-23 ENCOUNTER — Other Ambulatory Visit: Payer: Self-pay | Admitting: Family Medicine

## 2016-06-23 ENCOUNTER — Telehealth: Payer: Self-pay | Admitting: *Deleted

## 2016-06-23 ENCOUNTER — Other Ambulatory Visit: Payer: Self-pay | Admitting: Cardiovascular Disease

## 2016-06-23 DIAGNOSIS — Z9889 Other specified postprocedural states: Secondary | ICD-10-CM

## 2016-06-23 LAB — BASIC METABOLIC PANEL
ANION GAP: 10 (ref 5–15)
BUN: 15 mg/dL (ref 6–20)
CALCIUM: 9 mg/dL (ref 8.9–10.3)
CO2: 27 mmol/L (ref 22–32)
Chloride: 98 mmol/L — ABNORMAL LOW (ref 101–111)
Creatinine, Ser: 1.01 mg/dL (ref 0.61–1.24)
Glucose, Bld: 232 mg/dL — ABNORMAL HIGH (ref 65–99)
POTASSIUM: 4 mmol/L (ref 3.5–5.1)
SODIUM: 135 mmol/L (ref 135–145)

## 2016-06-23 LAB — GLUCOSE, CAPILLARY
GLUCOSE-CAPILLARY: 235 mg/dL — AB (ref 65–99)
Glucose-Capillary: 215 mg/dL — ABNORMAL HIGH (ref 65–99)

## 2016-06-23 MED ORDER — CARVEDILOL 12.5 MG PO TABS: 6.2500 mg | ORAL_TABLET | Freq: Two times a day (BID) | ORAL | Status: DC

## 2016-06-23 MED ORDER — CLOPIDOGREL BISULFATE 75 MG PO TABS: 75.0000 mg | ORAL_TABLET | Freq: Every day | ORAL | 0 refills | Status: DC

## 2016-06-23 MED ORDER — SPIRONOLACTONE 25 MG PO TABS: 12.5000 mg | ORAL_TABLET | Freq: Every day | ORAL | 0 refills | Status: DC

## 2016-06-23 MED ORDER — FUROSEMIDE 40 MG PO TABS: 40.0000 mg | ORAL_TABLET | Freq: Every day | ORAL | 0 refills | Status: DC

## 2016-06-23 NOTE — Telephone Encounter (Signed)
Patient currently admitted at this time.

## 2016-06-23 NOTE — Progress Notes (Signed)
Discharged to home, patient left unit via wheelchair, accompanied by volunteer transportation services. Verbalized understanding of where to pick up medication and all follow up appointment. Written instructions provided for new medications and heart failure instructions.

## 2016-06-23 NOTE — Discharge Instructions (Signed)
Heart Failure Heart failure is a condition in which the heart has trouble pumping blood because it has become weak or stiff. This means that the heart does not pump blood efficiently for the body to work well. For some people with heart failure, fluid may back up into the lungs and there may be swelling (edema) in the lower legs. Heart failure is usually a long-term (chronic) condition. It is important for you to take good care of yourself and follow the treatment plan from your health care provider. What are the causes? This condition is caused by some health problems, including:  High blood pressure (hypertension). Hypertension causes the heart muscle to work harder than normal. High blood pressure eventually causes the heart to become stiff and weak.  Coronary artery disease (CAD). CAD is the buildup of cholesterol and fat (plaques) in the arteries of the heart.  Heart attack (myocardial infarction). Injured tissue, which is caused by the heart attack, does not contract as well and the heart's ability to pump blood is weakened.  Abnormal heart valves. When the heart valves do not open and close properly, the heart muscle must pump harder to keep the blood flowing.  Heart muscle disease (cardiomyopathy or myocarditis). Heart muscle disease is damage to the heart muscle from a variety of causes, such as drug or alcohol abuse, infections, or unknown causes. These can increase the risk of heart failure.  Lung disease. When the lungs do not work properly, the heart must work harder. What increases the risk? Risk of heart failure increases as a person ages. This condition is also more likely to develop in people who:  Are overweight.  Are male.  Smoke or chew tobacco.  Abuse alcohol or illegal drugs.  Have taken medicines that can damage the heart, such as chemotherapy drugs.  Have diabetes.  High blood sugar (glucose) is associated with high fat (lipid) levels in the blood.  Diabetes  can also damage tiny blood vessels that carry nutrients to the heart muscle.  Have abnormal heart rhythms.  Have thyroid problems.  Have low blood counts (anemia). What are the signs or symptoms? Symptoms of this condition include:  Shortness of breath with activity, such as when climbing stairs.  Persistent cough.  Swelling of the feet, ankles, legs, or abdomen.  Unexplained weight gain.  Difficulty breathing when lying flat (orthopnea).  Waking from sleep because of the need to sit up and get more air.  Rapid heartbeat.  Fatigue and loss of energy.  Feeling light-headed, dizzy, or close to fainting.  Loss of appetite.  Nausea.  Increased urination during the night (nocturia).  Confusion. How is this diagnosed? This condition is diagnosed based on:  Medical history, symptoms, and a physical exam.  Diagnostic tests, which may include:  Echocardiogram.  Electrocardiogram (ECG).  Chest X-ray.  Blood tests.  Exercise stress test.  Radionuclide scans.  Cardiac catheterization and angiogram. How is this treated? Treatment for this condition is aimed at managing the symptoms of heart failure. Medicines, behavioral changes, or other treatments may be necessary to treat heart failure. Medicines  These may include:  Angiotensin-converting enzyme (ACE) inhibitors. This type of medicine blocks the effects of a blood protein called angiotensin-converting enzyme. ACE inhibitors relax (dilate) the blood vessels and help to lower blood pressure.  Angiotensin receptor blockers (ARBs). This type of medicine blocks the actions of a blood protein called angiotensin. ARBs dilate the blood vessels and help to lower blood pressure.  Water pills (diuretics). Diuretics  cause the kidneys to remove salt and water from the blood. The extra fluid is removed through urination, leaving a lower volume of blood that the heart has to pump.  Beta blockers. These improve heart muscle  strength and they prevent the heart from beating too quickly.  Digoxin. This increases the force of the heartbeat. Healthy behavior changes  These may include:  Reaching and maintaining a healthy weight.  Stopping smoking or chewing tobacco.  Eating heart-healthy foods.  Limiting or avoiding alcohol.  Stopping use of street drugs (illegal drugs).  Physical activity. Other treatments  These may include:  Surgery to open blocked coronary arteries or repair damaged heart valves.  Placement of a biventricular pacemaker to improve heart muscle function (cardiac resynchronization therapy). This device paces both the right ventricle and left ventricle.  Placement of a device to treat serious abnormal heart rhythms (implantable cardioverter defibrillator, or ICD).  Placement of a device to improve the pumping ability of the heart (left ventricular assist device, or LVAD).  Heart transplant. This can cure heart failure, and it is considered for certain patients who do not improve with other therapies. Follow these instructions at home: Medicines   Take over-the-counter and prescription medicines only as told by your health care provider. Medicines are important in reducing the workload of your heart, slowing the progression of heart failure, and improving your symptoms.  Do not stop taking your medicine unless your health care provider told you to do that.  Do not skip any dose of medicine.  Refill your prescriptions before you run out of medicine. You need your medicines every day. Eating and drinking    Eat heart-healthy foods. Talk with a dietitian to make an eating plan that is right for you.  Choose foods that contain no trans fat and are low in saturated fat and cholesterol. Healthy choices include fresh or frozen fruits and vegetables, fish, lean meats, legumes, fat-free or low-fat dairy products, and whole-grain or high-fiber foods.  Limit salt (sodium) if directed by  your health care provider. Sodium restriction may reduce symptoms of heart failure. Ask a dietitian to recommend heart-healthy seasonings.  Use healthy cooking methods instead of frying. Healthy methods include roasting, grilling, broiling, baking, poaching, steaming, and stir-frying.  Limit your fluid intake if directed by your health care provider. Fluid restriction may reduce symptoms of heart failure. Lifestyle   Stop smoking or using chewing tobacco. Nicotine and tobacco can damage your heart and your blood vessels. Do not use nicotine gum or patches before talking to your health care provider.  Limit alcohol intake to no more than 1 drink per day for non-pregnant women and 2 drinks per day for men. One drink equals 12 oz of beer, 5 oz of wine, or 1 oz of hard liquor.  Drinking more than that is harmful to your heart. Tell your health care provider if you drink alcohol several times a week.  Talk with your health care provider about whether any level of alcohol use is safe for you.  If your heart has already been damaged by alcohol or you have severe heart failure, drinking alcohol should be stopped completely.  Stop use of illegal drugs.  Lose weight if directed by your health care provider. Weight loss may reduce symptoms of heart failure.  Do moderate physical activity if directed by your health care provider. People who are elderly and people with severe heart failure should consult with a health care provider for physical activity recommendations. Monitor  important information   Weigh yourself every day. Keeping track of your weight daily helps you to notice excess fluid sooner.  Weigh yourself every morning after you urinate and before you eat breakfast.  Wear the same amount of clothing each time you weigh yourself.  Record your daily weight. Provide your health care provider with your weight record.  Monitor and record your blood pressure as told by your health care  provider.  Check your pulse as told by your health care provider. Dealing with extreme temperatures   If the weather is extremely hot:  Avoid vigorous physical activity.  Use air conditioning or fans or seek a cooler location.  Avoid caffeine and alcohol.  Wear loose-fitting, lightweight, and light-colored clothing.  If the weather is extremely cold:  Avoid vigorous physical activity.  Layer your clothes.  Wear mittens or gloves, a hat, and a scarf when you go outside.  Avoid alcohol. General instructions   Manage other health conditions such as hypertension, diabetes, thyroid disease, or abnormal heart rhythms as told by your health care provider.  Learn to manage stress. If you need help to do this, ask your health care provider.  Plan rest periods when fatigued.  Get ongoing education and support as needed.  Participate in or seek rehabilitation as needed to maintain or improve independence and quality of life.  Stay up to date with immunizations. Keeping current on pneumococcal and influenza immunizations is especially important to prevent respiratory infections.  Keep all follow-up visits as told by your health care provider. This is important. Contact a health care provider if:  You have a rapid weight gain.  You have increasing shortness of breath that is unusual for you.  You are unable to participate in your usual physical activities.  You tire easily.  You cough more than normal, especially with physical activity.  You have any swelling or more swelling in areas such as your hands, feet, ankles, or abdomen.  You are unable to sleep because it is hard to breathe.  You feel like your heart is beating quickly (palpitations).  You become dizzy or light-headed when you stand up. Get help right away if:  You have difficulty breathing.  You notice or your family notices a change in your awareness, such as having trouble staying awake or having  difficulty with concentration.  You have pain or discomfort in your chest.  You have an episode of fainting (syncope). This information is not intended to replace advice given to you by your health care provider. Make sure you discuss any questions you have with your health care provider. Document Released: 01/26/2005 Document Revised: 10/01/2015 Document Reviewed: 08/21/2015 Elsevier Interactive Patient Education  2017 [place].    Acute Respiratory Failure, Adult Acute respiratory failure occurs when there is not enough oxygen passing from your lungs to your body. When this happens, your lungs have trouble removing carbon dioxide from the blood. This causes your blood oxygen level to drop too low as carbon dioxide builds up. Acute respiratory failure is a medical emergency. It can develop quickly, but it is temporary if treated promptly. Your lung capacity, or how much air your lungs can hold, may improve with time, exercise, and treatment. What are the causes? There are many possible causes of acute respiratory failure, including:  Lung injury.  Chest injury or damage to the ribs or tissues near the lungs.  Lung conditions that affect the flow of air and blood into and out of the lungs,  such as pneumonia, acute respiratory distress syndrome, and cystic fibrosis.  Medical conditions, such as strokes or spinal cord injuries, that affect the muscles and nerves that control breathing.  Blood infection (sepsis).  Inflammation of the pancreas (pancreatitis).  A blood clot in the lungs (pulmonary embolism).  A large-volume blood transfusion.  Burns.  Near-drowning.  Seizure.  Smoke inhalation.  Reaction to medicines.  Alcohol or drug overdose. What increases the risk? This condition is more likely to develop in people who have:  A blocked airway.  Asthma.  A condition or disease that damages or weakens the muscles, nerves, bones, or tissues that are involved in  breathing.  A serious infection.  A health problem that blocks the unconscious reflex that is involved in breathing, such as hypothyroidism or sleep apnea.  A lung injury or trauma. What are the signs or symptoms? Trouble breathing is the main symptom of acute respiratory failure. Symptoms may also include:  Rapid breathing.  Restlessness or anxiety.  Skin, lips, or fingernails that appear blue (cyanosis).  Rapid heart rate.  Abnormal heart rhythms (arrhythmias).  Confusion or changes in behavior.  Tiredness or loss of energy.  Feeling sleepy or having a loss of consciousness. How is this diagnosed? Your health care provider can diagnose acute respiratory failure with a medical history and physical exam. During the exam, your health care provider will listen to your heart and check for crackling or wheezing sounds in your lungs. Your may also have tests to confirm the diagnosis and determine what is causing respiratory failure. These tests may include:  Measuring the amount of oxygen in your blood (pulse oximetry). The measurement comes from a small device that is placed on your finger, earlobe, or toe.  Other blood tests to measure blood gases and to look for signs of infection.  Sampling your cerebral spinal fluid or tracheal fluid to check for infections.  Chest X-ray to look for fluid in spaces that should be filled with air.  Electrocardiogram (ECG) to look at the heart's electrical activity. How is this treated? Treatment for this condition usually takes places in a hospital intensive care unit (ICU). Treatment depends on what is causing the condition. It may include one or more treatments until your symptoms improve. Treatment may include:  Supplemental oxygen. Extra oxygen is given through a tube in the nose, a face mask, or a hood.  A device such as a continuous positive airway pressure (CPAP) or bi-level positive airway pressure (BiPAP or BPAP) machine. This  treatment uses mild air pressure to keep the airways open. A mask or other device will be placed over your nose or mouth. A tube that is connected to a motor will deliver oxygen through the mask.  Ventilator. This treatment helps move air into and out of the lungs. This may be done with a bag and mask or a machine. For this treatment, a tube is placed in your windpipe (trachea) so air and oxygen can flow to the lungs.  Extracorporeal membrane oxygenation (ECMO). This treatment temporarily takes over the function of the heart and lungs, supplying oxygen and removing carbon dioxide. ECMO gives the lungs a chance to recover. It may be used if a ventilator is not effective.  Tracheostomy. This is a procedure that creates a hole in the neck to insert a breathing tube.  Receiving fluids and medicines.  Rocking the bed to help breathing. Follow these instructions at home:  Take over-the-counter and prescription medicines only as told by  your health care provider.  Return to normal activities as told by your health care provider. Ask your health care provider what activities are safe for you.  Keep all follow-up visits as told by your health care provider. This is important. How is this prevented? Treating infections and medical conditions that may lead to acute respiratory failure can help prevent the condition from developing. Contact a health care provider if:  You have a fever.  Your symptoms do not improve or they get worse. Get help right away if:  You are having trouble breathing.  You lose consciousness.  Your have cyanosis or turn blue.  You develop a rapid heart rate.  You are confused. These symptoms may represent a serious problem that is an emergency. Do not wait to see if the symptoms will go away. Get medical help right away. Call your local emergency services (911 in the [place] Do not drive yourself to the hospital. This information is not intended to replace advice given  to you by your health care provider. Make sure you discuss any questions you have with your health care provider. Document Released: 01/31/2013 Document Revised: 08/24/2015 Document Reviewed: 08/14/2015 Elsevier Interactive Patient Education  2017 [employer].

## 2016-06-23 NOTE — Discharge Summary (Signed)
Physician Discharge Summary  Devin Hanna UYQ:[id number] DOB: 01-01-1951 DOA: 06/19/2016  PCP: [name], MD  Admit date: 06/19/2016 Discharge date: 06/23/2016  Admitted From: Home Disposition: Home   Recommendations for Outpatient Follow-up:  1. Follow up with cardiology within next week to reevaluate and titrate medications. Discharge weight 233lbs, discharged on lasix 27m po daily, added spironolactone 12.590mdaily and decreased coreg to 6.2569mID (from 12.5mg30mD) due to hypotension.  2. Follow up with PCP in next 1 - 2 weeks for management of other chronic medical conditions. 3. Please obtain BMP 4. LV function stable, so plan to continue medical therapy, could consider outpatient stress testing if symptoms.   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 BNP was elevated at 340 and he was admitted for acute respiratory failure likely related to acute on chronic systolic heart failure. With diuresis, symptoms resolved. Echocardiogram was performed without definity and could not rule out apical thrombus, so a limited echo with definity was performed prior to discharge which saw no thrombus.  Discharge Diagnoses:  Principal Problem:   Acute respiratory failure with hypoxia (HCC)Musetive Problems:   CAD (coronary artery disease)   DM type 2 (diabetes mellitus, type 2) (HCC)[place]bid obesity (HCC)[place] (hypertension)   Acute on chronic systolic congestive heart failure (HCC)   Hyperlipidemia   Peptic ulcer disease   Elevated lactic acid level  Demand ischemia (HCC)   Acute lower UTI   Sepsis (HCC)[place] respiratory failure with hypoxia: Acute CHF +/- CAP: CXR with cardiomegaly, PVC, interstitial edema and possibly asymmetric edema vs. infiltrate.  - Briefly required BiPAP, has been stable off BiPAP, on room air since 5/13.  Sepsis ruled out: Met definition with hypothermia, leukocytosis, tachypnea on admission with uncertain source (pulm vs. urinary). Also with hypotension and lactic acid elevation. Doubtful of UTI as urine culture was insignificant growth. Pulmonary less likely as infiltrates resolved overnight with diuresis, as did leukocytosis. Hypothermia was 96.63F by temporal measurement with no subsequent hypothermia or fever. Blood cultures were not drawn.  - Received ceftriaxone 5/11-5/13 - Lactate improved, suspect due to cardiogenic hypoperfusion.  Acute on chronic systolic heart failure: Echo 2016 with an EF of 30-35% with mild MR > repeat echocardiogram shows EF 40%, diffuse hypokinesis with severe hypokinesis of inferior myocardium, G1DD. BNP 339. Home medications include Coreg, ACE inhibitor, Lasix 40 mg by mouth daily.  - Cardiology consulted. Transitioned back to home po lasix 40mg52mly after IV diuresis, down 9lbs and creatinine holding. - Restarted low dose ACE inhibitor, and low dose beta blocker  CAD s/p STEMI without chest pain in 2016 treated with RCA atherectomy and stenting, last cath showed minimal restenosis, moderate LAD disease. Followed by Dr. AridaFletcher Anonponin mildly elevated (peak at 0.13), consistent with demand ischemia without ECG changes.  - Continue plavix (no ASA due to PUD),  - EF has not progressively worsened, so no ischemic work up is planned.   NIDDM: Hyperglycemic on admission without gap or acidosis ?related to infection. HbA1c 7.4%  Discharge Instructions Discharge Instructions    Diet - low  sodium heart healthy    Complete by:  As directed    Discharge instructions    Complete by:   As directed    You were admitted for heart failure and improved with therapy. You are stable for discharge with the following recommendations:  - Continue taking lasix 49m once daily - START taking spironolactone 12.556mdaily (sent to your pharmacy) - CHANGE your coreg from one tablet twice daily to 1/2 tablet twice daily.  - Continue taking other medications as directed - Follow up with cardiology within the next week and follow up with your PCP in the next 1 - 2 weeks.  - If you experience chest pain or trouble breathing, please seek medical attention right away.     Allergies as of 06/23/2016   No Known Allergies     Medication List    TAKE these medications   atorvastatin 40 MG tablet Commonly known as:  LIPITOR Take 1 tablet (40 mg total) by mouth daily.   carvedilol 12.5 MG tablet Commonly known as:  COREG Take 0.5 tablets (6.25 mg total) by mouth 2 (two) times daily with a meal. What changed:  how much to take   Cinnamon 500 MG capsule Take 1,000 mg by mouth 2 (two) times daily with a meal.   docusate sodium 100 MG capsule Commonly known as:  COLACE Take 1 capsule (100 mg total) by mouth 2 (two) times daily.   ferrous sulfate 325 (65 FE) MG tablet Commonly known as:  FERROUSUL Take 1 tablet (325 mg total) by mouth 2 (two) times daily with a meal.   gabapentin 100 MG capsule Commonly known as:  NEURONTIN Take 2 capsules (200 mg total) by mouth 3 (three) times daily.   GLIPIZIDE XL 10 MG 24 hr tablet Generic drug:  glipiZIDE TAKE ONE TABLET BY MOUTH ONCE DAILY WITHBREAKFAST   [name] 100 MG tablet Generic drug:  sitaGLIPtin TAKE 1/2 TABLET BY MOUTH DAILY   multivitamin with minerals Tabs tablet Take 1 tablet by mouth daily.   nitroGLYCERIN 0.4 MG SL tablet Commonly known as:  NITROSTAT Place 1 tablet (0.4 mg total) under the tongue every 5 (five) minutes as needed for chest pain.   pantoprazole 40 MG tablet Commonly known as:  PROTONIX Take 1 tablet  (40 mg total) by mouth 2 (two) times daily.   quinapril 20 MG tablet Commonly known as:  ACCUPRIL Take 1 tablet (20 mg total) by mouth daily.   spironolactone 25 MG tablet Commonly known as:  ALDACTONE Take 0.5 tablets (12.5 mg total) by mouth daily. Start taking on:  06/24/2016      Follow-up Information    SoLeone HavenMD. Schedule an appointment as soon as possible for a visit in 2 week(s).   Specialty:  Family Medicine Contact information: [address][phone]        BeRogelia MireNP. Schedule an appointment as soon as possible for a visit in 1 week(s).   Specialties:  Nurse Practitioner, Cardiology, Radiology Contact information: [address] [phone]        No Known Allergies  Consultations:  Cardiology, Dr. NaAcie FredricksonProcedures/Studies: Dg Chest Port 1 View  Result Date: 06/20/2016 CLINICAL DATA:  Shortness of breath EXAM: PORTABLE CHEST 1 VIEW COMPARISON:  Jun 19, 2016 FINDINGS: No pneumothorax. Mild cardiomegaly. Resolution of the right basilar opacity. No focal infiltrates remain. IMPRESSION: Resolution of focal infiltrates. Electronically  Signed   By: Dorise Bullion III M.D   On: 06/20/2016 08:02   Dg Chest Portable 1 View  Result Date: 06/19/2016 CLINICAL DATA:  Shortness of breath.  Wheezing.  History of CHF. EXAM: PORTABLE CHEST 1 VIEW COMPARISON:  09/09/2014 FINDINGS: Cardiac enlargement with mild pulmonary vascular congestion. Small bilateral pleural effusions with basilar interstitial edema. There is asymmetric increased opacity in the right lung base which may indicate developing asymmetric edema or superimposed pneumonia. No pneumothorax. Calcification of the aorta. Old right rib fractures. IMPRESSION: Cardiac enlargement with pulmonary vascular congestion and mild interstitial edema. Increasing opacity in the right lung base may indicate developing  asymmetric edema or superimposed pneumonia. Electronically Signed   By: Lucienne Capers M.D.   On: 06/19/2016 06:52    Echocardiogram 06/22/2016 with definity:  - No apical thrombus (unable to tell with certainty on previous study)   Echocardiogram 06/21/2016: - Left ventricle: Systolic function was moderately reduced. The estimated ejection fraction was 40%. Diffuse hypokinesis. Doppler parameters are consistent with abnormal left ventricular relaxation (grade 1 diastolic dysfunction). Doppler parameters are consistent with high ventricular filling pressure. - Regional wall motion abnormality: Severe hypokinesis of the entire inferior myocardium. - Mitral valve: Moderately calcified annulus. Mildly thickened leaflets. There was mild regurgitation. - Left atrium: The atrium was mildly dilated. - Right ventricle: The cavity size was mildly dilated.  Subjective: Breathing at baseline, ambulating regularly. No chest pain.   Discharge Exam: Vitals:   06/22/16 2114 06/23/16 0455  BP: 131/62 (!) 112/51  Pulse: 65 63  Resp: (!) 21   Temp: 97.8 F (36.6 C) 98.4 F (36.9 C)   General: Pt is alert, awake, not in acute distress Cardiovascular: RRR, S1/S2 +, no rubs, no gallops Respiratory: CTA bilaterally, no wheezing, no rhonchi Abdominal: Soft, NT, ND, bowel sounds + Extremities: No edema, no cyanosis  The results of significant diagnostics from this hospitalization (including imaging, microbiology, ancillary and laboratory) are listed below for reference.    Labs: BNP (last 3 results)  Recent Labs  06/19/16 0646  BNP 627.0*   Basic Metabolic Panel:  Recent Labs Lab 06/19/16 0652 06/19/16 0751 06/20/16 0509 06/21/16 0704 06/22/16 0553 06/23/16 0600  NA 138  --  141 138 138 135  K 4.6  --  3.6 4.7 3.8 4.0  CL 103  --  103 102 98* 98*  CO2  --   --  26 23 27 27   GLUCOSE 358*  --  135* 186* 207* 232*  BUN 9  --  14 17 15 15   CREATININE 1.10  --  1.10  1.05 1.00 1.01  CALCIUM  --   --  8.9 8.6* 8.9 9.0  MG  --  1.8  --   --   --   --    Liver Function Tests:  Recent Labs Lab 06/20/16 0509  AST 61*  ALT 46  ALKPHOS 80  BILITOT 1.1  PROT 6.2*  ALBUMIN 3.1*   No results for input(s): LIPASE, AMYLASE in the last 168 hours. No results for input(s): AMMONIA in the last 168 hours. CBC:  Recent Labs Lab 06/19/16 0645 06/19/16 0652 06/20/16 0509 06/21/16 0704  WBC 12.3*  --  15.8* 9.3  NEUTROABS 8.0*  --   --   --   HGB 16.3 16.7 14.4 14.8  HCT 48.6 49.0 42.7 44.3  MCV 97.2  --  96.0 97.4  PLT 261  --  197 188   Cardiac Enzymes:  Recent Labs Lab  06/19/16 0645 06/19/16 0751 06/19/16 1150 06/19/16 1948  TROPONINI <0.03 0.06* 0.10* 0.13*   BNP: Invalid input(s): POCBNP CBG:  Recent Labs Lab 06/22/16 1040 06/22/16 1634 06/22/16 2110 06/23/16 0742 06/23/16 1120  GLUCAP 231* 231* 204* 215* 235*   D-Dimer No results for input(s): DDIMER in the last 72 hours. Hgb A1c No results for input(s): HGBA1C in the last 72 hours. Lipid Profile No results for input(s): CHOL, HDL, LDLCALC, TRIG, CHOLHDL, LDLDIRECT in the last 72 hours. Thyroid function studies No results for input(s): TSH, T4TOTAL, T3FREE, THYROIDAB in the last 72 hours.  Invalid input(s): FREET3 Anemia work up No results for input(s): VITAMINB12, FOLATE, FERRITIN, TIBC, IRON, RETICCTPCT in the last 72 hours. Urinalysis    Component Value Date/Time   COLORURINE YELLOW 06/19/2016 1241   APPEARANCEUR CLEAR 06/19/2016 1241   LABSPEC 1.011 06/19/2016 1241   PHURINE 6.0 06/19/2016 1241   GLUCOSEU >=500 (A) 06/19/2016 1241   HGBUR NEGATIVE 06/19/2016 1241   BILIRUBINUR NEGATIVE 06/19/2016 1241   KETONESUR 5 (A) 06/19/2016 1241   PROTEINUR NEGATIVE 06/19/2016 1241   UROBILINOGEN 1.0 09/09/2014 1003   NITRITE POSITIVE (A) 06/19/2016 1241   LEUKOCYTESUR NEGATIVE 06/19/2016 1241    Microbiology Recent Results (from the past 240 hour(s))  MRSA PCR  Screening     Status: None   Collection Time: 06/19/16 12:41 PM  Result Value Ref Range Status   MRSA by PCR NEGATIVE NEGATIVE Final    Comment:        The GeneXpert MRSA Assay (FDA approved for NASAL specimens only), is one component of a comprehensive MRSA colonization surveillance program. It is not intended to diagnose MRSA infection nor to guide or monitor treatment for MRSA infections.   Culture, Urine     Status: Abnormal   Collection Time: 06/19/16 12:44 PM  Result Value Ref Range Status   Specimen Description URINE, RANDOM  Final   Special Requests NONE  Final   Culture <10,000 COLONIES/mL INSIGNIFICANT GROWTH (A)  Final   Report Status 06/21/2016 FINAL  Final    Time coordinating discharge: Approximately 40 minutes  Vance Gather, MD  [facility] 06/23/2016, 6:06 PM Pager [phone]

## 2016-06-23 NOTE — Progress Notes (Signed)
Progress Note  Patient Name: Devin Hanna Date of Encounter: 06/23/2016  Primary Cardiologist: Jerilynn Mages. Fletcher Anon, MD   Subjective   Breathing stable.  Walked several laps around the floor yesterday w/o significant limitations or c/p.  Eager to go home.  Inpatient Medications    Scheduled Meds: . atorvastatin  40 mg Oral q1800  . carvedilol  6.25 mg Oral BID WC  . clopidogrel  75 mg Oral Daily  . enoxaparin (LOVENOX) injection  40 mg Subcutaneous Q24H  . furosemide  40 mg Oral Daily  . guaiFENesin  600 mg Oral BID  . insulin aspart  0-20 Units Subcutaneous TID WC  . insulin aspart  0-5 Units Subcutaneous QHS  . quinapril  20 mg Oral Daily  . sodium chloride flush  3 mL Intravenous Q12H  . spironolactone  12.5 mg Oral Daily   Continuous Infusions:  PRN Meds: acetaminophen **OR** acetaminophen, albuterol, ondansetron **OR** ondansetron (ZOFRAN) IV   Vital Signs    Vitals:   06/22/16 1448 06/22/16 1625 06/22/16 2114 06/23/16 0455  BP:  119/61 131/62 (!) 112/51  Pulse:   65 63  Resp:  20 (!) 21   Temp: 98.6 F (37 C) 98.8 F (37.1 C) 97.8 F (36.6 C) 98.4 F (36.9 C)  TempSrc: Oral Oral Oral Oral  SpO2:  96% 98% 96%  Weight:    233 lb 11.2 oz (106 kg)  Height:  5' 8"  (1.727 m)      Intake/Output Summary (Last 24 hours) at 06/23/16 0907 Last data filed at 06/23/16 0743  Gross per 24 hour  Intake             1040 ml  Output              150 ml  Net              890 ml   Filed Weights   06/20/16 0450 06/21/16 0500 06/23/16 0455  Weight: 242 lb 6.4 oz (110 kg) 233 lb 6.4 oz (105.9 kg) 233 lb 11.2 oz (106 kg)    Physical Exam   GEN: Well nourished, well developed, in no acute distress.  HEENT: Grossly normal.  Neck: Supple, no JVD, carotid bruits, or masses. Cardiac: RRR, no murmurs, rubs, or gallops. No clubbing, cyanosis, edema.  Radials/DP/PT 2+ and equal bilaterally.  Respiratory:  Respirations regular and unlabored, clear to auscultation bilaterally. GI:  Soft, nontender, nondistended, BS + x 4. MS: no deformity or atrophy. Skin: warm and dry, no rash. Neuro:  Strength and sensation are intact. Psych: AAOx3.  Normal affect.  Labs    Chemistry  Recent Labs Lab 06/20/16 0509 06/21/16 0704 06/22/16 0553 06/23/16 0600  NA 141 138 138 135  K 3.6 4.7 3.8 4.0  CL 103 102 98* 98*  CO2 26 23 27 27   GLUCOSE 135* 186* 207* 232*  BUN 14 17 15 15   CREATININE 1.10 1.05 1.00 1.01  CALCIUM 8.9 8.6* 8.9 9.0  PROT 6.2*  --   --   --   ALBUMIN 3.1*  --   --   --   AST 61*  --   --   --   ALT 46  --   --   --   ALKPHOS 80  --   --   --   BILITOT 1.1  --   --   --   GFRNONAA >60 >60 >60 >60  GFRAA >60 >60 >60 >60  ANIONGAP 12 13 13  10  Hematology  Recent Labs Lab 06/19/16 0645 06/19/16 3086 06/20/16 0509 06/21/16 0704  WBC 12.3*  --  15.8* 9.3  RBC 5.00  --  4.45 4.55  HGB 16.3 16.7 14.4 14.8  HCT 48.6 49.0 42.7 44.3  MCV 97.2  --  96.0 97.4  MCH 32.6  --  32.4 32.5  MCHC 33.5  --  33.7 33.4  RDW 14.1  --  14.1 14.3  PLT 261  --  197 188    Cardiac Enzymes  Recent Labs Lab 06/19/16 0645 06/19/16 0751 06/19/16 1150 06/19/16 1948  TROPONINI <0.03 0.06* 0.10* 0.13*     Recent Labs Lab 06/19/16 0643  TROPIPOC 0.00     BNP  Recent Labs Lab 06/19/16 0646  BNP 339.6*     Radiology    No results found.  Telemetry    RSR, PVC's - Personally Reviewed  Cardiac Studies   2D Echocardiogram 5.13.2018  Study Conclusions   - Left ventricle: Systolic function was moderately reduced. The   estimated ejection fraction was 40%. Diffuse hypokinesis. Doppler   parameters are consistent with abnormal left ventricular   relaxation (grade 1 diastolic dysfunction). Doppler parameters   are consistent with high ventricular filling pressure. - Regional wall motion abnormality: Severe hypokinesis of the   entire inferior myocardium. - Mitral valve: Moderately calcified annulus. Mildly thickened   leaflets . There  was mild regurgitation. - Left atrium: The atrium was mildly dilated. - Right ventricle: The cavity size was mildly dilated.   Impressions:   - Recommend contrast enhancement for more accurate endocardial   defintion. Cannot exclude apical mural thrombus. _____________  2D Echocardiogram 5.14.2018  Study Conclusions   - Left ventricle: Systolic function was moderately reduced. The   estimated ejection fraction was in the range of 35% to 40%.   Diffuse hypokinesis. Acoustic contrast opacification revealed no   evidence ofthrombus. _____________    Patient Profile     66 y.o. male w/ a h/o CAD s/p prior RCA stenting (patent on cath 09/2014), ICM/HFrEF (EF prev 40% by LV gram 09/2014), DMII, HTN, HL, and obesity, who was admitted 5/11 w/ acute resp failure and acute on chronic systolic chf.  Assessment & Plan    1.  Acute on chronic HFrEF:  Prev EF 40% by LV gram in 09/2014.  Echo 5/13 again w/ EF of 40% and diff hK, Gr1 DD, sev inf HK.  ? Of apical thrombus  limited echo 5/14 did not show this.  Transitioned to oral lasix on 5/14 with addition of spironolactone.  + 950 overnight.  Minus 5.6L overall.  Wt stable @ 233 lbs, which remains below previous wts of 240-245.  Cont  blocker, acei, lasix, spiro @ current dose.  I will arrange for f/u with me in [place] w/in a week to re-eval and titrate meds if necessary.  2.  CAD/Elevated troponin:  H/o Inf STEMI in 2015  DES to RCA.  F/u cath 09/2014 w/ patient stents.  Peak troponin of 0.13 this admission.  No chest pain.  In light of stable LV fxn (EF 40%), will plan to cont medical therapy and defer any further ischemic eval.  Can consider outpt stress testing in future.  Cont plavix,  blocker, statin, and plavix. No asa 2/2 h/o gastric ulcers.  3. Essential HTN:  Stable on  blocker, acei, lasix, spiro.  4.  HL:  LDL 36 in 01/2016.  Cont statin therapy.  5.  Type II DM:  Per IM.  6.  ? Apical thrombus:  F/u echo w/ contrast did not  show thrombus.  Signed, Murray Hodgkins, NP  06/23/2016, 9:07 AM    Attending Note:   The patient was seen and examined.  Agree with assessment and plan as noted above.  Changes made to the above note as needed.  Patient seen and independently examined with Ignacia Bayley, NP.   We discussed all aspects of the encounter. I agree with the assessment and plan as stated above.  1.  CAD :   Minimal troponin elevation Likely demand ischemia  2. Acute on chronic CHF:   Improving .   Advised salt restriction  3. HTN:   Stable     I have spent a total of 40 minutes with patient reviewing hospital  notes , telemetry, EKGs, labs and examining patient as well as establishing an assessment and plan that was discussed with the patient. > 50% of time was spent in direct patient care.    Thayer Headings, Brooke Bonito., MD, [facility] 06/23/2016, 10:28 AM [address],  [place] Pager [phone]

## 2016-06-23 NOTE — Telephone Encounter (Signed)
-----   Message from Blain Pais sent at 06/23/2016  9:30 AM EDT ----- Regarding: tcm/ph 5/25 2:00 Murray Hodgkins, NP

## 2016-06-23 NOTE — Care Management Important Message (Signed)
Important Message  Patient Details  Name: Devin Hanna MRN: [id number] Date of Birth: 1950/12/31   Medicare Important Message Given:  Yes    Anny Sayler Abena 06/23/2016, 11:18 AM

## 2016-06-24 ENCOUNTER — Telehealth: Payer: Self-pay | Admitting: Family Medicine

## 2016-06-24 NOTE — Telephone Encounter (Signed)
Patient released from [facility] 06/23/16 CHF, cardiology has TCM on patient and FU scheduled with Cardiology. FYI

## 2016-06-24 NOTE — Telephone Encounter (Signed)
Patient contacted regarding discharge from [facility] on May 15.  Patient understands to follow up with provider Devin Bayley, NP on 5/25 at 2:00pm at [facility], [place]. Patient understands discharge instructions? yes Patient understands medications and regiment? yes Patient understands to bring all medications to this visit? yes

## 2016-07-03 ENCOUNTER — Ambulatory Visit (INDEPENDENT_AMBULATORY_CARE_PROVIDER_SITE_OTHER): Payer: PPO | Admitting: Nurse Practitioner

## 2016-07-03 ENCOUNTER — Encounter: Payer: Self-pay | Admitting: Nurse Practitioner

## 2016-07-03 VITALS — BP 106/64 | HR 68 | Ht 68.0 in | Wt 241.8 lb

## 2016-07-03 DIAGNOSIS — I5022 Chronic systolic (congestive) heart failure: Secondary | ICD-10-CM | POA: Diagnosis not present

## 2016-07-03 DIAGNOSIS — I2510 Atherosclerotic heart disease of native coronary artery without angina pectoris: Secondary | ICD-10-CM | POA: Diagnosis not present

## 2016-07-03 DIAGNOSIS — I5023 Acute on chronic systolic (congestive) heart failure: Secondary | ICD-10-CM | POA: Diagnosis not present

## 2016-07-03 DIAGNOSIS — E785 Hyperlipidemia, unspecified: Secondary | ICD-10-CM | POA: Diagnosis not present

## 2016-07-03 DIAGNOSIS — I255 Ischemic cardiomyopathy: Secondary | ICD-10-CM | POA: Diagnosis not present

## 2016-07-03 DIAGNOSIS — I10 Essential (primary) hypertension: Secondary | ICD-10-CM

## 2016-07-03 NOTE — Progress Notes (Signed)
Office Visit    Patient Name: Devin Hanna Date of Encounter: 07/03/2016  Primary Care Provider:  [name], MD Primary Cardiologist:  Jerilynn Mages. Fletcher Anon, MD   Chief Complaint    66 year old male with prior history of coronary artery disease, diabetes, HFrEF, hyperlipidemia, ischemic cardiomyopathy, and morbid obesity, who presents for follow-up after recent hospitalization.  Past Medical History    Past Medical History:  Diagnosis Date  . Acute respiratory failure with hypoxia ([place]) 06/2016  . CAD (coronary artery disease)    a. Remote PCI of the RCA;  b. 03/2013 Inf STEMI/PCI: LAD 60-70d, RCA 99p ISR(rota/CBA/DES);  c. 09/2014 MV: inf and infsept infarct, EF 35-40%;  d. 09/2014 Cath: patent RCA stent, stable LAD dzs, EF 40%-->Med rx.  . Diabetes mellitus    sees Dr. Hardin Negus in [place]  . GERD (gastroesophageal reflux disease)   . HFrEF (heart failure with reduced ejection fraction) ([place])   . Hyperlipidemia   . Ischemic cardiomyopathy    a. 09/2014 EF 40% by V gram;  b. 06/2016 Echo: EF 40, diff HK, Gr1 DD, inf HK, no mural thrombus. Mild MR, mildly dil LA, mildly dil RV.   . LBBB (left bundle branch block)    Old  . Morbid obesity ([place])   . Peripheral neuropathy   . Vertigo    Past Surgical History:  Procedure Laterality Date  . CARDIAC CATHETERIZATION N/A 10/03/2014   Procedure: Left Heart Cath and Coronary Angiography;  Surgeon: [name], MD;  Location: [facility] CV LAB;  Service: Cardiovascular;  Laterality: N/A;  . CIRCUMCISION N/A 07/22/2015   Procedure: CIRCUMCISION ADULT;  Surgeon: Hollice Espy, MD;  Location: [facility] ORS;  Service: Urology;  Laterality: N/A;  . CORONARY STENT PLACEMENT    . ESOPHAGOGASTRODUODENOSCOPY (EGD) WITH PROPOFOL N/A 09/12/2014   Procedure: ESOPHAGOGASTRODUODENOSCOPY (EGD) WITH PROPOFOL;  Surgeon: Ronald Lobo, MD;  Location: [facility] ENDOSCOPY;  Service: Endoscopy;  Laterality: N/A;  . EXTERNAL FIXATION LEG Left 04/03/2012   Procedure: EXTERNAL FIXATION LEG;  Surgeon: Wylene Simmer, MD;  Location: [place];  Service: Orthopedics;  Laterality: Left;  . EXTERNAL FIXATION REMOVAL Left 04/14/2012   Procedure: REMOVAL EXTERNAL FIXATION LEG;  Surgeon: Wylene Simmer, MD;  Location: [place];  Service: Orthopedics;  Laterality: Left;  . IRRIGATION AND DEBRIDEMENT KNEE Left 04/03/2012   Procedure: IRRIGATION AND DEBRIDEMENT KNEE;  Surgeon: Wylene Simmer, MD;  Location: [place];  Service: Orthopedics;  Laterality: Left;  . LEFT HEART CATHETERIZATION WITH CORONARY ANGIOGRAM N/A 04/05/2013   Procedure: LEFT HEART CATHETERIZATION WITH CORONARY ANGIOGRAM;  Surgeon: [name], MD;  Location: [place] CATH LAB;  Service: Cardiovascular;  Laterality: N/A;  . MASS EXCISION N/A 07/22/2015   Procedure: EXCISION PENILE MASS;  Surgeon: Hollice Espy, MD;  Location: [facility] ORS;  Service: Urology;  Laterality: N/A;  . ORIF TIBIA PLATEAU Left 04/14/2012   Procedure: OPEN REDUCTION INTERNAL FIXATION (ORIF) TIBIAL PLATEAU;  Surgeon: Wylene Simmer, MD;  Location: [place];  Service: Orthopedics;  Laterality: Left;  . PERCUTANEOUS CORONARY ROTOBLATOR INTERVENTION (PCI-R) N/A 04/06/2013   Procedure: PERCUTANEOUS CORONARY ROTOBLATOR INTERVENTION (PCI-R);  Surgeon: [name], MD;  Location: [facility] CATH LAB;  Service: Cardiovascular;  Laterality: N/A;    Allergies  No Known Allergies  History of Present Illness    66 year old male with the above complex past medical history including coronary artery disease status post prior right coronary artery stenting with subsequent repeat stenting in 2015 in the setting of an inferior STEMI. Last  diagnostic catheterization was performed 2016 showed stable anatomy. He also has a history of ischemic cardiomyopathy and HFrEF with an EF of 40%. Other history includes diabetes, hyperlipidemia, left bundle branch block, GERD, and obesity. He was recently admitted to [facility] with progressive dyspnea and volume overload. He diuresed  total of 5.6 L with a discharge weight of 233 pounds on hospital scales. We added spironolactone to his daily regimen and continued his previously prescribed dose of Lasix. He did have a troponin elevation up to 0.13 during admission but this was felt to be secondary to demand ischemia in the setting of heart failure. EF was stable at 40% by echo He says that since his discharge, his weight has been stable on his home scale but I think it and thus ischemic evaluation was deferred.  Since discharge, he reports doing well. He has a scale at home but thinks it's broken as he is weighing 225 on that scale. It has been consistent however. He says his breathing has been stable and denies chest pain, palpitations, dyspnea, PND, orthopnea, dizziness, syncope, or early satiety. He has noted some lower extremity swelling per the end of the day. He reports compliance with medications. His prednisone meals at home and really watching salt closely.   Home Medications    Prior to Admission medications   Medication Sig Start Date End Date Taking? Authorizing Provider  atorvastatin (LIPITOR) 40 MG tablet Take 1 tablet (40 mg total) by mouth daily. 01/07/16  Yes [name], MD  carvedilol (COREG) 12.5 MG tablet Take 0.5 tablets (6.25 mg total) by mouth 2 (two) times daily with a meal. 06/23/16  Yes Patrecia Pour, MD  Cinnamon 500 MG capsule Take 1,000 mg by mouth 2 (two) times daily with a meal.   Yes [provider]  clopidogrel (PLAVIX) 75 MG tablet Take 1 tablet (75 mg total) by mouth daily with breakfast. 06/23/16  Yes [name], MD  ferrous sulfate (FERROUSUL) 325 (65 FE) MG tablet Take 1 tablet (325 mg total) by mouth 2 (two) times daily with a meal. 11/01/14  Yes [name], MD  furosemide (LASIX) 40 MG tablet Take 1 tablet (40 mg total) by mouth daily. 06/23/16  Yes [name], MD  gabapentin (NEURONTIN) 100 MG capsule Take 2 capsules (200 mg total) by mouth 3 (three) times  daily. 01/09/16  Yes [name], MD  GLIPIZIDE XL 10 MG 24 hr tablet TAKE ONE TABLET BY MOUTH ONCE DAILY WITHBREAKFAST 03/27/16  Yes [name], MD  [name] 100 MG tablet TAKE 1/2 TABLET BY MOUTH DAILY 04/21/16  Yes [name], MD  metFORMIN (GLUCOPHAGE-XR) 500 MG 24 hr tablet TAKE TWO TABLETS TWICE DAILY WITH A MEAL 06/23/16  Yes [name], MD  Multiple Vitamin (MULTIVITAMIN WITH MINERALS) TABS Take 1 tablet by mouth daily.   Yes [provider]  nitroGLYCERIN (NITROSTAT) 0.4 MG SL tablet Place 1 tablet (0.4 mg total) under the tongue every 5 (five) minutes as needed for chest pain. 10/21/15  Yes [name], MD  pantoprazole (PROTONIX) 40 MG tablet Take 1 tablet (40 mg total) by mouth 2 (two) times daily. 05/25/16  Yes [name], MD  quinapril (ACCUPRIL) 20 MG tablet Take 1 tablet (20 mg total) by mouth daily. 05/25/16  Yes [name], MD  spironolactone (ALDACTONE) 25 MG tablet Take 0.5 tablets (12.5 mg total) by mouth daily. 06/24/16  Yes Patrecia Pour, MD  Review of Systems    As above, he has been doing well. He does sometimes have lower extremity swelling at the end of the day. He denies chest pain, palpitations, dyspnea, PND, orthopnea, dizziness, syncope, or early satiety..  All other systems reviewed and are otherwise negative except as noted above.  Physical Exam    VS:  BP 106/64 (BP Location: Left Arm, Patient Position: Sitting, Cuff Size: Normal)   Pulse 68   Ht 5' 8"  (1.727 m)   Wt 241 lb 12 oz (109.7 kg)   BMI 36.76 kg/m  , BMI Body mass index is 36.76 kg/m. GEN: Well nourished, well developed, in no acute distress.  HEENT: normal.  Neck: Supple, no JVD, carotid bruits, or masses. Cardiac: RRR, 2/6 systolic murmur noted throughout, distant heart sounds, no rubs, or gallops. No clubbing, cyanosis, trace bilateral lower extremity edema.  Radials/DP/PT 2+ and equal bilaterally.  Respiratory:  Respirations regular and  unlabored, clear to auscultation bilaterally. GI: Soft, nontender, nondistended, BS + x 4. MS: no deformity or atrophy. Skin: warm and dry, no rash. Neuro:  Strength and sensation are intact. Psych: Normal affect.  Accessory Clinical Findings    ECG - Regular sinus rhythm, 68, left bundle branch block  Assessment & Plan    1.  HFrEF/ischemic cardiomyopathy: Patient was recently admitted with volume overload and respiratory failure. He diuresed well during hospitalization and has been maintained on Lasix and spironolactone as well as beta blocker and ACE inhibitor therapy as an outpatient. He is up 8 pounds on our scale compared to hospital discharge however, he says his weights have been stable on his scale at home with less clothing on. He does have trace lower extremity edema. I will check labs today.  We discussed the importance of daily weights, sodium restriction, medication compliance, and symptom reporting and he verbalizes understanding. I advised that he contact us if weight increases 2 pounds in a 24-hour period.He will follow with Dr. Fletcher Anon approximately 2 weeks.   2. Coronary artery disease: Status post prior RCA stenting. He has known moderate residual LAD disease based on catheterization 2016. He had mild troponin elevation in the setting of CHF and resumed demand ischemia upon last admission. EF was stable at 40% at that time and ischemic evaluation was deferred. He has not had any chest pain or recurrence of dyspnea. Continue medical therapy including aspirin, Plavix, beta blocker, and ACE inhibitor therapy.  3. Essential hypertension: Blood pressure stable today. Next  4. Hyperlipidemia: He remains on statin therapy. LDL was 36 and November 2017.  5. Type 2 diabetes mellitus: [facility]. He remains on glipizide, metformin, and [name]. Hemoglobin A1c was recently found to be 7.4.  6. Disposition: Follow-up basic metabolic panel today. Patient has follow-up already  scheduled in 2-3 weeks.  Murray Hodgkins, NP 07/03/2016, 3:52 PM

## 2016-07-03 NOTE — Patient Instructions (Signed)
Labwork: We will call you with these results.    Follow-Up: Your physician recommends that you schedule a follow-up appointment in: 1 month with Ignacia Bayley NP.   It was a pleasure seeing you today here in the office. Please do not hesitate to give [place] a call back if you have any further questions. [place], BSN

## 2016-07-04 LAB — BASIC METABOLIC PANEL
BUN/Creatinine Ratio: 9 — ABNORMAL LOW (ref 10–24)
BUN: 10 mg/dL (ref 8–27)
CALCIUM: 10 mg/dL (ref 8.6–10.2)
CO2: 20 mmol/L (ref 18–29)
CREATININE: 1.14 mg/dL (ref 0.76–1.27)
Chloride: 99 mmol/L (ref 96–106)
GFR calc Af Amer: 76 mL/min/{1.73_m2} (ref >59)
GFR calc non Af Amer: 66 mL/min/{1.73_m2} (ref >59)
Glucose: 101 mg/dL — ABNORMAL HIGH (ref 65–99)
Potassium: 4.6 mmol/L (ref 3.5–5.2)
Sodium: 140 mmol/L (ref 134–144)

## 2016-07-07 ENCOUNTER — Other Ambulatory Visit: Payer: Self-pay

## 2016-07-07 DIAGNOSIS — I5023 Acute on chronic systolic (congestive) heart failure: Secondary | ICD-10-CM

## 2016-07-09 ENCOUNTER — Other Ambulatory Visit: Payer: Self-pay

## 2016-07-09 NOTE — Telephone Encounter (Signed)
Please advise if ok to refill Spironolactone 25 mg tablet ?

## 2016-07-10 MED ORDER — SPIRONOLACTONE 25 MG PO TABS: 12.5000 mg | ORAL_TABLET | Freq: Every day | ORAL | 1 refills | Status: DC

## 2016-07-15 ENCOUNTER — Ambulatory Visit: Payer: PPO

## 2016-07-21 ENCOUNTER — Encounter: Payer: Self-pay | Admitting: Family Medicine

## 2016-07-21 ENCOUNTER — Ambulatory Visit (INDEPENDENT_AMBULATORY_CARE_PROVIDER_SITE_OTHER): Payer: PPO | Admitting: Family Medicine

## 2016-07-21 DIAGNOSIS — I10 Essential (primary) hypertension: Secondary | ICD-10-CM

## 2016-07-21 DIAGNOSIS — E1142 Type 2 diabetes mellitus with diabetic polyneuropathy: Secondary | ICD-10-CM | POA: Diagnosis not present

## 2016-07-21 DIAGNOSIS — I5022 Chronic systolic (congestive) heart failure: Secondary | ICD-10-CM | POA: Diagnosis not present

## 2016-07-21 MED ORDER — SITAGLIPTIN PHOSPHATE 100 MG PO TABS
50.0000 mg | ORAL_TABLET | Freq: Every day | ORAL | 0 refills | Status: DC
Start: 2016-07-21 — End: 2017-02-24

## 2016-07-21 NOTE — Assessment & Plan Note (Signed)
Slightly above goal though has been well controlled on last several checks. He does not check at home. Encouraged to check at home. He'll see cardiology on Thursday and see what it is then prior to making any changes to medications.

## 2016-07-21 NOTE — Patient Instructions (Addendum)
Nice to see you. We will get you to see our pharmacist to discuss diabetes management and to determine what the next best medicine is for you. I provided you with the sample of the [name]. You will take half a tablet of this once daily by mouth. Please take an extra half a tablet of Lasix daily for the next 2 days. Please see the cardiologist on Thursday as scheduled. If you develop shortness of breath or chest pain please seek medical attention.

## 2016-07-21 NOTE — Assessment & Plan Note (Signed)
Patient previously hospitalized. Has been doing well since then that weight has been trending up. He is up 13 pounds since discharge. Does have some edema. We will have him take an extra 20 mg of Lasix the next 2 days and then see cardiology as scheduled. If he develops any shortness of breath or other symptoms he will be reevaluated.

## 2016-07-21 NOTE — Assessment & Plan Note (Signed)
Slightly above goal on A1c from the hospital. We'll have him continue his current regimen. We will have him see our pharmacist for further management of his diabetes. We'll provide him with a sample of [name] to cover until he is seen by our pharmacist. He'll contact us if he is about to run out of the [place] sample prior to seeing the pharmacist. Patient does report its hard for him to get time off of work and thus I encouraged him to schedule an appointment with the pharmacist on a Monday at his convenience in the next month or two.

## 2016-07-21 NOTE — Progress Notes (Signed)
  [name], MD Phone: [phone]  Devin Hanna Keelin is a 66 y.o. male who presents today for follow-up.  Diabetes: Typically running between 150 and 170 though occasionally up to 200. He is taking glipizide, [name], and metformin. Some polydipsia though no polyuria. No hypoglycemia.  CHF: Patient recently hospitalized for CHF exacerbation. He was diuresed and was down 9 pounds with a dry weight of 233. He notes his scale at home has a holding at around 220 though he is up 13 pounds here. He notes his scales have never matched up with what the physician's office does. He currently takes Lasix, spironolactone, carvedilol, and quinapril. He notes no shortness of breath, orthopnea, or PND.  Hypertension: Not checking at home. Chest pain or shortness of breath. He denies edema.  PMH: Former smoker   ROS see history of present illness  Objective  Physical Exam Vitals:   07/21/16 0759  BP: (!) 152/82  Pulse: 64  Temp: 98 F (36.7 C)    BP Readings from Last 3 Encounters:  07/21/16 (!) 152/82  07/03/16 106/64  06/23/16 (!) 112/51   Wt Readings from Last 3 Encounters:  07/21/16 246 lb 9.6 oz (111.9 kg)  07/03/16 241 lb 12 oz (109.7 kg)  06/23/16 233 lb 11.2 oz (106 kg)    Physical Exam  Constitutional: No distress.  Cardiovascular: Normal rate, regular rhythm and normal heart sounds.   Pulmonary/Chest: Effort normal and breath sounds normal.  Musculoskeletal: He exhibits edema (trace to 1+ pitting bilateral lower extremities).  Neurological: He is alert. Gait normal.  Skin: Skin is warm and dry. He is not diaphoretic.     Assessment/Plan: Please see individual problem list.  HTN (hypertension) Slightly above goal though has been well controlled on last several checks. He does not check at home. Encouraged to check at home. He'll see cardiology on Thursday and see what it is then prior to making any changes to medications.  Chronic systolic heart failure  [facility]) Patient previously hospitalized. Has been doing well since then that weight has been trending up. He is up 13 pounds since discharge. Does have some edema. We will have him take an extra 20 mg of Lasix the next 2 days and then see cardiology as scheduled. If he develops any shortness of breath or other symptoms he will be reevaluated.  DM type 2 (diabetes mellitus, type 2) ([place]) Slightly above goal on A1c from the hospital. We'll have him continue his current regimen. We will have him see our pharmacist for further management of his diabetes. We'll provide him with a sample of [name] to cover until he is seen by our pharmacist. He'll contact us if he is about to run out of the [place] sample prior to seeing the pharmacist. Patient does report its hard for him to get time off of work and thus I encouraged him to schedule an appointment with the pharmacist on a Monday at his convenience in the next month or two.   No orders of the defined types were placed in this encounter.   Meds ordered this encounter  Medications  . sitaGLIPtin ([name]) 100 MG tablet    Sig: Take 0.5 tablets (50 mg total) by mouth daily.    Dispense:  14 tablet    Refill:  0   [name], MD [place]

## 2016-07-23 ENCOUNTER — Ambulatory Visit (INDEPENDENT_AMBULATORY_CARE_PROVIDER_SITE_OTHER): Payer: PPO | Admitting: Cardiovascular Disease

## 2016-07-23 ENCOUNTER — Encounter: Payer: Self-pay | Admitting: Cardiovascular Disease

## 2016-07-23 VITALS — BP 124/72 | HR 63 | Ht 67.0 in | Wt 243.0 lb

## 2016-07-23 DIAGNOSIS — I2510 Atherosclerotic heart disease of native coronary artery without angina pectoris: Secondary | ICD-10-CM

## 2016-07-23 DIAGNOSIS — I10 Essential (primary) hypertension: Secondary | ICD-10-CM

## 2016-07-23 DIAGNOSIS — I5022 Chronic systolic (congestive) heart failure: Secondary | ICD-10-CM

## 2016-07-23 DIAGNOSIS — E7800 Pure hypercholesterolemia, unspecified: Secondary | ICD-10-CM

## 2016-07-23 MED ORDER — FUROSEMIDE 20 MG PO TABS: ORAL_TABLET | ORAL | 3 refills | Status: DC

## 2016-07-23 NOTE — Patient Instructions (Signed)
Medication Instructions:  Your physician has recommended you make the following change in your medication:  DECREASE lasix to 68m (2 tablets) in the morning and 221m(1 tablet) in the afternoon.    [facility]  Testing/Procedures: none  Follow-Up: Your physician recommends that you schedule a follow-up appointment in: 3 months with Dr. ArFletcher Anon   Any Other Special Instructions Will Be Listed Below (If Applicable).     If you need a refill on your cardiac medications before your next appointment, please call your pharmacy.

## 2016-07-23 NOTE — Progress Notes (Signed)
Cardiology Office Note   Date:  07/23/2016   ID:  [name], DOB 05-16-1950, MRN [id number]  PCP:  [name], MD  Cardiologist:   [name], MD   Chief Complaint  Patient presents with  . other    6 month follow up. Meds reviewed by the pt. verbally. Pt. c/o left leg swelling; has increased his fluid pill to an extra half tablet as needed per Dr. [name] request.       History of Present Illness: Devin Hanna is a 66 y.o. male who presents for a followup visit regarding coronary artery disease and chronic systolic heart failure.  He has known CAD with past RCA stents and prior MI, GERD, DM, HTN and HLD.  He presented in February of 2015 with CHF and NSTEMI. cardiac catheterization showed 99% in-stent restenosis in the right coronary artery which was heavily calcified. There was also 60-70% distal LAD stenosis. EF was 30-35% by echo. He did have left bundle branch block.  I performed successful  rotational atherectomy, cutting balloon angioplasty and DES to the proximal RCA .  Echocardiogram in 09/2013 showed EF of 45-50% with mild MR.   He was hospitalized in May of this year with acute on chronic systolic heart failure. He admits to excessive salt intake at that time. Echocardiogram showed an EF of 35-40%. He has been doing reasonably well with overall improvement. However, his weight is going up again with worsening leg edema. No chest pain. He has improved his diet significantly.    Past Medical History:  Diagnosis Date  . Acute respiratory failure with hypoxia ([place]) 06/2016  . CAD (coronary artery disease)    a. Remote PCI of the RCA;  b. 03/2013 Inf STEMI/PCI: LAD 60-70d, RCA 99p ISR(rota/CBA/DES);  c. 09/2014 MV: inf and infsept infarct, EF 35-40%;  d. 09/2014 Cath: patent RCA stent, stable LAD dzs, EF 40%-->Med rx.  . Diabetes mellitus    sees Dr. Hardin Negus in [place]  . GERD (gastroesophageal reflux disease)   . HFrEF (heart failure with reduced  ejection fraction) ([place])   . Hyperlipidemia   . Ischemic cardiomyopathy    a. 09/2014 EF 40% by V gram;  b. 06/2016 Echo: EF 40, diff HK, Gr1 DD, inf HK, no mural thrombus. Mild MR, mildly dil LA, mildly dil RV.   . LBBB (left bundle branch block)    Old  . Morbid obesity ([place])   . Peripheral neuropathy   . Vertigo     Past Surgical History:  Procedure Laterality Date  . CARDIAC CATHETERIZATION N/A 10/03/2014   Procedure: Left Heart Cath and Coronary Angiography;  Surgeon: [name], MD;  Location: [place];  Service: Cardiovascular;  Laterality: N/A;  . CIRCUMCISION N/A 07/22/2015   Procedure: CIRCUMCISION ADULT;  Surgeon: Hollice Espy, MD;  Location: [facility];  Service: Urology;  Laterality: N/A;  . CORONARY STENT PLACEMENT    . ESOPHAGOGASTRODUODENOSCOPY (EGD) WITH PROPOFOL N/A 09/12/2014   Procedure: ESOPHAGOGASTRODUODENOSCOPY (EGD) WITH PROPOFOL;  Surgeon: Ronald Lobo, MD;  Location: [facility] ENDOSCOPY;  Service: Endoscopy;  Laterality: N/A;  . EXTERNAL FIXATION LEG Left 04/03/2012   Procedure: EXTERNAL FIXATION LEG;  Surgeon: Wylene Simmer, MD;  Location: [place];  Service: Orthopedics;  Laterality: Left;  . EXTERNAL FIXATION REMOVAL Left 04/14/2012   Procedure: REMOVAL EXTERNAL FIXATION LEG;  Surgeon: Wylene Simmer, MD;  Location: [place];  Service: Orthopedics;  Laterality: Left;  . IRRIGATION AND DEBRIDEMENT KNEE Left 04/03/2012  Procedure: IRRIGATION AND DEBRIDEMENT KNEE;  Surgeon: Wylene Simmer, MD;  Location: [place];  Service: Orthopedics;  Laterality: Left;  . LEFT HEART CATHETERIZATION WITH CORONARY ANGIOGRAM N/A 04/05/2013   Procedure: LEFT HEART CATHETERIZATION WITH CORONARY ANGIOGRAM;  Surgeon: [name], MD;  Location: [place] CATH LAB;  Service: Cardiovascular;  Laterality: N/A;  . MASS EXCISION N/A 07/22/2015   Procedure: EXCISION PENILE MASS;  Surgeon: Hollice Espy, MD;  Location: [facility];  Service: Urology;  Laterality: N/A;  . ORIF TIBIA PLATEAU Left 04/14/2012    Procedure: OPEN REDUCTION INTERNAL FIXATION (ORIF) TIBIAL PLATEAU;  Surgeon: Wylene Simmer, MD;  Location: [place];  Service: Orthopedics;  Laterality: Left;  . PERCUTANEOUS CORONARY ROTOBLATOR INTERVENTION (PCI-R) N/A 04/06/2013   Procedure: PERCUTANEOUS CORONARY ROTOBLATOR INTERVENTION (PCI-R);  Surgeon: [name], MD;  Location: [facility] CATH LAB;  Service: Cardiovascular;  Laterality: N/A;     Current Outpatient Prescriptions  Medication Sig Dispense Refill  . atorvastatin (LIPITOR) 40 MG tablet Take 1 tablet (40 mg total) by mouth daily. 90 tablet 3  . carvedilol (COREG) 12.5 MG tablet Take 0.5 tablets (6.25 mg total) by mouth 2 (two) times daily with a meal.    . Cinnamon 500 MG capsule Take 1,000 mg by mouth 2 (two) times daily with a meal.    . clopidogrel (PLAVIX) 75 MG tablet Take 1 tablet (75 mg total) by mouth daily with breakfast. 90 tablet 0  . ferrous sulfate (FERROUSUL) 325 (65 FE) MG tablet Take 1 tablet (325 mg total) by mouth 2 (two) times daily with a meal. 60 tablet 5  . furosemide (LASIX) 40 MG tablet Take 1 tablet (40 mg total) by mouth daily. 90 tablet 0  . gabapentin (NEURONTIN) 100 MG capsule Take 2 capsules (200 mg total) by mouth 3 (three) times daily. 270 capsule 3  . GLIPIZIDE XL 10 MG 24 hr tablet TAKE ONE TABLET BY MOUTH ONCE DAILY WITHBREAKFAST 90 tablet 2  . metFORMIN (GLUCOPHAGE-XR) 500 MG 24 hr tablet TAKE TWO TABLETS TWICE DAILY WITH A MEAL 360 tablet 1  . Multiple Vitamin (MULTIVITAMIN WITH MINERALS) TABS Take 1 tablet by mouth daily.    . nitroGLYCERIN (NITROSTAT) 0.4 MG SL tablet Place 1 tablet (0.4 mg total) under the tongue every 5 (five) minutes as needed for chest pain. 25 tablet 3  . pantoprazole (PROTONIX) 40 MG tablet Take 1 tablet (40 mg total) by mouth 2 (two) times daily. 60 tablet 3  . quinapril (ACCUPRIL) 20 MG tablet Take 1 tablet (20 mg total) by mouth daily. 30 tablet 3  . sitaGLIPtin ([name]) 100 MG tablet Take 0.5 tablets (50 mg total) by  mouth daily. 14 tablet 0  . [START ON 08/09/2016] spironolactone (ALDACTONE) 25 MG tablet Take 0.5 tablets (12.5 mg total) by mouth daily. 30 tablet 1   No current facility-administered medications for this visit.     Allergies:   Patient has no known allergies.    Social History:  The patient  reports that he quit smoking about 21 years ago. He has quit using smokeless tobacco. He reports that he does not drink alcohol or use drugs.   Family History:  The patient's family history includes Breast cancer in his sister; Coronary artery disease in his brother and father; Lung cancer in his brother; Prostate cancer in his brother; Stroke in his sister.    ROS:  Please see the history of present illness.   Otherwise, review of systems are positive for none.  All other systems are reviewed and negative.    PHYSICAL EXAM: VS:  BP 124/72 (BP Location: Left Arm, Patient Position: Sitting, Cuff Size: Normal)   Pulse 63   Ht 5' 7"  (1.702 m)   Wt 243 lb (110.2 kg)   BMI 38.06 kg/m  , BMI Body mass index is 38.06 kg/m. GEN: Well nourished, well developed, in no acute distress  HEENT: normal  Neck: Mild JVD, carotid bruits, or masses Cardiac: RRR; no  rubs, or gallops. . 2/6 systolic ejection murmur in the aortic area which is early peaking . Mild leg edema Respiratory:  clear to auscultation bilaterally, normal work of breathing GI: soft, nontender, nondistended, + BS MS: no deformity or atrophy  Skin: warm and dry, no rash Neuro:  Strength and sensation are intact Psych: euthymic mood, full affect   EKG:  EKG is ordered today. The ekg ordered today demonstrates normal sinus rhythm with left bundle branch block and first-degree AV block.   Recent Labs: 06/19/2016: B Natriuretic Peptide 339.6; Magnesium 1.8 06/20/2016: ALT 46 06/21/2016: Hemoglobin 14.8; Platelets 188 07/03/2016: BUN 10; Creatinine, Ser 1.14; Potassium 4.6; Sodium 140    Lipid Panel    Component Value Date/Time   CHOL  98 01/17/2016 0953   CHOL 103 06/07/2014 0824   TRIG 146.0 01/17/2016 0953   HDL 32.80 (L) 01/17/2016 0953   HDL 38 (L) 06/07/2014 0824   CHOLHDL 3 01/17/2016 0953   VLDL 29.2 01/17/2016 0953   LDLCALC 36 01/17/2016 0953   LDLCALC 41 06/07/2014 0824      Wt Readings from Last 3 Encounters:  07/23/16 243 lb (110.2 kg)  07/21/16 246 lb 9.6 oz (111.9 kg)  07/03/16 241 lb 12 oz (109.7 kg)        ASSESSMENT AND PLAN:  1.  Coronary artery disease involving native coronary arteries without angina:: He is doing reasonably well with no anginal symptoms. Continue medical therapy. He is to stay on Plavix without aspirin due to prior gastric ulcers.  2. Chronic systolic heart failure: He appears to be mildly volume overloaded. I elected to change Lasix to 40 mg in the morning and 20 mg in the afternoon. Check basic metabolic profile in one week. I will consider switching ACE inhibitor to Entresto.  3. Essential hypertension: Blood pressure is now well controlled  on current medications.  4. Hyperlipidemia: Lipid profile last year showed an LDL of 36.   Disposition:   FU with me in 3 months  Signed,  [name], MD  07/23/2016 9:28 AM    [place]

## 2016-07-30 ENCOUNTER — Ambulatory Visit: Payer: PPO | Admitting: Nurse Practitioner

## 2016-07-30 ENCOUNTER — Other Ambulatory Visit
Admission: RE | Admit: 2016-07-30 | Discharge: 2016-07-30 | Disposition: A | Discharge status: Home / Self Care | Payer: PPO | Source: Ambulatory Visit | Attending: Cardiovascular Disease | Admitting: Cardiovascular Disease

## 2016-07-30 DIAGNOSIS — I10 Essential (primary) hypertension: Secondary | ICD-10-CM | POA: Diagnosis not present

## 2016-07-30 DIAGNOSIS — I5022 Chronic systolic (congestive) heart failure: Secondary | ICD-10-CM | POA: Insufficient documentation

## 2016-07-30 LAB — BASIC METABOLIC PANEL
Anion gap: 10 (ref 5–15)
BUN: 17 mg/dL (ref 6–20)
CO2: 26 mmol/L (ref 22–32)
CREATININE: 0.84 mg/dL (ref 0.61–1.24)
Calcium: 9.3 mg/dL (ref 8.9–10.3)
Chloride: 99 mmol/L — ABNORMAL LOW (ref 101–111)
GFR calc Af Amer: >60 mL/min (ref >60)
GLUCOSE: 202 mg/dL — AB (ref 65–99)
Potassium: 4.2 mmol/L (ref 3.5–5.1)
SODIUM: 135 mmol/L (ref 135–145)

## 2016-08-04 ENCOUNTER — Telehealth: Payer: Self-pay | Admitting: Cardiovascular Disease

## 2016-08-04 NOTE — Telephone Encounter (Signed)
Left voicemail message to call back  

## 2016-08-04 NOTE — Telephone Encounter (Signed)
Pt calling back about lab results Please call back

## 2016-08-05 NOTE — Telephone Encounter (Signed)
See results note. 

## 2016-08-07 ENCOUNTER — Telehealth: Payer: Self-pay | Admitting: Family Medicine

## 2016-08-07 NOTE — Telephone Encounter (Signed)
Pt daughter called wanting to know if Dr [name] would write a note to dismiss him from jury duty? Please advise?  Call [place] @ [phone]. Thank you!

## 2016-08-07 NOTE — Telephone Encounter (Signed)
Please call pt's daughter [name] [phone]

## 2016-08-07 NOTE — Telephone Encounter (Signed)
Notified daughter we are not legally able to do this.

## 2016-08-07 NOTE — Telephone Encounter (Signed)
Left message to return call 

## 2016-10-07 ENCOUNTER — Other Ambulatory Visit: Payer: Self-pay | Admitting: Cardiovascular Disease

## 2016-10-07 ENCOUNTER — Other Ambulatory Visit: Payer: Self-pay | Admitting: Family Medicine

## 2016-10-09 ENCOUNTER — Telehealth: Payer: Self-pay | Admitting: Family Medicine

## 2016-10-09 NOTE — Telephone Encounter (Signed)
Left pt message asking to call Ebony Hail back directly at [phone] to schedule AWV. Thanks!  *NOTE* No hx of AWV

## 2016-10-12 ENCOUNTER — Other Ambulatory Visit: Payer: Self-pay | Admitting: Cardiovascular Disease

## 2016-10-12 DIAGNOSIS — Z9889 Other specified postprocedural states: Secondary | ICD-10-CM

## 2016-10-16 ENCOUNTER — Other Ambulatory Visit: Payer: Self-pay | Admitting: Nurse Practitioner

## 2016-10-22 ENCOUNTER — Ambulatory Visit (INDEPENDENT_AMBULATORY_CARE_PROVIDER_SITE_OTHER): Payer: PPO | Admitting: Cardiovascular Disease

## 2016-10-22 ENCOUNTER — Encounter: Payer: Self-pay | Admitting: Cardiovascular Disease

## 2016-10-22 VITALS — BP 120/70 | HR 72 | Ht 67.0 in | Wt 248.2 lb

## 2016-10-22 DIAGNOSIS — I2510 Atherosclerotic heart disease of native coronary artery without angina pectoris: Secondary | ICD-10-CM | POA: Diagnosis not present

## 2016-10-22 DIAGNOSIS — E7800 Pure hypercholesterolemia, unspecified: Secondary | ICD-10-CM

## 2016-10-22 DIAGNOSIS — I10 Essential (primary) hypertension: Secondary | ICD-10-CM | POA: Diagnosis not present

## 2016-10-22 DIAGNOSIS — I5022 Chronic systolic (congestive) heart failure: Secondary | ICD-10-CM | POA: Diagnosis not present

## 2016-10-22 NOTE — Progress Notes (Signed)
Cardiology Office Note   Date:  10/22/2016   ID:  Devin Hanna, DOB 1950/09/11, MRN [id number]  PCP:  Devin Haven, MD  Cardiologist:   Devin Sacramento, MD   Chief Complaint  Patient presents with  . other    3 month follow up. Meds reviewed by the pt. verbally. "doing well."        History of Present Illness: Devin Hanna is a 66 y.o. male who presents for a followup visit regarding coronary artery disease and chronic systolic heart failure.  He has known CAD with past RCA stents and prior MI, GERD, DM, HTN and HLD.  Most recent hospitalization was in May of this year for acute on chronic systolic heart failure. Echocardiogram showed an EF of 35-40%.  During last visit, I increased furosemide to 40 mg in the morning and 20 mg in the afternoon. He has been doing reasonably well and denies any chest pain, shortness of breath or leg edema. He has been taking all his medications on a regular basis. Blood pressure has been controlled.   Past Medical History:  Diagnosis Date  . Acute respiratory failure with hypoxia ([place]) 06/2016  . CAD (coronary artery disease)    a. Remote PCI of the RCA;  b. 03/2013 Inf STEMI/PCI: LAD 60-70d, RCA 99p ISR(rota/CBA/DES);  c. 09/2014 MV: inf and infsept infarct, EF 35-40%;  d. 09/2014 Cath: patent RCA stent, stable LAD dzs, EF 40%-->Med rx.  . Diabetes mellitus    sees Dr. Hardin Negus in [place]  . GERD (gastroesophageal reflux disease)   . HFrEF (heart failure with reduced ejection fraction) ([place])   . Hyperlipidemia   . Ischemic cardiomyopathy    a. 09/2014 EF 40% by V gram;  b. 06/2016 Echo: EF 40, diff HK, Gr1 DD, inf HK, no mural thrombus. Mild MR, mildly dil LA, mildly dil RV.   . LBBB (left bundle branch block)    Old  . Morbid obesity ([place])   . Peripheral neuropathy   . Vertigo     Past Surgical History:  Procedure Laterality Date  . CARDIAC CATHETERIZATION N/A 10/03/2014   Procedure: Left Heart Cath and Coronary Angiography;   Surgeon: Devin Hampshire, MD;  Location: [place] [facility];  Service: Cardiovascular;  Laterality: N/A;  . CIRCUMCISION N/A 07/22/2015   Procedure: CIRCUMCISION ADULT;  Surgeon: Devin Espy, MD;  Location: [facility];  Service: Urology;  Laterality: N/A;  . CORONARY STENT PLACEMENT    . ESOPHAGOGASTRODUODENOSCOPY (EGD) WITH PROPOFOL N/A 09/12/2014   Procedure: ESOPHAGOGASTRODUODENOSCOPY (EGD) WITH PROPOFOL;  Surgeon: Devin Lobo, MD;  Location: [facility] ENDOSCOPY;  Service: Endoscopy;  Laterality: N/A;  . EXTERNAL FIXATION LEG Left 04/03/2012   Procedure: EXTERNAL FIXATION LEG;  Surgeon: Devin Simmer, MD;  Location: [place];  Service: Orthopedics;  Laterality: Left;  . EXTERNAL FIXATION REMOVAL Left 04/14/2012   Procedure: REMOVAL EXTERNAL FIXATION LEG;  Surgeon: Devin Simmer, MD;  Location: [place];  Service: Orthopedics;  Laterality: Left;  . IRRIGATION AND DEBRIDEMENT KNEE Left 04/03/2012   Procedure: IRRIGATION AND DEBRIDEMENT KNEE;  Surgeon: Devin Simmer, MD;  Location: [place];  Service: Orthopedics;  Laterality: Left;  . LEFT HEART CATHETERIZATION WITH CORONARY ANGIOGRAM N/A 04/05/2013   Procedure: LEFT HEART CATHETERIZATION WITH CORONARY ANGIOGRAM;  Surgeon: Devin Hampshire, MD;  Location: [place] CATH LAB;  Service: Cardiovascular;  Laterality: N/A;  . MASS EXCISION N/A 07/22/2015   Procedure: EXCISION PENILE MASS;  Surgeon: Devin Espy, MD;  Location: [facility] ORS;  Service: Urology;  Laterality: N/A;  . ORIF TIBIA PLATEAU Left 04/14/2012   Procedure: OPEN REDUCTION INTERNAL FIXATION (ORIF) TIBIAL PLATEAU;  Surgeon: Devin Simmer, MD;  Location: [place];  Service: Orthopedics;  Laterality: Left;  . PERCUTANEOUS CORONARY ROTOBLATOR INTERVENTION (PCI-R) N/A 04/06/2013   Procedure: PERCUTANEOUS CORONARY ROTOBLATOR INTERVENTION (PCI-R);  Surgeon: Devin Hampshire, MD;  Location: [facility] CATH LAB;  Service: Cardiovascular;  Laterality: N/A;     Current Outpatient Prescriptions  Medication Sig Dispense Refill  .  atorvastatin (LIPITOR) 40 MG tablet Take 1 tablet (40 mg total) by mouth daily. 90 tablet 3  . carvedilol (COREG) 12.5 MG tablet Take 0.5 tablets (6.25 mg total) by mouth 2 (two) times daily with a meal.    . Cinnamon 500 MG capsule Take 1,000 mg by mouth 2 (two) times daily with a meal.    . clopidogrel (PLAVIX) 75 MG tablet TAKE ONE TABLET BY MOUTH EVERY DAY WITH BREAKFAST 90 tablet 3  . ferrous sulfate (FERROUSUL) 325 (65 FE) MG tablet Take 1 tablet (325 mg total) by mouth 2 (two) times daily with a meal. 60 tablet 5  . furosemide (LASIX) 20 MG tablet Take 85m in the morning and 265min the evening. 90 tablet 3  . furosemide (LASIX) 40 MG tablet TAKE ONE TABLET BY MOUTH EVERY DAY 90 tablet 3  . gabapentin (NEURONTIN) 100 MG capsule Take 2 capsules (200 mg total) by mouth 3 (three) times daily. 270 capsule 3  . GLIPIZIDE XL 10 MG 24 hr tablet TAKE ONE TABLET EVERY DAY WITH BREAKFAST 90 tablet 1  . metFORMIN (GLUCOPHAGE-XR) 500 MG 24 hr tablet TAKE TWO TABLETS BY MOUTH TWICE DAILY WITH A MEAL 360 tablet 1  . Multiple Vitamin (MULTIVITAMIN WITH MINERALS) TABS Take 1 tablet by mouth daily.    . nitroGLYCERIN (NITROSTAT) 0.4 MG SL tablet Place 1 tablet (0.4 mg total) under the tongue every 5 (five) minutes as needed for chest pain. 25 tablet 3  . pantoprazole (PROTONIX) 40 MG tablet TAKE ONE TABLET BY MOUTH TWICE DAILY 90 tablet 3  . quinapril (ACCUPRIL) 20 MG tablet TAKE ONE TABLET BY MOUTH EVERY DAY 90 tablet 3  . sitaGLIPtin ([name]) 100 MG tablet Take 0.5 tablets (50 mg total) by mouth daily. 14 tablet 0  . spironolactone (ALDACTONE) 25 MG tablet TAKE 1/2 TABLET EVERY DAY 30 tablet 3   No current facility-administered medications for this visit.     Allergies:   Patient has no known allergies.    Social History:  The patient  reports that he quit smoking about 21 years ago. He has quit using smokeless tobacco. He reports that he does not drink alcohol or use drugs.   Family History:  The  patient's family history includes Breast cancer in his sister; Coronary artery disease in his brother and father; Lung cancer in his brother; Prostate cancer in his brother; Stroke in his sister.    ROS:  Please see the history of present illness.   Otherwise, review of systems are positive for none.   All other systems are reviewed and negative.    PHYSICAL EXAM: VS:  BP 120/70 (BP Location: Left Arm, Patient Position: Sitting, Cuff Size: Normal)   Pulse 72   Ht 5' 7"  (1.702 m)   Wt 248 lb 4 oz (112.6 kg)   BMI 38.88 kg/m  , BMI Body mass index is 38.88 kg/m. GEN: Well nourished, well developed, in no acute distress  HEENT: normal  Neck: Mild  JVD, carotid bruits, or masses Cardiac: RRR; no  rubs, or gallops. . 2/6 systolic ejection murmur in the aortic area which is early peaking . Trace leg edema Respiratory:  clear to auscultation bilaterally, normal work of breathing GI: soft, nontender, nondistended, + BS MS: no deformity or atrophy  Skin: warm and dry, no rash Neuro:  Strength and sensation are intact Psych: euthymic mood, full affect   EKG:  EKG is ordered today. The ekg ordered today demonstrates normal sinus rhythm with left bundle branch block and occasional PVCs   Recent Labs: 06/19/2016: B Natriuretic Peptide 339.6; Magnesium 1.8 06/20/2016: ALT 46 06/21/2016: Hemoglobin 14.8; Platelets 188 07/30/2016: BUN 17; Creatinine, Ser 0.84; Potassium 4.2; Sodium 135    Lipid Panel    Component Value Date/Time   CHOL 98 01/17/2016 0953   CHOL 103 06/07/2014 0824   TRIG 146.0 01/17/2016 0953   HDL 32.80 (L) 01/17/2016 0953   HDL 38 (L) 06/07/2014 0824   CHOLHDL 3 01/17/2016 0953   VLDL 29.2 01/17/2016 0953   LDLCALC 36 01/17/2016 0953   LDLCALC 41 06/07/2014 0824      Wt Readings from Last 3 Encounters:  10/22/16 248 lb 4 oz (112.6 kg)  07/23/16 243 lb (110.2 kg)  07/21/16 246 lb 9.6 oz (111.9 kg)        ASSESSMENT AND PLAN:  1.  Coronary artery disease  involving native coronary arteries without angina:: He is doing reasonably well with no anginal symptoms. Continue medical therapy. He is to stay on Plavix without aspirin due to prior gastric ulcers.  2. Chronic systolic heart failure:  Appears to be euvolemic on current dose of furosemide. I might consider switching him to [facility] in the future although he is concerned about cost of brand name medications.  3. Essential hypertension: Blood pressure is now well controlled  on current medications.  4. Hyperlipidemia: Continue treatment with atorvastatin. LDL has been consistently below 70.   Disposition:   FU with me in 6 months  Signed,  Devin Sacramento, MD  10/22/2016 11:43 AM    [place]

## 2016-10-22 NOTE — Patient Instructions (Signed)
Medication Instructions: Continue same medications.   Labwork: None.   Procedures/Testing: None.   Follow-Up: 6 months with Dr. Arida.   Any Additional Special Instructions Will Be Listed Below (If Applicable).     If you need a refill on your cardiac medications before your next appointment, please call your pharmacy.   

## 2016-10-23 ENCOUNTER — Ambulatory Visit: Payer: PPO | Admitting: Cardiovascular Disease

## 2016-10-23 ENCOUNTER — Other Ambulatory Visit: Payer: Self-pay | Admitting: Cardiovascular Disease

## 2016-10-27 ENCOUNTER — Ambulatory Visit (INDEPENDENT_AMBULATORY_CARE_PROVIDER_SITE_OTHER): Payer: PPO | Admitting: Family Medicine

## 2016-10-27 ENCOUNTER — Telehealth: Payer: Self-pay | Admitting: Pharmacist

## 2016-10-27 ENCOUNTER — Encounter: Payer: Self-pay | Admitting: Family Medicine

## 2016-10-27 VITALS — BP 134/78 | HR 67 | Temp 97.9°F | Wt 245.8 lb

## 2016-10-27 DIAGNOSIS — I10 Essential (primary) hypertension: Secondary | ICD-10-CM | POA: Diagnosis not present

## 2016-10-27 DIAGNOSIS — Z23 Encounter for immunization: Secondary | ICD-10-CM

## 2016-10-27 DIAGNOSIS — E1142 Type 2 diabetes mellitus with diabetic polyneuropathy: Secondary | ICD-10-CM

## 2016-10-27 DIAGNOSIS — C609 Malignant neoplasm of penis, unspecified: Secondary | ICD-10-CM | POA: Diagnosis not present

## 2016-10-27 DIAGNOSIS — I2510 Atherosclerotic heart disease of native coronary artery without angina pectoris: Secondary | ICD-10-CM | POA: Diagnosis not present

## 2016-10-27 LAB — POCT GLYCOSYLATED HEMOGLOBIN (HGB A1C): Hemoglobin A1C: 7.5

## 2016-10-27 MED ORDER — NITROGLYCERIN 0.4 MG SL SUBL
0.4000 mg | SUBLINGUAL_TABLET | SUBLINGUAL | 3 refills | Status: DC | PRN
Start: 2016-10-27 — End: 2020-05-16

## 2016-10-27 NOTE — Telephone Encounter (Signed)
Opened in error in wrong context

## 2016-10-27 NOTE — Progress Notes (Signed)
Asked by Dr. [name] to assess patient's options for medication assistance given cost of [name].   Called patient's pharmacy to determine copay of Jardiance - Patient paid ~$45 last month for [name], currently filled partial-month's supply for ~$33 copayment. Does not appear that patient is in donut hole.   Called patient to assess eligibility for patient assistance for [name] via [name]'s patient assistance program. Patient is not sure whether he will be under the income cut off as he has income from social security and income from continued employment. Patient is to return my phone call when he finds his 2017 tax documents.   Can consider rx'ing januvia and metformin into a combination tablet (Janumet - tier 3) to eliminate one copay.   Patient has [employer] Medicare plan - [name] is tier 3 on that plan. [name] and Victoza also covered on plan however tier 3.   Will follow up with patient once he returns my phone call regarding income.   Carlean Jews, Pharm.D. PGY2 Ambulatory [facility] Pharmacy Resident Phone: [phone]

## 2016-10-27 NOTE — Patient Instructions (Signed)
Nice to see you. Please monitor for any lesions on your penis or masses in your groin or elsewhere. If those occur please let us know immediately. We will contact you with your A1c.

## 2016-10-27 NOTE — Assessment & Plan Note (Signed)
Patient with history of penile cancer. Has not followed up with urology since right after his surgery. He prefers not to see them again. Exam today benign. Advised to monitor for any lesions or masses.

## 2016-10-27 NOTE — Assessment & Plan Note (Addendum)
CBGs well-controlled. A1c uncontrolled at 7.5. Patient notes the [name] is quite expensive and cost $45 a month. I will send a message to our clinical pharmacist to see if he can qualify for patient assistance for this. He was intolerant of Jardiance previously. Discussed the next step would be an injectable medicine such as [name] though he is unsure if he would be able to give himself an injection. He'll contact an ophthalmologist for an appointment.

## 2016-10-27 NOTE — Assessment & Plan Note (Signed)
Stable. Asymptomatic. Continue to follow with cardiology.

## 2016-10-27 NOTE — Assessment & Plan Note (Signed)
Has been well controlled. He'll continue his current medications. He'll be due for lab work at his next visit.

## 2016-10-27 NOTE — Progress Notes (Signed)
  [name], MD Phone: [phone]  Devin Hanna is a 65 y.o. male who presents today for f/u.  DIABETES Disease Monitoring: Blood Sugar ranges-130-145 Polyuria/phagia/dipsia- no      Optho- due, wants to schedule his own visit Medications: Compliance- taking glipizide, metformin, januvia Hypoglycemic symptoms- no  HYPERTENSION  Disease Monitoring  Home BP Monitoring not checking Chest pain- no    Dyspnea- no Medications  Compliance-  Taking spiro, coreg, quinapril, lasix.  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 They recommended six-month follow-up the patient did not keep this. He prefers not to see them. He's not had any lesions on his penis. He has had no masses in his groin.  CAD: Taking Plavix and Lipitor daily. Has never had to use nitroglycerin. His nitroglycerin has expired.    PMH: former smoker   ROS see history of present illness  Objective  Physical Exam Vitals:   10/27/16 0800  BP: 134/78  Pulse: 67  Temp: 97.9 F (36.6 C)  SpO2: 98%    BP Readings from Last 3 Encounters:  10/27/16 134/78  10/22/16 120/70  07/23/16 124/72   Wt Readings from Last 3 Encounters:  10/27/16 245 lb 12.8 oz (111.5 kg)  10/22/16 248 lb 4 oz (112.6 kg)  07/23/16 243 lb (110.2 kg)    Physical Exam  Constitutional: No distress.  Cardiovascular: Normal rate, regular rhythm and normal heart sounds.   Pulmonary/Chest: Effort normal and breath sounds normal.  Abdominal: Soft. Bowel sounds are normal. He exhibits no distension. There is no tenderness.  Genitourinary:  Genitourinary Comments: Uncircumcised penis with no lesions noted on the foreskin or the rest of the penis, normal scrotum and testicles, normal vas deferens, normal epididymis, no inguinal hernias, no inguinal or suprapubic lymphadenopathy  Musculoskeletal: He exhibits no edema.  Neurological: He is alert. Gait normal.  Skin: Skin is warm and dry. He is not  diaphoretic.     Assessment/Plan: Please see individual problem list.  CAD (coronary artery disease) Stable. Asymptomatic. Continue to follow with cardiology.  HTN (hypertension) Has been well controlled. He'll continue his current medications. He'll be due for lab work at his next visit.  DM type 2 (diabetes mellitus, type 2) (HCC) CBGs well-controlled. A1c uncontrolled at 7.5. Patient notes the [name] is quite expensive and cost $45 a month. I will send a message to our clinical pharmacist to see if he can qualify for patient assistance for this. He was intolerant of Jardiance previously. Discussed the next step would be an injectable medicine such as [name] though he is unsure if he would be able to give himself an injection. He'll contact an ophthalmologist for an appointment.  Penile cancer [facility]) Patient with history of penile cancer. Has not followed up with urology since right after his surgery. He prefers not to see them again. Exam today benign. Advised to monitor for any lesions or masses.   Orders Placed This Encounter  Procedures  . Flu vaccine HIGH DOSE PF  . POCT HgB A1C    Meds ordered this encounter  Medications  . nitroGLYCERIN (NITROSTAT) 0.4 MG SL tablet    Sig: Place 1 tablet (0.4 mg total) under the tongue every 5 (five) minutes as needed for chest pain.    Dispense:  25 tablet    Refill:  [place], MD [place]

## 2016-10-30 ENCOUNTER — Telehealth: Payer: Self-pay | Admitting: Family Medicine

## 2016-10-30 NOTE — Telephone Encounter (Signed)
Called pt to schedule AWV. Lvm for pt to call Ebony Hail at [phone] to schedule appt. SF

## 2016-11-06 ENCOUNTER — Other Ambulatory Visit: Payer: Self-pay | Admitting: Nurse Practitioner

## 2016-11-06 ENCOUNTER — Other Ambulatory Visit: Payer: Self-pay | Admitting: Family Medicine

## 2016-11-18 NOTE — Telephone Encounter (Signed)
Left pt message asking to call Ebony Hail back directly at [phone] to schedule AWV. Thanks!  *NOTE* No hx of AWV

## 2017-01-04 ENCOUNTER — Other Ambulatory Visit: Payer: Self-pay | Admitting: Cardiovascular Disease

## 2017-01-04 ENCOUNTER — Other Ambulatory Visit: Payer: Self-pay | Admitting: Family Medicine

## 2017-02-12 ENCOUNTER — Other Ambulatory Visit: Payer: Self-pay

## 2017-02-12 ENCOUNTER — Ambulatory Visit (INDEPENDENT_AMBULATORY_CARE_PROVIDER_SITE_OTHER): Payer: PPO | Admitting: Family Medicine

## 2017-02-12 ENCOUNTER — Encounter: Payer: Self-pay | Admitting: Family Medicine

## 2017-02-12 VITALS — BP 120/66 | HR 74 | Temp 98.5°F | Wt 249.6 lb

## 2017-02-12 DIAGNOSIS — R0989 Other specified symptoms and signs involving the circulatory and respiratory systems: Secondary | ICD-10-CM | POA: Diagnosis not present

## 2017-02-12 DIAGNOSIS — I10 Essential (primary) hypertension: Secondary | ICD-10-CM | POA: Diagnosis not present

## 2017-02-12 DIAGNOSIS — G629 Polyneuropathy, unspecified: Secondary | ICD-10-CM

## 2017-02-12 DIAGNOSIS — L989 Disorder of the skin and subcutaneous tissue, unspecified: Secondary | ICD-10-CM

## 2017-02-12 DIAGNOSIS — E1142 Type 2 diabetes mellitus with diabetic polyneuropathy: Secondary | ICD-10-CM

## 2017-02-12 DIAGNOSIS — I872 Venous insufficiency (chronic) (peripheral): Secondary | ICD-10-CM | POA: Insufficient documentation

## 2017-02-12 DIAGNOSIS — J069 Acute upper respiratory infection, unspecified: Secondary | ICD-10-CM

## 2017-02-12 LAB — COMPREHENSIVE METABOLIC PANEL
ALT: 30 U/L (ref 0–53)
AST: 31 U/L (ref 0–37)
Albumin: 3.6 g/dL (ref 3.5–5.2)
Alkaline Phosphatase: 100 U/L (ref 39–117)
BILIRUBIN TOTAL: 1.3 mg/dL — AB (ref 0.2–1.2)
BUN: 10 mg/dL (ref 6–23)
CALCIUM: 8.9 mg/dL (ref 8.4–10.5)
CHLORIDE: 99 meq/L (ref 96–112)
CO2: 28 meq/L (ref 19–32)
CREATININE: 0.92 mg/dL (ref 0.40–1.50)
GFR: 86.73 mL/min (ref >60.00)
GLUCOSE: 236 mg/dL — AB (ref 70–99)
Potassium: 3.8 mEq/L (ref 3.5–5.1)
Sodium: 139 mEq/L (ref 135–145)
Total Protein: 7 g/dL (ref 6.0–8.3)

## 2017-02-12 LAB — LIPID PANEL
CHOL/HDL RATIO: 3
Cholesterol: 85 mg/dL (ref 0–200)
HDL: 32.8 mg/dL — AB (ref >39.00)
LDL Cholesterol: 33 mg/dL (ref 0–99)
NONHDL: 52.45
TRIGLYCERIDES: 95 mg/dL (ref 0.0–149.0)
VLDL: 19 mg/dL (ref 0.0–40.0)

## 2017-02-12 LAB — HEMOGLOBIN A1C: Hgb A1c MFr Bld: 8.5 % — ABNORMAL HIGH (ref 4.6–6.5)

## 2017-02-12 MED ORDER — GABAPENTIN 300 MG PO CAPS: 300.0000 mg | ORAL_CAPSULE | Freq: Three times a day (TID) | ORAL | 1 refills | Status: DC

## 2017-02-12 NOTE — Assessment & Plan Note (Signed)
Improving.  Will monitor and if worsens let us know.

## 2017-02-12 NOTE — Assessment & Plan Note (Signed)
Lower extremity edema related to venous insufficiency.  He has hemosiderin deposition changes.  Discussed keeping his legs propped up as much as possible.

## 2017-02-12 NOTE — Assessment & Plan Note (Addendum)
Continue current regimen.  Check A1c. 

## 2017-02-12 NOTE — Patient Instructions (Signed)
Nice to see you. We will check lab work today and contact you with the results. We are going to increase your gabapentin to 300 mg 3 times daily.  You can take three 100 mg pills 3 times a day of your current supply until you run out and then pick up the new prescription. Please monitor your cough and upper respiratory symptoms and if it does not continue to improve please let us know. Please try to prop your legs up as much as possible for your swelling.

## 2017-02-12 NOTE — Assessment & Plan Note (Signed)
Still has symptoms.  Will increase gabapentin.  He can take three 100 mg tablets 3 times a day of his current supply and then there is a new prescription at his pharmacy.  If not improving could consider titrating up on dose versus starting Lyrica.  Counseled that the gabapentin could make him drowsy.

## 2017-02-12 NOTE — Progress Notes (Signed)
Devin Rumps, MD Phone: [phone]  Devin Hanna is a 67 y.o. male who presents today for follow-up.  Diabetes: Typically 140-160.  Taking glipizide, [name], metformin.  No polyuria or polydipsia.  No hyperglycemia.  Due to see ophthalmology.  He is going to make an appointment.  Hypertension: Not checking.  Taking quinapril, spironolactone, Lasix, and carvedilol.  No chest pain or shortness of breath.  Does have chronic intermittent edema in his bilateral lower legs that goes away in the morning.  Has venous stasis hemosiderin deposition changes.  Neuropathy: Notes this continues to bother him.  Notes his feet tingle and feel stiff.  No numbness.  Gabapentin 200 mg 3 times daily has not been beneficial.  Patient notes he developed an upper respiratory infection over new years.  Developed cough and some chest congestion.  Coughing up mucus that is nonbloody.  Noted some chest discomfort only when he coughed.  Has been improving.  He used cold and flu medication.  Social History   Tobacco Use  Smoking Status Former Smoker  . Last attempt to quit: 04/05/1995  . Years since quitting: 21.8  Smokeless Tobacco Former User     ROS see history of present illness  Objective  Physical Exam Vitals:   02/12/17 0816  BP: 120/66  Pulse: 74  Temp: 98.5 F (36.9 C)  SpO2: 95%    BP Readings from Last 3 Encounters:  02/12/17 120/66  10/27/16 134/78  10/22/16 120/70   Wt Readings from Last 3 Encounters:  02/12/17 249 lb 9.6 oz (113.2 kg)  10/27/16 245 lb 12.8 oz (111.5 kg)  10/22/16 248 lb 4 oz (112.6 kg)    Physical Exam  Constitutional: No distress.  Cardiovascular: Normal rate, regular rhythm and normal heart sounds.  Pulmonary/Chest: Effort normal and breath sounds normal.  Musculoskeletal: He exhibits no edema.  Neurological: He is alert. Gait normal.  Skin: Skin is warm and dry. He is not diaphoretic.      Diabetic Foot Exam - Simple   Simple Foot  Form Diabetic Foot exam was performed with the following findings:  Yes 02/12/2017  8:58 AM  Visual Inspection No deformities, no ulcerations, no other skin breakdown bilaterally:  Yes Sensation Testing See comments:  Yes Pulse Check See comments:  Yes Comments Decreased monofilament dorsum of left foot, otherwise intact, 2+ DP pulse, decreased PT pulse bilaterally     Assessment/Plan: Please see individual problem list.  HTN (hypertension) Well-controlled.  Continue current regimen.  Check lab work.  Venous insufficiency Lower extremity edema related to venous insufficiency.  He has hemosiderin deposition changes.  Discussed keeping his legs propped up as much as possible.  DM type 2 (diabetes mellitus, type 2) (HCC) Continue current regimen.  Check A1c.    Neuropathy ([place]) Still has symptoms.  Will increase gabapentin.  He can take three 100 mg tablets 3 times a day of his current supply and then there is a new prescription at his pharmacy.  If not improving could consider titrating up on dose versus starting Lyrica.  Counseled that the gabapentin could make him drowsy.  URI (upper respiratory infection) Improving.  Will monitor and if worsens let us know.  Skin lesion Appears to be some type of vascular lesion.  Will refer to dermatology.  Decreased pedal pulses Discussed the need to obtain ABIs.  These were ordered.   [name] was seen today for follow-up.  Diagnoses and all orders for this visit:  Essential hypertension -  Comp Met (CMET)  Type 2 diabetes mellitus with diabetic polyneuropathy, without long-term current use of insulin (HCC) -     Lipid panel -     HgB A1c  Neuropathy  Decreased pedal pulses -     VAS [place] ABI WITH/WO TBI; Future  Skin lesion -     Ambulatory referral to Dermatology  Venous insufficiency  Viral upper respiratory tract infection  Other orders -     gabapentin (NEURONTIN) 300 MG capsule; Take 1 capsule (300 mg total) by mouth  3 (three) times daily.    Orders Placed This Encounter  Procedures  . Comp Met (CMET)  . Lipid panel  . HgB A1c  . Ambulatory referral to Dermatology    Referral Priority:   Routine    Referral Type:   Consultation    Referral Reason:   Specialty Services Required    Requested Specialty:   Dermatology    Number of Visits Requested:   1    Meds ordered this encounter  Medications  . gabapentin (NEURONTIN) 300 MG capsule    Sig: Take 1 capsule (300 mg total) by mouth 3 (three) times daily.    Dispense:  270 capsule    Refill:  [place], MD Devin Hanna

## 2017-02-12 NOTE — Assessment & Plan Note (Signed)
Well-controlled.  Continue current regimen.  Check lab work.

## 2017-02-12 NOTE — Assessment & Plan Note (Signed)
Discussed the need to obtain ABIs.  These were ordered.

## 2017-02-12 NOTE — Assessment & Plan Note (Signed)
Appears to be some type of vascular lesion.  Will refer to dermatology.

## 2017-02-15 ENCOUNTER — Telehealth: Payer: Self-pay | Admitting: Family Medicine

## 2017-02-15 NOTE — Telephone Encounter (Signed)
LMTCB. Please triage

## 2017-02-15 NOTE — Telephone Encounter (Signed)
Copied from [place] [id number]. Topic: General - Other >> Feb 15, 2017  2:36 PM Lolita Rieger, [place] wrote: Reason for CRM: Pt daughter called and would like a return call at [phone]

## 2017-02-15 NOTE — Telephone Encounter (Signed)
Copied from [place] [id number]. Topic: General - Other >> Feb 15, 2017  4:13 PM Lolita Rieger, RMA wrote: Reason for CRM: Pt daughter called back due to missing a call and I attempted to call the office but no answer please return her call

## 2017-02-15 NOTE — Telephone Encounter (Signed)
Spoke with daughter, left message for patient to call back about cough. Patient told his daughter that you told him at his visit that you would call something in for him if symptoms persist? Please advise

## 2017-02-15 NOTE — Telephone Encounter (Signed)
LMTCB

## 2017-02-15 NOTE — Telephone Encounter (Signed)
Copied from [place] [id number]. Topic: Quick Communication - See Telephone Encounter >> Feb 15, 2017  9:19 AM Bea Graff, NT wrote: CRM for notification. See Telephone encounter for: Pt would like to see if something for his cold can be called into to [facility]. He is having a coughing so hard that its making him sore and trouble sleeping.  02/15/17.

## 2017-02-15 NOTE — Telephone Encounter (Signed)
Please see what other symptoms he has. I did discuss treating him though need to know other symptoms. Does he have chest or sinus congestion? Does he have fever or trouble breathing? Is his cough productive? Any other symptoms? Thanks.

## 2017-02-16 ENCOUNTER — Telehealth: Payer: Self-pay | Admitting: Family Medicine

## 2017-02-16 ENCOUNTER — Ambulatory Visit: Payer: Self-pay | Admitting: *Deleted

## 2017-02-16 MED ORDER — BENZONATATE 200 MG PO CAPS: 200.0000 mg | ORAL_CAPSULE | Freq: Two times a day (BID) | ORAL | 0 refills | Status: DC | PRN

## 2017-02-16 NOTE — Telephone Encounter (Signed)
OK for PEC to triage

## 2017-02-16 NOTE — Telephone Encounter (Signed)
Pt reports dry cough x 1 week. Cough is now productive ,minimal amount "thickish" mucous, onset 2-3 days ago. Reports "moderate" intermittent cough. No fever, also reports no appetite. States drinking fluids well. Has been taking "NyQuil at bedtime and cough syrup pharmacy recommended; pt did not have access to med and could not recall name. Does state "It has helped." Home care advise given. Pt had requested "a stronger"cough medicine be called in by Dr. [name]. See telephone encounter of yesterday.  Note routed to provider.  Reason for Disposition . Cough  Answer Assessment - Initial Assessment Questions 1. ONSET: "When did the cough begin?"     02/10/17 2. SEVERITY: "How bad is the cough today?"      Moderate 3. RESPIRATORY DISTRESS: "Describe your breathing."      WNL 4. FEVER: "Do you have a fever?" If so, ask: "What is your temperature, how was it measured, and when did it start?"     No but didn't check with thermometer 5. SPUTUM: "Describe the color of your sputum" (clear, white, yellow, green)     Brownish, no rust colored tinge, minimal amounts, intermittent, "thickish" 6. HEMOPTYSIS: "Are you coughing up any blood?" If so ask: "How much?" (flecks, streaks, tablespoons, etc.)     No 7. CARDIAC HISTORY: "Do you have any history of heart disease?" (e.g., heart attack, congestive heart failure)      MI  X2; CHF 8. LUNG HISTORY: "Do you have any history of lung disease?"  (e.g., pulmonary embolus, asthma, emphysema)     no 9. PE RISK FACTORS: "Do you have a history of blood clots?" (or: recent major surgery, recent prolonged travel, bedridden )     no 10. OTHER SYMPTOMS: "Do you have any other symptoms?" (e.g., runny nose, wheezing, chest pain)       no  12. TRAVEL: "Have you traveled out of the country in the last month?" (e.g., travel history, exposures)       no  Protocols used: [place]

## 2017-02-16 NOTE — Telephone Encounter (Signed)
Pt triaged.

## 2017-02-16 NOTE — Telephone Encounter (Signed)
Left voicemail for patient to call back. 

## 2017-02-16 NOTE — Telephone Encounter (Signed)
Please advise 

## 2017-02-16 NOTE — Telephone Encounter (Signed)
Copied from [place] [phone]. Topic: Quick Communication - Office Called Patient >> Feb 16, 2017 10:46 AM Cecelia Byars, NT wrote: Reason for IDP:[id number] returning call from office please call back

## 2017-02-16 NOTE — Telephone Encounter (Signed)
We did discuss his cough at his recent visit. I will send tessalon for his cough. If this is not beneficial he should call us. If he develops shortness of breath, fevers, worsening symptoms, or does not improve over the next several days he needs to be evaluated again. Thanks.

## 2017-02-24 ENCOUNTER — Other Ambulatory Visit: Payer: Self-pay | Admitting: Family Medicine

## 2017-02-24 DIAGNOSIS — R17 Unspecified jaundice: Secondary | ICD-10-CM

## 2017-02-24 MED ORDER — SITAGLIPTIN PHOSPHATE 100 MG PO TABS: 100.0000 mg | ORAL_TABLET | Freq: Every day | ORAL | 3 refills | Status: DC

## 2017-03-02 DIAGNOSIS — L570 Actinic keratosis: Secondary | ICD-10-CM | POA: Diagnosis not present

## 2017-03-02 DIAGNOSIS — L988 Other specified disorders of the skin and subcutaneous tissue: Secondary | ICD-10-CM | POA: Diagnosis not present

## 2017-03-02 DIAGNOSIS — C44529 Squamous cell carcinoma of skin of other part of trunk: Secondary | ICD-10-CM | POA: Diagnosis not present

## 2017-03-02 DIAGNOSIS — L820 Inflamed seborrheic keratosis: Secondary | ICD-10-CM | POA: Diagnosis not present

## 2017-03-15 ENCOUNTER — Other Ambulatory Visit (INDEPENDENT_AMBULATORY_CARE_PROVIDER_SITE_OTHER): Payer: PPO

## 2017-03-15 DIAGNOSIS — R17 Unspecified jaundice: Secondary | ICD-10-CM

## 2017-03-15 LAB — BILIRUBIN, FRACTIONATED(TOT/DIR/INDIR): Total Bilirubin: 1 mg/dL (ref 0.2–1.2)

## 2017-03-30 DIAGNOSIS — L570 Actinic keratosis: Secondary | ICD-10-CM | POA: Diagnosis not present

## 2017-03-30 DIAGNOSIS — C44529 Squamous cell carcinoma of skin of other part of trunk: Secondary | ICD-10-CM | POA: Diagnosis not present

## 2017-04-02 ENCOUNTER — Other Ambulatory Visit: Payer: Self-pay | Admitting: Cardiovascular Disease

## 2017-04-02 ENCOUNTER — Telehealth: Payer: Self-pay | Admitting: Cardiovascular Disease

## 2017-04-02 NOTE — Telephone Encounter (Signed)
Pharmacy calling stating they have two prescription for Furosemide  She has there a 20 mg and a 40 mg   Would like to know which one is correct or if he is taking both.  Please call back

## 2017-04-02 NOTE — Telephone Encounter (Signed)
Spoke with christine from [place].  Let her know per Dr. Tyrell Antonio Last note stated take Furosemide 28m  419min the morning and 209mt night.

## 2017-04-07 ENCOUNTER — Telehealth: Payer: Self-pay | Admitting: Cardiovascular Disease

## 2017-04-07 NOTE — Telephone Encounter (Signed)
Pharmacy calling stating patient came in stating to them that there was a dose change in carvedilol  He told them he is now taking the 12.5 mg they have and is cutting that in half and taking is now 2 x a day  They would like to verify if this is correct  Please call back

## 2017-04-08 ENCOUNTER — Other Ambulatory Visit: Payer: Self-pay

## 2017-04-08 MED ORDER — CARVEDILOL 12.5 MG PO TABS: 6.2500 mg | ORAL_TABLET | Freq: Two times a day (BID) | ORAL | 3 refills | Status: DC

## 2017-04-08 NOTE — Telephone Encounter (Signed)
.     carvedilol (COREG) 12.5 MG tablet Take 0.5 tablets (6.25 mg total) by mouth 2 (two) times daily with a meal.    Per 9/13 AVS: "3. Essential hypertension: Blood pressure is now well controlled  on current medications." Coreg refill sent to pharmacy

## 2017-04-08 NOTE — Telephone Encounter (Signed)
Pt taking 12.5 mg tablet and cutting tablet in 1/2 BID. Current med list says Carvedilol 12.5 mg 1 tablet BID but past OV notes indicate pt taking 1/2 tablet BID. Please advise for refill request I don't see where pt was indicated by Provider to cut tablet in half.

## 2017-04-19 DIAGNOSIS — H5203 Hypermetropia, bilateral: Secondary | ICD-10-CM | POA: Diagnosis not present

## 2017-04-19 DIAGNOSIS — H52223 Regular astigmatism, bilateral: Secondary | ICD-10-CM | POA: Diagnosis not present

## 2017-04-19 DIAGNOSIS — E1165 Type 2 diabetes mellitus with hyperglycemia: Secondary | ICD-10-CM | POA: Diagnosis not present

## 2017-04-19 DIAGNOSIS — Z961 Presence of intraocular lens: Secondary | ICD-10-CM | POA: Diagnosis not present

## 2017-05-17 ENCOUNTER — Ambulatory Visit (INDEPENDENT_AMBULATORY_CARE_PROVIDER_SITE_OTHER): Payer: PPO | Admitting: Family Medicine

## 2017-05-17 ENCOUNTER — Encounter: Payer: Self-pay | Admitting: Family Medicine

## 2017-05-17 ENCOUNTER — Other Ambulatory Visit: Payer: Self-pay

## 2017-05-17 VITALS — BP 130/78 | HR 67 | Temp 98.0°F | Ht 67.0 in | Wt 249.6 lb

## 2017-05-17 DIAGNOSIS — C44529 Squamous cell carcinoma of skin of other part of trunk: Secondary | ICD-10-CM | POA: Diagnosis not present

## 2017-05-17 DIAGNOSIS — R0989 Other specified symptoms and signs involving the circulatory and respiratory systems: Secondary | ICD-10-CM | POA: Diagnosis not present

## 2017-05-17 DIAGNOSIS — E1142 Type 2 diabetes mellitus with diabetic polyneuropathy: Secondary | ICD-10-CM

## 2017-05-17 DIAGNOSIS — I10 Essential (primary) hypertension: Secondary | ICD-10-CM | POA: Diagnosis not present

## 2017-05-17 DIAGNOSIS — C609 Malignant neoplasm of penis, unspecified: Secondary | ICD-10-CM

## 2017-05-17 DIAGNOSIS — M7918 Myalgia, other site: Secondary | ICD-10-CM | POA: Diagnosis not present

## 2017-05-17 DIAGNOSIS — I872 Venous insufficiency (chronic) (peripheral): Secondary | ICD-10-CM

## 2017-05-17 LAB — COMPREHENSIVE METABOLIC PANEL
ALT: 31 U/L (ref 0–53)
AST: 33 U/L (ref 0–37)
Albumin: 3.8 g/dL (ref 3.5–5.2)
Alkaline Phosphatase: 86 U/L (ref 39–117)
BILIRUBIN TOTAL: 1 mg/dL (ref 0.2–1.2)
BUN: 11 mg/dL (ref 6–23)
CO2: 28 meq/L (ref 19–32)
CREATININE: 0.92 mg/dL (ref 0.40–1.50)
Calcium: 9.3 mg/dL (ref 8.4–10.5)
Chloride: 100 mEq/L (ref 96–112)
GFR: 86.67 mL/min (ref >60.00)
GLUCOSE: 181 mg/dL — AB (ref 70–99)
Potassium: 3.6 mEq/L (ref 3.5–5.1)
Sodium: 140 mEq/L (ref 135–145)
Total Protein: 7.3 g/dL (ref 6.0–8.3)

## 2017-05-17 LAB — HEMOGLOBIN A1C: Hgb A1c MFr Bld: 7.8 % — ABNORMAL HIGH (ref 4.6–6.5)

## 2017-05-17 LAB — CK: Total CK: 122 U/L (ref 7–232)

## 2017-05-17 NOTE — Assessment & Plan Note (Signed)
Continue current medication.  Check A1c.

## 2017-05-17 NOTE — Assessment & Plan Note (Signed)
No penile lesions noted.  He will monitor for recurrence.

## 2017-05-17 NOTE — Assessment & Plan Note (Signed)
ABIs not previously scheduled though were ordered.  Unable to palpate his PT pulses though I suspect that is related to his swelling.  His feet are warm and appear well-perfused.  His muscle aches are concerning for possible claudication though he could also have a strained muscle.  We will get ABIs to evaluate for PAD.  We will check electrolytes to evaluate for cramping causes as well as a CK given that he is on a cholesterol medication.

## 2017-05-17 NOTE — Patient Instructions (Signed)
Nice to see you. We will check lab work today and contact you with the results. Please continue to follow with dermatology as planned. We will get studies to check the blood flow in your legs.  If you do not hear about this being scheduled sometime in the next week or so please contact us.

## 2017-05-17 NOTE — Assessment & Plan Note (Signed)
Adequately controlled.  Continue current medication.

## 2017-05-17 NOTE — Progress Notes (Signed)
Devin Rumps, MD Phone: [phone]  Devin Hanna is a 67 y.o. male who presents today for f/u.  DIABETES Disease Monitoring: Blood Sugar ranges-150-170 Polyuria/phagia/dipsia- no      Optho- saw 3-4 weeks ago Medications: Compliance- taking januvia, metformin, glipizide Hypoglycemic symptoms- no  HYPERTENSION  Disease Monitoring  Home BP Monitoring not checking Chest pain- no    Dyspnea- no Medications  Compliance-  Taking coreg, lasix, quinapril, spironolactone.   Edema- no  Penile cancer: He notes no new lesions.  He does not want to follow-up with urology.  Leg pain: Bilateral in nature for the last 2 weeks.  Notes his muscles feel sore below his knees.  Will stretch his legs and that does help at night.  He has chronic bilateral swelling related to venous insufficiency that has not changed.  Mostly hurts when he walks and does ease off at night.  He has tried ibuprofen for this.  No injuries.  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  Notes it had been draining a Brissette bit.  Has an apparent keloid at the top.  Done through [facility] dermatology.     Social History   Tobacco Use  Smoking Status Former Smoker  . Last attempt to quit: 04/05/1995  . Years since quitting: 22.1  Smokeless Tobacco Former User     ROS see history of present illness  Objective  Physical Exam Vitals:   05/17/17 0800 05/17/17 1044  BP: 138/70 130/78  Pulse: 67   Temp: 98 F (36.7 C)   SpO2: 99%     BP Readings from Last 3 Encounters:  05/17/17 130/78  02/12/17 120/66  10/27/16 134/78   Wt Readings from Last 3 Encounters:  05/17/17 249 lb 9.6 oz (113.2 kg)  02/12/17 249 lb 9.6 oz (113.2 kg)  10/27/16 245 lb 12.8 oz (111.5 kg)    Physical Exam  Constitutional: No distress.  Cardiovascular: Normal rate, regular rhythm and normal heart sounds.  Pulmonary/Chest: Effort normal and breath sounds normal.  Genitourinary:  Genitourinary Comments: Normal-appearing  uncircumcised penis with no penile lesions  Musculoskeletal:  1+ pitting edema to mid shin, chronic venous stasis hemosiderin deposition changes, no muscular tenderness in either leg  Neurological: He is alert. Gait normal.  Skin: Skin is warm and dry. He is not diaphoretic.      Diabetic Foot Exam - Simple   Simple Foot Form Diabetic Foot exam was performed with the following findings:  Yes 05/17/2017  8:27 AM  Visual Inspection No deformities, no ulcerations, no other skin breakdown bilaterally:  Yes Sensation Testing Intact to touch and monofilament testing bilaterally:  Yes Pulse Check See comments:  Yes Comments DP pulses intact, difficult to palpate PT pulses given swelling       Assessment/Plan: Please see individual problem list.  Venous insufficiency Venous insufficiency changes noted.  He will elevate his legs as much as possible.  HTN (hypertension) Adequately controlled.  Continue current medication.  DM type 2 (diabetes mellitus, type 2) ([place]) Continue current medication.  Check A1c.  Penile cancer ([place]) No penile lesions noted.  He will monitor for recurrence.  Decreased pedal pulses ABIs not previously scheduled though were ordered.  Unable to palpate his PT pulses though I suspect that is related to his swelling.  His feet are warm and appear well-perfused.  His muscle aches are concerning for possible claudication though he could also have a strained muscle.  We will get ABIs to evaluate for PAD.  We will check electrolytes to evaluate for cramping causes as well as a CK given that he is on a cholesterol medication.  SCC (squamous cell carcinoma), trunk Followed by dermatology for this.   Health Maintenance: Request ophthalmology records.  Orders Placed This Encounter  Procedures  . Comp Met (CMET)  . CK (Creatine Kinase)  . HgB A1c    No orders of the defined types were placed in this encounter.    Devin Rumps, MD [place]

## 2017-05-17 NOTE — Assessment & Plan Note (Signed)
Venous insufficiency changes noted.  He will elevate his legs as much as possible.

## 2017-05-17 NOTE — Assessment & Plan Note (Signed)
Followed by dermatology for this.

## 2017-05-27 ENCOUNTER — Other Ambulatory Visit (INDEPENDENT_AMBULATORY_CARE_PROVIDER_SITE_OTHER): Payer: PPO

## 2017-05-27 DIAGNOSIS — R0989 Other specified symptoms and signs involving the circulatory and respiratory systems: Secondary | ICD-10-CM | POA: Diagnosis not present

## 2017-05-31 ENCOUNTER — Other Ambulatory Visit: Payer: Self-pay | Admitting: Family Medicine

## 2017-05-31 ENCOUNTER — Other Ambulatory Visit: Payer: Self-pay | Admitting: Cardiovascular Disease

## 2017-06-02 ENCOUNTER — Encounter (INDEPENDENT_AMBULATORY_CARE_PROVIDER_SITE_OTHER): Payer: Self-pay

## 2017-06-09 ENCOUNTER — Telehealth (INDEPENDENT_AMBULATORY_CARE_PROVIDER_SITE_OTHER): Payer: Self-pay

## 2017-06-09 NOTE — Telephone Encounter (Signed)
Patient called stating that Dr Milbert Coulter has not received her father ultrasound from his lab only.I spoke with my techs and they printed the abi and it was faxed to Dr Milbert Coulter office on today.

## 2017-06-10 ENCOUNTER — Telehealth: Payer: Self-pay | Admitting: Family Medicine

## 2017-06-10 DIAGNOSIS — M79605 Pain in left leg: Principal | ICD-10-CM

## 2017-06-10 DIAGNOSIS — M79604 Pain in right leg: Secondary | ICD-10-CM

## 2017-06-10 NOTE — Telephone Encounter (Signed)
Copied from [place] [id number]. Topic: Quick Communication - See Telephone Encounter >> Jun 10, 2017 12:20 PM [place], Laban Emperor wrote: CRM for notification. See Telephone encounter for: 06/10/17.  Pt daughter [name] calling for results that was fax over to Dr. [name] on 06/09/17.  *Please see phone note from 06/09/17* Also please f/u with [name], she states pt had the test done over 2 weeks ago and still has not recv'd any results.

## 2017-06-10 NOTE — Telephone Encounter (Signed)
Patients daughter notified, she would like to know if there is anything the patient can do for the swelling and the pain behind the knee cap.

## 2017-06-10 NOTE — Telephone Encounter (Signed)
I just received the preliminary results.  The preliminary results appear to show no significant arterial disease in his lower extremities.  No further evaluation would be needed now.

## 2017-06-10 NOTE — Telephone Encounter (Signed)
Please advise 

## 2017-06-10 NOTE — Telephone Encounter (Signed)
We may need to refer to sports medicine or orthopedics for evaluation.

## 2017-06-10 NOTE — Telephone Encounter (Signed)
Left message to return call, ok for pec to speak with patients daughter and ask if they would like referral and which one

## 2017-06-11 NOTE — Telephone Encounter (Signed)
° °  Pt daughter return call and said she will speak with her Dad to see if he would to be referred to sports medicine or orthapedic

## 2017-06-15 NOTE — Telephone Encounter (Signed)
Left message to return call, ok for pec to speak with patients daughter and ask if they would like referral and which one

## 2017-06-16 ENCOUNTER — Telehealth: Payer: Self-pay | Admitting: Family Medicine

## 2017-06-16 NOTE — Telephone Encounter (Signed)
Copied from [place] [id number]. Topic: Quick Communication - See Telephone Encounter >> Jun 16, 2017  3:21 PM Boyd Kerbs wrote: CRM for notification. See Telephone encounter for: 06/16/17.  Ortho surgery referral  with Dr. Hessie Knows at [facility]

## 2017-06-16 NOTE — Telephone Encounter (Signed)
Patients daughter returned call. Please advise

## 2017-06-17 NOTE — Telephone Encounter (Signed)
Referral placed.

## 2017-06-17 NOTE — Telephone Encounter (Signed)
>>   Jun 16, 2017  3:21 PM Boyd Kerbs wrote: CRM for notification. See Telephone encounter for: 06/16/17.  Ortho surgery referral  with Dr. Hessie Knows at [facility]

## 2017-06-24 ENCOUNTER — Telehealth: Payer: Self-pay

## 2017-06-24 NOTE — Telephone Encounter (Signed)
Was this faxed?

## 2017-06-24 NOTE — Telephone Encounter (Signed)
Copied from [place] [phone]. Topic: Quick Communication - See Telephone Encounter >> Jun 16, 2017  3:21 PM Boyd Kerbs wrote: CRM for notification. See Telephone encounter for: 06/16/17.  Ortho surgery referral  with Dr. Hessie Knows at [facility] >> Jun 24, 2017  2:54 PM Yvette Rack wrote: Pt daughter [name] calling stating that Dr Rudene Christians at [place] never received referral his fax number [phone] [facility]

## 2017-06-25 NOTE — Telephone Encounter (Signed)
It never came to me, it was put in a different department. I will send it now to Dr. Theodore Demark office.

## 2017-07-12 ENCOUNTER — Other Ambulatory Visit: Payer: Self-pay | Admitting: Orthopedic Surgery

## 2017-07-12 DIAGNOSIS — M9943 Connective tissue stenosis of neural canal of lumbar region: Secondary | ICD-10-CM

## 2017-07-24 ENCOUNTER — Ambulatory Visit
Admission: RE | Admit: 2017-07-24 | Discharge: 2017-07-24 | Disposition: A | Discharge status: Home / Self Care | Payer: PPO | Source: Ambulatory Visit | Attending: Orthopedic Surgery | Admitting: Orthopedic Surgery

## 2017-07-24 DIAGNOSIS — M9943 Connective tissue stenosis of neural canal of lumbar region: Secondary | ICD-10-CM | POA: Insufficient documentation

## 2017-07-24 DIAGNOSIS — M48061 Spinal stenosis, lumbar region without neurogenic claudication: Secondary | ICD-10-CM | POA: Diagnosis not present

## 2017-07-24 DIAGNOSIS — M2578 Osteophyte, vertebrae: Secondary | ICD-10-CM | POA: Diagnosis not present

## 2017-08-13 ENCOUNTER — Ambulatory Visit: Payer: PPO | Admitting: Family Medicine

## 2017-08-18 ENCOUNTER — Ambulatory Visit (INDEPENDENT_AMBULATORY_CARE_PROVIDER_SITE_OTHER): Payer: PPO | Admitting: Family Medicine

## 2017-08-18 ENCOUNTER — Encounter: Payer: Self-pay | Admitting: Family Medicine

## 2017-08-18 VITALS — BP 108/62 | HR 69 | Temp 98.2°F | Ht 67.0 in | Wt 252.0 lb

## 2017-08-18 DIAGNOSIS — E1142 Type 2 diabetes mellitus with diabetic polyneuropathy: Secondary | ICD-10-CM

## 2017-08-18 DIAGNOSIS — I872 Venous insufficiency (chronic) (peripheral): Secondary | ICD-10-CM

## 2017-08-18 DIAGNOSIS — M5136 Other intervertebral disc degeneration, lumbar region: Secondary | ICD-10-CM

## 2017-08-18 DIAGNOSIS — I10 Essential (primary) hypertension: Secondary | ICD-10-CM | POA: Diagnosis not present

## 2017-08-18 DIAGNOSIS — E7800 Pure hypercholesterolemia, unspecified: Secondary | ICD-10-CM | POA: Diagnosis not present

## 2017-08-18 LAB — POCT GLYCOSYLATED HEMOGLOBIN (HGB A1C): HEMOGLOBIN A1C: 8.7 % — AB (ref 4.0–5.6)

## 2017-08-18 NOTE — Progress Notes (Signed)
  [name], MD Phone: [phone]  Devin Hanna is a 67 y.o. male who presents today for f/u.  CC: dm, htn, hld  HYPERTENSION Disease Monitoring: Blood pressure range-not checking Chest pain- no      Dyspnea- no Medications: Compliance- taking carvedilol, Lasix, quinapril, spironolactone   Edema-chronic and improved from previously  DIABETES Disease Monitoring: Blood Sugar ranges-130-140 occasionally up to the 160s polyuria/phagia/dipsia-no      Optho-up-to-date Medications: Compliance-taking metformin, glipizide, [name] hypoglycemic symptoms-no Patient reports he does not feel right if his blood sugar is less than 130.  He tries to eat healthy.  He does stay active.Patient reports he does not feel right if his CBG is <130  HYPERLIPIDEMIA Disease Monitoring: See symptoms for Hypertension Medications: Compliance- taking lipitor Right upper quadrant pain- no  Muscle aches- no  Patient reports he saw orthopedics for his leg pain. They felt it was coming from his back. He had an MRI that revealed DDD and arthritic changes.   Social History   Tobacco Use  Smoking Status Former Smoker  . Last attempt to quit: 04/05/1995  . Years since quitting: 22.3  Smokeless Tobacco Former User     ROS see history of present illness  Objective  Physical Exam Vitals:   08/18/17 0802  BP: 108/62  Pulse: 69  Temp: 98.2 F (36.8 C)  SpO2: 96%    BP Readings from Last 3 Encounters:  08/18/17 108/62  05/17/17 130/78  02/12/17 120/66   Wt Readings from Last 3 Encounters:  08/18/17 252 lb (114.3 kg)  05/17/17 249 lb 9.6 oz (113.2 kg)  02/12/17 249 lb 9.6 oz (113.2 kg)    Physical Exam  Constitutional: No distress.  Cardiovascular: Normal rate, regular rhythm and normal heart sounds.  Pulmonary/Chest: Effort normal and breath sounds normal.  Musculoskeletal: He exhibits edema (1+ pitting bilateral LE).  Neurological: He is alert.  Skin: Skin is warm and dry. He is  not diaphoretic.     Assessment/Plan: Please see individual problem list.  HTN (hypertension) At goal.  Continue current regimen.  Venous insufficiency Chronic swelling likely related to this.  Has improved.  DM type 2 (diabetes mellitus, type 2) (HCC) A1c worse than prior.  Patient has monetary issues with affording his medications if they would be more expensive than they are currently.  I am going to send a message to our clinical pharmacist to see if she can come up with some other options for him that may be more affordable.  Hyperlipidemia Most recently well-controlled.  Continue Lipitor.  DDD (degenerative disc disease), lumbar Noted on recent MRI.  He will continue to see orthopedics.   Orders Placed This Encounter  Procedures  . POCT HgB A1C    No orders of the defined types were placed in this encounter.    [name], MD [place]

## 2017-08-18 NOTE — Assessment & Plan Note (Signed)
A1c worse than prior.  Patient has monetary issues with affording his medications if they would be more expensive than they are currently.  I am going to send a message to our clinical pharmacist to see if she can come up with some other options for him that may be more affordable.

## 2017-08-18 NOTE — Assessment & Plan Note (Signed)
At goal. Continue current regimen. 

## 2017-08-18 NOTE — Assessment & Plan Note (Signed)
Chronic swelling likely related to this.  Has improved.

## 2017-08-18 NOTE — Assessment & Plan Note (Signed)
Most recently well-controlled.  Continue Lipitor.

## 2017-08-18 NOTE — Patient Instructions (Signed)
Nice to see you. Please continue your current medications. We will check with our clinical pharmacist regarding more affordable medications. Please follow-up with orthopedics regarding your MRI.

## 2017-08-18 NOTE — Assessment & Plan Note (Signed)
Noted on recent MRI.  He will continue to see orthopedics.

## 2017-08-19 ENCOUNTER — Telehealth: Payer: Self-pay | Admitting: Family Medicine

## 2017-08-19 NOTE — Telephone Encounter (Signed)
-----   Message from Carlean Jews, [facility] sent at 08/19/2017 12:57 PM EDT ----- Marykay Lex, He was supposed to get back to me when he found his most up to date financial statements. I'll do the same thing for him and pend an order for [facility] pharmacy services.   Thanks! Chrys Racer ----- Message ----- From: [name], MD Sent: 08/18/2017   8:31 AM To: Carlean Jews, [facility]  Marianne Sofia,  This guy is having trouble affording his [place]. I believe you spoke with him previously and he made too much money for any help on his meds. Could you look at his insurance and let me know what would be the most affordable options for him? I thought about actos, though he has heart failure so that would not be a good option. Let me know what you think. Thanks.   Randall Hiss

## 2017-08-19 NOTE — Telephone Encounter (Signed)
Please let the patient know that a pharmacist will be calling him to try to help with medication management for his diabetes medications. Thanks.

## 2017-08-19 NOTE — Addendum Note (Signed)
Addended by: Deirdre Pippins E on: 08/19/2017 12:59 PM   Modules accepted: Orders

## 2017-08-19 NOTE — Addendum Note (Signed)
Addended by: [name] on: 08/19/2017 02:17 PM   Modules accepted: Orders

## 2017-08-20 ENCOUNTER — Other Ambulatory Visit: Payer: Self-pay

## 2017-08-20 ENCOUNTER — Telehealth: Payer: Self-pay | Admitting: Cardiovascular Disease

## 2017-08-20 NOTE — Patient Outreach (Signed)
[facility]) Care Management  08/20/2017  [place] 1950/04/29 [id number]   Telephone Screen  Referral Date: 08/19/17 Referral Source: MD office Referral Reason: "[name] is too expensive for patient" Insurance: HTA   Voicemail message received from [employer]. RN CM placed return call to daughter. DPR on file to speak with her. Spoke briefly with her as she was currently at work. RN CM discussed referral with caregiver. She voices that she did not go with patient to MD appt on yesterday but patient told her about the appt. She states that MD told patient he needed to get his A1C level down. She voices that patient has tried several meds and nothing has seemed very successful in managing his Diabetes. She reports that patient mentioned to her that MD may possibly consider changing diabetic meds. Daughter did confirm that patient is currently taking [name]. She is unsure how much the med is costing patient. Daughter reports that patient is taking about 8-10 meds and having no other issues with other meds. RN CM offered RN Health Coach services for further diabetes education and support. Patient's daughter declined. She states that patient is very active/busy as he still works full time and that he directs all his medical calls to her. Daughter voices that she would be the one getting the education and does not feel like she needs any further education or support a this time. Patient is independent with all ADLs/IADLs. He drives himself to appts. She voices no other RN CM needs or concerns at this time.    Plan: RN CM will place [facility] pharmacy referral for polypharmacy med review and possible med assistance.    Enzo Montgomery, RN,BSN,CCM [place] [facility] Telephonic Care Management Coordinator Direct Phone: [phone] Toll Free: [phone] Fax: [phone]

## 2017-08-20 NOTE — Telephone Encounter (Signed)
° °  [facility] Pre-operative Risk Assessment    Request for surgical clearance:  1. What type of surgery is being performed? ESI  2. When is this surgery scheduled? Not listed   3. What type of clearance is required (medical clearance vs. Pharmacy clearance to hold med vs. Both)?   4. Are there any medications that need to be held prior to surgery and how long? Asking about Plavix   5. Practice name and name of physician performing surgery? [facility] Dr Rudene Christians  6. What is your office phone number [phone]    7.   What is your office fax number [phone]  8.   Anesthesia type (None, local, MAC, general) ? Not listed

## 2017-08-20 NOTE — Patient Outreach (Signed)
[facility]) Care Management  08/20/2017  [place] Devin Hanna, Devin Hanna [id number]    Telephone Screen  Referral Date: 08/19/17 Referral Source: MD office Referral Reason: "[name] is too expensive for patient" Insurance: HTA   Outreach attempt # 1  to patient. No answer at present. RN CM left HIPAA compliant voicemail message along with contact info.      Plan: RN CM will make outreach attempt to patient within 3-4 business days. RN CM will send unsuccessful outreach letter to patient.    Enzo Montgomery, RN,BSN,CCM [place] [facility] Telephonic Care Management Coordinator Direct Phone: [phone] Toll Free: [phone] Fax: [phone]

## 2017-08-23 NOTE — Telephone Encounter (Signed)
Left message to return call, ok for pec to inform patient he should be receiving a call from the pharmacist regarding his medication

## 2017-08-24 ENCOUNTER — Other Ambulatory Visit: Payer: Self-pay | Admitting: Pharmacist

## 2017-08-24 NOTE — Patient Outreach (Signed)
[facility]) Care Management  08/24/2017  [place] Tomkiewicz Oct 13, 1950 [id number]  67 year old male referred to [facility] by Dr. [name] for medication assistance with [name] and possible recommendations on additional therapy for diabetes management.  PMHx includes, but not limited to, diabetes mellitus type 2, CAD, HTN, HLD, DDD, venous insufficiency, morbid obesity, chronic systolic heart failure, history of penile cancer, SCC, and PUD.    Per review of CHL, patient currently taking metformin, glipizide, and [name] (sitalgiptin - DPP4 inhibitor).   Hemoglobin A1C: 7.8 --> 8.7 (08/18/17).   SCr = 0.92 (05/17/17)  Successful call to Mr. Serfass today. HIPAA identifiers verified. Mr. Solis reports he has trouble affording the [name] but was told he did not qualify for medication assistance in the past.  He reports his income is ~$40-45K / year.  He states he was changed to metformin XR recently which is much more affordable.  He does not have any issues paying for other co-pays at this time.    Per review of CHL, patient spoke with [facility] pharmacist regarding medication assistance in the past but did not follow-up with financial documents to complete medication assistance assessment.    Medication assistance:  Extra Help: Does not qualify based on reported income.  PAP: [name]: [name]: No TROOP.  Does meet income requirement.    I reviewed [name] PAP with patient.  Patient agreeable to apply.    Diabetes management:   May consider adding a SGLT2 inhibitor as patient has documented heart failure diagnosis.  Jardiance PAP may be easier for patient to qualify for as there is no required TROOP.  No CKD noted.   Plan: I will coordinate PAP process with pharmacy technician, Etter Sjogren. She will assist with obtaining all pertinent documents from both patient and Dr. [name] and submit application once completed.  Caryl Pina is out of town this week, therefore I will mail  application to patient tomorrow and follow-up in 5-7 business days to ensure it has been received.    I will route my note to Dr. [name] regarding recommendations for SGLT2i therapy.   [name], [name], [place] [phone]

## 2017-08-25 NOTE — Telephone Encounter (Signed)
Patient is scheduled with pharmacist

## 2017-08-26 NOTE — Telephone Encounter (Signed)
Pt daughter would like to know the status of pt clearance. States pt is awaiting injections in his back. If the call back is after 4:30 pm, please call daughter cell # [phone]

## 2017-08-26 NOTE — Telephone Encounter (Signed)
S/w daughter. Patient denies any new cardiac symptoms. She verbalized understanding to stop Plavix for 5 days after then resume after as advised by surgeon. Patient scheduled to see Dr Fletcher Anon on 09/23/17. Clearance faxed to number provided.

## 2017-08-26 NOTE — Telephone Encounter (Signed)
If he has no new cardiac symptoms, he can proceed at low risk. Plavix can be held 5 days before the procedure and should be resumed after. He is due for a follow-up visit.

## 2017-08-30 ENCOUNTER — Other Ambulatory Visit: Payer: Self-pay | Admitting: Cardiovascular Disease

## 2017-08-30 ENCOUNTER — Other Ambulatory Visit: Payer: Self-pay | Admitting: Family Medicine

## 2017-09-07 ENCOUNTER — Other Ambulatory Visit: Payer: Self-pay | Admitting: Pharmacy Technician

## 2017-09-07 ENCOUNTER — Other Ambulatory Visit: Payer: Self-pay | Admitting: Pharmacist

## 2017-09-07 DIAGNOSIS — M5416 Radiculopathy, lumbar region: Secondary | ICD-10-CM | POA: Diagnosis not present

## 2017-09-07 DIAGNOSIS — M48062 Spinal stenosis, lumbar region with neurogenic claudication: Secondary | ICD-10-CM | POA: Diagnosis not present

## 2017-09-07 DIAGNOSIS — M5136 Other intervertebral disc degeneration, lumbar region: Secondary | ICD-10-CM | POA: Diagnosis not present

## 2017-09-07 NOTE — Patient Outreach (Signed)
[facility]) Care Management  09/07/2017  [place] 23-Sep-1950 [id number]  Successful call to Devin Hanna today regarding medication assistance. Patient reports he has not received application in the mail yet.  He reports he picked up medicine last week for >$100 and would "love to have any extra help."    Plan: I will re-send patient letter and application.  I will route to [facility] pharmacy technician Devin Hanna who will coordinate application process for [name] through [name].  She will assist with obtaining all pertinent documents from both patient and [name] and submit application once completed.    [name], [name], [place] [phone]

## 2017-09-07 NOTE — Patient Outreach (Signed)
[facility]) Care Management  09/07/2017  [place] 1950-12-17 [id number]   Received [name] patient assistance referral from [place] for [name] prepared patient portion and provider portion (Dr. [name]) to be mailed out.  Will follow up with patient in 10-14 business days to confirm application has been received.  Maud Deed [place] [facility], [place] [facility] [phone]

## 2017-09-23 ENCOUNTER — Encounter: Payer: Self-pay | Admitting: Cardiovascular Disease

## 2017-09-23 ENCOUNTER — Ambulatory Visit: Payer: PPO | Admitting: Cardiovascular Disease

## 2017-09-23 VITALS — BP 120/68 | HR 67 | Ht 68.5 in | Wt 246.5 lb

## 2017-09-23 DIAGNOSIS — I5022 Chronic systolic (congestive) heart failure: Secondary | ICD-10-CM | POA: Diagnosis not present

## 2017-09-23 DIAGNOSIS — I10 Essential (primary) hypertension: Secondary | ICD-10-CM | POA: Diagnosis not present

## 2017-09-23 DIAGNOSIS — E7800 Pure hypercholesterolemia, unspecified: Secondary | ICD-10-CM | POA: Diagnosis not present

## 2017-09-23 DIAGNOSIS — I2510 Atherosclerotic heart disease of native coronary artery without angina pectoris: Secondary | ICD-10-CM

## 2017-09-23 NOTE — Patient Instructions (Signed)
Medication Instructions:  Your physician recommends that you continue on your current medications as directed. Please refer to the Current Medication list given to you today.   Labwork: none  Testing/Procedures: none  Follow-Up: Your physician recommends that you schedule a follow-up appointment in: [place].   If you need a refill on your cardiac medications before your next appointment, please call your pharmacy.

## 2017-09-23 NOTE — Progress Notes (Signed)
Cardiology Office Note   Date:  09/23/2017   ID:  Devin Hanna, DOB 20-Mar-1950, MRN [id number]  PCP:  Devin Haven, MD  Cardiologist:   [name], MD   Chief Complaint  Patient presents with  . OTHER    6 month f/u no complaints today. Meds reviewed verbally with pt.      History of Present Illness: Devin Hanna is a 67 y.o. male who presents for a followup visit regarding coronary artery disease and chronic systolic heart failure.  He has known CAD with past RCA stents and prior MI, GERD, DM, HTN and HLD.  Most recent hospitalization was in May of 2018 for acute on chronic systolic heart failure. Echocardiogram showed an EF of 35-40%.   He has been doing well with no recent chest pain or worsening dyspnea.  No significant leg edema.  He has been dealing with lower back pain and had a steroid injection with some improvement.  He also continues to have problems with peripheral neuropathy which limits his activities.  He had an ABI done which was normal.  Past Medical History:  Diagnosis Date  . Acute respiratory failure with hypoxia ([place]) 06/2016  . CAD (coronary artery disease)    a. Remote PCI of the RCA;  b. 03/2013 Inf STEMI/PCI: LAD 60-70d, RCA 99p ISR(rota/CBA/DES);  c. 09/2014 MV: inf and infsept infarct, EF 35-40%;  d. 09/2014 Cath: patent RCA stent, stable LAD dzs, EF 40%-->Med rx.  . Diabetes mellitus    sees Dr. Hardin Negus in [place]  . GERD (gastroesophageal reflux disease)   . HFrEF (heart failure with reduced ejection fraction) ([place])   . Hyperlipidemia   . Ischemic cardiomyopathy    a. 09/2014 EF 40% by V gram;  b. 06/2016 Echo: EF 40, diff HK, Gr1 DD, inf HK, no mural thrombus. Mild MR, mildly dil LA, mildly dil RV.   . LBBB (left bundle branch block)    Old  . Morbid obesity ([place])   . Peripheral neuropathy   . Upper GI bleed   . Vertigo     Past Surgical History:  Procedure Laterality Date  . CARDIAC CATHETERIZATION N/A 10/03/2014   Procedure: Left Heart Cath and Coronary Angiography;  Surgeon: [name], MD;  Location: [facility] CV LAB;  Service: Cardiovascular;  Laterality: N/A;  . CIRCUMCISION N/A 07/22/2015   Procedure: CIRCUMCISION ADULT;  Surgeon: Hollice Espy, MD;  Location: [facility] ORS;  Service: Urology;  Laterality: N/A;  . CORONARY STENT PLACEMENT    . ESOPHAGOGASTRODUODENOSCOPY (EGD) WITH PROPOFOL N/A 09/12/2014   Procedure: ESOPHAGOGASTRODUODENOSCOPY (EGD) WITH PROPOFOL;  Surgeon: Ronald Lobo, MD;  Location: [facility] ENDOSCOPY;  Service: Endoscopy;  Laterality: N/A;  . EXTERNAL FIXATION LEG Left 04/03/2012   Procedure: EXTERNAL FIXATION LEG;  Surgeon: Wylene Simmer, MD;  Location: [place];  Service: Orthopedics;  Laterality: Left;  . EXTERNAL FIXATION REMOVAL Left 04/14/2012   Procedure: REMOVAL EXTERNAL FIXATION LEG;  Surgeon: Wylene Simmer, MD;  Location: [facility] [place];  Service: Orthopedics;  Laterality: Left;  . IRRIGATION AND DEBRIDEMENT KNEE Left 04/03/2012   Procedure: IRRIGATION AND DEBRIDEMENT KNEE;  Surgeon: Wylene Simmer, MD;  Location: [facility];  Service: Orthopedics;  Laterality: Left;  . LEFT HEART CATHETERIZATION WITH CORONARY ANGIOGRAM N/A 04/05/2013   Procedure: LEFT HEART CATHETERIZATION WITH CORONARY ANGIOGRAM;  Surgeon: [name], MD;  Location: [facility] CATH LAB;  Service: Cardiovascular;  Laterality: N/A;  . MASS EXCISION N/A 07/22/2015   Procedure: EXCISION PENILE MASS;  Surgeon: Hollice Espy, MD;  Location: [facility] ORS;  Service: Urology;  Laterality: N/A;  . ORIF TIBIA PLATEAU Left 04/14/2012   Procedure: OPEN REDUCTION INTERNAL FIXATION (ORIF) TIBIAL PLATEAU;  Surgeon: Wylene Simmer, MD;  Location: [facility];  Service: Orthopedics;  Laterality: Left;  . PERCUTANEOUS CORONARY ROTOBLATOR INTERVENTION (PCI-R) N/A 04/06/2013   Procedure: PERCUTANEOUS CORONARY ROTOBLATOR INTERVENTION (PCI-R);  Surgeon: [name], MD;  Location: [facility] CATH LAB;  Service: Cardiovascular;  Laterality: N/A;     Current Outpatient  Medications  Medication Sig Dispense Refill  . atorvastatin (LIPITOR) 40 MG tablet TAKE ONE TABLET EVERY DAY 90 tablet 3  . carvedilol (COREG) 12.5 MG tablet Take 0.5 tablets (6.25 mg total) by mouth 2 (two) times daily with a meal. 90 tablet 3  . Cinnamon 500 MG capsule Take 1,000 mg by mouth 2 (two) times daily with a meal.    . clopidogrel (PLAVIX) 75 MG tablet TAKE ONE TABLET EVERY DAY WITH BREAKFAST 30 tablet 0  . ferrous sulfate (FERROUSUL) 325 (65 FE) MG tablet Take 1 tablet (325 mg total) by mouth 2 (two) times daily with a meal. 60 tablet 5  . furosemide (LASIX) 20 MG tablet TAKE TWO TABLETS EVERY MORNING AND TAKE ONE TABLET BY MOUTH EVERY EVENING 90 tablet 3  . gabapentin (NEURONTIN) 300 MG capsule TAKE 1 CAPSULE BY MOUTH 3 TIMES DAILY 270 capsule 1  . GLIPIZIDE XL 10 MG 24 hr tablet TAKE ONE TABLET EVERY DAY WITH BREAKFAST 90 tablet 1  . metFORMIN (GLUCOPHAGE-XR) 500 MG 24 hr tablet TAKE TWO TABLETS BY MOUTH TWICE DAILY WITH A MEAL 360 tablet 0  . Multiple Vitamin (MULTIVITAMIN WITH MINERALS) TABS Take 1 tablet by mouth daily.    . nitroGLYCERIN (NITROSTAT) 0.4 MG SL tablet Place 1 tablet (0.4 mg total) under the tongue every 5 (five) minutes as needed for chest pain. 25 tablet 3  . pantoprazole (PROTONIX) 40 MG tablet TAKE ONE TABLET TWICE DAILY 60 tablet 0  . quinapril (ACCUPRIL) 20 MG tablet TAKE ONE TABLET EVERY DAY 90 tablet 3  . sitaGLIPtin ([name]) 100 MG tablet Take 1 tablet (100 mg total) by mouth daily. 90 tablet 3  . spironolactone (ALDACTONE) 25 MG tablet TAKE 1/2 TABLET EVERY DAY 30 tablet 3   No current facility-administered medications for this visit.     Allergies:   Patient has no known allergies.    Social History:  The patient  reports that he quit smoking about 22 years ago. He has quit using smokeless tobacco. He reports that he does not drink alcohol or use drugs.   Family History:  The patient's family history includes Breast cancer in his sister; Coronary  artery disease in his brother and father; Lung cancer in his brother; Prostate cancer in his brother; Stroke in his sister.    ROS:  Please see the history of present illness.   Otherwise, review of systems are positive for none.   All other systems are reviewed and negative.    PHYSICAL EXAM: VS:  BP 120/68 (BP Location: Left Arm, Patient Position: Sitting, Cuff Size: Normal)   Pulse 67   Ht 5' 8.5" (1.74 m)   Wt 246 lb 8 oz (111.8 kg)   BMI 36.94 kg/m  , BMI Body mass index is 36.94 kg/m. GEN: Well nourished, well developed, in no acute distress  HEENT: normal  Neck: Mild JVD, carotid bruits, or masses Cardiac: RRR; no  rubs, or gallops. . 2/6 systolic ejection murmur in  the aortic area which is early peaking . Trace leg edema Respiratory:  clear to auscultation bilaterally, normal work of breathing GI: soft, nontender, nondistended, + BS MS: no deformity or atrophy  Skin: warm and dry, no rash Neuro:  Strength and sensation are intact Psych: euthymic mood, full affect   EKG:  EKG is ordered today. The ekg ordered today demonstrates normal sinus rhythm with nonspecific IVCD and possible old inferior infarct.   Recent Labs: 05/17/2017: ALT 31; BUN 11; Creatinine, Ser 0.92; Potassium 3.6; Sodium 140    Lipid Panel    Component Value Date/Time   CHOL 85 02/12/2017 0902   CHOL 103 06/07/2014 0824   TRIG 95.0 02/12/2017 0902   HDL 32.80 (L) 02/12/2017 0902   HDL 38 (L) 06/07/2014 0824   CHOLHDL 3 02/12/2017 0902   VLDL 19.0 02/12/2017 0902   LDLCALC 33 02/12/2017 0902   LDLCALC 41 06/07/2014 0824      Wt Readings from Last 3 Encounters:  09/23/17 246 lb 8 oz (111.8 kg)  08/18/17 252 lb (114.3 kg)  05/17/17 249 lb 9.6 oz (113.2 kg)        ASSESSMENT AND PLAN:  1.  Coronary artery disease involving native coronary arteries without angina:: He is doing reasonably well with no anginal symptoms. Continue medical therapy. He is to stay on Plavix without aspirin due to  prior gastric ulcers.  2. Chronic systolic heart failure:  Appears to be euvolemic on current dose of furosemide.  I reviewed his recent labs which overall were unremarkable.  He is on optimal medical therapy.  I am planning to repeat his echocardiogram next year.  If EF is less than 40%, we can consider switching ACE inhibitor to [facility].  3. Essential hypertension: Blood pressure is well controlled on current medications.  4. Hyperlipidemia: Continue treatment with atorvastatin. LDL has been consistently below 70.  Most recently it was 80.  5.  Type 2 diabetes.  Given the patient's cardiac history, consider treatment with Jardiance.  6.  Obesity: I discussed with him the importance of regular exercise and weight loss.  He reports limitations related to back pain and neuropathy.   Disposition:   FU with me in 6 months  Signed,  [name], MD  09/23/2017 8:31 AM    [place]

## 2017-09-30 ENCOUNTER — Other Ambulatory Visit: Payer: Self-pay | Admitting: Cardiovascular Disease

## 2017-09-30 ENCOUNTER — Other Ambulatory Visit: Payer: Self-pay | Admitting: Pharmacy Technician

## 2017-09-30 ENCOUNTER — Other Ambulatory Visit: Payer: Self-pay | Admitting: Family Medicine

## 2017-09-30 NOTE — Patient Outreach (Signed)
[facility]) Care Management  09/30/2017  [place] 04-25-1950 [id number]   Received patient portion of [name] patient assistance application. Prepared completed application to be mailed to [name].  Will follow up with [name] in 10-14 business days to check status of application.  Maud Deed [place], [place] Management [phone]

## 2017-10-22 ENCOUNTER — Other Ambulatory Visit: Payer: Self-pay | Admitting: Pharmacy Technician

## 2017-10-22 NOTE — Patient Outreach (Signed)
[facility]) Care Management  10/22/2017  [place] 08-17-1950 [id number]   Unsuccessful outreach attempt to patient, HIPAA compliant voicemail left. Calling to verify if patient has received Attestation letter from [name] patient assistance.   Will make 2nd call attempt in 2-3 business days.  Maud Deed [place], [place] Management [phone]

## 2017-10-25 NOTE — Patient Outreach (Signed)
[facility]) Care Management  10/25/2017  [place] 1951/01/22 [id number]   Unsuccessful outreach attempt to patient, HIPAA compliant voicemail left in regards to [employer] out attestation letter.  Will make 2nd call attempt in 2-3 business days.  Devin Hanna [place], [place] [facility] [phone]

## 2017-10-29 ENCOUNTER — Other Ambulatory Visit: Payer: Self-pay | Admitting: Pharmacy Technician

## 2017-10-29 NOTE — Patient Outreach (Signed)
[facility]) Care Management  10/29/2017  Eminence [birth year] [id number]   Return call to patients daughter, Myra Gianotti. [name] stated that patient received letter from [name] patient assistance but there was not attestation form included. She stated there were a couple highlighted places on letter and it stated that patient had been denied. I requested [name] send me a picture of the letter. Upon review of letter, it states "[employer]". Patient appears to be over income for program.  Blair Hailey back and left message for her to return my call so that I can inform her.  Will route note to [place] for case closure.  Maud Deed [place], [place] [facility] [phone]

## 2017-11-03 ENCOUNTER — Other Ambulatory Visit: Payer: Self-pay | Admitting: Pharmacist

## 2017-11-03 NOTE — Patient Outreach (Signed)
[facility]) Care Management  11/03/2017  Devin Hanna [birth year] [id number]  66 y.o. year old male referred to [place] for medication assistance.  Update received from [place] that PAP for [name] denied due to patient's income over requirement.    Call placed to Devin Hanna to provide update on denial.  Patient voiced understanding.  He stated his income was higher than expected this past year.  He expressed appreciation for our efforts.     [facility] pharmacy case is being closed due to the following reasons:  Goals have been met.  Patient has been provided [facility] CM contact information if assistance needed in the future.    Thank you for allowing [facility] pharmacy to be involved in this patient's care.    [name], [name], [place] [phone]

## 2017-11-29 ENCOUNTER — Other Ambulatory Visit: Payer: Self-pay | Admitting: Cardiovascular Disease

## 2017-11-29 ENCOUNTER — Other Ambulatory Visit: Payer: Self-pay | Admitting: Family Medicine

## 2017-12-06 ENCOUNTER — Other Ambulatory Visit: Payer: Self-pay | Admitting: Family Medicine

## 2017-12-07 ENCOUNTER — Telehealth: Payer: Self-pay | Admitting: Cardiovascular Disease

## 2017-12-07 NOTE — Telephone Encounter (Signed)
I left a message for Devin Hanna to call back.

## 2017-12-07 NOTE — Telephone Encounter (Signed)
Pt c/o medication issue:  1. Name of Medication: Spironolactone  2. How are you currently taking this medication (dosage and times per day)? 12.5 mg po q d   3. Are you having a reaction (difficulty breathing--STAT)? No   4. What is your medication issue? Patient daughter wants to be sure he is to still continue taking this with Lasix 40 mg am and 20 mg pm   Please call to discuss

## 2017-12-08 NOTE — Telephone Encounter (Signed)
I left a message for [name] to call.

## 2017-12-09 NOTE — Telephone Encounter (Signed)
Pt daughter is returning your call.

## 2017-12-09 NOTE — Telephone Encounter (Signed)
Reviewed Dr Tyrell Antonio last office visit from 09/23/17 and both medications were on patients med list. No med changes per Dr Fletcher Anon, advised daughter she verbalized understanding.

## 2017-12-20 ENCOUNTER — Ambulatory Visit (INDEPENDENT_AMBULATORY_CARE_PROVIDER_SITE_OTHER): Payer: PPO | Admitting: Family Medicine

## 2017-12-20 ENCOUNTER — Encounter: Payer: Self-pay | Admitting: Family Medicine

## 2017-12-20 VITALS — BP 148/70 | HR 67 | Temp 98.2°F | Ht 68.5 in | Wt 248.0 lb

## 2017-12-20 DIAGNOSIS — Z23 Encounter for immunization: Secondary | ICD-10-CM | POA: Diagnosis not present

## 2017-12-20 DIAGNOSIS — M79671 Pain in right foot: Secondary | ICD-10-CM | POA: Diagnosis not present

## 2017-12-20 DIAGNOSIS — G629 Polyneuropathy, unspecified: Secondary | ICD-10-CM

## 2017-12-20 DIAGNOSIS — E1142 Type 2 diabetes mellitus with diabetic polyneuropathy: Secondary | ICD-10-CM

## 2017-12-20 DIAGNOSIS — L819 Disorder of pigmentation, unspecified: Secondary | ICD-10-CM | POA: Diagnosis not present

## 2017-12-20 DIAGNOSIS — K279 Peptic ulcer, site unspecified, unspecified as acute or chronic, without hemorrhage or perforation: Secondary | ICD-10-CM | POA: Diagnosis not present

## 2017-12-20 DIAGNOSIS — I10 Essential (primary) hypertension: Secondary | ICD-10-CM | POA: Diagnosis not present

## 2017-12-20 DIAGNOSIS — M79672 Pain in left foot: Secondary | ICD-10-CM | POA: Diagnosis not present

## 2017-12-20 LAB — BASIC METABOLIC PANEL
BUN: 17 mg/dL (ref 6–23)
CALCIUM: 9.5 mg/dL (ref 8.4–10.5)
CO2: 30 meq/L (ref 19–32)
Chloride: 96 mEq/L (ref 96–112)
Creatinine, Ser: 1.34 mg/dL (ref 0.40–1.50)
GFR: 56.06 mL/min — AB (ref >60.00)
Glucose, Bld: 162 mg/dL — ABNORMAL HIGH (ref 70–99)
Potassium: 4.1 mEq/L (ref 3.5–5.1)
Sodium: 138 mEq/L (ref 135–145)

## 2017-12-20 LAB — HEMOGLOBIN A1C: Hgb A1c MFr Bld: 7.4 % — ABNORMAL HIGH (ref 4.6–6.5)

## 2017-12-20 MED ORDER — TETANUS-DIPHTH-ACELL PERTUSSIS 5-2.5-18.5 LF-MCG/0.5 IM SUSP: 0.5000 mL | Freq: Once | INTRAMUSCULAR | 0 refills | Status: AC

## 2017-12-20 NOTE — Assessment & Plan Note (Signed)
Check A1c.  Continue current regimen. 

## 2017-12-20 NOTE — Assessment & Plan Note (Signed)
Suspect bruising though we will refer to dermatology to evaluate further.

## 2017-12-20 NOTE — Patient Instructions (Signed)
Nice to see you. We will get lab work today and contact you with the results. We will refer you to dermatology.  If the area on your left big toe resolves you do not need to see them.  If it remains you need to go to the appointment when it is scheduled. We will refer you to podiatry for your feet as well.  Please try a frozen water bottle and roll it on the bottom of your feet to see if that helps.

## 2017-12-20 NOTE — Assessment & Plan Note (Signed)
Continue gabapentin.  Refer to podiatry.

## 2017-12-20 NOTE — Assessment & Plan Note (Signed)
Previously well controlled.  Patient will be advised to start monitoring at home and if not consistently less than 130/80 contact us.

## 2017-12-20 NOTE — Assessment & Plan Note (Signed)
Adequately controlled.  Continue Protonix.

## 2017-12-20 NOTE — Progress Notes (Signed)
[name], MD Phone: [phone]  Devin Hanna is a 67 y.o. male who presents today for f/u.  CC: htn, dm, neuropathy, foot pain, PUD  HYPERTENSION  Disease Monitoring  Home BP Monitoring not checking Chest pain- no    Dyspnea- no Medications  Compliance-  Taking coreg, spironolactone, quinapril.  Edema- chronic, goes down over night, no orthopnea, no PND  DIABETES Disease Monitoring: Blood Sugar ranges-140-180 Polyuria/phagia/dipsia- no      Optho- UTD Medications: Compliance- taking glipizide, metformin, januvia Hypoglycemic symptoms- notes he feels tired when his glucose is in the 120-130 range  Neuropathy: Patient notes issues with tingling in bottom of his feet particularly when he is up on his feet for longer during the day.  He also notes pain in the bottom of his feet that is worse in the morning right when he gets up.  That pain improves the more he uses his feet.  He does note difficulty flexing his toes bilaterally.  He does take gabapentin 3 times daily with some benefit.  Peptic ulcer disease: Taking Protonix.  No blood in stool.  No abdominal pain.  Exam was noted to have a small 2 mm hyperpigmented lesion with regular borders on the tibial aspect of the left great toe.  He is unsure how long its been there though he may have injured himself.    Social History   Tobacco Use  Smoking Status Former Smoker  . Last attempt to quit: 04/05/1995  . Years since quitting: 22.7  Smokeless Tobacco Former User     ROS see history of present illness  Objective  Physical Exam Vitals:   12/20/17 0836 12/20/17 0922  BP: 138/72 (!) 148/70  Pulse: 67   Temp: 98.2 F (36.8 C)   SpO2: 98%     BP Readings from Last 3 Encounters:  12/20/17 (!) 148/70  09/23/17 120/68  08/18/17 108/62   Wt Readings from Last 3 Encounters:  12/20/17 248 lb (112.5 kg)  09/23/17 246 lb 8 oz (111.8 kg)  08/18/17 252 lb (114.3 kg)    Physical Exam  Constitutional: No  distress.  Cardiovascular: Normal rate, regular rhythm and normal heart sounds.  Pulmonary/Chest: Effort normal and breath sounds normal.  Musculoskeletal: He exhibits edema (Trace pedal and ankle edema).       Feet:  Bilateral feet with no tenderness of the plantar surface, sensation light touch intact bilateral feet, capillary refill good bilateral great toes  Neurological: He is alert.  Skin: Skin is warm and dry. He is not diaphoretic.     Assessment/Plan: Please see individual problem list.  Peptic ulcer disease Adequately controlled.  Continue Protonix.  HTN (hypertension) Previously well controlled.  Patient will be advised to start monitoring at home and if not consistently less than 130/80 contact us.  DM type 2 (diabetes mellitus, type 2) (HCC) Check A1c.  Continue current regimen.  Neuropathy (HCC) Continue gabapentin.  Refer to podiatry.  Hyperpigmented skin lesion Suspect bruising though we will refer to dermatology to evaluate further.  Bilateral foot pain Potentially plantar fasciitis given his description.  Discussed icing.  Will refer to podiatry.   Health Maintenance: Prescription for tetanus vaccination given to patient to get at the pharmacy.  Orders Placed This Encounter  Procedures  . Flu vaccine HIGH DOSE PF (Fluzone High dose)  . HgB A1c  . Basic Metabolic Panel (BMET)  . Ambulatory referral to Dermatology    Referral Priority:   Routine    Referral Type:  Consultation    Referral Reason:   Specialty Services Required    Requested Specialty:   Dermatology    Number of Visits Requested:   1  . Ambulatory referral to Podiatry    Referral Priority:   Routine    Referral Type:   Consultation    Referral Reason:   Specialty Services Required    Requested Specialty:   Podiatry    Number of Visits Requested:   1    Meds ordered this encounter  Medications  . Tdap (BOOSTRIX) 5-2.5-18.5 LF-MCG/0.5 injection    Sig: Inject 0.5 mLs into the  muscle once for 1 dose.    Dispense:  0.5 mL    Refill:  0     [name], MD [place]

## 2017-12-20 NOTE — Assessment & Plan Note (Signed)
Potentially plantar fasciitis given his description.  Discussed icing.  Will refer to podiatry.

## 2017-12-23 ENCOUNTER — Other Ambulatory Visit: Payer: Self-pay | Admitting: Family Medicine

## 2017-12-23 DIAGNOSIS — N179 Acute kidney failure, unspecified: Secondary | ICD-10-CM

## 2017-12-28 ENCOUNTER — Other Ambulatory Visit: Payer: Self-pay | Admitting: Cardiovascular Disease

## 2017-12-29 ENCOUNTER — Other Ambulatory Visit (INDEPENDENT_AMBULATORY_CARE_PROVIDER_SITE_OTHER): Payer: PPO

## 2017-12-29 DIAGNOSIS — N179 Acute kidney failure, unspecified: Secondary | ICD-10-CM

## 2017-12-29 LAB — BASIC METABOLIC PANEL
BUN: 13 mg/dL (ref 6–23)
CHLORIDE: 97 meq/L (ref 96–112)
CO2: 29 meq/L (ref 19–32)
CREATININE: 1.22 mg/dL (ref 0.40–1.50)
Calcium: 9.2 mg/dL (ref 8.4–10.5)
GFR: 62.46 mL/min (ref >60.00)
Glucose, Bld: 192 mg/dL — ABNORMAL HIGH (ref 70–99)
POTASSIUM: 4 meq/L (ref 3.5–5.1)
Sodium: 136 mEq/L (ref 135–145)

## 2017-12-31 ENCOUNTER — Telehealth: Payer: Self-pay | Admitting: Family Medicine

## 2017-12-31 NOTE — Telephone Encounter (Signed)
Left message on voicemail for pt's daughter, [name] to return call to office for results.

## 2017-12-31 NOTE — Telephone Encounter (Signed)
Pt's daughter, [name], called to get results.  Copied from [place] [phone]. Topic: Quick Communication - Lab Results (Clinic Use ONLY) >> Dec 31, 2017 11:50 AM Myriam Forehand, [place] wrote: Called patient to inform them of  lab results. When patient returns call, triage nurse may disclose results.

## 2018-01-05 NOTE — Progress Notes (Signed)
Called pt and left a VM to call back.

## 2018-01-05 NOTE — Progress Notes (Signed)
CRM created and sent to [facility] pool.

## 2018-01-29 ENCOUNTER — Other Ambulatory Visit: Payer: Self-pay | Admitting: Family Medicine

## 2018-02-01 ENCOUNTER — Other Ambulatory Visit: Payer: Self-pay

## 2018-02-01 MED ORDER — SITAGLIPTIN PHOSPHATE 100 MG PO TABS: 100.0000 mg | ORAL_TABLET | Freq: Every day | ORAL | 3 refills | Status: DC

## 2018-02-01 MED ORDER — METFORMIN HCL ER 500 MG PO TB24: ORAL_TABLET | ORAL | 0 refills | Status: DC

## 2018-03-30 ENCOUNTER — Encounter: Payer: Self-pay | Admitting: Family Medicine

## 2018-03-30 ENCOUNTER — Ambulatory Visit (INDEPENDENT_AMBULATORY_CARE_PROVIDER_SITE_OTHER): Payer: PPO | Admitting: Family Medicine

## 2018-03-30 VITALS — BP 130/80 | HR 74 | Temp 98.3°F | Ht 68.5 in | Wt 254.0 lb

## 2018-03-30 DIAGNOSIS — E7800 Pure hypercholesterolemia, unspecified: Secondary | ICD-10-CM | POA: Diagnosis not present

## 2018-03-30 DIAGNOSIS — I10 Essential (primary) hypertension: Secondary | ICD-10-CM

## 2018-03-30 DIAGNOSIS — Z862 Personal history of diseases of the blood and blood-forming organs and certain disorders involving the immune mechanism: Secondary | ICD-10-CM | POA: Diagnosis not present

## 2018-03-30 DIAGNOSIS — C609 Malignant neoplasm of penis, unspecified: Secondary | ICD-10-CM

## 2018-03-30 DIAGNOSIS — E1142 Type 2 diabetes mellitus with diabetic polyneuropathy: Secondary | ICD-10-CM

## 2018-03-30 LAB — CBC
HEMATOCRIT: 40.2 % (ref 39.0–52.0)
Hemoglobin: 13.8 g/dL (ref 13.0–17.0)
MCHC: 34.3 g/dL (ref 30.0–36.0)
MCV: 93.9 fl (ref 78.0–100.0)
Platelets: 197 10*3/uL (ref 150.0–400.0)
RBC: 4.28 Mil/uL (ref 4.22–5.81)
RDW: 14.3 % (ref 11.5–15.5)
WBC: 7 10*3/uL (ref 4.0–10.5)

## 2018-03-30 LAB — HEMOGLOBIN A1C: HEMOGLOBIN A1C: 7.8 % — AB (ref 4.6–6.5)

## 2018-03-30 NOTE — Assessment & Plan Note (Signed)
Check lipid panel  

## 2018-03-30 NOTE — Assessment & Plan Note (Signed)
Check A1c.  Continue current regimen. 

## 2018-03-30 NOTE — Assessment & Plan Note (Signed)
No evidence of recurrence.  He will monitor.  He has previously deferred follow-up with urology.

## 2018-03-30 NOTE — Addendum Note (Signed)
Addended by: Leeanne Rio on: 03/30/2018 09:30 AM   Modules accepted: Orders

## 2018-03-30 NOTE — Assessment & Plan Note (Signed)
Adequately controlled.  Continue current regimen.  Check CMP.

## 2018-03-30 NOTE — Assessment & Plan Note (Signed)
This was in the setting of an upper GI bleed.  He has continued on iron.  We will recheck CBC and iron levels.  I did discuss that he is due for a colonoscopy though he declines wanting to proceed with any colon cancer screening.  I discussed the risk of missing a polyp or a colon cancer and he verbalized understanding.

## 2018-03-30 NOTE — Progress Notes (Signed)
[name], MD Phone: [phone]  Devin Hanna is a 68 y.o. male who presents today for f/u.  CC: htn, aki, dm, history of GI bleed  Hypertension: Not checking blood pressures.  He is taking carvedilol, Lasix, quinapril, and spironolactone.  No chest pain or shortness of breath.  He has chronic bilateral leg edema that goes down overnight.  No orthopnea or PND.  AKI: Noted to have a decrease in renal function that rebounded slightly previously.  He is staying adequately hydrated.  He is avoiding NSAIDs.  Diabetes: Typically 130-160.  Notes he feels better around 140 or above.  Taking [name] and glipizide and metformin.  He does note some occasional polyuria though frequently for 1 day and then be fine.  No polydipsia.  No hypoglycemia.  History of GI bleed: Patient had an upper GI bleed previously.  He has not had any blood in his stool or changes to his stool.  No abdominal pain.  He is taking Protonix.  He is also on iron supplementation.  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  He keeps a check on this.  Social History   Tobacco Use  Smoking Status Former Smoker  . Last attempt to quit: 04/05/1995  . Years since quitting: 23.0  Smokeless Tobacco Former User     ROS see history of present illness  Objective  Physical Exam Vitals:   03/30/18 0815  BP: 130/80  Pulse: 74  Temp: 98.3 F (36.8 C)  SpO2: 96%    BP Readings from Last 3 Encounters:  03/30/18 130/80  12/20/17 (!) 148/70  09/23/17 120/68   Wt Readings from Last 3 Encounters:  03/30/18 254 lb (115.2 kg)  12/20/17 248 lb (112.5 kg)  09/23/17 246 lb 8 oz (111.8 kg)    Physical Exam Constitutional:      General: He is not in acute distress.    Appearance: He is not diaphoretic.  Cardiovascular:     Rate and Rhythm: Normal rate and regular rhythm.     Heart sounds: Normal heart sounds.  Pulmonary:     Effort: Pulmonary effort is normal.     Breath sounds: Normal breath sounds.    Abdominal:     General: Bowel sounds are normal. There is no distension.     Palpations: Abdomen is soft.     Tenderness: There is no abdominal tenderness. There is no guarding or rebound.  Genitourinary:    Penis: Normal.      Scrotum/Testes: Normal.     Comments: No inguinal lymphadenopathy Skin:    General: Skin is warm and dry.  Neurological:     Mental Status: He is alert.      Assessment/Plan: Please see individual problem list.  HTN (hypertension) Adequately controlled.  Continue current regimen.  Check CMP.  DM type 2 (diabetes mellitus, type 2) (HCC) Check A1c.  Continue current regimen.  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  He will monitor.  He has previously deferred follow-up with urology.  Hyperlipidemia Check lipid panel.  History of anemia This was in the setting of an upper GI bleed.  He has continued on iron.  We will recheck CBC and iron levels.  I did discuss that he is due for a colonoscopy though he declines wanting to proceed with any colon cancer screening.  I discussed the risk of missing a polyp or a colon cancer and he verbalized understanding.   Orders Placed This Encounter  Procedures  . CBC  .  Iron, TIBC and Ferritin Panel  . HgB A1c  . Comp Met (CMET)  . Lipid panel    No orders of the defined types were placed in this encounter.    [name], MD [place]

## 2018-03-30 NOTE — Patient Instructions (Signed)
Nice to see you. We will check lab work today and contact you with the results. 

## 2018-03-31 ENCOUNTER — Other Ambulatory Visit: Payer: Self-pay | Admitting: Family Medicine

## 2018-03-31 ENCOUNTER — Other Ambulatory Visit: Payer: PPO

## 2018-03-31 ENCOUNTER — Other Ambulatory Visit: Payer: Self-pay | Admitting: Cardiovascular Disease

## 2018-03-31 DIAGNOSIS — Z862 Personal history of diseases of the blood and blood-forming organs and certain disorders involving the immune mechanism: Secondary | ICD-10-CM

## 2018-03-31 LAB — COMPREHENSIVE METABOLIC PANEL
AG Ratio: 1.2 (calc) (ref 1.0–2.5)
ALT: 37 U/L (ref 9–46)
AST: 57 U/L — ABNORMAL HIGH (ref 10–35)
Albumin: 3.7 g/dL (ref 3.6–5.1)
Alkaline phosphatase (APISO): 99 U/L (ref 35–144)
BUN/Creatinine Ratio: 11 (calc) (ref 6–22)
BUN: 17 mg/dL (ref 7–25)
CO2: 22 mmol/L (ref 20–32)
CREATININE: 1.52 mg/dL — AB (ref 0.70–1.18)
Calcium: 9.5 mg/dL (ref 8.6–10.3)
Chloride: 99 mmol/L (ref 98–110)
Globulin: 3.1 g/dL (calc) (ref 1.9–3.7)
Glucose, Bld: 210 mg/dL — ABNORMAL HIGH (ref 65–99)
Potassium: 4.3 mmol/L (ref 3.5–5.3)
Sodium: 139 mmol/L (ref 135–146)
Total Bilirubin: 0.6 mg/dL (ref 0.2–1.2)
Total Protein: 6.8 g/dL (ref 6.1–8.1)

## 2018-03-31 LAB — LIPID PANEL
Cholesterol: 121 mg/dL (ref <200)
HDL: 36 mg/dL — ABNORMAL LOW (ref >=40)
LDL Cholesterol (Calc): 59 mg/dL (calc)
Non-HDL Cholesterol (Calc): 85 mg/dL (calc) (ref <130)
TRIGLYCERIDES: 185 mg/dL — AB (ref <150)
Total CHOL/HDL Ratio: 3.4 (calc) (ref <5.0)

## 2018-03-31 LAB — IRON,TIBC AND FERRITIN PANEL
%SAT: 31 % (calc) (ref 20–48)
Ferritin: 85 ng/mL (ref 24–380)
Iron: 102 ug/dL (ref 50–180)
TIBC: 333 ug/dL (ref 250–425)

## 2018-03-31 NOTE — Telephone Encounter (Signed)
LMOV for patient to call back to verify how patient is currently taking his carvedilol

## 2018-04-01 NOTE — Telephone Encounter (Signed)
It should be 6.25 mg twice daily

## 2018-04-01 NOTE — Telephone Encounter (Signed)
Call placed to the daughter and the patient to inform them that the patient should be on Carvedilol 6.25 mg bid. He has verbalized his understanding.   If he needs anything before his follow up, he has been advised to call.

## 2018-04-01 NOTE — Telephone Encounter (Signed)
Spoke with patients daughter.  Okay per DPR.  Tried to confirm how patient was currently taking Carvedilol  Patients daughter was unsure how patient is currently suppose to take this medication and asked me to confirm with Dr.Arida what the correct instructions should be.   Pharmacy is requesting  carvedilol (COREG) 12.5 MG tablet        Sig: TAKE ONE TABLET BY MOUTH TWICE DAILY WITH MEALS   Patients medications list in chart states:  . carvedilol (COREG) 12.5 MG tablet Take 0.5 tablets (6.25 mg total) by mouth 2 (two) times daily with a meal.

## 2018-04-01 NOTE — Telephone Encounter (Signed)
Call placed to the patient. He stated that the current bottle of Carvedilol states 12.5 mg twice daily. The dosage prior to that was Carvedilol half a tablet (6.25 mg) twice daily.  There is no documentation of when the dosage was increased. The pharmacy stated that the last request they received was on 11/2017 for 12.5 mg bid.   The patient stated that he felt okay, just tired. He does not have any way to check his blood pressure or heart rate at home.  Follow up appointment is on 04/14/2018 with Dr. Fletcher Anon.

## 2018-04-14 ENCOUNTER — Ambulatory Visit: Payer: PPO | Admitting: Cardiovascular Disease

## 2018-04-15 NOTE — Progress Notes (Signed)
Office Visit    Patient Name: Devin Hanna Date of Encounter: 04/19/2018  Primary Care Provider:  [name], MD Primary Cardiologist:  [name], MD  Chief Complaint    68 y/o ? with a history of CAD status post prior inferior MI, hypertension, hyperlipidemia, HFrEF, ischemic cardiomyopathy, diabetes, peripheral neuropathy, obesity, and GERD, who presents for follow-up related to CAD and heart failure.  Past Medical History    Past Medical History:  Diagnosis Date  . Acute respiratory failure with hypoxia ([place]) 06/2016  . CAD (coronary artery disease)    a. Remote PCI of the RCA;  b. 03/2013 Inf STEMI/PCI: LAD 60-70d, RCA 99p ISR(rota/CBA/DES);  c. 09/2014 MV: inf and infsept infarct, EF 35-40%;  d. 09/2014 Cath: patent RCA stent, stable LAD dzs, EF 40%-->Med rx.  . Diabetes mellitus    sees Dr. Hardin Negus in [place]  . GERD (gastroesophageal reflux disease)   . HFrEF (heart failure with reduced ejection fraction) ([place])   . Hyperlipidemia   . Ischemic cardiomyopathy    a. 09/2014 EF 40% by V gram;  b. 06/2016 Echo: EF 40, diff HK, Gr1 DD, inf HK, no mural thrombus. Mild MR, mildly dil LA, mildly dil RV.   . LBBB (left bundle branch block)    Old  . Morbid obesity ([place])   . Peripheral neuropathy   . Upper GI bleed   . Vertigo    Past Surgical History:  Procedure Laterality Date  . CARDIAC CATHETERIZATION N/A 10/03/2014   Procedure: Left Heart Cath and Coronary Angiography;  Surgeon: [name], MD;  Location: [place] [facility];  Service: Cardiovascular;  Laterality: N/A;  . CIRCUMCISION N/A 07/22/2015   Procedure: CIRCUMCISION ADULT;  Surgeon: Hollice Espy, MD;  Location: [facility];  Service: Urology;  Laterality: N/A;  . CORONARY STENT PLACEMENT    . ESOPHAGOGASTRODUODENOSCOPY (EGD) WITH PROPOFOL N/A 09/12/2014   Procedure: ESOPHAGOGASTRODUODENOSCOPY (EGD) WITH PROPOFOL;  Surgeon: Ronald Lobo, MD;  Location: [facility];  Service: Endoscopy;   Laterality: N/A;  . EXTERNAL FIXATION LEG Left 04/03/2012   Procedure: EXTERNAL FIXATION LEG;  Surgeon: Wylene Simmer, MD;  Location: [place];  Service: Orthopedics;  Laterality: Left;  . EXTERNAL FIXATION REMOVAL Left 04/14/2012   Procedure: REMOVAL EXTERNAL FIXATION LEG;  Surgeon: Wylene Simmer, MD;  Location: [place];  Service: Orthopedics;  Laterality: Left;  . IRRIGATION AND DEBRIDEMENT KNEE Left 04/03/2012   Procedure: IRRIGATION AND DEBRIDEMENT KNEE;  Surgeon: Wylene Simmer, MD;  Location: [place];  Service: Orthopedics;  Laterality: Left;  . LEFT HEART CATHETERIZATION WITH CORONARY ANGIOGRAM N/A 04/05/2013   Procedure: LEFT HEART CATHETERIZATION WITH CORONARY ANGIOGRAM;  Surgeon: [name], MD;  Location: [place] C[facility] LAB;  Service: Cardiovascular;  Laterality: N/A;  . MASS EXCISION N/A 07/22/2015   Procedure: EXCISION PENILE MASS;  Surgeon: Hollice Espy, MD;  Location: [facility];  Service: Urology;  Laterality: N/A;  . ORIF TIBIA PLATEAU Left 04/14/2012   Procedure: OPEN REDUCTION INTERNAL FIXATION (ORIF) TIBIAL PLATEAU;  Surgeon: Wylene Simmer, MD;  Location: [place];  Service: Orthopedics;  Laterality: Left;  . PERCUTANEOUS CORONARY ROTOBLATOR INTERVENTION (PCI-R) N/A 04/06/2013   Procedure: PERCUTANEOUS CORONARY ROTOBLATOR INTERVENTION (PCI-R);  Surgeon: [name], MD;  Location: [facility] C[facility] LAB;  Service: Cardiovascular;  Laterality: N/A;    Allergies  No Known Allergies  History of Present Illness    68 year old male with the above complex past medical history including coronary artery disease status post remote PCI of  the RCA followed by inferior STEMI in February 2015 requiring drug-eluting stent placement to the RCA secondary to in-stent restenosis.  Last catheterization in 2016 showed patent RCA stent.  Other history includes HFrEF and ischemic cardiomyopathy with an EF of 35 to 40% by echo in 2018, hypertension, hyperlipidemia, diabetes, left bundle branch block, peripheral neuropathy, GERD,  and prior history of GI bleed.  He was last seen in clinic in August 2019, at which time he was doing well.  Since his last visit, he reports that he is continued to do well.  Activity is very limited secondary to chronic left knee pain and peripheral neuropathy.  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  He denies experiencing chest pain or dyspnea though he may become dyspneic with higher levels of activity/exertion.  His blood pressure is elevated today and he notes that he does not routinely check this at home.  He denies palpitations, PND, orthopnea, dizziness, syncope, or early satiety.  He does note mild lower extremity swelling at the end of each day.  Home Medications    Prior to Admission medications   Medication Sig Start Date End Date Taking? Authorizing Provider  atorvastatin (LIPITOR) 40 MG tablet TAKE ONE TABLET BY MOUTH EVERY DAY 09/30/17   [name], MD  carvedilol (COREG) 6.25 MG tablet Take 1 tablet (6.25 mg total) by mouth 2 (two) times daily with a meal. 04/01/18   [name], MD  Cinnamon 500 MG capsule Take 1,000 mg by mouth 2 (two) times daily with a meal.    [provider]  clopidogrel (PLAVIX) 75 MG tablet TAKE 1 TABLET BY MOUTH EVERY MORNING WITH BREAKFAST 11/29/17   [name], MD  ferrous sulfate (FERROUSUL) 325 (65 FE) MG tablet Take 1 tablet (325 mg total) by mouth 2 (two) times daily with a meal. 11/01/14   [name], MD  furosemide (LASIX) 20 MG tablet TAKE TWO TABLETS BY MOUTH EVERY MORNING AND TAKE ONE TABLET BY MOUTH EVERY EVENING 12/28/17   Minna Merritts, MD  gabapentin (NEURONTIN) 300 MG capsule TAKE 1 CAPSULE 3 TIMES DAILY 12/02/17   [name], MD  glipiZIDE (GLUCOTROL XL) 10 MG 24 hr tablet TAKE ONE TABLET EVERY DAY WITH BREAKFAST 12/02/17   [name], MD  metFORMIN (GLUCOPHAGE-XR) 500 MG 24 hr tablet TAKE 2 TABLETS BY MOUTH TWICE DAILY WITHA MEAL 02/01/18   [name], MD  Multiple Vitamin (MULTIVITAMIN WITH MINERALS) TABS Take 1 tablet by mouth daily.    [provider]  nitroGLYCERIN (NITROSTAT) 0.4 MG SL tablet Place 1 tablet (0.4 mg total) under the tongue every 5 (five) minutes as needed for chest pain. 10/27/16   [name], MD  pantoprazole (PROTONIX) 40 MG tablet TAKE 1 TABLET BY MOUTH TWICE DAILY 11/29/17   [name], MD  quinapril (ACCUPRIL) 20 MG tablet TAKE ONE TABLET EVERY DAY 08/30/17   Minna Merritts, MD  sitaGLIPtin ([name]) 100 MG tablet Take 1 tablet (100 mg total) by mouth daily. 02/01/18   [name], MD  spironolactone (ALDACTONE) 25 MG tablet TAKE 1/2 TABLET EVERY DAY 12/07/17   [name], MD    Review of Systems    He is accustomed to experiencing mild lower extremity swelling after sitting all day.  This typically is not present first thing in the morning.  He has peripheral neuropathy which causes pain in his feet and  limits walking. He denies chest pain, palpitations, dyspnea, pnd, orthopnea, n, v, dizziness, syncope, weight gain, or early satiety. All other systems reviewed and are otherwise negative except as noted above.  Physical Exam    VS:  BP (!) 150/72 (BP Location: Left Arm, Patient Position: Sitting, Cuff Size: Normal)   Pulse 71   Ht 5' 8"  (1.727 m)   Wt 251 lb 8 oz (114.1 kg)   BMI 38.24 kg/m  , BMI Body mass index is 38.24 kg/m. GEN: Obese, in no acute distress. HEENT: normal. Neck: Supple, no JVD, carotid bruits, or masses. Cardiac: RRR, no murmurs, rubs, or gallops. No clubbing, cyanosis, trace bilateral ankle edema.  Radials/PT 2+ and equal bilaterally.  Respiratory:  Respirations regular and unlabored, clear to auscultation bilaterally. GI: Soft, nontender, nondistended, BS + x 4. MS: no deformity or atrophy. Skin: warm and dry, no rash. Neuro:  Strength and sensation are intact. Psych: Normal affect.  Accessory Clinical Findings    ECG personally  reviewed by me today -regular sinus rhythm, 71, first-degree AV block, left bundle branch block- no acute changes.  Lab Results  Component Value Date   CHOL 121 03/30/2018   HDL 36 (L) 03/30/2018   LDLCALC 59 03/30/2018   TRIG 185 (H) 03/30/2018   CHOLHDL 3.4 03/30/2018   Lab Results  Component Value Date   ALT 37 03/30/2018   AST 57 (H) 03/30/2018   ALKPHOS 86 05/17/2017   BILITOT 0.6 03/30/2018   Lab Results  Component Value Date   CREATININE 1.52 (H) 03/30/2018   BUN 17 03/30/2018   NA 139 03/30/2018   K 4.3 03/30/2018   CL 99 03/30/2018   CO2 22 03/30/2018   Lab Results  Component Value Date   WBC 7.0 03/30/2018   HGB 13.8 03/30/2018   HCT 40.2 03/30/2018   MCV 93.9 03/30/2018   PLT 197.0 03/30/2018    Lab Results  Component Value Date   HGBA1C 7.8 (H) 03/30/2018     Assessment & Plan    1.  Coronary artery disease: Status post prior RCA stenting with catheterization 2016 revealing patent RCA stent.  He has not been experiencing any chest pain or dyspnea on exertion though notes activity is fairly limited.  He remains on statin, beta-blocker, Plavix, and ACE inhibitor therapy.  He is to remain on Plavix without aspirin due to prior history of gastric ulcers and GI bleed.  2.  HFrEF/ischemic cardiomyopathy: Trace bilateral lower extremity edema, which he notes is dependent in nature not generally present first thing in the morning.  Weight is up since his last visit and he admits to dietary indiscretions.  He remains on beta-blocker, ACE inhibitor, and spironolactone therapy.  I will arrange for a follow-up echocardiogram to reevaluate LV function, at which time we can consider transitioning ACE inhibitor to [facility] if EF remains less than 40%.  3.  Essential hypertension: Blood pressure elevated today at 150/72.  I reviewed heart rate and blood pressure trends over the past year and see that he has been running in the 106Y to 694 systolic.  Heart rates are typically  in the 70s.  I am going to increase carvedilol up to 12.5 twice daily.  He notes that he had been taking 12.5 twice daily before even though he was prescribed 6.25 twice daily, and that his blood pressure was better controlled on the higher dose.  I will arrange for a follow-up blood pressure check with 1 of our nurses in  about 2 weeks and have encouraged him to purchase a blood pressure cuff so that he may evaluate his blood pressure at home.  4.  Hyperlipidemia: Recent LDL of 59.  ALT was within normal limits, AST mildly elevated.  Continue statin therapy.  5.  Type 2 diabetes mellitus: A1c 7.8 in February.  He remains on glipizide, Glucophage, and [name].  He is also on ACE inhibitor and statin.  This is followed closely by primary care.  6.  Morbid obesity: Activity very limited in the setting of left knee arthritis and pain as well as peripheral neuropathy resulting in bilateral foot pain.  Encouraged him to look more closely at his caloric intake.  7.  CKD II-III: creat recently stable.  Remains on acei.  8.  Disposition: Follow-up blood pressure check in 2 weeks.  I will arrange for follow-up echo.  Follow-up with Dr. Fletcher Anon in 6 months or sooner if necessary.   Yetta Glassman am acting as a Education administrator for Murray Hodgkins, NP.  I have reviewed the above documentation for accuracy and completeness, and I agree with the above.   Murray Hodgkins, NP 04/19/2018, 8:58 AM

## 2018-04-19 ENCOUNTER — Encounter: Payer: Self-pay | Admitting: Nurse Practitioner

## 2018-04-19 ENCOUNTER — Ambulatory Visit (INDEPENDENT_AMBULATORY_CARE_PROVIDER_SITE_OTHER): Payer: PPO | Admitting: Nurse Practitioner

## 2018-04-19 VITALS — BP 150/72 | HR 71 | Ht 68.0 in | Wt 251.5 lb

## 2018-04-19 DIAGNOSIS — I10 Essential (primary) hypertension: Secondary | ICD-10-CM | POA: Diagnosis not present

## 2018-04-19 DIAGNOSIS — I2510 Atherosclerotic heart disease of native coronary artery without angina pectoris: Secondary | ICD-10-CM

## 2018-04-19 DIAGNOSIS — I255 Ischemic cardiomyopathy: Secondary | ICD-10-CM | POA: Diagnosis not present

## 2018-04-19 DIAGNOSIS — I5022 Chronic systolic (congestive) heart failure: Secondary | ICD-10-CM

## 2018-04-19 DIAGNOSIS — N182 Chronic kidney disease, stage 2 (mild): Secondary | ICD-10-CM

## 2018-04-19 DIAGNOSIS — E785 Hyperlipidemia, unspecified: Secondary | ICD-10-CM

## 2018-04-19 DIAGNOSIS — E1122 Type 2 diabetes mellitus with diabetic chronic kidney disease: Secondary | ICD-10-CM

## 2018-04-19 MED ORDER — CARVEDILOL 12.5 MG PO TABS: 12.5000 mg | ORAL_TABLET | Freq: Two times a day (BID) | ORAL | 3 refills | Status: DC

## 2018-04-19 NOTE — Patient Instructions (Signed)
Medication Instructions:  Your physician has recommended you make the following change in your medication:  1- INCREASE Coreg Take 1 tablet (12.5 mg total) by mouth 2 (two) times daily with a meal.   If you need a refill on your cardiac medications before your next appointment, please call your pharmacy.   Lab work: None ordered  If you have labs (blood work) drawn today and your tests are completely normal, you will receive your results only by: Marland Kitchen MyChart Message (if you have MyChart) OR . A paper copy in the mail If you have any lab test that is abnormal or we need to change your treatment, we will call you to review the results.  Testing/Procedures: None ordered   Follow-Up: At [facility], you and your health needs are our priority.  As part of our continuing mission to provide you with exceptional heart care, we have created designated Provider Care Teams.  These Care Teams include your primary Cardiologist (physician) and Advanced Practice Providers (APPs -  Physician Assistants and Nurse Practitioners) who all work together to provide you with the care you need, when you need it. You will need a follow up appointment in 6 months.  Please call our office 2 months in advance to schedule this appointment.  You may see [name], MD or Murray Hodgkins, NP.     Any Other Special Instructions Will Be Listed Below (If Applicable). Please schedule a Nurse Blood Pressure check in 2 weeks on ________________@________________ .

## 2018-04-20 ENCOUNTER — Telehealth: Payer: Self-pay | Admitting: Nurse Practitioner

## 2018-04-20 NOTE — Telephone Encounter (Signed)
Pt daughter would like a call to discuss why pt needs an echocardiogram

## 2018-04-20 NOTE — Telephone Encounter (Signed)
Attempted to reach the daughter at the number provided which was a work number. There was no answer.  Left a message on the home number to call back.

## 2018-04-20 NOTE — Telephone Encounter (Signed)
Spoke with the patient's daughter per the dpr. She was unable to attend the visit yesterday and wanted to know why the ECHO was ordered.  Per the office note: I will arrange for a follow-up echocardiogram to reevaluate LV function, at which time we can consider transitioning ACE inhibitor to [facility] if EF remains less than 40%.  She has been made aware and verbalized her understanding.

## 2018-04-28 ENCOUNTER — Other Ambulatory Visit: Payer: Self-pay | Admitting: Cardiovascular Disease

## 2018-04-28 ENCOUNTER — Other Ambulatory Visit: Payer: Self-pay | Admitting: Family Medicine

## 2018-04-28 ENCOUNTER — Telehealth: Payer: Self-pay | Admitting: Cardiovascular Disease

## 2018-04-28 NOTE — Telephone Encounter (Signed)
Last OV 03/31/2018  Gabapentin 12/02/2017 disp 270 with 1 RF   Spironolactone 12/07/2017 disp 30 with 3 refills   Sent to PCP for approval

## 2018-04-28 NOTE — Telephone Encounter (Signed)
Pt c/o medication issue:  1. Name of Medication: Carvedilol   2. How are you currently taking this medication (dosage and times per day)?  12.5 mg po BID  3. Are you having a reaction (difficulty breathing--STAT)?  No   4. What is your medication issue? Pharmacy calling to confirm correct dose was sent today for refill.  Please review and advise

## 2018-04-28 NOTE — Telephone Encounter (Signed)
Please see result not from 03/31/18 and confirm what he is taking. Please relay the result note and then I can consider refilling. He will need follow-up lab work for his kidney function.

## 2018-04-28 NOTE — Telephone Encounter (Signed)
Call to pharmacy, confirmed that provider requested he take carvedilol BID at the last OV.  Tech agreed to fill this dose.   No further orders at this time.

## 2018-05-03 NOTE — Telephone Encounter (Signed)
Called pt and left a VM to call back.

## 2018-05-03 NOTE — Telephone Encounter (Signed)
Pt's daughter, [name], called and left message on [facility] mailbox VM.

## 2018-05-05 NOTE — Telephone Encounter (Signed)
Please try to call the patients daughter and confirm if he is taking these and relay the result note as outline below.

## 2018-05-06 ENCOUNTER — Telehealth: Payer: Self-pay

## 2018-05-06 NOTE — Telephone Encounter (Signed)
Copied from [place] [id number]. Topic: General - Call Back - No Documentation >> May 06, 2018  2:21 PM Sheran Luz wrote: Reason for CRM: Patient's daughter  returning call to [facility] regarding 3/10 refill request

## 2018-05-06 NOTE — Telephone Encounter (Signed)
Called and spoke with pt's daughter. Advised daughter that we received refill request for gabapentin and spirolactone. Daughter stated that he father is still take the spirolactone the 25 MG 1/2 tablet once daily still. Asked her if he needed refills on either Rx's at this time and she stated " NO" that he had just picked up refills yesterday.

## 2018-05-18 ENCOUNTER — Other Ambulatory Visit: Payer: PPO

## 2018-06-28 ENCOUNTER — Other Ambulatory Visit: Payer: Self-pay | Admitting: Family Medicine

## 2018-07-19 ENCOUNTER — Ambulatory Visit (INDEPENDENT_AMBULATORY_CARE_PROVIDER_SITE_OTHER): Payer: PPO

## 2018-07-19 ENCOUNTER — Other Ambulatory Visit: Payer: Self-pay

## 2018-07-19 DIAGNOSIS — I255 Ischemic cardiomyopathy: Secondary | ICD-10-CM | POA: Diagnosis not present

## 2018-07-19 DIAGNOSIS — I5022 Chronic systolic (congestive) heart failure: Secondary | ICD-10-CM

## 2018-07-19 MED ORDER — PERFLUTREN LIPID MICROSPHERE
1.0000 mL | INTRAVENOUS | Status: AC | PRN
  Administered 2018-07-19: 2 mL via INTRAVENOUS

## 2018-07-20 ENCOUNTER — Telehealth: Payer: Self-pay

## 2018-07-20 ENCOUNTER — Telehealth: Payer: Self-pay | Admitting: Nurse Practitioner

## 2018-07-20 DIAGNOSIS — Z79899 Other long term (current) drug therapy: Secondary | ICD-10-CM

## 2018-07-20 DIAGNOSIS — I5022 Chronic systolic (congestive) heart failure: Secondary | ICD-10-CM

## 2018-07-20 MED ORDER — SACUBITRIL-VALSARTAN 49-51 MG PO TABS: ORAL_TABLET | ORAL | 11 refills | Status: DC

## 2018-07-20 NOTE — Telephone Encounter (Signed)
Notes recorded by Theora Gianotti, NP on 07/20/2018 at 8:32 AM EDT No change in heart squeezing function since prior echo in 2018 - still 35-40%. We ordered this echo in March in order to determine if it was appropriate to transition him from accupril to entresto. If financially feasible for pt, I would recommend he hold accupril for 2 days and then start entresto 49/77m BID. He will require a bmet 1 week after starting entresto. It's likely that we can reduce his lasix dose after starting entresto, but would make that decision after lab work

## 2018-07-20 NOTE — Telephone Encounter (Signed)
PA started through [place] ([facility]: [facility])  Rx #: [id number]  Entresto 49-51MG tablets   [facility] has received your information, and the request will be reviewed. You may close this dialog, return to your dashboard, and perform other tasks. You will receive an electronic determination in CoverMyMeds. You can see the latest determination by locating this request on your dashboard or by reopening this request. You will also receive a faxed copy of the determination. If you have any questions please contact [facility] at [phone]. If you need assistance, please chat with CoverMyMeds or call us at [phone]

## 2018-07-20 NOTE — Telephone Encounter (Signed)
I spoke with the patient's daughter, [name].  I have advised her of the patient's echo results and recommendations from Ignacia Bayley, NP as listed below.   She voices understanding of these recommendations and is agreeable.   She is aware this will most likely require a prior auth and could be costly, but to please notify us if this is the case.   She will make sure he is off accupril x 2 days, then start entresto 49/51 mg BID, then repeat a BMP 1 week after starting at the [facility].   RX sent to the pharmacy & BMP ordered.

## 2018-07-20 NOTE — Telephone Encounter (Signed)
Message from Plan PA Case: [id number] Status: Approved Coverage Starts on: 07/20/2018 12:00:00 AM Coverage Ends on: 07/20/2019 12:00:00 AM.

## 2018-07-20 NOTE — Telephone Encounter (Signed)
I spoke with the patient regarding his echo results and Ignacia Bayley, NP's recommendations to:  1) stop accupirl, then 2) 2 days later- start entresto 49/51 mg BID 3) BMP 1 week after starting   The patient voices understanding of the above and is agreeable. I have advised that entresto is not generic and will most likely require a prior authorization to be done.  He is aware we can send in his RX to the pharmacy with a coupon for a free- 30 days free.  He asked that I call his daughter, [name], and discuss this with her.  I advised I will speak with [name] prior US sending in his RX for entresto.  The patient again voices understanding and is agreeable.  Attempted to call the patient's daughter, [name]. I left a message to call back at [phone].    Contacted the in office nurse, Pam. She gave ID's numbers for entresto 30 day card to be applied to RX when sent to the pharmacy.    RxBin: [id number] RxPCN: [id number] KXFGH: [id number] Issuer: [id number] ID #: [id number]

## 2018-07-25 ENCOUNTER — Telehealth: Payer: Self-pay | Admitting: Cardiovascular Disease

## 2018-07-25 MED ORDER — ENTRESTO 24-26 MG PO TABS: 1.0000 | ORAL_TABLET | Freq: Two times a day (BID) | ORAL | 11 refills | Status: DC

## 2018-07-25 NOTE — Telephone Encounter (Signed)
Spoke to daughter, [name], okay per DPR.  Reviewed medication change by Dr. Fletcher Anon. She verbalized understanding.   I sent in new script to [facility] per [facility] request.   Advised pt to call for any further questions or concerns.

## 2018-07-25 NOTE — Telephone Encounter (Signed)
Left message to call back  

## 2018-07-25 NOTE — Telephone Encounter (Signed)
Patient daughter calling with BP readings Patient took medication Devin Hanna) Sat morning, night and then Sun morning, did not take Sun night  Sat 6/13 afternoon 133/68 Sun 6/14 morning 115/66 Sun 6/14 noon 90/51 Sun 6/14 night 104/58

## 2018-07-25 NOTE — Telephone Encounter (Signed)
Hold p.m. dose of Entresto and resume tomorrow at a lower dose of 24/26 mg twice daily

## 2018-07-25 NOTE — Telephone Encounter (Signed)
  Daughter is returning call

## 2018-07-25 NOTE — Telephone Encounter (Signed)
Spoke with daughter, DPR on file.  She states she spoke with pt at [facility] yesterday and he said he just generally didn't feel good.  He did not report lightheadedness or dizziness, just general fatigue and "just not feeling good".  HRs were 67-77.  Daughter told pt not to take PM dose of Entresto until she could speak with [place] today.  At 9pm last night pt told daughter he felt much better.  Advised I will send message to Dr. Fletcher Anon and Ignacia Bayley, NP for review.

## 2018-07-29 ENCOUNTER — Ambulatory Visit: Payer: PPO | Admitting: Family Medicine

## 2018-07-29 ENCOUNTER — Other Ambulatory Visit: Payer: Self-pay | Admitting: Family Medicine

## 2018-07-29 ENCOUNTER — Other Ambulatory Visit: Payer: Self-pay | Admitting: Cardiovascular Disease

## 2018-08-02 NOTE — Telephone Encounter (Signed)
Please contact the pharmacy to see if this is a request for change in metformin XR related to the recent recall.  If it is please contact the patient and see if he would be willing to try immediate release metformin.  If he is not willing to try that we can switch him to something different though it may be more expensive.

## 2018-08-05 ENCOUNTER — Other Ambulatory Visit: Payer: Self-pay

## 2018-08-05 ENCOUNTER — Other Ambulatory Visit
Admission: RE | Admit: 2018-08-05 | Discharge: 2018-08-05 | Disposition: A | Discharge status: Home / Self Care | Payer: PPO | Source: Ambulatory Visit | Attending: Nurse Practitioner | Admitting: Nurse Practitioner

## 2018-08-05 DIAGNOSIS — Z862 Personal history of diseases of the blood and blood-forming organs and certain disorders involving the immune mechanism: Secondary | ICD-10-CM | POA: Diagnosis not present

## 2018-08-05 DIAGNOSIS — Z79899 Other long term (current) drug therapy: Secondary | ICD-10-CM | POA: Diagnosis not present

## 2018-08-05 DIAGNOSIS — I5022 Chronic systolic (congestive) heart failure: Secondary | ICD-10-CM | POA: Diagnosis not present

## 2018-08-05 LAB — CBC
HCT: 39.5 % (ref 39.0–52.0)
Hemoglobin: 13.1 g/dL (ref 13.0–17.0)
MCH: 32.1 pg (ref 26.0–34.0)
MCHC: 33.2 g/dL (ref 30.0–36.0)
MCV: 96.8 fL (ref 80.0–100.0)
Platelets: 166 10*3/uL (ref 150–400)
RBC: 4.08 MIL/uL — ABNORMAL LOW (ref 4.22–5.81)
RDW: 13.3 % (ref 11.5–15.5)
WBC: 7 10*3/uL (ref 4.0–10.5)
nRBC: 0 % (ref 0.0–0.2)

## 2018-08-05 LAB — BASIC METABOLIC PANEL
Anion gap: 11 (ref 5–15)
BUN: 19 mg/dL (ref 8–23)
CO2: 24 mmol/L (ref 22–32)
Calcium: 9 mg/dL (ref 8.9–10.3)
Chloride: 104 mmol/L (ref 98–111)
Creatinine, Ser: 1.42 mg/dL — ABNORMAL HIGH (ref 0.61–1.24)
GFR calc Af Amer: 58 mL/min — ABNORMAL LOW (ref >60)
GFR calc non Af Amer: 50 mL/min — ABNORMAL LOW (ref >60)
Glucose, Bld: 192 mg/dL — ABNORMAL HIGH (ref 70–99)
Potassium: 4.4 mmol/L (ref 3.5–5.1)
Sodium: 139 mmol/L (ref 135–145)

## 2018-08-26 ENCOUNTER — Other Ambulatory Visit: Payer: Self-pay | Admitting: Family Medicine

## 2018-08-26 ENCOUNTER — Other Ambulatory Visit: Payer: Self-pay | Admitting: Cardiovascular Disease

## 2018-09-22 ENCOUNTER — Telehealth: Payer: Self-pay | Admitting: Cardiovascular Disease

## 2018-09-22 NOTE — Telephone Encounter (Signed)
DPR on file Returned the call to the patient's daughter [name]. lmctb.

## 2018-09-22 NOTE — Telephone Encounter (Signed)
Patient daughter was told patient may need repeat echo after medications .  Please advise.

## 2018-09-23 NOTE — Telephone Encounter (Signed)
Attempted to call Devin Hanna back. No answer- I left a message to please call back.

## 2018-09-23 NOTE — Telephone Encounter (Signed)
I spoke with [name] and advised her that with the patient starting Entresto we typically do not repeat an echo for 3 months. The patient has an appointment with Dr Fletcher Anon on 9/18.  [name] is aware that Dr. Fletcher Anon can review with them at that time when the echo should be repeated.  She voices understanding and is agreeable.

## 2018-09-28 ENCOUNTER — Other Ambulatory Visit: Payer: Self-pay | Admitting: Family Medicine

## 2018-10-24 ENCOUNTER — Other Ambulatory Visit: Payer: Self-pay

## 2018-10-24 ENCOUNTER — Other Ambulatory Visit: Payer: Self-pay | Admitting: Cardiovascular Disease

## 2018-10-24 ENCOUNTER — Other Ambulatory Visit: Payer: Self-pay | Admitting: Family Medicine

## 2018-10-24 MED ORDER — ATORVASTATIN CALCIUM 40 MG PO TABS: 40.0000 mg | ORAL_TABLET | Freq: Every day | ORAL | 0 refills | Status: DC

## 2018-10-24 NOTE — Telephone Encounter (Signed)
Please call the patient and see if he has enough of these medications to last until his visit in 2 days.  Thanks.

## 2018-10-24 NOTE — Telephone Encounter (Signed)
This patient has not been seen in a while and he is requesting several medications, Gabapentin, spironolactone, Junuvia and metformin.  He has an upcoming appt in 2 days, I was not sure to fill it or not so I left it. It looks like he needs labs as. Please advise.  Reyanna Baley,cma

## 2018-10-26 ENCOUNTER — Encounter: Payer: Self-pay | Admitting: Family Medicine

## 2018-10-26 ENCOUNTER — Other Ambulatory Visit: Payer: Self-pay

## 2018-10-26 ENCOUNTER — Ambulatory Visit (INDEPENDENT_AMBULATORY_CARE_PROVIDER_SITE_OTHER): Payer: PPO | Admitting: Family Medicine

## 2018-10-26 VITALS — BP 160/80 | HR 68 | Temp 97.2°F | Ht 67.0 in | Wt 258.0 lb

## 2018-10-26 DIAGNOSIS — M79671 Pain in right foot: Secondary | ICD-10-CM | POA: Diagnosis not present

## 2018-10-26 DIAGNOSIS — E1142 Type 2 diabetes mellitus with diabetic polyneuropathy: Secondary | ICD-10-CM | POA: Diagnosis not present

## 2018-10-26 DIAGNOSIS — G629 Polyneuropathy, unspecified: Secondary | ICD-10-CM

## 2018-10-26 DIAGNOSIS — Z23 Encounter for immunization: Secondary | ICD-10-CM

## 2018-10-26 DIAGNOSIS — M79672 Pain in left foot: Secondary | ICD-10-CM

## 2018-10-26 DIAGNOSIS — Z862 Personal history of diseases of the blood and blood-forming organs and certain disorders involving the immune mechanism: Secondary | ICD-10-CM | POA: Diagnosis not present

## 2018-10-26 DIAGNOSIS — I10 Essential (primary) hypertension: Secondary | ICD-10-CM

## 2018-10-26 DIAGNOSIS — I5022 Chronic systolic (congestive) heart failure: Secondary | ICD-10-CM | POA: Diagnosis not present

## 2018-10-26 LAB — BASIC METABOLIC PANEL
BUN: 18 mg/dL (ref 6–23)
CO2: 26 mEq/L (ref 19–32)
Calcium: 9.6 mg/dL (ref 8.4–10.5)
Chloride: 99 mEq/L (ref 96–112)
Creatinine, Ser: 1.53 mg/dL — ABNORMAL HIGH (ref 0.40–1.50)
GFR: 45.15 mL/min — ABNORMAL LOW (ref >60.00)
Glucose, Bld: 149 mg/dL — ABNORMAL HIGH (ref 70–99)
Potassium: 4 mEq/L (ref 3.5–5.1)
Sodium: 138 mEq/L (ref 135–145)

## 2018-10-26 LAB — IBC + FERRITIN
Ferritin: 55.5 ng/mL (ref 22.0–322.0)
Iron: 116 ug/dL (ref 42–165)
Saturation Ratios: 30.2 % (ref 20.0–50.0)
Transferrin: 274 mg/dL (ref 212.0–360.0)

## 2018-10-26 LAB — MICROALBUMIN / CREATININE URINE RATIO
Creatinine,U: 21.7 mg/dL
Microalb Creat Ratio: 3.2 mg/g (ref 0.0–30.0)
Microalb, Ur: <0.7 mg/dL (ref 0.0–1.9)

## 2018-10-26 LAB — HEMOGLOBIN A1C: Hgb A1c MFr Bld: 6.7 % — ABNORMAL HIGH (ref 4.6–6.5)

## 2018-10-26 NOTE — Assessment & Plan Note (Signed)
Overall doing well.  He will continue to see cardiology.  Continue current regimen per cardiology.

## 2018-10-26 NOTE — Assessment & Plan Note (Signed)
Above goal today though he reports adequately controlled at home.  He does not check frequently.  I have asked him to check his blood pressure daily for the next week and then call us with his readings.  We will have him schedule for a repeat office visit in 1 month.  If his blood pressure readings at home are adequately controlled we can push this out further.  He will continue his current regimen.

## 2018-10-26 NOTE — Assessment & Plan Note (Signed)
Continues to be an issue.  We will have him see podiatry given his foot pain.  We will continue gabapentin.  We could increase the dose if podiatry evaluation is negative for an alternative cause of his foot pain.

## 2018-10-26 NOTE — Assessment & Plan Note (Signed)
Check A1c.  Continue current regimen.  Consider alternative [name] that may be cheaper.

## 2018-10-26 NOTE — Patient Instructions (Signed)
Nice to see you. We will have you see podiatry. Please try freezing a water bottle and icing the bottom of your feet. We will check lab work today and contact you with the results. Please check your blood pressure daily for the next week and let us know which your readings are.

## 2018-10-26 NOTE — Assessment & Plan Note (Signed)
He continues on iron supplementation.  We will recheck his levels.  Discussed the potential for discontinuing iron supplementation.

## 2018-10-26 NOTE — Progress Notes (Signed)
[name], MD Phone: [phone]  Devin Hanna is a 68 y.o. male who presents today for follow-up.  Diabetes: Typically CBGs are 130-140.  Taking [name], glipizide, and metformin.  No polyuria or polydipsia.  No hypoglycemia.  He thinks he has seen ophthalmology in the last year though he is unsure.  He does report having trouble affording the [name] though he notes he met with our pharmacist previously and he made too much money to qualify for assistance.  Bilateral foot pain: Patient notes the bottom of his feet hurt the more he walks and stands.  They hurt the most right after he gets up in the morning.  This has been going on for a number of months.  He does have neuropathy symptoms with tingling and numbness on the bottom of his feet.  Gabapentin does help some with that.  It does not make him drowsy.  He did not see podiatry.  Hypertension/CHF: Taking carvedilol, Lasix, Entresto, and spironolactone.  He reports his blood pressure is typically in the 160V systolically though he does not check it very frequently.  No chest pain, shortness of breath, orthopnea, or PND.  He does have minimal ankle edema if he is on his legs for too long.  Social History   Tobacco Use  Smoking Status Former Smoker  . Quit date: 04/05/1995  . Years since quitting: 23.5  Smokeless Tobacco Former User     ROS see history of present illness  Objective  Physical Exam Vitals:   10/26/18 0857 10/26/18 0929  BP: (!) 160/80 (!) 160/80  Pulse: 68   Temp: (!) 97.2 F (36.2 C)   SpO2: 99%     BP Readings from Last 3 Encounters:  10/26/18 (!) 160/80  04/19/18 (!) 150/72  03/30/18 130/80   Wt Readings from Last 3 Encounters:  10/26/18 258 lb (117 kg)  04/19/18 251 lb 8 oz (114.1 kg)  03/30/18 254 lb (115.2 kg)    Physical Exam Constitutional:      General: He is not in acute distress.    Appearance: He is not diaphoretic.  Cardiovascular:     Rate and Rhythm: Normal rate and regular  rhythm.     Heart sounds: Normal heart sounds.  Pulmonary:     Effort: Pulmonary effort is normal.     Breath sounds: Normal breath sounds.  Musculoskeletal:     Right lower leg: No edema.     Left lower leg: No edema.     Comments: No tenderness on palpation of the bottom of his feet  Skin:    General: Skin is warm and dry.  Neurological:     Mental Status: He is alert.    Diabetic Foot Exam - Simple   Simple Foot Form Diabetic Foot exam was performed with the following findings: Yes 10/26/2018  9:29 AM  Visual Inspection No deformities, no ulcerations, no other skin breakdown bilaterally: Yes Sensation Testing See comments: Yes Pulse Check Posterior Tibialis and Dorsalis pulse intact bilaterally: Yes Comments Intact light touch sensation though decreased monofilament testing bilaterally throughout      Assessment/Plan: Please see individual problem list.  Chronic systolic heart failure (HCC) Overall doing well.  He will continue to see cardiology.  Continue current regimen per cardiology.  HTN (hypertension) Above goal today though he reports adequately controlled at home.  He does not check frequently.  I have asked him to check his blood pressure daily for the next week and then call us with his  readings.  We will have him schedule for a repeat office visit in 1 month.  If his blood pressure readings at home are adequately controlled we can push this out further.  He will continue his current regimen.  DM type 2 (diabetes mellitus, type 2) (HCC) Check A1c.  Continue current regimen.  Consider alternative [name] that may be cheaper.  Neuropathy ([place]) Continues to be an issue.  We will have him see podiatry given his foot pain.  We will continue gabapentin.  We could increase the dose if podiatry evaluation is negative for an alternative cause of his foot pain.  Bilateral foot pain Seems to be plantar fasciitis related.  Discussed icing.  Discussed stretching.  Will  refer to podiatry.  History of anemia He continues on iron supplementation.  We will recheck his levels.  Discussed the potential for discontinuing iron supplementation.   Health Maintenance: Discussed the importance of him seeing an ophthalmologist once yearly.  Orders Placed This Encounter  Procedures  . Flu Vaccine QUAD High Dose(Fluad)  . HgB A1c  . IBC + Ferritin  . Urine Microalbumin w/creat. ratio  . Basic Metabolic Panel (BMET)  . Ambulatory referral to Podiatry    Referral Priority:   Routine    Referral Type:   Consultation    Referral Reason:   Specialty Services Required    Requested Specialty:   Podiatry    Number of Visits Requested:   1    No orders of the defined types were placed in this encounter.    [name], MD [place]

## 2018-10-26 NOTE — Assessment & Plan Note (Signed)
Seems to be plantar fasciitis related.  Discussed icing.  Discussed stretching.  Will refer to podiatry.

## 2018-10-27 ENCOUNTER — Telehealth: Payer: Self-pay | Admitting: Family Medicine

## 2018-10-27 NOTE — Telephone Encounter (Signed)
I called pt and left message for pt to call ofc to schedule Return in about 1 month (around 11/25/2018) for Hypertension.

## 2018-10-28 ENCOUNTER — Encounter: Payer: Self-pay | Admitting: Cardiovascular Disease

## 2018-10-28 ENCOUNTER — Other Ambulatory Visit: Payer: Self-pay

## 2018-10-28 ENCOUNTER — Ambulatory Visit (INDEPENDENT_AMBULATORY_CARE_PROVIDER_SITE_OTHER): Payer: PPO | Admitting: Cardiovascular Disease

## 2018-10-28 VITALS — BP 128/78 | HR 72 | Ht 67.0 in | Wt 260.5 lb

## 2018-10-28 DIAGNOSIS — I5022 Chronic systolic (congestive) heart failure: Secondary | ICD-10-CM | POA: Diagnosis not present

## 2018-10-28 DIAGNOSIS — I2510 Atherosclerotic heart disease of native coronary artery without angina pectoris: Secondary | ICD-10-CM

## 2018-10-28 DIAGNOSIS — E782 Mixed hyperlipidemia: Secondary | ICD-10-CM | POA: Diagnosis not present

## 2018-10-28 DIAGNOSIS — I10 Essential (primary) hypertension: Secondary | ICD-10-CM | POA: Diagnosis not present

## 2018-10-28 NOTE — Patient Instructions (Signed)
Medication Instructions:  Your physician recommends that you continue on your current medications as directed. Please refer to the Current Medication list given to you today.  If you need a refill on your cardiac medications before your next appointment, please call your pharmacy.   Lab work: None ordered If you have labs (blood work) drawn today and your tests are completely normal, you will receive your results only by: Marland Kitchen MyChart Message (if you have MyChart) OR . A paper copy in the mail If you have any lab test that is abnormal or we need to change your treatment, we will call you to review the results.  Testing/Procedures: None ordered  Follow-Up: At [facility], you and your health needs are our priority.  As part of our continuing mission to provide you with exceptional heart care, we have created designated Provider Care Teams.  These Care Teams include your primary Cardiologist (physician) and Advanced Practice Providers (APPs -  Physician Assistants and Nurse Practitioners) who all work together to provide you with the care you need, when you need it. You will need a follow up appointment in 6 months.  Please call our office 2 months in advance to schedule this appointment.  You may see [name], MD or one of the following Advanced Practice Providers on your designated Care Team:   Murray Hodgkins, NP Christell Faith, PA-C . Marrianne Mood, PA-C  Any Other Special Instructions Will Be Listed Below (If Applicable). N/A

## 2018-10-28 NOTE — Progress Notes (Signed)
Cardiology Office Note   Date:  10/28/2018   ID:  Devin Hanna, DOB 09/22/1950, MRN [id number]  PCP:  [name], MD  Cardiologist:   [name], MD   Chief Complaint  Patient presents with  . other    6 month f/u no complaints today. Meds reviewed verbally with pt.      History of Present Illness: Devin Hanna is a 68 y.o. male who presents for a followup visit regarding coronary artery disease and chronic systolic heart failure.  He has known CAD with past RCA stents and prior MI, chronic systolic heart failure, peripheral neuropathy, left bundle branch block, prior GI bleed GERD, DM, HTN and HLD.  Most recent hospitalization was in May of 2018 for acute on chronic systolic heart failure.  Most recent echocardiogram in June of this year showed an EF of 35 to 40% which has been stable.  He was transitioned from ACE inhibitor to [place] this year.  He had hypotension on the higher dose which had to be decreased.  Since then, he has been stable overall.  He is feeling well and denies any chest pain.  He describes stable exertional dyspnea with no orthopnea, PND or worsening leg edema.  No side effects with medications.  His chronic kidney disease have been stable.  Diabetes control has improved from a hemoglobin A1c of 8.7-6.7.    Past Medical History:  Diagnosis Date  . Acute respiratory failure with hypoxia ([place]) 06/2016  . CAD (coronary artery disease)    a. Remote PCI of the RCA;  b. 03/2013 Inf STEMI/PCI: LAD 60-70d, RCA 99p ISR(rota/CBA/DES);  c. 09/2014 MV: inf and infsept infarct, EF 35-40%;  d. 09/2014 Cath: patent RCA stent, stable LAD dzs, EF 40%-->Med rx.  . Diabetes mellitus    sees Dr. Hardin Negus in [place]  . GERD (gastroesophageal reflux disease)   . HFrEF (heart failure with reduced ejection fraction) ([place])   . Hyperlipidemia   . Ischemic cardiomyopathy    a. 09/2014 EF 40% by V gram;  b. 06/2016 Echo: EF 40, diff HK, Gr1 DD, inf HK, no mural  thrombus. Mild MR, mildly dil LA, mildly dil RV.   . LBBB (left bundle branch block)    Old  . Morbid obesity ([place])   . Peripheral neuropathy   . Upper GI bleed   . Vertigo     Past Surgical History:  Procedure Laterality Date  . CARDIAC CATHETERIZATION N/A 10/03/2014   Procedure: Left Heart Cath and Coronary Angiography;  Surgeon: [name], MD;  Location: [place] CV LAB;  Service: Cardiovascular;  Laterality: N/A;  . CIRCUMCISION N/A 07/22/2015   Procedure: CIRCUMCISION ADULT;  Surgeon: Hollice Espy, MD;  Location: [facility] ORS;  Service: Urology;  Laterality: N/A;  . CORONARY STENT PLACEMENT    . ESOPHAGOGASTRODUODENOSCOPY (EGD) WITH PROPOFOL N/A 09/12/2014   Procedure: ESOPHAGOGASTRODUODENOSCOPY (EGD) WITH PROPOFOL;  Surgeon: Ronald Lobo, MD;  [facility] ENDOSCOPY;  Service: Endoscopy;  Laterality: N/A;  . EXTERNAL FIXATION LEG Left 04/03/2012   Procedure: EXTERNAL FIXATION LEG;  Surgeon: Wylene Simmer, MD;  Location: [place];  Service: Orthopedics;  Laterality: Left;  . EXTERNAL FIXATION REMOVAL Left 04/14/2012   Procedure: REMOVAL EXTERNAL FIXATION LEG;  Surgeon: Wylene Simmer, MD;  Location: [place];  Service: Orthopedics;  Laterality: Left;  . IRRIGATION AND DEBRIDEMENT KNEE Left 04/03/2012   Procedure: IRRIGATION AND DEBRIDEMENT KNEE;  Surgeon: Wylene Simmer, MD;  Location: [place];  Service: Orthopedics;  Laterality: Left;  . LEFT HEART CATHETERIZATION WITH CORONARY ANGIOGRAM N/A 04/05/2013   Procedure: LEFT HEART CATHETERIZATION WITH CORONARY ANGIOGRAM;  Surgeon: [name], MD;  Location: [place] CATH LAB;  Service: Cardiovascular;  Laterality: N/A;  . MASS EXCISION N/A 07/22/2015   Procedure: EXCISION PENILE MASS;  Surgeon: Hollice Espy, MD;  Location: [facility] ORS;  Service: Urology;  Laterality: N/A;  . ORIF TIBIA PLATEAU Left 04/14/2012   Procedure: OPEN REDUCTION INTERNAL FIXATION (ORIF) TIBIAL PLATEAU;  Surgeon: Wylene Simmer, MD;  Location: [place];  Service: Orthopedics;   Laterality: Left;  . PERCUTANEOUS CORONARY ROTOBLATOR INTERVENTION (PCI-R) N/A 04/06/2013   Procedure: PERCUTANEOUS CORONARY ROTOBLATOR INTERVENTION (PCI-R);  Surgeon: [name], MD;  Location: [facility] CATH LAB;  Service: Cardiovascular;  Laterality: N/A;     Current Outpatient Medications  Medication Sig Dispense Refill  . atorvastatin (LIPITOR) 40 MG tablet Take 1 tablet (40 mg total) by mouth daily. 90 tablet 0  . carvedilol (COREG) 12.5 MG tablet Take 1 tablet (12.5 mg total) by mouth 2 (two) times daily with a meal. 180 tablet 3  . Cinnamon 500 MG capsule Take 1,000 mg by mouth 2 (two) times daily with a meal.    . clopidogrel (PLAVIX) 75 MG tablet TAKE ONE TABLET BY MOUTH EVERY MORNING WITH BREAKFAST 90 tablet 2  . ferrous sulfate (FERROUSUL) 325 (65 FE) MG tablet Take 1 tablet (325 mg total) by mouth 2 (two) times daily with a meal. 60 tablet 5  . furosemide (LASIX) 20 MG tablet TAKE 2 TABLETS BY MOUTH EVERY MORNING AND TAKE 1 TABLET EVERY EVENING 270 tablet 3  . gabapentin (NEURONTIN) 300 MG capsule TAKE 1 CAPSULE 3 TIMES DAILY 270 capsule 1  . glipiZIDE (GLUCOTROL XL) 10 MG 24 hr tablet TAKE ONE TABLET BY MOUTH EVERY DAY WITH BREAKFAST 90 tablet 2  . [name] 100 MG tablet TAKE ONE TABLET BY MOUTH EVERY DAY 90 tablet 3  . metFORMIN (GLUCOPHAGE-XR) 500 MG 24 hr tablet TAKE TWO TABLETS BY MOUTH TWICE DAILY WITH A MEAL 360 tablet 0  . Multiple Vitamin (MULTIVITAMIN WITH MINERALS) TABS Take 1 tablet by mouth daily.    . nitroGLYCERIN (NITROSTAT) 0.4 MG SL tablet Place 1 tablet (0.4 mg total) under the tongue every 5 (five) minutes as needed for chest pain. 25 tablet 3  . pantoprazole (PROTONIX) 40 MG tablet TAKE ONE TABLET BY MOUTH TWICE DAILY 180 tablet 2  . sacubitril-valsartan (ENTRESTO) 24-26 MG Take 1 tablet by mouth 2 (two) times daily. 60 tablet 11  . spironolactone (ALDACTONE) 25 MG tablet TAKE 1/2 TABLET EVERY DAY 30 tablet 3   No current facility-administered medications for  this visit.     Allergies:   Patient has no known allergies.    Social History:  The patient  reports that he quit smoking about 23 years ago. He has quit using smokeless tobacco. He reports that he does not drink alcohol or use drugs.   Family History:  The patient's family history includes Breast cancer in his sister; Coronary artery disease in his brother and father; Lung cancer in his brother; Prostate cancer in his brother; Stroke in his sister.    ROS:  Please see the history of present illness.   Otherwise, review of systems are positive for none.   All other systems are reviewed and negative.    PHYSICAL EXAM: VS:  BP 128/78 (BP Location: Left Arm, Patient Position: Sitting, Cuff Size: Large)   Pulse 72   Ht  5' 7"  (1.702 m)   Wt 260 lb 8 oz (118.2 kg)   SpO2 98%   BMI 40.80 kg/m  , BMI Body mass index is 40.8 kg/m. GEN: Well nourished, well developed, in no acute distress  HEENT: normal  Neck:no JVD, no carotid bruits, or masses Cardiac: RRR; no  rubs, or gallops. . 2/6 systolic ejection murmur in the aortic area which is early peaking . Trace leg edema Respiratory:  clear to auscultation bilaterally, normal work of breathing GI: soft, nontender, nondistended, + BS MS: no deformity or atrophy  Skin: warm and dry, no rash Neuro:  Strength and sensation are intact Psych: euthymic mood, full affect   EKG:  EKG is ordered today. The ekg ordered today demonstrates normal sinus rhythm with nonspecific IVCD and possible old inferior infarct.   Recent Labs: 03/30/2018: ALT 37 08/05/2018: Hemoglobin 13.1; Platelets 166 10/26/2018: BUN 18; Creatinine, Ser 1.53; Potassium 4.0; Sodium 138    Lipid Panel    Component Value Date/Time   CHOL 121 03/30/2018 0930   CHOL 103 06/07/2014 0824   TRIG 185 (H) 03/30/2018 0930   HDL 36 (L) 03/30/2018 0930   HDL 38 (L) 06/07/2014 0824   CHOLHDL 3.4 03/30/2018 0930   VLDL 19.0 02/12/2017 0902   LDLCALC 59 03/30/2018 0930      Wt  Readings from Last 3 Encounters:  10/28/18 260 lb 8 oz (118.2 kg)  10/26/18 258 lb (117 kg)  04/19/18 251 lb 8 oz (114.1 kg)        ASSESSMENT AND PLAN:  1.  Coronary artery disease involving native coronary arteries without angina:: He is doing reasonably well with no anginal symptoms. Continue medical therapy. He is to stay on Plavix without aspirin due to prior gastric ulcers.  2. Chronic systolic heart failure:  Appears to be euvolemic on current dose of furosemide.  He is on optimal medical therapy including carvedilol, Entresto and spironolactone with stable chronic kidney disease.  3. Essential hypertension: Blood pressure is well controlled on current medications.  4. Hyperlipidemia: Continue treatment with atorvastatin. LDL has been consistently below 70.  Most recently it was 56  5.  Type 2 diabetes.  Diabetes control has improved.  6.  Obesity: I discussed with him the importance of regular exercise and weight loss.  He reports limitations related to back pain and neuropathy.   Disposition:   FU with me in 6 months  Signed,  [name], MD  10/28/2018 8:27 AM    [place]

## 2018-11-05 ENCOUNTER — Other Ambulatory Visit: Payer: Self-pay | Admitting: Family Medicine

## 2018-11-05 DIAGNOSIS — Z862 Personal history of diseases of the blood and blood-forming organs and certain disorders involving the immune mechanism: Secondary | ICD-10-CM

## 2018-11-11 ENCOUNTER — Telehealth: Payer: Self-pay | Admitting: *Deleted

## 2018-11-11 DIAGNOSIS — N1831 Chronic kidney disease, stage 3a: Secondary | ICD-10-CM

## 2018-11-11 NOTE — Telephone Encounter (Signed)
Patient's daughter calling for results:  Notes recorded by [name], MD on 11/05/2018 at 2:28 PM EDT  Please let the patient and his daughter know that his iron studies are normal. He should discontinue the iron supplement and have this rechecked in 1 month. Orders have been placed. His kidney function is relatively stable over the last several checks. He meets criteria for chronic kidney disease at this time. I would suggest that we do an ultrasound of his kidneys to evaluate for any structural issues that may be contributing. I can order this once you speak with him. His A1c is 6.7 and is adequately controlled. Thanks.  Results and recommendations reviewed. They would like to precede with [place] for kidney- so please order that and there is an appointment scheduled on 10/27 and she would like to know if labs can be done that day- if not can schedule the following week at that appointment.

## 2018-11-12 NOTE — Addendum Note (Signed)
Addended by: [name] on: 11/12/2018 05:40 AM   Modules accepted: Orders

## 2018-11-12 NOTE — Telephone Encounter (Signed)
[place] ordered. It will be fine to recheck his labs on the 27th.

## 2018-11-17 ENCOUNTER — Telehealth: Payer: Self-pay

## 2018-11-17 DIAGNOSIS — N1831 Chronic kidney disease, stage 3a: Secondary | ICD-10-CM

## 2018-11-17 NOTE — Telephone Encounter (Signed)
Copied from [place] [id number]. Topic: General - Call Back - No Documentation >> Nov 16, 2018  5:04 PM Yvette Rack wrote: Reason for CRM: Pt daughter Myra Gianotti stated she was just returning the call to the office. >> Nov 17, 2018  8:18 AM Keene Breath wrote: Patient's relative, [name], is returning a call to [facility].  Please call her back at [phone]

## 2018-11-17 NOTE — Telephone Encounter (Signed)
Copied from [place] [id number]. Topic: General - Call Back - No Documentation >> Nov 16, 2018  5:04 PM Yvette Rack wrote: Reason for CRM: Pt daughter Myra Gianotti stated she was just returning the call to the office. >> Nov 17, 2018  8:18 AM Keene Breath wrote: Patient's relative, [name], is returning a call to [facility].  Please call her back at [phone]

## 2018-11-18 ENCOUNTER — Other Ambulatory Visit: Payer: Self-pay

## 2018-11-18 ENCOUNTER — Ambulatory Visit (INDEPENDENT_AMBULATORY_CARE_PROVIDER_SITE_OTHER): Payer: PPO

## 2018-11-18 ENCOUNTER — Telehealth: Payer: Self-pay

## 2018-11-18 ENCOUNTER — Encounter: Payer: Self-pay | Admitting: Podiatry

## 2018-11-18 ENCOUNTER — Other Ambulatory Visit: Payer: Self-pay | Admitting: Family Medicine

## 2018-11-18 ENCOUNTER — Other Ambulatory Visit: Payer: Self-pay | Admitting: Podiatry

## 2018-11-18 ENCOUNTER — Ambulatory Visit (INDEPENDENT_AMBULATORY_CARE_PROVIDER_SITE_OTHER): Payer: PPO | Admitting: Podiatry

## 2018-11-18 DIAGNOSIS — E1142 Type 2 diabetes mellitus with diabetic polyneuropathy: Secondary | ICD-10-CM

## 2018-11-18 DIAGNOSIS — M79672 Pain in left foot: Secondary | ICD-10-CM | POA: Diagnosis not present

## 2018-11-18 DIAGNOSIS — M79671 Pain in right foot: Secondary | ICD-10-CM

## 2018-11-18 DIAGNOSIS — E0843 Diabetes mellitus due to underlying condition with diabetic autonomic (poly)neuropathy: Secondary | ICD-10-CM

## 2018-11-18 DIAGNOSIS — M722 Plantar fascial fibromatosis: Secondary | ICD-10-CM

## 2018-11-18 DIAGNOSIS — R600 Localized edema: Secondary | ICD-10-CM

## 2018-11-18 MED ORDER — MELOXICAM 15 MG PO TABS: 15.0000 mg | ORAL_TABLET | Freq: Every day | ORAL | 1 refills | Status: DC

## 2018-11-18 NOTE — Telephone Encounter (Signed)
Copied from [place] [phone]. Topic: General - Call Back - No Documentation >> Nov 16, 2018  5:04 PM Yvette Rack wrote: Reason for CRM: Pt daughter Myra Gianotti stated she was just returning the call to the office. >> Nov 17, 2018  8:18 AM Keene Breath wrote: Patient's relative, [name], is returning a call to [facility].  Please call her back at [phone]

## 2018-11-18 NOTE — Telephone Encounter (Signed)
Returned pt's daughter, Evette Georges call.  No answer.  Left detailed message (ok per DPR) informing her that there is no one who works in this clinic by the name of Larene Beach.

## 2018-11-20 NOTE — Telephone Encounter (Signed)
It is fine to wait until the 27th for his labs.  I have placed the order for the ultrasound.  Thanks.

## 2018-11-20 NOTE — Addendum Note (Signed)
Addended by: [name] on: 11/20/2018 09:23 AM   Modules accepted: Orders

## 2018-11-21 NOTE — Progress Notes (Signed)
   HPI: 68 y.o. male presenting today as a new patient with a chief complaint of numbness, tingling and stiffness of the bilateral feet that has been progressively worsening over the past 2-3 years. Walking increases the pain. He has been taking Gabapentin for treatment. Patient is here for further evaluation and treatment.   Past Medical History:  Diagnosis Date  . Acute respiratory failure with hypoxia ([place]) 06/2016  . CAD (coronary artery disease)    a. Remote PCI of the RCA;  b. 03/2013 Inf STEMI/PCI: LAD 60-70d, RCA 99p ISR(rota/CBA/DES);  c. 09/2014 MV: inf and infsept infarct, EF 35-40%;  d. 09/2014 Cath: patent RCA stent, stable LAD dzs, EF 40%-->Med rx.  . Diabetes mellitus    sees Dr. Hardin Negus in [place]  . GERD (gastroesophageal reflux disease)   . HFrEF (heart failure with reduced ejection fraction) ([place])   . Hyperlipidemia   . Ischemic cardiomyopathy    a. 09/2014 EF 40% by V gram;  b. 06/2016 Echo: EF 40, diff HK, Gr1 DD, inf HK, no mural thrombus. Mild MR, mildly dil LA, mildly dil RV.   . LBBB (left bundle branch block)    Old  . Morbid obesity ([place])   . Peripheral neuropathy   . Upper GI bleed   . Vertigo      Physical Exam: General: The patient is alert and oriented x3 in no acute distress.  Dermatology: Skin is warm, dry and supple bilateral lower extremities. Negative for open lesions or macerations.  Vascular: Edema noted to the bilateral lower extremities. Palpable pedal pulses bilaterally. No erythema noted. Capillary refill within normal limits.  Neurological: Epicritic and protective threshold diminished bilaterally.   Musculoskeletal Exam: Range of motion within normal limits to all pedal and ankle joints bilateral. Muscle strength 5/5 in all groups bilateral.   Radiographic Exam:  Normal osseous mineralization. Joint spaces preserved. No fracture/dislocation/boney destruction.    Assessment: 1. Generalized foot pain 2. BLE edema 3. DM II with  peripheral polyneuropathy    Plan of Care:  1. Patient evaluated. X-Rays reviewed.  2. Prescription for Meloxicam provided to patient. 3. Continue taking Gabapentin as directed by PCP.  4. Appointment with Liliane Channel, Pedorthist, for DM shoes and insoles. 5. Continue using compression socks daily.  6. Return to clinic as needed.         Edrick Kins, DPM [facility]  Dr. Edrick Kins, DPM    [address], [place] [address]                Office [phone]  Fax [phone]

## 2018-11-25 ENCOUNTER — Ambulatory Visit: Payer: PPO

## 2018-11-29 ENCOUNTER — Telehealth: Payer: Self-pay | Admitting: Family Medicine

## 2018-11-29 ENCOUNTER — Ambulatory Visit
Admission: RE | Admit: 2018-11-29 | Discharge: 2018-11-29 | Disposition: A | Discharge status: Home / Self Care | Payer: PPO | Source: Ambulatory Visit | Attending: Family Medicine | Admitting: Family Medicine

## 2018-11-29 ENCOUNTER — Other Ambulatory Visit: Payer: Self-pay

## 2018-11-29 DIAGNOSIS — K769 Liver disease, unspecified: Secondary | ICD-10-CM

## 2018-11-29 DIAGNOSIS — N1831 Chronic kidney disease, stage 3a: Secondary | ICD-10-CM | POA: Diagnosis not present

## 2018-11-29 DIAGNOSIS — N189 Chronic kidney disease, unspecified: Secondary | ICD-10-CM | POA: Diagnosis not present

## 2018-11-29 DIAGNOSIS — N281 Cyst of kidney, acquired: Secondary | ICD-10-CM | POA: Diagnosis not present
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
## 2018-11-30 ENCOUNTER — Ambulatory Visit: Payer: PPO

## 2018-11-30 NOTE — Addendum Note (Signed)
Addended by: [name] on: 11/30/2018 05:50 PM   Modules accepted: Orders

## 2018-11-30 NOTE — Telephone Encounter (Signed)
MRI has been ordered.  I do not believe the liver lesion could have caused a change in his kidney lab work.  The MRI will give [place] a better idea of what the lesion is.  If they do not hear anything about this being scheduled in the next week they should contact us.

## 2018-12-06 ENCOUNTER — Ambulatory Visit (INDEPENDENT_AMBULATORY_CARE_PROVIDER_SITE_OTHER): Payer: PPO | Admitting: Family Medicine

## 2018-12-06 ENCOUNTER — Other Ambulatory Visit: Payer: Self-pay

## 2018-12-06 ENCOUNTER — Encounter: Payer: Self-pay | Admitting: Family Medicine

## 2018-12-06 VITALS — BP 140/70 | HR 71 | Temp 97.0°F | Ht 67.0 in | Wt 260.1 lb

## 2018-12-06 DIAGNOSIS — K769 Liver disease, unspecified: Secondary | ICD-10-CM | POA: Diagnosis not present

## 2018-12-06 DIAGNOSIS — N1831 Chronic kidney disease, stage 3a: Secondary | ICD-10-CM

## 2018-12-06 DIAGNOSIS — Z862 Personal history of diseases of the blood and blood-forming organs and certain disorders involving the immune mechanism: Secondary | ICD-10-CM | POA: Diagnosis not present

## 2018-12-06 DIAGNOSIS — T148XXA Other injury of unspecified body region, initial encounter: Secondary | ICD-10-CM

## 2018-12-06 DIAGNOSIS — I5022 Chronic systolic (congestive) heart failure: Secondary | ICD-10-CM

## 2018-12-06 DIAGNOSIS — I10 Essential (primary) hypertension: Secondary | ICD-10-CM

## 2018-12-06 DIAGNOSIS — S80812D Abrasion, left lower leg, subsequent encounter: Secondary | ICD-10-CM | POA: Diagnosis not present

## 2018-12-06 DIAGNOSIS — N183 Chronic kidney disease, stage 3 unspecified: Secondary | ICD-10-CM | POA: Insufficient documentation

## 2018-12-06 LAB — CBC
HCT: 38.6 % — ABNORMAL LOW (ref 39.0–52.0)
Hemoglobin: 13.1 g/dL (ref 13.0–17.0)
MCHC: 34.1 g/dL (ref 30.0–36.0)
MCV: 96.6 fl (ref 78.0–100.0)
Platelets: 171 10*3/uL (ref 150.0–400.0)
RBC: 3.99 Mil/uL — ABNORMAL LOW (ref 4.22–5.81)
RDW: 13.3 % (ref 11.5–15.5)
WBC: 7.2 10*3/uL (ref 4.0–10.5)

## 2018-12-06 NOTE — Assessment & Plan Note (Signed)
Discussed getting MRI scheduled.  Referral coordinator will call the patient's daughter to get this scheduled.

## 2018-12-06 NOTE — Assessment & Plan Note (Signed)
Healing well.  Discussed monitoring for signs of infection seeking medical attention if infection symptoms occurred.

## 2018-12-06 NOTE — Progress Notes (Signed)
  [name], MD Phone: [phone]  Devin Hanna is a 68 y.o. male who presents today for f/u.  HYPERTENSION/CHF  Disease Monitoring  Home BP Monitoring 106-139/55-77  Chest pain- no    Dyspnea- chronic and stable Medications  Compliance-  Taking coreg, lasix, spironolactone, entresto.   Edema- chronic, slight worsening Orthopnea- no       PND- no Weight gain- progressive over several months with no change in diet or activity levels  Liver lesion: no RUQ pain. Noted incidentally on renal US.  History of anemia: he stopped iron supplementation. No blood in his stool.  Skin scrape: Patient notes he scraped his left lower shin on a piece of metal about a week ago.  He notes it is healing up quite well.  No redness.  No drainage.  Tetanus vaccine is up-to-date.    Social History   Tobacco Use  Smoking Status Former Smoker  . Quit date: 04/05/1995  . Years since quitting: 23.6  Smokeless Tobacco Former User     ROS see history of present illness  Objective  Physical Exam Vitals:   12/06/18 0813  BP: 140/70  Pulse: 71  Temp: (!) 97 F (36.1 C)  SpO2: 99%    BP Readings from Last 3 Encounters:  12/06/18 140/70  10/28/18 128/78  10/26/18 (!) 160/80   Wt Readings from Last 3 Encounters:  12/06/18 260 lb 1.9 oz (118 kg)  10/28/18 260 lb 8 oz (118.2 kg)  10/26/18 258 lb (117 kg)    Physical Exam Constitutional:      General: He is not in acute distress.    Appearance: He is not diaphoretic.  Cardiovascular:     Rate and Rhythm: Normal rate and regular rhythm.     Heart sounds: Normal heart sounds.  Pulmonary:     Effort: Pulmonary effort is normal.     Breath sounds: Normal breath sounds.  Abdominal:     General: Bowel sounds are normal. There is no distension.     Palpations: Abdomen is soft.     Tenderness: There is no abdominal tenderness. There is no guarding or rebound.  Musculoskeletal:     Comments: 1+ pitting edema bilaterally lower  extremities  Skin:    General: Skin is warm and dry.       Neurological:     Mental Status: He is alert.      Assessment/Plan: Please see individual problem list.  HTN (hypertension) Well-controlled.  Continue current regimen.  Chronic kidney disease (CKD), stage III (moderate) Patient completed renal ultrasound.  Continue adequate blood pressure control.  Liver lesion Discussed getting MRI scheduled.  Referral coordinator will call the patient's daughter to get this scheduled.  History of anemia Recheck iron studies and CBC.  Abrasion Healing well.  Discussed monitoring for signs of infection seeking medical attention if infection symptoms occurred.  Chronic systolic heart failure ([place]) Patient has had some progressive weight gain over a number of months.  Also has some swelling in his lower extremities.  We will trial an increased dose of Lasix with an extra tablet later in the day for 2 days to see if that helps with his swelling.  He will return to his usual regimen after 2 days.   Orders Placed This Encounter  Procedures  . CBC  . IBC + Ferritin    No orders of the defined types were placed in this encounter.    [name], MD [place]

## 2018-12-06 NOTE — Patient Instructions (Signed)
Nice to see you. Please take an extra pill of Lasix in the afternoon to help with your swelling and weight gain.  Please do this for 2 days.  Please contact us and let us know how your weight has done and how your swelling is doing. Somebody will contact you to get the MRI scheduled of your liver. Please monitor the scrape on your left shin.  If you develop redness, warmth, swelling, pain, or drainage of pus please seek medical attention.

## 2018-12-06 NOTE — Assessment & Plan Note (Signed)
Recheck iron studies and CBC.

## 2018-12-06 NOTE — Assessment & Plan Note (Signed)
Patient completed renal ultrasound.  Continue adequate blood pressure control.

## 2018-12-06 NOTE — Assessment & Plan Note (Signed)
Well-controlled.  Continue current regimen. 

## 2018-12-06 NOTE — Assessment & Plan Note (Signed)
Patient has had some progressive weight gain over a number of months.  Also has some swelling in his lower extremities.  We will trial an increased dose of Lasix with an extra tablet later in the day for 2 days to see if that helps with his swelling.  He will return to his usual regimen after 2 days.

## 2018-12-07 ENCOUNTER — Telehealth: Payer: Self-pay | Admitting: Family Medicine

## 2018-12-07 NOTE — Telephone Encounter (Signed)
I called pt and left a vm to call ofc to schedule Return in about 3 months (around 03/08/2019).

## 2018-12-09 LAB — IBC + FERRITIN
Ferritin: 43 ng/mL (ref 22.0–322.0)
Iron: 100 ug/dL (ref 42–165)
Saturation Ratios: 25.6 % (ref 20.0–50.0)
Transferrin: 279 mg/dL (ref 212.0–360.0)

## 2018-12-13 ENCOUNTER — Telehealth: Payer: Self-pay | Admitting: *Deleted

## 2018-12-13 NOTE — Telephone Encounter (Signed)
Reviewed lab results and physician's note with patient's daughter, [name]. No questions asked.

## 2018-12-14 ENCOUNTER — Ambulatory Visit: Payer: PPO | Admitting: Orthotics

## 2018-12-14 ENCOUNTER — Other Ambulatory Visit: Payer: Self-pay

## 2018-12-14 DIAGNOSIS — M2140 Flat foot [pes planus] (acquired), unspecified foot: Secondary | ICD-10-CM

## 2018-12-14 DIAGNOSIS — I872 Venous insufficiency (chronic) (peripheral): Secondary | ICD-10-CM

## 2018-12-14 DIAGNOSIS — E0843 Diabetes mellitus due to underlying condition with diabetic autonomic (poly)neuropathy: Secondary | ICD-10-CM

## 2018-12-14 NOTE — Progress Notes (Signed)

## 2018-12-16 ENCOUNTER — Ambulatory Visit
Admission: RE | Admit: 2018-12-16 | Discharge: 2018-12-16 | Disposition: A | Discharge status: Home / Self Care | Payer: PPO | Source: Ambulatory Visit | Attending: Family Medicine | Admitting: Family Medicine

## 2018-12-16 ENCOUNTER — Other Ambulatory Visit: Payer: Self-pay | Admitting: Family Medicine

## 2018-12-16 ENCOUNTER — Other Ambulatory Visit: Payer: Self-pay

## 2018-12-16 DIAGNOSIS — K769 Liver disease, unspecified: Secondary | ICD-10-CM | POA: Diagnosis not present

## 2018-12-16 DIAGNOSIS — K8020 Calculus of gallbladder without cholecystitis without obstruction: Secondary | ICD-10-CM | POA: Diagnosis not present

## 2018-12-16 LAB — POCT I-STAT CREATININE: Creatinine, Ser: 1.6 mg/dL — ABNORMAL HIGH (ref 0.61–1.24)

## 2018-12-16 MED ORDER — GADOBUTROL 1 MMOL/ML IV SOLN
10.0000 mL | Freq: Once | INTRAVENOUS | Status: AC | PRN
  Administered 2018-12-16: 11:00:00 10 mL via INTRAVENOUS

## 2018-12-19 ENCOUNTER — Telehealth: Payer: Self-pay | Admitting: *Deleted

## 2018-12-19 ENCOUNTER — Other Ambulatory Visit: Payer: Self-pay | Admitting: Podiatry

## 2018-12-19 DIAGNOSIS — K7460 Unspecified cirrhosis of liver: Secondary | ICD-10-CM

## 2018-12-19 DIAGNOSIS — K769 Liver disease, unspecified: Secondary | ICD-10-CM

## 2018-12-19 NOTE — Telephone Encounter (Signed)
I called and spoke with the patient's daughter regarding the MRI findings.  Discussed that they did not give a specific diagnosis for the lesion seen in his liver and reported it as an intermediate probability of a cancerous lesion.  Discussed that the best next step would be seeing GI relatively quickly to determine if he needed a biopsy or follow-up imaging after a period of time to monitor the lesion.  I discussed that the lesion could potentially be benign though there is a possibility that it could be a cancerous process and that is a reason to see a specialist for potential biopsy.  I have placed a referral.  I will discuss with our referral coordinator to get this sent over.

## 2018-12-19 NOTE — Addendum Note (Signed)
Addended by: [name] on: 12/19/2018 09:57 AM   Modules accepted: Orders

## 2018-12-19 NOTE — Telephone Encounter (Signed)
Pt's daughter, Myra Gianotti, called for results of his liver MRI from 12/16/2018; she missed a previous call from Dr [name]; she would like to discuss the findings with Dr [name]; explained referral is in progress;she verbalized understanding; she can be contacted at [phone].

## 2018-12-20 ENCOUNTER — Other Ambulatory Visit: Payer: Self-pay

## 2018-12-20 ENCOUNTER — Encounter: Payer: Self-pay | Admitting: Gastroenterology

## 2018-12-20 ENCOUNTER — Ambulatory Visit (INDEPENDENT_AMBULATORY_CARE_PROVIDER_SITE_OTHER): Payer: PPO | Admitting: Gastroenterology

## 2018-12-20 VITALS — BP 124/75 | HR 76 | Temp 98.6°F | Ht 66.0 in | Wt 263.8 lb

## 2018-12-20 DIAGNOSIS — R160 Hepatomegaly, not elsewhere classified: Secondary | ICD-10-CM | POA: Diagnosis not present

## 2018-12-20 NOTE — Progress Notes (Signed)
Gastroenterology Consultation  Referring Provider:     [name], MD Primary Care Physician:  [name], MD Primary Gastroenterologist:  Dr. Allen [place]     Reason for Consultation:     Cirrhosis and liver lesion        HPI:   Devin Hanna is a 68 y.o. y/o male referred for consultation & management of cirrhosis and liver lesion by Dr. [name], Angela Adam, MD.  This patient comes in today after being seen in the past by another GI group by Dr. Rayann Heman.  At that time the patient was seen for abnormal liver enzymes and a negative work-up.  The patient was recommended to follow-up at the [facility] but it does not appear the patient followed up with that.  The patient's last colonoscopy appears to have been in 2010.  The patient had an upper endoscopy and 2016 for a upper GI bleed.  The patient's most recent MRI of the liver showed`:  IMPRESSION: 1. Nodular hepatic contour with asymmetric enlargement of the lateral segment left liver suggests cirrhosis. Prominent venous collateralization in the left upper quadrant with subtleparaesophageal varices suggest portal venous hypertension. 2. 1.8 cm hypervascular lesion in the anterior right liver without non peripheral washout or rim enhancing capsule. Imaging features compatible with LI-RADS 3 category lesion. 3. Cholelithiasis  The patient reports that he has been gaining weight and states that he is eating the same amount of food.  The patient states that he was not really sure why he was here today except that he thought he may be here to set up a colonoscopy.  There is no report of any black stools or bloody stools.  He also denies any abdominal pain.  He does report some lower extremity swelling.   Past Medical History:  Diagnosis Date   Acute respiratory failure with hypoxia ([place]) 06/2016   CAD (coronary artery disease)    a. Remote PCI of the RCA;  b. 03/2013 Inf STEMI/PCI: LAD 60-70d, RCA 99p ISR(rota/CBA/DES);   c. 09/2014 MV: inf and infsept infarct, EF 35-40%;  d. 09/2014 Cath: patent RCA stent, stable LAD dzs, EF 40%-->Med rx.   Diabetes mellitus    sees Dr. Hardin Negus in [place]   GERD (gastroesophageal reflux disease)    HFrEF (heart failure with reduced ejection fraction) ([place])    Hyperlipidemia    Ischemic cardiomyopathy    a. 09/2014 EF 40% by V gram;  b. 06/2016 Echo: EF 40, diff HK, Gr1 DD, inf HK, no mural thrombus. Mild MR, mildly dil LA, mildly dil RV.    LBBB (left bundle branch block)    Old   Morbid obesity ([facility])    Peripheral neuropathy    Upper GI bleed    Vertigo     Past Surgical History:  Procedure Laterality Date   CARDIAC CATHETERIZATION N/A 10/03/2014   Procedure: Left Heart Cath and Coronary Angiography;  Surgeon: [name], MD;  Location: [facility];  Service: Cardiovascular;  Laterality: N/A;   CIRCUMCISION N/A 07/22/2015   Procedure: CIRCUMCISION ADULT;  Surgeon: Hollice Espy, MD;  Location: [facility];  Service: Urology;  Laterality: N/A;   CORONARY STENT PLACEMENT     ESOPHAGOGASTRODUODENOSCOPY (EGD) WITH PROPOFOL N/A 09/12/2014   Procedure: ESOPHAGOGASTRODUODENOSCOPY (EGD) WITH PROPOFOL;  Surgeon: Ronald Lobo, MD;  Location: [facility];  Service: Endoscopy;  Laterality: N/A;   EXTERNAL FIXATION LEG Left 04/03/2012   Procedure: EXTERNAL FIXATION LEG;  Surgeon: Wylene Simmer,  MD;  Location: [place] [facility];  Service: Orthopedics;  Laterality: Left;   EXTERNAL FIXATION REMOVAL Left 04/14/2012   Procedure: REMOVAL EXTERNAL FIXATION LEG;  Surgeon: Wylene Simmer, MD;  Location: [place];  Service: Orthopedics;  Laterality: Left;   IRRIGATION AND DEBRIDEMENT KNEE Left 04/03/2012   Procedure: IRRIGATION AND DEBRIDEMENT KNEE;  Surgeon: Wylene Simmer, MD;  Location: [place];  Service: Orthopedics;  Laterality: Left;   LEFT HEART CATHETERIZATION WITH CORONARY ANGIOGRAM N/A 04/05/2013   Procedure: LEFT HEART CATHETERIZATION WITH CORONARY ANGIOGRAM;  Surgeon: [name], MD;  Location: [facility] C[facility];  Service: Cardiovascular;  Laterality: N/A;   MASS EXCISION N/A 07/22/2015   Procedure: EXCISION PENILE MASS;  Surgeon: Hollice Espy, MD;  Location: [facility];  Service: Urology;  Laterality: N/A;   ORIF TIBIA PLATEAU Left 04/14/2012   Procedure: OPEN REDUCTION INTERNAL FIXATION (ORIF) TIBIAL PLATEAU;  Surgeon: Wylene Simmer, MD;  Location: [place] [facility];  Service: Orthopedics;  Laterality: Left;   PERCUTANEOUS CORONARY ROTOBLATOR INTERVENTION (PCI-R) N/A 04/06/2013   Procedure: PERCUTANEOUS CORONARY ROTOBLATOR INTERVENTION (PCI-R);  Surgeon: [name], MD;  Location: [facility] C[facility] LAB;  Service: Cardiovascular;  Laterality: N/A;    Prior to Admission medications   Medication Sig Start Date End Date Taking? Authorizing Provider  atorvastatin (LIPITOR) 40 MG tablet Take 1 tablet (40 mg total) by mouth daily. 10/24/18   Theora Gianotti, NP  carvedilol (COREG) 12.5 MG tablet Take 1 tablet (12.5 mg total) by mouth 2 (two) times daily with a meal. 04/19/18   Theora Gianotti, NP  Cinnamon 500 MG capsule Take 1,000 mg by mouth 2 (two) times daily with a meal.    [provider]  clopidogrel (PLAVIX) 75 MG tablet TAKE ONE TABLET BY MOUTH EVERY MORNING WITH BREAKFAST 04/28/18   [name], MD  ferrous sulfate (FERROUSUL) 325 (65 FE) MG tablet Take 1 tablet (325 mg total) by mouth 2 (two) times daily with a meal. 11/01/14   [name], MD  furosemide (LASIX) 20 MG tablet TAKE 2 TABLETS BY MOUTH EVERY MORNING AND TAKE 1 TABLET EVERY EVENING 10/24/18   Minna Merritts, MD  gabapentin (NEURONTIN) 300 MG capsule TAKE 1 CAPSULE 3 TIMES DAILY 10/26/18   [name], MD  glipiZIDE (GLUCOTROL XL) 10 MG 24 hr tablet TAKE ONE TABLET BY MOUTH EVERY DAY WITH BREAKFAST 08/30/18   [name], MD  [name] 100 MG tablet Take 100 mg by mouth daily. 11/18/18   [provider]  meloxicam (MOBIC) 15 MG tablet TAKE 1 TABLET BY MOUTH DAILY  12/19/18   Edrick Kins, DPM  metFORMIN (GLUCOPHAGE-XR) 500 MG 24 hr tablet TAKE TWO TABLETS BY MOUTH TWICE DAILY WITH A MEAL 11/21/18   [name], MD  Multiple Vitamin (MULTIVITAMIN WITH MINERALS) TABS Take 1 tablet by mouth daily.    [provider]  nitroGLYCERIN (NITROSTAT) 0.4 MG SL tablet Place 1 tablet (0.4 mg total) under the tongue every 5 (five) minutes as needed for chest pain. 10/27/16   [name], MD  pantoprazole (PROTONIX) 40 MG tablet TAKE ONE TABLET BY MOUTH TWICE DAILY 04/28/18   [name], MD  quinapril (ACCUPRIL) 20 MG tablet Take 20 mg by mouth daily. 07/29/18   [provider]  sacubitril-valsartan (ENTRESTO) 24-26 MG Take 1 tablet by mouth 2 (two) times daily. 07/25/18   [name], MD  spironolactone (ALDACTONE) 25 MG tablet TAKE 1/2 TABLET EVERY DAY 10/26/18  [name], MD    Family History  Problem Relation Age of Onset   Coronary artery disease Father    Coronary artery disease Brother    Lung cancer Brother    Breast cancer Sister    Prostate cancer Brother    Stroke Sister      Social History   Tobacco Use   Smoking status: Former Smoker    Quit date: 04/05/1995    Years since quitting: 23.7   Smokeless tobacco: Former Systems developer  Substance Use Topics   Alcohol use: No    Comment: Occassional Use   Drug use: No    Allergies as of 12/20/2018   (No Known Allergies)    Review of Systems:    All systems reviewed and negative except where noted in HPI.   Physical Exam:  There were no vitals taken for this visit. No LMP for male patient. General:   Alert,  Well-developed, well-nourished, pleasant and cooperative in NAD Head:  Normocephalic and atraumatic. Eyes:  Sclera clear, no icterus.   Conjunctiva pink. Ears:  Normal auditory acuity. Neck:  Supple; no masses or thyromegaly. Lungs:  Respirations even and unlabored.  Clear throughout to auscultation.   No wheezes, crackles, or  rhonchi. No acute distress. Heart:  Regular rate and rhythm; no murmurs, clicks, rubs, or gallops. Abdomen:  Normal bowel sounds.  No bruits.  Soft, non-tender and non-distended without masses, hepatosplenomegaly or hernias noted.  No guarding or rebound tenderness.  Negative Carnett sign.   Rectal:  Deferred.  Msk:  Symmetrical without gross deformities.  Good, equal movement & strength bilaterally. Pulses:  Normal pulses noted. Extremities:  No clubbing.  Positive lower extremity edema.  No cyanosis. Neurologic:  Alert and oriented x3;  grossly normal neurologically. Skin:  Intact without significant lesions or rashes.  No jaundice. Lymph Nodes:  No significant cervical adenopathy. Psych:  Alert and cooperative. Normal mood and affect.  Imaging Studies: Mr Liver W Wo Contrast  Result Date: 12/16/2018 CLINICAL DATA:  Possible right hepatic mass on recent ultrasound. EXAM: MRI ABDOMEN WITHOUT AND WITH CONTRAST TECHNIQUE: Multiplanar multisequence MR imaging of the abdomen was performed both before and after the administration of intravenous contrast. CONTRAST:  55m GADAVIST GADOBUTROL 1 MMOL/ML IV SOLN COMPARISON:  Ultrasound exam 11/29/2018 FINDINGS: Lower chest: Unremarkable. Hepatobiliary: Nodular hepatic contour is associated with enlargement of the lateral segment left liver, features compatible with cirrhosis. Hypervascular lesion identified in the anterior right liver (segment V) visible on arterial postcontrast series 11, image 40). Lesion measures 1.8 x 1.3 cm on 40/13. Lesion washes out to background liver parenchymal levels, but not to a greater degree than the background liver. There is no enhancing capsule associated with the abnormality. This lesion is nearly isointense to background hepatic parenchyma on precontrast T1 and T2 weighted imaging. It does show mild diffusion restriction. No other suspicious focal abnormality within the liver parenchyma. Tiny stones are seen in the neck of  the gallbladder (19/3). No intrahepatic or extrahepatic biliary dilation. Pancreas: No focal mass lesion. No dilatation of the main duct. No intraparenchymal cyst. No peripancreatic edema. Spleen:  No splenomegaly. No focal mass lesion. Adrenals/Urinary Tract: No adrenal nodule or mass. 10 mm simple cyst noted lower pole right kidney. Left kidney unremarkable. Stomach/Bowel: Stomach is unremarkable. No gastric wall thickening. No evidence of outlet obstruction. Duodenum is normally positioned as is the ligament of Treitz. No small bowel or colonic dilatation within the visualized abdomen. Vascular/Lymphatic: No abdominal aortic aneurysm. Upper  normal hepato duodenal ligament lymph nodes evident, presumably related to the patient's underlying liver disease. No para-aortic lymphadenopathy. Portal vein, superior mesenteric vein, and splenic vein are patent. Prominent venous collateralization noted in the left upper quadrant with paraesophageal varices noted on image 19 of series 13. Other:  No intraperitoneal free fluid. Musculoskeletal: No abnormal marrow enhancement within the visualized bony anatomy. IMPRESSION: 1. Nodular hepatic contour with asymmetric enlargement of the lateral segment left liver suggests cirrhosis. Prominent venous collateralization in the left upper quadrant with subtle paraesophageal varices suggest portal venous hypertension. 2. 1.8 cm hypervascular lesion in the anterior right liver without non peripheral washout or rim enhancing capsule. Imaging features compatible with LI-RADS 3 category lesion. 3. Cholelithiasis Electronically Signed   By: Misty Stanley M.D.   On: 12/16/2018 13:28   US Renal  Result Date: 11/29/2018 CLINICAL DATA:  Chronic renal disease. EXAM: RENAL / URINARY TRACT ULTRASOUND COMPLETE COMPARISON:  Ultrasound 05/02/2015. FINDINGS: Right Kidney: Renal measurements: 9.3 x 5.2 x 5.1 cm = volume: 126.5 mL . Echogenicity within normal limits. No hydronephrosis visualized.  9 mm simple cyst right kidney. Left Kidney: Renal measurements: 11.6 x 5.4 x 5.7 cm = volume: 185.2 mL. Echogenicity within normal limits. No mass or hydronephrosis visualized. Bladder: Appears normal for degree of bladder distention. Right ureteral jet was not visualized. Other: Questionable 3.1 x 2.7 x 2.9 cm isoechoic mass in the right lobe of the liver. To further evaluate mass MRI of the liver suggested. IMPRESSION: 1. No acute renal abnormality identified. No hydronephrosis or bladder distention. Renal echogenicity is normal. 9 mm simple cyst in the right kidney. 2. Questionable 3.1 x 2.7 x 2.9 cm isoechoic mass in the right lobe of liver. To further evaluate MRI of the liver suggested. Electronically Signed   By: Marcello Moores  Register   On: 11/29/2018 08:39    Assessment and Plan:   MACALLAN ORD is a 68 y.o. y/o male who comes in today with a spot found on his liver that was suggestive of a hepatocellular carcinoma on his MRI.  The patient will be set up for blood test for his liver enzymes and for alpha-fetoprotein.  He has been told that if his alpha-fetoprotein is negative he may be considered for a colonoscopy to make sure that there is not a lesion in the colon that has metastasized to the liver although this is unlikely since the MRI was more consistent with a possible hepatocellular carcinoma.  The patient has been explained the plan and agrees with it.    Lucilla Lame, MD. Marval Regal    Note: This dictation was prepared with Dragon dictation along with smaller phrase technology. Any transcriptional errors that result from this process are unintentional.

## 2018-12-20 NOTE — H&P (View-Only) (Signed)
Gastroenterology Consultation  Referring Provider:     [name], MD Primary Care Physician:  [name], MD Primary Gastroenterologist:  Dr. Allen Norris     Reason for Consultation:     Cirrhosis and liver lesion        HPI:   Devin Hanna is a 68 y.o. y/o male referred for consultation & management of cirrhosis and liver lesion by Dr. [name], Angela Adam, MD.  This patient comes in today after being seen in the past by another GI group by Dr. Rayann Heman.  At that time the patient was seen for abnormal liver enzymes and a negative work-up.  The patient was recommended to follow-up at the [facility] but it does not appear the patient followed up with that.  The patient's last colonoscopy appears to have been in 2010.  The patient had an upper endoscopy and 2016 for a upper GI bleed.  The patient's most recent MRI of the liver showed`:  IMPRESSION: 1. Nodular hepatic contour with asymmetric enlargement of the lateral segment left liver suggests cirrhosis. Prominent venous collateralization in the left upper quadrant with subtleparaesophageal varices suggest portal venous hypertension. 2. 1.8 cm hypervascular lesion in the anterior right liver without non peripheral washout or rim enhancing capsule. Imaging features compatible with LI-RADS 3 category lesion. 3. Cholelithiasis  The patient reports that he has been gaining weight and states that he is eating the same amount of food.  The patient states that he was not really sure why he was here today except that he thought he may be here to set up a colonoscopy.  There is no report of any black stools or bloody stools.  He also denies any abdominal pain.  He does report some lower extremity swelling.   Past Medical History:  Diagnosis Date   Acute respiratory failure with hypoxia ([place]) 06/2016   CAD (coronary artery disease)    a. Remote PCI of the RCA;  b. 03/2013 Inf STEMI/PCI: LAD 60-70d, RCA 99p ISR(rota/CBA/DES);   c. 09/2014 MV: inf and infsept infarct, EF 35-40%;  d. 09/2014 Cath: patent RCA stent, stable LAD dzs, EF 40%-->Med rx.   Diabetes mellitus    sees Dr. Hardin Negus in [place]   GERD (gastroesophageal reflux disease)    HFrEF (heart failure with reduced ejection fraction) ([place])    Hyperlipidemia    Ischemic cardiomyopathy    a. 09/2014 EF 40% by V gram;  b. 06/2016 Echo: EF 40, diff HK, Gr1 DD, inf HK, no mural thrombus. Mild MR, mildly dil LA, mildly dil RV.    LBBB (left bundle branch block)    Old   Morbid obesity ([facility])    Peripheral neuropathy    Upper GI bleed    Vertigo     Past Surgical History:  Procedure Laterality Date   CARDIAC CATHETERIZATION N/A 10/03/2014   Procedure: Left Heart Cath and Coronary Angiography;  Surgeon: [name], MD;  Location: [facility];  Service: Cardiovascular;  Laterality: N/A;   CIRCUMCISION N/A 07/22/2015   Procedure: CIRCUMCISION ADULT;  Surgeon: Hollice Espy, MD;  Location: [facility];  Service: Urology;  Laterality: N/A;   CORONARY STENT PLACEMENT     ESOPHAGOGASTRODUODENOSCOPY (EGD) WITH PROPOFOL N/A 09/12/2014   Procedure: ESOPHAGOGASTRODUODENOSCOPY (EGD) WITH PROPOFOL;  Surgeon: Ronald Lobo, MD;  Location: [facility];  Service: Endoscopy;  Laterality: N/A;   EXTERNAL FIXATION LEG Left 04/03/2012   Procedure: EXTERNAL FIXATION LEG;  Surgeon: Wylene Simmer,  MD;  Location: [place];  Service: Orthopedics;  Laterality: Left;   EXTERNAL FIXATION REMOVAL Left 04/14/2012   Procedure: REMOVAL EXTERNAL FIXATION LEG;  Surgeon: Wylene Simmer, MD;  Location: [place];  Service: Orthopedics;  Laterality: Left;   IRRIGATION AND DEBRIDEMENT KNEE Left 04/03/2012   Procedure: IRRIGATION AND DEBRIDEMENT KNEE;  Surgeon: Wylene Simmer, MD;  Location: [place];  Service: Orthopedics;  Laterality: Left;   LEFT HEART CATHETERIZATION WITH CORONARY ANGIOGRAM N/A 04/05/2013   Procedure: LEFT HEART CATHETERIZATION WITH CORONARY ANGIOGRAM;  Surgeon: [name], MD;  Location: [place] [facility];  Service: Cardiovascular;  Laterality: N/A;   MASS EXCISION N/A 07/22/2015   Procedure: EXCISION PENILE MASS;  Surgeon: Hollice Espy, MD;  Location: [facility];  Service: Urology;  Laterality: N/A;   ORIF TIBIA PLATEAU Left 04/14/2012   Procedure: OPEN REDUCTION INTERNAL FIXATION (ORIF) TIBIAL PLATEAU;  Surgeon: Wylene Simmer, MD;  Location: [place];  Service: Orthopedics;  Laterality: Left;   PERCUTANEOUS CORONARY ROTOBLATOR INTERVENTION (PCI-R) N/A 04/06/2013   Procedure: PERCUTANEOUS CORONARY ROTOBLATOR INTERVENTION (PCI-R);  Surgeon: [name], MD;  Location: [facility];  Service: Cardiovascular;  Laterality: N/A;    Prior to Admission medications   Medication Sig Start Date End Date Taking? Authorizing Provider  atorvastatin (LIPITOR) 40 MG tablet Take 1 tablet (40 mg total) by mouth daily. 10/24/18   Theora Gianotti, NP  carvedilol (COREG) 12.5 MG tablet Take 1 tablet (12.5 mg total) by mouth 2 (two) times daily with a meal. 04/19/18   Theora Gianotti, NP  Cinnamon 500 MG capsule Take 1,000 mg by mouth 2 (two) times daily with a meal.    [provider]  clopidogrel (PLAVIX) 75 MG tablet TAKE ONE TABLET BY MOUTH EVERY MORNING WITH BREAKFAST 04/28/18   [name], MD  ferrous sulfate (FERROUSUL) 325 (65 FE) MG tablet Take 1 tablet (325 mg total) by mouth 2 (two) times daily with a meal. 11/01/14   [name], MD  furosemide (LASIX) 20 MG tablet TAKE 2 TABLETS BY MOUTH EVERY MORNING AND TAKE 1 TABLET EVERY EVENING 10/24/18   Minna Merritts, MD  gabapentin (NEURONTIN) 300 MG capsule TAKE 1 CAPSULE 3 TIMES DAILY 10/26/18   [name], MD  glipiZIDE (GLUCOTROL XL) 10 MG 24 hr tablet TAKE ONE TABLET BY MOUTH EVERY DAY WITH BREAKFAST 08/30/18   [name], MD  [name] 100 MG tablet Take 100 mg by mouth daily. 11/18/18   [provider]  meloxicam (MOBIC) 15 MG tablet TAKE 1 TABLET BY MOUTH DAILY  12/19/18   Edrick Kins, DPM  metFORMIN (GLUCOPHAGE-XR) 500 MG 24 hr tablet TAKE TWO TABLETS BY MOUTH TWICE DAILY WITH A MEAL 11/21/18   [name], MD  Multiple Vitamin (MULTIVITAMIN WITH MINERALS) TABS Take 1 tablet by mouth daily.    [provider]  nitroGLYCERIN (NITROSTAT) 0.4 MG SL tablet Place 1 tablet (0.4 mg total) under the tongue every 5 (five) minutes as needed for chest pain. 10/27/16   [name], MD  pantoprazole (PROTONIX) 40 MG tablet TAKE ONE TABLET BY MOUTH TWICE DAILY 04/28/18   [name], MD  quinapril (ACCUPRIL) 20 MG tablet Take 20 mg by mouth daily. 07/29/18   [provider]  sacubitril-valsartan (ENTRESTO) 24-26 MG Take 1 tablet by mouth 2 (two) times daily. 07/25/18   [name], MD  spironolactone (ALDACTONE) 25 MG tablet TAKE 1/2 TABLET EVERY DAY 10/26/18  [name], MD    Family History  Problem Relation Age of Onset   Coronary artery disease Father    Coronary artery disease Brother    Lung cancer Brother    Breast cancer Sister    Prostate cancer Brother    Stroke Sister      Social History   Tobacco Use   Smoking status: Former Smoker    Quit date: 04/05/1995    Years since quitting: 23.7   Smokeless tobacco: Former Systems developer  Substance Use Topics   Alcohol use: No    Comment: Occassional Use   Drug use: No    Allergies as of 12/20/2018   (No Known Allergies)    Review of Systems:    All systems reviewed and negative except where noted in HPI.   Physical Exam:  There were no vitals taken for this visit. No LMP for male patient. General:   Alert,  Well-developed, well-nourished, pleasant and cooperative in NAD Head:  Normocephalic and atraumatic. Eyes:  Sclera clear, no icterus.   Conjunctiva pink. Ears:  Normal auditory acuity. Neck:  Supple; no masses or thyromegaly. Lungs:  Respirations even and unlabored.  Clear throughout to auscultation.   No wheezes, crackles, or  rhonchi. No acute distress. Heart:  Regular rate and rhythm; no murmurs, clicks, rubs, or gallops. Abdomen:  Normal bowel sounds.  No bruits.  Soft, non-tender and non-distended without masses, hepatosplenomegaly or hernias noted.  No guarding or rebound tenderness.  Negative Carnett sign.   Rectal:  Deferred.  Msk:  Symmetrical without gross deformities.  Good, equal movement & strength bilaterally. Pulses:  Normal pulses noted. Extremities:  No clubbing.  Positive lower extremity edema.  No cyanosis. Neurologic:  Alert and oriented x3;  grossly normal neurologically. Skin:  Intact without significant lesions or rashes.  No jaundice. Lymph Nodes:  No significant cervical adenopathy. Psych:  Alert and cooperative. Normal mood and affect.  Imaging Studies: Mr Liver W Wo Contrast  Result Date: 12/16/2018 CLINICAL DATA:  Possible right hepatic mass on recent ultrasound. EXAM: MRI ABDOMEN WITHOUT AND WITH CONTRAST TECHNIQUE: Multiplanar multisequence MR imaging of the abdomen was performed both before and after the administration of intravenous contrast. CONTRAST:  76m GADAVIST GADOBUTROL 1 MMOL/ML IV SOLN COMPARISON:  Ultrasound exam 11/29/2018 FINDINGS: Lower chest: Unremarkable. Hepatobiliary: Nodular hepatic contour is associated with enlargement of the lateral segment left liver, features compatible with cirrhosis. Hypervascular lesion identified in the anterior right liver (segment V) visible on arterial postcontrast series 11, image 40). Lesion measures 1.8 x 1.3 cm on 40/13. Lesion washes out to background liver parenchymal levels, but not to a greater degree than the background liver. There is no enhancing capsule associated with the abnormality. This lesion is nearly isointense to background hepatic parenchyma on precontrast T1 and T2 weighted imaging. It does show mild diffusion restriction. No other suspicious focal abnormality within the liver parenchyma. Tiny stones are seen in the neck of  the gallbladder (19/3). No intrahepatic or extrahepatic biliary dilation. Pancreas: No focal mass lesion. No dilatation of the main duct. No intraparenchymal cyst. No peripancreatic edema. Spleen:  No splenomegaly. No focal mass lesion. Adrenals/Urinary Tract: No adrenal nodule or mass. 10 mm simple cyst noted lower pole right kidney. Left kidney unremarkable. Stomach/Bowel: Stomach is unremarkable. No gastric wall thickening. No evidence of outlet obstruction. Duodenum is normally positioned as is the ligament of Treitz. No small bowel or colonic dilatation within the visualized abdomen. Vascular/Lymphatic: No abdominal aortic aneurysm. Upper  normal hepato duodenal ligament lymph nodes evident, presumably related to the patient's underlying liver disease. No para-aortic lymphadenopathy. Portal vein, superior mesenteric vein, and splenic vein are patent. Prominent venous collateralization noted in the left upper quadrant with paraesophageal varices noted on image 19 of series 13. Other:  No intraperitoneal free fluid. Musculoskeletal: No abnormal marrow enhancement within the visualized bony anatomy. IMPRESSION: 1. Nodular hepatic contour with asymmetric enlargement of the lateral segment left liver suggests cirrhosis. Prominent venous collateralization in the left upper quadrant with subtle paraesophageal varices suggest portal venous hypertension. 2. 1.8 cm hypervascular lesion in the anterior right liver without non peripheral washout or rim enhancing capsule. Imaging features compatible with LI-RADS 3 category lesion. 3. Cholelithiasis Electronically Signed   By: Misty Stanley M.D.   On: 12/16/2018 13:28   US Renal  Result Date: 11/29/2018 CLINICAL DATA:  Chronic renal disease. EXAM: RENAL / URINARY TRACT ULTRASOUND COMPLETE COMPARISON:  Ultrasound 05/02/2015. FINDINGS: Right Kidney: Renal measurements: 9.3 x 5.2 x 5.1 cm = volume: 126.5 mL . Echogenicity within normal limits. No hydronephrosis visualized.  9 mm simple cyst right kidney. Left Kidney: Renal measurements: 11.6 x 5.4 x 5.7 cm = volume: 185.2 mL. Echogenicity within normal limits. No mass or hydronephrosis visualized. Bladder: Appears normal for degree of bladder distention. Right ureteral jet was not visualized. Other: Questionable 3.1 x 2.7 x 2.9 cm isoechoic mass in the right lobe of the liver. To further evaluate mass MRI of the liver suggested. IMPRESSION: 1. No acute renal abnormality identified. No hydronephrosis or bladder distention. Renal echogenicity is normal. 9 mm simple cyst in the right kidney. 2. Questionable 3.1 x 2.7 x 2.9 cm isoechoic mass in the right lobe of liver. To further evaluate MRI of the liver suggested. Electronically Signed   By: Marcello Moores  Register   On: 11/29/2018 08:39    Assessment and Plan:   Devin Hanna is a 68 y.o. y/o male who comes in today with a spot found on his liver that was suggestive of a hepatocellular carcinoma on his MRI.  The patient will be set up for blood test for his liver enzymes and for alpha-fetoprotein.  He has been told that if his alpha-fetoprotein is negative he may be considered for a colonoscopy to make sure that there is not a lesion in the colon that has metastasized to the liver although this is unlikely since the MRI was more consistent with a possible hepatocellular carcinoma.  The patient has been explained the plan and agrees with it.    Lucilla Lame, MD. Marval Regal    Note: This dictation was prepared with Dragon dictation along with smaller phrase technology. Any transcriptional errors that result from this process are unintentional.

## 2018-12-21 LAB — AFP TUMOR MARKER: AFP, Serum, Tumor Marker: 5.1 ng/mL (ref 0.0–8.3)

## 2018-12-23 ENCOUNTER — Telehealth: Payer: Self-pay | Admitting: Family Medicine

## 2018-12-23 NOTE — Telephone Encounter (Signed)
Please let the patient know that I received a letter regarding diabetic shoes from his podiatrist.  They require documentation from [place] to match their findings.  I need to complete a foot exam on him to be able to fill this out.  Please get him scheduled for an in office visit at his convenience.  Thanks.

## 2018-12-26 NOTE — Telephone Encounter (Signed)
Hello Dr. Nicki Reaper, this patient is needing a diabetic foot exam only for his diabetic shoes, I scheduled him with you on Monday 01/02/2019 for this exam I was going to put him with Lauren but the patient needed an appointment next week.  Thanks  Nereyda Bowler,cma

## 2018-12-27 ENCOUNTER — Other Ambulatory Visit: Payer: Self-pay

## 2018-12-27 ENCOUNTER — Telehealth: Payer: Self-pay

## 2018-12-27 DIAGNOSIS — Z1211 Encounter for screening for malignant neoplasm of colon: Secondary | ICD-10-CM

## 2018-12-27 NOTE — Telephone Encounter (Signed)
-----   Message from Lucilla Lame, MD sent at 12/21/2018  7:02 AM EST ----- Let the patient know that the tumor marker was not elevated therefore as we discussed he should undergo colonoscopy to rule out any colon lesions.

## 2018-12-27 NOTE — Telephone Encounter (Signed)
Pt's daughter has been notified of lab results. Pt will be scheduled for a colonoscopy.

## 2018-12-29 NOTE — Progress Notes (Signed)
The patient may stop Plavix 5 days prior to the procedure and restart the day after.

## 2018-12-30 ENCOUNTER — Telehealth: Payer: Self-pay

## 2018-12-30 NOTE — Telephone Encounter (Signed)
-----   Message from [name], MD sent at 12/29/2018 11:26 AM EST -----   ----- Message ----- From: Glennie Isle, CMA Sent: 12/27/2018   2:05 PM EST To: [name], MD

## 2018-12-30 NOTE — Telephone Encounter (Signed)
Left vm on pt's daughter, Kelly's cell per Dr. Fletcher Anon, pt may stop Plavix 5 days prior to procedure and restart 1 day after. Advised her to return my call and let me know she received this message.

## 2019-01-02 ENCOUNTER — Ambulatory Visit (INDEPENDENT_AMBULATORY_CARE_PROVIDER_SITE_OTHER): Payer: PPO | Admitting: Internal Medicine

## 2019-01-02 ENCOUNTER — Other Ambulatory Visit: Payer: Self-pay

## 2019-01-02 DIAGNOSIS — I10 Essential (primary) hypertension: Secondary | ICD-10-CM

## 2019-01-02 DIAGNOSIS — G629 Polyneuropathy, unspecified: Secondary | ICD-10-CM

## 2019-01-02 DIAGNOSIS — I5022 Chronic systolic (congestive) heart failure: Secondary | ICD-10-CM | POA: Diagnosis not present

## 2019-01-02 DIAGNOSIS — E1142 Type 2 diabetes mellitus with diabetic polyneuropathy: Secondary | ICD-10-CM

## 2019-01-02 NOTE — Progress Notes (Addendum)
Patient ID: Devin Hanna, male   DOB: [birth year], 68 y.o.   MRN: [id number]   Subjective:    Patient ID: Devin Hanna, male    DOB: January 30, 1951, 68 y.o.   MRN: [id number]  HPI This visit occurred during the SARS-CoV-2 public health emergency.  Safety protocols were in place, including screening questions prior to the visit, additional usage of staff PPE, and extensive cleaning of exam room while observing appropriate contact time as indicated for disinfecting solutions.  Patient here as a work in for form completion.  He sees podiatry.  Has diabetes and neuropathy.  Foot pain.  Needed evaluation to have form completed for diabetic shoes.  States he is doing relatively well.  Breathing stable.  Handling stress.  Staying in with covid restrictions.     Past Medical History:  Diagnosis Date  . Acute respiratory failure with hypoxia ([place]) 06/2016  . CAD (coronary artery disease)    a. Remote PCI of the RCA;  b. 03/2013 Inf STEMI/PCI: LAD 60-70d, RCA 99p ISR(rota/CBA/DES);  c. 09/2014 MV: inf and infsept infarct, EF 35-40%;  d. 09/2014 Cath: patent RCA stent, stable LAD dzs, EF 40%-->Med rx.  . Diabetes mellitus    sees Dr. Hardin Negus in [place]  . GERD (gastroesophageal reflux disease)   . HFrEF (heart failure with reduced ejection fraction) ([place])   . Hyperlipidemia   . Ischemic cardiomyopathy    a. 09/2014 EF 40% by V gram;  b. 06/2016 Echo: EF 40, diff HK, Gr1 DD, inf HK, no mural thrombus. Mild MR, mildly dil LA, mildly dil RV.   . LBBB (left bundle branch block)    Old  . Morbid obesity ([place])   . Peripheral neuropathy   . Upper GI bleed   . Vertigo    Past Surgical History:  Procedure Laterality Date  . CARDIAC CATHETERIZATION N/A 10/03/2014   Procedure: Left Heart Cath and Coronary Angiography;  Surgeon: [name], MD;  Location: [facility];  Service: Cardiovascular;  Laterality: N/A;  . CIRCUMCISION N/A 07/22/2015   Procedure: CIRCUMCISION ADULT;  Surgeon: Hollice Espy, MD;  Location: [facility];  Service: Urology;  Laterality: N/A;  . CORONARY STENT PLACEMENT    . ESOPHAGOGASTRODUODENOSCOPY (EGD) WITH PROPOFOL N/A 09/12/2014   Procedure: ESOPHAGOGASTRODUODENOSCOPY (EGD) WITH PROPOFOL;  Surgeon: Ronald Lobo, MD;  Location: [facility] ENDOSCOPY;  Service: Endoscopy;  Laterality: N/A;  . EXTERNAL FIXATION LEG Left 04/03/2012   Procedure: EXTERNAL FIXATION LEG;  Surgeon: Wylene Simmer, MD;  Location: [place];  Service: Orthopedics;  Laterality: Left;  . EXTERNAL FIXATION REMOVAL Left 04/14/2012   Procedure: REMOVAL EXTERNAL FIXATION LEG;  Surgeon: Wylene Simmer, MD;  Location: [place];  Service: Orthopedics;  Laterality: Left;  . IRRIGATION AND DEBRIDEMENT KNEE Left 04/03/2012   Procedure: IRRIGATION AND DEBRIDEMENT KNEE;  Surgeon: Wylene Simmer, MD;  Location: [place];  Service: Orthopedics;  Laterality: Left;  . LEFT HEART CATHETERIZATION WITH CORONARY ANGIOGRAM N/A 04/05/2013   Procedure: LEFT HEART CATHETERIZATION WITH CORONARY ANGIOGRAM;  Surgeon: [name], MD;  Location: [facility] LAB;  Service: Cardiovascular;  Laterality: N/A;  . MASS EXCISION N/A 07/22/2015   Procedure: EXCISION PENILE MASS;  Surgeon: Hollice Espy, MD;  Location: [facility];  Service: Urology;  Laterality: N/A;  . ORIF TIBIA PLATEAU Left 04/14/2012   Procedure: OPEN REDUCTION INTERNAL FIXATION (ORIF) TIBIAL PLATEAU;  Surgeon: Wylene Simmer, MD;  Location: [place];  Service: Orthopedics;  Laterality: Left;  . PERCUTANEOUS CORONARY ROTOBLATOR INTERVENTION (PCI-R)  N/A 04/06/2013   Procedure: PERCUTANEOUS CORONARY ROTOBLATOR INTERVENTION (PCI-R);  Surgeon: [name], MD;  Location: [facility] C[facility] LAB;  Service: Cardiovascular;  Laterality: N/A;   Family History  Problem Relation Age of Onset  . Coronary artery disease Father   . Coronary artery disease Brother   . Lung cancer Brother   . Breast cancer Sister   . Prostate cancer Brother   . Stroke Sister    Social History   Socioeconomic History  .  Marital status: Divorced    Spouse name: Not on file  . Number of children: Not on file  . Years of education: Not on file  . Highest education level: Not on file  Occupational History  . Occupation: heavy Company secretary    Employer: [employer]  Social Needs  . Financial resource strain: Not on file  . Food insecurity    Worry: Not on file    Inability: Not on file  . Transportation needs    Medical: Not on file    Non-medical: Not on file  Tobacco Use  . Smoking status: Former Smoker    Quit date: 04/05/1995    Years since quitting: 23.7  . Smokeless tobacco: Former Network engineer and Sexual Activity  . Alcohol use: No    Comment: Occassional Use  . Drug use: No  . Sexual activity: Not on file  Lifestyle  . Physical activity    Days per week: Not on file    Minutes per session: Not on file  . Stress: Not on file  Relationships  . Social Herbalist on phone: Not on file    Gets together: Not on file    Attends religious service: Not on file    Active member of club or organization: Not on file    Attends meetings of clubs or organizations: Not on file    Relationship status: Not on file  Other Topics Concern  . Not on file  Social History Narrative   Heavy Company secretary.       Divorced    Outpatient Encounter Medications as of 01/02/2019  Medication Sig  . atorvastatin (LIPITOR) 40 MG tablet Take 1 tablet (40 mg total) by mouth daily.  . carvedilol (COREG) 12.5 MG tablet Take 1 tablet (12.5 mg total) by mouth 2 (two) times daily with a meal.  . Cinnamon 500 MG capsule Take 1,000 mg by mouth 2 (two) times daily with a meal.  . clopidogrel (PLAVIX) 75 MG tablet TAKE ONE TABLET BY MOUTH EVERY MORNING WITH BREAKFAST  . ferrous sulfate (FERROUSUL) 325 (65 FE) MG tablet Take 1 tablet (325 mg total) by mouth 2 (two) times daily with a meal. (Patient not taking: Reported on 12/20/2018)  . furosemide (LASIX) 20 MG tablet TAKE 2 TABLETS BY MOUTH  EVERY MORNING AND TAKE 1 TABLET EVERY EVENING  . gabapentin (NEURONTIN) 300 MG capsule TAKE 1 CAPSULE 3 TIMES DAILY  . glipiZIDE (GLUCOTROL XL) 10 MG 24 hr tablet TAKE ONE TABLET BY MOUTH EVERY DAY WITH BREAKFAST  . [name] 100 MG tablet Take 100 mg by mouth daily.  . meloxicam (MOBIC) 15 MG tablet TAKE 1 TABLET BY MOUTH DAILY  . metFORMIN (GLUCOPHAGE-XR) 500 MG 24 hr tablet TAKE TWO TABLETS BY MOUTH TWICE DAILY WITH A MEAL  . Multiple Vitamin (MULTIVITAMIN WITH MINERALS) TABS Take 1 tablet by mouth daily.  . nitroGLYCERIN (NITROSTAT) 0.4 MG SL tablet Place 1 tablet (0.4 mg total) under the tongue  every 5 (five) minutes as needed for chest pain.  . pantoprazole (PROTONIX) 40 MG tablet TAKE ONE TABLET BY MOUTH TWICE DAILY  . quinapril (ACCUPRIL) 20 MG tablet Take 20 mg by mouth daily.  . sacubitril-valsartan (ENTRESTO) 24-26 MG Take 1 tablet by mouth 2 (two) times daily.  [place] Kitchen spironolactone (ALDACTONE) 25 MG tablet TAKE 1/2 TABLET EVERY DAY   No facility-administered encounter medications on file as of 01/02/2019.    Review of Systems  Constitutional: Negative for appetite change and unexpected weight change.  HENT: Negative for congestion and sinus pressure.   Respiratory: Negative for cough and chest tightness.        Breathing stable.   Cardiovascular: Negative for chest pain and palpitations.  Gastrointestinal: Negative for abdominal pain, diarrhea, nausea and vomiting.  Musculoskeletal: Negative for joint swelling and myalgias.  Skin: Negative for color change and rash.  Neurological: Negative for dizziness and light-headedness.  Psychiatric/Behavioral: Negative for agitation and dysphoric mood.       Objective:    Physical Exam Constitutional:      General: He is not in acute distress.    Appearance: Normal appearance. He is well-developed.  HENT:     Head: Normocephalic and atraumatic.     Right Ear: External ear normal.     Left Ear: External ear normal.  Cardiovascular:      Rate and Rhythm: Normal rate and regular rhythm.  Pulmonary:     Effort: Pulmonary effort is normal. No respiratory distress.     Breath sounds: Normal breath sounds.  Abdominal:     General: Bowel sounds are normal.     Palpations: Abdomen is soft.     Tenderness: There is no abdominal tenderness.  Musculoskeletal:        General: No swelling or tenderness.     Comments: Feet:  No lesions.  DP pulses palpable and equal bilaterally.  Decreased sensation - distal - toes.  Sensation increases as go more proximal.   Neurological:     Mental Status: He is alert.  Psychiatric:        Behavior: Behavior normal.     BP 132/64   Pulse 77   Temp (!) 97.4 F (36.3 C)   Resp 16   Wt 264 lb (119.7 kg)   SpO2 97%   BMI 42.61 kg/m  Wt Readings from Last 3 Encounters:  01/02/19 264 lb (119.7 kg)  12/20/18 263 lb 12.8 oz (119.7 kg)  12/06/18 260 lb 1.9 oz (118 kg)     Lab Results  Component Value Date   WBC 7.2 12/06/2018   HGB 13.1 12/06/2018   HCT 38.6 (L) 12/06/2018   PLT 171.0 12/06/2018   GLUCOSE 149 (H) 10/26/2018   CHOL 121 03/30/2018   TRIG 185 (H) 03/30/2018   HDL 36 (L) 03/30/2018   LDLCALC 59 03/30/2018   ALT 37 03/30/2018   AST 57 (H) 03/30/2018   NA 138 10/26/2018   K 4.0 10/26/2018   CL 99 10/26/2018   CREATININE 1.60 (H) 12/16/2018   BUN 18 10/26/2018   CO2 26 10/26/2018   TSH 0.889 09/09/2014   INR 1.1 10/01/2014   HGBA1C 6.7 (H) 10/26/2018   MICROALBUR <0.7 10/26/2018    Mr Liver W Wo Contrast  Result Date: 12/16/2018 CLINICAL DATA:  Possible right hepatic mass on recent ultrasound. EXAM: MRI ABDOMEN WITHOUT AND WITH CONTRAST TECHNIQUE: Multiplanar multisequence MR imaging of the abdomen was performed both before and after the administration of intravenous contrast.  CONTRAST:  60m GADAVIST GADOBUTROL 1 MMOL/ML IV SOLN COMPARISON:  Ultrasound exam 11/29/2018 FINDINGS: Lower chest: Unremarkable. Hepatobiliary: Nodular hepatic contour is associated with  enlargement of the lateral segment left liver, features compatible with cirrhosis. Hypervascular lesion identified in the anterior right liver (segment V) visible on arterial postcontrast series 11, image 40). Lesion measures 1.8 x 1.3 cm on 40/13. Lesion washes out to background liver parenchymal levels, but not to a greater degree than the background liver. There is no enhancing capsule associated with the abnormality. This lesion is nearly isointense to background hepatic parenchyma on precontrast T1 and T2 weighted imaging. It does show mild diffusion restriction. No other suspicious focal abnormality within the liver parenchyma. Tiny stones are seen in the neck of the gallbladder (19/3). No intrahepatic or extrahepatic biliary dilation. Pancreas: No focal mass lesion. No dilatation of the main duct. No intraparenchymal cyst. No peripancreatic edema. Spleen:  No splenomegaly. No focal mass lesion. Adrenals/Urinary Tract: No adrenal nodule or mass. 10 mm simple cyst noted lower pole right kidney. Left kidney unremarkable. Stomach/Bowel: Stomach is unremarkable. No gastric wall thickening. No evidence of outlet obstruction. Duodenum is normally positioned as is the ligament of Treitz. No small bowel or colonic dilatation within the visualized abdomen. Vascular/Lymphatic: No abdominal aortic aneurysm. Upper normal hepato duodenal ligament lymph nodes evident, presumably related to the patient's underlying liver disease. No para-aortic lymphadenopathy. Portal vein, superior mesenteric vein, and splenic vein are patent. Prominent venous collateralization noted in the left upper quadrant with paraesophageal varices noted on image 19 of series 13. Other:  No intraperitoneal free fluid. Musculoskeletal: No abnormal marrow enhancement within the visualized bony anatomy. IMPRESSION: 1. Nodular hepatic contour with asymmetric enlargement of the lateral segment left liver suggests cirrhosis. Prominent venous  collateralization in the left upper quadrant with subtle paraesophageal varices suggest portal venous hypertension. 2. 1.8 cm hypervascular lesion in the anterior right liver without non peripheral washout or rim enhancing capsule. Imaging features compatible with LI-RADS 3 category lesion. 3. Cholelithiasis Electronically Signed   By: EMisty StanleyM.D.   On: 12/16/2018 13:28       Assessment & Plan:   Problem List Items Addressed This Visit    Chronic systolic heart failure ([facility]    Stable.  Breathing stable.  Doing well on current medication regimen.       DM type 2 (diabetes mellitus, type 2) (HCC)    Reports sugars stable.        HTN (hypertension)    Blood pressure ok - on current regimen.       Neuropathy    History of diabetes with associated neuropathy.  Increased pain.  Saw podiatry.  Feel would benefit from diabetic shoes.  Form completed.  Follow.            CEinar Pheasant MD

## 2019-01-05 ENCOUNTER — Encounter: Payer: Self-pay | Admitting: Internal Medicine

## 2019-01-05 NOTE — Assessment & Plan Note (Signed)
History of diabetes with associated neuropathy.  Increased pain.  Saw podiatry.  Feel would benefit from diabetic shoes.  Form completed.  Follow.

## 2019-01-05 NOTE — Assessment & Plan Note (Signed)
Stable.  Breathing stable.  Doing well on current medication regimen.

## 2019-01-05 NOTE — Assessment & Plan Note (Signed)
Reports sugars stable.

## 2019-01-05 NOTE — Assessment & Plan Note (Signed)
Blood pressure ok - on current regimen.

## 2019-01-09 ENCOUNTER — Other Ambulatory Visit
Admission: RE | Admit: 2019-01-09 | Discharge: 2019-01-09 | Disposition: A | Discharge status: Home / Self Care | Payer: PPO | Source: Ambulatory Visit | Attending: Gastroenterology | Admitting: Gastroenterology

## 2019-01-09 DIAGNOSIS — Z20828 Contact with and (suspected) exposure to other viral communicable diseases: Secondary | ICD-10-CM | POA: Diagnosis not present

## 2019-01-09 DIAGNOSIS — Z01812 Encounter for preprocedural laboratory examination: Secondary | ICD-10-CM | POA: Insufficient documentation

## 2019-01-10 LAB — SARS CORONAVIRUS 2 (TAT 6-24 HRS): SARS Coronavirus 2: NEGATIVE

## 2019-01-12 ENCOUNTER — Ambulatory Visit: Payer: PPO | Admitting: Anesthesiology

## 2019-01-12 ENCOUNTER — Ambulatory Visit
Admission: RE | Admit: 2019-01-12 | Discharge: 2019-01-12 | Disposition: A | Discharge status: Home / Self Care | Payer: PPO | Attending: Gastroenterology | Admitting: Gastroenterology

## 2019-01-12 ENCOUNTER — Other Ambulatory Visit: Payer: Self-pay

## 2019-01-12 ENCOUNTER — Encounter: Payer: Self-pay | Admitting: *Deleted

## 2019-01-12 ENCOUNTER — Encounter
Admission: RE | Disposition: A | Discharge status: Home / Self Care | Payer: Self-pay | Source: Home / Self Care | Attending: Gastroenterology

## 2019-01-12 DIAGNOSIS — I130 Hypertensive heart and chronic kidney disease with heart failure and stage 1 through stage 4 chronic kidney disease, or unspecified chronic kidney disease: Secondary | ICD-10-CM | POA: Diagnosis not present

## 2019-01-12 DIAGNOSIS — Z7902 Long term (current) use of antithrombotics/antiplatelets: Secondary | ICD-10-CM | POA: Diagnosis not present

## 2019-01-12 DIAGNOSIS — I110 Hypertensive heart disease with heart failure: Secondary | ICD-10-CM | POA: Diagnosis not present

## 2019-01-12 DIAGNOSIS — I252 Old myocardial infarction: Secondary | ICD-10-CM | POA: Insufficient documentation

## 2019-01-12 DIAGNOSIS — D123 Benign neoplasm of transverse colon: Secondary | ICD-10-CM | POA: Diagnosis not present

## 2019-01-12 DIAGNOSIS — Z1211 Encounter for screening for malignant neoplasm of colon: Secondary | ICD-10-CM | POA: Diagnosis not present

## 2019-01-12 DIAGNOSIS — K219 Gastro-esophageal reflux disease without esophagitis: Secondary | ICD-10-CM | POA: Insufficient documentation

## 2019-01-12 DIAGNOSIS — I255 Ischemic cardiomyopathy: Secondary | ICD-10-CM | POA: Diagnosis not present

## 2019-01-12 DIAGNOSIS — Z79899 Other long term (current) drug therapy: Secondary | ICD-10-CM | POA: Insufficient documentation

## 2019-01-12 DIAGNOSIS — Z6841 Body Mass Index (BMI) 40.0 and over, adult: Secondary | ICD-10-CM | POA: Diagnosis not present

## 2019-01-12 DIAGNOSIS — K7460 Unspecified cirrhosis of liver: Secondary | ICD-10-CM | POA: Diagnosis not present

## 2019-01-12 DIAGNOSIS — Z7984 Long term (current) use of oral hypoglycemic drugs: Secondary | ICD-10-CM | POA: Insufficient documentation

## 2019-01-12 DIAGNOSIS — R933 Abnormal findings on diagnostic imaging of other parts of digestive tract: Secondary | ICD-10-CM | POA: Diagnosis not present

## 2019-01-12 DIAGNOSIS — K621 Rectal polyp: Secondary | ICD-10-CM | POA: Insufficient documentation

## 2019-01-12 DIAGNOSIS — G4730 Sleep apnea, unspecified: Secondary | ICD-10-CM | POA: Insufficient documentation

## 2019-01-12 DIAGNOSIS — E1122 Type 2 diabetes mellitus with diabetic chronic kidney disease: Secondary | ICD-10-CM | POA: Insufficient documentation

## 2019-01-12 DIAGNOSIS — Z791 Long term (current) use of non-steroidal anti-inflammatories (NSAID): Secondary | ICD-10-CM | POA: Insufficient documentation

## 2019-01-12 DIAGNOSIS — I5022 Chronic systolic (congestive) heart failure: Secondary | ICD-10-CM | POA: Diagnosis not present

## 2019-01-12 DIAGNOSIS — K635 Polyp of colon: Secondary | ICD-10-CM

## 2019-01-12 DIAGNOSIS — Z87891 Personal history of nicotine dependence: Secondary | ICD-10-CM | POA: Insufficient documentation

## 2019-01-12 DIAGNOSIS — N183 Chronic kidney disease, stage 3 unspecified: Secondary | ICD-10-CM | POA: Diagnosis not present

## 2019-01-12 DIAGNOSIS — I2510 Atherosclerotic heart disease of native coronary artery without angina pectoris: Secondary | ICD-10-CM | POA: Insufficient documentation

## 2019-01-12 DIAGNOSIS — I868 Varicose veins of other specified sites: Secondary | ICD-10-CM | POA: Diagnosis not present

## 2019-01-12 DIAGNOSIS — Z955 Presence of coronary angioplasty implant and graft: Secondary | ICD-10-CM | POA: Diagnosis not present

## 2019-01-12 DIAGNOSIS — E785 Hyperlipidemia, unspecified: Secondary | ICD-10-CM | POA: Diagnosis not present

## 2019-01-12 DIAGNOSIS — Z7689 Persons encountering health services in other specified circumstances: Secondary | ICD-10-CM | POA: Diagnosis not present

## 2019-01-12 HISTORY — PX: COLONOSCOPY WITH PROPOFOL: SHX5780

## 2019-01-12 LAB — GLUCOSE, CAPILLARY: Glucose-Capillary: 114 mg/dL — ABNORMAL HIGH (ref 70–99)

## 2019-01-12 SURGERY — COLONOSCOPY WITH PROPOFOL
Anesthesia: General

## 2019-01-12 MED ORDER — PROPOFOL 500 MG/50ML IV EMUL
INTRAVENOUS | Status: DC | PRN
  Administered 2019-01-12: 120 ug/kg/min via INTRAVENOUS

## 2019-01-12 MED ORDER — PHENYLEPHRINE HCL (PRESSORS) 10 MG/ML IV SOLN
INTRAVENOUS | Status: DC | PRN
  Administered 2019-01-12: 100 ug via INTRAVENOUS

## 2019-01-12 MED ORDER — SODIUM CHLORIDE 0.9 % IV SOLN
INTRAVENOUS | Status: DC
  Administered 2019-01-12: 12:00:00 via INTRAVENOUS

## 2019-01-12 MED ORDER — LIDOCAINE HCL (PF) 2 % IJ SOLN
INTRAMUSCULAR | Status: AC
  Filled 2019-01-12: qty 10

## 2019-01-12 MED ORDER — PROPOFOL 10 MG/ML IV BOLUS
INTRAVENOUS | Status: AC
  Filled 2019-01-12: qty 20

## 2019-01-12 MED ORDER — PROPOFOL 10 MG/ML IV BOLUS
INTRAVENOUS | Status: DC | PRN
  Administered 2019-01-12: 30 mg via INTRAVENOUS
  Administered 2019-01-12: 40 mg via INTRAVENOUS

## 2019-01-12 MED ORDER — LIDOCAINE HCL (CARDIAC) PF 100 MG/5ML IV SOSY
PREFILLED_SYRINGE | INTRAVENOUS | Status: DC | PRN
  Administered 2019-01-12: 60 mg via INTRAVENOUS

## 2019-01-12 NOTE — Anesthesia Preprocedure Evaluation (Addendum)
Anesthesia Evaluation  Patient identified by MRN, date of birth, ID band Patient awake    Reviewed: Allergy & Precautions, NPO status , Patient's Chart, lab work & pertinent test results  History of Anesthesia Complications Negative for: history of anesthetic complications  Airway Mallampati: III       Dental   Pulmonary sleep apnea (not using CPAP) , neg COPD, Not current smoker, former smoker,           Cardiovascular hypertension, + CAD, + Past MI, + Cardiac Stents and +CHF  (-) dysrhythmias (-) Valvular Problems/Murmurs     Neuro/Psych neg Seizures    GI/Hepatic Neg liver ROS, PUD, GERD  Medicated and Controlled,  Endo/Other  diabetes, Type 2, Oral Hypoglycemic Agents  Renal/GU Renal InsufficiencyRenal disease     Musculoskeletal   Abdominal   Peds  Hematology   Anesthesia Other Findings   Reproductive/Obstetrics                            Anesthesia Physical Anesthesia Plan  ASA: III  Anesthesia Plan: General   Post-op Pain Management:    Induction: Intravenous  PONV Risk Score and Plan: 2 and TIVA and Propofol infusion  Airway Management Planned: Nasal Cannula  Additional Equipment:   Intra-op Plan:   Post-operative Plan:   Informed Consent: I have reviewed the patients History and Physical, chart, labs and discussed the procedure including the risks, benefits and alternatives for the proposed anesthesia with the patient or authorized representative who has indicated his/her understanding and acceptance.       Plan Discussed with:   Anesthesia Plan Comments:         Anesthesia Quick Evaluation

## 2019-01-12 NOTE — Interval H&P Note (Signed)
History and Physical Interval Note:  01/12/2019 1:02 PM  Devin Hanna [place]  has presented today for surgery, with the diagnosis of Screening Z12.11.  The various methods of treatment have been discussed with the patient and family. After consideration of risks, benefits and other options for treatment, the patient has consented to  Procedure(s): COLONOSCOPY WITH PROPOFOL (N/A) as a surgical intervention.  The patient's history has been reviewed, patient examined, no change in status, stable for surgery.  I have reviewed the patient's chart and labs.  Questions were answered to the patient's satisfaction.     Taksh Hjort [employer]

## 2019-01-12 NOTE — Anesthesia Postprocedure Evaluation (Signed)
Anesthesia Post Note  Patient: Devin Hanna  Procedure(s) Performed: COLONOSCOPY WITH PROPOFOL (N/A )  Patient location during evaluation: Endoscopy Anesthesia Type: General Level of consciousness: awake and alert Pain management: pain level controlled Vital Signs Assessment: post-procedure vital signs reviewed and stable Respiratory status: spontaneous breathing and respiratory function stable Cardiovascular status: stable Anesthetic complications: no     Last Vitals:  Vitals:   01/12/19 1157 01/12/19 1302  BP: (!) 146/76 95/60  Pulse: (!) 59   Resp: 18   Temp: (!) 36.3 C (!) 36.2 C  SpO2: 100%     Last Pain:  Vitals:   01/12/19 1302  TempSrc: Temporal  PainSc: Asleep                 Cardelia Sassano K

## 2019-01-12 NOTE — Transfer of Care (Signed)
Immediate Anesthesia Transfer of Care Note  Patient: Devin Hanna  Procedure(s) Performed: COLONOSCOPY WITH PROPOFOL (N/A )  Patient Location: PACU  Anesthesia Type:General  Level of Consciousness: sedated  Airway & Oxygen Therapy: Patient Spontanous Breathing and Patient connected to nasal cannula oxygen  Post-op Assessment: Report given to RN and Post -op Vital signs reviewed and stable  Post vital signs: Reviewed and stable  Last Vitals:  Vitals Value Taken Time  BP 95/60 01/12/19 1302  Temp 36.2 C 01/12/19 1302  Pulse 51 01/12/19 1304  Resp 14 01/12/19 1304  SpO2 100 % 01/12/19 1304  Vitals shown include unvalidated device data.  Last Pain:  Vitals:   01/12/19 1302  TempSrc: Temporal  PainSc: Asleep         Complications: No apparent anesthesia complications

## 2019-01-12 NOTE — Anesthesia Post-op Follow-up Note (Signed)
Anesthesia QCDR form completed.        

## 2019-01-12 NOTE — Op Note (Signed)
[facility] Patient Name: Devin Hanna Procedure Date: 01/12/2019 10:52 AM MRN: [id number] Account #: [id number] Date of Birth: [birth year] Admit Type: Outpatient Age: 68 Room: [facility] ENDO ROOM 4 Gender: Male Note Status: Finalized Procedure:             Colonoscopy Indications:           Abnormal MRI of the GI tract Providers:             Lucilla Lame MD, MD Referring MD:          Angela Adam. [name] (Referring MD) Medicines:             Propofol per Anesthesia Complications:         No immediate complications. Procedure:             Pre-Anesthesia Assessment:                        - Prior to the procedure, a History and Physical was                         performed, and patient medications and allergies were                         reviewed. The patient's tolerance of previous                         anesthesia was also reviewed. The risks and benefits                         of the procedure and the sedation options and risks                         were discussed with the patient. All questions were                         answered, and informed consent was obtained. Prior                         Anticoagulants: The patient has taken no previous                         anticoagulant or antiplatelet agents. ASA Grade                         Assessment: II - A patient with mild systemic disease.                         After reviewing the risks and benefits, the patient                         was deemed in satisfactory condition to undergo the                         procedure.                        After obtaining informed consent, the colonoscope was  passed under direct vision. Throughout the procedure,                         the patient's blood pressure, pulse, and oxygen                         saturations were monitored continuously. The                         Colonoscope was introduced through the anus and                   advanced to the the cecum, identified by the ileocecal                         valve. The colonoscopy was performed without                         difficulty. The patient tolerated the procedure well.                         The quality of the bowel preparation was good. Findings:      The perianal and digital rectal examinations were normal.      A 4 mm polyp was found in the transverse colon. The polyp was sessile.       The polyp was removed with a cold snare. Resection and retrieval were       complete.      A 2 mm polyp was found in the sigmoid colon. The polyp was sessile. The       polyp was removed with a cold snare. Resection was complete, but the       polyp tissue was not retrieved.      A 5 mm polyp was found in the rectum. The polyp was sessile. The polyp       was removed with a cold snare. Resection and retrieval were complete.      Non-bleeding rectal varices were found. Impression:            - One 4 mm polyp in the transverse colon, removed with                         a cold snare. Resected and retrieved.                        - One 2 mm polyp in the sigmoid colon, removed with a                         cold snare. Complete resection. Polyp tissue not                         retrieved.                        - One 5 mm polyp in the rectum, removed with a cold                         snare. Resected and retrieved.                        -  Rectal varices. Recommendation:        - Discharge patient to home.                        - Resume previous diet.                        - Continue present medications.                        - Await pathology results.                        - Repeat colonoscopy in 5 years if polyp adenoma and                         10 years if hyperplastic                        - Refer to an oncologist.                        - Refer to an interventional radiologist. Procedure Code(s):     --- Professional ---                         [phone], Colonoscopy, flexible; with removal of                         tumor(s), polyp(s), or other lesion(s) by snare                         technique Diagnosis Code(s):     --- Professional ---                        R93.3, Abnormal findings on diagnostic imaging of                         other parts of digestive tract                        K62.1, Rectal polyp                        K63.5, Polyp of colon CPT copyright 2019 American Medical Association. All rights reserved. The codes documented in this report are preliminary and upon coder review may  be revised to meet current compliance requirements. Lucilla Lame MD, MD 01/12/2019 1:02:06 PM This report has been signed electronically. Number of Addenda: 0 Note Initiated On: 01/12/2019 10:52 AM Scope Withdrawal Time: 0 hours 11 minutes 36 seconds  Total Procedure Duration: 0 hours 13 minutes 14 seconds  Estimated Blood Loss:  Estimated blood loss: none.      [facility]

## 2019-01-13 ENCOUNTER — Encounter: Payer: Self-pay | Admitting: Gastroenterology

## 2019-01-13 LAB — SURGICAL PATHOLOGY

## 2019-01-18 ENCOUNTER — Other Ambulatory Visit: Payer: Self-pay | Admitting: Cardiovascular Disease

## 2019-01-18 ENCOUNTER — Telehealth: Payer: Self-pay

## 2019-01-18 ENCOUNTER — Telehealth: Payer: Self-pay | Admitting: *Deleted

## 2019-01-18 DIAGNOSIS — R160 Hepatomegaly, not elsewhere classified: Secondary | ICD-10-CM

## 2019-01-18 MED ORDER — ATORVASTATIN CALCIUM 40 MG PO TABS: 40.0000 mg | ORAL_TABLET | Freq: Every day | ORAL | 0 refills | Status: DC

## 2019-01-18 NOTE — Telephone Encounter (Signed)
See Result Note from 01/17/19. Reviewed Dr. [name] note regarding the GI's recommendations with his daughter, [name]. She would like the referrals placed for hematology and interventional radiology. Routing to PCP for referrals.

## 2019-01-18 NOTE — Telephone Encounter (Signed)
Requested Prescriptions   Signed Prescriptions Disp Refills  . atorvastatin (LIPITOR) 40 MG tablet 90 tablet 0    Sig: Take 1 tablet (40 mg total) by mouth daily.    Authorizing Provider: Theora Gianotti    Ordering User: Raelene Bott, BRANDY L

## 2019-01-18 NOTE — Telephone Encounter (Signed)
Referrals placed. I will forward to [facility] to send referrals.

## 2019-01-25 ENCOUNTER — Telehealth: Payer: Self-pay | Admitting: Gastroenterology

## 2019-01-25 DIAGNOSIS — R160 Hepatomegaly, not elsewhere classified: Secondary | ICD-10-CM

## 2019-01-25 NOTE — Telephone Encounter (Signed)
PATIENT'S DAUGHTER KELLY TUGGLE CALLED STATING SHE WAS RETURNING A CALL ABOUT HER FATHER.

## 2019-01-25 NOTE — Telephone Encounter (Signed)
Pt's daughter notified of procedure results. Pt has an appt with hematology tomorrow, 01/26/19. Referral to interventional radiology has been done.

## 2019-01-26 ENCOUNTER — Inpatient Hospital Stay: Payer: PPO | Attending: Internal Medicine | Admitting: Internal Medicine

## 2019-01-26 ENCOUNTER — Encounter: Payer: Self-pay | Admitting: Internal Medicine

## 2019-01-26 ENCOUNTER — Encounter (INDEPENDENT_AMBULATORY_CARE_PROVIDER_SITE_OTHER): Payer: Self-pay

## 2019-01-26 ENCOUNTER — Telehealth: Payer: Self-pay

## 2019-01-26 ENCOUNTER — Other Ambulatory Visit: Payer: Self-pay

## 2019-01-26 ENCOUNTER — Inpatient Hospital Stay: Payer: PPO

## 2019-01-26 VITALS — BP 133/65 | HR 64 | Temp 99.0°F | Resp 20 | Wt 261.8 lb

## 2019-01-26 DIAGNOSIS — N183 Chronic kidney disease, stage 3 unspecified: Secondary | ICD-10-CM | POA: Diagnosis not present

## 2019-01-26 DIAGNOSIS — Z7902 Long term (current) use of antithrombotics/antiplatelets: Secondary | ICD-10-CM | POA: Diagnosis not present

## 2019-01-26 DIAGNOSIS — Z801 Family history of malignant neoplasm of trachea, bronchus and lung: Secondary | ICD-10-CM | POA: Insufficient documentation

## 2019-01-26 DIAGNOSIS — Z791 Long term (current) use of non-steroidal anti-inflammatories (NSAID): Secondary | ICD-10-CM | POA: Insufficient documentation

## 2019-01-26 DIAGNOSIS — K769 Liver disease, unspecified: Secondary | ICD-10-CM

## 2019-01-26 DIAGNOSIS — Z803 Family history of malignant neoplasm of breast: Secondary | ICD-10-CM | POA: Insufficient documentation

## 2019-01-26 DIAGNOSIS — Z79899 Other long term (current) drug therapy: Secondary | ICD-10-CM | POA: Insufficient documentation

## 2019-01-26 DIAGNOSIS — E1122 Type 2 diabetes mellitus with diabetic chronic kidney disease: Secondary | ICD-10-CM | POA: Diagnosis not present

## 2019-01-26 DIAGNOSIS — I255 Ischemic cardiomyopathy: Secondary | ICD-10-CM | POA: Insufficient documentation

## 2019-01-26 DIAGNOSIS — Z8549 Personal history of malignant neoplasm of other male genital organs: Secondary | ICD-10-CM | POA: Insufficient documentation

## 2019-01-26 DIAGNOSIS — E785 Hyperlipidemia, unspecified: Secondary | ICD-10-CM | POA: Diagnosis not present

## 2019-01-26 DIAGNOSIS — I2510 Atherosclerotic heart disease of native coronary artery without angina pectoris: Secondary | ICD-10-CM | POA: Insufficient documentation

## 2019-01-26 DIAGNOSIS — K219 Gastro-esophageal reflux disease without esophagitis: Secondary | ICD-10-CM | POA: Diagnosis not present

## 2019-01-26 DIAGNOSIS — Z87891 Personal history of nicotine dependence: Secondary | ICD-10-CM | POA: Insufficient documentation

## 2019-01-26 DIAGNOSIS — Z8042 Family history of malignant neoplasm of prostate: Secondary | ICD-10-CM | POA: Insufficient documentation

## 2019-01-26 DIAGNOSIS — Z7984 Long term (current) use of oral hypoglycemic drugs: Secondary | ICD-10-CM | POA: Insufficient documentation

## 2019-01-26 DIAGNOSIS — K7460 Unspecified cirrhosis of liver: Secondary | ICD-10-CM | POA: Diagnosis not present

## 2019-01-26 LAB — COMPREHENSIVE METABOLIC PANEL
ALT: 41 U/L (ref 0–44)
AST: 49 U/L — ABNORMAL HIGH (ref 15–41)
Albumin: 3.6 g/dL (ref 3.5–5.0)
Alkaline Phosphatase: 87 U/L (ref 38–126)
Anion gap: 9 (ref 5–15)
BUN: 17 mg/dL (ref 8–23)
CO2: 27 mmol/L (ref 22–32)
Calcium: 9 mg/dL (ref 8.9–10.3)
Chloride: 104 mmol/L (ref 98–111)
Creatinine, Ser: 1.6 mg/dL — ABNORMAL HIGH (ref 0.61–1.24)
GFR calc Af Amer: 50 mL/min — ABNORMAL LOW (ref >60)
GFR calc non Af Amer: 43 mL/min — ABNORMAL LOW (ref >60)
Glucose, Bld: 120 mg/dL — ABNORMAL HIGH (ref 70–99)
Potassium: 4.2 mmol/L (ref 3.5–5.1)
Sodium: 140 mmol/L (ref 135–145)
Total Bilirubin: 0.9 mg/dL (ref 0.3–1.2)
Total Protein: 6.7 g/dL (ref 6.5–8.1)

## 2019-01-26 LAB — APTT: aPTT: 31 seconds (ref 24–36)

## 2019-01-26 LAB — CBC WITH DIFFERENTIAL/PLATELET
Abs Immature Granulocytes: 0.01 10*3/uL (ref 0.00–0.07)
Basophils Absolute: 0 10*3/uL (ref 0.0–0.1)
Basophils Relative: 1 %
Eosinophils Absolute: 0.2 10*3/uL (ref 0.0–0.5)
Eosinophils Relative: 3 %
HCT: 39.3 % (ref 39.0–52.0)
Hemoglobin: 13.1 g/dL (ref 13.0–17.0)
Immature Granulocytes: 0 %
Lymphocytes Relative: 22 %
Lymphs Abs: 1.5 10*3/uL (ref 0.7–4.0)
MCH: 32 pg (ref 26.0–34.0)
MCHC: 33.3 g/dL (ref 30.0–36.0)
MCV: 96.1 fL (ref 80.0–100.0)
Monocytes Absolute: 0.6 10*3/uL (ref 0.1–1.0)
Monocytes Relative: 9 %
Neutro Abs: 4.6 10*3/uL (ref 1.7–7.7)
Neutrophils Relative %: 65 %
Platelets: 171 10*3/uL (ref 150–400)
RBC: 4.09 MIL/uL — ABNORMAL LOW (ref 4.22–5.81)
RDW: 13.2 % (ref 11.5–15.5)
WBC: 7 10*3/uL (ref 4.0–10.5)
nRBC: 0 % (ref 0.0–0.2)

## 2019-01-26 LAB — PROTIME-INR
INR: 1.1 (ref 0.8–1.2)
Prothrombin Time: 13.8 seconds (ref 11.4–15.2)

## 2019-01-26 NOTE — Progress Notes (Signed)
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 Cause ? NASH; GI w/u- [place] 2017]; CKD- III [40s]  # colonoscopy-dec 2020 polyps [Dr.Wohl]; s/p resection; EGD - bleeding ulcer- ? Asprin [2016; Dr.Buccini; [place]]   Penile cancer ([place])  06/28/2015 Initial Diagnosis   Penile cancer (H[facility])      HISTORY OF PRESENTING ILLNESS:  Devin Hanna 68 y.o.  male with a history of cirrhosis; stage I penile cancer; also history of chronic kidney disease is here for follow-up to discuss further work-up regarding liver lesion.    Patient undergoing work-up for chronic kidney disease-stage III with a kidney ultrasound showed incidental liver lesion.  This was further worked up by MRI in November-that showed 1.8 cm lesion in the right lobe of the liver.   Patient denies any symptoms.  Denies any weight loss.  Any nausea vomiting.   Review of Systems  Constitutional: Negative for chills, diaphoresis, fever, malaise/fatigue and weight loss.  HENT: Negative for nosebleeds and sore throat.   Eyes: Negative for double vision.  Respiratory: Negative for cough, hemoptysis, sputum production, shortness of breath and wheezing.   Cardiovascular: Negative for chest pain, palpitations, orthopnea and leg swelling.  Gastrointestinal: Negative for abdominal pain, blood in stool, constipation, diarrhea, heartburn, melena, nausea and vomiting.  Genitourinary: Negative for dysuria, frequency and urgency.  Musculoskeletal: Negative for back pain and joint pain.  Skin: Negative.  Negative for  itching and rash.  Neurological: Negative for dizziness, tingling, focal weakness, weakness and headaches.  Endo/Heme/Allergies: Does not bruise/bleed easily.  Psychiatric/Behavioral: Negative for depression. The patient is not nervous/anxious and does not have insomnia.      MEDICAL HISTORY:  Past Medical History:  Diagnosis Date  . Acute respiratory failure with hypoxia ([place]) 06/2016  . CAD (coronary artery disease)    a. Remote PCI of the RCA;  b. 03/2013 Inf STEMI/PCI: LAD 60-70d, RCA 99p ISR(rota/CBA/DES);  c. 09/2014 MV: inf and infsept infarct, EF 35-40%;  d. 09/2014 Cath: patent RCA stent, stable LAD dzs, EF 40%-->Med rx.  . Diabetes mellitus    sees Dr. Hardin Negus in [place]  . GERD (gastroesophageal reflux disease)   . HFrEF (heart failure with reduced ejection fraction) ([place])   . Hyperlipidemia   . Ischemic cardiomyopathy    a. 09/2014 EF 40% by V gram;  b. 06/2016 Echo: EF 40, diff HK, Gr1 DD, inf HK, no mural thrombus. Mild MR, mildly dil LA, mildly dil RV.   . LBBB (left bundle branch block)    Old  . Morbid obesity ([place])   . Peripheral neuropathy   . Upper GI bleed   . Vertigo     SURGICAL HISTORY: Past Surgical History:  Procedure Laterality Date  . CARDIAC CATHETERIZATION N/A 10/03/2014   Procedure: Left Heart Cath and Coronary Angiography;  Surgeon: [name], MD;  Location: [facility];  Service: Cardiovascular;  Laterality: N/A;  . CIRCUMCISION N/A 07/22/2015   Procedure: CIRCUMCISION ADULT;  Surgeon: Hollice Espy, MD;  Location: [facility] ORS;  Service: Urology;  Laterality: N/A;  .  COLONOSCOPY WITH PROPOFOL N/A 01/12/2019   Procedure: COLONOSCOPY WITH PROPOFOL;  Surgeon: Lucilla Lame, MD;  Location: [facility] ENDOSCOPY;  Service: Endoscopy;  Laterality: N/A;  . CORONARY STENT PLACEMENT    . ESOPHAGOGASTRODUODENOSCOPY (EGD) WITH PROPOFOL N/A 09/12/2014   Procedure: ESOPHAGOGASTRODUODENOSCOPY (EGD) WITH PROPOFOL;  Surgeon: Ronald Lobo, MD;  Location: [facility]  ENDOSCOPY;  Service: Endoscopy;  Laterality: N/A;  . EXTERNAL FIXATION LEG Left 04/03/2012   Procedure: EXTERNAL FIXATION LEG;  Surgeon: Wylene Simmer, MD;  Location: [place];  Service: Orthopedics;  Laterality: Left;  . EXTERNAL FIXATION REMOVAL Left 04/14/2012   Procedure: REMOVAL EXTERNAL FIXATION LEG;  Surgeon: Wylene Simmer, MD;  Location: [place];  Service: Orthopedics;  Laterality: Left;  . IRRIGATION AND DEBRIDEMENT KNEE Left 04/03/2012   Procedure: IRRIGATION AND DEBRIDEMENT KNEE;  Surgeon: Wylene Simmer, MD;  Location: [place];  Service: Orthopedics;  Laterality: Left;  . LEFT HEART CATHETERIZATION WITH CORONARY ANGIOGRAM N/A 04/05/2013   Procedure: LEFT HEART CATHETERIZATION WITH CORONARY ANGIOGRAM;  Surgeon: [name], MD;  Location: [place] CATH LAB;  Service: Cardiovascular;  Laterality: N/A;  . MASS EXCISION N/A 07/22/2015   Procedure: EXCISION PENILE MASS;  Surgeon: Hollice Espy, MD;  Location: [facility] ORS;  Service: Urology;  Laterality: N/A;  . ORIF TIBIA PLATEAU Left 04/14/2012   Procedure: OPEN REDUCTION INTERNAL FIXATION (ORIF) TIBIAL PLATEAU;  Surgeon: Wylene Simmer, MD;  Location: [place];  Service: Orthopedics;  Laterality: Left;  . PERCUTANEOUS CORONARY ROTOBLATOR INTERVENTION (PCI-R) N/A 04/06/2013   Procedure: PERCUTANEOUS CORONARY ROTOBLATOR INTERVENTION (PCI-R);  Surgeon: [name], MD;  Location: [facility] CATH LAB;  Service: Cardiovascular;  Laterality: N/A;    SOCIAL HISTORY: Social History   Socioeconomic History  . Marital status: Divorced    Spouse name: Not on file  . Number of children: Not on file  . Years of education: Not on file  . Highest education level: Not on file  Occupational History  . Occupation: heavy Company secretary    Employer: [employer]  Tobacco Use  . Smoking status: Former Smoker    Packs/day: 3.00    Years: 40.00    Pack years: 120.00    Types: Cigarettes    Quit date: 04/05/1995    Years since quitting: 23.8  . Smokeless tobacco: Former  Systems developer  . Tobacco comment: started smoking at age 49  Substance and Sexual Activity  . Alcohol use: No    Comment: Occassional Use  . Drug use: No  . Sexual activity: Not on file  Other Topics Concern  . Not on file  Social History Narrative   Heavy Company secretary.; Divorced; quit smoking 25 years; beer rare; by self. Still riding motor cycles.    Social Determinants of Health   Financial Resource Strain:   . Difficulty of Paying Living Expenses: Not on file  Food Insecurity:   . Worried About Charity fundraiser in the Last Year: Not on file  . Ran Out of Food in the Last Year: Not on file  Transportation Needs:   . Lack of Transportation (Medical): Not on file  . Lack of Transportation (Non-Medical): Not on file  Physical Activity:   . Days of Exercise per Week: Not on file  . Minutes of Exercise per Session: Not on file  Stress:   . Feeling of Stress : Not on file  Social Connections:   . Frequency of Communication with Friends and Family: Not on file  . Frequency of Social Gatherings with Friends and Family:  Not on file  . Attends Religious Services: Not on file  . Active Member of Clubs or Organizations: Not on file  . Attends Archivist Meetings: Not on file  . Marital Status: Not on file  Intimate Partner Violence:   . Fear of Current or Ex-Partner: Not on file  . Emotionally Abused: Not on file  . Physically Abused: Not on file  . Sexually Abused: Not on file    FAMILY HISTORY: Family History  Problem Relation Age of Onset  . Coronary artery disease Father   . Coronary artery disease Brother   . Lung cancer Brother   . Breast cancer Sister   . Prostate cancer Brother   . Stroke Sister     ALLERGIES:  has No Known Allergies.  MEDICATIONS:  Current Outpatient Medications  Medication Sig Dispense Refill  . atorvastatin (LIPITOR) 40 MG tablet Take 1 tablet (40 mg total) by mouth daily. 90 tablet 0  . carvedilol (COREG) 12.5 MG tablet Take 1  tablet (12.5 mg total) by mouth 2 (two) times daily with a meal. 180 tablet 3  . Cinnamon 500 MG capsule Take 1,000 mg by mouth 2 (two) times daily with a meal.    . clopidogrel (PLAVIX) 75 MG tablet TAKE 1 TABLET BY MOUTH EVERY MORNING WITH BREAKFAST 90 tablet 0  . furosemide (LASIX) 20 MG tablet TAKE 2 TABLETS BY MOUTH EVERY MORNING AND TAKE 1 TABLET EVERY EVENING 270 tablet 3  . gabapentin (NEURONTIN) 300 MG capsule TAKE 1 CAPSULE 3 TIMES DAILY 270 capsule 1  . glipiZIDE (GLUCOTROL XL) 10 MG 24 hr tablet TAKE ONE TABLET BY MOUTH EVERY DAY WITH BREAKFAST 90 tablet 2  . [name] 100 MG tablet Take 100 mg by mouth daily.    . meloxicam (MOBIC) 15 MG tablet TAKE 1 TABLET BY MOUTH DAILY 30 tablet 1  . metFORMIN (GLUCOPHAGE-XR) 500 MG 24 hr tablet TAKE TWO TABLETS BY MOUTH TWICE DAILY WITH A MEAL 360 tablet 0  . Multiple Vitamin (MULTIVITAMIN WITH MINERALS) TABS Take 1 tablet by mouth daily.    . pantoprazole (PROTONIX) 40 MG tablet TAKE ONE (1) TABLET BY MOUTH TWO TIMES PER DAY 180 tablet 0  . quinapril (ACCUPRIL) 20 MG tablet Take 20 mg by mouth daily.    . sacubitril-valsartan (ENTRESTO) 24-26 MG Take 1 tablet by mouth 2 (two) times daily. 60 tablet 11  . spironolactone (ALDACTONE) 25 MG tablet TAKE 1/2 TABLET EVERY DAY 30 tablet 3  . nitroGLYCERIN (NITROSTAT) 0.4 MG SL tablet Place 1 tablet (0.4 mg total) under the tongue every 5 (five) minutes as needed for chest pain. (Patient not taking: Reported on 01/26/2019) 25 tablet 3   No current facility-administered medications for this visit.      Marland Kitchen  PHYSICAL EXAMINATION: ECOG PERFORMANCE STATUS: 0 - Asymptomatic  Vitals:   01/26/19 1123  BP: 133/65  Pulse: 64  Resp: 20  Temp: 99 F (37.2 C)   Filed Weights   01/26/19 1123  Weight: 261 lb 12.8 oz (118.8 kg)    Physical Exam  Constitutional: He is oriented to person, place, and time and well-developed, well-nourished, and in no distress.  HENT:  Head: Normocephalic and atraumatic.   Mouth/Throat: Oropharynx is clear and moist. No oropharyngeal exudate.  Eyes: Pupils are equal, round, and reactive to light.  Cardiovascular: Normal rate and regular rhythm.  Pulmonary/Chest: Effort normal and breath sounds normal. No respiratory distress. He has no wheezes.  Abdominal: Soft. Bowel sounds are  normal. He exhibits no distension and no mass. There is no abdominal tenderness. There is no rebound and no guarding.  Musculoskeletal:        General: No tenderness or edema. Normal range of motion.     Cervical back: Normal range of motion and neck supple.  Neurological: He is alert and oriented to person, place, and time.  Skin: Skin is warm.  Psychiatric: Affect normal.     LABORATORY DATA:  I have reviewed the data as listed Lab Results  Component Value Date   WBC 7.0 01/26/2019   HGB 13.1 01/26/2019   HCT 39.3 01/26/2019   MCV 96.1 01/26/2019   PLT 171 01/26/2019   Recent Labs    03/30/18 0930 03/30/18 0930 08/05/18 0726 10/26/18 0943 12/16/18 1016 01/26/19 1209  NA 139  --  139 138  --  140  K 4.3  --  4.4 4.0  --  4.2  CL 99  --  104 99  --  104  CO2 22  --  24 26  --  27  GLUCOSE 210*  --  192* 149*  --  120*  BUN 17  --  19 18  --  17  CREATININE 1.52*   < > 1.42* 1.53* 1.60* 1.60*  CALCIUM 9.5  --  9.0 9.6  --  9.0  GFRNONAA  --   --  50*  --   --  43*  GFRAA  --   --  58*  --   --  50*  PROT 6.8  --   --   --   --  6.7  ALBUMIN  --   --   --   --   --  3.6  AST 57*  --   --   --   --  49*  ALT 37  --   --   --   --  41  ALKPHOS  --   --   --   --   --  87  BILITOT 0.6  --   --   --   --  0.9   < > = values in this interval not displayed.    RADIOGRAPHIC STUDIES: I have personally reviewed the radiological images as listed and agreed with the findings in the report. No results found.  ASSESSMENT & PLAN:   Liver lesion #Liver lesion-approximately 1.8 cm; concerning for primary hepatocellular cancer/in the context of cirrhosis vs-metastatic  [less likely] vs. benign.  However, MRI-not conclusive.  Recommend a biopsy of the liver lesion.  Referral for IR.  #If primary hepatocellular cancer-performance status 0 ; adequate liver function-potential candidate for resection.   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  Unlikely cause of patient's liver lesion.  #Cirrhosis-compensated; Pugh child A [s/p eval 2017 [facility];GI].  Stable.  #CAD-on Plavix; patient will need to come off Plavix prior to biopsy. Mariea Clonts will contact cardiologist office.  # # I reviewed the blood work- with the patient in detail; also reviewed the imaging independently [as summarized above]; and with the patient in detail.   # Thank you Dr.Sonnenberg for allowing me to participate in the care of your pleasant patient. Please do not hesitate to contact me with questions or concerns in the interim.  # DISPOSITION: # labs today- ordered # US biopsy-1-2 weeks; # follow up 2-3 days after Biosp-Dr.B  All questions were answered. The patient knows to call the clinic with any problems, questions or concerns.  Cammie Sickle, MD 01/27/2019 7:24 AM

## 2019-01-26 NOTE — Assessment & Plan Note (Addendum)
#  Liver lesion-approximately 1.8 cm; concerning for primary hepatocellular cancer/in the context of cirrhosis vs-metastatic [less likely] vs. benign.  However, MRI-not conclusive.  Recommend a biopsy of the liver lesion.  Referral for IR.  #If primary hepatocellular cancer-performance status 0 ; adequate liver function-potential candidate for resection.   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  Unlikely cause of patient's liver lesion.  #Cirrhosis-compensated; Pugh child A [s/p eval 2017 [facility];GI].  Stable.  #CAD-on Plavix; patient will need to come off Plavix prior to biopsy. Mariea Clonts will contact cardiologist office.  # # I reviewed the blood work- with the patient in detail; also reviewed the imaging independently [as summarized above]; and with the patient in detail.   # Thank you Dr.Sonnenberg for allowing me to participate in the care of your pleasant patient. Please do not hesitate to contact me with questions or concerns in the interim.  # DISPOSITION: # labs today- ordered # US biopsy-1-2 weeks; # follow up 2-3 days after Biosp-Dr.B

## 2019-01-26 NOTE — Progress Notes (Signed)
Introduced Therapist, nutritional and provided contact information for future needs. We will obtain clearance from Dr. Fletcher Anon to hold Plavix prior to liver biopsy.

## 2019-01-26 NOTE — Telephone Encounter (Signed)
Invasive checklist sent to scheduling.    [name], MD  Mariea Clonts D, RN  Can hold Plavix 5 days before the procedure and resume after. Thanks  Dr. Fletcher Anon       Previous Messages   ----- Message -----  From: Clent Jacks, RN  Sent: 01/26/2019 12:41 PM EST  To: [name], MD  Subject: Plavix hold                    Hi Dr. Fletcher Anon, Dr. Rogue Bussing is planning a liver biopsy for Mr. Mastrangelo. We would like to obtain your clearance to hold his Plavix for this procedure. Thank you.   Jacqualine Weichel

## 2019-01-27 ENCOUNTER — Other Ambulatory Visit: Payer: Self-pay | Admitting: *Deleted

## 2019-01-27 DIAGNOSIS — K769 Liver disease, unspecified: Secondary | ICD-10-CM

## 2019-01-30 ENCOUNTER — Telehealth: Payer: Self-pay | Admitting: Internal Medicine

## 2019-01-30 DIAGNOSIS — K769 Liver disease, unspecified: Secondary | ICD-10-CM

## 2019-01-30 NOTE — Telephone Encounter (Signed)
On 12/19-spoke to patient's daughter, Kelly-regarding discussion with IR-difficulty biopsy/recommend MRI follow-up in 3 months/February 2020. Given incomplete initially lesion-recommend CT scan chest noncontrast for further evaluation.   C-please schedule CT chest scan in 1 week; will call with results.   FYI-Dr.Sonnenberg.

## 2019-01-31 ENCOUNTER — Other Ambulatory Visit: Payer: Self-pay | Admitting: Gastroenterology

## 2019-01-31 DIAGNOSIS — R160 Hepatomegaly, not elsewhere classified: Secondary | ICD-10-CM

## 2019-02-01 ENCOUNTER — Ambulatory Visit (INDEPENDENT_AMBULATORY_CARE_PROVIDER_SITE_OTHER): Payer: PPO | Admitting: Orthotics

## 2019-02-01 DIAGNOSIS — M2141 Flat foot [pes planus] (acquired), right foot: Secondary | ICD-10-CM | POA: Diagnosis not present

## 2019-02-01 DIAGNOSIS — I872 Venous insufficiency (chronic) (peripheral): Secondary | ICD-10-CM | POA: Diagnosis not present

## 2019-02-01 DIAGNOSIS — E0843 Diabetes mellitus due to underlying condition with diabetic autonomic (poly)neuropathy: Secondary | ICD-10-CM | POA: Diagnosis not present

## 2019-02-01 DIAGNOSIS — M2142 Flat foot [pes planus] (acquired), left foot: Secondary | ICD-10-CM

## 2019-02-01 DIAGNOSIS — M2140 Flat foot [pes planus] (acquired), unspecified foot: Secondary | ICD-10-CM

## 2019-02-01 NOTE — Progress Notes (Signed)

## 2019-02-08 ENCOUNTER — Ambulatory Visit: Payer: PPO

## 2019-02-14 ENCOUNTER — Other Ambulatory Visit: Payer: Self-pay

## 2019-02-14 ENCOUNTER — Ambulatory Visit
Admission: RE | Admit: 2019-02-14 | Discharge: 2019-02-14 | Disposition: A | Discharge status: Home / Self Care | Payer: PPO | Source: Ambulatory Visit | Attending: Internal Medicine | Admitting: Internal Medicine

## 2019-02-14 DIAGNOSIS — K769 Liver disease, unspecified: Secondary | ICD-10-CM | POA: Diagnosis not present

## 2019-02-14 DIAGNOSIS — I2510 Atherosclerotic heart disease of native coronary artery without angina pectoris: Secondary | ICD-10-CM | POA: Diagnosis not present

## 2019-02-14 DIAGNOSIS — K7460 Unspecified cirrhosis of liver: Secondary | ICD-10-CM | POA: Diagnosis not present

## 2019-02-14 DIAGNOSIS — Z87891 Personal history of nicotine dependence: Secondary | ICD-10-CM | POA: Insufficient documentation

## 2019-02-14 DIAGNOSIS — K8080 Other cholelithiasis without obstruction: Secondary | ICD-10-CM | POA: Insufficient documentation

## 2019-02-14 DIAGNOSIS — R918 Other nonspecific abnormal finding of lung field: Secondary | ICD-10-CM | POA: Diagnosis not present

## 2019-02-14 DIAGNOSIS — I700 Atherosclerosis of aorta: Secondary | ICD-10-CM | POA: Insufficient documentation

## 2019-02-14 DIAGNOSIS — J439 Emphysema, unspecified: Secondary | ICD-10-CM | POA: Insufficient documentation

## 2019-02-15 ENCOUNTER — Telehealth: Payer: Self-pay | Admitting: *Deleted

## 2019-02-15 ENCOUNTER — Telehealth: Payer: Self-pay | Admitting: Internal Medicine

## 2019-02-15 DIAGNOSIS — K769 Liver disease, unspecified: Secondary | ICD-10-CM

## 2019-02-15 NOTE — Telephone Encounter (Signed)
Daughter returned my phone call. Plan of care of discussed. Daughter ok with proceeding with MRI as recommended.  She states that she will discuss the plan above with her dad. She states that her dad is "getting frustrated with not knowing what is going on with him."   Also - At this time, pt is not set up with Mychart. Discussed mychart features with daughter. She will consider this as a communication option and let us know if she needs any assistance setting this up.

## 2019-02-15 NOTE — Telephone Encounter (Signed)
[id number]-Attempted to reach patient's daughter at her workplace- [employer] [phone]. Unable to reach daughter. Vm on dental office states office is "currently closed for lunch." 2 attempts made to reach daughter at [facility] - unable to get through to phone line. Contacted daughter via cell phone listed [phone]. Left vm that call was being returned.

## 2019-02-15 NOTE — Telephone Encounter (Signed)
On 1/05- LVM for daugter, [name] to call us back to discuss the CT scan.  CT scan shows-small 2 mm bilateral lung nodules/likley benign; recommend follow-up CT imaging in approximately 1 year.  I Would recommend MRI of the liver with and without contrast in mid February 2021.   H/T- please reach out to daughter to discuss the above-if she has questions I will be happy to talk.  Please let me know.   C-I have ordered MRI of the liver for mid February 2021; schedule follow-up-1 to 2 days post liver imaging.

## 2019-02-15 NOTE — Telephone Encounter (Signed)
Daughter called stating she is returning a call from doctor. Please return her call [phone] she will be at this number until 430 then can be reached on her cell @ [phone]

## 2019-02-21 ENCOUNTER — Other Ambulatory Visit: Payer: Self-pay | Admitting: Family Medicine

## 2019-02-21 ENCOUNTER — Other Ambulatory Visit: Payer: Self-pay | Admitting: Podiatry

## 2019-03-07 ENCOUNTER — Ambulatory Visit: Payer: PPO | Admitting: Family Medicine

## 2019-03-27 ENCOUNTER — Ambulatory Visit
Admission: RE | Admit: 2019-03-27 | Discharge: 2019-03-27 | Disposition: A | Discharge status: Home / Self Care | Payer: PPO | Source: Ambulatory Visit | Attending: Internal Medicine | Admitting: Internal Medicine

## 2019-03-27 ENCOUNTER — Other Ambulatory Visit: Payer: PPO

## 2019-03-27 ENCOUNTER — Other Ambulatory Visit: Payer: Self-pay

## 2019-03-27 DIAGNOSIS — K769 Liver disease, unspecified: Secondary | ICD-10-CM | POA: Diagnosis not present

## 2019-03-27 DIAGNOSIS — K7460 Unspecified cirrhosis of liver: Secondary | ICD-10-CM | POA: Diagnosis not present

## 2019-03-27 LAB — POCT I-STAT CREATININE: Creatinine, Ser: 1.5 mg/dL — ABNORMAL HIGH (ref 0.61–1.24)

## 2019-03-27 MED ORDER — GADOBUTROL 1 MMOL/ML IV SOLN
10.0000 mL | Freq: Once | INTRAVENOUS | Status: AC | PRN
  Administered 2019-03-27: 09:00:00 10 mL via INTRAVENOUS

## 2019-03-29 ENCOUNTER — Inpatient Hospital Stay: Payer: PPO | Attending: Internal Medicine | Admitting: Internal Medicine

## 2019-03-29 NOTE — Progress Notes (Unsigned)
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 Cause ? NASH; GI w/u- [place] 2017];   # colonoscopy-dec 2020 polyps [Dr.Wohl]; s/p resection; EGD - bleeding ulcer- ? Asprin [2016; Dr.Buccini; [place]]; CKD- III [40s]; CAD [Dr.Arida]   Penile cancer ([place])  06/28/2015 Initial Diagnosis   Penile cancer (HCC)      HISTORY OF PRESENTING ILLNESS:  Devin Hanna 69 y.o.  male with a history of cirrhosis; stage I penile cancer; also history of chronic kidney disease is here for follow-up to discuss further work-up regarding liver lesion.    Patient undergoing work-up for chronic kidney disease-stage III with a kidney ultrasound showed incidental liver lesion.  This was further worked up by MRI in November-that showed 1.8 cm lesion in the right lobe of the liver.   Patient denies any symptoms.  Denies any weight loss.  Any nausea vomiting.   Review of Systems  Constitutional: Negative for chills, diaphoresis, fever, malaise/fatigue and weight loss.  HENT: Negative for nosebleeds and sore throat.   Eyes: Negative for double vision.  Respiratory: Negative for cough, hemoptysis, sputum production, shortness of breath and wheezing.   Cardiovascular: Negative for chest pain, palpitations, orthopnea and leg swelling.  Gastrointestinal: Negative for abdominal pain, blood in stool, constipation, diarrhea, heartburn, melena, nausea and vomiting.  Genitourinary: Negative for dysuria, frequency and urgency.  Musculoskeletal: Negative for back pain and joint pain.  Skin: Negative.   Negative for itching and rash.  Neurological: Negative for dizziness, tingling, focal weakness, weakness and headaches.  Endo/Heme/Allergies: Does not bruise/bleed easily.  Psychiatric/Behavioral: Negative for depression. The patient is not nervous/anxious and does not have insomnia.      MEDICAL HISTORY:  Past Medical History:  Diagnosis Date  . Acute respiratory failure with hypoxia ([place]) 06/2016  . CAD (coronary artery disease)    a. Remote PCI of the RCA;  b. 03/2013 Inf STEMI/PCI: LAD 60-70d, RCA 99p ISR(rota/CBA/DES);  c. 09/2014 MV: inf and infsept infarct, EF 35-40%;  d. 09/2014 Cath: patent RCA stent, stable LAD dzs, EF 40%-->Med rx.  . Diabetes mellitus    sees Dr. Hardin Negus in [place]  . GERD (gastroesophageal reflux disease)   . HFrEF (heart failure with reduced ejection fraction) ([place])   . Hyperlipidemia   . Ischemic cardiomyopathy    a. 09/2014 EF 40% by V gram;  b. 06/2016 Echo: EF 40, diff HK, Gr1 DD, inf HK, no mural thrombus. Mild MR, mildly dil LA, mildly dil RV.   . LBBB (left bundle branch block)    Old  . Morbid obesity ([place])   . Peripheral neuropathy   . Upper GI bleed   . Vertigo     SURGICAL HISTORY: Past Surgical History:  Procedure Laterality Date  . CARDIAC CATHETERIZATION N/A 10/03/2014   Procedure: Left Heart Cath and Coronary Angiography;  Surgeon: [name], MD;  Location: [facility];  Service: Cardiovascular;  Laterality: N/A;  . CIRCUMCISION N/A 07/22/2015   Procedure: CIRCUMCISION ADULT;  Surgeon: Hollice Espy, MD;  Location: [facility] ORS;  Service: Urology;  Laterality:  N/A;  . COLONOSCOPY WITH PROPOFOL N/A 01/12/2019   Procedure: COLONOSCOPY WITH PROPOFOL;  Surgeon: Lucilla Lame, MD;  Location: [facility] ENDOSCOPY;  Service: Endoscopy;  Laterality: N/A;  . CORONARY STENT PLACEMENT    . ESOPHAGOGASTRODUODENOSCOPY (EGD) WITH PROPOFOL N/A 09/12/2014   Procedure: ESOPHAGOGASTRODUODENOSCOPY (EGD) WITH PROPOFOL;  Surgeon: Ronald Lobo, MD;   Location: [facility] ENDOSCOPY;  Service: Endoscopy;  Laterality: N/A;  . EXTERNAL FIXATION LEG Left 04/03/2012   Procedure: EXTERNAL FIXATION LEG;  Surgeon: Wylene Simmer, MD;  Location: [facility];  Service: Orthopedics;  Laterality: Left;  . EXTERNAL FIXATION REMOVAL Left 04/14/2012   Procedure: REMOVAL EXTERNAL FIXATION LEG;  Surgeon: Wylene Simmer, MD;  Location: [facility];  Service: Orthopedics;  Laterality: Left;  . IRRIGATION AND DEBRIDEMENT KNEE Left 04/03/2012   Procedure: IRRIGATION AND DEBRIDEMENT KNEE;  Surgeon: Wylene Simmer, MD;  Location: [facility];  Service: Orthopedics;  Laterality: Left;  . LEFT HEART CATHETERIZATION WITH CORONARY ANGIOGRAM N/A 04/05/2013   Procedure: LEFT HEART CATHETERIZATION WITH CORONARY ANGIOGRAM;  Surgeon: [name], MD;  Location: [facility] CATH LAB;  Service: Cardiovascular;  Laterality: N/A;  . MASS EXCISION N/A 07/22/2015   Procedure: EXCISION PENILE MASS;  Surgeon: Hollice Espy, MD;  Location: [facility] ORS;  Service: Urology;  Laterality: N/A;  . ORIF TIBIA PLATEAU Left 04/14/2012   Procedure: OPEN REDUCTION INTERNAL FIXATION (ORIF) TIBIAL PLATEAU;  Surgeon: Wylene Simmer, MD;  Location: [facility];  Service: Orthopedics;  Laterality: Left;  . PERCUTANEOUS CORONARY ROTOBLATOR INTERVENTION (PCI-R) N/A 04/06/2013   Procedure: PERCUTANEOUS CORONARY ROTOBLATOR INTERVENTION (PCI-R);  Surgeon: [name], MD;  Location: [facility] CATH LAB;  Service: Cardiovascular;  Laterality: N/A;    SOCIAL HISTORY: Social History   Socioeconomic History  . Marital status: Divorced    Spouse name: Not on file  . Number of children: Not on file  . Years of education: Not on file  . Highest education level: Not on file  Occupational History  . Occupation: heavy Company secretary    Employer: [employer]  Tobacco Use  . Smoking status: Former Smoker    Packs/day: 3.00    Years: 40.00    Pack years: 120.00    Types: Cigarettes    Quit date: 04/05/1995    Years since quitting: 23.9  . Smokeless  tobacco: Former Systems developer  . Tobacco comment: started smoking at age 7  Substance and Sexual Activity  . Alcohol use: No    Comment: Occassional Use  . Drug use: No  . Sexual activity: Not on file  Other Topics Concern  . Not on file  Social History Narrative   Heavy Company secretary.; Divorced; quit smoking 25 years; beer rare; by self. Still riding motor cycles.    Social Determinants of Health   Financial Resource Strain:   . Difficulty of Paying Living Expenses: Not on file  Food Insecurity:   . Worried About Charity fundraiser in the Last Year: Not on file  . Ran Out of Food in the Last Year: Not on file  Transportation Needs:   . Lack of Transportation (Medical): Not on file  . Lack of Transportation (Non-Medical): Not on file  Physical Activity:   . Days of Exercise per Week: Not on file  . Minutes of Exercise per Session: Not on file  Stress:   . Feeling of Stress : Not on file  Social Connections:   . Frequency of Communication with Friends and Family: Not on file  . Frequency of Social Gatherings with  Friends and Family: Not on file  . Attends Religious Services: Not on file  . Active Member of Clubs or Organizations: Not on file  . Attends Archivist Meetings: Not on file  . Marital Status: Not on file  Intimate Partner Violence:   . Fear of Current or Ex-Partner: Not on file  . Emotionally Abused: Not on file  . Physically Abused: Not on file  . Sexually Abused: Not on file    FAMILY HISTORY: Family History  Problem Relation Age of Onset  . Coronary artery disease Father   . Coronary artery disease Brother   . Lung cancer Brother   . Breast cancer Sister   . Prostate cancer Brother   . Stroke Sister     ALLERGIES:  has No Known Allergies.  MEDICATIONS:  Current Outpatient Medications  Medication Sig Dispense Refill  . atorvastatin (LIPITOR) 40 MG tablet Take 1 tablet (40 mg total) by mouth daily. 90 tablet 0  . carvedilol (COREG) 12.5 MG  tablet Take 1 tablet (12.5 mg total) by mouth 2 (two) times daily with a meal. 180 tablet 3  . Cinnamon 500 MG capsule Take 1,000 mg by mouth 2 (two) times daily with a meal.    . clopidogrel (PLAVIX) 75 MG tablet TAKE 1 TABLET BY MOUTH EVERY MORNING WITH BREAKFAST 90 tablet 0  . furosemide (LASIX) 20 MG tablet TAKE 2 TABLETS BY MOUTH EVERY MORNING AND TAKE 1 TABLET EVERY EVENING 270 tablet 3  . gabapentin (NEURONTIN) 300 MG capsule TAKE 1 CAPSULE 3 TIMES DAILY 270 capsule 1  . glipiZIDE (GLUCOTROL XL) 10 MG 24 hr tablet TAKE ONE TABLET BY MOUTH EVERY DAY WITH BREAKFAST 90 tablet 2  . [name] 100 MG tablet Take 100 mg by mouth daily.    . meloxicam (MOBIC) 15 MG tablet TAKE 1 TABLET BY MOUTH DAILY 30 tablet 1  . metFORMIN (GLUCOPHAGE-XR) 500 MG 24 hr tablet TAKE TWO TABLETS BY MOUTH TWICE DAILY WITH A MEAL 360 tablet 0  . Multiple Vitamin (MULTIVITAMIN WITH MINERALS) TABS Take 1 tablet by mouth daily.    . nitroGLYCERIN (NITROSTAT) 0.4 MG SL tablet Place 1 tablet (0.4 mg total) under the tongue every 5 (five) minutes as needed for chest pain. (Patient not taking: Reported on 01/26/2019) 25 tablet 3  . pantoprazole (PROTONIX) 40 MG tablet TAKE ONE (1) TABLET BY MOUTH TWO TIMES PER DAY 180 tablet 0  . quinapril (ACCUPRIL) 20 MG tablet Take 20 mg by mouth daily.    . sacubitril-valsartan (ENTRESTO) 24-26 MG Take 1 tablet by mouth 2 (two) times daily. 60 tablet 11  . spironolactone (ALDACTONE) 25 MG tablet TAKE 1/2 TABLET EVERY DAY 30 tablet 3   No current facility-administered medications for this visit.      Marland Kitchen  PHYSICAL EXAMINATION: ECOG PERFORMANCE STATUS: 0 - Asymptomatic  There were no vitals filed for this visit. There were no vitals filed for this visit.  Physical Exam  Constitutional: He is oriented to person, place, and time and well-developed, well-nourished, and in no distress.  HENT:  Head: Normocephalic and atraumatic.  Mouth/Throat: Oropharynx is clear and moist. No  oropharyngeal exudate.  Eyes: Pupils are equal, round, and reactive to light.  Cardiovascular: Normal rate and regular rhythm.  Pulmonary/Chest: Effort normal and breath sounds normal. No respiratory distress. He has no wheezes.  Abdominal: Soft. Bowel sounds are normal. He exhibits no distension and no mass. There is no abdominal tenderness. There is no rebound and no  guarding.  Musculoskeletal:        General: No tenderness or edema. Normal range of motion.     Cervical back: Normal range of motion and neck supple.  Neurological: He is alert and oriented to person, place, and time.  Skin: Skin is warm.  Psychiatric: Affect normal.     LABORATORY DATA:  I have reviewed the data as listed Lab Results  Component Value Date   WBC 7.0 01/26/2019   HGB 13.1 01/26/2019   HCT 39.3 01/26/2019   MCV 96.1 01/26/2019   PLT 171 01/26/2019   Recent Labs    03/30/18 0930 03/30/18 0930 08/05/18 0726 08/05/18 0726 10/26/18 0943 10/26/18 0943 12/16/18 1016 01/26/19 1209 03/27/19 0806  NA 139   < > 139  --  138  --   --  140  --   K 4.3   < > 4.4  --  4.0  --   --  4.2  --   CL 99   < > 104  --  99  --   --  104  --   CO2 22   < > 24  --  26  --   --  27  --   GLUCOSE 210*   < > 192*  --  149*  --   --  120*  --   BUN 17   < > 19  --  18  --   --  17  --   CREATININE 1.52*  --  1.42*   < > 1.53*   < > 1.60* 1.60* 1.50*  CALCIUM 9.5   < > 9.0  --  9.6  --   --  9.0  --   GFRNONAA  --   --  50*  --   --   --   --  43*  --   GFRAA  --   --  58*  --   --   --   --  50*  --   PROT 6.8  --   --   --   --   --   --  6.7  --   ALBUMIN  --   --   --   --   --   --   --  3.6  --   AST 57*  --   --   --   --   --   --  49*  --   ALT 37  --   --   --   --   --   --  41  --   ALKPHOS  --   --   --   --   --   --   --  87  --   BILITOT 0.6  --   --   --   --   --   --  0.9  --    < > = values in this interval not displayed.    RADIOGRAPHIC STUDIES: I have personally reviewed the radiological  images as listed and agreed with the findings in the report. MR LIVER W WO CONTRAST  Result Date: 03/27/2019 CLINICAL DATA:  Cirrhosis and liver lesion.  Follow-up. EXAM: MRI ABDOMEN WITHOUT AND WITH CONTRAST TECHNIQUE: Multiplanar multisequence MR imaging of the abdomen was performed both before and after the administration of intravenous contrast. CONTRAST:  34m GADAVIST GADOBUTROL 1 MMOL/ML IV SOLN COMPARISON:  MRI of 12/16/2018. FINDINGS: Lower chest: Normal heart size without pericardial  or pleural effusion. Hepatobiliary: Moderate cirrhosis again identified. There are scattered areas of arterial hyperenhancement including in the high right hepatic lobe on 25/12 and the subcapsular right hepatic lobe on 53/12. These are felt to be similar to on the prior exam, not identified on other pulse sequences, and most likely perfusion anomalies. The nodule of concern, within segment 5, is again identified as evidenced by arterial hyperenhancement. 1.5 x 1.1 cm on 46/12. Compare 1.7 x 1.3 cm on the prior exam (when remeasured). Vaguely hyperenhancing on later post-contrast imaging, including on 46/15. No evidence of washout or delayed enhancing capsule. Small gallstones without acute cholecystitis or biliary duct dilatation. Pancreas:  Normal, without mass or ductal dilatation. Spleen:  Normal in size, without focal abnormality. Adrenals/Urinary Tract: Normal adrenal glands. Interpolar right renal 6 mm lesion is technically too small to characterize but likely a cyst. Normal left kidney. Stomach/Bowel: Normal stomach and abdominal bowel loops. Vascular/Lymphatic: Patent portal and hepatic veins. Portal venous hypertension, as evidenced by portosystemic collaterals in the left upper quadrant. Aortic atherosclerosis. Prominent porta hepatis nodes, including at 1.4 cm on 32/12, likely reactive. Other:  No ascites. Musculoskeletal: No acute osseous abnormality. IMPRESSION: 1. Redemonstration of hypervascular segment 5  liver lesion. This measures smaller today, possibly due to differences in bolus timing. No washout or capsule formation. This remains a LR 3 lesion. 2. Cirrhosis and portal venous hypertension. 3.  Aortic Atherosclerosis (ICD10-I70.0). 4. Cholelithiasis. Electronically Signed   By: Abigail Miyamoto M.D.   On: 03/27/2019 08:58    ASSESSMENT & PLAN:   No problem-specific Assessment & Plan notes found for this encounter.  All questions were answered. The patient knows to call the clinic with any problems, questions or concerns.    Cammie Sickle, MD 03/29/2019 8:26 AM

## 2019-03-29 NOTE — Assessment & Plan Note (Signed)
#  Liver lesion-approximately 1.8 cm; concerning for primary hepatocellular cancer/in the context of cirrhosis vs-metastatic [less likely] vs. benign.  However, MRI-not conclusive.  Recommend a biopsy of the liver lesion.  Referral for IR.  #If primary hepatocellular cancer-performance status 0 ; adequate liver function-potential candidate for resection.   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  Unlikely cause of patient's liver lesion.  #Cirrhosis-compensated; Pugh child A [s/p eval 2017 [facility];GI].  Stable.  #CAD-on Plavix; patient will need to come off Plavix prior to biopsy. Mariea Clonts will contact cardiologist office.  # # I reviewed the blood work- with the patient in detail; also reviewed the imaging independently [as summarized above]; and with the patient in detail.   # Thank you Dr.Sonnenberg for allowing me to participate in the care of your pleasant patient. Please do not hesitate to contact me with questions or concerns in the interim.  # DISPOSITION: # labs today- ordered # US biopsy-1-2 weeks; # follow up 2-3 days after Biosp-Dr.B

## 2019-03-30 ENCOUNTER — Telehealth: Payer: Self-pay | Admitting: Internal Medicine

## 2019-03-30 DIAGNOSIS — K769 Liver disease, unspecified: Secondary | ICD-10-CM

## 2019-03-30 NOTE — Telephone Encounter (Signed)
On 2/17-spoke to patient's daughter [phone].  MRI liver clinically stable.  Would recommend repeat MRI of the liver in 4 months.  #Please schedule-MD; labs-CBC CMP AFP; MRI liver-1-2 days prior-Dr.B  FYI-Dr.Sonnenberg

## 2019-04-03 ENCOUNTER — Encounter: Payer: Self-pay | Admitting: Family Medicine

## 2019-04-03 ENCOUNTER — Ambulatory Visit (INDEPENDENT_AMBULATORY_CARE_PROVIDER_SITE_OTHER): Payer: PPO | Admitting: Family Medicine

## 2019-04-03 VITALS — BP 120/80 | HR 77 | Temp 97.8°F | Ht 66.0 in | Wt 267.0 lb

## 2019-04-03 DIAGNOSIS — I5022 Chronic systolic (congestive) heart failure: Secondary | ICD-10-CM | POA: Diagnosis not present

## 2019-04-03 DIAGNOSIS — L570 Actinic keratosis: Secondary | ICD-10-CM | POA: Diagnosis not present

## 2019-04-03 DIAGNOSIS — K769 Liver disease, unspecified: Secondary | ICD-10-CM

## 2019-04-03 DIAGNOSIS — E1142 Type 2 diabetes mellitus with diabetic polyneuropathy: Secondary | ICD-10-CM

## 2019-04-03 DIAGNOSIS — I10 Essential (primary) hypertension: Secondary | ICD-10-CM | POA: Diagnosis not present

## 2019-04-03 LAB — COMPREHENSIVE METABOLIC PANEL
ALT: 38 U/L (ref 0–53)
AST: 43 U/L — ABNORMAL HIGH (ref 0–37)
Albumin: 4 g/dL (ref 3.5–5.2)
Alkaline Phosphatase: 81 U/L (ref 39–117)
BUN: 25 mg/dL — ABNORMAL HIGH (ref 6–23)
CO2: 29 mEq/L (ref 19–32)
Calcium: 9.5 mg/dL (ref 8.4–10.5)
Chloride: 101 mEq/L (ref 96–112)
Creatinine, Ser: 1.68 mg/dL — ABNORMAL HIGH (ref 0.40–1.50)
GFR: 40.48 mL/min — ABNORMAL LOW (ref >60.00)
Glucose, Bld: 132 mg/dL — ABNORMAL HIGH (ref 70–99)
Potassium: 4.2 mEq/L (ref 3.5–5.1)
Sodium: 140 mEq/L (ref 135–145)
Total Bilirubin: 0.7 mg/dL (ref 0.2–1.2)
Total Protein: 6.9 g/dL (ref 6.0–8.3)

## 2019-04-03 LAB — LIPID PANEL
Cholesterol: 130 mg/dL (ref 0–200)
HDL: 45.9 mg/dL (ref >39.00)
LDL Cholesterol: 44 mg/dL (ref 0–99)
NonHDL: 84.16
Total CHOL/HDL Ratio: 3
Triglycerides: 199 mg/dL — ABNORMAL HIGH (ref 0.0–149.0)
VLDL: 39.8 mg/dL (ref 0.0–40.0)

## 2019-04-03 LAB — HEMOGLOBIN A1C: Hgb A1c MFr Bld: 6.1 % (ref 4.6–6.5)

## 2019-04-03 NOTE — Patient Instructions (Signed)
Nice to see you. We will get lab work today and contact you with the results. Please contact your eye doctor to see when you are due for a visit. We will refer you to dermatology.

## 2019-04-03 NOTE — Assessment & Plan Note (Signed)
MRI has not been conclusive.  Patient is following with oncology.  Advised to contact us or them if he develops any abdominal pain.

## 2019-04-03 NOTE — Assessment & Plan Note (Signed)
Stable.  Continue current regimen.  Check A1c.  Discussed appropriate hypoglycemic response.

## 2019-04-03 NOTE — Progress Notes (Signed)
Devin Rumps, MD Phone: [phone]  Devin Hanna is a 69 y.o. male who presents today for f/u.  HYPERTENSION/CHF Disease Monitoring  Home BP Monitoring 130/60s Chest pain- no    Dyspnea- no Medications  Compliance-  Taking quinapril, entresto, spironolactone, coreg, lasix.  Edema- chronic and intermittent related to venous insufficiency, goes down over night, no orthopnea or PND  DIABETES Disease Monitoring: Blood Sugar ranges-130-140 Polyuria/phagia/dipsia- no      Optho- due Medications: Compliance- taking metformin, januvia, glipizide Hypoglycemic symptoms- rare, eats a cookie and feels better  Liver Lesion: Stable on MRI.  He has seen oncology for this.  There was some discussion of potential biopsy the they have recommended 81-monthfollow-up MRI.  He notes no right upper quadrant pain.  Actinic keratoses: These are noted on his scalp during exam.  He has seen dermatology previously though not recently.  Social History   Tobacco Use  Smoking Status Former Smoker  . Packs/day: 3.00  . Years: 40.00  . Pack years: 120.00  . Types: Cigarettes  . Quit date: 04/05/1995  . Years since quitting: 24.0  Smokeless Tobacco Former USystems developer Tobacco Comment   started smoking at age 69    ROS see history of present illness  Objective  Physical Exam Vitals:   04/03/19 0818  BP: 120/80  Pulse: 77  Temp: 97.8 F (36.6 C)  SpO2: 96%    BP Readings from Last 3 Encounters:  04/03/19 120/80  01/26/19 133/65  01/12/19 (!) 121/56   Wt Readings from Last 3 Encounters:  04/03/19 267 lb (121.1 kg)  01/26/19 261 lb 12.8 oz (118.8 kg)  01/12/19 260 lb (117.9 kg)    Physical Exam Constitutional:      General: He is not in acute distress.    Appearance: He is not diaphoretic.  Cardiovascular:     Rate and Rhythm: Normal rate and regular rhythm.     Heart sounds: Normal heart sounds.  Pulmonary:     Effort: Pulmonary effort is normal.     Breath sounds: Normal breath  sounds.  Musculoskeletal:     Comments: 1+ pitting edema bilateral lower extremities  Skin:    General: Skin is warm and dry.     Comments: Scattered actinic keratoses over his scalp  Neurological:     Mental Status: He is alert.      Assessment/Plan: Please see individual problem list.  Chronic systolic heart failure (HCC) Stable.  Continue current regimen.  HTN (hypertension) Well-controlled.  Continue current regimen.  Check BMP.  DM type 2 (diabetes mellitus, type 2) (HCC) Stable.  Continue current regimen.  Check A1c.  Discussed appropriate hypoglycemic response.  Liver lesion MRI has not been conclusive.  Patient is following with oncology.  Advised to contact [place] them if he develops any abdominal pain.  AK (actinic keratosis) Refer to dermatology to consider treatment.   Health maintenance: Patient will contact his ophthalmologist to determine when he is due for a visit.  Orders Placed This Encounter  Procedures  . Lipid panel  . Comp Met (CMET)  . HgB A1c  . Ambulatory referral to Dermatology    Referral Priority:   Routine    Referral Type:   Consultation    Referral Reason:   Specialty Services Required    Requested Specialty:   Dermatology    Number of Visits Requested:   1    No orders of the defined types were placed in this encounter.   This  visit occurred during the SARS-CoV-2 public health emergency.  Safety protocols were in place, including screening questions prior to the visit, additional usage of staff PPE, and extensive cleaning of exam room while observing appropriate contact time as indicated for disinfecting solutions.    Devin Rumps, MD [place]

## 2019-04-03 NOTE — Assessment & Plan Note (Signed)
Well-controlled.  Continue current regimen.  Check BMP.

## 2019-04-03 NOTE — Assessment & Plan Note (Signed)
Refer to dermatology to consider treatment.

## 2019-04-03 NOTE — Assessment & Plan Note (Signed)
Stable  Continue current regimen  

## 2019-04-12 ENCOUNTER — Telehealth: Payer: Self-pay | Admitting: Family Medicine

## 2019-04-12 NOTE — Telephone Encounter (Signed)
I called nad gave the daughter the lab results.  Devin Hanna,cma

## 2019-04-12 NOTE — Telephone Encounter (Signed)
Pt daughter called to get her fathers lab results

## 2019-04-18 ENCOUNTER — Other Ambulatory Visit: Payer: Self-pay | Admitting: Podiatry

## 2019-04-18 ENCOUNTER — Other Ambulatory Visit: Payer: Self-pay | Admitting: Family Medicine

## 2019-04-18 ENCOUNTER — Other Ambulatory Visit: Payer: Self-pay | Admitting: Cardiovascular Disease

## 2019-04-19 ENCOUNTER — Other Ambulatory Visit: Payer: Self-pay

## 2019-04-19 MED ORDER — ATORVASTATIN CALCIUM 40 MG PO TABS: 40.0000 mg | ORAL_TABLET | Freq: Every day | ORAL | 0 refills | Status: DC

## 2019-05-02 DIAGNOSIS — Z1283 Encounter for screening for malignant neoplasm of skin: Secondary | ICD-10-CM | POA: Diagnosis not present

## 2019-05-02 DIAGNOSIS — D492 Neoplasm of unspecified behavior of bone, soft tissue, and skin: Secondary | ICD-10-CM | POA: Diagnosis not present

## 2019-05-02 DIAGNOSIS — L570 Actinic keratosis: Secondary | ICD-10-CM | POA: Diagnosis not present

## 2019-05-02 DIAGNOSIS — L821 Other seborrheic keratosis: Secondary | ICD-10-CM | POA: Diagnosis not present

## 2019-05-19 ENCOUNTER — Other Ambulatory Visit: Payer: Self-pay | Admitting: Cardiovascular Disease

## 2019-05-19 ENCOUNTER — Other Ambulatory Visit: Payer: Self-pay | Admitting: Family Medicine

## 2019-05-22 ENCOUNTER — Other Ambulatory Visit: Payer: Self-pay

## 2019-06-20 ENCOUNTER — Other Ambulatory Visit: Payer: Self-pay | Admitting: Cardiovascular Disease

## 2019-06-29 ENCOUNTER — Ambulatory Visit (INDEPENDENT_AMBULATORY_CARE_PROVIDER_SITE_OTHER): Payer: PPO | Admitting: Cardiovascular Disease

## 2019-06-29 ENCOUNTER — Other Ambulatory Visit: Payer: Self-pay

## 2019-06-29 ENCOUNTER — Encounter: Payer: Self-pay | Admitting: Cardiovascular Disease

## 2019-06-29 VITALS — BP 122/74 | HR 66 | Ht 67.0 in | Wt 270.5 lb

## 2019-06-29 DIAGNOSIS — E782 Mixed hyperlipidemia: Secondary | ICD-10-CM

## 2019-06-29 DIAGNOSIS — I10 Essential (primary) hypertension: Secondary | ICD-10-CM

## 2019-06-29 DIAGNOSIS — I5022 Chronic systolic (congestive) heart failure: Secondary | ICD-10-CM

## 2019-06-29 DIAGNOSIS — I2510 Atherosclerotic heart disease of native coronary artery without angina pectoris: Secondary | ICD-10-CM

## 2019-06-29 NOTE — Progress Notes (Signed)
Cardiology Office Note   Date:  06/29/2019   ID:  Bolton, Canupp [birth year], MRN [id number]  PCP:  [name], MD  Cardiologist:   [name], MD   Chief Complaint  Patient presents with  . OTHER    OD 6 month f/u no complaints today. Meds reviewed verbally with pt.      History of Present Illness: Devin Hanna is a 69 y.o. male who presents for a followup visit regarding coronary artery disease and chronic systolic heart failure.  He has known CAD with past RCA stents and prior MI, chronic systolic heart failure, peripheral neuropathy, left bundle branch block, prior GI bleed GERD, DM, HTN and HLD.  Most recent hospitalization was in May of 2018 for acute on chronic systolic heart failure.  Most recent echocardiogram in June of 2020 showed an EF of 35 to 40% which has been stable.  He was transitioned from ACE inhibitor to Nebo after that.  He had hypotension on the higher dose which had to be decreased. He has been doing well with no recent chest pain or worsening dyspnea.  He has not been able to lose any weight and he seems to be deconditioned overall.  He was found to have a liver lesion recently with it was too small to biopsy.  It is being monitored.  Quinapril is still listed on his medication list and he does not know if he is taking it or not.  He is not supposed to be on this given that he is on Entresto.    Past Medical History:  Diagnosis Date  . Acute respiratory failure with hypoxia ([place]) 06/2016  . CAD (coronary artery disease)    a. Remote PCI of the RCA;  b. 03/2013 Inf STEMI/PCI: LAD 60-70d, RCA 99p ISR(rota/CBA/DES);  c. 09/2014 MV: inf and infsept infarct, EF 35-40%;  d. 09/2014 Cath: patent RCA stent, stable LAD dzs, EF 40%-->Med rx.  . Diabetes mellitus    sees Dr. Hardin Negus in [place]  . GERD (gastroesophageal reflux disease)   . HFrEF (heart failure with reduced ejection fraction) ([place])   . Hyperlipidemia   . Ischemic  cardiomyopathy    a. 09/2014 EF 40% by V gram;  b. 06/2016 Echo: EF 40, diff HK, Gr1 DD, inf HK, no mural thrombus. Mild MR, mildly dil LA, mildly dil RV.   . LBBB (left bundle branch block)    Old  . Morbid obesity ([place])   . Peripheral neuropathy   . Upper GI bleed   . Vertigo     Past Surgical History:  Procedure Laterality Date  . CARDIAC CATHETERIZATION N/A 10/03/2014   Procedure: Left Heart Cath and Coronary Angiography;  Surgeon: [name], MD;  Location: [facility];  Service: Cardiovascular;  Laterality: N/A;  . CIRCUMCISION N/A 07/22/2015   Procedure: CIRCUMCISION ADULT;  Surgeon: Hollice Espy, MD;  Location: [facility] ORS;  Service: Urology;  Laterality: N/A;  . COLONOSCOPY WITH PROPOFOL N/A 01/12/2019   Procedure: COLONOSCOPY WITH PROPOFOL;  Surgeon: Lucilla Lame, MD;  Location: [facility] ENDOSCOPY;  Service: Endoscopy;  Laterality: N/A;  . CORONARY STENT PLACEMENT    . ESOPHAGOGASTRODUODENOSCOPY (EGD) WITH PROPOFOL N/A 09/12/2014   Procedure: ESOPHAGOGASTRODUODENOSCOPY (EGD) WITH PROPOFOL;  Surgeon: Ronald Lobo, MD;  Location: [facility] END[facility]COPY;  Service: Endoscopy;  Laterality: N/A;  . EXTERNAL FIXATION LEG Left 04/03/2012   Procedure: EXTERNAL FIXATION LEG;  Surgeon: Wylene Simmer, MD;  Location: [place];  Service: Orthopedics;  Laterality: Left;  . EXTERNAL FIXATION REMOVAL Left 04/14/2012   Procedure: REMOVAL EXTERNAL FIXATION LEG;  Surgeon: Wylene Simmer, MD;  Location: [place];  Service: Orthopedics;  Laterality: Left;  . IRRIGATION AND DEBRIDEMENT KNEE Left 04/03/2012   Procedure: IRRIGATION AND DEBRIDEMENT KNEE;  Surgeon: Wylene Simmer, MD;  Location: [place];  Service: Orthopedics;  Laterality: Left;  . LEFT HEART CATHETERIZATION WITH CORONARY ANGIOGRAM N/A 04/05/2013   Procedure: LEFT HEART CATHETERIZATION WITH CORONARY ANGIOGRAM;  Surgeon: [name], MD;  Location: [facility] CATH LAB;  Service: Cardiovascular;  Laterality: N/A;  . MASS EXCISION N/A 07/22/2015   Procedure: EXCISION  PENILE MASS;  Surgeon: Hollice Espy, MD;  Location: [facility] ORS;  Service: Urology;  Laterality: N/A;  . ORIF TIBIA PLATEAU Left 04/14/2012   Procedure: OPEN REDUCTION INTERNAL FIXATION (ORIF) TIBIAL PLATEAU;  Surgeon: Wylene Simmer, MD;  Location: [place];  Service: Orthopedics;  Laterality: Left;  . PERCUTANEOUS CORONARY ROTOBLATOR INTERVENTION (PCI-R) N/A 04/06/2013   Procedure: PERCUTANEOUS CORONARY ROTOBLATOR INTERVENTION (PCI-R);  Surgeon: [name], MD;  Location: [facility] CATH LAB;  Service: Cardiovascular;  Laterality: N/A;     Current Outpatient Medications  Medication Sig Dispense Refill  . atorvastatin (LIPITOR) 40 MG tablet Take 1 tablet (40 mg total) by mouth daily. 90 tablet 0  . carvedilol (COREG) 12.5 MG tablet TAKE ONE (1) TABLET BY MOUTH TWO TIMES PER DAY WITH A MEAL 180 tablet 3  . Cinnamon 500 MG capsule Take 1,000 mg by mouth 2 (two) times daily with a meal.    . clopidogrel (PLAVIX) 75 MG tablet TAKE ONE TABLET EVERY MORNING WITH BREAKFAST 90 tablet 0  . ENTRESTO 24-26 MG TAKE ONE TABLET (24-26 MG) BY MOUTH TWICE DAILY 90 tablet 3  . furosemide (LASIX) 20 MG tablet TAKE 2 TABLETS BY MOUTH EVERY MORNING AND TAKE 1 TABLET EVERY EVENING 270 tablet 3  . gabapentin (NEURONTIN) 300 MG capsule TAKE 1 CAPSULE BY MOUTH 3 TIMES DAILY 270 capsule 1  . glipiZIDE (GLUCOTROL XL) 10 MG 24 hr tablet TAKE ONE TABLET BY MOUTH EVERY DAY WITH BREAKFAST 90 tablet 2  . [name] 100 MG tablet Take 100 mg by mouth daily.    . meloxicam (MOBIC) 15 MG tablet TAKE 1 TABLET BY MOUTH DAILY 90 tablet 1  . metFORMIN (GLUCOPHAGE-XR) 500 MG 24 hr tablet TAKE TWO TABLETS BY MOUTH TWICE DAILY WITH A MEAL 360 tablet 0  . Multiple Vitamin (MULTIVITAMIN WITH MINERALS) TABS Take 1 tablet by mouth daily.    . nitroGLYCERIN (NITROSTAT) 0.4 MG SL tablet Place 1 tablet (0.4 mg total) under the tongue every 5 (five) minutes as needed for chest pain. 25 tablet 3  . pantoprazole (PROTONIX) 40 MG tablet TAKE ONE TABLET TWICE  DAILY 180 tablet 0  . quinapril (ACCUPRIL) 20 MG tablet Take 20 mg by mouth daily.    Marland Kitchen spironolactone (ALDACTONE) 25 MG tablet TAKE HALF A TABLET BY MOUTH EVERY DAY 30 tablet 3   No current facility-administered medications for this visit.    Allergies:   Patient has no known allergies.    Social History:  The patient  reports that he quit smoking about 24 years ago. His smoking use included cigarettes. He has a 120.00 pack-year smoking history. He has quit using smokeless tobacco. He reports that he does not drink alcohol or use drugs.   Family History:  The patient's family history includes Breast cancer in his sister; Coronary artery disease in his brother and father; Lung cancer  in his brother; Prostate cancer in his brother; Stroke in his sister.    ROS:  Please see the history of present illness.   Otherwise, review of systems are positive for none.   All other systems are reviewed and negative.    PHYSICAL EXAM: VS:  BP 122/74 (BP Location: Left Arm, Patient Position: Sitting, Cuff Size: Large)   Pulse 66   Ht 5' 7"  (1.702 m)   Wt 270 lb 8 oz (122.7 kg)   SpO2 92%   BMI 42.37 kg/m  , BMI Body mass index is 42.37 kg/m. GEN: Well nourished, well developed, in no acute distress  HEENT: normal  Neck:no JVD, no carotid bruits, or masses Cardiac: RRR; no  rubs, or gallops. . 2/6 systolic ejection murmur in the aortic area which is early peaking .  Mild bilateral leg edema Respiratory:  clear to auscultation bilaterally, normal work of breathing GI: soft, nontender, nondistended, + BS MS: no deformity or atrophy  Skin: warm and dry, no rash Neuro:  Strength and sensation are intact Psych: euthymic mood, full affect   EKG:  EKG is ordered today. The ekg ordered today demonstrates normal sinus rhythm with left bundle branch block.  Recent Labs: 01/26/2019: Hemoglobin 13.1; Platelets 171 04/03/2019: ALT 38; BUN 25; Creatinine, Ser 1.68; Potassium 4.2; Sodium 140    Lipid  Panel    Component Value Date/Time   CHOL 130 04/03/2019 0837   CHOL 103 06/07/2014 0824   TRIG 199.0 (H) 04/03/2019 0837   HDL 45.90 04/03/2019 0837   HDL 38 (L) 06/07/2014 0824   CHOLHDL 3 04/03/2019 0837   VLDL 39.8 04/03/2019 0837   LDLCALC 44 04/03/2019 0837   LDLCALC 59 03/30/2018 0930      Wt Readings from Last 3 Encounters:  06/29/19 270 lb 8 oz (122.7 kg)  04/03/19 267 lb (121.1 kg)  01/26/19 261 lb 12.8 oz (118.8 kg)        ASSESSMENT AND PLAN:  1.  Coronary artery disease involving native coronary arteries without angina:: He is doing reasonably well with no anginal symptoms. Continue medical therapy. He is to stay on Plavix without aspirin due to prior gastric ulcers.  2. Chronic systolic heart failure:  Appears to be euvolemic on current dose of furosemide.  He is on optimal medical therapy including carvedilol, Entresto and spironolactone with stable chronic kidney disease.  Quinapril is listed on his medication list but he is not supposed to be on this and he is not sure if he is taking it.  I asked him to double check when he goes home and he should not be on this medication.  3. Essential hypertension: Blood pressure is well controlled on current medications.  4. Hyperlipidemia: Continue treatment with atorvastatin. LDL has been consistently below 70.  Most recently it was 57  5.  Type 2 diabetes.  Diabetes control has improved.  6.  Obesity: I discussed with him the importance of regular exercise and weight loss.  He reports limitations related to back pain and neuropathy.  He has not been able to lose any weight   Disposition:   FU with me in 6 months  Signed,  [name], MD  06/29/2019 8:49 AM    [place]

## 2019-06-29 NOTE — Patient Instructions (Signed)
Medication Instructions:  Your physician recommends that you continue on your current medications as directed. Please refer to the Current Medication list given to you today.  DO NOT take Quinapril. When you get home check your medication bottles if you have Quinapril please discard.   *If you need a refill on your cardiac medications before your next appointment, please call your pharmacy*   Lab Work: None ordered If you have labs (blood work) drawn today and your tests are completely normal, you will receive your results only by: Marland Kitchen MyChart Message (if you have MyChart) OR . A paper copy in the mail If you have any lab test that is abnormal or we need to change your treatment, we will call you to review the results.   Testing/Procedures: None ordered   Follow-Up: At [facility], you and your health needs are our priority.  As part of our continuing mission to provide you with exceptional heart care, we have created designated Provider Care Teams.  These Care Teams include your primary Cardiologist (physician) and Advanced Practice Providers (APPs -  Physician Assistants and Nurse Practitioners) who all work together to provide you with the care you need, when you need it.  We recommend signing up for the patient portal called "MyChart".  Sign up information is provided on this After Visit Summary.  MyChart is used to connect with patients for Virtual Visits (Telemedicine).  Patients are able to view lab/test results, encounter notes, upcoming appointments, etc.  Non-urgent messages can be sent to your provider as well.   To learn more about what you can do with MyChart, go to [email or web address].    Your next appointment:   6 month(s)  The format for your next appointment:   In Person  Provider:    You may see [name], MD or one of the following Advanced Practice Providers on your designated Care Team:    Murray Hodgkins, NP  Christell Faith, PA-C  Marrianne Mood, PA-C    Other Instructions N/A

## 2019-07-03 ENCOUNTER — Other Ambulatory Visit: Payer: Self-pay

## 2019-07-03 ENCOUNTER — Ambulatory Visit (INDEPENDENT_AMBULATORY_CARE_PROVIDER_SITE_OTHER): Payer: PPO | Admitting: Family Medicine

## 2019-07-03 ENCOUNTER — Encounter: Payer: Self-pay | Admitting: Family Medicine

## 2019-07-03 DIAGNOSIS — I10 Essential (primary) hypertension: Secondary | ICD-10-CM | POA: Diagnosis not present

## 2019-07-03 DIAGNOSIS — C609 Malignant neoplasm of penis, unspecified: Secondary | ICD-10-CM

## 2019-07-03 DIAGNOSIS — I5022 Chronic systolic (congestive) heart failure: Secondary | ICD-10-CM

## 2019-07-03 DIAGNOSIS — E1142 Type 2 diabetes mellitus with diabetic polyneuropathy: Secondary | ICD-10-CM | POA: Diagnosis not present

## 2019-07-03 NOTE — Patient Instructions (Signed)
Nice to see you. Please continue to monitor your sugars and blood pressure.

## 2019-07-03 NOTE — Assessment & Plan Note (Signed)
Adequate control. Appears euvolemic. Continue current regimen.

## 2019-07-03 NOTE — Assessment & Plan Note (Signed)
Well-controlled.  Continue current regimen. 

## 2019-07-03 NOTE — Progress Notes (Signed)
  [name], MD Phone: [phone]  Quinterrius Errington Lothrop is a 69 y.o. male who presents today for f/u.  HYPERTENSION/CHF Disease Monitoring  Home BP Monitoring 130/70 Chest pain- no    Dyspnea- chronic and stable with hot weather Medications  Compliance-  Taking entresto, coreg, plavix, spironolactone, lasix.  Edema- during the day, though resolves over night No orthopnea or PND.  DIABETES Disease Monitoring: Blood Sugar ranges-130-140 Polyuria/phagia/dipsia- no      Optho- possibly due Medications: Compliance- taking glipizide, januvia, metformin Hypoglycemic symptoms- rarely gets this, he will get dizzy and eat a cookie and feel better.  PUD: taking protonix. No Abdominal pain or reflux. No blood in his stool.   Social History   Tobacco Use  Smoking Status Former Smoker  . Packs/day: 3.00  . Years: 40.00  . Pack years: 120.00  . Types: Cigarettes  . Quit date: 04/05/1995  . Years since quitting: 24.2  Smokeless Tobacco Former [place]   started smoking at age 33     ROS see history of present illness  Objective  Physical Exam Vitals:   07/03/19 0828  BP: 130/70  Pulse: 75  Temp: 97.8 F (36.6 C)  SpO2: 99%    BP Readings from Last 3 Encounters:  07/03/19 130/70  06/29/19 122/74  04/03/19 120/80   Wt Readings from Last 3 Encounters:  07/03/19 265 lb (120.2 kg)  06/29/19 270 lb 8 oz (122.7 kg)  04/03/19 267 lb (121.1 kg)    Physical Exam Constitutional:      General: He is not in acute distress.    Appearance: He is not diaphoretic.  Cardiovascular:     Rate and Rhythm: Normal rate and regular rhythm.     Heart sounds: Normal heart sounds.  Pulmonary:     Effort: Pulmonary effort is normal.     Breath sounds: Normal breath sounds.  Abdominal:     General: Bowel sounds are normal. There is no distension.     Palpations: Abdomen is soft.     Tenderness: There is no abdominal tenderness. There is no guarding or rebound.    Musculoskeletal:     Right lower leg: No edema.     Left lower leg: No edema.  Skin:    General: Skin is warm and dry.  Neurological:     Mental Status: He is alert.      Assessment/Plan: Please see individual problem list.  HTN (hypertension) Adequate control. Continue current regimen.   Chronic systolic heart failure (HCC) Adequate control. Appears euvolemic. Continue current regimen.   DM type 2 (diabetes mellitus, type 2) ([place]) Well controlled. Continue current regimen.   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 He noted no recurrence. I advised that if her develops any lesions he needs to let us know.    No orders of the defined types were placed in this encounter.   No orders of the defined types were placed in this encounter.   This visit occurred during the SARS-CoV-2 public health emergency.  Safety protocols were in place, including screening questions prior to the visit, additional usage of staff PPE, and extensive cleaning of exam room while observing appropriate contact time as indicated for disinfecting solutions.    [name], MD [place]

## 2019-07-03 NOTE — Assessment & Plan Note (Signed)
Discussed performing an exam though the patient declined this. He noted no recurrence. I advised that if her develops any lesions he needs to let us know.

## 2019-07-03 NOTE — Assessment & Plan Note (Signed)
Adequate control. Continue current regimen.

## 2019-07-25 ENCOUNTER — Telehealth: Payer: Self-pay | Admitting: *Deleted

## 2019-07-25 NOTE — Telephone Encounter (Signed)
Daughter [name] called asking what his appts were for. I explained to her that he had lab appointment then an appointment to see physician. She states that it would be a waste of time and is asking if this could be a telephone visit with her and not the patient as he will not remember anything you tell him. She is asking for the lab appointment to moved to the morning of his MRI as well instead of Monday. Please return her call at [phone]

## 2019-07-26 NOTE — Telephone Encounter (Signed)
Mychart visit arranged per Daughter's request. Daughter aware.

## 2019-07-28 ENCOUNTER — Inpatient Hospital Stay: Payer: PPO | Attending: Internal Medicine

## 2019-07-28 ENCOUNTER — Other Ambulatory Visit: Payer: Self-pay

## 2019-07-28 ENCOUNTER — Ambulatory Visit
Admission: RE | Admit: 2019-07-28 | Discharge: 2019-07-28 | Disposition: A | Discharge status: Home / Self Care | Payer: PPO | Source: Ambulatory Visit | Attending: Internal Medicine | Admitting: Internal Medicine

## 2019-07-28 DIAGNOSIS — K769 Liver disease, unspecified: Secondary | ICD-10-CM | POA: Insufficient documentation

## 2019-07-28 DIAGNOSIS — K8020 Calculus of gallbladder without cholecystitis without obstruction: Secondary | ICD-10-CM | POA: Diagnosis not present

## 2019-07-28 DIAGNOSIS — K7460 Unspecified cirrhosis of liver: Secondary | ICD-10-CM | POA: Diagnosis not present

## 2019-07-28 LAB — CBC WITH DIFFERENTIAL/PLATELET
Abs Immature Granulocytes: 0.03 10*3/uL (ref 0.00–0.07)
Basophils Absolute: 0 10*3/uL (ref 0.0–0.1)
Basophils Relative: 1 %
Eosinophils Absolute: 0.2 10*3/uL (ref 0.0–0.5)
Eosinophils Relative: 2 %
HCT: 36.1 % — ABNORMAL LOW (ref 39.0–52.0)
Hemoglobin: 12.2 g/dL — ABNORMAL LOW (ref 13.0–17.0)
Immature Granulocytes: 0 %
Lymphocytes Relative: 22 %
Lymphs Abs: 1.5 10*3/uL (ref 0.7–4.0)
MCH: 32 pg (ref 26.0–34.0)
MCHC: 33.8 g/dL (ref 30.0–36.0)
MCV: 94.8 fL (ref 80.0–100.0)
Monocytes Absolute: 0.6 10*3/uL (ref 0.1–1.0)
Monocytes Relative: 9 %
Neutro Abs: 4.5 10*3/uL (ref 1.7–7.7)
Neutrophils Relative %: 66 %
Platelets: 172 10*3/uL (ref 150–400)
RBC: 3.81 MIL/uL — ABNORMAL LOW (ref 4.22–5.81)
RDW: 13.5 % (ref 11.5–15.5)
WBC: 6.8 10*3/uL (ref 4.0–10.5)
nRBC: 0 % (ref 0.0–0.2)

## 2019-07-28 LAB — COMPREHENSIVE METABOLIC PANEL
ALT: 39 U/L (ref 0–44)
AST: 45 U/L — ABNORMAL HIGH (ref 15–41)
Albumin: 3.4 g/dL — ABNORMAL LOW (ref 3.5–5.0)
Alkaline Phosphatase: 80 U/L (ref 38–126)
Anion gap: 11 (ref 5–15)
BUN: 20 mg/dL (ref 8–23)
CO2: 25 mmol/L (ref 22–32)
Calcium: 8.7 mg/dL — ABNORMAL LOW (ref 8.9–10.3)
Chloride: 103 mmol/L (ref 98–111)
Creatinine, Ser: 1.65 mg/dL — ABNORMAL HIGH (ref 0.61–1.24)
GFR calc Af Amer: 48 mL/min — ABNORMAL LOW (ref >60)
GFR calc non Af Amer: 41 mL/min — ABNORMAL LOW (ref >60)
Glucose, Bld: 111 mg/dL — ABNORMAL HIGH (ref 70–99)
Potassium: 4.7 mmol/L (ref 3.5–5.1)
Sodium: 139 mmol/L (ref 135–145)
Total Bilirubin: 1 mg/dL (ref 0.3–1.2)
Total Protein: 6.8 g/dL (ref 6.5–8.1)

## 2019-07-28 MED ORDER — GADOBUTROL 1 MMOL/ML IV SOLN
10.0000 mL | Freq: Once | INTRAVENOUS | Status: AC | PRN
  Administered 2019-07-28: 10 mL via INTRAVENOUS

## 2019-07-29 LAB — AFP TUMOR MARKER: AFP, Serum, Tumor Marker: 5 ng/mL (ref 0.0–8.3)

## 2019-07-30 ENCOUNTER — Encounter (HOSPITAL_COMMUNITY): Payer: Self-pay | Admitting: Emergency Medicine

## 2019-07-30 ENCOUNTER — Other Ambulatory Visit: Payer: Self-pay

## 2019-07-30 ENCOUNTER — Emergency Department (HOSPITAL_COMMUNITY): Payer: PPO

## 2019-07-30 ENCOUNTER — Inpatient Hospital Stay (HOSPITAL_COMMUNITY)
Admission: EM | Admit: 2019-07-30 | Discharge: 2019-08-01 | DRG: 291 | Disposition: A | Discharge status: Home / Self Care | Payer: PPO | Attending: Internal Medicine | Admitting: Internal Medicine

## 2019-07-30 DIAGNOSIS — I454 Nonspecific intraventricular block: Secondary | ICD-10-CM | POA: Diagnosis not present

## 2019-07-30 DIAGNOSIS — E1122 Type 2 diabetes mellitus with diabetic chronic kidney disease: Secondary | ICD-10-CM | POA: Diagnosis not present

## 2019-07-30 DIAGNOSIS — Z801 Family history of malignant neoplasm of trachea, bronchus and lung: Secondary | ICD-10-CM

## 2019-07-30 DIAGNOSIS — Z6841 Body Mass Index (BMI) 40.0 and over, adult: Secondary | ICD-10-CM | POA: Diagnosis not present

## 2019-07-30 DIAGNOSIS — Z20822 Contact with and (suspected) exposure to covid-19: Secondary | ICD-10-CM | POA: Diagnosis not present

## 2019-07-30 DIAGNOSIS — I509 Heart failure, unspecified: Secondary | ICD-10-CM

## 2019-07-30 DIAGNOSIS — I130 Hypertensive heart and chronic kidney disease with heart failure and stage 1 through stage 4 chronic kidney disease, or unspecified chronic kidney disease: Secondary | ICD-10-CM | POA: Diagnosis not present

## 2019-07-30 DIAGNOSIS — I447 Left bundle-branch block, unspecified: Secondary | ICD-10-CM | POA: Diagnosis not present

## 2019-07-30 DIAGNOSIS — J9601 Acute respiratory failure with hypoxia: Secondary | ICD-10-CM | POA: Diagnosis not present

## 2019-07-30 DIAGNOSIS — Z87891 Personal history of nicotine dependence: Secondary | ICD-10-CM | POA: Diagnosis not present

## 2019-07-30 DIAGNOSIS — Z955 Presence of coronary angioplasty implant and graft: Secondary | ICD-10-CM

## 2019-07-30 DIAGNOSIS — Z803 Family history of malignant neoplasm of breast: Secondary | ICD-10-CM

## 2019-07-30 DIAGNOSIS — I5023 Acute on chronic systolic (congestive) heart failure: Secondary | ICD-10-CM | POA: Diagnosis present

## 2019-07-30 DIAGNOSIS — I959 Hypotension, unspecified: Secondary | ICD-10-CM | POA: Diagnosis present

## 2019-07-30 DIAGNOSIS — Z8249 Family history of ischemic heart disease and other diseases of the circulatory system: Secondary | ICD-10-CM | POA: Diagnosis not present

## 2019-07-30 DIAGNOSIS — N1832 Chronic kidney disease, stage 3b: Secondary | ICD-10-CM | POA: Diagnosis present

## 2019-07-30 DIAGNOSIS — E875 Hyperkalemia: Secondary | ICD-10-CM | POA: Diagnosis present

## 2019-07-30 DIAGNOSIS — I110 Hypertensive heart disease with heart failure: Secondary | ICD-10-CM | POA: Diagnosis not present

## 2019-07-30 DIAGNOSIS — Z7902 Long term (current) use of antithrombotics/antiplatelets: Secondary | ICD-10-CM | POA: Diagnosis not present

## 2019-07-30 DIAGNOSIS — Z8711 Personal history of peptic ulcer disease: Secondary | ICD-10-CM

## 2019-07-30 DIAGNOSIS — I5043 Acute on chronic combined systolic (congestive) and diastolic (congestive) heart failure: Secondary | ICD-10-CM | POA: Diagnosis not present

## 2019-07-30 DIAGNOSIS — E1142 Type 2 diabetes mellitus with diabetic polyneuropathy: Secondary | ICD-10-CM | POA: Diagnosis present

## 2019-07-30 DIAGNOSIS — K219 Gastro-esophageal reflux disease without esophagitis: Secondary | ICD-10-CM | POA: Diagnosis present

## 2019-07-30 DIAGNOSIS — M5136 Other intervertebral disc degeneration, lumbar region: Secondary | ICD-10-CM | POA: Diagnosis not present

## 2019-07-30 DIAGNOSIS — N183 Chronic kidney disease, stage 3 unspecified: Secondary | ICD-10-CM | POA: Diagnosis present

## 2019-07-30 DIAGNOSIS — N189 Chronic kidney disease, unspecified: Secondary | ICD-10-CM

## 2019-07-30 DIAGNOSIS — E119 Type 2 diabetes mellitus without complications: Secondary | ICD-10-CM

## 2019-07-30 DIAGNOSIS — I255 Ischemic cardiomyopathy: Secondary | ICD-10-CM | POA: Diagnosis present

## 2019-07-30 DIAGNOSIS — Z8042 Family history of malignant neoplasm of prostate: Secondary | ICD-10-CM

## 2019-07-30 DIAGNOSIS — I252 Old myocardial infarction: Secondary | ICD-10-CM

## 2019-07-30 DIAGNOSIS — Z7984 Long term (current) use of oral hypoglycemic drugs: Secondary | ICD-10-CM

## 2019-07-30 DIAGNOSIS — I2510 Atherosclerotic heart disease of native coronary artery without angina pectoris: Secondary | ICD-10-CM | POA: Diagnosis present

## 2019-07-30 DIAGNOSIS — R0602 Shortness of breath: Secondary | ICD-10-CM | POA: Diagnosis not present

## 2019-07-30 DIAGNOSIS — E785 Hyperlipidemia, unspecified: Secondary | ICD-10-CM | POA: Diagnosis present

## 2019-07-30 DIAGNOSIS — J811 Chronic pulmonary edema: Secondary | ICD-10-CM | POA: Diagnosis not present

## 2019-07-30 DIAGNOSIS — R0600 Dyspnea, unspecified: Secondary | ICD-10-CM | POA: Diagnosis not present

## 2019-07-30 DIAGNOSIS — Z9981 Dependence on supplemental oxygen: Secondary | ICD-10-CM

## 2019-07-30 DIAGNOSIS — N179 Acute kidney failure, unspecified: Secondary | ICD-10-CM

## 2019-07-30 DIAGNOSIS — Z823 Family history of stroke: Secondary | ICD-10-CM

## 2019-07-30 DIAGNOSIS — I10 Essential (primary) hypertension: Secondary | ICD-10-CM | POA: Diagnosis present

## 2019-07-30 DIAGNOSIS — I5021 Acute systolic (congestive) heart failure: Secondary | ICD-10-CM | POA: Diagnosis not present

## 2019-07-30 DIAGNOSIS — N1831 Chronic kidney disease, stage 3a: Secondary | ICD-10-CM | POA: Diagnosis not present

## 2019-07-30 DIAGNOSIS — Z79899 Other long term (current) drug therapy: Secondary | ICD-10-CM

## 2019-07-30 DIAGNOSIS — E1165 Type 2 diabetes mellitus with hyperglycemia: Secondary | ICD-10-CM | POA: Diagnosis not present

## 2019-07-30 DIAGNOSIS — K769 Liver disease, unspecified: Secondary | ICD-10-CM | POA: Diagnosis present

## 2019-07-30 DIAGNOSIS — J80 Acute respiratory distress syndrome: Secondary | ICD-10-CM | POA: Diagnosis not present

## 2019-07-30 DIAGNOSIS — I517 Cardiomegaly: Secondary | ICD-10-CM | POA: Diagnosis not present

## 2019-07-30 HISTORY — DX: Essential (primary) hypertension: I10

## 2019-07-30 HISTORY — DX: Hepatomegaly, not elsewhere classified: R16.0

## 2019-07-30 HISTORY — DX: Heart failure, unspecified: I50.9

## 2019-07-30 LAB — BRAIN NATRIURETIC PEPTIDE: B Natriuretic Peptide: 387.5 pg/mL — ABNORMAL HIGH (ref 0.0–100.0)

## 2019-07-30 LAB — CBC WITH DIFFERENTIAL/PLATELET
Abs Immature Granulocytes: 0.03 10*3/uL (ref 0.00–0.07)
Basophils Absolute: 0.1 10*3/uL (ref 0.0–0.1)
Basophils Relative: 1 %
Eosinophils Absolute: 0.1 10*3/uL (ref 0.0–0.5)
Eosinophils Relative: 2 %
HCT: 43 % (ref 39.0–52.0)
Hemoglobin: 14 g/dL (ref 13.0–17.0)
Immature Granulocytes: 0 %
Lymphocytes Relative: 14 %
Lymphs Abs: 1 10*3/uL (ref 0.7–4.0)
MCH: 32.3 pg (ref 26.0–34.0)
MCHC: 32.6 g/dL (ref 30.0–36.0)
MCV: 99.3 fL (ref 80.0–100.0)
Monocytes Absolute: 0.5 10*3/uL (ref 0.1–1.0)
Monocytes Relative: 7 %
Neutro Abs: 5.5 10*3/uL (ref 1.7–7.7)
Neutrophils Relative %: 76 %
Platelets: 195 10*3/uL (ref 150–400)
RBC: 4.33 MIL/uL (ref 4.22–5.81)
RDW: 13.4 % (ref 11.5–15.5)
WBC: 7.2 10*3/uL (ref 4.0–10.5)
nRBC: 0 % (ref 0.0–0.2)

## 2019-07-30 LAB — I-STAT VENOUS BLOOD GAS, ED
Acid-Base Excess: 0 mmol/L (ref 0.0–2.0)
Bicarbonate: 25.2 mmol/L (ref 20.0–28.0)
Calcium, Ion: 1.05 mmol/L — ABNORMAL LOW (ref 1.15–1.40)
HCT: 41 % (ref 39.0–52.0)
Hemoglobin: 13.9 g/dL (ref 13.0–17.0)
O2 Saturation: 72 %
Potassium: 5.5 mmol/L — ABNORMAL HIGH (ref 3.5–5.1)
Sodium: 137 mmol/L (ref 135–145)
TCO2: 27 mmol/L (ref 22–32)
pCO2, Ven: 43.4 mmHg — ABNORMAL LOW (ref 44.0–60.0)
pH, Ven: 7.373 (ref 7.250–7.430)
pO2, Ven: 39 mmHg (ref 32.0–45.0)

## 2019-07-30 LAB — COMPREHENSIVE METABOLIC PANEL
ALT: 36 U/L (ref 0–44)
AST: 60 U/L — ABNORMAL HIGH (ref 15–41)
Albumin: 3.3 g/dL — ABNORMAL LOW (ref 3.5–5.0)
Alkaline Phosphatase: 86 U/L (ref 38–126)
Anion gap: 12 (ref 5–15)
BUN: 21 mg/dL (ref 8–23)
CO2: 21 mmol/L — ABNORMAL LOW (ref 22–32)
Calcium: 8.8 mg/dL — ABNORMAL LOW (ref 8.9–10.3)
Chloride: 103 mmol/L (ref 98–111)
Creatinine, Ser: 1.81 mg/dL — ABNORMAL HIGH (ref 0.61–1.24)
GFR calc Af Amer: 43 mL/min — ABNORMAL LOW (ref >60)
GFR calc non Af Amer: 37 mL/min — ABNORMAL LOW (ref >60)
Glucose, Bld: 268 mg/dL — ABNORMAL HIGH (ref 70–99)
Potassium: 6.1 mmol/L — ABNORMAL HIGH (ref 3.5–5.1)
Sodium: 136 mmol/L (ref 135–145)
Total Bilirubin: 1.7 mg/dL — ABNORMAL HIGH (ref 0.3–1.2)
Total Protein: 6.7 g/dL (ref 6.5–8.1)

## 2019-07-30 LAB — SARS CORONAVIRUS 2 BY RT PCR (HOSPITAL ORDER, PERFORMED IN ~~LOC~~ HOSPITAL LAB): SARS Coronavirus 2: NEGATIVE

## 2019-07-30 LAB — GLUCOSE, CAPILLARY
Glucose-Capillary: 90 mg/dL (ref 70–99)
Glucose-Capillary: 114 mg/dL — ABNORMAL HIGH (ref 70–99)

## 2019-07-30 LAB — TROPONIN I (HIGH SENSITIVITY): Troponin I (High Sensitivity): 16 ng/L (ref <18)

## 2019-07-30 MED ORDER — PANTOPRAZOLE SODIUM 40 MG PO TBEC
40.0000 mg | DELAYED_RELEASE_TABLET | Freq: Two times a day (BID) | ORAL | Status: DC
  Administered 2019-07-30 – 2019-08-01 (×5): 40 mg via ORAL
  Filled 2019-07-30 (×5): qty 1

## 2019-07-30 MED ORDER — SACUBITRIL-VALSARTAN 24-26 MG PO TABS
1.0000 | ORAL_TABLET | Freq: Two times a day (BID) | ORAL | Status: DC
  Administered 2019-07-30: 1 via ORAL
  Filled 2019-07-30 (×2): qty 1

## 2019-07-30 MED ORDER — ATORVASTATIN CALCIUM 40 MG PO TABS
40.0000 mg | ORAL_TABLET | Freq: Every day | ORAL | Status: DC
  Administered 2019-07-30 – 2019-08-01 (×3): 40 mg via ORAL
  Filled 2019-07-30 (×3): qty 1

## 2019-07-30 MED ORDER — ENOXAPARIN SODIUM 60 MG/0.6ML ~~LOC~~ SOLN
60.0000 mg | SUBCUTANEOUS | Status: DC
  Administered 2019-07-30 – 2019-07-31 (×2): 60 mg via SUBCUTANEOUS
  Filled 2019-07-30 (×2): qty 0.6

## 2019-07-30 MED ORDER — GABAPENTIN 300 MG PO CAPS
300.0000 mg | ORAL_CAPSULE | Freq: Three times a day (TID) | ORAL | Status: DC
  Administered 2019-07-30 – 2019-08-01 (×6): 300 mg via ORAL
  Filled 2019-07-30 (×6): qty 1

## 2019-07-30 MED ORDER — SODIUM CHLORIDE 0.9% FLUSH
3.0000 mL | Freq: Two times a day (BID) | INTRAVENOUS | Status: DC
  Administered 2019-07-30 – 2019-08-01 (×5): 3 mL via INTRAVENOUS

## 2019-07-30 MED ORDER — CLOPIDOGREL BISULFATE 75 MG PO TABS
75.0000 mg | ORAL_TABLET | Freq: Every day | ORAL | Status: DC
  Administered 2019-07-30 – 2019-08-01 (×3): 75 mg via ORAL
  Filled 2019-07-30 (×3): qty 1

## 2019-07-30 MED ORDER — INSULIN ASPART 100 UNIT/ML ~~LOC~~ SOLN: 0.0000 [IU] | Freq: Every day | SUBCUTANEOUS | Status: DC

## 2019-07-30 MED ORDER — SODIUM CHLORIDE 0.9 % IV SOLN: 250.0000 mL | INTRAVENOUS | Status: DC | PRN

## 2019-07-30 MED ORDER — INSULIN ASPART 100 UNIT/ML ~~LOC~~ SOLN
0.0000 [IU] | Freq: Three times a day (TID) | SUBCUTANEOUS | Status: DC
  Administered 2019-07-31 (×2): 3 [IU] via SUBCUTANEOUS
  Administered 2019-08-01: 8 [IU] via SUBCUTANEOUS

## 2019-07-30 MED ORDER — NITROGLYCERIN 2 % TD OINT
1.0000 [in_us] | TOPICAL_OINTMENT | Freq: Once | TRANSDERMAL | Status: AC
  Administered 2019-07-30: 1 [in_us] via TOPICAL
  Filled 2019-07-30: qty 1

## 2019-07-30 MED ORDER — ASPIRIN EC 81 MG PO TBEC: 81.0000 mg | DELAYED_RELEASE_TABLET | Freq: Every day | ORAL | Status: DC

## 2019-07-30 MED ORDER — FUROSEMIDE 10 MG/ML IJ SOLN
40.0000 mg | Freq: Two times a day (BID) | INTRAMUSCULAR | Status: DC
  Administered 2019-07-30 – 2019-07-31 (×2): 40 mg via INTRAVENOUS
  Filled 2019-07-30 (×2): qty 4

## 2019-07-30 MED ORDER — SODIUM CHLORIDE 0.9% FLUSH: 3.0000 mL | INTRAVENOUS | Status: DC | PRN

## 2019-07-30 MED ORDER — SPIRONOLACTONE 12.5 MG HALF TABLET
12.5000 mg | ORAL_TABLET | Freq: Every day | ORAL | Status: DC
  Administered 2019-07-30: 12.5 mg via ORAL
  Filled 2019-07-30: qty 1

## 2019-07-30 MED ORDER — FUROSEMIDE 10 MG/ML IJ SOLN
40.0000 mg | INTRAMUSCULAR | Status: AC
  Administered 2019-07-30: 40 mg via INTRAVENOUS
  Filled 2019-07-30: qty 4

## 2019-07-30 NOTE — Progress Notes (Signed)
Patient transferred from ED at 1548hrs.  Denies pain or SOB on arrival.  Oriented to unit and plan of care for shift, patient verbalized understanding.

## 2019-07-30 NOTE — ED Triage Notes (Signed)
Pt form home here via [facility] in resp distress. Pt found to be in the 80s upon ems arrival and they got him to 94% CPAP. EMS administered Nitro and fire gave him 324 aspirin. Pt is 94% on 2L [place] a this time.

## 2019-07-30 NOTE — ED Notes (Signed)
Family at bedside. 
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
## 2019-07-30 NOTE — ED Provider Notes (Signed)
[facility] EMERGENCY DEPARTMENT Provider Note   CSN: [id number] Arrival date & time: 07/30/19  1055     History No chief complaint on file.   Devin Hanna is a 69 y.o. male.  69 year old male with past medical history below including HFrEF, ischemic cardiomyopathy, type 2 diabetes mellitus, CAD, hypertension, hyperlipidemia, morbid obesity who presents with shortness of breath.  Patient was picked up from home by EMS for shortness of breath that he reports started suddenly this morning.  He states that he was not short of breath yesterday.  He received 1 nitroglycerin and 324 mg aspirin prior to arrival.  They placed him on CPAP during transport.  He notes that his legs look more swollen now than they did when he woke up.  He does think that his weight is up about 15 pounds.  He denies any orthopnea.  He states that he has had 1 to 2 years of exertional dyspnea.  He denies any cough/cold symptoms or recent illness.  No chest pain.  He is compliant with his medications and recently saw his cardiologist in the clinic.  The history is provided by the patient and the EMS personnel.       Past Medical History:  Diagnosis Date   Acute respiratory failure with hypoxia ([place]) 06/2016   CAD (coronary artery disease)    a. Remote PCI of the RCA;  b. 03/2013 Inf STEMI/PCI: LAD 60-70d, RCA 99p ISR(rota/CBA/DES);  c. 09/2014 MV: inf and infsept infarct, EF 35-40%;  d. 09/2014 Cath: patent RCA stent, stable LAD dzs, EF 40%-->Med rx.   CHF (congestive heart failure) ([place])    Diabetes mellitus    sees Dr. Hardin Negus in [place]   GERD (gastroesophageal reflux disease)    HFrEF (heart failure with reduced ejection fraction) ([place])    Hyperlipidemia    Hypertension    Ischemic cardiomyopathy    a. 09/2014 EF 40% by V gram;  b. 06/2016 Echo: EF 40, diff HK, Gr1 DD, inf HK, no mural thrombus. Mild MR, mildly dil LA, mildly dil RV.    LBBB (left bundle branch block)    Old    Morbid obesity ([place])    Peripheral neuropathy    Upper GI bleed    Vertigo     Patient Active Problem List   Diagnosis Date Noted   AK (actinic keratosis) 04/03/2019   Colon cancer screening    Rectal polyp    Polyp of transverse colon    Liver lesion 12/06/2018   Chronic kidney disease (CKD), stage III (moderate) 12/06/2018   Abrasion 12/06/2018   History of anemia 03/30/2018   Hyperpigmented skin lesion 12/20/2017   Bilateral foot pain 12/20/2017   DDD (degenerative disc disease), lumbar 08/18/2017   SCC (squamous cell carcinoma), trunk 05/17/2017   Venous insufficiency 02/12/2017   Decreased pedal pulses 02/12/2017   Skin lesion 02/12/2017   Neuropathy 01/09/2016   Elevated LFTs 06/28/2015   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   Peptic ulcer disease 09/12/2014   Erectile dysfunction 62/70/3500   Chronic systolic heart failure ([place]) 05/15/2013   Hyperlipidemia 05/15/2013   DM type 2 (diabetes mellitus, type 2) ([place]) 04/07/2013   Other and unspecified hyperlipidemia 04/07/2013   Morbid obesity ([place]) 04/07/2013   HTN (hypertension) 04/07/2013   CAD (coronary artery disease) 04/04/2013    Past Surgical History:  Procedure Laterality Date   CARDIAC CATHETERIZATION N/A 10/03/2014   Procedure: Left Heart Cath and Coronary Angiography;  Surgeon: [name],  MD;  Location: [place] CV LAB;  Service: Cardiovascular;  Laterality: N/A;   CIRCUMCISION N/A 07/22/2015   Procedure: CIRCUMCISION ADULT;  Surgeon: Hollice Espy, MD;  Location: [facility] ORS;  Service: Urology;  Laterality: N/A;   COLONOSCOPY WITH PROPOFOL N/A 01/12/2019   Procedure: COLONOSCOPY WITH PROPOFOL;  Surgeon: Lucilla Lame, MD;  Location: [facility] ENDOSCOPY;  Service: Endoscopy;  Laterality: N/A;   CORONARY STENT PLACEMENT     ESOPHAGOGASTRODUODENOSCOPY (EGD) WITH PROPOFOL N/A 09/12/2014   Procedure: ESOPHAGOGASTRODUODENOSCOPY (EGD) WITH PROPOFOL;  Surgeon: Ronald Lobo, MD;   Location: [facility] ENDOSCOPY;  Service: Endoscopy;  Laterality: N/A;   EXTERNAL FIXATION LEG Left 04/03/2012   Procedure: EXTERNAL FIXATION LEG;  Surgeon: Wylene Simmer, MD;  Location: [facility];  Service: Orthopedics;  Laterality: Left;   EXTERNAL FIXATION REMOVAL Left 04/14/2012   Procedure: REMOVAL EXTERNAL FIXATION LEG;  Surgeon: Wylene Simmer, MD;  Location: [place];  Service: Orthopedics;  Laterality: Left;   IRRIGATION AND DEBRIDEMENT KNEE Left 04/03/2012   Procedure: IRRIGATION AND DEBRIDEMENT KNEE;  Surgeon: Wylene Simmer, MD;  Location: [place];  Service: Orthopedics;  Laterality: Left;   LEFT HEART CATHETERIZATION WITH CORONARY ANGIOGRAM N/A 04/05/2013   Procedure: LEFT HEART CATHETERIZATION WITH CORONARY ANGIOGRAM;  Surgeon: [name], MD;  Location: [place] [facility] CATH LAB;  Service: Cardiovascular;  Laterality: N/A;   MASS EXCISION N/A 07/22/2015   Procedure: EXCISION PENILE MASS;  Surgeon: Hollice Espy, MD;  Location: [facility] ORS;  Service: Urology;  Laterality: N/A;   ORIF TIBIA PLATEAU Left 04/14/2012   Procedure: OPEN REDUCTION INTERNAL FIXATION (ORIF) TIBIAL PLATEAU;  Surgeon: Wylene Simmer, MD;  Location: [place];  Service: Orthopedics;  Laterality: Left;   PERCUTANEOUS CORONARY ROTOBLATOR INTERVENTION (PCI-R) N/A 04/06/2013   Procedure: PERCUTANEOUS CORONARY ROTOBLATOR INTERVENTION (PCI-R);  Surgeon: [name], MD;  Location: [facility] CATH LAB;  Service: Cardiovascular;  Laterality: N/A;       Family History  Problem Relation Age of Onset   Coronary artery disease Father    Coronary artery disease Brother    Lung cancer Brother    Breast cancer Sister    Prostate cancer Brother    Stroke Sister     Social History   Tobacco Use   Smoking status: Former Smoker    Packs/day: 3.00    Years: 40.00    Pack years: 120.00    Types: Cigarettes    Quit date: 04/05/1995    Years since quitting: 24.3   Smokeless tobacco: Former Systems developer   Tobacco comment: started smoking at age 10  Vaping Use     Vaping Use: Never used  Substance Use Topics   Alcohol use: No    Comment: Occassional Use   Drug use: No    Home Medications Prior to Admission medications   Medication Sig Start Date End Date Taking? Authorizing Provider  atorvastatin (LIPITOR) 40 MG tablet Take 1 tablet (40 mg total) by mouth daily. 04/19/19  Yes Theora Gianotti, NP  carvedilol (COREG) 12.5 MG tablet TAKE ONE (1) TABLET BY MOUTH TWO TIMES PER DAY WITH A MEAL 05/22/19  Yes Theora Gianotti, NP  Cinnamon 500 MG capsule Take 1,000 mg by mouth 2 (two) times daily with a meal.   Yes [provider]  clopidogrel (PLAVIX) 75 MG tablet TAKE ONE TABLET EVERY MORNING WITH BREAKFAST 04/19/19  Yes [name], MD  ENTRESTO 24-26 MG TAKE ONE TABLET (24-26 MG) BY MOUTH TWICE DAILY 06/21/19  Yes [name], MD  furosemide (LASIX)  20 MG tablet TAKE 2 TABLETS BY MOUTH EVERY MORNING AND TAKE 1 TABLET EVERY EVENING 10/24/18  Yes Gollan, Kathlene November, MD  gabapentin (NEURONTIN) 300 MG capsule TAKE 1 CAPSULE BY MOUTH 3 TIMES DAILY 04/18/19  Yes [name], MD  glipiZIDE (GLUCOTROL XL) 10 MG 24 hr tablet TAKE ONE TABLET BY MOUTH EVERY DAY WITH BREAKFAST 04/18/19  Yes [name], MD  [name] 100 MG tablet Take 100 mg by mouth daily. 11/18/18  Yes [provider]  metFORMIN (GLUCOPHAGE-XR) 500 MG 24 hr tablet TAKE TWO TABLETS BY MOUTH TWICE DAILY WITH A MEAL 05/19/19  Yes [name], MD  Multiple Vitamin (MULTIVITAMIN WITH MINERALS) TABS Take 1 tablet by mouth daily.   Yes [provider]  nitroGLYCERIN (NITROSTAT) 0.4 MG SL tablet Place 1 tablet (0.4 mg total) under the tongue every 5 (five) minutes as needed for chest pain. 10/27/16  Yes [name], MD  pantoprazole (PROTONIX) 40 MG tablet TAKE ONE TABLET TWICE DAILY 04/19/19  Yes [name], MD  spironolactone (ALDACTONE) 25 MG tablet TAKE HALF A TABLET BY MOUTH EVERY DAY 05/19/19  Yes [name], MD   meloxicam (MOBIC) 15 MG tablet TAKE 1 TABLET BY MOUTH DAILY Patient not taking: Reported on 07/30/2019 04/19/19   Edrick Kins, DPM    Allergies    Patient has no known allergies.  Review of Systems   Review of Systems All other systems reviewed and are negative except that which was mentioned in HPI  Physical Exam Updated Vital Signs BP 104/68    Pulse 66    Temp 97.7 F (36.5 C) (Oral)    Resp 20    Ht 5' 7"  (1.702 m)    Wt 120.2 kg    SpO2 99%    BMI 41.50 kg/m   Physical Exam Vitals and nursing note reviewed.  Constitutional:      General: He is not in acute distress.    Appearance: He is well-developed. He is obese.  HENT:     Head: Normocephalic and atraumatic.  Eyes:     Conjunctiva/sclera: Conjunctivae normal.     Pupils: Pupils are equal, round, and reactive to light.  Cardiovascular:     Rate and Rhythm: Normal rate and regular rhythm.     Heart sounds: Normal heart sounds. No murmur heard.   Pulmonary:     Comments: Increased WOB with tachypnea but able to speak in full sentences, crackles with diminished BS b/l Abdominal:     General: There is distension (mild).     Palpations: Abdomen is soft.     Tenderness: There is no abdominal tenderness.  Musculoskeletal:     Cervical back: Neck supple.     Right lower leg: Edema present.     Left lower leg: Edema present.     Comments: 2-3+ pitting edema BLE  Skin:    General: Skin is warm and dry.  Neurological:     Mental Status: He is alert and oriented to person, place, and time.     Comments: Fluent speech  Psychiatric:        Mood and Affect: Mood normal.        Judgment: Judgment normal.     ED Results / Procedures / Treatments   Labs (all labs ordered are listed, but only abnormal results are displayed) Labs Reviewed  COMPREHENSIVE METABOLIC PANEL - Abnormal; Notable for the following components:      Result Value  Potassium 6.1 (*)    CO2 21 (*)    Glucose, Bld 268 (*)    Creatinine, Ser  1.81 (*)    Calcium 8.8 (*)    Albumin 3.3 (*)    AST 60 (*)    Total Bilirubin 1.7 (*)    GFR calc non Af Amer 37 (*)    GFR calc Af Amer 43 (*)    All other components within normal limits  BRAIN NATRIURETIC PEPTIDE - Abnormal; Notable for the following components:   B Natriuretic Peptide 387.5 (*)    All other components within normal limits  I-STAT VENOUS BLOOD GAS, ED - Abnormal; Notable for the following components:   pCO2, Ven 43.4 (*)    Potassium 5.5 (*)    Calcium, Ion 1.05 (*)    All other components within normal limits  CBC WITH DIFFERENTIAL/PLATELET  TROPONIN I (HIGH SENSITIVITY)    EKG None  Radiology DG Chest Portable 1 View  Result Date: 07/30/2019 CLINICAL DATA:  Shortness of breath, CHF EXAM: PORTABLE CHEST 1 VIEW COMPARISON:  06/20/2016, 02/14/2019 FINDINGS: Cardiomegaly. Pulmonary vascular congestion with diffusely increased interstitial markings. There are slightly more confluent airspace opacity within the bilateral lung bases, right greater than left. No large pleural fluid collection. No pneumothorax. IMPRESSION: Cardiomegaly with pulmonary vascular congestion and interstitial edema. More confluent bibasilar airspace opacities may reflect a component of alveolar edema versus superimposed infection. Electronically Signed   By: Davina Poke D.O.   On: 07/30/2019 11:59    Procedures Procedures (including critical care time)  Medications Ordered in ED Medications  furosemide (LASIX) injection 40 mg (40 mg Intravenous Given 07/30/19 1133)  nitroGLYCERIN (NITROGLYN) 2 % ointment 1 inch (1 inch Topical Given 07/30/19 1134)    ED Course  I have reviewed the triage vital signs and the nursing notes.  Pertinent labs & imaging results that were available during my care of the patient were reviewed by me and considered in my medical decision making (see chart for details).    MDM Rules/Calculators/A&P                          Pt switched from CPAP to 3L   on arrival, tachypneic but able to speak in full sentences and mentating normally.  Vital signs stable.  Exam consistent with volume overload and no history of COPD or asthma therefore suspect CHF exacerbation or flash pulmonary edema.  Gave IV Lasix, Nitropaste.  VBG reassuring.  CMP shows potassium 6.1 however hemolyzed sample and no hyperkalemic changes on EKG.  Creatinine 1.8 which is slightly higher than baseline CKD.  Troponin negative.  BNP 387.  Chest x-ray shows vascular congestion and interstitial edema consistent with heart failure exacerbation.  Gave 40 mg IV Lasix.  He continues to be on 1 L supplemental O2.  Discussed admission with [facility] hospitalist, Dr. Lorin Mercy, who will admit the patient for further diuresis. Final Clinical Impression(s) / ED Diagnoses Final diagnoses:  Acute on chronic congestive heart failure, unspecified heart failure type [facility])    Rx / DC Orders ED Discharge Orders    None       Nettleton, Wenda Overland, MD 07/30/19 1305
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
## 2019-07-31 ENCOUNTER — Inpatient Hospital Stay (HOSPITAL_COMMUNITY): Payer: PPO

## 2019-07-31 ENCOUNTER — Telehealth: Payer: Self-pay | Admitting: Internal Medicine

## 2019-07-31 ENCOUNTER — Inpatient Hospital Stay: Payer: PPO

## 2019-07-31 ENCOUNTER — Inpatient Hospital Stay: Payer: PPO | Admitting: Internal Medicine

## 2019-07-31 DIAGNOSIS — I10 Essential (primary) hypertension: Secondary | ICD-10-CM

## 2019-07-31 DIAGNOSIS — K769 Liver disease, unspecified: Secondary | ICD-10-CM

## 2019-07-31 DIAGNOSIS — I2510 Atherosclerotic heart disease of native coronary artery without angina pectoris: Secondary | ICD-10-CM

## 2019-07-31 DIAGNOSIS — I5021 Acute systolic (congestive) heart failure: Secondary | ICD-10-CM

## 2019-07-31 DIAGNOSIS — I5043 Acute on chronic combined systolic (congestive) and diastolic (congestive) heart failure: Secondary | ICD-10-CM

## 2019-07-31 DIAGNOSIS — N1831 Chronic kidney disease, stage 3a: Secondary | ICD-10-CM

## 2019-07-31 LAB — CBC WITH DIFFERENTIAL/PLATELET
Abs Immature Granulocytes: 0.03 10*3/uL (ref 0.00–0.07)
Basophils Absolute: 0.1 10*3/uL (ref 0.0–0.1)
Basophils Relative: 1 %
Eosinophils Absolute: 0.1 10*3/uL (ref 0.0–0.5)
Eosinophils Relative: 2 %
HCT: 39.2 % (ref 39.0–52.0)
Hemoglobin: 12.9 g/dL — ABNORMAL LOW (ref 13.0–17.0)
Immature Granulocytes: 0 %
Lymphocytes Relative: 25 %
Lymphs Abs: 2 10*3/uL (ref 0.7–4.0)
MCH: 31.6 pg (ref 26.0–34.0)
MCHC: 32.9 g/dL (ref 30.0–36.0)
MCV: 96.1 fL (ref 80.0–100.0)
Monocytes Absolute: 0.8 10*3/uL (ref 0.1–1.0)
Monocytes Relative: 9 %
Neutro Abs: 5.2 10*3/uL (ref 1.7–7.7)
Neutrophils Relative %: 63 %
Platelets: 170 10*3/uL (ref 150–400)
RBC: 4.08 MIL/uL — ABNORMAL LOW (ref 4.22–5.81)
RDW: 13.6 % (ref 11.5–15.5)
WBC: 8.2 10*3/uL (ref 4.0–10.5)
nRBC: 0 % (ref 0.0–0.2)

## 2019-07-31 LAB — BASIC METABOLIC PANEL
Anion gap: 12 (ref 5–15)
BUN: 24 mg/dL — ABNORMAL HIGH (ref 8–23)
CO2: 24 mmol/L (ref 22–32)
Calcium: 9 mg/dL (ref 8.9–10.3)
Chloride: 103 mmol/L (ref 98–111)
Creatinine, Ser: 1.91 mg/dL — ABNORMAL HIGH (ref 0.61–1.24)
GFR calc Af Amer: 40 mL/min — ABNORMAL LOW (ref >60)
GFR calc non Af Amer: 34 mL/min — ABNORMAL LOW (ref >60)
Glucose, Bld: 106 mg/dL — ABNORMAL HIGH (ref 70–99)
Potassium: 4.7 mmol/L (ref 3.5–5.1)
Sodium: 139 mmol/L (ref 135–145)

## 2019-07-31 LAB — ECHOCARDIOGRAM COMPLETE
Height: 70 in
Weight: 4124.89 oz

## 2019-07-31 LAB — GLUCOSE, CAPILLARY
Glucose-Capillary: 195 mg/dL — ABNORMAL HIGH (ref 70–99)
Glucose-Capillary: 161 mg/dL — ABNORMAL HIGH (ref 70–99)
Glucose-Capillary: 100 mg/dL — ABNORMAL HIGH (ref 70–99)

## 2019-07-31 MED ORDER — FUROSEMIDE 10 MG/ML IJ SOLN
40.0000 mg | Freq: Two times a day (BID) | INTRAMUSCULAR | Status: DC
  Administered 2019-07-31 – 2019-08-01 (×2): 40 mg via INTRAVENOUS
  Filled 2019-07-31 (×2): qty 4

## 2019-07-31 MED ORDER — PERFLUTREN LIPID MICROSPHERE
INTRAVENOUS | Status: AC
  Administered 2019-07-31: 2 mL
  Filled 2019-07-31: qty 10

## 2019-07-31 MED ORDER — PERFLUTREN LIPID MICROSPHERE
1.0000 mL | INTRAVENOUS | Status: AC | PRN
  Filled 2019-07-31: qty 10
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
## 2019-07-31 NOTE — Progress Notes (Signed)
  Echocardiogram 2D Echocardiogram has been performed.  Jennette Dubin 07/31/2019, 3:46 PM

## 2019-07-31 NOTE — Evaluation (Signed)
Physical Therapy Evaluation Patient Details Name: Devin Hanna MRN: [id number] DOB: July 19, 1950 Today's Date: 07/31/2019   History of Present Illness  69 yo male with onset of acute CHF and pulm vasc congestion was admitted, noted cardiomegaly, EF 35-40%, AKI and chronic SOB.  PMHx:  L BBB, CHF, CAD with stents, HTN, acute respiratory failure, liver mass, PN (symptomatic), L knee tibial fracture with hardware  Clinical Impression  Pt was seen for mobility with no AD used previously.  However, he has history of L knee fracture and B PN with numb feet.  Follow acutely to monitor his progression of gait, and will avoid overstressing his system.  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  Will focus on mobility and may add stairs due to the job requirement to climb a short ladder to get in a truck.      Follow Up Recommendations No PT follow up    Equipment Recommendations  None recommended by PT    Recommendations for Other Services       Precautions / Restrictions Precautions Precautions: Fall Precaution Comments: works on heavy machinery 10 hours a day Restrictions Weight Bearing Restrictions: No Other Position/Activity Restrictions: poor foot sensation      Mobility  Bed Mobility Overal bed mobility: Needs Assistance Bed Mobility: Supine to Sit;Sit to Supine     Supine to sit: Supervision Sit to supine: Supervision      Transfers Overall transfer level: Needs assistance Equipment used: None Transfers: Sit to/from Stand Sit to Stand: Supervision         General transfer comment: supervised due to evaluation and safety  Ambulation/Gait Ambulation/Gait assistance: Min guard (for safety) Gait Distance (Feet): 200 Feet Assistive device: 1 person hand held assist Gait Pattern/deviations: Step-through pattern;Decreased stride length;Wide base of support Gait velocity: reduced Gait velocity interpretation:  <1.31 ft/sec, indicative of household ambulator General Gait Details: pt drifts toward hall railing but did not have outright LOB  Stairs            Wheelchair Mobility    Modified Rankin (Stroke Patients Only)       Balance Overall balance assessment: Needs assistance Sitting-balance support: Feet supported Sitting balance-Leahy Scale: Good     Standing balance support: No upper extremity supported Standing balance-Leahy Scale: Fair                               Pertinent Vitals/Pain Pain Assessment: Faces Faces Pain Scale: Hurts a Monfils bit Pain Location: low back Pain Descriptors / Indicators: Sore Pain Intervention(s): Repositioned;Monitored during session    Home Living Family/patient expects to be discharged to:: Private residence Living Arrangements: Alone Available Help at Discharge: Family;Friend(s);Available PRN/intermittently Type of Home: House Home Access: Stairs to enter   Entrance Stairs-Number of Steps: 1 Home Layout: One level Home Equipment: None (had a walker from L knee injury but not sure where it is)      Prior Function Level of Independence: Independent         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 Dominance   Dominant Hand: Right    Extremity/Trunk Assessment   Upper Extremity Assessment Upper Extremity Assessment: Overall WFL for tasks assessed    Lower Extremity Assessment Lower Extremity Assessment: Overall WFL for tasks assessed    Cervical / Trunk Assessment Cervical / Trunk Assessment: Other exceptions (chronic back  pain)  Communication   Communication: No difficulties  Cognition Arousal/Alertness: Awake/alert Behavior During Therapy: WFL for tasks assessed/performed Overall Cognitive Status: Within Functional Limits for tasks assessed                                        General Comments General comments (skin integrity, edema, etc.): pt is demonstrating  tolerance for standing and control of balance wiht gait, but is monitored for vitals with mobility.  O2 sats were stable during gait, no drops lower than 90% with mobility    Exercises     Assessment/Plan    PT Assessment Patient needs continued PT services  PT Problem List Decreased range of motion;Decreased mobility;Cardiopulmonary status limiting activity       PT Treatment Interventions DME instruction;Gait training;Functional mobility training;Therapeutic activities;Therapeutic exercise;Balance training;Neuromuscular re-education;Patient/family education    PT Goals (Current goals can be found in the Care Plan section)  Acute Rehab PT Goals Patient Stated Goal: to get home and back to work PT Goal Formulation: With patient Time For Goal Achievement: 08/14/19 Potential to Achieve Goals: Good    Frequency Min 3X/week   Barriers to discharge Decreased caregiver support home alone but works longer schedule    Co-evaluation               AM-PAC PT "6 Clicks" Mobility  Outcome Measure Help needed turning from your back to your side while in a flat bed without using bedrails?: None Help needed moving from lying on your back to sitting on the side of a flat bed without using bedrails?: A [place] Help needed moving to and from a bed to a chair (including a wheelchair)?: A [place] Help needed standing up from a chair using your arms (e.g., wheelchair or bedside chair)?: A [place] Help needed to walk in hospital room?: A [place] Help needed climbing 3-5 steps with a railing? : A [place] 6 Click Score: 18    End of Session Equipment Utilized During Treatment: Gait belt Activity Tolerance: Patient tolerated treatment well;Treatment limited secondary to medical complications (Comment) Patient left: in bed;with call bell/phone within reach;with bed alarm set Nurse Communication: Mobility status PT Visit Diagnosis: Other abnormalities of gait and mobility (R26.89)    Time:  1100-1120 PT Time Calculation (min) (ACUTE ONLY): 20 min   Charges:   PT Evaluation $PT Eval Moderate Complexity: 1 Mod         Ramond Dial 07/31/2019, 3:03 PM  Mee Hives, PT MS Acute Rehab Dept. Number: [place] and [place]

## 2019-07-31 NOTE — Telephone Encounter (Signed)
Spoke to patient's daughter [name] regarding results of MRI; stable liver lesion AFP normal.  Recommend follow-up in 6 months/MRI labs.  Patient is current admitted to hospital at [facility] with congestive heart failure.  C-please schedule follow-up in 6 months- MD; MRI liver /labs-CBC CMP AFP/1 to 2 days prior  Please call daughter, next week to schedule.

## 2019-07-31 NOTE — Progress Notes (Signed)
ReDs clip reading performed=47%.

## 2019-07-31 NOTE — Progress Notes (Signed)
Progress Note  Patient Name: Devin Hanna Date of Encounter: 07/31/2019  [facility] Cardiologist: [name], MD   Subjective   Feeling well.  Notes that his breathing is at baseline.  He has lower extremity edema that he says is also at his baseline.  Wants to go home.  Notes that he is feeling thirsty.  Inpatient Medications    Scheduled Meds: . atorvastatin  40 mg Oral Daily  . clopidogrel  75 mg Oral Daily  . enoxaparin (LOVENOX) injection  60 mg Subcutaneous Q24H  . furosemide  40 mg Intravenous Q12H  . gabapentin  300 mg Oral TID  . insulin aspart  0-15 Units Subcutaneous TID WC  . insulin aspart  0-5 Units Subcutaneous QHS  . pantoprazole  40 mg Oral BID  . sodium chloride flush  3 mL Intravenous Q12H   Continuous Infusions: . sodium chloride     PRN Meds: sodium chloride, sodium chloride flush   Vital Signs    Vitals:   07/30/19 2022 07/31/19 0046 07/31/19 0455 07/31/19 0849  BP: (!) 114/59 117/62 122/63 (!) 153/68  Pulse: 63 (!) 58 67 69  Resp: 18  17 17   Temp: 98.2 F (36.8 C) 98.3 F (36.8 C) 98.1 F (36.7 C) 98 F (36.7 C)  TempSrc: Oral Oral Oral Oral  SpO2: 96% 93% 97% 98%  Weight:   116.9 kg   Height:        Intake/Output Summary (Last 24 hours) at 07/31/2019 0852 Last data filed at 07/31/2019 0738 Gross per 24 hour  Intake 240 ml  Output 1800 ml  Net -1560 ml   Last 3 Weights 07/31/2019 07/30/2019 07/30/2019  Weight (lbs) 257 lb 12.9 oz 259 lb 264 lb 15.9 oz  Weight (kg) 116.94 kg 117.482 kg 120.2 kg      Telemetry    Atrial fibrillation.  PVCs.  Rates less than 100 bpm.- Personally Reviewed  ECG     n/a- Personally Reviewed  Physical Exam   VS:  BP (!) 153/68 (BP Location: Right Arm)   Pulse 69   Temp 98 F (36.7 C) (Oral)   Resp 17   Ht 5' 10"  (1.778 m)   Wt 116.9 kg   SpO2 98%   BMI 36.99 kg/m  , BMI Body mass index is 36.99 kg/m. GENERAL: Chronically ill-appearing.  No acute distress HEENT: Pupils equal  round and reactive, fundi not visualized, oral mucosa unremarkable NECK:  No jugular venous distention, waveform within normal limits, carotid upstroke brisk and symmetric, no bruits LUNGS:    Diminished breath sounds.  No crackles. HEART:  RRR.  PMI not displaced or sustained,S1 and S2 within normal limits, no S3, no S4, no clicks, no rubs, no murmurs ABD:  Flat, positive bowel sounds normal in frequency in pitch, no bruits, no rebound, no guarding, no midline pulsatile mass, no hepatomegaly, no splenomegaly EXT:  2 plus pulses throughout, 2+ LE edema, no cyanosis no clubbing SKIN: Chronic venous stasis NEURO:  Cranial nerves II through XII grossly intact, motor grossly intact throughout PSYCH:  Cognitively intact, oriented to person place and time   Labs    High Sensitivity Troponin:   Recent Labs  Lab 07/30/19 1100  TROPONINIHS 16      Chemistry Recent Labs  Lab 07/28/19 1040 07/28/19 1040 07/30/19 1100 07/30/19 1104 07/31/19 0015  NA 139   < > 136 137 139  K 4.7   < > 6.1* 5.5* 4.7  CL 103  --  103  --  103  CO2 25  --  21*  --  24  GLUCOSE 111*  --  268*  --  106*  BUN 20  --  21  --  24*  CREATININE 1.65*  --  1.81*  --  1.91*  CALCIUM 8.7*  --  8.8*  --  9.0  PROT 6.8  --  6.7  --   --   ALBUMIN 3.4*  --  3.3*  --   --   AST 45*  --  60*  --   --   ALT 39  --  36  --   --   ALKPHOS 80  --  86  --   --   BILITOT 1.0  --  1.7*  --   --   GFRNONAA 41*  --  37*  --  34*  GFRAA 48*  --  43*  --  40*  ANIONGAP 11  --  12  --  12   < > = values in this interval not displayed.     Hematology Recent Labs  Lab 07/28/19 1040 07/28/19 1040 07/30/19 1100 07/30/19 1104 07/31/19 0015  WBC 6.8  --  7.2  --  8.2  RBC 3.81*  --  4.33  --  4.08*  HGB 12.2*   < > 14.0 13.9 12.9*  HCT 36.1*   < > 43.0 41.0 39.2  MCV 94.8  --  99.3  --  96.1  MCH 32.0  --  32.3  --  31.6  MCHC 33.8  --  32.6  --  32.9  RDW 13.5  --  13.4  --  13.6  PLT 172  --  195  --  170   < > =  values in this interval not displayed.    BNP Recent Labs  Lab 07/30/19 1100  BNP 387.5*     DDimer No results for input(s): DDIMER in the last 168 hours.   Radiology    DG Chest Portable 1 View  Result Date: 07/30/2019 CLINICAL DATA:  Shortness of breath, CHF EXAM: PORTABLE CHEST 1 VIEW COMPARISON:  06/20/2016, 02/14/2019 FINDINGS: Cardiomegaly. Pulmonary vascular congestion with diffusely increased interstitial markings. There are slightly more confluent airspace opacity within the bilateral lung bases, right greater than left. No large pleural fluid collection. No pneumothorax. IMPRESSION: Cardiomegaly with pulmonary vascular congestion and interstitial edema. More confluent bibasilar airspace opacities may reflect a component of alveolar edema versus superimposed infection. Electronically Signed   By: Davina Poke D.O.   On: 07/30/2019 11:59    Cardiac Studies   TTE 07/19/18: 1. There is akinesis of the left ventricular, entire inferior wall.  2. The left ventricle has moderately reduced systolic function, with an  ejection fraction of 35-40%. The cavity size was mildly dilated. There is  mildly increased left ventricular wall thickness. Left ventricular  diastolic Doppler parameters are  consistent with pseudonormalization. Elevated mean left atrial pressure.  3. The right ventricle has normal systolic function. The cavity was  mildly enlarged. There is no increase in right ventricular wall thickness.  Right ventricular systolic pressure could not be assessed.  4. Left atrial size was mildly dilated.  5. Right atrial size was mildly dilated.  6. The mitral valve was not well visualized. There is moderate mitral  annular calcification present.  7. The aortic valve is tricuspid. Mild thickening of the aortic valve.  8. The aortic root and ascending aorta are normal in size and  structure.  9. The inferior vena cava was normal in size with <50% respiratory    variability.  10. The interatrial septum was not well visualized.   Patient Profile     69 y.o. male with CAD s/p RCA PCI, chronic systolic and diastolic heart failure, LBBB, hypertension, LBBB, and morbid obesity admitted with acute on chronic systolic and diastolic heart failure.  Assessment & Plan    # Acute on chronic systolic and diastolic heart failure: # Hypertension: BNP 388.  Home Entresto and spironolactone on hold 2/2 hyperkalemia.  Carvedilol held 2/2 hypotension.  Volume status is difficult to assess.  He has lower extremity edema but no crackles on exam.  He is only net -1.3 L.  However his renal function is worsening.  He thinks that he is at his baseline, if not dry.  On exam he seems still somewhat volume overloaded.  We will asked the heart failure service to put the REDS vest on him to get a better assessment of his volume status.  Hold additional IV Lasix for now.  Echo pending.  # CAD s/p RCA PCI: Continue clopidogrel only 2/2 history of gastric ulcers.   # AKI:  Creatinine worsening with diuresis.  Hold IV lasix for now.  Entresto and spironolactone on hold.  # Liver lesion:  Concern for hepatocellular carcinoma.  MRI completed and read pending.      For questions or updates, please contact [place] Please consult [email or web address] for contact info under        Signed, Skeet Latch, MD  07/31/2019, 8:52 AM

## 2019-08-01 DIAGNOSIS — E1142 Type 2 diabetes mellitus with diabetic polyneuropathy: Secondary | ICD-10-CM

## 2019-08-01 LAB — BASIC METABOLIC PANEL
Anion gap: 12 (ref 5–15)
BUN: 24 mg/dL — ABNORMAL HIGH (ref 8–23)
CO2: 26 mmol/L (ref 22–32)
Calcium: 9 mg/dL (ref 8.9–10.3)
Chloride: 100 mmol/L (ref 98–111)
Creatinine, Ser: 1.92 mg/dL — ABNORMAL HIGH (ref 0.61–1.24)
GFR calc Af Amer: 40 mL/min — ABNORMAL LOW (ref >60)
GFR calc non Af Amer: 34 mL/min — ABNORMAL LOW (ref >60)
Glucose, Bld: 143 mg/dL — ABNORMAL HIGH (ref 70–99)
Potassium: 3.9 mmol/L (ref 3.5–5.1)
Sodium: 138 mmol/L (ref 135–145)

## 2019-08-01 LAB — CBC
HCT: 38.4 % — ABNORMAL LOW (ref 39.0–52.0)
Hemoglobin: 12.4 g/dL — ABNORMAL LOW (ref 13.0–17.0)
MCH: 31.2 pg (ref 26.0–34.0)
MCHC: 32.3 g/dL (ref 30.0–36.0)
MCV: 96.7 fL (ref 80.0–100.0)
Platelets: 148 10*3/uL — ABNORMAL LOW (ref 150–400)
RBC: 3.97 MIL/uL — ABNORMAL LOW (ref 4.22–5.81)
RDW: 13.4 % (ref 11.5–15.5)
WBC: 7.4 10*3/uL (ref 4.0–10.5)
nRBC: 0 % (ref 0.0–0.2)

## 2019-08-01 LAB — GLUCOSE, CAPILLARY
Glucose-Capillary: 282 mg/dL — ABNORMAL HIGH (ref 70–99)
Glucose-Capillary: 131 mg/dL — ABNORMAL HIGH (ref 70–99)

## 2019-08-01 MED ORDER — FUROSEMIDE 40 MG PO TABS: 40.0000 mg | ORAL_TABLET | Freq: Two times a day (BID) | ORAL | Status: DC

## 2019-08-01 MED ORDER — FUROSEMIDE 40 MG PO TABS: 40.0000 mg | ORAL_TABLET | Freq: Two times a day (BID) | ORAL | 0 refills | Status: DC

## 2019-08-01 MED ORDER — CARVEDILOL 6.25 MG PO TABS: 6.2500 mg | ORAL_TABLET | Freq: Two times a day (BID) | ORAL | Status: DC

## 2019-08-01 MED ORDER — CARVEDILOL 6.25 MG PO TABS: 6.2500 mg | ORAL_TABLET | Freq: Two times a day (BID) | ORAL | 0 refills | Status: DC

## 2019-08-01 NOTE — Plan of Care (Signed)
  Problem: Safety: Goal: Ability to remain free from injury will improve Outcome: Progressing   Problem: Pain Managment: Goal: General experience of comfort will improve Outcome: Progressing   Problem: Coping: Goal: Level of anxiety will decrease Outcome: Progressing

## 2019-08-01 NOTE — Care Management (Signed)
08-01-19 1026 Case Manager received a consult for Heart Failure [place]. Patient is independent from home alone. Patient works and is not homebound. No home health needs identified at this time. Plan is for patient to transition home today. Graves-Bigelow, Ocie Cornfield , RN, BSN Case Manager
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
## 2019-08-01 NOTE — Plan of Care (Signed)
Problem: Education: Goal: Ability to demonstrate management of disease process will improve 08/01/2019 1047 by Threasa Beards, RN Outcome: Adequate for Discharge 08/01/2019 0844 by Threasa Beards, RN Outcome: Progressing Goal: Ability to verbalize understanding of medication therapies will improve 08/01/2019 1047 by Threasa Beards, RN Outcome: Adequate for Discharge 08/01/2019 0844 by Threasa Beards, RN Outcome: Progressing Goal: Individualized Educational Video(s) 08/01/2019 1047 by Threasa Beards, RN Outcome: Adequate for Discharge 08/01/2019 0844 by Threasa Beards, RN Outcome: Progressing   Problem: Activity: Goal: Capacity to carry out activities will improve 08/01/2019 1047 by Threasa Beards, RN Outcome: Adequate for Discharge 08/01/2019 0844 by Threasa Beards, RN Outcome: Progressing   Problem: Cardiac: Goal: Ability to achieve and maintain adequate cardiopulmonary perfusion will improve 08/01/2019 1047 by Threasa Beards, RN Outcome: Adequate for Discharge 08/01/2019 0844 by Threasa Beards, RN Outcome: Progressing   Problem: Education: Goal: Knowledge of General Education information will improve Description: Including pain rating scale, medication(s)/side effects and non-pharmacologic comfort measures 08/01/2019 1047 by Threasa Beards, RN Outcome: Adequate for Discharge 08/01/2019 0844 by Threasa Beards, RN Outcome: Progressing   Problem: Health Behavior/Discharge Planning: Goal: Ability to manage health-related needs will improve 08/01/2019 1047 by Threasa Beards, RN Outcome: Adequate for Discharge 08/01/2019 0844 by Threasa Beards, RN Outcome: Progressing   Problem: Clinical Measurements: Goal: Ability to maintain clinical measurements within normal limits will improve 08/01/2019 1047 by Threasa Beards, RN Outcome: Adequate for Discharge 08/01/2019 0844 by Threasa Beards, RN Outcome: Progressing Goal: Will remain free from  infection 08/01/2019 1047 by Threasa Beards, RN Outcome: Adequate for Discharge 08/01/2019 0844 by Threasa Beards, RN Outcome: Progressing Goal: Diagnostic test results will improve 08/01/2019 1047 by Threasa Beards, RN Outcome: Adequate for Discharge 08/01/2019 0844 by Threasa Beards, RN Outcome: Progressing Goal: Respiratory complications will improve 08/01/2019 1047 by Threasa Beards, RN Outcome: Adequate for Discharge 08/01/2019 0844 by Threasa Beards, RN Outcome: Progressing Goal: Cardiovascular complication will be avoided 08/01/2019 1047 by Threasa Beards, RN Outcome: Adequate for Discharge 08/01/2019 0844 by Threasa Beards, RN Outcome: Progressing   Problem: Activity: Goal: Risk for activity intolerance will decrease 08/01/2019 1047 by Threasa Beards, RN Outcome: Adequate for Discharge 08/01/2019 0844 by Threasa Beards, RN Outcome: Progressing   Problem: Nutrition: Goal: Adequate nutrition will be maintained 08/01/2019 1047 by Threasa Beards, RN Outcome: Adequate for Discharge 08/01/2019 0844 by Threasa Beards, RN Outcome: Progressing   Problem: Coping: Goal: Level of anxiety will decrease 08/01/2019 1047 by Threasa Beards, RN Outcome: Adequate for Discharge 08/01/2019 0844 by Threasa Beards, RN Outcome: Progressing   Problem: Elimination: Goal: Will not experience complications related to bowel motility 08/01/2019 1047 by Threasa Beards, RN Outcome: Adequate for Discharge 08/01/2019 0844 by Threasa Beards, RN Outcome: Progressing Goal: Will not experience complications related to urinary retention 08/01/2019 1047 by Threasa Beards, RN Outcome: Adequate for Discharge 08/01/2019 0844 by Threasa Beards, RN Outcome: Progressing   Problem: Pain Managment: Goal: General experience of comfort will improve 08/01/2019 1047 by Threasa Beards, RN Outcome: Adequate for Discharge 08/01/2019 0844 by Threasa Beards,  RN Outcome: Progressing   Problem: Safety: Goal: Ability to remain free from injury will improve 08/01/2019 1047 by Threasa Beards, RN Outcome: Adequate for Discharge 08/01/2019 0844 by Threasa Beards, RN Outcome: Progressing   Problem: Skin Integrity: Goal: Risk for impaired skin integrity  will decrease 08/01/2019 1047 by Threasa Beards, RN Outcome: Adequate for Discharge 08/01/2019 0844 by Threasa Beards, RN Outcome: Progressing

## 2019-08-01 NOTE — Progress Notes (Signed)
Physical Therapy Treatment Patient Details Name: Devin Hanna MRN: [id number] DOB: [birth year] Today's Date: 08/01/2019    History of Present Illness 69 yo male with onset of acute CHF and pulm vasc congestion was admitted, noted cardiomegaly, EF 35-40%, AKI and chronic SOB.  PMHx:  L BBB, CHF, CAD with stents, HTN, acute respiratory failure, liver mass, PN (symptomatic), L knee tibial fracture with hardware    PT Comments    Pt sitting on EoB on entry, agreeable to ambulation with therapy. Pt reports he has lost 7 lbs of fluid and is feeling much better. Pt limited in safe mobility by bilateral LE peripheral neuropathy which impairs his proprioception as well as decreased endurance. Pt is currently mo I for transfers and min guard for ambulation and ascent/descent of 7 steps. Pt is hopeful for d/c home today and has no therapy or equipment needs at this time.     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 Restrictions Weight Bearing Restrictions: No Other Position/Activity Restrictions: poor foot sensation    Mobility  Bed Mobility               General bed mobility comments: sitting EoB on entry   Transfers Overall transfer level: Needs assistance Equipment used: None Transfers: Sit to/from Stand Sit to Stand: Modified independent (Device/Increase time)         General transfer comment: increased effort, good power up and steadyig   Ambulation/Gait Ambulation/Gait assistance: Min guard (for safety) Gait Distance (Feet): 400 Feet Assistive device: None Gait Pattern/deviations: Step-through pattern;Decreased stride length;Wide base of support Gait velocity: reduced Gait velocity interpretation: <1.8 ft/sec, indicate of risk for recurrent falls General Gait Details: pt prefers to be  near walls in hallway but does not have any LoB requiring handrail assist   Stairs Stairs: Yes Stairs assistance: Min guard Stair Management: One rail Left;Alternating pattern;Step to pattern;Forwards Number of Stairs: 7 General stair comments: min guard for safety, pt removes glasses and uses a step to pattern to descend stairs, and is step over step to climb steps          Balance Overall balance assessment: Needs assistance Sitting-balance support: Feet supported Sitting balance-Leahy Scale: Good     Standing balance support: No upper extremity supported Standing balance-Leahy Scale: Fair                              Cognition Arousal/Alertness: Awake/alert Behavior During Therapy: WFL for tasks assessed/performed Overall Cognitive Status: Within Functional Limits for tasks assessed                                           General Comments General comments (skin integrity, edema, etc.): max HR with stair training 86 bpm and mild SoB      Pertinent Vitals/Pain Pain Assessment: No/denies pain           PT Goals (current goals can now be found in the care plan section) Acute Rehab PT Goals Patient Stated Goal: to get home and back to work PT Goal Formulation: With patient Time For Goal Achievement: 08/14/19 Potential to Achieve Goals: Good Progress towards PT  goals: Progressing toward goals    Frequency    Min 3X/week      PT Plan Current plan remains appropriate       AM-PAC PT "6 Clicks" Mobility   Outcome Measure  Help needed turning from your back to your side while in a flat bed without using bedrails?: None Help needed moving from lying on your back to sitting on the side of a flat bed without using bedrails?: None Help needed moving to and from a bed to a chair (including a wheelchair)?: None Help needed standing up from a chair using your arms (e.g., wheelchair or bedside chair)?: None Help needed to walk in  hospital room?: A Andis Help needed climbing 3-5 steps with a railing? : A Troupe 6 Click Score: 22    End of Session Equipment Utilized During Treatment: Gait belt Activity Tolerance: Patient tolerated treatment well;Treatment limited secondary to medical complications (Comment) Patient left: in bed;with call bell/phone within reach;with bed alarm set Nurse Communication: Mobility status PT Visit Diagnosis: Other abnormalities of gait and mobility (R26.89)     Time: 0370-9643 PT Time Calculation (min) (ACUTE ONLY): 12 min  Charges:  $Therapeutic Exercise: 8-22 mins                     Kaisy Severino B. Migdalia Dk PT, DPT Acute Rehabilitation Services Pager [phone] Office [phone]    [place] 08/01/2019, 10:23 AM

## 2019-08-01 NOTE — Addendum Note (Signed)
Addended by: Gloris Ham on: 08/01/2019 08:27 AM   Modules accepted: Orders

## 2019-08-01 NOTE — Progress Notes (Signed)
Progress Note  Patient Name: Devin Hanna Date of Encounter: 08/01/2019  [facility] Cardiologist: [name], MD   Subjective   Feeling well. Wants to go home.  Inpatient Medications    Scheduled Meds: . atorvastatin  40 mg Oral Daily  . clopidogrel  75 mg Oral Daily  . enoxaparin (LOVENOX) injection  60 mg Subcutaneous Q24H  . furosemide  40 mg Intravenous Q12H  . gabapentin  300 mg Oral TID  . insulin aspart  0-15 Units Subcutaneous TID WC  . insulin aspart  0-5 Units Subcutaneous QHS  . pantoprazole  40 mg Oral BID  . sodium chloride flush  3 mL Intravenous Q12H   Continuous Infusions: . sodium chloride     PRN Meds: sodium chloride, sodium chloride flush   Vital Signs    Vitals:   07/31/19 1204 07/31/19 1626 07/31/19 2000 08/01/19 0527  BP: (!) 141/62 (!) 159/69 136/68 113/61  Pulse: 65 61  65  Resp: 18 20 18    Temp: 98.4 F (36.9 C) 97.7 F (36.5 C) 98.4 F (36.9 C) 98.5 F (36.9 C)  TempSrc: Oral Oral Oral Oral  SpO2: 96% 96% 96% 96%  Weight:    113.6 kg  Height:        Intake/Output Summary (Last 24 hours) at 08/01/2019 0945 Last data filed at 08/01/2019 0853 Gross per 24 hour  Intake --  Output 1800 ml  Net -1800 ml   Last 3 Weights 08/01/2019 07/31/2019 07/30/2019  Weight (lbs) 250 lb 8 oz 257 lb 12.9 oz 259 lb  Weight (kg) 113.626 kg 116.94 kg 117.482 kg      Telemetry    Atrial fibrillation.  PVCs.  Rates less than 100 bpm.- Personally Reviewed  ECG    n/a- Personally Reviewed  Physical Exam   VS:  BP 113/61 (BP Location: Left Arm)   Pulse 65   Temp 98.5 F (36.9 C) (Oral)   Resp 18   Ht 5' 10"  (1.778 m)   Wt 113.6 kg   SpO2 96%   BMI 35.94 kg/m  , BMI Body mass index is 35.94 kg/m. GENERAL: Chronically ill-appearing.  No acute distress HEENT: Pupils equal round and reactive, fundi not visualized, oral mucosa unremarkable NECK:  No jugular venous distention, waveform within normal limits, carotid upstroke brisk and  symmetric, no bruits LUNGS:    Clear to auscultation bilaterally.  No crackles. HEART:  RRR.  PMI not displaced or sustained,S1 and S2 within normal limits, no S3, no S4, no clicks, no rubs, no murmurs ABD:  Flat, positive bowel sounds normal in frequency in pitch, no bruits, no rebound, no guarding, no midline pulsatile mass, no hepatomegaly, no splenomegaly EXT:  2 plus pulses throughout, 1+ LE edema, no cyanosis no clubbing SKIN: Chronic venous stasis NEURO:  Cranial nerves II through XII grossly intact, motor grossly intact throughout PSYCH:  Cognitively intact, oriented to person place and time   Labs    High Sensitivity Troponin:   Recent Labs  Lab 07/30/19 1100  TROPONINIHS 16      Chemistry Recent Labs  Lab 07/28/19 1040 07/28/19 1040 07/30/19 1100 07/30/19 1100 07/30/19 1104 07/31/19 0015 08/01/19 0451  NA 139   < > 136   < > 137 139 138  K 4.7   < > 6.1*   < > 5.5* 4.7 3.9  CL 103   < > 103  --   --  103 100  CO2 25   < >  21*  --   --  24 26  GLUCOSE 111*   < > 268*  --   --  106* 143*  BUN 20   < > 21  --   --  24* 24*  CREATININE 1.65*   < > 1.81*  --   --  1.91* 1.92*  CALCIUM 8.7*   < > 8.8*  --   --  9.0 9.0  PROT 6.8  --  6.7  --   --   --   --   ALBUMIN 3.4*  --  3.3*  --   --   --   --   AST 45*  --  60*  --   --   --   --   ALT 39  --  36  --   --   --   --   ALKPHOS 80  --  86  --   --   --   --   BILITOT 1.0  --  1.7*  --   --   --   --   GFRNONAA 41*   < > 37*  --   --  34* 34*  GFRAA 48*   < > 43*  --   --  40* 40*  ANIONGAP 11   < > 12  --   --  12 12   < > = values in this interval not displayed.     Hematology Recent Labs  Lab 07/30/19 1100 07/30/19 1100 07/30/19 1104 07/31/19 0015 08/01/19 0451  WBC 7.2  --   --  8.2 7.4  RBC 4.33  --   --  4.08* 3.97*  HGB 14.0   < > 13.9 12.9* 12.4*  HCT 43.0   < > 41.0 39.2 38.4*  MCV 99.3  --   --  96.1 96.7  MCH 32.3  --   --  31.6 31.2  MCHC 32.6  --   --  32.9 32.3  RDW 13.4  --   --   13.6 13.4  PLT 195  --   --  170 148*   < > = values in this interval not displayed.    BNP Recent Labs  Lab 07/30/19 1100  BNP 387.5*     DDimer No results for input(s): DDIMER in the last 168 hours.   Radiology    DG Chest Portable 1 View  Result Date: 07/30/2019 CLINICAL DATA:  Shortness of breath, CHF EXAM: PORTABLE CHEST 1 VIEW COMPARISON:  06/20/2016, 02/14/2019 FINDINGS: Cardiomegaly. Pulmonary vascular congestion with diffusely increased interstitial markings. There are slightly more confluent airspace opacity within the bilateral lung bases, right greater than left. No large pleural fluid collection. No pneumothorax. IMPRESSION: Cardiomegaly with pulmonary vascular congestion and interstitial edema. More confluent bibasilar airspace opacities may reflect a component of alveolar edema versus superimposed infection. Electronically Signed   By: Davina Poke D.O.   On: 07/30/2019 11:59   ECHOCARDIOGRAM COMPLETE  Result Date: 07/31/2019    ECHOCARDIOGRAM REPORT   Patient Name:   Devin Hanna Pardon Date of Exam: 07/31/2019 Medical Rec #:  [id number]      Height:       70.0 in Accession #:    [id number]     Weight:       257.8 lb Date of Birth:  April 26, 1950      BSA:          2.325 m Patient Age:    69 years  BP:           141/62 mmHg Patient Gender: M              HR:           68 bpm. Exam Location:  Inpatient Procedure: 2D Echo and Intracardiac Opacification Agent Indications:    CHF-Acute Systolic V78.46  History:        Patient has prior history of Echocardiogram examinations, most                 recent 07/19/2018. CHF, CAD; Risk Factors:Hypertension,                 Dyslipidemia and Diabetes.  Sonographer:    Mikki Santee RDCS (AE) Referring Phys: [id number] JENNIFER YATES  Sonographer Comments: Suboptimal parasternal window and suboptimal apical window. IMPRESSIONS  1. Technically difficult; definity used; severe global reduction in LV systolic function; grade 1 diastolic dysfunction;  mild LVE.  2. Left ventricular ejection fraction, by estimation, is 25 to 30%. The left ventricle has severely decreased function. The left ventricle demonstrates global hypokinesis. The left ventricular internal cavity size was mildly dilated. Left ventricular diastolic parameters are consistent with Grade I diastolic dysfunction (impaired relaxation).  3. Right ventricular systolic function is normal. The right ventricular size is normal.  4. The mitral valve is normal in structure. Trivial mitral valve regurgitation. No evidence of mitral stenosis.  5. The aortic valve has an indeterminant number of cusps. Aortic valve regurgitation is not visualized. No aortic stenosis is present.  6. The inferior vena cava is normal in size with greater than 50% respiratory variability, suggesting right atrial pressure of 3 mmHg. FINDINGS  Left Ventricle: Left ventricular ejection fraction, by estimation, is 25 to 30%. The left ventricle has severely decreased function. The left ventricle demonstrates global hypokinesis. Definity contrast agent was given IV to delineate the left ventricular endocardial borders. The left ventricular internal cavity size was mildly dilated. There is no left ventricular hypertrophy. Left ventricular diastolic parameters are consistent with Grade I diastolic dysfunction (impaired relaxation). Right Ventricle: The right ventricular size is normal. Right ventricular systolic function is normal. Left Atrium: Left atrial size was normal in size. Right Atrium: Right atrial size was normal in size. Pericardium: There is no evidence of pericardial effusion. Mitral Valve: The mitral valve is normal in structure. Normal mobility of the mitral valve leaflets. Mild mitral annular calcification. Trivial mitral valve regurgitation. No evidence of mitral valve stenosis. Tricuspid Valve: The tricuspid valve is normal in structure. Tricuspid valve regurgitation is trivial. No evidence of tricuspid stenosis. Aortic  Valve: The aortic valve has an indeterminant number of cusps. Aortic valve regurgitation is not visualized. No aortic stenosis is present. Pulmonic Valve: The pulmonic valve was not well visualized. Pulmonic valve regurgitation is not visualized. No evidence of pulmonic stenosis. Aorta: The aortic root is normal in size and structure. Venous: The inferior vena cava is normal in size with greater than 50% respiratory variability, suggesting right atrial pressure of 3 mmHg.  Additional Comments: Technically difficult; definity used; severe global reduction in LV systolic function; grade 1 diastolic dysfunction; mild LVE.  LEFT VENTRICLE PLAX 2D LVIDd:         5.90 cm  Diastology LVIDs:         5.10 cm  LV e' lateral:   8.59 cm/s LV PW:         0.90 cm  LV E/e' lateral: 8.3 LV IVS:  0.90 cm  LV e' medial:    5.26 cm/s LVOT diam:     2.40 cm  LV E/e' medial:  13.5 LV SV:         105 LV SV Index:   45 LVOT Area:     4.52 cm  RIGHT VENTRICLE TAPSE (M-mode): 1.1 cm LEFT ATRIUM             Index       RIGHT ATRIUM           Index LA diam:        4.90 cm 2.11 cm/m  RA Area:     14.90 cm LA Vol (A2C):   80.4 ml 34.58 ml/m RA Volume:   37.00 ml  15.91 ml/m LA Vol (A4C):   54.3 ml 23.35 ml/m LA Biplane Vol: 66.3 ml 28.51 ml/m  AORTIC VALVE LVOT Vmax:   100.00 cm/s LVOT Vmean:  59.500 cm/s LVOT VTI:    0.232 m  AORTA Ao Root diam: 3.20 cm MITRAL VALVE MV Area (PHT): 2.26 cm    SHUNTS MV Decel Time: 335 msec    Systemic VTI:  0.23 m MV E velocity: 71.10 cm/s  Systemic Diam: 2.40 cm MV A velocity: 79.70 cm/s MV E/A ratio:  0.89 Kirk Ruths MD Electronically signed by Kirk Ruths MD Signature Date/Time: 07/31/2019/4:22:07 PM    Final     Cardiac Studies   TTE 07/19/18: 1. There is akinesis of the left ventricular, entire inferior wall.  2. The left ventricle has moderately reduced systolic function, with an  ejection fraction of 35-40%. The cavity size was mildly dilated. There is  mildly increased left  ventricular wall thickness. Left ventricular  diastolic Doppler parameters are  consistent with pseudonormalization. Elevated mean left atrial pressure.  3. The right ventricle has normal systolic function. The cavity was  mildly enlarged. There is no increase in right ventricular wall thickness.  Right ventricular systolic pressure could not be assessed.  4. Left atrial size was mildly dilated.  5. Right atrial size was mildly dilated.  6. The mitral valve was not well visualized. There is moderate mitral  annular calcification present.  7. The aortic valve is tricuspid. Mild thickening of the aortic valve.  8. The aortic root and ascending aorta are normal in size and structure.  9. The inferior vena cava was normal in size with <50% respiratory  variability.  10. The interatrial septum was not well visualized.   Echo 07/31/19: 1. Technically difficult; definity used; severe global reduction in LV  systolic function; grade 1 diastolic dysfunction; mild LVE.  2. Left ventricular ejection fraction, by estimation, is 25 to 30%. The  left ventricle has severely decreased function. The left ventricle  demonstrates global hypokinesis. The left ventricular internal cavity size  was mildly dilated. Left ventricular  diastolic parameters are consistent with Grade I diastolic dysfunction  (impaired relaxation).  3. Right ventricular systolic function is normal. The right ventricular  size is normal.  4. The mitral valve is normal in structure. Trivial mitral valve  regurgitation. No evidence of mitral stenosis.  5. The aortic valve has an indeterminant number of cusps. Aortic valve  regurgitation is not visualized. No aortic stenosis is present.  6. The inferior vena cava is normal in size with greater than 50%  respiratory variability, suggesting right atrial pressure of 3 mmHg.  Patient Profile     69 y.o. male with CAD s/p RCA PCI, chronic systolic and diastolic heart  failure, LBBB, hypertension, LBBB, and morbid obesity admitted with acute on chronic systolic and diastolic heart failure.  Assessment & Plan    # Acute on chronic systolic and diastolic heart failure: # Hypertension: BNP 388.  Home Entresto and spironolactone on hold 2/2 hyperkalemia and acute renal failure.  Carvedilol held 2/2 hypotension.   Will resume at 6.30m bid.  REDS vest was elevated yesterday at 47%.  Diuresis was resumed and renal function stable today.  He is -3L and feeling well.  He is eager to go home.  We will increase lasix to 448mbid.  He will need follow up and a BMP later this week or early next week.  We discussed repeating Lexiscan Myoview as an outpatient given reduced LVEF.  Would add either hydral/nitrates or Entresto back as an outpatient as BP/renal function allow.  He may benefit from HFS consult as outpatient.  Discussed limiting sodium and fluids to 64oz.  # CAD s/p RCA PCI: Continue clopidogrel only 2/2 history of gastric ulcers.  Outpatient Lexiscan Myoview.   # AKI:  Creatinine stable.  One more dose of IV lasix and switch to 4046mo bid.  # Liver lesion:  Per IM.  [facility] will sign off.   Medication Recommendations:  Carvedilol 6.72m63md.  Resume lasix 40mg19mbid tomorrow Other recommendations (labs, testing, etc):  Bmp later this week/early next week.  Lexiscan Myoview.  Consider HF consult Follow up as an outpatient:  [place]       For questions or updates, please contact [facility] consult [email or web address] for contact info under        Signed, TiffaSkeet Latch 08/01/2019, 9:45 AM

## 2019-08-02 ENCOUNTER — Telehealth: Payer: Self-pay

## 2019-08-02 NOTE — Telephone Encounter (Signed)
Colette, please arrange for additional appointments.

## 2019-08-02 NOTE — Telephone Encounter (Signed)
Patient is discharged 08/01/19, scheduled with Cardiology 08/05/19.  Declines hfu with PMD at this time, agrees to keep previously scheduled appointment 10/10/19.  Encouraged to call office to schedule if anything changes and as needed.

## 2019-08-02 NOTE — Telephone Encounter (Signed)
Noted  

## 2019-08-08 ENCOUNTER — Other Ambulatory Visit: Payer: Self-pay

## 2019-08-08 ENCOUNTER — Ambulatory Visit: Payer: PPO | Admitting: Family

## 2019-08-08 ENCOUNTER — Encounter: Payer: Self-pay | Admitting: Family

## 2019-08-08 VITALS — BP 150/84 | HR 67 | Ht 69.0 in | Wt 258.4 lb

## 2019-08-08 DIAGNOSIS — I10 Essential (primary) hypertension: Secondary | ICD-10-CM

## 2019-08-08 DIAGNOSIS — E785 Hyperlipidemia, unspecified: Secondary | ICD-10-CM | POA: Diagnosis not present

## 2019-08-08 DIAGNOSIS — I2510 Atherosclerotic heart disease of native coronary artery without angina pectoris: Secondary | ICD-10-CM | POA: Diagnosis not present

## 2019-08-08 DIAGNOSIS — R0602 Shortness of breath: Secondary | ICD-10-CM

## 2019-08-08 DIAGNOSIS — I5042 Chronic combined systolic (congestive) and diastolic (congestive) heart failure: Secondary | ICD-10-CM | POA: Diagnosis not present

## 2019-08-08 NOTE — Patient Instructions (Addendum)
Medication Instructions:  No medication changes today.   *If you need a refill on your cardiac medications before your next appointment, please call your pharmacy*  Lab Work: Your physician recommends that you return for lab work in: BMET, BNP  If you have labs (blood work) drawn today and your tests are completely normal, you will receive your results only by: Marland Kitchen MyChart Message (if you have MyChart) OR . A paper copy in the mail If you have any lab test that is abnormal or we need to change your treatment, we will call you to review the results.  Testing/Procedures: Your EKG today shows normal sinus rhythm with left bundle branch block. This is a stable finding for you.   Follow-Up: At [facility], you and your health needs are our priority.  As part of our continuing mission to provide you with exceptional heart care, we have created designated Provider Care Teams.  These Care Teams include your primary Cardiologist (physician) and Advanced Practice Providers (APPs -  Physician Assistants and Nurse Practitioners) who all work together to provide you with the care you need, when you need it.  We recommend signing up for the patient portal called "MyChart".  Sign up information is provided on this After Visit Summary.  MyChart is used to connect with patients for Virtual Visits (Telemedicine).  Patients are able to view lab/test results, encounter notes, upcoming appointments, etc.  Non-urgent messages can be sent to your provider as well.   To learn more about what you can do with MyChart, go to [email or web address].    Your next appointment:  In 1 month with Dr. Fletcher Anon or APP   Heart Failure, Diagnosis  Heart failure is a condition in which the heart has trouble pumping blood because it has become weak or stiff. This means that the heart does not pump blood well enough for the body to stay healthy. For some people with heart failure, fluid may back up into the lungs. There may also  be swelling (edema) in the lower legs. Heart failure is usually a long-term (chronic) condition. It is important for you to take good care of yourself and follow the treatment plan from your health care provider. What are the causes? This condition may be caused by:  High blood pressure (hypertension). Hypertension causes the heart muscle to work harder than normal. This makes the heart stiff or weak.  Coronary artery disease, or CAD. CAD is the buildup of cholesterol and fat (plaque) in the arteries of the heart.  Heart attack, also called myocardial infarction. This injures the heart muscle, making it hard for the heart to pump blood.  Abnormal heart valves. The valves do not open and close properly, forcing the heart to pump harder to keep the blood flowing.  Heart muscle disease (cardiomyopathy or myocarditis). This is damage to the heart muscle. It can increase the risk of heart failure.  Lung disease. The heart works harder when the lungs are not healthy.  Abnormal heart rhythms. These can lead to heart failure. What increases the risk? The risk of heart failure increases as a person ages. This condition is also more likely to develop in people who:  Are overweight.  Are male.  Smoke or chew tobacco.  Abuse alcohol or illegal drugs.  Have taken medicines that can damage the heart, such as chemotherapy drugs.  Have diabetes.  Have abnormal heart rhythms.  Have thyroid problems.  Have low blood counts (anemia). What are the signs or symptoms?  Symptoms of this condition include:  Shortness of breath with activity, such as when climbing stairs.  A cough that does not go away.  Swelling of the feet, ankles, legs, or abdomen.  Losing weight for no reason.  Trouble breathing when lying flat (orthopnea).  Waking from sleep because of the need to sit up and get more air.  Rapid heartbeat.  Tiredness (fatigue) and loss of energy.  Feeling light-headed, dizzy, or  close to fainting.  Loss of appetite.  Nausea.  Waking up more often during the night to urinate (nocturia).  Confusion. How is this diagnosed? This condition is diagnosed based on:  Your medical history, symptoms, and a physical exam.  Diagnostic tests, which may include: ? Echocardiogram. ? Electrocardiogram (ECG). ? Chest X-ray. ? Blood tests. ? Exercise stress test. ? Radionuclide scans. ? Cardiac catheterization and angiogram. How is this treated? Treatment for this condition is aimed at managing the symptoms of heart failure. Medicines Treatment may include medicines that:  Help lower blood pressure by relaxing (dilating) the blood vessels. These medicines are called ACE inhibitors (angiotensin-converting enzyme) and ARBs (angiotensin receptor blockers).  Cause the kidneys to remove salt and water from the blood through urination (diuretics).  Improve heart muscle strength and prevent the heart from beating too fast (beta blockers).  Increase the force of the heartbeat (digoxin). Healthy behavior changes     Treatment may also include making healthy lifestyle changes, such as:  Reaching and staying at a healthy weight.  Quitting smoking or chewing tobacco.  Eating heart-healthy foods.  Limiting or avoiding alcohol.  Stopping the use of illegal drugs.  Being physically active.  Other treatments Other treatments may include:  Procedures to open blocked arteries or repair damaged valves.  Placing a pacemaker to improve heart function (cardiac resynchronization therapy).  Placing a device to treat serious abnormal heart rhythms (implantable cardioverter defibrillator, or ICD).  Placing a device to improve the pumping ability of the heart (left ventricular assist device, or LVAD).  Receiving a healthy heart from a donor (heart transplant). This is done when other treatments have not helped. Follow these instructions at home:  Manage other health  conditions as told by your health care provider. These may include hypertension, diabetes, thyroid disease, or abnormal heart rhythms.  Get ongoing education and support as needed. Learn as much as you can about heart failure.  Keep all follow-up visits as told by your health care provider. This is important. Summary  Heart failure is a condition in which the heart has trouble pumping blood because it has become weak or stiff.  This condition is caused by high blood pressure and other diseases of the heart and lungs.  Symptoms of this condition include shortness of breath, tiredness (fatigue), nausea, and swelling of the feet, ankles, legs, or abdomen.  Treatments for this condition may include medicines, lifestyle changes, and surgery.  Manage other health conditions as told by your health care provider. This information is not intended to replace advice given to you by your health care provider. Make sure you discuss any questions you have with your health care provider. Document Revised: 04/15/2018 Document Reviewed: 04/15/2018 Elsevier Patient Education  [place].

## 2019-08-08 NOTE — Progress Notes (Signed)
Office Visit    Patient Name: Devin Hanna Date of Encounter: 08/08/2019  Primary Care Provider:  [name], MD Primary Cardiologist:  [name], MD Electrophysiologist:  None   Chief Complaint    Devin Hanna is a 69 y.o. male with a hx of coronary disease, chronic systolic heart failure, left bundle branch block, peripheral neuropathy, prior GI bleed, GERD, DM2, HTN, HLD presents today for hospital follow-up  Past Medical History    Past Medical History:  Diagnosis Date  . Acute respiratory failure with hypoxia ([place]) 06/2016  . CAD (coronary artery disease)    a. Remote PCI of the RCA;  b. 03/2013 Inf STEMI/PCI: LAD 60-70d, RCA 99p ISR(rota/CBA/DES);  c. 09/2014 MV: inf and infsept infarct, EF 35-40%;  d. 09/2014 Cath: patent RCA stent, stable LAD dzs, EF 40%-->Med rx.  . CHF (congestive heart failure) ([place])   . Diabetes mellitus    sees Dr. Hardin Negus in [facility]  . GERD (gastroesophageal reflux disease)   . HFrEF (heart failure with reduced ejection fraction) ([place])   . Hyperlipidemia   . Hypertension   . Ischemic cardiomyopathy    a. 09/2014 EF 40% by V gram;  b. 06/2016 Echo: EF 40, diff HK, Gr1 DD, inf HK, no mural thrombus. Mild MR, mildly dil LA, mildly dil RV.   . LBBB (left bundle branch block)    Old  . Liver mass   . Morbid obesity ([place])   . Peripheral neuropathy   . Upper GI bleed   . Vertigo    Past Surgical History:  Procedure Laterality Date  . CARDIAC CATHETERIZATION N/A 10/03/2014   Procedure: Left Heart Cath and Coronary Angiography;  Surgeon: [name], MD;  Location: [place] [facility];  Service: Cardiovascular;  Laterality: N/A;  . CIRCUMCISION N/A 07/22/2015   Procedure: CIRCUMCISION ADULT;  Surgeon: Hollice Espy, MD;  Location: [facility] ORS;  Service: Urology;  Laterality: N/A;  . COLONOSCOPY WITH PROPOFOL N/A 01/12/2019   Procedure: COLONOSCOPY WITH PROPOFOL;  Surgeon: Lucilla Lame, MD;  Location: [facility] ENDOSCOPY;  Service:  Endoscopy;  Laterality: N/A;  . CORONARY STENT PLACEMENT    . ESOPHAGOGASTRODUODENOSCOPY (EGD) WITH PROPOFOL N/A 09/12/2014   Procedure: ESOPHAGOGASTRODUODENOSCOPY (EGD) WITH PROPOFOL;  Surgeon: Ronald Lobo, MD;  Location: [facility] ENDOSCOPY;  Service: Endoscopy;  Laterality: N/A;  . EXTERNAL FIXATION LEG Left 04/03/2012   Procedure: EXTERNAL FIXATION LEG;  Surgeon: Wylene Simmer, MD;  Location: [place];  Service: Orthopedics;  Laterality: Left;  . EXTERNAL FIXATION REMOVAL Left 04/14/2012   Procedure: REMOVAL EXTERNAL FIXATION LEG;  Surgeon: Wylene Simmer, MD;  Location: [place];  Service: Orthopedics;  Laterality: Left;  . IRRIGATION AND DEBRIDEMENT KNEE Left 04/03/2012   Procedure: IRRIGATION AND DEBRIDEMENT KNEE;  Surgeon: Wylene Simmer, MD;  Location: [place];  Service: Orthopedics;  Laterality: Left;  . LEFT HEART CATHETERIZATION WITH CORONARY ANGIOGRAM N/A 04/05/2013   Procedure: LEFT HEART CATHETERIZATION WITH CORONARY ANGIOGRAM;  Surgeon: [name], MD;  Location: [place] CATH LAB;  Service: Cardiovascular;  Laterality: N/A;  . MASS EXCISION N/A 07/22/2015   Procedure: EXCISION PENILE MASS;  Surgeon: Hollice Espy, MD;  Location: [facility] ORS;  Service: Urology;  Laterality: N/A;  . ORIF TIBIA PLATEAU Left 04/14/2012   Procedure: OPEN REDUCTION INTERNAL FIXATION (ORIF) TIBIAL PLATEAU;  Surgeon: Wylene Simmer, MD;  Location: [place];  Service: Orthopedics;  Laterality: Left;  . PERCUTANEOUS CORONARY ROTOBLATOR INTERVENTION (PCI-R) N/A 04/06/2013   Procedure: PERCUTANEOUS CORONARY ROTOBLATOR INTERVENTION (PCI-R);  Surgeon: [name], MD;  Location: [facility] CATH LAB;  Service: Cardiovascular;  Laterality: N/A;    Allergies  No Known Allergies  History of Present Illness    Devin Hanna is a 70 y.o. male with a hx of coronary disease, chronic systolic heart failure, left bundle branch block, peripheral neuropathy, prior GI bleed, GERD, DM2, HTN, HLD.  He with last seen while hospitalized.  He has known CAD  with RCA stents in the past and prior MI.  He was hospitalized May 2018 for acute on chronic systolic heart failure.  Echo June 2020 with LVEF 35 to 40% which is stable compared to previous.  He was transition from ace inhibitor to [place] at that time.  He had hypotension on higher dose of Entresto and had to be decreased.    Seen in clinic 06/29/19 reported feeling overall well.  Was noted to have been diagnosed with liver lesion that was too small to biopsy and is being monitored.  He was admitted 07/30/2019-07/12/2019 for shortness of breath.  Due to kidney injury his Entresto and spironolactone were held and his Lasix was increased to 40 mg twice daily.  Underwent echocardiogram 07/31/2019 with LVEF 25-30%, LV mildly dilated, grade 1 diastolic dysfunction, trivial MR. He was recommended for outpatient stress testing as his EF had dropped to rule out ischemia as origin.  Present today with his daughter.  Reports feeling overall well since discharge.  Long discussion regarding heart failure education.  Discussed 2 L fluid restriction he does typically drink less than 2 L of fluid per day.  We discussed low-sodium diet and he has been much more careful about reading labels since discharge.  He has been weighing daily at home with weights 253 pounds to 257 pounds.  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  Questions when he can go back to work.  He typically works 10-hour shifts 6 days/week as he enjoys working.  Tells me his employer is very flexible with him about him going back to work gradually.   EKGs/Labs/Other Studies Reviewed:   The following studies were reviewed today:  Echo 07/31/19  1. Technically difficult; definity used; severe global reduction in LV  systolic function; grade 1 diastolic dysfunction; mild LVE.   2. Left ventricular ejection fraction, by estimation, is 25 to 30%. The  left ventricle has severely decreased function. The left ventricle  demonstrates global  hypokinesis. The left ventricular internal cavity size  was mildly dilated. Left ventricular  diastolic parameters are consistent with Grade I diastolic dysfunction  (impaired relaxation).   3. Right ventricular systolic function is normal. The right ventricular  size is normal.   4. The mitral valve is normal in structure. Trivial mitral valve  regurgitation. No evidence of mitral stenosis.   5. The aortic valve has an indeterminant number of cusps. Aortic valve  regurgitation is not visualized. No aortic stenosis is present.   6. The inferior vena cava is normal in size with greater than 50%  respiratory variability, suggesting right atrial pressure of 3 mmHg.    EKG:  EKG is ordered today.  The ekg ordered today demonstrates NSR 67 bpmwith LBBB and no acute St/T wave changes.   Recent Labs: 07/30/2019: ALT 36; B Natriuretic Peptide 387.5 08/01/2019: BUN 24; Creatinine, Ser 1.92; Hemoglobin 12.4; Platelets 148; Potassium 3.9; Sodium 138  Recent Lipid Panel    Component Value Date/Time   CHOL 130 04/03/2019 0837   CHOL 103 06/07/2014 0824  TRIG 199.0 (H) 04/03/2019 0837   HDL 45.90 04/03/2019 0837   HDL 38 (L) 06/07/2014 0824   CHOLHDL 3 04/03/2019 0837   VLDL 39.8 04/03/2019 0837   LDLCALC 44 04/03/2019 0837   LDLCALC 59 03/30/2018 0930    Home Medications   Current Meds  Medication Sig  . atorvastatin (LIPITOR) 40 MG tablet Take 1 tablet (40 mg total) by mouth daily.  . carvedilol (COREG) 6.25 MG tablet Take 1 tablet (6.25 mg total) by mouth 2 (two) times daily with a meal.  . Cinnamon 500 MG capsule Take 1,000 mg by mouth 2 (two) times daily with a meal.  . clopidogrel (PLAVIX) 75 MG tablet Take 75 mg by mouth daily.  . furosemide (LASIX) 40 MG tablet Take 1 tablet (40 mg total) by mouth 2 (two) times daily.  Marland Kitchen gabapentin (NEURONTIN) 300 MG capsule TAKE 1 CAPSULE BY MOUTH 3 TIMES DAILY  . glipiZIDE (GLUCOTROL XL) 10 MG 24 hr tablet Take 10 mg by mouth daily with  breakfast.  . [name] 100 MG tablet Take 100 mg by mouth daily.  . meloxicam (MOBIC) 15 MG tablet Take 15 mg by mouth daily.  . metFORMIN (GLUCOPHAGE-XR) 500 MG 24 hr tablet TAKE TWO TABLETS BY MOUTH TWICE DAILY WITH A MEAL (Patient taking differently: Take 1,000 mg by mouth 2 (two) times daily with a meal. TAKE TWO TABLETS BY MOUTH TWICE DAILY WITH A MEAL)  . metFORMIN (GLUCOPHAGE-XR) 500 MG 24 hr tablet Take 1,000 mg by mouth 2 (two) times daily.  . Multiple Vitamin (MULTIVITAMIN WITH MINERALS) TABS Take 1 tablet by mouth daily.  . nitroGLYCERIN (NITROSTAT) 0.4 MG SL tablet Place 1 tablet (0.4 mg total) under the tongue every 5 (five) minutes as needed for chest pain.  . pantoprazole (PROTONIX) 40 MG tablet TAKE ONE TABLET TWICE DAILY      Review of Systems   Review of Systems  Constitutional: Negative for chills, fever and malaise/fatigue.  Cardiovascular: Positive for dyspnea on exertion. Negative for chest pain, leg swelling, near-syncope, orthopnea, palpitations and syncope.  Respiratory: Negative for cough, shortness of breath and wheezing.   Gastrointestinal: Negative for nausea and vomiting.  Neurological: Negative for dizziness, light-headedness and weakness.   All other systems reviewed and are otherwise negative except as noted above.  Physical Exam    VS:  BP (!) 150/84 (BP Location: Left Arm, Patient Position: Sitting, Cuff Size: Large)   Pulse 67   Ht 5' 9"  (1.753 m)   Wt 258 lb 6 oz (117.2 kg)   SpO2 94%   BMI 38.16 kg/m  , BMI Body mass index is 38.16 kg/m. GEN: Well nourished, overweight,  well developed, in no acute distress. HEENT: normal. Neck: Supple, no JVD, carotid bruits, or masses. Cardiac: RRR, no murmurs, rubs, or gallops. No clubbing, cyanosis, edema.  Radials/DP/PT 2+ and equal bilaterally.  Respiratory: Respirations regular and unlabored, clear to auscultation bilaterally. GI: Soft, nontender, nondistended, BS + x 4. MS: No deformity or  atrophy. Skin: Warm and dry, no rash. Neuro:  Strength and sensation are intact. Psych: Normal affect.   Assessment & Plan    1. CAD - No chest pain, pressure, tightness.  He is on Plavix without aspirin due to prior gastric ulcers.  Plan for Lexiscan Myoview to rule out ischemia due to newly worsened LVEF, as below. 2. Chronic systolic heart failure -recent admission with LVEF 25 to 30%.  Previous LVEF 1 year ago 35 to 40%. GDMT includes Coreg 6.25  mg twice daily, Lasix 40 mg twice daily.  His Entresto and spironolactone were held during recent admission due to hyperkalemia and acute kidney injury.  NYHA II with DOE.  Euvolemic and well compensated on exam.  Discussed daily weights and to call for weight gain of 3 pounds overnight or 5 pounds in 1 week.  Reviewed low-sodium diet, less than 2 L fluid restriction.  Offered referral to cardiac rehab which he politely declines.  BMP, BNP today for monitoring to assess ability to resume some of his heart failure therapy. 3. HTN-BP mildly elevated today.  As above, reassess labs to see if we can add back some of his heart failure therapies as this will help with his blood pressure as well. 4. HLD, LDL goal less than 70-03/2019 LDL 44.  Continue present statin. 5. DM2 - Continue to follow with PCP.  Consider addition of [name] for dual heart failure and DM2 benefit.   Disposition: BMP, BNP today.  Follow up in 1 month(s) with Dr. Fletcher Anon or APP  [name], NP 08/08/2019, 2:29 PM

## 2019-08-08 NOTE — H&P (View-Only) (Signed)
Office Visit    Patient Name: Devin Hanna Date of Encounter: 08/08/2019  Primary Care Provider:  [name], MD Primary Cardiologist:  Devin Sacramento, MD Electrophysiologist:  None   Chief Complaint    Devin Hanna is a 69 y.o. male with a hx of coronary disease, chronic systolic heart failure, left bundle branch block, peripheral neuropathy, prior GI bleed, GERD, DM2, HTN, HLD presents today for hospital follow-up  Past Medical History    Past Medical History:  Diagnosis Date   Acute respiratory failure with hypoxia ([place]) 06/2016   CAD (coronary artery disease)    a. Remote PCI of the RCA;  b. 03/2013 Inf STEMI/PCI: LAD 60-70d, RCA 99p ISR(rota/CBA/DES);  c. 09/2014 MV: inf and infsept infarct, EF 35-40%;  d. 09/2014 Cath: patent RCA stent, stable LAD dzs, EF 40%-->Med rx.   CHF (congestive heart failure) ([place])    Diabetes mellitus    sees Dr. Hardin Hanna in [place]   GERD (gastroesophageal reflux disease)    HFrEF (heart failure with reduced ejection fraction) ([place])    Hyperlipidemia    Hypertension    Ischemic cardiomyopathy    a. 09/2014 EF 40% by V gram;  b. 06/2016 Echo: EF 40, diff HK, Gr1 DD, inf HK, no mural thrombus. Mild MR, mildly dil LA, mildly dil RV.    LBBB (left bundle branch block)    Old   Liver mass    Morbid obesity (HCC)    Peripheral neuropathy    Upper GI bleed    Vertigo    Past Surgical History:  Procedure Laterality Date   CARDIAC CATHETERIZATION N/A 10/03/2014   Procedure: Left Heart Cath and Coronary Angiography;  Surgeon: Devin Hampshire, MD;  Location: [place] CV LAB;  Service: Cardiovascular;  Laterality: N/A;   CIRCUMCISION N/A 07/22/2015   Procedure: CIRCUMCISION ADULT;  Surgeon: Devin Espy, MD;  Location: [facility] ORS;  Service: Urology;  Laterality: N/A;   COLONOSCOPY WITH PROPOFOL N/A 01/12/2019   Procedure: COLONOSCOPY WITH PROPOFOL;  Surgeon: Devin Lame, MD;  Location[facility] ENDOSCOPY;  Service:  Endoscopy;  Laterality: N/A;   CORONARY STENT PLACEMENT     ESOPHAGOGASTRODUODENOSCOPY (EGD) WITH PROPOFOL N/A 09/12/2014   Procedure: ESOPHAGOGASTRODUODENOSCOPY (EGD) WITH PROPOFOL;  Surgeon: Devin Lobo, MD;  Location: [facility] ENDOSCOPY;  Service: Endoscopy;  Laterality: N/A;   EXTERNAL FIXATION LEG Left 04/03/2012   Procedure: EXTERNAL FIXATION LEG;  Surgeon: Devin Simmer, MD;  Location: [place];  Service: Orthopedics;  Laterality: Left;   EXTERNAL FIXATION REMOVAL Left 04/14/2012   Procedure: REMOVAL EXTERNAL FIXATION LEG;  Surgeon: Devin Simmer, MD;  Location: [place];  Service: Orthopedics;  Laterality: Left;   IRRIGATION AND DEBRIDEMENT KNEE Left 04/03/2012   Procedure: IRRIGATION AND DEBRIDEMENT KNEE;  Surgeon: Devin Simmer, MD;  Location: [place];  Service: Orthopedics;  Laterality: Left;   LEFT HEART CATHETERIZATION WITH CORONARY ANGIOGRAM N/A 04/05/2013   Procedure: LEFT HEART CATHETERIZATION WITH CORONARY ANGIOGRAM;  Surgeon: Devin Hampshire, MD;  Location: [place] CATH LAB;  Service: Cardiovascular;  Laterality: N/A;   MASS EXCISION N/A 07/22/2015   Procedure: EXCISION PENILE MASS;  Surgeon: Devin Espy, MD;  Location: [facility] ORS;  Service: Urology;  Laterality: N/A;   ORIF TIBIA PLATEAU Left 04/14/2012   Procedure: OPEN REDUCTION INTERNAL FIXATION (ORIF) TIBIAL PLATEAU;  Surgeon: Devin Simmer, MD;  Location: [place];  Service: Orthopedics;  Laterality: Left;   PERCUTANEOUS CORONARY ROTOBLATOR INTERVENTION (PCI-R) N/A 04/06/2013   Procedure: PERCUTANEOUS CORONARY ROTOBLATOR INTERVENTION (PCI-R);  Surgeon: Devin Hampshire, MD;  Location: [facility] CATH LAB;  Service: Cardiovascular;  Laterality: N/A;    Allergies  No Known Allergies  History of Present Illness    Devin Hanna is a 69 y.o. male with a hx of coronary disease, chronic systolic heart failure, left bundle branch block, peripheral neuropathy, prior GI bleed, GERD, DM2, HTN, HLD.  He with last seen while hospitalized.  He has known CAD  with RCA stents in the past and prior MI.  He was hospitalized May 2018 for acute on chronic systolic heart failure.  Echo June 2020 with LVEF 35 to 40% which is stable compared to previous.  He was transition from ace inhibitor to Lantana at that time.  He had hypotension on higher dose of Entresto and had to be decreased.    Seen in clinic 06/29/19 reported feeling overall well.  Was noted to have been diagnosed with liver lesion that was too small to biopsy and is being monitored.  He was admitted 07/30/2019-07/12/2019 for shortness of breath.  Due to kidney injury his Entresto and spironolactone were held and his Lasix was increased to 40 mg twice daily.  Underwent echocardiogram 07/31/2019 with LVEF 25-30%, LV mildly dilated, grade 1 diastolic dysfunction, trivial MR. He was recommended for outpatient stress testing as his EF had dropped to rule out ischemia as origin.  Present today with his daughter.  Reports feeling overall well since discharge.  Long discussion regarding heart failure education.  Discussed 2 L fluid restriction he does typically drink less than 2 L of fluid per day.  We discussed low-sodium diet and he has been much more careful about reading labels since discharge.  He has been weighing daily at home with weights 253 pounds to 257 pounds.  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  Questions when he can go back to work.  He typically works 10-hour shifts 6 days/week as he enjoys working.  Tells me his employer is very flexible with him about him going back to work gradually.   EKGs/Labs/Other Studies Reviewed:   The following studies were reviewed today:  Echo 07/31/19  1. Technically difficult; definity used; severe global reduction in LV  systolic function; grade 1 diastolic dysfunction; mild LVE.   2. Left ventricular ejection fraction, by estimation, is 25 to 30%. The  left ventricle has severely decreased function. The left ventricle  demonstrates global  hypokinesis. The left ventricular internal cavity size  was mildly dilated. Left ventricular  diastolic parameters are consistent with Grade I diastolic dysfunction  (impaired relaxation).   3. Right ventricular systolic function is normal. The right ventricular  size is normal.   4. The mitral valve is normal in structure. Trivial mitral valve  regurgitation. No evidence of mitral stenosis.   5. The aortic valve has an indeterminant number of cusps. Aortic valve  regurgitation is not visualized. No aortic stenosis is present.   6. The inferior vena cava is normal in size with greater than 50%  respiratory variability, suggesting right atrial pressure of 3 mmHg.    EKG:  EKG is ordered today.  The ekg ordered today demonstrates NSR 67 bpmwith LBBB and no acute St/T wave changes.   Recent Labs: 07/30/2019: ALT 36; B Natriuretic Peptide 387.5 08/01/2019: BUN 24; Creatinine, Ser 1.92; Hemoglobin 12.4; Platelets 148; Potassium 3.9; Sodium 138  Recent Lipid Panel    Component Value Date/Time   CHOL 130 04/03/2019 0837   CHOL 103 06/07/2014 0824  TRIG 199.0 (H) 04/03/2019 0837   HDL 45.90 04/03/2019 0837   HDL 38 (L) 06/07/2014 0824   CHOLHDL 3 04/03/2019 0837   VLDL 39.8 04/03/2019 0837   LDLCALC 44 04/03/2019 0837   LDLCALC 59 03/30/2018 0930    Home Medications   Current Meds  Medication Sig   atorvastatin (LIPITOR) 40 MG tablet Take 1 tablet (40 mg total) by mouth daily.   carvedilol (COREG) 6.25 MG tablet Take 1 tablet (6.25 mg total) by mouth 2 (two) times daily with a meal.   Cinnamon 500 MG capsule Take 1,000 mg by mouth 2 (two) times daily with a meal.   clopidogrel (PLAVIX) 75 MG tablet Take 75 mg by mouth daily.   furosemide (LASIX) 40 MG tablet Take 1 tablet (40 mg total) by mouth 2 (two) times daily.   gabapentin (NEURONTIN) 300 MG capsule TAKE 1 CAPSULE BY MOUTH 3 TIMES DAILY   glipiZIDE (GLUCOTROL XL) 10 MG 24 hr tablet Take 10 mg by mouth daily with  breakfast.   [name] 100 MG tablet Take 100 mg by mouth daily.   meloxicam (MOBIC) 15 MG tablet Take 15 mg by mouth daily.   metFORMIN (GLUCOPHAGE-XR) 500 MG 24 hr tablet TAKE TWO TABLETS BY MOUTH TWICE DAILY WITH A MEAL (Patient taking differently: Take 1,000 mg by mouth 2 (two) times daily with a meal. TAKE TWO TABLETS BY MOUTH TWICE DAILY WITH A MEAL)   metFORMIN (GLUCOPHAGE-XR) 500 MG 24 hr tablet Take 1,000 mg by mouth 2 (two) times daily.   Multiple Vitamin (MULTIVITAMIN WITH MINERALS) TABS Take 1 tablet by mouth daily.   nitroGLYCERIN (NITROSTAT) 0.4 MG SL tablet Place 1 tablet (0.4 mg total) under the tongue every 5 (five) minutes as needed for chest pain.   pantoprazole (PROTONIX) 40 MG tablet TAKE ONE TABLET TWICE DAILY      Review of Systems   Review of Systems  Constitutional: Negative for chills, fever and malaise/fatigue.  Cardiovascular: Positive for dyspnea on exertion. Negative for chest pain, leg swelling, near-syncope, orthopnea, palpitations and syncope.  Respiratory: Negative for cough, shortness of breath and wheezing.   Gastrointestinal: Negative for nausea and vomiting.  Neurological: Negative for dizziness, light-headedness and weakness.   All other systems reviewed and are otherwise negative except as noted above.  Physical Exam    VS:  BP (!) 150/84 (BP Location: Left Arm, Patient Position: Sitting, Cuff Size: Large)    Pulse 67    Ht 5' 9"  (1.753 m)    Wt 258 lb 6 oz (117.2 kg)    SpO2 94%    BMI 38.16 kg/m  , BMI Body mass index is 38.16 kg/m. GEN: Well nourished, overweight,  well developed, in no acute distress. HEENT: normal. Neck: Supple, no JVD, carotid bruits, or masses. Cardiac: RRR, no murmurs, rubs, or gallops. No clubbing, cyanosis, edema.  Radials/DP/PT 2+ and equal bilaterally.  Respiratory: Respirations regular and unlabored, clear to auscultation bilaterally. GI: Soft, nontender, nondistended, BS + x 4. MS: No deformity or  atrophy. Skin: Warm and dry, no rash. Neuro:  Strength and sensation are intact. Psych: Normal affect.   Assessment & Plan    1. CAD - No chest pain, pressure, tightness.  He is on Plavix without aspirin due to prior gastric ulcers.  Plan for Lexiscan Myoview to rule out ischemia due to newly worsened LVEF, as below. 2. Chronic systolic heart failure -recent admission with LVEF 25 to 30%.  Previous LVEF 1 year ago 35 to  40%. GDMT includes Coreg 6.25 mg twice daily, Lasix 40 mg twice daily.  His Entresto and spironolactone were held during recent admission due to hyperkalemia and acute kidney injury.  NYHA II with DOE.  Euvolemic and well compensated on exam.  Discussed daily weights and to call for weight gain of 3 pounds overnight or 5 pounds in 1 week.  Reviewed low-sodium diet, less than 2 L fluid restriction.  Offered referral to cardiac rehab which he politely declines.  BMP, BNP today for monitoring to assess ability to resume some of his heart failure therapy. 3. HTN-BP mildly elevated today.  As above, reassess labs to see if we can add back some of his heart failure therapies as this will help with his blood pressure as well. 4. HLD, LDL goal less than 70-03/2019 LDL 44.  Continue present statin. 5. DM2 - Continue to follow with PCP.  Consider addition of [name] for dual heart failure and DM2 benefit.   Disposition: BMP, BNP today.  Follow up in 1 month(s) with Dr. Fletcher Anon or APP  [name], NP 08/08/2019, 2:29 PM

## 2019-08-09 ENCOUNTER — Encounter: Payer: Self-pay | Admitting: Family

## 2019-08-09 ENCOUNTER — Telehealth: Payer: Self-pay

## 2019-08-09 DIAGNOSIS — I2510 Atherosclerotic heart disease of native coronary artery without angina pectoris: Secondary | ICD-10-CM

## 2019-08-09 LAB — BASIC METABOLIC PANEL
BUN/Creatinine Ratio: 14 (ref 10–24)
BUN: 21 mg/dL (ref 8–27)
CO2: 21 mmol/L (ref 20–29)
Calcium: 9.3 mg/dL (ref 8.6–10.2)
Chloride: 102 mmol/L (ref 96–106)
Creatinine, Ser: 1.48 mg/dL — ABNORMAL HIGH (ref 0.76–1.27)
GFR calc Af Amer: 54 mL/min/{1.73_m2} — ABNORMAL LOW (ref >59)
GFR calc non Af Amer: 47 mL/min/{1.73_m2} — ABNORMAL LOW (ref >59)
Glucose: 95 mg/dL (ref 65–99)
Potassium: 4.5 mmol/L (ref 3.5–5.2)
Sodium: 144 mmol/L (ref 134–144)

## 2019-08-09 LAB — BRAIN NATRIURETIC PEPTIDE: BNP: 198.3 pg/mL — ABNORMAL HIGH (ref 0.0–100.0)

## 2019-08-09 MED ORDER — ENTRESTO 24-26 MG PO TABS: 1.0000 | ORAL_TABLET | Freq: Two times a day (BID) | ORAL | 3 refills | Status: DC

## 2019-08-09 NOTE — Telephone Encounter (Signed)
Call to patient to review labs.    Daughter, [name], verbalized understanding and has no further questions at this time.    Advised pt to call for any further questions or concerns.  Orders updated as advised.

## 2019-08-09 NOTE — Telephone Encounter (Signed)
-----   Message from [name], NP sent at 08/09/2019  1:08 PM EDT ----- Renal infection improved back to baseline post hospitalization.  Normal potassium.  Please asked him to resume Entresto 24-26 mg twice daily.  He should have this prescription at home.  Would also like for him to have a repeat BMP on 08/15/2019 when he is here for his [employer].

## 2019-08-09 NOTE — Telephone Encounter (Signed)
Patient daughter returning call for results

## 2019-08-09 NOTE — Telephone Encounter (Signed)
Attempted to call patient. LMTCB 08/09/2019

## 2019-08-15 ENCOUNTER — Ambulatory Visit
Admission: RE | Admit: 2019-08-15 | Discharge: 2019-08-15 | Disposition: A | Discharge status: Home / Self Care | Payer: PPO | Source: Ambulatory Visit | Attending: Family | Admitting: Family

## 2019-08-15 ENCOUNTER — Other Ambulatory Visit
Admission: RE | Admit: 2019-08-15 | Discharge: 2019-08-15 | Disposition: A | Discharge status: Home / Self Care | Payer: PPO | Source: Home / Self Care | Attending: Family | Admitting: Family

## 2019-08-15 ENCOUNTER — Other Ambulatory Visit: Payer: Self-pay

## 2019-08-15 DIAGNOSIS — I2510 Atherosclerotic heart disease of native coronary artery without angina pectoris: Secondary | ICD-10-CM | POA: Insufficient documentation

## 2019-08-15 DIAGNOSIS — K769 Liver disease, unspecified: Secondary | ICD-10-CM

## 2019-08-15 DIAGNOSIS — R0602 Shortness of breath: Secondary | ICD-10-CM | POA: Diagnosis not present

## 2019-08-15 LAB — COMPREHENSIVE METABOLIC PANEL
ALT: 32 U/L (ref 0–44)
AST: 50 U/L — ABNORMAL HIGH (ref 15–41)
Albumin: 3.7 g/dL (ref 3.5–5.0)
Alkaline Phosphatase: 79 U/L (ref 38–126)
Anion gap: 13 (ref 5–15)
BUN: 22 mg/dL (ref 8–23)
CO2: 25 mmol/L (ref 22–32)
Calcium: 8.8 mg/dL — ABNORMAL LOW (ref 8.9–10.3)
Chloride: 101 mmol/L (ref 98–111)
Creatinine, Ser: 1.77 mg/dL — ABNORMAL HIGH (ref 0.61–1.24)
GFR calc Af Amer: 44 mL/min — ABNORMAL LOW (ref >60)
GFR calc non Af Amer: 38 mL/min — ABNORMAL LOW (ref >60)
Glucose, Bld: 141 mg/dL — ABNORMAL HIGH (ref 70–99)
Potassium: 4.5 mmol/L (ref 3.5–5.1)
Sodium: 139 mmol/L (ref 135–145)
Total Bilirubin: 1.2 mg/dL (ref 0.3–1.2)
Total Protein: 6.8 g/dL (ref 6.5–8.1)

## 2019-08-15 LAB — CBC WITH DIFFERENTIAL/PLATELET
Abs Immature Granulocytes: 0.01 10*3/uL (ref 0.00–0.07)
Basophils Absolute: 0 10*3/uL (ref 0.0–0.1)
Basophils Relative: 0 %
Eosinophils Absolute: 0.1 10*3/uL (ref 0.0–0.5)
Eosinophils Relative: 2 %
HCT: 38 % — ABNORMAL LOW (ref 39.0–52.0)
Hemoglobin: 12.7 g/dL — ABNORMAL LOW (ref 13.0–17.0)
Immature Granulocytes: 0 %
Lymphocytes Relative: 21 %
Lymphs Abs: 1.4 10*3/uL (ref 0.7–4.0)
MCH: 31.7 pg (ref 26.0–34.0)
MCHC: 33.4 g/dL (ref 30.0–36.0)
MCV: 94.8 fL (ref 80.0–100.0)
Monocytes Absolute: 0.6 10*3/uL (ref 0.1–1.0)
Monocytes Relative: 8 %
Neutro Abs: 4.7 10*3/uL (ref 1.7–7.7)
Neutrophils Relative %: 69 %
Platelets: 161 10*3/uL (ref 150–400)
RBC: 4.01 MIL/uL — ABNORMAL LOW (ref 4.22–5.81)
RDW: 13.4 % (ref 11.5–15.5)
WBC: 6.8 10*3/uL (ref 4.0–10.5)
nRBC: 0 % (ref 0.0–0.2)

## 2019-08-15 LAB — NM MYOCAR MULTI W/SPECT W/WALL MOTION / EF
LV dias vol: 178 mL (ref 62–150)
LV sys vol: 94 mL
Peak HR: 75 {beats}/min
Rest HR: 59 {beats}/min
TID: 1.33

## 2019-08-15 MED ORDER — TECHNETIUM TC 99M TETROFOSMIN IV KIT
32.6900 | PACK | Freq: Once | INTRAVENOUS | Status: AC | PRN
  Administered 2019-08-15: 32.69 via INTRAVENOUS

## 2019-08-15 MED ORDER — TECHNETIUM TC 99M TETROFOSMIN IV KIT
10.0000 | PACK | Freq: Once | INTRAVENOUS | Status: AC | PRN
  Administered 2019-08-15: 10.88 via INTRAVENOUS

## 2019-08-15 MED ORDER — REGADENOSON 0.4 MG/5ML IV SOLN
0.4000 mg | Freq: Once | INTRAVENOUS | Status: AC
  Administered 2019-08-15: 0.4 mg via INTRAVENOUS

## 2019-08-16 ENCOUNTER — Telehealth: Payer: Self-pay | Admitting: Family

## 2019-08-16 LAB — AFP TUMOR MARKER: AFP, Serum, Tumor Marker: 4.7 ng/mL (ref 0.0–8.3)

## 2019-08-16 NOTE — Progress Notes (Signed)
Called patients daughter [name] and gave all instructions included in the printed letter. Patient will get a Covid test prior to this Friday. [name] will be his ride home and confirmed someone will be with him for 24 hours after the procedure. She will relay all medication instructions as listed on the printed letter.

## 2019-08-16 NOTE — Telephone Encounter (Signed)
Called daughter, [name], to give stress test result. Left VM to request call back.   [name], NP

## 2019-08-16 NOTE — Telephone Encounter (Signed)
Spoke with daughter and gave all instructions as listed in printed letter as well as covid testing requirements. She will be with patient and arrive at 0830 on 08/21/19 for cath lab procedure.

## 2019-08-16 NOTE — Telephone Encounter (Signed)
Cardiac catheterization orders placed. Consent previously confirmed with patient via telephone.   [name], NP

## 2019-08-16 NOTE — Progress Notes (Signed)
[employer] with area of concern of new ischemia in apical inferior portion of heart. In setting of newly reduced LVEF, plan for [facility] with Dr. Fletcher Anon. Orders placed. Consent discussed with patient via telephone. See telephone encounter 08/16/19 for additional detail. The patient understands that risks include but are not limited to stroke (1 in 1000), death (1 in 36), kidney failure [usually temporary] (1 in 500), bleeding (1 in 200), allergic reaction [possibly serious] (1 in 200), and agrees to proceed.  [name], NP

## 2019-08-16 NOTE — Addendum Note (Signed)
Addended by: [name] on: 08/16/2019 04:30 PM   Modules accepted: Orders, SmartSet

## 2019-08-16 NOTE — Telephone Encounter (Signed)
Spoke with [name] (his daughter) via phone. Reviewed stress test which shows prior scar, but apical inferior defect more severe than previous. Discussed indication for cardiac catheterization. She verbalized understanding and was appreciative of the call.   Called and spoke with Mr. Boutwell. Reviewed stress test results. He reports no chest pain, pressure, tightness. 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 Lab work yesterday showed overall stable renal function, stable Hb. He reports no noticed changes since resuming Entresto.   He is agreeable to be scheduled for cardiac catheterization with Dr. Fletcher Anon 08/21/19 or 09/04/19. We discussed risks and benefits of cardiac catheterization. The patient understands that risks include but are not limited to stroke (1 in 1000), death (1 in 68), kidney failure [usually temporary] (1 in 500), bleeding (1 in 200), allergic reaction [possibly serious] (1 in 200), and agrees to proceed. We will need to utilize contrast carefully due to his renal function.   Will route to triage team for assistance in scheduling.   [name], NP

## 2019-08-17 ENCOUNTER — Other Ambulatory Visit: Payer: Self-pay

## 2019-08-17 ENCOUNTER — Other Ambulatory Visit
Admission: RE | Admit: 2019-08-17 | Discharge: 2019-08-17 | Disposition: A | Discharge status: Home / Self Care | Payer: PPO | Source: Ambulatory Visit | Attending: Cardiovascular Disease | Admitting: Cardiovascular Disease

## 2019-08-17 DIAGNOSIS — Z01812 Encounter for preprocedural laboratory examination: Secondary | ICD-10-CM | POA: Diagnosis not present

## 2019-08-17 DIAGNOSIS — Z20822 Contact with and (suspected) exposure to covid-19: Secondary | ICD-10-CM | POA: Diagnosis not present

## 2019-08-17 LAB — SARS CORONAVIRUS 2 (TAT 6-24 HRS): SARS Coronavirus 2: NEGATIVE

## 2019-08-21 ENCOUNTER — Other Ambulatory Visit: Payer: Self-pay | Admitting: Family Medicine

## 2019-08-21 ENCOUNTER — Ambulatory Visit
Admission: RE | Admit: 2019-08-21 | Discharge: 2019-08-21 | Disposition: A | Discharge status: Home / Self Care | Payer: PPO | Attending: Cardiovascular Disease | Admitting: Cardiovascular Disease

## 2019-08-21 ENCOUNTER — Other Ambulatory Visit: Payer: Self-pay | Admitting: Podiatry

## 2019-08-21 ENCOUNTER — Encounter
Admission: RE | Disposition: A | Discharge status: Home / Self Care | Payer: Self-pay | Source: Home / Self Care | Attending: Cardiovascular Disease

## 2019-08-21 ENCOUNTER — Telehealth: Payer: Self-pay | Admitting: Family Medicine

## 2019-08-21 ENCOUNTER — Other Ambulatory Visit: Payer: Self-pay

## 2019-08-21 ENCOUNTER — Encounter: Payer: Self-pay | Admitting: Cardiovascular Disease

## 2019-08-21 DIAGNOSIS — Z791 Long term (current) use of non-steroidal anti-inflammatories (NSAID): Secondary | ICD-10-CM | POA: Insufficient documentation

## 2019-08-21 DIAGNOSIS — R9439 Abnormal result of other cardiovascular function study: Secondary | ICD-10-CM | POA: Diagnosis not present

## 2019-08-21 DIAGNOSIS — I2584 Coronary atherosclerosis due to calcified coronary lesion: Secondary | ICD-10-CM | POA: Diagnosis not present

## 2019-08-21 DIAGNOSIS — I447 Left bundle-branch block, unspecified: Secondary | ICD-10-CM | POA: Insufficient documentation

## 2019-08-21 DIAGNOSIS — E1142 Type 2 diabetes mellitus with diabetic polyneuropathy: Secondary | ICD-10-CM | POA: Insufficient documentation

## 2019-08-21 DIAGNOSIS — I5022 Chronic systolic (congestive) heart failure: Secondary | ICD-10-CM | POA: Insufficient documentation

## 2019-08-21 DIAGNOSIS — N1832 Chronic kidney disease, stage 3b: Secondary | ICD-10-CM

## 2019-08-21 DIAGNOSIS — N189 Chronic kidney disease, unspecified: Secondary | ICD-10-CM | POA: Diagnosis not present

## 2019-08-21 DIAGNOSIS — Z7902 Long term (current) use of antithrombotics/antiplatelets: Secondary | ICD-10-CM | POA: Diagnosis not present

## 2019-08-21 DIAGNOSIS — Z7984 Long term (current) use of oral hypoglycemic drugs: Secondary | ICD-10-CM | POA: Diagnosis not present

## 2019-08-21 DIAGNOSIS — I272 Pulmonary hypertension, unspecified: Secondary | ICD-10-CM | POA: Insufficient documentation

## 2019-08-21 DIAGNOSIS — I5023 Acute on chronic systolic (congestive) heart failure: Secondary | ICD-10-CM | POA: Diagnosis not present

## 2019-08-21 DIAGNOSIS — K219 Gastro-esophageal reflux disease without esophagitis: Secondary | ICD-10-CM | POA: Insufficient documentation

## 2019-08-21 DIAGNOSIS — Z6838 Body mass index (BMI) 38.0-38.9, adult: Secondary | ICD-10-CM | POA: Insufficient documentation

## 2019-08-21 DIAGNOSIS — Z79899 Other long term (current) drug therapy: Secondary | ICD-10-CM | POA: Insufficient documentation

## 2019-08-21 DIAGNOSIS — I255 Ischemic cardiomyopathy: Secondary | ICD-10-CM | POA: Diagnosis not present

## 2019-08-21 DIAGNOSIS — E1122 Type 2 diabetes mellitus with diabetic chronic kidney disease: Secondary | ICD-10-CM | POA: Insufficient documentation

## 2019-08-21 DIAGNOSIS — I130 Hypertensive heart and chronic kidney disease with heart failure and stage 1 through stage 4 chronic kidney disease, or unspecified chronic kidney disease: Secondary | ICD-10-CM | POA: Insufficient documentation

## 2019-08-21 DIAGNOSIS — E785 Hyperlipidemia, unspecified: Secondary | ICD-10-CM | POA: Diagnosis not present

## 2019-08-21 DIAGNOSIS — I252 Old myocardial infarction: Secondary | ICD-10-CM | POA: Insufficient documentation

## 2019-08-21 DIAGNOSIS — Z955 Presence of coronary angioplasty implant and graft: Secondary | ICD-10-CM | POA: Insufficient documentation

## 2019-08-21 DIAGNOSIS — I5020 Unspecified systolic (congestive) heart failure: Secondary | ICD-10-CM

## 2019-08-21 DIAGNOSIS — I5042 Chronic combined systolic (congestive) and diastolic (congestive) heart failure: Secondary | ICD-10-CM

## 2019-08-21 DIAGNOSIS — I2510 Atherosclerotic heart disease of native coronary artery without angina pectoris: Secondary | ICD-10-CM | POA: Diagnosis not present

## 2019-08-21 HISTORY — PX: RIGHT/LEFT HEART CATH AND CORONARY ANGIOGRAPHY: CATH118266

## 2019-08-21 LAB — GLUCOSE, CAPILLARY: Glucose-Capillary: 108 mg/dL — ABNORMAL HIGH (ref 70–99)

## 2019-08-21 SURGERY — RIGHT/LEFT HEART CATH AND CORONARY ANGIOGRAPHY
Anesthesia: Moderate Sedation

## 2019-08-21 MED ORDER — LIDOCAINE HCL (PF) 1 % IJ SOLN
INTRAMUSCULAR | Status: AC
  Filled 2019-08-21: qty 30

## 2019-08-21 MED ORDER — HEPARIN (PORCINE) IN NACL 1000-0.9 UT/500ML-% IV SOLN
INTRAVENOUS | Status: DC | PRN
  Administered 2019-08-21: 500 mL

## 2019-08-21 MED ORDER — VERAPAMIL HCL 2.5 MG/ML IV SOLN
INTRAVENOUS | Status: DC | PRN
  Administered 2019-08-21: 2.5 mg via INTRA_ARTERIAL

## 2019-08-21 MED ORDER — HEPARIN (PORCINE) IN NACL 1000-0.9 UT/500ML-% IV SOLN
INTRAVENOUS | Status: AC
  Filled 2019-08-21: qty 1000

## 2019-08-21 MED ORDER — SODIUM CHLORIDE 0.9% FLUSH: 3.0000 mL | INTRAVENOUS | Status: DC | PRN

## 2019-08-21 MED ORDER — HEPARIN SODIUM (PORCINE) 1000 UNIT/ML IJ SOLN
INTRAMUSCULAR | Status: AC
  Filled 2019-08-21: qty 1

## 2019-08-21 MED ORDER — MIDAZOLAM HCL 2 MG/2ML IJ SOLN
INTRAMUSCULAR | Status: DC | PRN
  Administered 2019-08-21: 1 mg via INTRAVENOUS

## 2019-08-21 MED ORDER — FENTANYL CITRATE (PF) 100 MCG/2ML IJ SOLN
INTRAMUSCULAR | Status: DC | PRN
  Administered 2019-08-21: 50 ug via INTRAVENOUS

## 2019-08-21 MED ORDER — ASPIRIN 81 MG PO CHEW
CHEWABLE_TABLET | ORAL | Status: AC
  Filled 2019-08-21: qty 1

## 2019-08-21 MED ORDER — SODIUM CHLORIDE 0.9 % IV SOLN: INTRAVENOUS | Status: DC

## 2019-08-21 MED ORDER — SODIUM CHLORIDE 0.9% FLUSH: 3.0000 mL | Freq: Two times a day (BID) | INTRAVENOUS | Status: DC

## 2019-08-21 MED ORDER — SODIUM CHLORIDE 0.9 % IV SOLN: 250.0000 mL | INTRAVENOUS | Status: DC | PRN

## 2019-08-21 MED ORDER — HEPARIN SODIUM (PORCINE) 1000 UNIT/ML IJ SOLN
INTRAMUSCULAR | Status: DC | PRN
  Administered 2019-08-21: 5000 [IU] via INTRAVENOUS

## 2019-08-21 MED ORDER — IOHEXOL 300 MG/ML  SOLN
INTRAMUSCULAR | Status: DC | PRN
  Administered 2019-08-21: 55 mL

## 2019-08-21 MED ORDER — ASPIRIN 81 MG PO CHEW
81.0000 mg | CHEWABLE_TABLET | ORAL | Status: AC
  Administered 2019-08-21: 81 mg via ORAL

## 2019-08-21 MED ORDER — MIDAZOLAM HCL 2 MG/2ML IJ SOLN
INTRAMUSCULAR | Status: AC
  Filled 2019-08-21: qty 2

## 2019-08-21 MED ORDER — FENTANYL CITRATE (PF) 100 MCG/2ML IJ SOLN
INTRAMUSCULAR | Status: AC
  Filled 2019-08-21: qty 2

## 2019-08-21 SURGICAL SUPPLY — 11 items
CATH BALLN WEDGE 5F 110CM (CATHETERS) ×3 IMPLANT
CATH INFINITI 5 FR JL3.5 (CATHETERS) ×3 IMPLANT
CATH INFINITI 5FR JK (CATHETERS) ×3 IMPLANT
CATH INFINITI JR4 5F (CATHETERS) ×3 IMPLANT
DEVICE RAD TR BAND REGULAR (VASCULAR PRODUCTS) ×3 IMPLANT
GLIDESHEATH SLEND SS 6F .021 (SHEATH) ×3 IMPLANT
GUIDEWIRE INQWIRE 1.5J.035X260 (WIRE) ×1 IMPLANT
INQWIRE 1.5J .035X260CM (WIRE) ×3
KIT MANI 3VAL PERCEP (MISCELLANEOUS) ×3 IMPLANT
PACK CARDIAC CATH (CUSTOM PROCEDURE TRAY) ×3 IMPLANT
SHEATH GLIDE SLENDER 4/5FR (SHEATH) ×3 IMPLANT

## 2019-08-21 NOTE — Interval H&P Note (Signed)
Cath Lab Visit (complete for each Cath Lab visit)  Clinical Evaluation Leading to the Procedure:   ACS: No.  Non-ACS:    Anginal Classification: CCS III  Anti-ischemic medical therapy: Maximal Therapy (2 or more classes of medications)  Non-Invasive Test Results: High-risk stress test findings: cardiac mortality >3%/year  Prior CABG: No previous CABG      History and Physical Interval Note:  08/21/2019 10:26 AM  Devin Hanna  has presented today for surgery, with the diagnosis of RT LT Heart Cath   CAD  HF with reduced EF  abnormal cardiovascular stress test.  The various methods of treatment have been discussed with the patient and family. After consideration of risks, benefits and other options for treatment, the patient has consented to  Procedure(s): RIGHT/LEFT HEART CATH AND CORONARY ANGIOGRAPHY (N/A) as a surgical intervention.  The patient's history has been reviewed, patient examined, no change in status, stable for surgery.  I have reviewed the patient's chart and labs.  Questions were answered to the patient's satisfaction.     [name]

## 2019-08-21 NOTE — Telephone Encounter (Signed)
-----   Message from [name], MD sent at 08/21/2019 11:28 AM EDT ----- Devin Hanna, See cath note from today.  I recommend stopping Metformin given worsening heart failure and chronic kidney disease and adding [name] instead.  Thanks

## 2019-08-21 NOTE — Telephone Encounter (Signed)
I called and spoke with the patient's daughter and they are okay with you sending Wilder Glade to the pharmacy for the patient.  Everhett Bozard,cma

## 2019-08-21 NOTE — Telephone Encounter (Signed)
Please call the patient.  Please let him know that I heard from his cardiologist today.  They recommended that he stop Metformin and we placed him on farxiga for his diabetes as it will help with his kidney function and heart failure.  Once you speak with him I can send the [place] in.

## 2019-08-22 ENCOUNTER — Other Ambulatory Visit: Payer: Self-pay | Admitting: Family Medicine

## 2019-08-22 MED ORDER — DAPAGLIFLOZIN PROPANEDIOL 10 MG PO TABS: 10.0000 mg | ORAL_TABLET | Freq: Every day | ORAL | 3 refills | Status: DC

## 2019-08-22 NOTE — Telephone Encounter (Signed)
Pt would like to see if there is a discount card for the [place]? Please call back

## 2019-08-22 NOTE — Telephone Encounter (Signed)
I called and spoke with the patient's daughter and informed  her that the medication was sent. I informed her that if the patient experienced and illness with vomiting, diarrhea, or poor oral intake he should stop[ the medication until resolved and then start up again. She understood.  Artavious Trebilcock,cma

## 2019-08-22 NOTE — Addendum Note (Signed)
Addended by: [name] Alucard Fearnow G on: 08/22/2019 01:55 PM   Modules accepted: Orders

## 2019-08-22 NOTE — Telephone Encounter (Signed)
Please let the patient know that I sent the farxiga in for him.  If it is too expensive they should let us know.  He should stop his Metformin.  He will need labs in about a month.  Orders placed.  If the patient develops an illness with vomiting, diarrhea, or poor oral intake he should stop the farxiga while the symptoms resolve and once they are gone he can restart it.

## 2019-08-30 ENCOUNTER — Telehealth: Payer: Self-pay

## 2019-08-30 DIAGNOSIS — I509 Heart failure, unspecified: Secondary | ICD-10-CM

## 2019-08-30 NOTE — Telephone Encounter (Signed)
I could refer him to [employer] to see if he would qualify for assistance. Would they be willing to do that?

## 2019-08-30 NOTE — Telephone Encounter (Signed)
Yes you can send to [employer].  Jarred Purtee,cma

## 2019-08-30 NOTE — Telephone Encounter (Signed)
I received a fax that the medication Wilder Glade was approved for the patient I informed the patient of this.  Ixchel Duck,cma

## 2019-08-31 ENCOUNTER — Telehealth: Payer: Self-pay | Admitting: Family Medicine

## 2019-08-31 NOTE — Chronic Care Management (AMB) (Signed)
  Chronic Care Management   Note  08/31/2019 Name: CORMAC WINT MRN: [id number] DOB: September 10, 1950  Juvencio Verdi Yaeger is a 69 y.o. year old male who is a primary care patient of [name], Angela Adam, MD. I reached out to [place] by phone today in response to a referral sent by Mr. Jenny Reichmann Stachnik's PCP, Dr. [name]     Mr. Ciullo's daughter [name]  was given information about Chronic Care Management services today including:  1. CCM service includes personalized support from designated clinical staff supervised by his physician, including individualized plan of care and coordination with other care providers 2. 24/7 contact phone numbers for assistance for urgent and routine care needs. 3. Service will only be billed when office clinical staff spend 20 minutes or more in a month to coordinate care. 4. Only one practitioner may furnish and bill the service in a calendar month. 5. The patient may stop CCM services at any time (effective at the end of the month) by phone call to the office staff. 6. The patient will be responsible for cost sharing (co-pay) of up to 20% of the service fee (after annual deductible is met).  Patient's daughter [name] agreed to services and verbal consent obtained.   Follow up plan: Telephone appointment with care management team member scheduled for:09/19/2019  Noreene Larsson, [place], [place], [place] [address] Direct Dial: [phone] [email or web address]: [email or web address]

## 2019-09-04 ENCOUNTER — Ambulatory Visit (INDEPENDENT_AMBULATORY_CARE_PROVIDER_SITE_OTHER): Payer: PPO | Admitting: Pharmacist

## 2019-09-04 DIAGNOSIS — I2510 Atherosclerotic heart disease of native coronary artery without angina pectoris: Secondary | ICD-10-CM

## 2019-09-04 DIAGNOSIS — I10 Essential (primary) hypertension: Secondary | ICD-10-CM | POA: Diagnosis not present

## 2019-09-04 DIAGNOSIS — E1142 Type 2 diabetes mellitus with diabetic polyneuropathy: Secondary | ICD-10-CM

## 2019-09-04 DIAGNOSIS — K279 Peptic ulcer, site unspecified, unspecified as acute or chronic, without hemorrhage or perforation: Secondary | ICD-10-CM

## 2019-09-04 DIAGNOSIS — I5022 Chronic systolic (congestive) heart failure: Secondary | ICD-10-CM

## 2019-09-04 MED ORDER — SITAGLIPTIN PHOSPHATE 50 MG PO TABS: 50.0000 mg | ORAL_TABLET | Freq: Every day | ORAL | 1 refills | Status: DC

## 2019-09-04 NOTE — Patient Instructions (Addendum)
Mr. Robey,   It was great talking with you today! We are going to apply for assistance from the drug manufacturers for [place], [name], and [place]. Be on the lookout for a letter from my pharmacy technician, Sharee Pimple, with the paperwork that you need to fill out.   We will need proof of your income. 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 You should have a 2021 Awards Letter or a 2020 1099 Form (that you would have submitted with your 2020 taxes). Please also send [place] a copy of your 2020 tax return, just in case a program requires that. We will also include a letter explaining why your income is different from your 2020 tax return. Please sign that, in case a company needs that as well.   For now, please decrease [name] to 50 mg daily (cut the 100 mg tablet in half). This is because of your kidney function. Moving forward, I want [place] to think about switching you to a cousin of [name] that would give [place] more benefit. This would be either a pill called Rybelsus or a weekly injection called Ozempic. This switch would also likely allow [place] to reduce your dose of, or even eliminate, the glipizide. Since glipizide increases your risk of low blood sugars, this change would end up being safer for you in the long run. We'll talk more about this once we secure you a supply of your current medications.   In the meantime, let's have you start checking your blood sugars more regularly. Please check your sugar 1) fasting, before you eat breakfast and 2) about 2 hours after the largest meal of your day. Our goal fasting sugar is <130 and goal 2 hour after meal sugar <180 to target a goal A1c of <7%. I've included a Glucose Log for you to write down these readings for [place] to review at our next phone call.   Here is how you should be taking your medications:  Morning: Carvedilol 6.25 mg (heart, blood pressure, heart rate) Entresto  24/26 mg (heart, blood pressure) Furosemide 40 mg (fluid) Gabapentin 300 mg (nerve pain) [name] 10 mg daily (heart failure, diabetes, kidney disease) - when we get it for you [name] 50 mg daily (diabetes) Pantoprazole 40 mg daily (acid reflux)  Midday: Gabapentin 300 mg (nerve pain) Furosemide 40 mg (fluid)  Evening: Carvedilol 6.25 mg (heart, blood pressure, heart rate) Entresto 24/26 mg (heart, blood pressure) Atorvastatin 40 mg daily Clopidogrel 75 mg daily   Pantoprazole 40 mg daily (acid reflux)  Please ask your daughter to give me a call with any questions about anything above!  [name], [name], [place], CPP Clinical Pharmacist [place] [phone]   Visit Information  Goals Addressed              This Visit's Progress     Patient Stated   .  [name] "I need help w/ my medications" (pt-stated)        CARE PLAN ENTRY (see longitudinal plan of care for additional care plan information)  Current Barriers:  . Social, financial, community barriers:  o Reports significant concern affording Wilder Glade, especially since costs of [name] and [name] have put him into the Medicare Coverage gap o Patient's only income right now is SSI; he has not been working, and is unlikely to return to work, d/t recent exacerbations of his medical conditions .  Diabetes: controlled, but not optimally managed; complicated by chronic medical conditions including HFrEF, CAD, HLD,  most recent A1c 6.1% . Most recent eGFR: ~38 mL/min . Current antihyperglycemic regimen: [name] 10 mg, glipizide XL 10 mg daily, [name] 100 mg daily o Metformin recently d/c d/t worsening renal fx and HF  . Reports no episodes of low blood sugars  . Current meal patterns o Breakfast: 1 egg, cheerrios, 1 cup of decaf coffee o Lunch: lettuce and tomato sandwich, wheat bread; water; sometimes vegetables  o Snacks: crackers, fruits  o Dinner:  hamburger . Current blood glucose readings:  o Since stopping metformin: Fastings: ~130-140s, sometimes up to 150s . Cardiovascular risk reduction (follows w/ Dr. Arida; most recent EF 25-30%; spiro held d/t renal fx; hx MI w/ RCA stenting) o Current hypertensive regimen: furosemide 40 mg BID, carvedilol 6.25 mg BID, Entresto 24/26 mg BID, (farxiga 10 mg daily) o Current hyperlipidemia regimen: atorvastatin 40 mg daily, last LDL very well controlled <55  o Current antiplatelet regimen: clopidogrel 75 mg daily  . GERD: pantoprazole 40 mg BID . Supplements: daily MVI, cinnamon . Pain: meloxicam 15 mg daily; prescribed by Dr. Brent Evans, podiatry  Pharmacist Clinical Goal(s):  . Over the next 90 days, patient will work with [name] and primary care provider to address optimized medication management  Interventions: . Comprehensive medication review performed, medication list updated in electronic medical record . Inter-disciplinary care team collaboration (see longitudinal plan of care) . Reviewed income. W/ just SSI, patient qualifies for patient assistance. He does NOT qualify for Medicare Extra Help. Will collaborate w/ CPhT to mail patient his portions of assistance applications for [name] ([employer]), Entresto (Novartis), and [name] ([name]). Will collaborate w/ PCP for application signatures. Discussed income; as patient is not working, ideally would submit just proof of SSI with 2021 Awards Letter or 2020 1099 form. However, will also ask patient to return copy of 2020 tax return and sign letter explaining that he is unable to work d/t medical conditions to submit if needed.  . Discussed current DM regimen. Ideally, would reduce need for glipizide d/t risk of hypoglycemia, and would prefer GLP1 to DPP4 given ASCVD risk reduction. Will avoid adjusting medications at this time until financial issues resolved, but will consider adding GLP1 and d/c DPP4 in the future.  . Reviewed renal fx.  Reduce [name] to 50 mg daily, given eGFR <45 mL/min. Considered switching to Tradjenta to avoid renal dose adjustments, but will defer this to avoid confusion for patient, given goal to switch out for GLP1 in the future. . Discussed goal A1c, goal fasting, and goal 2 hour post prandial glucose. Encouraged patient to continue to check fastings, and add 2 hour post prandial glucose checks after the biggest meal of his day. He verbalized understanding . Concern for continued NSAID use, given patient's hx CV disease, CKD, and hx GI bleed. Will discuss w/ PCP. No formal dx of arthritis. Would at least recommend dose reduction to 7.5 mg daily, if not elimination  Patient Self Care Activities:  . Patient will check blood glucose BID, document, and provide at future appointments . Patient will take medications as prescribed . Patient will contact provider with any episodes of hypoglycemia . Patient will report any questions or concerns to provider   Initial goal documentation        Mr. Tagg was given information about Chronic Care Management services today including:  1. CCM service includes personalized support from designated clinical staff supervised by   his physician, including individualized plan of care and coordination with other care providers 2. 24/7 contact phone numbers for assistance for urgent and routine care needs. 3. Service will only be billed when office clinical staff spend 20 minutes or more in a month to coordinate care. 4. Only one practitioner may furnish and bill the service in a calendar month. 5. The patient may stop CCM services at any time (effective at the end of the month) by phone call to the office staff. 6. The patient will be responsible for cost sharing (co-pay) of up to 20% of the service fee (after annual deductible is met).  Patient agreed to services and verbal consent obtained.   The patient verbalized understanding of instructions provided today and  agreed to receive a mailed copy of patient instruction and/or educational materials.  Plan: - Scheduled f/u cal in ~ 4 weeks  Catie Travis, [name], BCACP, CPP Clinical Pharmacist [facility] [place] [facility] [phone]   

## 2019-09-04 NOTE — Chronic Care Management (AMB) (Signed)
**Note Devin-Identified via Obfuscation** Chronic Care Management   Note  09/04/2019 Name: Devin Hanna MRN: [id number] DOB: February 13, 1950   Subjective:  Devin Hanna is a 69 y.o. year old male who is a primary care patient of Devin Hanna, Devin Adam, MD. The CCM team was consulted for assistance with chronic disease management and care coordination needs.     [name] was given information about Chronic Care Management services today including:  1. CCM service includes personalized support from designated clinical staff supervised by his physician, including individualized plan of care and coordination with other care providers 2. 24/7 contact phone numbers for assistance for urgent and routine care needs. 3. Service will only be billed when office clinical staff spend 20 minutes or more in a month to coordinate care. 4. Only one practitioner may furnish and bill the service in a calendar month. 5. The patient may stop CCM services at any time (effective at the end of the month) by phone call to the office staff. 6. The patient will be responsible for cost sharing (co-pay) of up to 20% of the service fee (after annual deductible is met).  Patient agreed to services and verbal consent obtained.   Contacted patient for medication management and medication access review today.   Review of patient status, including review of consultants reports, laboratory and other test data, was performed as part of comprehensive evaluation and provision of chronic care management services.   SDOH (Social Determinants of Health) assessments and interventions performed:  SDOH Interventions     Most Recent Value  SDOH Interventions  Financial Strain Interventions Other (Comment)  [medication assistance]       Objective:  Lab Results  Component Value Date   CREATININE 1.77 (H) 08/15/2019   CREATININE 1.48 (H) 08/08/2019   CREATININE 1.92 (H) 08/01/2019    Lab Results  Component Value Date   HGBA1C 6.1 04/03/2019       Component  Value Date/Time   CHOL 130 04/03/2019 0837   CHOL 103 06/07/2014 0824   TRIG 199.0 (H) 04/03/2019 0837   HDL 45.90 04/03/2019 0837   HDL 38 (L) 06/07/2014 0824   CHOLHDL 3 04/03/2019 0837   VLDL 39.8 04/03/2019 0837   LDLCALC 44 04/03/2019 0837   LDLCALC 59 03/30/2018 0930    Clinical ASCVD: Yes  The 10-year ASCVD risk score Devin Hanna DC Jr., et al., 2013) is: 25.6%   Values used to calculate the score:     Age: 1 years     Sex: Male     Is Non-Hispanic African American: No     Diabetic: Yes     Tobacco smoker: No     Systolic Blood Pressure: 163 mmHg     Is BP treated: Yes     HDL Cholesterol: 45.9 mg/dL     Total Cholesterol: 130 mg/dL    BP Readings from Last 3 Encounters:  08/21/19 (!) 107/48  08/08/19 (!) 150/84  08/01/19 113/61    No Known Allergies  Medications Reviewed Today    Reviewed by Devin Hanna, Devin Hanna (Pharmacist) on 09/04/19 at 1108  Med List Status: <None>  Medication Order Taking? Sig Documenting Provider Last Dose Status Informant  atorvastatin (LIPITOR) 40 MG tablet [id number] Yes Take 1 tablet (40 mg total) by mouth daily. Devin Gianotti, NP Taking Active Family Member  carvedilol (COREG) 6.25 MG tablet [id number] Yes Take 1 tablet (6.25 mg total) by mouth 2 (two) times daily with a meal. Devin Ishikawa, MD Taking Active Family  Member  CINNAMON PO [id number] Yes Take 1,000 mg by mouth in the morning and at bedtime. [provider] Taking Active Family Member  clopidogrel (PLAVIX) 75 MG tablet [id number] Yes Take 75 mg by mouth daily. [provider] Taking Active Family Member  dapagliflozin propanediol ([name]) 10 MG TABS tablet [id number] No Take 1 tablet (10 mg total) by mouth daily before breakfast.  Patient not taking: Reported on 09/04/2019   Devin Haven, MD Not Taking Active   furosemide (LASIX) 40 MG tablet [id number] Yes Take 1 tablet (40 mg total) by mouth 2 (two) times daily. Devin Ishikawa, MD  Taking Active Family Member  gabapentin (NEURONTIN) 300 MG capsule [id number] Yes TAKE 1 CAPSULE BY MOUTH 3 TIMES DAILY  Patient taking differently: Take 300 mg by mouth 3 (three) times daily.    Devin Haven, MD Taking Active Family Member  glipiZIDE (GLUCOTROL XL) 10 MG 24 hr tablet [id number] Yes Take 10 mg by mouth daily with breakfast. [provider] Taking Active Family Member  [name] 100 MG tablet [id number] Yes Take 100 mg by mouth daily. [provider] Taking Active Family Member  meloxicam (MOBIC) 15 MG tablet [id number] Yes Take 15 mg by mouth daily. [provider] Taking Active Family Member  Multiple Vitamin (MULTIVITAMIN WITH MINERALS) TABS [id number] Yes Take 1 tablet by mouth daily. Multivitamin for Men 50+ [provider] Taking Active Family Member  nitroGLYCERIN (NITROSTAT) 0.4 MG SL tablet [id number]  Place 1 tablet (0.4 mg total) under the tongue every 5 (five) minutes as needed for chest pain.  Patient not taking: Reported on 08/21/2019   Devin Haven, MD  Active Family Member  pantoprazole (PROTONIX) 40 MG tablet [id number] Yes TAKE ONE TABLET TWICE DAILY  Patient taking differently: Take 40 mg by mouth 2 (two) times daily.    Devin Hampshire, MD Taking Active Family Member  sacubitril-valsartan [facility]) 24-26 [place] [id number] Yes Take 1 tablet by mouth 2 (two) times daily. Devin Dubonnet, NP Taking Active Family Member           Assessment:   Goals Addressed              This Visit's Progress     Patient Stated   .  [name] "I need help w/ my medications" (pt-stated)        CARE PLAN ENTRY (see longitudinal plan of care for additional care plan information)  Current Barriers:  . Social, financial, community barriers:  o Reports significant concern affording Wilder Glade, especially since costs of [name] and [name] have put him into the Medicare Coverage gap o Patient's only income right now is SSI; he has not  been working, and is unlikely to return to work, d/t recent exacerbations of his medical conditions . Diabetes: controlled, but not optimally managed; complicated by chronic medical conditions including HFrEF, CAD, HLD,  most recent A1c 6.1% . Most recent eGFR: ~38 mL/min . Current antihyperglycemic regimen: [name] 10 mg, glipizide XL 10 mg daily, [name] 100 mg daily o Metformin recently d/c d/t worsening renal fx and HF  . Reports no episodes of low blood sugars  . Current meal patterns o Breakfast: 1 egg, cheerrios, 1 cup of decaf coffee o Lunch: lettuce and tomato sandwich, wheat bread; water; sometimes vegetables  o Snacks: crackers, fruits  o Dinner: hamburger . Current blood glucose readings:  o Since stopping metformin: Fastings: ~130-140s, sometimes up to 150s . Cardiovascular risk reduction (follows w/ Devin Hanna. Fletcher Anon; most  recent EF 25-30%; spiro held d/t renal fx; hx MI w/ RCA stenting) o Current hypertensive regimen: furosemide 40 mg BID, carvedilol 6.25 mg BID, Entresto 24/26 mg BID, (farxiga 10 mg daily) o Current hyperlipidemia regimen: atorvastatin 40 mg daily, last LDL very well controlled <55  o Current antiplatelet regimen: clopidogrel 75 mg daily  . GERD: pantoprazole 40 mg BID . Supplements: daily MVI, cinnamon . Pain: meloxicam 15 mg daily; prescribed by Devin Hanna. Daylene Katayama, podiatry  Pharmacist Clinical Goal(s):  [place] Kitchen Over the next 90 days, patient will work with [name] and primary care provider to address optimized medication management  Interventions: . Comprehensive medication review performed, medication list updated in electronic medical record . Inter-disciplinary care team collaboration (see longitudinal plan of care) . Reviewed income. W/ just SSI, patient qualifies for patient assistance. He does NOT qualify for Medicare Extra Help. Will collaborate w/ CPhT to mail patient his portions of assistance applications for [place] [employer]), [employer] ([name]), and  [employer] ([name]). Will collaborate w/ PCP for application signatures. Discussed income; as patient is not working, ideally would submit just proof of SSI with 2021 Awards Letter or 2020 1099 form. However, will also ask patient to return copy of 2020 tax return and sign letter explaining that he is unable to work d/t medical conditions to submit if needed.  . Discussed current DM regimen. Ideally, would reduce need for glipizide d/t risk of hypoglycemia, and would prefer GLP1 to DPP4 given ASCVD risk reduction. Will avoid adjusting medications at this time until financial issues resolved, but will consider adding GLP1 and d/c DPP4 in the future.  . Reviewed renal fx. Reduce [name] to 50 mg daily, given eGFR <45 mL/min. Considered switching to Tradjenta to avoid renal dose adjustments, but will defer this to avoid confusion for patient, given goal to switch out for GLP1 in the future. . Discussed goal A1c, goal fasting, and goal 2 hour post prandial glucose. Encouraged patient to continue to check fastings, and add 2 hour post prandial glucose checks after the biggest meal of his day. He verbalized understanding . Concern for continued NSAID use, given patient's hx CV disease, CKD, and hx GI bleed. Will discuss w/ PCP. No formal dx of arthritis. Would at least recommend dose reduction to 7.5 mg daily, if not elimination  Patient Self Care Activities:  . Patient will check blood glucose BID, document, and provide at future appointments . Patient will take medications as prescribed . Patient will contact provider with any episodes of hypoglycemia . Patient will report any questions or concerns to provider   Initial goal documentation        Plan: - Scheduled f/u cal in ~ 4 weeks  [name], [name], [place], [place] Pharmacist [place] [phone]

## 2019-09-05 ENCOUNTER — Ambulatory Visit: Payer: Self-pay | Admitting: Pharmacist

## 2019-09-05 ENCOUNTER — Other Ambulatory Visit: Payer: Self-pay | Admitting: Pharmacy Technician

## 2019-09-05 DIAGNOSIS — E1142 Type 2 diabetes mellitus with diabetic polyneuropathy: Secondary | ICD-10-CM

## 2019-09-05 DIAGNOSIS — I2510 Atherosclerotic heart disease of native coronary artery without angina pectoris: Secondary | ICD-10-CM

## 2019-09-05 DIAGNOSIS — I5022 Chronic systolic (congestive) heart failure: Secondary | ICD-10-CM

## 2019-09-05 NOTE — Progress Notes (Signed)
I have reviewed the above note and agree. I was available to the pharmacist for consultation. It would be best to stop the mobic if possible given his current kidney function and CHF history. Please relay this to him.   [name], MD

## 2019-09-05 NOTE — Patient Instructions (Signed)
Visit Information  Goals Addressed              This Visit's Progress     Patient Stated     [name] "I need help w/ my medications" (pt-stated)        CARE PLAN ENTRY (see longitudinal plan of care for additional care plan information)  Current Barriers:   Social, financial, community barriers:  o Working on patient assistance for Kelle Darting, and Entresto  Diabetes: controlled, but not optimally managed; complicated by chronic medical conditions including HFrEF, CAD, HLD,  most recent A1c 6.1%  Most recent eGFR: ~38 mL/min  Current antihyperglycemic regimen: [name] 10 mg, glipizide XL 10 mg daily, [name] 100 mg daily o Metformin recently d/c d/t worsening renal fx and HF   Cardiovascular risk reduction (follows w/ Dr. Fletcher Anon; most recent EF 25-30%; spiro held d/t renal fx; hx MI w/ RCA stenting) o Current hypertensive regimen: furosemide 40 mg BID, carvedilol 6.25 mg BID, Entresto 24/26 mg BID, (farxiga 10 mg daily) o Current hyperlipidemia regimen: atorvastatin 40 mg daily, last LDL very well controlled <55  o Current antiplatelet regimen: clopidogrel 75 mg daily   GERD: pantoprazole 40 mg BID  Supplements: daily MVI, cinnamon  Pain: meloxicam 15 mg daily; prescribed by Dr. Daylene Katayama, podiatry; per Dr. [name], will stop this prescription.   Pharmacist Clinical Goal(s):   Over the next 90 days, patient will work with [name] and primary care provider to address optimized medication management  Interventions:  Contacted patient, discussed risks of meloxicam. He noted he was fine stopping this, as he didn't feel like it was providing much benefit. He requested I call the pharmacy and cancel the script. Called [facility], spoke w/ Maudie Mercury and canceled meloxicam script per Dr. [name].   Patient Self Care Activities:   Patient will check blood glucose BID, document, and provide at future appointments  Patient will take medications as prescribed  Patient  will contact provider with any episodes of hypoglycemia  Patient will report any questions or concerns to provider   Please see past updates related to this goal by clicking on the "Past Updates" button in the selected goal         The patient verbalized understanding of instructions provided today and declined a print copy of patient instruction materials.   Plan:  - Will outreach as previously scheduled  [name], [name], [place], [place] Pharmacist [place] [phone]

## 2019-09-05 NOTE — Chronic Care Management (AMB) (Signed)
Chronic Care Management   Follow Up Note   09/05/2019 Name: Devin Hanna MRN: [id number] DOB: 1950-04-17  Referred by: [name], MD Reason for referral : Chronic Care Management (Medication Management)   Devin Hanna is a 69 y.o. year old male who is a primary care patient of [name], Angela Adam, MD. The CCM team was consulted for assistance with chronic disease management and care coordination needs.    Contacted patient for medication management f/u.   Review of patient status, including review of consultants reports, relevant laboratory and other test results, and collaboration with appropriate care team members and the patient's provider was performed as part of comprehensive patient evaluation and provision of chronic care management services.    SDOH (Social Determinants of Health) assessments performed: Yes See Care Plan activities for detailed interventions related to [facility])     Outpatient Encounter Medications as of 09/05/2019  Medication Sig  . atorvastatin (LIPITOR) 40 MG tablet Take 1 tablet (40 mg total) by mouth daily.  . carvedilol (COREG) 6.25 MG tablet Take 1 tablet (6.25 mg total) by mouth 2 (two) times daily with a meal.  . CINNAMON PO Take 1,000 mg by mouth in the morning and at bedtime.  . clopidogrel (PLAVIX) 75 MG tablet Take 75 mg by mouth daily.  . dapagliflozin propanediol ([name]) 10 MG TABS tablet Take 1 tablet (10 mg total) by mouth daily before breakfast. (Patient not taking: Reported on 09/04/2019)  . furosemide (LASIX) 40 MG tablet Take 1 tablet (40 mg total) by mouth 2 (two) times daily.  Marland Kitchen gabapentin (NEURONTIN) 300 MG capsule TAKE 1 CAPSULE BY MOUTH 3 TIMES DAILY (Patient taking differently: Take 300 mg by mouth 3 (three) times daily. )  . glipiZIDE (GLUCOTROL XL) 10 MG 24 hr tablet Take 10 mg by mouth daily with breakfast.  . Multiple Vitamin (MULTIVITAMIN WITH MINERALS) TABS Take 1 tablet by mouth daily. Multivitamin for Men 15+  .  nitroGLYCERIN (NITROSTAT) 0.4 MG SL tablet Place 1 tablet (0.4 mg total) under the tongue every 5 (five) minutes as needed for chest pain. (Patient not taking: Reported on 08/21/2019)  . pantoprazole (PROTONIX) 40 MG tablet TAKE ONE TABLET TWICE DAILY (Patient taking differently: Take 40 mg by mouth 2 (two) times daily. )  . sacubitril-valsartan (ENTRESTO) 24-26 MG Take 1 tablet by mouth 2 (two) times daily.  . sitaGLIPtin ([name]) 50 MG tablet Take 1 tablet (50 mg total) by mouth daily.  . [DISCONTINUED] meloxicam (MOBIC) 15 MG tablet Take 15 mg by mouth daily.   No facility-administered encounter medications on file as of 09/05/2019.     Objective:   Goals Addressed              This Visit's Progress     Patient Stated   .  [name] "I need help w/ my medications" (pt-stated)        CARE PLAN ENTRY (see longitudinal plan of care for additional care plan information)  Current Barriers:  . Social, financial, community barriers:  o Working on patient assistance for [place], and [place] . Diabetes: controlled, but not optimally managed; complicated by chronic medical conditions including HFrEF, CAD, HLD,  most recent A1c 6.1% . Most recent eGFR: ~38 mL/min . Current antihyperglycemic regimen: [name] 10 mg, glipizide XL 10 mg daily, [name] 100 mg daily o Metformin recently d/c d/t worsening renal fx and HF  . Cardiovascular risk reduction (follows w/ Dr. Fletcher Anon; most recent EF 25-30%; spiro held d/t  renal fx; hx MI w/ RCA stenting) o Current hypertensive regimen: furosemide 40 mg BID, carvedilol 6.25 mg BID, Entresto 24/26 mg BID, (farxiga 10 mg daily) o Current hyperlipidemia regimen: atorvastatin 40 mg daily, last LDL very well controlled <55  o Current antiplatelet regimen: clopidogrel 75 mg daily  . GERD: pantoprazole 40 mg BID . Supplements: daily MVI, cinnamon . Pain: meloxicam 15 mg daily; prescribed by Dr. Daylene Katayama, podiatry; per Dr. [name], will stop this  prescription.   Pharmacist Clinical Goal(s):  Marland Kitchen Over the next 90 days, patient will work with [name] and primary care provider to address optimized medication management  Interventions: . Contacted patient, discussed risks of meloxicam. He noted he was fine stopping this, as he didn't feel like it was providing much benefit. He requested I call the pharmacy and cancel the script. Called [facility], spoke w/ Maudie Mercury and canceled meloxicam script per Dr. [name].   Patient Self Care Activities:  . Patient will check blood glucose BID, document, and provide at future appointments . Patient will take medications as prescribed . Patient will contact provider with any episodes of hypoglycemia . Patient will report any questions or concerns to provider   Please see past updates related to this goal by clicking on the "Past Updates" button in the selected goal          Plan:  - Will outreach as previously scheduled  [name], [name], [place], [place] Pharmacist [place] [phone]

## 2019-09-05 NOTE — Patient Outreach (Signed)
[facility]) Care Management  09/05/2019  [place] [birth year] [id number]                                      Medication Assistance Referral  Referral From: [facility] Embedded RPh Catie T.   Medication/Company: Wilder Glade / [name] Patient application portion:  Mailed Provider application portion:  N/A Embedded RPh to have signed while in clinic to Dr. [name] Provider address/fax verified via: Office website  Medication/Company: [name] / [employer] Patient application portion:  Mailed Provider application portion:  N/A Embedded RPh to have signed while in clinic to Dr. [name] Provider address/fax verified via: Office website  Medication/Company: [name] / [name] Patient application portion:  Mailed Provider application portion:  N/A Embedded RPh to have signed while in clinic to Dr. [name] Provider address/fax verified via: Office website     Follow up:  Will follow up with patient in 5-15 business days to confirm application(s) have been received.  Dreshawn Hendershott P. [name], [place]  [phone]

## 2019-09-07 ENCOUNTER — Other Ambulatory Visit: Payer: Self-pay

## 2019-09-07 ENCOUNTER — Ambulatory Visit: Payer: PPO | Admitting: Family

## 2019-09-07 ENCOUNTER — Telehealth: Payer: Self-pay | Admitting: *Deleted

## 2019-09-07 ENCOUNTER — Encounter: Payer: Self-pay | Admitting: Family

## 2019-09-07 VITALS — BP 120/70 | HR 72 | Ht 70.0 in | Wt 259.1 lb

## 2019-09-07 DIAGNOSIS — I5042 Chronic combined systolic (congestive) and diastolic (congestive) heart failure: Secondary | ICD-10-CM | POA: Diagnosis not present

## 2019-09-07 DIAGNOSIS — Z79899 Other long term (current) drug therapy: Secondary | ICD-10-CM

## 2019-09-07 DIAGNOSIS — N1832 Chronic kidney disease, stage 3b: Secondary | ICD-10-CM | POA: Diagnosis not present

## 2019-09-07 DIAGNOSIS — E785 Hyperlipidemia, unspecified: Secondary | ICD-10-CM

## 2019-09-07 DIAGNOSIS — I25118 Atherosclerotic heart disease of native coronary artery with other forms of angina pectoris: Secondary | ICD-10-CM

## 2019-09-07 NOTE — Progress Notes (Signed)
Office Visit    Patient Name: Devin Hanna Date of Encounter: 09/07/2019  Primary Care Provider:  [name], MD Primary Cardiologist:  [name], MD Electrophysiologist:  None   Chief Complaint    Devin Hanna is a 69 y.o. male with a hx of coronary disease, chronic systolic heart failure, left bundle branch block, peripheral neuropathy, prior GI bleed, GERD, DM2, HTN, HLD presents today for follow up after cardiac catheterization.  Past Medical History    Past Medical History:  Diagnosis Date  . Acute respiratory failure with hypoxia ([place]) 06/2016  . CAD (coronary artery disease)    a. Remote PCI of the RCA;  b. 03/2013 Inf STEMI/PCI: LAD 60-70d, RCA 99p ISR(rota/CBA/DES);  c. 09/2014 MV: inf and infsept infarct, EF 35-40%;  d. 09/2014 Cath: patent RCA stent, stable LAD dzs, EF 40%-->Med rx.  . CHF (congestive heart failure) ([place])   . Diabetes mellitus    sees Dr. Hardin Negus in [place]  . GERD (gastroesophageal reflux disease)   . HFrEF (heart failure with reduced ejection fraction) ([place])   . Hyperlipidemia   . Hypertension   . Ischemic cardiomyopathy    a. 09/2014 EF 40% by V gram;  b. 06/2016 Echo: EF 40, diff HK, Gr1 DD, inf HK, no mural thrombus. Mild MR, mildly dil LA, mildly dil RV.   . LBBB (left bundle branch block)    Old  . Liver mass   . Morbid obesity ([place])   . Peripheral neuropathy   . Upper GI bleed   . Vertigo    Past Surgical History:  Procedure Laterality Date  . CARDIAC CATHETERIZATION N/A 10/03/2014   Procedure: Left Heart Cath and Coronary Angiography;  Surgeon: [name], MD;  Location: [place] CV LAB;  Service: Cardiovascular;  Laterality: N/A;  . CIRCUMCISION N/A 07/22/2015   Procedure: CIRCUMCISION ADULT;  Surgeon: Hollice Espy, MD;  Location: [facility] ORS;  Service: Urology;  Laterality: N/A;  . COLONOSCOPY WITH PROPOFOL N/A 01/12/2019   Procedure: COLONOSCOPY WITH PROPOFOL;  Surgeon: Lucilla Lame, MD;  Location: [facility]  ENDOSCOPY;  Service: Endoscopy;  Laterality: N/A;  . CORONARY STENT PLACEMENT    . ESOPHAGOGASTRODUODENOSCOPY (EGD) WITH PROPOFOL N/A 09/12/2014   Procedure: ESOPHAGOGASTRODUODENOSCOPY (EGD) WITH PROPOFOL;  Surgeon: Ronald Lobo, MD;  Location: [facility] ENDOSCOPY;  Service: Endoscopy;  Laterality: N/A;  . EXTERNAL FIXATION LEG Left 04/03/2012   Procedure: EXTERNAL FIXATION LEG;  Surgeon: Wylene Simmer, MD;  Location: [place];  Service: Orthopedics;  Laterality: Left;  . EXTERNAL FIXATION REMOVAL Left 04/14/2012   Procedure: REMOVAL EXTERNAL FIXATION LEG;  Surgeon: Wylene Simmer, MD;  Location: [place];  Service: Orthopedics;  Laterality: Left;  . IRRIGATION AND DEBRIDEMENT KNEE Left 04/03/2012   Procedure: IRRIGATION AND DEBRIDEMENT KNEE;  Surgeon: Wylene Simmer, MD;  Location: [place];  Service: Orthopedics;  Laterality: Left;  . LEFT HEART CATHETERIZATION WITH CORONARY ANGIOGRAM N/A 04/05/2013   Procedure: LEFT HEART CATHETERIZATION WITH CORONARY ANGIOGRAM;  Surgeon: [name], MD;  Location: [place] CATH LAB;  Service: Cardiovascular;  Laterality: N/A;  . MASS EXCISION N/A 07/22/2015   Procedure: EXCISION PENILE MASS;  Surgeon: Hollice Espy, MD;  Location: [facility] ORS;  Service: Urology;  Laterality: N/A;  . ORIF TIBIA PLATEAU Left 04/14/2012   Procedure: OPEN REDUCTION INTERNAL FIXATION (ORIF) TIBIAL PLATEAU;  Surgeon: Wylene Simmer, MD;  Location: [place];  Service: Orthopedics;  Laterality: Left;  . PERCUTANEOUS CORONARY ROTOBLATOR INTERVENTION (PCI-R) N/A 04/06/2013   Procedure: PERCUTANEOUS CORONARY  ROTOBLATOR INTERVENTION (PCI-R);  Surgeon: [name], MD;  Location: [facility] CATH LAB;  Service: Cardiovascular;  Laterality: N/A;  . RIGHT/LEFT HEART CATH AND CORONARY ANGIOGRAPHY N/A 08/21/2019   Procedure: RIGHT/LEFT HEART CATH AND CORONARY ANGIOGRAPHY;  Surgeon: [name], MD;  Location: [place] CV LAB;  Service: Cardiovascular;  Laterality: N/A;    Allergies  No Known Allergies  History of Present  Illness    Devin Hanna is a 69 y.o. male with a hx of coronary disease, chronic systolic heart failure, left bundle branch block, peripheral neuropathy, prior GI bleed, GERD, DM2, HTN, HLD.  He with last seen for cardiac catheterization.  He has known CAD with RCA stents in the past and prior MI.  He was hospitalized May 2018 for acute on chronic systolic heart failure.  Echo June 2020 with LVEF 35 to 40% which is stable compared to previous.  He was transition from ace inhibitor to [facility] at that time.  He had hypotension on higher dose of Entresto and had to be decreased.    Seen in clinic 06/29/19 reported feeling overall well.  Was noted to have been diagnosed with liver lesion that was too small to biopsy and is being monitored.  He was admitted 07/30/2019-07/12/2019 for shortness of breath.  Due to kidney injury his Entresto and spironolactone were held and his Lasix was increased to 40 mg twice daily.  Underwent echocardiogram 07/31/2019 with LVEF 25-30%, LV mildly dilated, grade 1 diastolic dysfunction, trivial MR. He was recommended for outpatient stress testing as his EF had dropped to rule out ischemia as origin. Seen in clinic 08/08/19 feeling overall well. Lexiscan myoview 08/15/19 showed prior scar with apical defect more severe than previous. He was recommended for cardiac catherization. L/RHC 08/21/19 with patent RCA stent with mild in-stent restenosis. Moderate disease of left system with overall moderate calcifications. No evidence of obstructive disease. RHC with mildly elevated filling pressures, mild pulmonary HTN 31/13 mmhg, high normal cardiac output. Cardiomyopathy was thought to be out of proportion to coronary disease.   Started the [place] yesterday.  His primary care office helping him with affording it as it was $130 co-pay and he is presently in the donut hole.  Reports cardiac catheterization site healing well.  Tells me it bruised for couple of days right above his antecubital  region but this is since resolved.  Reports no chest pain, pressure, tightness.  Reports his dyspnea on exertion is stable at baseline.  Reports enlarged edema, orthopnea, PND.  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  EKGs/Labs/Other Studies Reviewed:   The following studies were reviewed today:  Echo 07/31/19  1. Technically difficult; definity used; severe global reduction in LV  systolic function; grade 1 diastolic dysfunction; mild LVE.   2. Left ventricular ejection fraction, by estimation, is 25 to 30%. The  left ventricle has severely decreased function. The left ventricle  demonstrates global hypokinesis. The left ventricular internal cavity size  was mildly dilated. Left ventricular  diastolic parameters are consistent with Grade I diastolic dysfunction  (impaired relaxation).   3. Right ventricular systolic function is normal. The right ventricular  size is normal.   4. The mitral valve is normal in structure. Trivial mitral valve  regurgitation. No evidence of mitral stenosis.   5. The aortic valve has an indeterminant number of cusps. Aortic valve  regurgitation is not visualized. No aortic stenosis is present.   6.  The inferior vena cava is normal in size with greater than 50%  respiratory variability, suggesting right atrial pressure of 3 mmHg.    EKG:  EKG is ordered today.  The ekg ordered today demonstrates NSR 67 bpmwith LBBB and no acute St/T wave changes.   Recent Labs: 08/08/2019: BNP 198.3 08/15/2019: ALT 32; BUN 22; Creatinine, Ser 1.77; Hemoglobin 12.7; Platelets 161; Potassium 4.5; Sodium 139  Recent Lipid Panel    Component Value Date/Time   CHOL 130 04/03/2019 0837   CHOL 103 06/07/2014 0824   TRIG 199.0 (H) 04/03/2019 0837   HDL 45.90 04/03/2019 0837   HDL 38 (L) 06/07/2014 0824   CHOLHDL 3 04/03/2019 0837   VLDL 39.8 04/03/2019 0837   LDLCALC 44 04/03/2019 0837   LDLCALC 59 03/30/2018  0930    Home Medications   Current Meds  Medication Sig  . atorvastatin (LIPITOR) 40 MG tablet Take 1 tablet (40 mg total) by mouth daily.  . carvedilol (COREG) 3.125 MG tablet Take 3.125 mg by mouth 2 (two) times daily with a meal.  . CINNAMON PO Take 1,000 mg by mouth in the morning and at bedtime.  . clopidogrel (PLAVIX) 75 MG tablet Take 75 mg by mouth daily.  . dapagliflozin propanediol ([name]) 10 MG TABS tablet Take 1 tablet (10 mg total) by mouth daily before breakfast.  . furosemide (LASIX) 40 MG tablet Take 1 tablet (40 mg total) by mouth 2 (two) times daily.  Marland Kitchen gabapentin (NEURONTIN) 300 MG capsule TAKE 1 CAPSULE BY MOUTH 3 TIMES DAILY (Patient taking differently: Take 300 mg by mouth 3 (three) times daily. )  . glipiZIDE (GLUCOTROL XL) 10 MG 24 hr tablet Take 10 mg by mouth daily with breakfast.  . Multiple Vitamin (MULTIVITAMIN WITH MINERALS) TABS Take 1 tablet by mouth daily. Multivitamin for Men 62+  . nitroGLYCERIN (NITROSTAT) 0.4 MG SL tablet Place 1 tablet (0.4 mg total) under the tongue every 5 (five) minutes as needed for chest pain.  . pantoprazole (PROTONIX) 40 MG tablet TAKE ONE TABLET TWICE DAILY (Patient taking differently: Take 40 mg by mouth 2 (two) times daily. )  . sacubitril-valsartan (ENTRESTO) 24-26 MG Take 1 tablet by mouth 2 (two) times daily.  . sitaGLIPtin ([name]) 50 MG tablet Take 1 tablet (50 mg total) by mouth daily.     Review of Systems   Review of Systems  Constitutional: Negative for chills, fever and malaise/fatigue.  Cardiovascular: Positive for dyspnea on exertion. Negative for chest pain, leg swelling, near-syncope, orthopnea, palpitations and syncope.  Respiratory: Negative for cough, shortness of breath and wheezing.   Gastrointestinal: Negative for nausea and vomiting.  Neurological: Negative for dizziness, light-headedness and weakness.   All other systems reviewed and are otherwise negative except as noted above.  Physical Exam     VS:  BP 120/70 (BP Location: Left Arm, Patient Position: Sitting, Cuff Size: Normal)   Pulse 72   Ht 5' 10"  (1.778 m)   Wt (!) 259 lb 1.6 oz (117.5 kg)   SpO2 94%   BMI 37.18 kg/m  , BMI Body mass index is 37.18 kg/m. GEN: Well nourished, overweight,  well developed, in no acute distress. HEENT: normal. Neck: Supple, no JVD, carotid bruits, or masses. Cardiac: RRR, no murmurs, rubs, or gallops. No clubbing, cyanosis, edema.  Radials/DP/PT 2+ and equal bilaterally.  Respiratory: Respirations regular and unlabored, clear to auscultation bilaterally. GI: Soft, nontender, nondistended, BS + x 4. MS: No deformity or atrophy. Skin: Warm and dry,  no rash.  Right brachial cath site without hematoma, no evidence of infection nor ecchymosis. Neuro:  Strength and sensation are intact. Psych: Normal affect.   Assessment & Plan    1. CAD - [facility] 08/2019 with RCA mild instent restenosis and moderate left system disease with no obstructive CAD.  He is on Plavix without aspirin due to prior gastric ulcers.  GDMT includes Plavix, Coreg 3.125 mg twice daily, atorvastatin 40 mg daily, PRN nitroglycerin.  Low-sodium, healthy diet encouraged.  Regular cardiovascular exercise encouraged.  2. Chronic systolic heart failure/NICM -recent admission with LVEF 25 to 30%.  Previous LVEF 1 year ago 35 to 40%. NYHA II.  Recent L/RHC showing he was euvolemic and no ischemic etiology of cardiomyopathy.  GDMT includes Coreg 2.125 mg twice daily, Entresto 24-26 mg twice daily, [name] 10 mg daily, Lasix 40 mg twice daily. He just started [place] yesterday - given samples in clinic today as his PCP office is working with him to make this medication more affordable. Appreciative of their support. In setting of declining renal function ideally will be able to reduce dose of Furosemide (Lasix) in setting of added Entresto/[name]. Low suspicion we will be able to resume Spironolactone due to renal function.   3. CKDIII - 08/15/19  creatinine 1.77, GFR 38. Careful titration of diuretics and antihypertensives. His Metformin and Meloxicam have been discontinued. BMP, BNP today to assess whether need to reduce his Lasix to once daily. Renal duplex ordered  For assessment of worsening renal function to rule out stenosis. Future considerations include referral to nephrology.   4. HTN-BP well controlled.  Continue present antihypertensive regimen.  5. HLD, LDL goal less than 70-03/2019 LDL 44.  Continue present statin.  6. DM2 - Continue to follow with PCP.    Disposition: Follow up in 1 month(s) with Dr. Fletcher Anon or APP  [name], NP 09/07/2019, 9:44 AM

## 2019-09-07 NOTE — Patient Instructions (Addendum)
Medication Instructions:  No medication changes today.   *If you need a refill on your cardiac medications before your next appointment, please call your pharmacy*  Lab Work: Your provider recommends lab work today: BNP, BMP  If you have labs (blood work) drawn today and your tests are completely normal, you will receive your results only by: Marland Kitchen MyChart Message (if you have MyChart) OR . A paper copy in the mail If you have any lab test that is abnormal or we need to change your treatment, we will call you to review the results.   Testing/Procedures: Your physician has requested that you have a renal artery duplex. During this test, an ultrasound is used to evaluate blood flow to the kidneys. Allow one hour for this exam. Do not eat after midnight the day before and avoid carbonated beverages. Take your medications as you usually do.  Follow-Up: At [facility], you and your health needs are our priority.  As part of our continuing mission to provide you with exceptional heart care, we have created designated Provider Care Teams.  These Care Teams include your primary Cardiologist (physician) and Advanced Practice Providers (APPs -  Physician Assistants and Nurse Practitioners) who all work together to provide you with the care you need, when you need it.  We recommend signing up for the patient portal called "MyChart".  Sign up information is provided on this After Visit Summary.  MyChart is used to connect with patients for Virtual Visits (Telemedicine).  Patients are able to view lab/test results, encounter notes, upcoming appointments, etc.  Non-urgent messages can be sent to your provider as well.   To learn more about what you can do with MyChart, go to [email or web address].    Your next appointment:   1 month(s) or after Renal artery duplex.   The format for your next appointment:   In Person  Provider:   You may see [name], MD or one of the following Advanced  Practice Providers on your designated Care Team:    Murray Hodgkins, NP  Christell Faith, PA-C  Marrianne Mood, PA-C  Laurann Montana, NP  Other Instructions   Recommend weighing daily and keeping a log. If you gain 3 pounds overnight or 5 pounds in one week, please call our office.    Continue low salt diet.    Continue to drink less than 2 liters of fluid per day.

## 2019-09-07 NOTE — Telephone Encounter (Signed)
Patient was here in clinic this morning.   Samples for [name] samples were to be given to patient prior to leaving and were not by mistake. Called patient to let him know we have the samples. He is agreeable to stop by the office at his convenience to pick them up. Placed at front desk sample area.

## 2019-09-08 ENCOUNTER — Telehealth: Payer: Self-pay

## 2019-09-08 LAB — BASIC METABOLIC PANEL
BUN/Creatinine Ratio: 14 (ref 10–24)
BUN: 25 mg/dL (ref 8–27)
CO2: 22 mmol/L (ref 20–29)
Calcium: 9.4 mg/dL (ref 8.6–10.2)
Chloride: 101 mmol/L (ref 96–106)
Creatinine, Ser: 1.74 mg/dL — ABNORMAL HIGH (ref 0.76–1.27)
GFR calc Af Amer: 45 mL/min/{1.73_m2} — ABNORMAL LOW (ref >59)
GFR calc non Af Amer: 39 mL/min/{1.73_m2} — ABNORMAL LOW (ref >59)
Glucose: 152 mg/dL — ABNORMAL HIGH (ref 65–99)
Potassium: 4.7 mmol/L (ref 3.5–5.2)
Sodium: 140 mmol/L (ref 134–144)

## 2019-09-08 LAB — BRAIN NATRIURETIC PEPTIDE: BNP: 144.7 pg/mL — ABNORMAL HIGH (ref 0.0–100.0)

## 2019-09-08 NOTE — Telephone Encounter (Signed)
Patient made aware of lab results and Caitlin walker, NP recommendation. Patient verbalized understanding and has no questions or concerns at this time.

## 2019-09-08 NOTE — Telephone Encounter (Signed)
-----   Message from [name], NP sent at 09/08/2019  4:36 PM EDT ----- Renal function overall stable. Normal electrolytes. BNP improved compared to previous. Starting Monday, recommend he reduce his Furosemide to 40m ONCE daily.  The FIranhe is taking will help with diuretic effect and allows uKoreato use less Lasix and protect his kidney function. He has repeat labs set up with primary care 09/22/19.

## 2019-09-14 ENCOUNTER — Other Ambulatory Visit: Payer: Self-pay | Admitting: Pharmacy Technician

## 2019-09-14 NOTE — Patient Outreach (Signed)
[place] [name]) Care Management  09/14/2019  [place] [birth year] [id number]   Successful call placed to patient's daughter [name] regarding patient assistance application(s) for [place] with [name], [place] with [name] and [employer] with [name] , HIPAA identifiers verified.   [name] informed they had received the applications and she mailed them back this week.   Follow up:  Will route note to embedded [facility] RPh [name] for case closure if document(s) have not been received in the next 15 business days.  Hughey Rittenberry P. Lashonne Shull, [place]  [phone]

## 2019-09-18 ENCOUNTER — Other Ambulatory Visit: Payer: Self-pay | Admitting: Pharmacy Technician

## 2019-09-18 NOTE — Patient Outreach (Signed)
[place] [facility]) Care Management  09/18/2019  [place] [birth year] [id number]  Received both patient and provider portion(s) of patient assistance application(s) for [name] and [name]. Faxed completed application and required documents into [name] and [name] and mailed [name].  Will follow up with company(ies) in 10-14 business days to check status of application(s).  Quynh Basso P. Anselmo Reihl, [place]  [phone]

## 2019-09-19 ENCOUNTER — Telehealth: Payer: PPO

## 2019-09-20 ENCOUNTER — Other Ambulatory Visit: Payer: Self-pay | Admitting: Cardiovascular Disease

## 2019-09-21 ENCOUNTER — Other Ambulatory Visit: Payer: Self-pay

## 2019-09-21 MED ORDER — ATORVASTATIN CALCIUM 40 MG PO TABS: 40.0000 mg | ORAL_TABLET | Freq: Every day | ORAL | 0 refills | Status: DC

## 2019-09-22 ENCOUNTER — Other Ambulatory Visit: Payer: Self-pay

## 2019-09-22 ENCOUNTER — Other Ambulatory Visit (INDEPENDENT_AMBULATORY_CARE_PROVIDER_SITE_OTHER): Payer: PPO

## 2019-09-22 DIAGNOSIS — N1832 Chronic kidney disease, stage 3b: Secondary | ICD-10-CM | POA: Diagnosis not present

## 2019-09-22 LAB — BASIC METABOLIC PANEL
BUN: 29 mg/dL — ABNORMAL HIGH (ref 6–23)
CO2: 25 mEq/L (ref 19–32)
Calcium: 9 mg/dL (ref 8.4–10.5)
Chloride: 103 mEq/L (ref 96–112)
Creatinine, Ser: 1.95 mg/dL — ABNORMAL HIGH (ref 0.40–1.50)
GFR: 34.04 mL/min — ABNORMAL LOW (ref >60.00)
Glucose, Bld: 131 mg/dL — ABNORMAL HIGH (ref 70–99)
Potassium: 4.3 mEq/L (ref 3.5–5.1)
Sodium: 138 mEq/L (ref 135–145)

## 2019-09-25 ENCOUNTER — Other Ambulatory Visit: Payer: Self-pay | Admitting: Family Medicine

## 2019-09-25 ENCOUNTER — Other Ambulatory Visit: Payer: Self-pay | Admitting: Pharmacy Technician

## 2019-09-25 DIAGNOSIS — N179 Acute kidney failure, unspecified: Secondary | ICD-10-CM

## 2019-09-25 NOTE — Patient Outreach (Signed)
[facility] [facility]) Care Management  09/25/2019  [place] Nov 15, 1950 [id number]  Care coordination call placed to [employer] in regards to [facility] application.  Spoke to [place] who informed patient was denied on 09/20/2019 due to having insurance. Inquired if Medicare D patients were no longer eligible to apply. She informed that they were able to apply. Inquired if she could explain his denial further as the fax said it was because he had insurance.She informed there is a note about him possibly  being over income and not meeting the 9%. Inquired if she could elaborate and she informed that was the only information she had. Inquired if I could speak to another representative who could explain the determination better and she informed this was all that any of the reps could see and would not transfer me. She informed she would fax over to the provider's office an appeals form that could be filled and faxed back to have the case re evaluated.  In basket message sent to embedded [facility] RPh [name] as Juluis Rainier that a fax would be coming.  Devin Hanna, [place]  [phone]

## 2019-09-27 ENCOUNTER — Telehealth: Payer: Self-pay

## 2019-09-27 ENCOUNTER — Other Ambulatory Visit: Payer: Self-pay | Admitting: Pharmacy Technician

## 2019-09-27 NOTE — Patient Outreach (Addendum)
[facility]) Care Management  09/27/2019  [place] 11-25-1950 [id number]   Care coordination call placed to [employer] in regards to patient's [facility] application.  Spoke to Devin Hanna who informed that patient's yearly OOP did not meet program guidelines (9%) based on income submitted and therefore he was denied. She informed an appeals form was faxed to the provider's office on 09/26/2019. Informed Devin Hanna we received the cover page but not the actual appeals document. Devin Hanna informed she re faxed the documents and confirmed the appeals form was attached.  Will outreach embedded [place] with this information.  Devin Hanna, [place]  [phone]

## 2019-09-27 NOTE — Telephone Encounter (Signed)
A fax response for new patient assistance was filled out and faxed to pharmacy services.  Fax confirmation received.  Loveda Colaizzi,cma

## 2019-09-27 NOTE — Patient Outreach (Signed)
[facility]) Care Management  09/27/2019  [place] [birth year] [id number]   ADDEDNUM  Care coordination call placed to [name] in regards to [facility] application.  Spoke to [place] who informed patient was APPROVED 09/20/2019-02/09/2020. She informed patient should received the medication on Friday by end of day.  Will follow up with patient in 5-10 business days to confirm medication was received.  Tasman Zapata P. Wynne Jury, [place]  [phone]

## 2019-09-29 ENCOUNTER — Other Ambulatory Visit (INDEPENDENT_AMBULATORY_CARE_PROVIDER_SITE_OTHER): Payer: PPO

## 2019-09-29 ENCOUNTER — Other Ambulatory Visit: Payer: Self-pay

## 2019-09-29 DIAGNOSIS — N179 Acute kidney failure, unspecified: Secondary | ICD-10-CM

## 2019-09-29 LAB — BASIC METABOLIC PANEL
BUN: 21 mg/dL (ref 6–23)
CO2: 30 mEq/L (ref 19–32)
Calcium: 8.9 mg/dL (ref 8.4–10.5)
Chloride: 103 mEq/L (ref 96–112)
Creatinine, Ser: 1.83 mg/dL — ABNORMAL HIGH (ref 0.40–1.50)
GFR: 36.62 mL/min — ABNORMAL LOW (ref >60.00)
Glucose, Bld: 117 mg/dL — ABNORMAL HIGH (ref 70–99)
Potassium: 4.6 mEq/L (ref 3.5–5.1)
Sodium: 141 mEq/L (ref 135–145)

## 2019-10-02 ENCOUNTER — Telehealth: Payer: Self-pay | Admitting: Family Medicine

## 2019-10-02 NOTE — Telephone Encounter (Signed)
Daughter was returning call about lab results.

## 2019-10-03 ENCOUNTER — Other Ambulatory Visit: Payer: Self-pay

## 2019-10-03 ENCOUNTER — Ambulatory Visit (INDEPENDENT_AMBULATORY_CARE_PROVIDER_SITE_OTHER): Payer: PPO

## 2019-10-03 ENCOUNTER — Telehealth: Payer: Self-pay | Admitting: Pharmacist

## 2019-10-03 ENCOUNTER — Telehealth: Payer: PPO

## 2019-10-03 DIAGNOSIS — N1832 Chronic kidney disease, stage 3b: Secondary | ICD-10-CM

## 2019-10-03 NOTE — Telephone Encounter (Signed)
  Chronic Care Management   Note  10/03/2019 Name: Devin Hanna MRN: [id number] DOB: May 19, 1950   Attempted to contact patient for scheduled appointment for medication management support. Left HIPAA compliant message for patient to return my call at their convenience.    Plan: - If I do not hear back from the patient by end of business today, will collaborate with Care Guide to outreach to schedule follow up with me  [name], [name], [place], [place] Pharmacist [place] [phone]

## 2019-10-04 NOTE — Telephone Encounter (Signed)
Daughter was returning call for results

## 2019-10-05 ENCOUNTER — Other Ambulatory Visit: Payer: Self-pay | Admitting: Pharmacy Technician

## 2019-10-05 ENCOUNTER — Telehealth: Payer: Self-pay

## 2019-10-05 NOTE — Telephone Encounter (Signed)
Daughter called back again asking for result for her father labs work

## 2019-10-05 NOTE — Telephone Encounter (Signed)
Call to patient to review kidney u/s results.    Spoke to daughter who verbalized understanding and has no further questions at this time.    Advised pt to call for any further questions or concerns.  No further orders.   Daughter accepted mychart invite and will forward nephrologist information to [place] to share ultrasound results with them.

## 2019-10-05 NOTE — Telephone Encounter (Signed)
Attempted to call patient. LMTCB 10/05/2019

## 2019-10-05 NOTE — Patient Outreach (Signed)
[place] [facility]) Care Management  10/05/2019  Devin Hanna 08-27-1950 [id number]  ADDENDUM   Unsuccessful call placed to patient regarding patient assistance medication delivery of [name] with [name], receipt of attestation form from [name] for [name] and to update patient on Entresto applicaiton with [name], HIPAA compliant voicemail left.   Was calling to inquire if patient had received [name] from the [place] patient assistance company and if had received the attestation form from [name] patient assistance company. Was also going to update him on the [name] application.  Follow up:  Will follow up with patient in 5-7 business days if call is not returned.  Zephaniah Lubrano P. Gwendolyn Mclees, [place]  [phone]

## 2019-10-05 NOTE — Patient Outreach (Signed)
[facility]) Care Management  10/05/2019  [place] May 28, 1950 [id number]  Care coordination call placed to [employer] patient assistance in regards to [facility] application and [name] patient assistance in regards to [place] application.  Spoke to Hester at [name]. Informed Lattie Haw that previous representatives at [name] had attempted to fax over to provider's office an appeals form. Informed her that we had received the cover page on 2 separate occasions but not the actual appeals form that we were required to complete. After reviewing the patient's case, Lattie Haw informed she was going to send the task back over to the processing team as patient had included a hardship etter and indicated that his income had changed and was not accurately reflected on the 1040. In addition, he submitted proof of what his current income is. Lattie Haw informed they should be looking at the task by tomorrow. If patient is still over income based on the newest information, then she will see about having the appeals form sent back over.  Will follow up with [employer] in 3-5 business days.  Second care coordination call placed to [name]. Spoke to Angelica. Angelica informed they received the patient's [name] application on 1/85/63. She informed [name] mailed back to patient on 09/26/19, the attestation form as well as the completed application. She informed patient would need to complete the attestation form and mail that form and the original completed application back to [name] with the envelope that [name] enclosed. She informed once that information is received then the determination process would continue.  Will outreach patient with this information.  Chloey Ricard P. Jami Bogdanski, [place]  [phone]

## 2019-10-05 NOTE — Telephone Encounter (Signed)
-----   Message from [name], NP sent at 10/04/2019  4:46 PM EDT ----- No evidence of renal artery stenosis. 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 Due to his persistent abnormal kidney blood work and abnormal resistive index, recommend he establish with nephrology.

## 2019-10-05 NOTE — Telephone Encounter (Signed)
Pt has been r/s for 09/272021

## 2019-10-06 ENCOUNTER — Other Ambulatory Visit: Payer: Self-pay | Admitting: Family Medicine

## 2019-10-06 DIAGNOSIS — N1832 Chronic kidney disease, stage 3b: Secondary | ICD-10-CM

## 2019-10-06 NOTE — Telephone Encounter (Signed)
Devin Hanna, [place]  10/05/2019  1:53 PM EDT Back to Top    Called and spoke with the patient's daughter and informed her of the patient's lab results and she understood. She would like a referral to see a kidney specialist.  Nina,cma    [name] [name], [place]  10/02/2019  3:00 PM EDT     Left message to call back.   [name], MD  10/01/2019  6:30 PM EDT     Please let the patient know his kidney function remains elevated though did improve slightly. If he is not seeing a kidney specialist I would like to refer him to evaluate further.

## 2019-10-09 ENCOUNTER — Other Ambulatory Visit: Payer: Self-pay | Admitting: Pharmacy Technician

## 2019-10-09 NOTE — Patient Outreach (Signed)
[place] [facility]) Care Management  10/09/2019  [place] 11/27/1950 [id number]  Unsuccessful outreach call placed to patient in regards to [name] application for [place], [name] application for [employer] and Scientist, clinical (histocompatibility and immunogenetics) for [employer].  Unfortunately patient did not answer the phone, HIPAA compliant voicemail left.  Was calling to inquire if patient had received [name] from [name] and to discuss refill procedure, provide patient phone number to [employer] to set up his [place] shipment and to inquire if he received the attestation letter that [name] mailed to him on 09/26/2019.  Will attempt another outreach in 5-7 business days if call is not returned. Will also route note to embedded [facility] RPh Catie [place] as FYI.  Keyairra Kolinski P. Londyn Wotton, [place]  [phone]

## 2019-10-09 NOTE — Patient Outreach (Signed)
[facility] [facility]) Care Management  10/09/2019  [place] [birth year] [id number]  Care coordination call placed to [employer] in regards to patient's application for Entresto.  Spoke to [employer] who informed patient was APPROVED 10/06/2019-02/09/2020. She informed patient would need to call into [employer] to set up shipment.  Will outreach patient with this information.  Devin Hanna P. Janautica Netzley, [place]  [phone]

## 2019-10-10 ENCOUNTER — Other Ambulatory Visit: Payer: Self-pay

## 2019-10-10 ENCOUNTER — Ambulatory Visit (INDEPENDENT_AMBULATORY_CARE_PROVIDER_SITE_OTHER): Payer: PPO | Admitting: Pharmacist

## 2019-10-10 ENCOUNTER — Telehealth: Payer: Self-pay | Admitting: Family Medicine

## 2019-10-10 ENCOUNTER — Ambulatory Visit (INDEPENDENT_AMBULATORY_CARE_PROVIDER_SITE_OTHER): Payer: PPO | Admitting: Family Medicine

## 2019-10-10 ENCOUNTER — Encounter: Payer: Self-pay | Admitting: Family Medicine

## 2019-10-10 VITALS — BP 120/70 | HR 66 | Temp 98.0°F | Ht 70.0 in | Wt 260.6 lb

## 2019-10-10 DIAGNOSIS — I5022 Chronic systolic (congestive) heart failure: Secondary | ICD-10-CM

## 2019-10-10 DIAGNOSIS — I10 Essential (primary) hypertension: Secondary | ICD-10-CM | POA: Diagnosis not present

## 2019-10-10 DIAGNOSIS — I25118 Atherosclerotic heart disease of native coronary artery with other forms of angina pectoris: Secondary | ICD-10-CM | POA: Diagnosis not present

## 2019-10-10 DIAGNOSIS — E1142 Type 2 diabetes mellitus with diabetic polyneuropathy: Secondary | ICD-10-CM

## 2019-10-10 LAB — POCT GLYCOSYLATED HEMOGLOBIN (HGB A1C): Hemoglobin A1C: 5.9 % — AB (ref 4.0–5.6)

## 2019-10-10 NOTE — Assessment & Plan Note (Signed)
Fasting sugars have improved since going on farxiga.  He will continue his current regimen as his A1c is very well controlled.

## 2019-10-10 NOTE — Patient Instructions (Addendum)
Devin Hanna,   It was great to meet you today!  To receive the Entresto from the drug company, you need to call [employer] at [phone] to schedule your delivery.   To refill Devin Hanna, call [place] at [phone] when you start your last bottle.   My pharmacy technician, Devin Hanna, will be calling you in the future to follow up on making sure you receive the Entresto.   Devin Hanna, [name] [phone] Visit Information  Goals Addressed              This Visit's Progress     Patient Stated   .  [name] "I need help w/ my medications" (pt-stated)        CARE PLAN ENTRY (see longitudinal plan of care for additional care plan information)  Current Barriers:  . Social, financial, community barriers:  o Working on patient assistance for Devin Hanna . Diabetes: controlled, but not optimally managed; complicated by chronic medical conditions including HFrEF, CAD, HLD,  most recent A1c 5.8% . Most recent eGFR: ~38 mL/min . Current antihyperglycemic regimen: [name] 10 mg, glipizide XL 10 mg daily, [name] 50 mg daily o Metformin recently d/c d/t worsening renal fx and HF  o APPROVED for [name] assistance through [facility] . No concerns w/ hypoglyceima . Cardiovascular risk reduction (follows w/ Dr. Fletcher Hanna; most recent EF 25-30%; spiro held d/t renal fx; hx MI w/ RCA stenting) o APPROVED for Entresto assistance through [name]. Patient notes that he called and set up shipping, and should arrive in ~3-5 days o Current hypertensive regimen: furosemide 40 mg BID, carvedilol 6.25 mg BID, Entresto 24/26 mg BID; home BP readings slightly elevated  o Current hyperlipidemia regimen: atorvastatin 40 mg daily, last LDL very well controlled <55  o Current antiplatelet regimen: clopidogrel 75 mg daily  . GERD: pantoprazole 40 mg BID . Supplements: daily MVI, cinnamon  Pharmacist Clinical Goal(s):  Marland Kitchen Over the next 90 days, patient will work with [name] and primary care provider to address  optimized medication management  Interventions: . Comprehensive medication review performed, medication list updated in electronic medical record . Inter-disciplinary care team collaboration (see longitudinal plan of care) . Reviewed A1c today, discussed w/ PCP. Stopping glipizide and [name]. Patient is to continue to monitor BG BID at home. Discussed use of GLP1 if sugars require it moving forward, counseled on [name] today.  . Will communicate w/ CPhT to not move forward w/ [name] acquisition from [name].   Patient Self Care Activities:  . Patient will check blood glucose BID, document, and provide at future appointments . Patient will take medications as prescribed . Patient will contact provider with any episodes of hypoglycemia . Patient will report any questions or concerns to provider   Please see past updates related to this goal by clicking on the "Past Updates" button in the selected goal         Print copy of patient instructions provided.   Plan:  - Will f/u in ~ 4 weeks as previously scheduled  Devin Hanna, [name], [place], [place] Pharmacist [place] [phone]

## 2019-10-10 NOTE — Telephone Encounter (Signed)
Please let the patient know that I heard back from Cardiology. She noted that we could add on hydralazine to help with his blood pressure. I can send this in for him once you speak with him. Thanks.

## 2019-10-10 NOTE — Telephone Encounter (Signed)
-----   Message from [name], NP sent at 10/10/2019 12:16 PM EDT ----- [place],  Given his renal function I don't think we can push his Entresto dose up, though it would help with BP. Nor increase Coreg due to HR. I agree with BP goal of <130/80.  My next step would either be a low dose Hydralazine or Imdur. Both will have HF as well as BP benefit and protect his renal function. I find the TID dosing of Hydralazine is often difficult for patients can lead to some dose overlapping and subsequent hypotension. I think a 10 or 108m BID dose would be reasonable starting point. If you opt for Imdur, a 145mor 3033maily to start. I often start folks low and then gradually increase.  He doesn't have follow up with us [place] 11/09/19. We are happy to see him sooner if you feel he needs it! I'm not sure when his appt with nephrology is.  BesSunnie Nielsen--- Message ----- From: SonLeone HavenD Sent: 10/10/2019   9:03 AM EDT To: CaiLoel DubonnetP  Hi Vito Berger I saw Mr Mehlberg today for follow-up. The majority of his recent BPs have been in the 130s/60s-70s. Pulse has been around 60. Given his DM my goal would be for his BP to be less than 130/80. I know his BP management is a [place] complicated with his kidney function and his history of low BPs. I wanted to get your input on what the next step might be for his BP regimen if we needed to add something. Thanks for your help.   EriRandall Hiss

## 2019-10-10 NOTE — Progress Notes (Signed)
Devin Rumps, MD Phone: [phone]  Devin Hanna is a 69 y.o. male who presents today for f/u.  HYPERTENSION/CHF Disease Monitoring  Home BP Monitoring 121-166/55-71 Chest pain- no    Dyspnea- no Medications  Compliance-  Taking coreg, lasix 20 mg daily, entresto, farxiga.  Edema- much improved  DIABETES Disease Monitoring: Blood Sugar ranges-mostly less than 140 fasting, <200 2 hours after dinner Polyuria/phagia/dipsia- some polyuria      Optho- due Medications: Compliance- taking glipizide, farxiga, januvia Hypoglycemic symptoms- no   Bilateral knee pain: Patient notes this has been an ongoing issue.  He feels that he has arthritis.  Occasionally feels like his kneecaps rub.  Hurts him when he walks.  He uses a muscle rub on it and takes Tylenol as needed for this.  Social History   Tobacco Use  Smoking Status Former Smoker  . Packs/day: 3.00  . Years: 40.00  . Pack years: 120.00  . Types: Cigarettes  . Quit date: 04/05/1995  . Years since quitting: 24.5  Smokeless Tobacco Former [name]   started smoking at age 50     ROS see history of present illness  Objective  Physical Exam Vitals:   10/10/19 0824  BP: 120/70  Pulse: 66  Temp: 98 F (36.7 C)  SpO2: 97%    BP Readings from Last 3 Encounters:  10/10/19 120/70  09/07/19 120/70  08/21/19 (!) 107/48   Wt Readings from Last 3 Encounters:  10/10/19 260 lb 9.6 oz (118.2 kg)  09/07/19 (!) 259 lb 1.6 oz (117.5 kg)  08/21/19 250 lb (113.4 kg)    Physical Exam Constitutional:      General: He is not in acute distress.    Appearance: He is not diaphoretic.  Cardiovascular:     Rate and Rhythm: Normal rate and regular rhythm.     Heart sounds: Normal heart sounds.  Pulmonary:     Effort: Pulmonary effort is normal.     Breath sounds: Normal breath sounds.  Musculoskeletal:     Comments: No apparent swelling of either knee, no tenderness to palpation of either knee, trace pitting  edema bilateral lower extremities at the level of his ankles  Skin:    General: Skin is warm and dry.  Neurological:     Mental Status: He is alert.      Assessment/Plan: Please see individual problem list.  HTN (hypertension) Slightly above goal given his history of diabetes.  His medications have been reduced in the past related to lightheadedness and his renal function.  I will send a message to his cardiology team to get their input on what his goal blood pressure would be for them and what the next step might be for blood pressure management.  He will continue his current regimen.  Chronic systolic heart failure (HCC) Overall doing well.  Asymptomatic.  Weight relatively stable over the last month.  He will continue his current regimen.  DM type 2 (diabetes mellitus, type 2) (HCC) Fasting sugars have improved since going on farxiga.  He will continue his current regimen as his A1c is very well controlled.   Health Maintenance: patient defers flu vaccine to when he can get the high dose version. Declined covid vaccine. I discussed the benefits of getting the Covid vaccine.  Discussed the risk of getting Covid.  Encouraged him to get the Covid vaccine.  Orders Placed This Encounter  Procedures  . POCT HgB A1C    No orders of the defined  types were placed in this encounter.   This visit occurred during the SARS-CoV-2 public health emergency.  Safety protocols were in place, including screening questions prior to the visit, additional usage of staff PPE, and extensive cleaning of exam room while observing appropriate contact time as indicated for disinfecting solutions.    Devin Rumps, MD [place]

## 2019-10-10 NOTE — Patient Instructions (Addendum)
Nice to see you. Please stop the glipizide and januvia. Your A1c was 5.9!!! We will contact you when I hear back from your cardiology team.

## 2019-10-10 NOTE — Assessment & Plan Note (Addendum)
Slightly above goal given his history of diabetes.  His medications have been reduced in the past related to lightheadedness and his renal function.  I will send a message to his cardiology team to get their input on what his goal blood pressure would be for them and what the next step might be for blood pressure management.  He will continue his current regimen.

## 2019-10-10 NOTE — Assessment & Plan Note (Signed)
Overall doing well.  Asymptomatic.  Weight relatively stable over the last month.  He will continue his current regimen.

## 2019-10-10 NOTE — Chronic Care Management (AMB) (Signed)
Chronic Care Management   Follow Up Note   10/10/2019 Name: Devin Hanna MRN: [id number] DOB: 1951-01-13  Referred by: [name], MD Reason for referral : Chronic Care Management (Medication Management)   Devin Hanna is a 69 y.o. year old male who is a primary care patient of [name], Angela Adam, MD. The CCM team was consulted for assistance with chronic disease management and care coordination needs.    Met with patient face to face during PCP appt.  Review of patient status, including review of consultants reports, relevant laboratory and other test results, and collaboration with appropriate care team members and the patient's provider was performed as part of comprehensive patient evaluation and provision of chronic care management services.    SDOH (Social Determinants of Health) assessments performed: Yes See Care Plan activities for detailed interventions related to SDOH)  SDOH Interventions     Most Recent Value  SDOH Interventions  Financial Strain Interventions Other (Comment)  [manufacturer assistance]       Outpatient Encounter Medications as of 10/10/2019  Medication Sig  . atorvastatin (LIPITOR) 40 MG tablet Take 1 tablet (40 mg total) by mouth daily.  . carvedilol (COREG) 3.125 MG tablet Take 3.125 mg by mouth 2 (two) times daily with a meal.  . CINNAMON PO Take 1,000 mg by mouth in the morning and at bedtime.  . clopidogrel (PLAVIX) 75 MG tablet TAKE ONE TABLET EVERY MORNING WITH BREAKFAST  . dapagliflozin propanediol ([name]) 10 MG TABS tablet Take 1 tablet (10 mg total) by mouth daily before breakfast.  . furosemide (LASIX) 40 MG tablet Take 1 tablet (40 mg total) by mouth 2 (two) times daily. (Patient taking differently: Take 40 mg by mouth daily. )  . gabapentin (NEURONTIN) 300 MG capsule TAKE 1 CAPSULE BY MOUTH 3 TIMES DAILY (Patient taking differently: Take 300 mg by mouth 3 (three) times daily. )  . Multiple Vitamin (MULTIVITAMIN WITH MINERALS)  TABS Take 1 tablet by mouth daily. Multivitamin for Men 4+  . nitroGLYCERIN (NITROSTAT) 0.4 MG SL tablet Place 1 tablet (0.4 mg total) under the tongue every 5 (five) minutes as needed for chest pain.  . pantoprazole (PROTONIX) 40 MG tablet TAKE ONE TABLET TWICE DAILY  . sacubitril-valsartan (ENTRESTO) 24-26 MG Take 1 tablet by mouth 2 (two) times daily.  . [DISCONTINUED] glipiZIDE (GLUCOTROL XL) 10 MG 24 hr tablet Take 10 mg by mouth daily with breakfast.  . [DISCONTINUED] sitaGLIPtin ([name]) 50 MG tablet Take 1 tablet (50 mg total) by mouth daily.   No facility-administered encounter medications on file as of 10/10/2019.     Objective:   Goals Addressed              This Visit's Progress     Patient Stated   .  [name] "I need help w/ my medications" (pt-stated)        CARE PLAN ENTRY (see longitudinal plan of care for additional care plan information)  Current Barriers:  . Social, financial, community barriers:  o Working on patient assistance for [name] . Diabetes: controlled, but not optimally managed; complicated by chronic medical conditions including HFrEF, CAD, HLD,  most recent A1c 5.8% . Most recent eGFR: ~38 mL/min . Current antihyperglycemic regimen: [name] 10 mg, glipizide XL 10 mg daily, [name] 50 mg daily o Metformin recently d/c d/t worsening renal fx and HF  o APPROVED for [name] assistance through [facility] Me . No concerns w/ hypoglyceima . Cardiovascular risk reduction (follows w/  Dr. Fletcher Anon; most recent EF 25-30%; spiro held d/t renal fx; hx MI w/ RCA stenting) o APPROVED for Entresto assistance through [name]. Patient notes that he called and set up shipping, and should arrive in ~3-5 days o Current hypertensive regimen: furosemide 40 mg BID, carvedilol 6.25 mg BID, Entresto 24/26 mg BID; home BP readings slightly elevated  o Current hyperlipidemia regimen: atorvastatin 40 mg daily, last LDL very well controlled <55  o Current antiplatelet  regimen: clopidogrel 75 mg daily  . GERD: pantoprazole 40 mg BID . Supplements: daily MVI, cinnamon  Pharmacist Clinical Goal(s):  Marland Kitchen Over the next 90 days, patient will work with [name] and primary care provider to address optimized medication management  Interventions: . Comprehensive medication review performed, medication list updated in electronic medical record . Inter-disciplinary care team collaboration (see longitudinal plan of care) . Reviewed A1c today, discussed w/ PCP. Stopping glipizide and [name]. Patient is to continue to monitor BG BID at home. Discussed use of GLP1 if sugars require it moving forward, counseled on [name] today.  . Will communicate w/ CPhT to not move forward w/ [name] acquisition from [name].   Patient Self Care Activities:  . Patient will check blood glucose BID, document, and provide at future appointments . Patient will take medications as prescribed . Patient will contact provider with any episodes of hypoglycemia . Patient will report any questions or concerns to provider   Please see past updates related to this goal by clicking on the "Past Updates" button in the selected goal          Plan:  - Will f/u in ~ 4 weeks as previously scheduled  [name], [name], [place], [place] Pharmacist [place] [phone]

## 2019-10-12 MED ORDER — HYDRALAZINE HCL 10 MG PO TABS: 10.0000 mg | ORAL_TABLET | Freq: Two times a day (BID) | ORAL | 3 refills | Status: DC

## 2019-10-12 NOTE — Telephone Encounter (Signed)
Sent to pharmacy. He needs a BP check in 1-2 weeks with nursing. Thanks.

## 2019-10-12 NOTE — Addendum Note (Signed)
Addended by: [name], Geoge Lawrance G on: 10/12/2019 12:59 PM   Modules accepted: Orders

## 2019-10-12 NOTE — Telephone Encounter (Signed)
Patient's daughter called back and I informed her that the provider wanted to start the patient on Hydalazine for his BP and she stated that was okay to send to [facility].  Dequita Schleicher,cma

## 2019-10-12 NOTE — Telephone Encounter (Signed)
I called and LVM for the patient to call back fro a message from the provider.  Lakely Elmendorf,cma

## 2019-10-12 NOTE — Telephone Encounter (Signed)
I Called and scheduled a nurse visit with the patient's daughter to check his BP.  Bronislaus Verdell,cma

## 2019-10-13 ENCOUNTER — Other Ambulatory Visit: Payer: Self-pay | Admitting: Pharmacy Technician

## 2019-10-13 ENCOUNTER — Ambulatory Visit: Payer: PPO | Admitting: Pharmacist

## 2019-10-13 DIAGNOSIS — I5022 Chronic systolic (congestive) heart failure: Secondary | ICD-10-CM

## 2019-10-13 DIAGNOSIS — E1142 Type 2 diabetes mellitus with diabetic polyneuropathy: Secondary | ICD-10-CM

## 2019-10-13 DIAGNOSIS — I25118 Atherosclerotic heart disease of native coronary artery with other forms of angina pectoris: Secondary | ICD-10-CM

## 2019-10-13 NOTE — Chronic Care Management (AMB) (Signed)
Chronic Care Management   Follow Up Note   10/13/2019 Name: Devin Hanna MRN: [id number] DOB: [birth year]  Referred by: [name], MD Reason for referral : Chronic Care Management (Medication Management)   Devin Hanna is a 69 y.o. year old male who is a primary care patient of [name], Angela Adam, MD. The CCM team was consulted for assistance with chronic disease management and care coordination needs.    Review of patient status, including review of consultants reports, relevant laboratory and other test results, and collaboration with appropriate care team members and the patient's provider was performed as part of comprehensive patient evaluation and provision of chronic care management services.    SDOH (Social Determinants of Health) assessments performed: Yes See Care Plan activities for detailed interventions related to SDOH)  SDOH Interventions     Most Recent Value  SDOH Interventions  Financial Strain Interventions Other (Comment)  [manufacturer assistance]       Outpatient Encounter Medications as of 10/13/2019  Medication Sig   atorvastatin (LIPITOR) 40 MG tablet Take 1 tablet (40 mg total) by mouth daily.   carvedilol (COREG) 3.125 MG tablet Take 3.125 mg by mouth 2 (two) times daily with a meal.   CINNAMON PO Take 1,000 mg by mouth in the morning and at bedtime.   clopidogrel (PLAVIX) 75 MG tablet TAKE ONE TABLET EVERY MORNING WITH BREAKFAST   dapagliflozin propanediol ([name]) 10 MG TABS tablet Take 1 tablet (10 mg total) by mouth daily before breakfast.   furosemide (LASIX) 40 MG tablet Take 1 tablet (40 mg total) by mouth 2 (two) times daily. (Patient taking differently: Take 40 mg by mouth daily. )   gabapentin (NEURONTIN) 300 MG capsule TAKE 1 CAPSULE BY MOUTH 3 TIMES DAILY (Patient taking differently: Take 300 mg by mouth 3 (three) times daily. )   hydrALAZINE (APRESOLINE) 10 MG tablet Take 1 tablet (10 mg total) by mouth in the morning and at  bedtime.   Multiple Vitamin (MULTIVITAMIN WITH MINERALS) TABS Take 1 tablet by mouth daily. Multivitamin for Men 50+   nitroGLYCERIN (NITROSTAT) 0.4 MG SL tablet Place 1 tablet (0.4 mg total) under the tongue every 5 (five) minutes as needed for chest pain.   pantoprazole (PROTONIX) 40 MG tablet TAKE ONE TABLET TWICE DAILY   sacubitril-valsartan (ENTRESTO) 24-26 MG Take 1 tablet by mouth 2 (two) times daily.   No facility-administered encounter medications on file as of 10/13/2019.     Objective:   Goals Addressed              This Visit's Progress     Patient Stated     [name] "I need help w/ my medications" (pt-stated)        CARE PLAN ENTRY (see longitudinal plan of care for additional care plan information)  Current Barriers:   Social, financial, community barriers:  o Daughter, [name], noted confusion about current medication regimen today to CPhT  Diabetes: controlled, but not optimally managed; complicated by chronic medical conditions including HFrEF, CAD, HLD,  most recent A1c 5.9%  Most recent eGFR: ~38 mL/min  Current antihyperglycemic regimen: [name] 10 mg o Metformin recently d/c d/t worsening renal fx and HF  o APPROVED for [name] assistance through [facility]  No concerns w/ hypoglyceima  Cardiovascular risk reduction (follows w/ Dr. Fletcher Anon; most recent EF 25-30%; spiro held d/t renal fx; hx MI w/ RCA stenting) o APPROVED for Entresto assistance through [name].  o Current hypertensive regimen: furosemide 40  mg BID, carvedilol 6.25 mg BID, Entresto 24/26 mg BID; hydralazine 10 mg BID started recently o Current hyperlipidemia regimen: atorvastatin 40 mg daily, last LDL very well controlled <55  o Current antiplatelet regimen: clopidogrel 75 mg daily   GERD: pantoprazole 40 mg BID  Supplements: daily MVI, cinnamon  Pharmacist Clinical Goal(s):   Over the next 90 days, patient will work with [name] and primary care provider to address optimized  medication management  Interventions:  Comprehensive medication review performed, medication list updated in electronic medical record  Inter-disciplinary care team collaboration (see longitudinal plan of care)  Reviewed recent medication changes with [name]. She verbalizes understanding. She is also going to set up [place] access so that she can better communicate with patient's care team  Patient Self Care Activities:   Patient will check blood glucose BID, document, and provide at future appointments  Patient will take medications as prescribed  Patient will contact provider with any episodes of hypoglycemia  Patient will report any questions or concerns to provider   Please see past updates related to this goal by clicking on the "Past Updates" button in the selected goal          Plan:  - Will f/u as previously scheduled  [name], [name], [place], [place] Pharmacist [place] [phone]

## 2019-10-13 NOTE — Patient Instructions (Signed)
Visit Information  Goals Addressed              This Visit's Progress     Patient Stated   .  [name] "I need help w/ my medications" (pt-stated)        CARE PLAN ENTRY (see longitudinal plan of care for additional care plan information)  Current Barriers:  . Social, financial, community barriers:  o Daughter, [name], noted confusion about current medication regimen today to CPhT . Diabetes: controlled, but not optimally managed; complicated by chronic medical conditions including HFrEF, CAD, HLD,  most recent A1c 5.9% . Most recent eGFR: ~38 mL/min . Current antihyperglycemic regimen: [name] 10 mg o Metformin recently d/c d/t worsening renal fx and HF  o APPROVED for [name] assistance through [facility] . No concerns w/ hypoglyceima . Cardiovascular risk reduction (follows w/ Dr. Fletcher Anon; most recent EF 25-30%; spiro held d/t renal fx; hx MI w/ RCA stenting) o APPROVED for Entresto assistance through [name].  o Current hypertensive regimen: furosemide 40 mg BID, carvedilol 6.25 mg BID, Entresto 24/26 mg BID; hydralazine 10 mg BID started recently o Current hyperlipidemia regimen: atorvastatin 40 mg daily, last LDL very well controlled <55  o Current antiplatelet regimen: clopidogrel 75 mg daily  . GERD: pantoprazole 40 mg BID . Supplements: daily MVI, cinnamon  Pharmacist Clinical Goal(s):  Marland Kitchen Over the next 90 days, patient will work with [name] and primary care provider to address optimized medication management  Interventions: . Comprehensive medication review performed, medication list updated in electronic medical record . Inter-disciplinary care team collaboration (see longitudinal plan of care) . Reviewed recent medication changes with [name]. She verbalizes understanding. She is also going to set up [place] access so that she can better communicate with patient's care team  Patient Self Care Activities:  . Patient will check blood glucose BID, document, and provide at  future appointments . Patient will take medications as prescribed . Patient will contact provider with any episodes of hypoglycemia . Patient will report any questions or concerns to provider   Please see past updates related to this goal by clicking on the "Past Updates" button in the selected goal         The patient verbalized understanding of instructions provided today and declined a print copy of patient instruction materials.   Plan:  - Will f/u as previously scheduled  [name], [name], [place], [place] Pharmacist [place] [phone]

## 2019-10-13 NOTE — Patient Outreach (Signed)
[place] [facility]) Care Management  10/13/2019  [place] 1950/04/17 [id number]  Unsuccessful outreach call placed to patient in regards to medication delivery of [name] from [place] and Entresto from [name].  Unfortunately patient did not answer the phone, HIPAA compliant voicemail left.  Was calling to inquire if patient has received the [place].  Will follow up with another outreach call in 5-7 business days if call is not returned.  Nathaneil Feagans P. [name], [name]  [phone]

## 2019-10-13 NOTE — Patient Outreach (Signed)
[place] [facility] Care Management  10/13/2019  [place] 1950-10-01 [id number]  ADDENDUM  Incoming call received from patient's daughter, [name], in regards to patient's application with [name] for [employer] and [name] for [employer].  Spoke to [employer], [employer] verfied.  [name] informed her father did receive the [place]. She informed refill process was discussed with her father earlier this week at his appointment with embedded [facility] RPh [name].  She informed she attempted to set up his Nov[employer] shipment but [name] would not speak to her as they would need her father's authorization to do so. She informed her father was going to call them today to set up his shipment.  Also, informed embedded [facility] RPh [name] via secure [employer] to contact patient's daughter to discuss med changes that happened at this weeks appointment as she was not able to attend the appointment and she wanted to make sure she understood all the changes.  Will follow up with patient or his daughter in 5-10 business days to inquire if the [place] had arrived.  Kathyrn Warmuth P. Irene Collings, [place]  [phone]

## 2019-10-21 ENCOUNTER — Other Ambulatory Visit: Payer: Self-pay | Admitting: Family

## 2019-10-21 ENCOUNTER — Other Ambulatory Visit: Payer: Self-pay | Admitting: Family Medicine

## 2019-10-21 ENCOUNTER — Other Ambulatory Visit: Payer: Self-pay | Admitting: Cardiovascular Disease

## 2019-10-23 ENCOUNTER — Other Ambulatory Visit: Payer: Self-pay | Admitting: Cardiovascular Disease

## 2019-10-23 ENCOUNTER — Other Ambulatory Visit: Payer: Self-pay | Admitting: Pharmacy Technician

## 2019-10-23 MED ORDER — FUROSEMIDE 40 MG PO TABS
40.0000 mg | ORAL_TABLET | Freq: Every day | ORAL | 3 refills | Status: DC
Start: 2019-10-23 — End: 2020-09-12

## 2019-10-23 NOTE — Telephone Encounter (Signed)
°*  STAT* If patient is at the pharmacy, call can be transferred to refill team.   1. Which medications need to be refilled? (please list name of each medication and dose if known)   Lasix   2. Which pharmacy/location (including street and city if local pharmacy) is medication to be sent to?  [facility]   3. Do they need a 30 day or 90 day supply? [place]

## 2019-10-23 NOTE — Patient Outreach (Signed)
[place] [facility]) Care Management  10/23/2019  Devin Hanna January 11, 1951 [id number]  Successful outreach call placed to patient's daughter Devin Hanna in regards to the receipt of Entresto from [name].  Spoke to [employer], [employer] verified.  Devin Hanna informed her father had received the [place]. Discussed refill procedure with Devin Hanna which will require her father to call Novartis when he has around a 2-3 week supply remaining. Devin Hanna verbalized understanding. Also re reviewed the refill policy for [name] with [name]. Informed her that patient was set up for auto fills so the medication should come automatically but if he only has a 2-3 week supply and they have not received the medication or heard from the company, then patient will need to outreach [name]. Devin Hanna again verbalized understanding.  Confirmed Devin Hanna had name and number as she had no other questions or concerns relating to patient assistance.  Will route note to embedded [facility] RPh Devin Hanna that patient assistance is completed and case will be closed.  Devin Hanna, [place]  [phone]

## 2019-10-25 ENCOUNTER — Ambulatory Visit (INDEPENDENT_AMBULATORY_CARE_PROVIDER_SITE_OTHER): Payer: PPO | Admitting: *Deleted

## 2019-10-25 ENCOUNTER — Telehealth: Payer: Self-pay | Admitting: Pharmacist

## 2019-10-25 ENCOUNTER — Ambulatory Visit (INDEPENDENT_AMBULATORY_CARE_PROVIDER_SITE_OTHER): Payer: PPO | Admitting: Pharmacist

## 2019-10-25 ENCOUNTER — Other Ambulatory Visit: Payer: Self-pay

## 2019-10-25 VITALS — BP 140/70 | HR 70 | Temp 98.2°F | Resp 18

## 2019-10-25 DIAGNOSIS — I10 Essential (primary) hypertension: Secondary | ICD-10-CM

## 2019-10-25 DIAGNOSIS — I5022 Chronic systolic (congestive) heart failure: Secondary | ICD-10-CM | POA: Diagnosis not present

## 2019-10-25 DIAGNOSIS — I25118 Atherosclerotic heart disease of native coronary artery with other forms of angina pectoris: Secondary | ICD-10-CM

## 2019-10-25 DIAGNOSIS — E1142 Type 2 diabetes mellitus with diabetic polyneuropathy: Secondary | ICD-10-CM | POA: Diagnosis not present

## 2019-10-25 MED ORDER — HYDRALAZINE HCL 25 MG PO TABS: 25.0000 mg | ORAL_TABLET | Freq: Two times a day (BID) | ORAL | 2 refills | Status: DC

## 2019-10-25 MED ORDER — TRULICITY 0.75 MG/0.5ML ~~LOC~~ SOAJ: 0.7500 mg | SUBCUTANEOUS | 2 refills | Status: DC

## 2019-10-25 NOTE — Chronic Care Management (AMB) (Signed)
Chronic Care Management   Follow Up Note   10/25/2019 Name: Devin Hanna MRN: [id number] DOB: [birth year]  Referred by: [name], MD Reason for referral : Chronic Care Management (Medication Management)   Devin Hanna is a 69 y.o. year old male who is a primary care patient of [name], Angela Adam, MD. The CCM team was consulted for assistance with chronic disease management and care coordination needs.     Patient brought home BP/BG log to provider's office today during appt for BP check.   Review of patient status, including review of consultants reports, relevant laboratory and other test results, and collaboration with appropriate care team members and the patient's provider was performed as part of comprehensive patient evaluation and provision of chronic care management services.    SDOH (Social Determinants of Health) assessments performed: Yes See Care Plan activities for detailed interventions related to SDOH)  SDOH Interventions     Most Recent Value  SDOH Interventions  Financial Strain Interventions Other (Comment)  [manufacturer assistance]       Outpatient Encounter Medications as of 10/25/2019  Medication Sig  . atorvastatin (LIPITOR) 40 MG tablet Take 1 tablet (40 mg total) by mouth daily.  . carvedilol (COREG) 3.125 MG tablet Take 3.125 mg by mouth 2 (two) times daily with a meal.  . CINNAMON PO Take 1,000 mg by mouth in the morning and at bedtime.  . clopidogrel (PLAVIX) 75 MG tablet TAKE ONE TABLET EVERY MORNING WITH BREAKFAST  . dapagliflozin propanediol ([name]) 10 MG TABS tablet Take 1 tablet (10 mg total) by mouth daily before breakfast.  . Dulaglutide ([name]) 4.09 WJ/1.9JY SOPN Inject 0.75 mg into the skin once a week.  . furosemide (LASIX) 40 MG tablet Take 1 tablet (40 mg total) by mouth daily.  Marland Kitchen gabapentin (NEURONTIN) 300 MG capsule Take 1 capsule (300 mg total) by mouth 3 (three) times daily.  . hydrALAZINE (APRESOLINE) 25 MG tablet Take  1 tablet (25 mg total) by mouth in the morning and at bedtime.  . Multiple Vitamin (MULTIVITAMIN WITH MINERALS) TABS Take 1 tablet by mouth daily. Multivitamin for Men 60+  . nitroGLYCERIN (NITROSTAT) 0.4 MG SL tablet Place 1 tablet (0.4 mg total) under the tongue every 5 (five) minutes as needed for chest pain.  . pantoprazole (PROTONIX) 40 MG tablet TAKE ONE TABLET TWICE DAILY  . sacubitril-valsartan (ENTRESTO) 24-26 MG Take 1 tablet by mouth 2 (two) times daily.  Marland Kitchen spironolactone (ALDACTONE) 25 MG tablet TAKE HALF A TABLET BY MOUTH EVERY DAY  . [DISCONTINUED] hydrALAZINE (APRESOLINE) 10 MG tablet Take 1 tablet (10 mg total) by mouth in the morning and at bedtime.   No facility-administered encounter medications on file as of 10/25/2019.     Objective:   Goals Addressed              This Visit's Progress     Patient Stated   .  [name] "I need help w/ my medications" (pt-stated)        CARE PLAN ENTRY (see longitudinal plan of care for additional care plan information)  Current Barriers:  . Social, financial, community barriers:  o None noted at this time. Received approval for medication assistance.  . Diabetes: controlled per A1c, but uncontrolled per recent BG; complicated by chronic medical conditions including HFrEF, CAD, HLD,  most recent A1c 5.9% . Most recent eGFR: ~38 mL/min . Current antihyperglycemic regimen: [name] 10 mg o Metformin recently d/c d/t worsening renal fx and HF  o APPROVED for [name] assistance through [place] . Current glucose readings:   Fasting Post prandial  3-Sep 183 247  4-Sep 177 230  5-Sep 176 240  6-Sep 197 250  7-Sep 196 311  8-Sep 175 398  9-Sep 159 277  10-Sep 199 295  11-Sep 198 254  12-Sep 185 189  13-Sep 174 244  14-Sep 194 280  15-Sep 193   AVG 185 267   . Cardiovascular risk reduction (follows w/ Dr. Fletcher Anon; most recent EF 25-30%; spiro held d/t renal fx; hx MI w/ RCA stenting) o APPROVED for Entresto assistance through  [name].  o Current hypertensive regimen: furosemide 40 mg BID; carvedilol 6.25 mg BID, Entresto 24/26 mg BID; hydralazine 10 mg BID  SBP DBP HR  3-Sep 133 67 58  4-Sep 115 66 69  5-Sep 135 73 61  6-Sep 149 70 62  7-Sep 146 77 62  8-Sep 142 72 64  9-Sep 160 74 62  10-Sep 143 125 60  11-Sep 155 73 62  12-Sep 154 79 62  13-Sep 159 80 62  14-Sep 144 69 66  15-Sep 134 72 61  AVG 144 77 62   o Current hyperlipidemia regimen: atorvastatin 40 mg daily, last LDL very well controlled <55  o Current antiplatelet regimen: clopidogrel 75 mg daily  . GERD: pantoprazole 40 mg BID . Supplements: daily MVI, cinnamon  Pharmacist Clinical Goal(s):  Marland Kitchen Over the next 90 days, patient will work with [name] and primary care provider to address optimized medication management  Interventions: . Comprehensive medication review performed, medication list updated in electronic medical record . Inter-disciplinary care team collaboration (see longitudinal plan of care) . BG uncontrolled. As previously discussed, start [name] 4.36 mg weekly. Sample provided for patient to determine tolerability. Will pursue patient assistance through [employer]. Will collaborate w/ patient and provider on application. Will collaborate w/ CPhT for follow up/. Demonstrated injection technique for patient previously.  . BP still uncontrolled. Increase hydralazine to 25 mg BID. Will encouraged patient to alternate times of the day of checking BP (and to document time) to determine if mid-day dose needed.  Kandice Robinsons patient's daughter, [name], reviewed the above including GLP1 mechanism and side effects. She verbalized understanding. Called patient, LVM for him to return my call at his convenience.   Patient Self Care Activities:  . Patient will check blood glucose BID, document, and provide at future appointments . Patient will take medications as prescribed . Patient will contact provider with any episodes of  hypoglycemia . Patient will report any questions or concerns to provider   Please see past updates related to this goal by clicking on the "Past Updates" button in the selected goal          Plan:  - Will outreach patient as previously scheduled for f/u  [name], [name], [place], [place] Pharmacist [place] [phone]

## 2019-10-25 NOTE — Progress Notes (Signed)
Patient here for nurse visit BP check per order from 10/10/19.   Patient reports compliance with prescribed BP medications: yes Started Hydralazine as prescribed 10/10/19 Last dose of BP medication: This am around 7   BP Readings from Last 3 Encounters:  10/25/19 (!) 148/70  10/10/19 120/70  09/07/19 120/70   Pulse Readings from Last 3 Encounters:  10/25/19 62  10/10/19 66  09/07/19 72    Patient left copy of CBG and BP reading copy given to PCP and copy to Pharm D for follow up on CBGs.  Patient verbalized understanding of instructions.   Kerin Salen, RN

## 2019-10-25 NOTE — Patient Instructions (Signed)
Devin Hanna,   We are going to make 2 changes today:   1) START [name] 1.85 mg once weekly. I am giving you 1 month (4 pens for 4 weeks) of samples. We are also going to apply for patient assistance from the manufacturer [employer]), since this is also an expensive medication.   2) INCREASE hydralazine to 25 mg twice daily. Please stop the hydralazine 10 mg tablets. Please also make note of what time you are checking your blood pressure - I would suggest alternating times (sometimes check in the morning, sometimes check later in the day). I'm giving you new logs to document this.   Please call me with any questions or concerns!  Devin Hanna, [name] [phone]  Visit Information  Goals Addressed              This Visit's Progress     Patient Stated   .  [name] "I need help w/ my medications" (pt-stated)        CARE PLAN ENTRY (see longitudinal plan of care for additional care plan information)  Current Barriers:  . Social, financial, community barriers:  o None noted at this time. Received approval for medication assistance.  . Diabetes: controlled per A1c, but uncontrolled per recent BG; complicated by chronic medical conditions including HFrEF, CAD, HLD,  most recent A1c 5.9% . Most recent eGFR: ~38 mL/min . Current antihyperglycemic regimen: [name] 10 mg o Metformin recently d/c d/t worsening renal fx and HF  o APPROVED for [name] assistance through [place] . Current glucose readings:   Fasting Post prandial  3-Sep 183 247  4-Sep 177 230  5-Sep 176 240  6-Sep 197 250  7-Sep 196 311  8-Sep 175 398  9-Sep 159 277  10-Sep 199 295  11-Sep 198 254  12-Sep 185 189  13-Sep 174 244  14-Sep 194 280  15-Sep 193   AVG 185 267   . Cardiovascular risk reduction (follows w/ Dr. Fletcher Anon; most recent EF 25-30%; spiro held d/t renal fx; hx MI w/ RCA stenting) o APPROVED for Entresto assistance through [name].  o Current hypertensive regimen: furosemide 40 mg BID;  carvedilol 6.25 mg BID, Entresto 24/26 mg BID; hydralazine 10 mg BID  SBP DBP HR  3-Sep 133 67 58  4-Sep 115 66 69  5-Sep 135 73 61  6-Sep 149 70 62  7-Sep 146 77 62  8-Sep 142 72 64  9-Sep 160 74 62  10-Sep 143 125 60  11-Sep 155 73 62  12-Sep 154 79 62  13-Sep 159 80 62  14-Sep 144 69 66  15-Sep 134 72 61  AVG 144 77 62   o Current hyperlipidemia regimen: atorvastatin 40 mg daily, last LDL very well controlled <55  o Current antiplatelet regimen: clopidogrel 75 mg daily  . GERD: pantoprazole 40 mg BID . Supplements: daily MVI, cinnamon  Pharmacist Clinical Goal(s):  Marland Kitchen Over the next 90 days, patient will work with [name] and primary care provider to address optimized medication management  Interventions: . Comprehensive medication review performed, medication list updated in electronic medical record . Inter-disciplinary care team collaboration (see longitudinal plan of care) . BG uncontrolled. As previously discussed, start [name] 7.85 mg weekly. Sample provided for patient to determine tolerability. Will pursue patient assistance through [employer]. Will collaborate w/ patient and provider on application. Will collaborate w/ CPhT for follow up/. Demonstrated injection technique for patient previously.  . BP still uncontrolled. Increase hydralazine to 25 mg BID. Will  encouraged patient to alternate times of the day of checking BP (and to document time) to determine if mid-day dose needed.  Devin Hanna patient's daughter, Devin Hanna, reviewed the above including GLP1 mechanism and side effects. She verbalized understanding.   Patient Self Care Activities:  . Patient will check blood glucose BID, document, and provide at future appointments . Patient will take medications as prescribed . Patient will contact provider with any episodes of hypoglycemia . Patient will report any questions or concerns to provider   Please see past updates related to this goal by clicking on the "Past  Updates" button in the selected goal         The patient verbalized understanding of instructions provided today and declined a print copy of patient instruction materials.   Plan:  - Will outreach patient as previously scheduled  Devin Hanna, [name], [place], [place] Pharmacist [place] [phone]

## 2019-10-25 NOTE — Telephone Encounter (Signed)
Medication Samples have been logged, labeled, and placed in the sample refrigerator for the patient.   There is paperwork for patient to sign at the front desk.  Drug name: [name]       Strength: 0.75 mg        Qty: 2 pens  LOT: [id number]  Exp.Date: 08/22/20

## 2019-10-25 NOTE — Telephone Encounter (Signed)
Patient picked up sample 

## 2019-10-26 NOTE — Progress Notes (Signed)
I have reviewed the above note and agree. Clinical pharmacist contacted the patient and advised on medication changes. Please see her CCM note for this information.   [name], M.D.

## 2019-11-06 ENCOUNTER — Ambulatory Visit: Payer: PPO | Admitting: Pharmacist

## 2019-11-06 DIAGNOSIS — I5022 Chronic systolic (congestive) heart failure: Secondary | ICD-10-CM

## 2019-11-06 DIAGNOSIS — E1142 Type 2 diabetes mellitus with diabetic polyneuropathy: Secondary | ICD-10-CM | POA: Diagnosis not present

## 2019-11-06 DIAGNOSIS — I10 Essential (primary) hypertension: Secondary | ICD-10-CM | POA: Diagnosis not present

## 2019-11-06 DIAGNOSIS — I25118 Atherosclerotic heart disease of native coronary artery with other forms of angina pectoris: Secondary | ICD-10-CM | POA: Diagnosis not present

## 2019-11-06 DIAGNOSIS — N1832 Chronic kidney disease, stage 3b: Secondary | ICD-10-CM

## 2019-11-06 NOTE — Chronic Care Management (AMB) (Signed)
Chronic Care Management   Follow Up Note   11/06/2019 Name: Devin Hanna MRN: [id number] DOB: 1950-10-08  Referred by: Devin Haven, MD Reason for referral : Chronic Care Management (Medication Management)   Devin Hanna is a 69 y.o. year old male who is a primary care patient of Devin Hanna, Devin Adam, MD. The CCM team was consulted for assistance with chronic disease management and care coordination needs.    Contacted patient for medication management review.  Review of patient status, including review of consultants reports, relevant laboratory and other test results, and collaboration with appropriate care team members and the patient's provider was performed as part of comprehensive patient evaluation and provision of chronic care management services.    SDOH (Social Determinants of Health) assessments performed: Yes See Care Plan activities for detailed interventions related to SDOH)  SDOH Interventions     Most Recent Value  SDOH Interventions  Financial Strain Interventions Other (Comment)  [manufacturer assistance]       Outpatient Encounter Medications as of 11/06/2019  Medication Sig  . clopidogrel (PLAVIX) 75 MG tablet TAKE ONE TABLET EVERY MORNING WITH BREAKFAST  . dapagliflozin propanediol ([name]) 10 MG TABS tablet Take 1 tablet (10 mg total) by mouth daily before breakfast.  . Dulaglutide (Devin Hanna) 3.88 EK/8.0KL SOPN Inject 0.75 mg into the skin once a week.  . furosemide (LASIX) 40 MG tablet Take 1 tablet (40 mg total) by mouth daily.  Marland Kitchen gabapentin (NEURONTIN) 300 MG capsule Take 1 capsule (300 mg total) by mouth 3 (three) times daily.  . hydrALAZINE (APRESOLINE) 25 MG tablet Take 1 tablet (25 mg total) by mouth in the morning and at bedtime.  . Multiple Vitamin (MULTIVITAMIN WITH MINERALS) TABS Take 1 tablet by mouth daily. Multivitamin for Men 9+  . pantoprazole (PROTONIX) 40 MG tablet TAKE ONE TABLET TWICE DAILY  . sacubitril-valsartan (ENTRESTO)  24-26 MG Take 1 tablet by mouth 2 (two) times daily.  Marland Kitchen spironolactone (ALDACTONE) 25 MG tablet TAKE HALF A TABLET BY MOUTH EVERY DAY  . atorvastatin (LIPITOR) 40 MG tablet Take 1 tablet (40 mg total) by mouth daily.  . carvedilol (COREG) 3.125 MG tablet Take 3.125 mg by mouth 2 (two) times daily with a meal.  . CINNAMON PO Take 1,000 mg by mouth in the morning and at bedtime.  . nitroGLYCERIN (NITROSTAT) 0.4 MG SL tablet Place 1 tablet (0.4 mg total) under the tongue every 5 (five) minutes as needed for chest pain.   No facility-administered encounter medications on file as of 11/06/2019.     Objective:   Goals Addressed              This Visit's Progress     Patient Stated   .  [name] "I need help w/ my medications" (pt-stated)        CARE PLAN ENTRY (see longitudinal plan of care for additional care plan information)  Current Barriers:  . Social, financial, community barriers:  o Denies any concerns at this Devin. Receiving patient assistance for Devin Hanna, and Devin Hanna. Devin Hanna order to arrive on Wednesday.  . Diabetes: controlled per A1c, but uncontrolled per recent BG; complicated by chronic medical conditions including HFrEF, CAD, HLD,  most recent A1c 5.9% . Most recent eGFR: ~38 mL/min . Current antihyperglycemic regimen: [name] 10 mg, Devin Hanna 4.91 mg weekly x 2 weeks (due for dose 3 this Wednesday).  o Metformin recently d/c d/t worsening renal fx and HF  o APPROVED for [name] assistance through Devin [facility]anna [employer]  [facility] . Current glucose readings:   Fasting After Supper  20-Sep 194 310  21-Sep 222 253  22-Sep 181 405  23-Sep 221 238  24-Sep  248  25-Sep 209 298  26-Sep 177 231  27-Sep 194    199 283   . Current meal patterns: o Breakfast: 2 eggs, bacon; sometimes egg sandwich o Lunch: 2-3 vegetables o Supper: meat + vegetables o Drink: water, occasionally diet cranberry juice (1/2 glass daily) o Desserts/snacks: sometimes 3 peanut butter crackers at night,  small bag of popcorn; sometimes celery and some diet ranch; sometimes just a lettuce sandwich . Cardiovascular risk reduction (follows w/ Dr. Fletcher Hanna; most recent EF 25-30%; spironolactone held d/t renal fx; hx MI w/ RCA stenting) o APPROVED for Entresto assistance through Devin Hanna.  o Current hypertensive regimen: furosemide 40 mg BID; carvedilol 6.25 mg BID, Entresto 24/26 mg BID; hydralazine 25 mg BID  AM SBP AM DBP AM HR PM SBP PM DBP PM HR  20-Sep 142 62 67     21-Sep 130 66 65 131 64 53  22-Sep 108 54 68 101 44 64  23-Sep 124 57 63 113 56 65  24-Sep 142 68 68     25-Sep 138 69 65 103 48 68  26-Sep 146 77 67     27-Sep         134 65 66 112 53 63   o Current hyperlipidemia regimen: atorvastatin 40 mg daily, last LDL very well controlled <55  o Current antiplatelet regimen: clopidogrel 75 mg daily  . GERD: pantoprazole 40 mg BID . Supplements: daily MVI, cinnamon  Pharmacist Clinical Goal(s):  Marland Kitchen Over the next 90 days, patient will work with [name] and primary care provider to address optimized medication management  Interventions: . Comprehensive medication review performed, medication list updated in electronic medical record . Inter-disciplinary care team collaboration (see longitudinal plan of care) . Reviewed that it can take ~4-5 weeks for full benefit of Devin Hanna to be seen. Continue Devin Hanna 1.85 mg weekly, [name] 10 mg daily. Discussed that Devin Hanna dose can be increased moving forward as needed to target goal blood sugars . Extensive dietary review. Praised for focus on appropriate portion sizes.  . Discussed goal BP. Patient denies any s/sx hypotension such as lightheadedness, dizziness. Continue current regimen. He has cardiology f/u later this week.   Patient Self Care Activities:  . Patient will check blood glucose BID, document, and provide at future appointments . Patient will take medications as prescribed . Patient will contact provider with any episodes of  hypoglycemia . Patient will report any questions or concerns to provider   Please see past updates related to this goal by clicking on the "Past Updates" button in the selected goal          Plan:  - Scheduled f/u call in ~ 6 weeks  [name], [name], [place], [place] Pharmacist [place] [phone]

## 2019-11-06 NOTE — Patient Instructions (Addendum)
Devin Hanna,   It was great talking with you today!  Keep taking [name] 5.80 mg weekly and [name] 10 mg daily. With a weekly medication, it can take 4-5 weeks for the full benefit of the medication to be seen. [name] also comes as higher doses, so we can increase the dose moving forward if needed.   Your blood pressures are looking much better controlled. Make sure and take these readings to your cardiology appointment. PLEASE let us know if you start to develop any symptoms of LOW blood pressure (dizziness or lightheadedness especially upon standing).   We'll talk next time about the plan to re-enroll for patient assistance for [name], Devin Hanna, and [name].  Take care!  Devin Hanna, [name] [phone]  Visit Information  Goals Addressed              This Visit's Progress     Patient Stated   .  [name] "I need help w/ my medications" (pt-stated)        CARE PLAN ENTRY (see longitudinal plan of care for additional care plan information)  Current Barriers:  . Social, financial, community barriers:  o Denies any concerns at this time. Receiving patient assistance for [name], and [name]. [name] order to arrive on Wednesday.  . Diabetes: controlled per A1c, but uncontrolled per recent BG; complicated by chronic medical conditions including HFrEF, CAD, HLD,  most recent A1c 5.9% . Most recent eGFR: ~38 mL/min . Current antihyperglycemic regimen: [name] 10 mg, [name] 3.97 mg weekly x 2 weeks (due for dose 3 this Wednesday).  o Metformin recently d/c d/t worsening renal fx and HF  o APPROVED for [name] assistance through [place] . Current glucose readings:   Fasting After Supper  20-Sep 194 310  21-Sep 222 253  22-Sep 181 405  23-Sep 221 238  24-Sep  248  25-Sep 209 298  26-Sep 177 231  27-Sep 194    199 283   . Current meal patterns: o Breakfast: 2 eggs, bacon; sometimes egg sandwich o Lunch: 2-3 vegetables o Supper: meat +  vegetables o Drink: water, occasionally diet cranberry juice (1/2 glass daily) o Desserts/snacks: sometimes 3 peanut butter crackers at night, small bag of popcorn; sometimes celery and some diet ranch; sometimes just a lettuce sandwich . Cardiovascular risk reduction (follows w/ Dr. Fletcher Anon; most recent EF 25-30%; spironolactone held d/t renal fx; hx MI w/ RCA stenting) o APPROVED for Entresto assistance through [name].  o Current hypertensive regimen: furosemide 40 mg BID; carvedilol 6.25 mg BID, Entresto 24/26 mg BID; hydralazine 25 mg BID  AM SBP AM DBP AM HR PM SBP PM DBP PM HR  20-Sep 142 62 67     21-Sep 130 66 65 131 64 53  22-Sep 108 54 68 101 44 64  23-Sep 124 57 63 113 56 65  24-Sep 142 68 68     25-Sep 138 69 65 103 48 68  26-Sep 146 77 67     27-Sep         134 65 66 112 53 63   o Current hyperlipidemia regimen: atorvastatin 40 mg daily, last LDL very well controlled <55  o Current antiplatelet regimen: clopidogrel 75 mg daily  . GERD: pantoprazole 40 mg BID . Supplements: daily MVI, cinnamon  Pharmacist Clinical Goal(s):  Marland Kitchen Over the next 90 days, patient will work with [name] and primary care provider to address optimized medication management  Interventions: . Comprehensive medication review performed, medication list updated in  electronic medical record . Inter-disciplinary care team collaboration (see longitudinal plan of care) . Reviewed that it can take ~4-5 weeks for full benefit of [name] to be seen. Continue [name] 2.48 mg weekly, [name] 10 mg daily. Discussed that [name] dose can be increased moving forward as needed to target goal blood sugars . Extensive dietary review. Praised for focus on appropriate portion sizes.  . Discussed goal BP. Patient denies any s/sx hypotension such as lightheadedness, dizziness. Continue current regimen. He has cardiology f/u later this week.   Patient Self Care Activities:  . Patient will check blood glucose BID,  document, and provide at future appointments . Patient will take medications as prescribed . Patient will contact provider with any episodes of hypoglycemia . Patient will report any questions or concerns to provider   Please see past updates related to this goal by clicking on the "Past Updates" button in the selected goal         The patient verbalized understanding of instructions provided today and declined a print copy of patient instruction materials.  Plan:  - Scheduled f/u call in ~ 6 weeks  Devin Hanna, [name], [place], [place] Pharmacist [place] [phone]

## 2019-11-07 ENCOUNTER — Telehealth: Payer: Self-pay | Admitting: Family Medicine

## 2019-11-07 NOTE — Telephone Encounter (Signed)
I called and LVM for the patients daughter to call me back.  Jalah Warmuth,cma

## 2019-11-07 NOTE — Telephone Encounter (Signed)
Patient's daughter called in wanted to know if Dr.Sonnenberg has taken him of this medicationspironolactone (ALDACTONE) 25 MG tablet

## 2019-11-07 NOTE — Telephone Encounter (Signed)
He should no longer be on the spironolactone. I am not sure why this was refilled by recently as it was done per the refill protocol. Please advise them to stop it if he has been taking it recently.

## 2019-11-08 NOTE — Telephone Encounter (Signed)
I called and spoke with the patient's daughter and informed her that the patient is no longer on the spironolactone and she should call the pharmacy and inform them to stop refilling it.  She understood. Anadalay Macdonell,cma

## 2019-11-09 ENCOUNTER — Encounter: Payer: Self-pay | Admitting: Family

## 2019-11-09 ENCOUNTER — Other Ambulatory Visit: Payer: Self-pay

## 2019-11-09 ENCOUNTER — Ambulatory Visit (INDEPENDENT_AMBULATORY_CARE_PROVIDER_SITE_OTHER): Payer: PPO | Admitting: Family

## 2019-11-09 VITALS — BP 118/68 | HR 68 | Ht 70.0 in | Wt 255.0 lb

## 2019-11-09 DIAGNOSIS — N1832 Chronic kidney disease, stage 3b: Secondary | ICD-10-CM | POA: Diagnosis not present

## 2019-11-09 DIAGNOSIS — I5042 Chronic combined systolic (congestive) and diastolic (congestive) heart failure: Secondary | ICD-10-CM

## 2019-11-09 DIAGNOSIS — E785 Hyperlipidemia, unspecified: Secondary | ICD-10-CM

## 2019-11-09 DIAGNOSIS — Z79899 Other long term (current) drug therapy: Secondary | ICD-10-CM

## 2019-11-09 DIAGNOSIS — I25118 Atherosclerotic heart disease of native coronary artery with other forms of angina pectoris: Secondary | ICD-10-CM

## 2019-11-09 DIAGNOSIS — I10 Essential (primary) hypertension: Secondary | ICD-10-CM | POA: Diagnosis not present

## 2019-11-09 NOTE — Progress Notes (Signed)
Office Visit    Patient Name: Devin Hanna Date of Encounter: 11/09/2019  Primary Care Provider:  [name], MD Primary Cardiologist:  [name], MD Electrophysiologist:  None   Chief Complaint    Devin Hanna is a 69 y.o. male with a hx of coronary disease, chronic systolic heart failure, left bundle branch block, peripheral neuropathy, prior GI bleed, GERD, DM2, HTN, HLD presents today for follow up of heart failure.   Past Medical History    Past Medical History:  Diagnosis Date  . Acute respiratory failure with hypoxia ([place]) 06/2016  . CAD (coronary artery disease)    a. Remote PCI of the RCA;  b. 03/2013 Inf STEMI/PCI: LAD 60-70d, RCA 99p ISR(rota/CBA/DES);  c. 09/2014 MV: inf and infsept infarct, EF 35-40%;  d. 09/2014 Cath: patent RCA stent, stable LAD dzs, EF 40%-->Med rx.  . CHF (congestive heart failure) ([place])   . Diabetes mellitus    sees Dr. Hardin Negus in [place]  . GERD (gastroesophageal reflux disease)   . HFrEF (heart failure with reduced ejection fraction) ([place])   . Hyperlipidemia   . Hypertension   . Ischemic cardiomyopathy    a. 09/2014 EF 40% by V gram;  b. 06/2016 Echo: EF 40, diff HK, Gr1 DD, inf HK, no mural thrombus. Mild MR, mildly dil LA, mildly dil RV.   . LBBB (left bundle branch block)    Old  . Liver mass   . Morbid obesity ([place])   . Peripheral neuropathy   . Upper GI bleed   . Vertigo    Past Surgical History:  Procedure Laterality Date  . CARDIAC CATHETERIZATION N/A 10/03/2014   Procedure: Left Heart Cath and Coronary Angiography;  Surgeon: [name], MD;  Location: [place] CV LAB;  Service: Cardiovascular;  Laterality: N/A;  . CIRCUMCISION N/A 07/22/2015   Procedure: CIRCUMCISION ADULT;  Surgeon: Hollice Espy, MD;  Location: [facility] ORS;  Service: Urology;  Laterality: N/A;  . COLONOSCOPY WITH PROPOFOL N/A 01/12/2019   Procedure: COLONOSCOPY WITH PROPOFOL;  Surgeon: Lucilla Lame, MD;  Location: [facility] ENDOSCOPY;   Service: Endoscopy;  Laterality: N/A;  . CORONARY STENT PLACEMENT    . ESOPHAGOGASTRODUODENOSCOPY (EGD) WITH PROPOFOL N/A 09/12/2014   Procedure: ESOPHAGOGASTRODUODENOSCOPY (EGD) WITH PROPOFOL;  Surgeon: Ronald Lobo, MD;  Location: [facility] ENDOSCOPY;  Service: Endoscopy;  Laterality: N/A;  . EXTERNAL FIXATION LEG Left 04/03/2012   Procedure: EXTERNAL FIXATION LEG;  Surgeon: Wylene Simmer, MD;  Location: [place];  Service: Orthopedics;  Laterality: Left;  . EXTERNAL FIXATION REMOVAL Left 04/14/2012   Procedure: REMOVAL EXTERNAL FIXATION LEG;  Surgeon: Wylene Simmer, MD;  Location: [place];  Service: Orthopedics;  Laterality: Left;  . IRRIGATION AND DEBRIDEMENT KNEE Left 04/03/2012   Procedure: IRRIGATION AND DEBRIDEMENT KNEE;  Surgeon: Wylene Simmer, MD;  Location: [place];  Service: Orthopedics;  Laterality: Left;  . LEFT HEART CATHETERIZATION WITH CORONARY ANGIOGRAM N/A 04/05/2013   Procedure: LEFT HEART CATHETERIZATION WITH CORONARY ANGIOGRAM;  Surgeon: [name], MD;  Location: [place] CATH LAB;  Service: Cardiovascular;  Laterality: N/A;  . MASS EXCISION N/A 07/22/2015   Procedure: EXCISION PENILE MASS;  Surgeon: Hollice Espy, MD;  Location: [facility] ORS;  Service: Urology;  Laterality: N/A;  . ORIF TIBIA PLATEAU Left 04/14/2012   Procedure: OPEN REDUCTION INTERNAL FIXATION (ORIF) TIBIAL PLATEAU;  Surgeon: Wylene Simmer, MD;  Location: [place];  Service: Orthopedics;  Laterality: Left;  . PERCUTANEOUS CORONARY ROTOBLATOR INTERVENTION (PCI-R) N/A 04/06/2013   Procedure: PERCUTANEOUS  CORONARY ROTOBLATOR INTERVENTION (PCI-R);  Surgeon: [name], MD;  Location: [facility] CATH LAB;  Service: Cardiovascular;  Laterality: N/A;  . RIGHT/LEFT HEART CATH AND CORONARY ANGIOGRAPHY N/A 08/21/2019   Procedure: RIGHT/LEFT HEART CATH AND CORONARY ANGIOGRAPHY;  Surgeon: [name], MD;  Location: [place] CV LAB;  Service: Cardiovascular;  Laterality: N/A;    Allergies  No Known Allergies  History of Present Illness      Devin Hanna is a 69 y.o. male with a hx of coronary disease, chronic systolic heart failure, left bundle branch block, peripheral neuropathy, prior GI bleed, GERD, DM2, HTN, HLD.  He 09/07/2019  He has known CAD with RCA stents in the past and prior MI.  He was hospitalized May 2018 for acute on chronic systolic heart failure.  Echo June 2020 with LVEF 35 to 40% which is stable compared to previous.  He was transition from ace inhibitor to [place] at that time.  He had hypotension on higher dose of Entresto and had to be decreased.    Seen in clinic 06/29/19 reported feeling overall well.  Was noted to have been diagnosed with liver lesion that was too small to biopsy and is being monitored.  He was admitted 07/30/2019-07/12/2019 for shortness of breath.  Due to kidney injury his Entresto and spironolactone were held and his Lasix was increased to 40 mg twice daily.  Underwent echocardiogram 07/31/2019 with LVEF 25-30%, LV mildly dilated, grade 1 diastolic dysfunction, trivial MR. He was recommended for outpatient stress testing as his EF had dropped to rule out ischemia as origin. Seen in clinic 08/08/19 feeling overall well. Lexiscan myoview 08/15/19 showed prior scar with apical defect more severe than previous. He was recommended for cardiac catherization. L/RHC 08/21/19 with patent RCA stent with mild in-stent restenosis. Moderate disease of left system with overall moderate calcifications. No evidence of obstructive disease. RHC with mildly elevated filling pressures, mild pulmonary HTN 31/13 mmhg, high normal cardiac output. Cardiomyopathy was thought to be out of proportion to coronary disease.   Seen in follow-up 09/07/2019 was noted to have been started on [name] by his primary care provider.  His catheterization site was healing appropriately.  Subsequent lab work with renal function stable and BNP improved, he was recommended to reduce his furosemide to 40 mg once daily.  Renal artery duplex  10/03/2019 with no evidence of renal artery stenosis bilaterally though noted abnormal bilateral resistive index.  He was recommended to follow-up with his nephrologist.  Presents today for follow-up.  Since last seen he has been started on hydralazine 25 mg twice daily by his primary care provider due to elevated blood pressure.  Reports he is tolerating this well.  Tells me blood pressure at home is routinely 140s though does check prior to medications.  Reports no shortness of breath at rest.  Endorses stable dyspnea on exertion.. Reports no chest pain, pressure, or tightness. No edema, orthopnea, PND. Reports no palpitations.   Tells me his exercise tolerance is predominantly limited by neuropathy.  He is weighing every day at home and reports weights have been stable.  EKGs/Labs/Other Studies Reviewed:   The following studies were reviewed today:  Echo 07/31/19  1. Technically difficult; definity used; severe global reduction in LV  systolic function; grade 1 diastolic dysfunction; mild LVE.   2. Left ventricular ejection fraction, by estimation, is 25 to 30%. The  left ventricle has severely decreased function. The left ventricle  demonstrates global hypokinesis. The left ventricular internal  cavity size  was mildly dilated. Left ventricular  diastolic parameters are consistent with Grade I diastolic dysfunction  (impaired relaxation).   3. Right ventricular systolic function is normal. The right ventricular  size is normal.   4. The mitral valve is normal in structure. Trivial mitral valve  regurgitation. No evidence of mitral stenosis.   5. The aortic valve has an indeterminant number of cusps. Aortic valve  regurgitation is not visualized. No aortic stenosis is present.   6. The inferior vena cava is normal in size with greater than 50%  respiratory variability, suggesting right atrial pressure of 3 mmHg.    EKG: No EKG is ordered today.  The ekg independently reviewed from  09/07/2019  demonstrates NSR 67 bpmwith LBBB and no acute St/T wave changes.   Recent Labs: 08/15/2019: ALT 32; Hemoglobin 12.7; Platelets 161 09/07/2019: BNP 144.7 09/29/2019: BUN 21; Creatinine, Ser 1.83; Potassium 4.6; Sodium 141  Recent Lipid Panel    Component Value Date/Time   CHOL 130 04/03/2019 0837   CHOL 103 06/07/2014 0824   TRIG 199.0 (H) 04/03/2019 0837   HDL 45.90 04/03/2019 0837   HDL 38 (L) 06/07/2014 0824   CHOLHDL 3 04/03/2019 0837   VLDL 39.8 04/03/2019 0837   LDLCALC 44 04/03/2019 0837   LDLCALC 59 03/30/2018 0930    Home Medications   Current Meds  Medication Sig  . atorvastatin (LIPITOR) 40 MG tablet Take 1 tablet (40 mg total) by mouth daily.  . carvedilol (COREG) 3.125 MG tablet Take 3.125 mg by mouth 2 (two) times daily with a meal.  . CINNAMON PO Take 1,000 mg by mouth in the morning and at bedtime.  . clopidogrel (PLAVIX) 75 MG tablet TAKE ONE TABLET EVERY MORNING WITH BREAKFAST  . dapagliflozin propanediol ([name]) 10 MG TABS tablet Take 1 tablet (10 mg total) by mouth daily before breakfast.  . Dulaglutide ([name]) 0.16 WF/0.9NA SOPN Inject 0.75 mg into the skin once a week.  . furosemide (LASIX) 40 MG tablet Take 1 tablet (40 mg total) by mouth daily.  Marland Kitchen gabapentin (NEURONTIN) 300 MG capsule Take 1 capsule (300 mg total) by mouth 3 (three) times daily.  . hydrALAZINE (APRESOLINE) 25 MG tablet Take 1 tablet (25 mg total) by mouth in the morning and at bedtime.  . Multiple Vitamin (MULTIVITAMIN WITH MINERALS) TABS Take 1 tablet by mouth daily. Multivitamin for Men 4+  . nitroGLYCERIN (NITROSTAT) 0.4 MG SL tablet Place 1 tablet (0.4 mg total) under the tongue every 5 (five) minutes as needed for chest pain.  . pantoprazole (PROTONIX) 40 MG tablet TAKE ONE TABLET TWICE DAILY  . sacubitril-valsartan (ENTRESTO) 24-26 MG Take 1 tablet by mouth 2 (two) times daily.     Review of Systems   Review of Systems  Constitutional: Negative for chills, fever and  malaise/fatigue.  Cardiovascular: Positive for dyspnea on exertion. Negative for chest pain, leg swelling, near-syncope, orthopnea, palpitations and syncope.  Respiratory: Negative for cough, shortness of breath and wheezing.   Gastrointestinal: Negative for nausea and vomiting.  Neurological: Negative for dizziness, light-headedness and weakness.   All other systems reviewed and are otherwise negative except as noted above.  Physical Exam    VS:  BP 118/68 (BP Location: Left Arm, Patient Position: Sitting, Cuff Size: Normal)   Pulse 68   Ht 5' 10"  (1.778 m)   Wt 255 lb (115.7 kg)   SpO2 97%   BMI 36.59 kg/m  , BMI Body mass index is 36.59 kg/m. GEN:  Well nourished, overweight,  well developed, in no acute distress. HEENT: normal. Neck: Supple, no JVD, carotid bruits, or masses. Cardiac: RRR, no murmurs, rubs, or gallops. No clubbing, cyanosis, edema.  Radials/DP/PT 2+ and equal bilaterally.  Respiratory: Respirations regular and unlabored, clear to auscultation bilaterally. GI: Soft, nontender, nondistended, BS + x 4. MS: No deformity or atrophy. Skin: Warm and dry, no rash.  Right brachial cath site without hematoma, no evidence of infection nor ecchymosis. Neuro:  Strength and sensation are intact. Psych: Normal affect.   Assessment & Plan   1. CAD - [facility] 08/2019 with RCA mild instent restenosis and moderate left system disease with no obstructive CAD.  He is on Plavix without aspirin due to prior gastric ulcers.  GDMT includes Plavix, Coreg 3.125 mg twice daily, atorvastatin 40 mg daily, PRN nitroglycerin.  Low-sodium, healthy diet encouraged.  Regular cardiovascular exercise encouraged.  2. Chronic systolic heart failure/NICM -Recent admission with LVEF 25 to 30% by echo 07/2019.  Previous LVEF 1 year ago 35 to 40%. NYHA II.  Recent [facility] 08/2019 showing he was euvolemic and no ischemic etiology of cardiomyopathy.  GDMT includes Coreg 2.125 mg twice daily, Entresto 24-26 mg twice  daily, [name] 10 mg daily, Lasix 40 mg daily, Hydralazine 25 mg twice daily.  Defer addition of spironolactone in the setting of kidney disease.  Low-sodium, heart healthy diet encouraged.  He politely declines cardiac rehab referral.  3. CKDIII - Careful titration of diuretics and antihypertensives. His Metformin and Meloxicam have been discontinued.  Renal artery duplex 09/2019 with no evidence of renal stenosis but bilateral resistive kidney index abnormal.  Upcoming follow-up with nephrology.  4. HTN-BP well controlled.  Continue present antihypertensive regimen.  Have encouraged him to check his blood pressure after medications for most accurate assessment.  5. HLD, LDL goal less than 70-03/2019 LDL 44.  Continue present statin.  6. DM2 - Continue to follow with PCP.  10/02/19 A1c 5.9.  Appreciate inclusion of [name].  Disposition: Follow up in December with Dr. Fletcher Anon as previously scheduled  [name], NP 11/09/2019, 4:46 PM

## 2019-11-09 NOTE — Patient Instructions (Addendum)
Medication Instructions:  Your physician has recommended you make the following change in your medication:   Pick up your Lasix 47m tablet at [facility]. You should take one tablet each morning.   *If you need a refill on your cardiac medications before your next appointment, please call your pharmacy*  Lab Work: None today.    Testing/Procedures: None ordered today.   Follow-Up: [facility] and your health needs are our priority.  As part of our continuing mission to provide you with exceptional heart care, we have created designated Provider Care Teams.  These Care Teams include your primary Cardiologist (physician) and Advanced Practice Providers (APPs -  Physician Assistants and Nurse Practitioners) who all work together to provide you with the care you need, when you need it.  We recommend signing up for the patient portal called "MyChart".  Sign up information is provided on this After Visit Summary.  MyChart is used to connect with patients for Virtual Visits (Telemedicine).  Patients are able to view lab/test results, encounter notes, upcoming appointments, etc.  Non-urgent messages can be sent to your provider as well.   To learn more about what you can do with MyChart, go to [email or web address]    Your next appointment:   In December with Dr. AFletcher Anonas previously scheduled  Other Instructions  Call our office if you gain 2 pounds overnight or 5 pounds in one week.   Recommend checking your blood pressure at least 2 hours after your medications.

## 2019-11-16 DIAGNOSIS — I129 Hypertensive chronic kidney disease with stage 1 through stage 4 chronic kidney disease, or unspecified chronic kidney disease: Secondary | ICD-10-CM | POA: Diagnosis not present

## 2019-11-16 DIAGNOSIS — N1832 Chronic kidney disease, stage 3b: Secondary | ICD-10-CM | POA: Diagnosis not present

## 2019-11-16 DIAGNOSIS — D631 Anemia in chronic kidney disease: Secondary | ICD-10-CM | POA: Diagnosis not present

## 2019-11-16 DIAGNOSIS — I5022 Chronic systolic (congestive) heart failure: Secondary | ICD-10-CM | POA: Diagnosis not present

## 2019-11-16 DIAGNOSIS — N189 Chronic kidney disease, unspecified: Secondary | ICD-10-CM | POA: Diagnosis not present

## 2019-11-16 DIAGNOSIS — E1122 Type 2 diabetes mellitus with diabetic chronic kidney disease: Secondary | ICD-10-CM | POA: Diagnosis not present

## 2019-11-22 ENCOUNTER — Other Ambulatory Visit: Payer: Self-pay | Admitting: Family Medicine

## 2019-11-22 DIAGNOSIS — I10 Essential (primary) hypertension: Secondary | ICD-10-CM

## 2019-12-18 ENCOUNTER — Ambulatory Visit: Payer: PPO | Admitting: Pharmacist

## 2019-12-18 VITALS — Wt 243.0 lb

## 2019-12-18 DIAGNOSIS — I5022 Chronic systolic (congestive) heart failure: Secondary | ICD-10-CM

## 2019-12-18 DIAGNOSIS — I25118 Atherosclerotic heart disease of native coronary artery with other forms of angina pectoris: Secondary | ICD-10-CM

## 2019-12-18 DIAGNOSIS — E1142 Type 2 diabetes mellitus with diabetic polyneuropathy: Secondary | ICD-10-CM

## 2019-12-18 DIAGNOSIS — I10 Essential (primary) hypertension: Secondary | ICD-10-CM

## 2019-12-18 MED ORDER — TRULICITY 1.5 MG/0.5ML ~~LOC~~ SOAJ: 1.5000 mg | SUBCUTANEOUS | 3 refills | Status: DC

## 2019-12-18 NOTE — Chronic Care Management (AMB) (Signed)
Chronic Care Management   Follow Up Note   12/18/2019 Name: Devin Hanna MRN: [id number] DOB: 09/01/1950  Referred by: [name], MD Reason for referral : Chronic Care Management (Medication Management)   Devin Hanna is a 69 y.o. year old male who is a primary care patient of [name], Angela Adam, MD. The CCM team was consulted for assistance with chronic disease management and care coordination needs.    Contacted patient for follow up medication management and medication access review.   Review of patient status, including review of consultants reports, relevant laboratory and other test results, and collaboration with appropriate care team members and the patient's provider was performed as part of comprehensive patient evaluation and provision of chronic care management services.    SDOH (Social Determinants of Health) assessments performed: Yes See Care Plan activities for detailed interventions related to SDOH)  SDOH Interventions     Most Recent Value  SDOH Interventions  Financial Strain Interventions Other (Comment)  [manufacturer assistance]       Outpatient Encounter Medications as of 12/18/2019  Medication Sig Note  . atorvastatin (LIPITOR) 40 MG tablet Take 1 tablet (40 mg total) by mouth daily.   . carvedilol (COREG) 6.25 MG tablet Take 6.25 mg by mouth 2 (two) times daily with a meal.  12/18/2019: Taking 1/2 of 12.5 mg BID for 6.25 mg BID  . clopidogrel (PLAVIX) 75 MG tablet TAKE ONE TABLET EVERY MORNING WITH BREAKFAST   . dapagliflozin propanediol ([name]) 10 MG TABS tablet Take 1 tablet (10 mg total) by mouth daily before breakfast.   . furosemide (LASIX) 40 MG tablet Take 1 tablet (40 mg total) by mouth daily.   Marland Kitchen gabapentin (NEURONTIN) 300 MG capsule Take 1 capsule (300 mg total) by mouth 3 (three) times daily.   . hydrALAZINE (APRESOLINE) 25 MG tablet TAKE ONE TABLET EACH MORNING AND AT BEDTIME   . Multiple Vitamin (MULTIVITAMIN WITH MINERALS) TABS  Take 1 tablet by mouth daily. Multivitamin for Men 64+   . pantoprazole (PROTONIX) 40 MG tablet TAKE ONE TABLET TWICE DAILY   . sacubitril-valsartan (ENTRESTO) 24-26 MG Take 1 tablet by mouth 2 (two) times daily.   . [DISCONTINUED] Dulaglutide ([name]) 5.85 ID/7.8EU SOPN Inject 0.75 mg into the skin once a week.   Marland Kitchen CINNAMON PO Take 1,000 mg by mouth in the morning and at bedtime.   . Dulaglutide ([name]) 1.5 MP/5.3IR SOPN Inject 1.5 mg into the skin once a week.   . nitroGLYCERIN (NITROSTAT) 0.4 MG SL tablet Place 1 tablet (0.4 mg total) under the tongue every 5 (five) minutes as needed for chest pain. (Patient not taking: Reported on 12/18/2019)    No facility-administered encounter medications on file as of 12/18/2019.     Objective:   Goals Addressed              This Visit's Progress     Patient Stated   .  [name] "I need help w/ my medications" (pt-stated)        CARE PLAN ENTRY (see longitudinal plan of care for additional care plan information)  Current Barriers:  . Social, financial, community barriers:  o Denies any concerns at this time. Due to reapply for patient assistance  . Diabetes: controlled per A1c, but uncontrolled per recent BG; complicated by chronic medical conditions including HFrEF, CAD, HLD,  most recent A1c 5.9% . Most recent eGFR: ~38 mL/min . Current antihyperglycemic regimen: [name] 10 mg, [name] 4.43 mg weekly o Metformin  recently d/c d/t worsening renal fx and HF  o APPROVED for [name] assistance through [place] for [name] assistance through [place] . Current glucose readings:   Fasting Evening  25-Oct 200 208  26-Oct 202 267  27-Oct 276 244  28-Oct 237 199  29-Oct 190 223  30-Oct 184 233  31-Oct 158 234  1-Nov 184 171  2-Nov 184 257  3-Nov 181 252  4-Nov 163 193  5-Nov 209 266  6-Nov 190 205   197 227   . Cardiovascular risk reduction (follows w/ Dr. Fletcher Anon; most recent EF 25-30%; spironolactone held d/t renal  fx; hx MI w/ RCA stenting) o APPROVED for Entresto assistance through [name].  o Current hypertensive regimen: furosemide 40 mg daily carvedilol 6.25 mg BID (profile said 3.125 mg BID, but patient has been taking 1/2 of 12.5 mg tab BID), Entresto 24/26 mg BID; hydralazine 25 mg BID  AM SBP AM DBP HR PM SBP PM DBP PM HR  25-Oct 113 59 65     26-Oct 109 66 66     27-Oct 114 62 68 114 61 65  28-Oct 129 58 67     29-Oct 107 56 69     30-Oct 111 66 70     31-Oct 109 60 69 151 66 70  1-Nov 134 65 66     2-Nov 140 74 68     3-Nov 128 64 68 131 63 71  4-Nov 135 64 71     5-Nov 142 68 69     6-Nov 122 55 69      124 63 68 132 63 69   o Current hyperlipidemia regimen: atorvastatin 40 mg daily, last LDL very well controlled <55  o Current antiplatelet regimen: clopidogrel 75 mg daily  . GERD: pantoprazole 40 mg BID . Supplements: daily MVI, cinnamon  Pharmacist Clinical Goal(s):  Marland Kitchen Over the next 90 days, patient will work with [name] and primary care provider to address optimized medication management  Interventions: . Comprehensive medication review performed, medication list updated in electronic medical record . Inter-disciplinary care team collaboration (see longitudinal plan of care) . Reviewed medication bottles. Updated profile to reflect that he is taking 6.25 mg BID rather than 3.125 mg BID. BP and HR at goal w/ no evidence of hypotension, so continue current regimen w/ 6.25 mg BID. Will update cardiology team.  . Glucose readings not at goal. Increase [name] to 1.5 mg weekly. Patient will do 2 injections of current supply of [name] 0.07 mg. Phoned updated script to [employer].  . Due to reapply for patient assistance for 2022 for [name], and [name]. Will collaborate w/ CPhT to mail patient portion of application for [name] and Entresto to him. [place] reapplication for Wilder Glade can be done online; patient declines to do together today, he would rather let  his daughter, [name], complete this. Will mail instructions to [place]. Will collaborate w/ CPhT on submission and follow up for [name] and Entresto assistance.    Patient Self Care Activities:  . Patient will check blood glucose BID, document, and provide at future appointments . Patient will check pressure daily to BID, document, and provide at future appointments . Patient will take medications as prescribed . Patient will report any questions or concerns to provider   Please see past updates related to this goal by clicking on the "Past Updates" button in the selected goal          Plan:  -  Scheduled f/u call in ~ 6 weeks  [name], [name], [place], [place] Pharmacist [place] [phone]

## 2019-12-18 NOTE — Patient Instructions (Signed)
[name] and Devin Hanna,   It was great talking to you today!  We increased [name] to 1.5 mg weekly. You can take 2 of the 0.75 mg shots (one after the other) weekly to finish your current supply. I called an updated script into [employer] today.   There should be a phone number for the specific program pharmacy on the bottles for the [place]. Call those numbers to request refills.   To reapply for assistance for 2022:  - [name] - Devin Hanna, you can go to the [employer] to reapply electronically. See the enclosed screenshot for where to go.  - [name] and [name] - my pharmacy technician will mail the parts of the two applications to you to complete, along with what else is required for the applications. Once completed, you can either mail these back to [place] with the enclosed envelope OR drop off here at Devin Hanna office for me. We will work on getting Dr. [name] to fill out the prescriber portion for the [name] application and Dr. Fletcher Anon to fill out the prescriber portion for the [facility].   I've updated your medication list to reflect that you are taking carvedilol 6.25 mg twice daily (half of a 12.5 mg tablet), instead of the 3.125 mg strength we had previously listed. Continue taking 6.25 mg twice daily as you are currently doing, as your blood pressure and heart rate are well controlled.   As always, call me with any questions or concerns!  [name], [name] [phone]  Visit Information  Goals Addressed              This Visit's Progress     Patient Stated     [name] "I need help w/ my medications" (pt-stated)        CARE PLAN ENTRY (see longitudinal plan of care for additional care plan information)  Current Barriers:   Social, financial, community barriers:  o Denies any concerns at this time. Due to reapply for patient assistance   Diabetes: controlled per A1c, but uncontrolled per recent BG; complicated by chronic medical  conditions including HFrEF, CAD, HLD,  most recent A1c 5.9%  Most recent eGFR: ~38 mL/min  Current antihyperglycemic regimen: [name] 10 mg, [name] 1.74 mg weekly o Metformin recently d/c d/t worsening renal fx and HF  o APPROVED for [name] assistance through [place] for [name] assistance through [place]  Current glucose readings:   Fasting Evening  25-Oct 200 208  26-Oct 202 267  27-Oct 276 244  28-Oct 237 199  29-Oct 190 223  30-Oct 184 233  31-Oct 158 234  1-Nov 184 171  2-Nov 184 257  3-Nov 181 252  4-Nov 163 193  5-Nov 209 266  6-Nov 190 205   197 227    Cardiovascular risk reduction (follows w/ Dr. Fletcher Anon; most recent EF 25-30%; spironolactone held d/t renal fx; hx MI w/ RCA stenting) o APPROVED for Entresto assistance through [name].  o Current hypertensive regimen: furosemide 40 mg daily carvedilol 6.25 mg BID (profile said 3.125 mg BID, but patient has been taking 1/2 of 12.5 mg tab BID), Entresto 24/26 mg BID; hydralazine 25 mg BID  AM SBP AM DBP HR PM SBP PM DBP PM HR  25-Oct 113 59 65     26-Oct 109 66 66     27-Oct 114 62 68 114 61 65  28-Oct 129 58 67     29-Oct 107 56 69  30-Oct 111 66 70     31-Oct 109 60 69 151 66 70  1-Nov 134 65 66     2-Nov 140 74 68     3-Nov 128 64 68 131 63 71  4-Nov 135 64 71     5-Nov 142 68 69     6-Nov 122 55 69      124 63 68 132 63 69   o Current hyperlipidemia regimen: atorvastatin 40 mg daily, last LDL very well controlled <55  o Current antiplatelet regimen: clopidogrel 75 mg daily   GERD: pantoprazole 40 mg BID  Supplements: daily MVI, cinnamon  Pharmacist Clinical Goal(s):   Over the next 90 days, patient will work with [name] and primary care provider to address optimized medication management  Interventions:  Comprehensive medication review performed, medication list updated in electronic medical record  Inter-disciplinary care team collaboration (see longitudinal plan of  care)  Reviewed medication bottles. Updated profile to reflect that he is taking 6.25 mg BID rather than 3.125 mg BID. BP and HR at goal w/ no evidence of hypotension, so continue current regimen w/ 6.25 mg BID. Will update cardiology team.   Glucose readings not at goal. Increase [name] to 1.5 mg weekly. Patient will do 2 injections of current supply of [name] 7.93 mg. Phoned updated script to [employer].   Due to reapply for patient assistance for 2022 for [name], and [name]. Will collaborate w/ CPhT to mail patient portion of application for [name] and Entresto to him. [place] reapplication for [place] Glade can be done online; patient declines to do together today, he would rather let his daughter, Devin Hanna, complete this. Will mail instructions to [place]. Will collaborate w/ CPhT on submission and follow up for [name] and Entresto assistance.    Patient Self Care Activities:   Patient will check blood glucose BID, document, and provide at future appointments  Patient will check pressure daily to BID, document, and provide at future appointments  Patient will take medications as prescribed  Patient will report any questions or concerns to provider   Please see past updates related to this goal by clicking on the "Past Updates" button in the selected goal         The patient verbalized understanding of instructions provided today and agreed to receive a mailed copy of patient instruction and/or educational materials.   Plan:  - Scheduled f/u call in ~ 6 weeks  [name], [name], [place], [place] Pharmacist [place] [phone]

## 2020-01-10 ENCOUNTER — Ambulatory Visit: Payer: PPO | Admitting: Family Medicine

## 2020-01-11 ENCOUNTER — Other Ambulatory Visit: Payer: Self-pay

## 2020-01-11 ENCOUNTER — Encounter: Payer: Self-pay | Admitting: Family Medicine

## 2020-01-11 ENCOUNTER — Ambulatory Visit (INDEPENDENT_AMBULATORY_CARE_PROVIDER_SITE_OTHER): Payer: PPO | Admitting: Family Medicine

## 2020-01-11 ENCOUNTER — Ambulatory Visit: Payer: PPO | Admitting: Pharmacist

## 2020-01-11 VITALS — BP 130/70 | HR 70 | Temp 98.2°F | Ht 70.0 in | Wt 246.8 lb

## 2020-01-11 DIAGNOSIS — G8929 Other chronic pain: Secondary | ICD-10-CM | POA: Diagnosis not present

## 2020-01-11 DIAGNOSIS — M25562 Pain in left knee: Secondary | ICD-10-CM | POA: Insufficient documentation

## 2020-01-11 DIAGNOSIS — I25118 Atherosclerotic heart disease of native coronary artery with other forms of angina pectoris: Secondary | ICD-10-CM

## 2020-01-11 DIAGNOSIS — Z23 Encounter for immunization: Secondary | ICD-10-CM

## 2020-01-11 DIAGNOSIS — E1142 Type 2 diabetes mellitus with diabetic polyneuropathy: Secondary | ICD-10-CM | POA: Diagnosis not present

## 2020-01-11 DIAGNOSIS — R059 Cough, unspecified: Secondary | ICD-10-CM

## 2020-01-11 DIAGNOSIS — Z8549 Personal history of malignant neoplasm of other male genital organs: Secondary | ICD-10-CM

## 2020-01-11 DIAGNOSIS — I10 Essential (primary) hypertension: Secondary | ICD-10-CM | POA: Diagnosis not present

## 2020-01-11 DIAGNOSIS — I5022 Chronic systolic (congestive) heart failure: Secondary | ICD-10-CM | POA: Diagnosis not present

## 2020-01-11 NOTE — Assessment & Plan Note (Signed)
Glucoses have been elevated.  Clinical pharmacist spoke with patient today.  For now we will have him continue with [name] 1.5 mg once weekly.  He will also continue farxiga 10 mg once daily.  Our clinical pharmacist will follow up with him in 2 weeks to discuss increasing the [name] to 3 mg once weekly.

## 2020-01-11 NOTE — Patient Instructions (Signed)
Nice to see you. We will contact you with your COVID-19 test result. You need to quarantine at home at least until we get negative test results.  This means you should not have any visitors and should not leave the house unless she have to seek medical attention. If your symptoms worsen please let us know immediately. You need to avoid Tylenol and anti-inflammatory such as ibuprofen or Aleve.  You can use topical capsaicin on your left knee if needed.

## 2020-01-11 NOTE — Assessment & Plan Note (Signed)
Patient with BMI greater than 35 with multiple comorbidities.  Weight has been trending down with decreased appetite from the [name] and smaller portion sizes.  We will continue to monitor this moving forward.  Encouraged activity as he is able to.

## 2020-01-11 NOTE — Assessment & Plan Note (Addendum)
The patient noted these symptoms at the end of the visit while completing his lung exam.  He notes they did ask him the questions at the front though he answered no to them as he did not have the symptoms today.  I discussed given our current protocol that recent symptoms would have excluded him from coming into the office.  Discussed getting him tested for Covid.  Advised that I could not complete any further exam at this time given our current Covid protocols.  Covid test ordered.  Patient advised to quarantine until we get a negative test result.  Symptoms could be related to a cold virus or COVID-19 or allergies.

## 2020-01-11 NOTE — Assessment & Plan Note (Signed)
Patient notes no concerning symptoms.  The plan for today was to complete an exam though given his symptoms of cough and runny nose and the fact that I did not pass for testing for an N95 mask we had to cut the exam short.  We will plan on doing a follow-up exam at his next visit.

## 2020-01-11 NOTE — Patient Instructions (Signed)
Visit Information Patient Care Plan: Medication Management    Problem Identified: Diabetes, CHF, HTN     Long-Range Goal: Disease Progression Prevention   This Visit's Progress: On track  Priority: High  Note:   Current Barriers:  . Unable to independently afford treatment regimen . Unable to achieve control of diabetes  . Complex patient with multiple comorbidities including HFrEF, CAD, HLD  Pharmacist Clinical Goal(s):  Marland Kitchen Over the next 90 days, patient will verbalize ability to afford treatment regimen. . Over the next 90 days, patient will achieve adherence to monitoring guidelines and medication adherence to achieve therapeutic efficacy. . Over the next 90 days, patient will achieve control of diabetes as evidenced by improvement in A1c  Interventions: . Inter-disciplinary care team collaboration (see longitudinal plan of care) . Comprehensive medication review performed; medication list updated in electronic medical record  Diabetes: . Uncontrolled per SMBG reports; current treatment: [name] 1.5 mg weekly (using 2 0.75 mg pens weekly, has been doing this for about 2-3 weeks) [name] 10 mg daily o Metformin d/c d/t renal function  . Current glucose readings: did not bring readings with him today, but indicates readings 150-200s fastings; reports symptomatic hypoglycemia when readings <140 . Likely will need further dose increase of [name]. Continue 1.5 mg weekly until our follow up phone call later this month. Will determine at that point if titration to 3 mg weekly needed . Patient notes that his daughter mailed applications for [place] back to CPhT ~1-2 weeks ago. Will continue to collaborate w/ CPhT towards medication access  HFrEF, (most recent EF 25-30%) . Appropriately managed; current treatment: furosemide 40 mg daily carvedilol 6.25 mg BID, Entresto 24/26 mg BID; hydralazine 25 mg BID, follows w/ Dr. Fletcher Anon o Spironolactone held d/t renal  fx; Marland Kitchen Recommended to continue current regimen w/ collaboration w/ cardiology . Patient notes that his daughter mailed applications for [employer] back to CPhT ~1-2 weeks ago. Will continue to collaborate w/ CPhT towards medication access  Hyperlipidemia, secondary ASCVD prevention (hx MI w/ RCA stenting) . Controlled; current treatment: atorvastatin 40 mg daily . Antiplatelet regimen: clopidogrel 75 mg daily . Recommended to continue current regimen at this time  GERD: . Appropriately managed; current treatment: pantoprazole 40 mg BID . Continue current regimen  Patient Goals/Self-Care Activities . Over the next 90 days, patient will:  - take medications as prescribed check blood glucose BID, document, and provide at future appointments check blood pressure BID, document, and provide at future appointments collaborate with provider on medication access solutions  Follow Up Plan: Telephone follow up appointment with care management team member scheduled for: ~  2 weeks as previously scheduled       The patient verbalized understanding of instructions, educational materials, and care plan provided today and declined offer to receive copy of patient instructions, educational materials, and care plan.   Plan: Telephone follow up appointment with care management team member scheduled for: ~ 2 weeks as previously scheduled  [name], [name], [place], [place] Pharmacist [place] [phone]

## 2020-01-11 NOTE — Assessment & Plan Note (Signed)
Given patient's kidney function and liver issues he cannot use NSAIDs or Tylenol.  We will have him try topical capsaicin.

## 2020-01-11 NOTE — Assessment & Plan Note (Signed)
Decent control.  He will continue his medications per above.

## 2020-01-11 NOTE — Chronic Care Management (AMB) (Signed)
Chronic Care Management   Pharmacy Note  01/11/2020 Name: Devin Hanna MRN: [id number] DOB: 02/06/1951   Subjective:  Devin Hanna is a 69 y.o. year old male who is a primary care patient of [name], Angela Adam, MD. The CCM team was consulted for assistance with chronic disease management and care coordination needs.    Engaged with patient face to face for follow up visit in response to provider referral for pharmacy case management and/or care coordination services.   Consent to Services:  [name] was given information about Chronic Care Management services, agreed to services, and gave verbal consent prior to initiation of services on 09/04/2019. Please see initial visit note for detailed documentation.   SDOH (Social Determinants of Health) assessments and interventions performed:  SDOH Interventions     Most Recent Value  SDOH Interventions  Financial Strain Interventions Other (Comment)  [manufacturer assistance]       Objective:  Lab Results  Component Value Date   CREATININE 1.83 (H) 09/29/2019   CREATININE 1.95 (H) 09/22/2019   CREATININE 1.74 (H) 09/07/2019    Lab Results  Component Value Date   HGBA1C 5.9 (A) 10/10/2019       Component Value Date/Time   CHOL 130 04/03/2019 0837   CHOL 103 06/07/2014 0824   TRIG 199.0 (H) 04/03/2019 0837   HDL 45.90 04/03/2019 0837   HDL 38 (L) 06/07/2014 0824   CHOLHDL 3 04/03/2019 0837   VLDL 39.8 04/03/2019 0837   LDLCALC 44 04/03/2019 0837   LDLCALC 59 03/30/2018 0930     BP Readings from Last 3 Encounters:  01/11/20 130/70  11/09/19 118/68  10/25/19 140/70    Assessment/Interventions: Review of patient past medical history, allergies, medications, health status, including review of consultants reports, laboratory and other test data, was performed as part of comprehensive evaluation and provision of chronic care management services.   No Known Allergies  Medications Reviewed Today    Reviewed by  Gordy Councilman, CMA (Certified Medical Assistant) on 01/11/20 at [phone]  Med List Status: <None>  Medication Order Taking? Sig Documenting Provider Last Dose Status Informant  atorvastatin (LIPITOR) 40 MG tablet [id number] Yes Take 1 tablet (40 mg total) by mouth daily. Theora Gianotti, NP Taking Active   carvedilol (COREG) 6.25 MG tablet [id number] Yes Take 6.25 mg by mouth 2 (two) times daily with a meal.  [provider] Taking Active            Med Note Darnelle Maffucci, Waynette Buttery Dec 18, 2019 10:27 AM) Taking 1/2 of 12.5 mg BID for 6.25 mg BID  CINNAMON PO [id number] Yes Take 1,000 mg by mouth in the morning and at bedtime. [provider] Taking Active Family Member  clopidogrel (PLAVIX) 75 MG tablet [id number] Yes TAKE ONE TABLET EVERY MORNING WITH BREAKFAST [name], NP Taking Active   dapagliflozin propanediol ([name]) 10 MG TABS tablet [id number] Yes Take 1 tablet (10 mg total) by mouth daily before breakfast. [name], MD Taking Active   Dulaglutide ([name]) 1.5 OY/7.7[facility] [id number] Yes Inject 1.5 mg into the skin once a week. [name], MD Taking Active   furosemide (LASIX) 40 MG tablet [id number] Yes Take 1 tablet (40 mg total) by mouth daily. [name], NP Taking Active   gabapentin (NEURONTIN) 300 MG capsule [id number] Yes Take 1 capsule (300 mg total) by mouth 3 (three) times daily. [name], MD Taking Active   hydrALAZINE (APRESOLINE)  25 MG tablet [id number] Yes TAKE ONE TABLET EACH MORNING AND AT BEDTIME [name], MD Taking Active   Multiple Vitamin (MULTIVITAMIN WITH MINERALS) TABS [id number] Yes Take 1 tablet by mouth daily. Multivitamin for Men 50+ [provider] Taking Active Family Member  nitroGLYCERIN (NITROSTAT) 0.4 MG SL tablet [id number] Yes Place 1 tablet (0.4 mg total) under the tongue every 5 (five) minutes as needed for chest pain. [name], MD Taking Active Family Member    pantoprazole (PROTONIX) 40 MG tablet [id number] Yes TAKE ONE TABLET TWICE DAILY [name], NP Taking Active   sacubitril-valsartan (ENTRESTO) 24-26 MG [id number] Yes Take 1 tablet by mouth 2 (two) times daily. [name], NP Taking Active Family Member          Patient Active Problem List   Diagnosis Date Noted  . Abnormal cardiovascular stress test   . Acute on chronic systolic heart failure ([place]) 07/30/2019  . AK (actinic keratosis) 04/03/2019  . Colon cancer screening   . Rectal polyp   . Polyp of transverse colon   . Liver lesion 12/06/2018  . Chronic kidney disease (CKD), stage III (moderate) ([place]) 12/06/2018  . Abrasion 12/06/2018  . History of anemia 03/30/2018  . Hyperpigmented skin lesion 12/20/2017  . Bilateral foot pain 12/20/2017  . DDD (degenerative disc disease), lumbar 08/18/2017  . SCC (squamous cell carcinoma), trunk 05/17/2017  . Venous insufficiency 02/12/2017  . Decreased pedal pulses 02/12/2017  . Skin lesion 02/12/2017  . Neuropathy 01/09/2016  . Elevated LFTs 06/28/2015  . 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 Peptic ulcer disease 09/12/2014  . Erectile dysfunction 09/07/2013  . Chronic systolic heart failure ([place]) 05/15/2013  . Hyperlipidemia 05/15/2013  . DM type 2 (diabetes mellitus, type 2) ([place]) 04/07/2013  . Other and unspecified hyperlipidemia 04/07/2013  . Morbid obesity ([place]) 04/07/2013  . HTN (hypertension) 04/07/2013  . CAD (coronary artery disease) 04/04/2013    Medication Assistance: Application for [name], [name], and Entresto medication assistance program in process. Anticipated assistance start date TBD. See plan of care below for additional detail.   Patient Care Plan: Medication Management    Problem Identified: Diabetes, CHF, HTN     Long-Range Goal: Disease Progression Prevention   This Visit's Progress: On track  Priority: High  Note:   Current Barriers:  . Unable to independently afford treatment  regimen . Unable to achieve control of diabetes  . Complex patient with multiple comorbidities including HFrEF, CAD, HLD  Pharmacist Clinical Goal(s):  Marland Kitchen Over the next 90 days, patient will verbalize ability to afford treatment regimen. . Over the next 90 days, patient will achieve adherence to monitoring guidelines and medication adherence to achieve therapeutic efficacy. . Over the next 90 days, patient will achieve control of diabetes as evidenced by improvement in A1c  Interventions: . Inter-disciplinary care team collaboration (see longitudinal plan of care) . Comprehensive medication review performed; medication list updated in electronic medical record  Diabetes: . Uncontrolled per SMBG reports; current treatment: [name] 1.5 mg weekly (using 2 0.75 mg pens weekly, has been doing this for about 2-3 weeks) [name] 10 mg daily o Metformin d/c d/t renal function  . Current glucose readings: did not bring readings with him today, but indicates readings 150-200s fastings; reports symptomatic hypoglycemia when readings <140 . Likely will need further dose increase of [name]. Continue 1.5 mg weekly until our follow up phone call later this month. Will determine at that point if titration to 3 mg weekly  needed . Patient notes that his daughter mailed applications for [place] back to CPhT ~1-2 weeks ago. Will continue to collaborate w/ CPhT towards medication access  HFrEF, (most recent EF 25-30%) . Appropriately managed; current treatment: furosemide 40 mg daily carvedilol 6.25 mg BID, Entresto 24/26 mg BID; hydralazine 25 mg BID, follows w/ Dr. Fletcher Anon o Spironolactone held d/t renal fx; Marland Kitchen Recommended to continue current regimen w/ collaboration w/ cardiology . Patient notes that his daughter mailed applications for [employer] back to CPhT ~1-2 weeks ago. Will continue to collaborate w/ CPhT towards medication access  Hyperlipidemia, secondary ASCVD prevention (hx MI w/ RCA  stenting) . Controlled; current treatment: atorvastatin 40 mg daily . Antiplatelet regimen: clopidogrel 75 mg daily . Recommended to continue current regimen at this time  GERD: . Appropriately managed; current treatment: pantoprazole 40 mg BID . Continue current regimen  Patient Goals/Self-Care Activities . Over the next 90 days, patient will:  - take medications as prescribed check blood glucose BID, document, and provide at future appointments check blood pressure BID, document, and provide at future appointments collaborate with provider on medication access solutions  Follow Up Plan: Telephone follow up appointment with care management team member scheduled for: ~  2 weeks as previously scheduled       Plan: Telephone follow up appointment with care management team member scheduled for: ~ 2 weeks as previously scheduled  [name], [name], [place], [place] Pharmacist [place] [phone]

## 2020-01-11 NOTE — Progress Notes (Signed)
Devin Rumps, MD Phone: [phone]  Devin Hanna is a 69 y.o. male who presents today for f/u.  DIABETES Disease Monitoring: Blood Sugar ranges-170-200 Polyuria/phagia/dipsia- no     Medications: Compliance- taking trulicity, farxiga Hypoglycemic symptoms- no  HYPERTENSION/CHF Disease Monitoring  Home BP Monitoring 120-130/65-70s    Chest pain- no    Dyspnea- no Medications  Compliance-  Taking Entresto, farxiga, Lasix, Coreg.  Edema-no Denies orthopnea and PND  Left knee pain: This is a chronic issue.  Is typically always present.  Symptoms are worse than others.  Notes he had an injury many years ago and has had discomfort since then.  He cannot take anti-inflammatories due to his kidney function and cannot take Aleve due to cirrhosis.  Obesity: Patient's weight has trended down.  His appetite has decreased since he has been on [name].  He is eating smaller portions.  He tries to stay active around the house and walk some for exercise.  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  Everything seems stable from his perspective.  He declined an exam at his last visit.  Cough: Patient notes this started 2 days ago with runny nose.  No fever.  He has not had any COVID-19 exposures.  He is not vaccinated against COVID-19.  He feels as though it is a regular cold and notes he typically recovers quite quickly from colds. No symptoms today.    Social History   Tobacco Use  Smoking Status Former Smoker  . Packs/day: 3.00  . Years: 40.00  . Pack years: 120.00  . Types: Cigarettes  . Quit date: 04/05/1995  . Years since quitting: 24.7  Smokeless Tobacco Former Systems developer  Tobacco Comment   started smoking at age 70     ROS see history of present illness  Objective  Physical Exam Vitals:   01/11/20 0822  BP: 130/70  Pulse: 70  Temp: 98.2 F (36.8 C)  SpO2: 97%    BP Readings from Last 3 Encounters:  01/11/20 130/70  11/09/19 118/68    10/25/19 140/70   Wt Readings from Last 3 Encounters:  01/11/20 246 lb 12.8 oz (111.9 kg)  12/18/19 243 lb (110.2 kg)  11/09/19 255 lb (115.7 kg)    Physical Exam Constitutional:      General: He is not in acute distress.    Appearance: He is not diaphoretic.  Cardiovascular:     Rate and Rhythm: Normal rate and regular rhythm.     Heart sounds: Normal heart sounds.  Pulmonary:     Effort: Pulmonary effort is normal.     Breath sounds: Normal breath sounds.  Skin:    General: Skin is warm and dry.  Neurological:     Mental Status: He is alert.      Assessment/Plan: Please see individual problem list.  Problem List Items Addressed This Visit    Chronic systolic heart failure ([place])    Asymptomatic.  He will continue carvedilol 6.25 mg twice daily, farxiga 10 mg daily, hydralazine 25 mg twice daily, and Entresto 24-26 mg twice daily.      Cough - Primary    The patient noted these symptoms at the end of the visit while completing his lung exam.  He notes they did ask him the questions at the front though he answered no to them as he did not have the symptoms today.  I discussed given our current protocol that recent symptoms would have excluded him from coming into  the office.  Discussed getting him tested for Covid.  Advised that I could not complete any further exam at this time given our current Covid protocols.  Covid test ordered.  Patient advised to quarantine until we get a negative test result.  Symptoms could be related to a cold virus or COVID-19 or allergies.      Relevant Orders   Novel Coronavirus, NAA ([facility])   DM type 2 (diabetes mellitus, type 2) ([place])    Glucoses have been elevated.  Clinical pharmacist spoke with patient today.  For now we will have him continue with [name] 1.5 mg once weekly.  He will also continue farxiga 10 mg once daily.  Our clinical pharmacist will follow up with him in 2 weeks to discuss increasing the [name] to 3 mg once  weekly.      History of penile cancer    Patient notes no concerning symptoms.  The plan for today was to complete an exam though given his symptoms of cough and runny nose and the fact that I did not pass for testing for an N95 mask we had to cut the exam short.  We will plan on doing a follow-up exam at his next visit.      HTN (hypertension)    Decent control.  He will continue his medications per above.      Left knee pain    Given patient's kidney function and liver issues he cannot use NSAIDs or Tylenol.  We will have him try topical capsaicin.      Morbid obesity ([place])    Patient with BMI greater than 35 with multiple comorbidities.  Weight has been trending down with decreased appetite from the [name] and smaller portion sizes.  We will continue to monitor this moving forward.  Encouraged activity as he is able to.       Other Visit Diagnoses    Need for immunization against influenza       Relevant Orders   Flu Vaccine QUAD High Dose(Fluad) (Completed)      This visit occurred during the SARS-CoV-2 public health emergency.  Safety protocols were in place, including screening questions prior to the visit, additional usage of staff PPE, and extensive cleaning of exam room while observing appropriate contact time as indicated for disinfecting solutions.    Devin Rumps, MD [place]

## 2020-01-11 NOTE — Assessment & Plan Note (Signed)
Asymptomatic.  He will continue carvedilol 6.25 mg twice daily, farxiga 10 mg daily, hydralazine 25 mg twice daily, and Entresto 24-26 mg twice daily.

## 2020-01-12 LAB — SARS-COV-2, NAA 2 DAY TAT

## 2020-01-12 LAB — NOVEL CORONAVIRUS, NAA: SARS-CoV-2, NAA: NOT DETECTED

## 2020-01-16 ENCOUNTER — Telehealth: Payer: Self-pay | Admitting: Family Medicine

## 2020-01-16 NOTE — Telephone Encounter (Signed)
Patients daughter wanted the patient's covid results and I informed her that it was negative.  Juris Gosnell,cma

## 2020-01-16 NOTE — Telephone Encounter (Signed)
Pt daughter called returning a call

## 2020-01-16 NOTE — Telephone Encounter (Signed)
Patient's daughter called in wanted know if someone would call and explain the covid test results her father had.

## 2020-01-18 ENCOUNTER — Ambulatory Visit: Payer: PPO | Admitting: Cardiovascular Disease

## 2020-01-18 ENCOUNTER — Encounter: Payer: Self-pay | Admitting: Cardiovascular Disease

## 2020-01-18 ENCOUNTER — Other Ambulatory Visit: Payer: Self-pay | Admitting: Family

## 2020-01-18 ENCOUNTER — Other Ambulatory Visit: Payer: Self-pay

## 2020-01-18 VITALS — BP 120/60 | HR 73 | Ht 70.0 in | Wt 244.5 lb

## 2020-01-18 DIAGNOSIS — N182 Chronic kidney disease, stage 2 (mild): Secondary | ICD-10-CM | POA: Diagnosis not present

## 2020-01-18 DIAGNOSIS — E785 Hyperlipidemia, unspecified: Secondary | ICD-10-CM

## 2020-01-18 DIAGNOSIS — I5022 Chronic systolic (congestive) heart failure: Secondary | ICD-10-CM | POA: Diagnosis not present

## 2020-01-18 DIAGNOSIS — I2510 Atherosclerotic heart disease of native coronary artery without angina pectoris: Secondary | ICD-10-CM

## 2020-01-18 DIAGNOSIS — E1122 Type 2 diabetes mellitus with diabetic chronic kidney disease: Secondary | ICD-10-CM | POA: Diagnosis not present

## 2020-01-18 DIAGNOSIS — I10 Essential (primary) hypertension: Secondary | ICD-10-CM

## 2020-01-18 NOTE — Progress Notes (Signed)
Cardiology Office Note   Date:  01/18/2020   ID:  Devin Hanna, DOB [birth year], MRN [id number]  PCP:  [name], MD  Cardiologist:   [name], MD   Chief Complaint  Patient presents with  . Other    6 month f/u no complaints today. Meds reviewed verbally with pt.      History of Present Illness: Devin Hanna is a 69 y.o. male who presents for a followup visit regarding coronary artery disease and chronic systolic heart failure.  He has known CAD with past RCA stents and prior MI, chronic systolic heart failure, peripheral neuropathy, left bundle branch block, prior GI bleed GERD, DM, HTN and HLD.  He was hospitalized in June of this year with acute on chronic systolic heart failure with acute kidney injury.  Echocardiogram showed an EF of 25 to 30%.  He underwent a right and left cardiac catheterization in July which showed patent RCA stent with mild in-stent restenosis and moderate disease affecting the left system with moderate calcifications.  Right heart catheterization showed mildly elevated filling pressures and mild pulmonary hypertension with normal cardiac output.  Outpatient renal artery duplex showed no evidence of renal artery stenosis.  He was started on [name]. Hydralazine was added for blood pressure control.  He has been doing relatively well with stable exertional dyspnea which only happens with overexertion.  No chest pain or leg edema.  He is more active and has been able to lose about 25 to 30 pounds over the last year.   Past Medical History:  Diagnosis Date  . Acute respiratory failure with hypoxia ([place]) 06/2016  . CAD (coronary artery disease)    a. Remote PCI of the RCA;  b. 03/2013 Inf STEMI/PCI: LAD 60-70d, RCA 99p ISR(rota/CBA/DES);  c. 09/2014 MV: inf and infsept infarct, EF 35-40%;  d. 09/2014 Cath: patent RCA stent, stable LAD dzs, EF 40%-->Med rx.  . CHF (congestive heart failure) ([place])   . Diabetes mellitus    sees Dr. Hardin Negus in  [place]  . GERD (gastroesophageal reflux disease)   . HFrEF (heart failure with reduced ejection fraction) ([place])   . Hyperlipidemia   . Hypertension   . Ischemic cardiomyopathy    a. 09/2014 EF 40% by V gram;  b. 06/2016 Echo: EF 40, diff HK, Gr1 DD, inf HK, no mural thrombus. Mild MR, mildly dil LA, mildly dil RV.   . LBBB (left bundle branch block)    Old  . Liver mass   . Morbid obesity ([place])   . Peripheral neuropathy   . Upper GI bleed   . Vertigo     Past Surgical History:  Procedure Laterality Date  . CARDIAC CATHETERIZATION N/A 10/03/2014   Procedure: Left Heart Cath and Coronary Angiography;  Surgeon: [name], MD;  Location: [facility];  Service: Cardiovascular;  Laterality: N/A;  . CIRCUMCISION N/A 07/22/2015   Procedure: CIRCUMCISION ADULT;  Surgeon: Hollice Espy, MD;  Location: [facility] ORS;  Service: Urology;  Laterality: N/A;  . COLONOSCOPY WITH PROPOFOL N/A 01/12/2019   Procedure: COLONOSCOPY WITH PROPOFOL;  Surgeon: Lucilla Lame, MD;  Location: [facility] ENDOSCOPY;  Service: Endoscopy;  Laterality: N/A;  . CORONARY STENT PLACEMENT    . ESOPHAGOGASTRODUODENOSCOPY (EGD) WITH PROPOFOL N/A 09/12/2014   Procedure: ESOPHAGOGASTRODUODENOSCOPY (EGD) WITH PROPOFOL;  Surgeon: Ronald Lobo, MD;  Location: [facility] ENDOSCOPY;  Service: Endoscopy;  Laterality: N/A;  . EXTERNAL FIXATION LEG Left 04/03/2012   Procedure: EXTERNAL FIXATION LEG;  Surgeon: Wylene Simmer, MD;  Location: [place];  Service: Orthopedics;  Laterality: Left;  . EXTERNAL FIXATION REMOVAL Left 04/14/2012   Procedure: REMOVAL EXTERNAL FIXATION LEG;  Surgeon: Wylene Simmer, MD;  Location: [place];  Service: Orthopedics;  Laterality: Left;  . IRRIGATION AND DEBRIDEMENT KNEE Left 04/03/2012   Procedure: IRRIGATION AND DEBRIDEMENT KNEE;  Surgeon: Wylene Simmer, MD;  Location: [facility];  Service: Orthopedics;  Laterality: Left;  . LEFT HEART CATHETERIZATION WITH CORONARY ANGIOGRAM N/A 04/05/2013   Procedure: LEFT HEART  CATHETERIZATION WITH CORONARY ANGIOGRAM;  Surgeon: [name], MD;  Location: [facility] CATH LAB;  Service: Cardiovascular;  Laterality: N/A;  . MASS EXCISION N/A 07/22/2015   Procedure: EXCISION PENILE MASS;  Surgeon: Hollice Espy, MD;  Location: [facility] ORS;  Service: Urology;  Laterality: N/A;  . ORIF TIBIA PLATEAU Left 04/14/2012   Procedure: OPEN REDUCTION INTERNAL FIXATION (ORIF) TIBIAL PLATEAU;  Surgeon: Wylene Simmer, MD;  Location: [facility];  Service: Orthopedics;  Laterality: Left;  . PERCUTANEOUS CORONARY ROTOBLATOR INTERVENTION (PCI-R) N/A 04/06/2013   Procedure: PERCUTANEOUS CORONARY ROTOBLATOR INTERVENTION (PCI-R);  Surgeon: [name], MD;  Location: [facility] CATH LAB;  Service: Cardiovascular;  Laterality: N/A;  . RIGHT/LEFT HEART CATH AND CORONARY ANGIOGRAPHY N/A 08/21/2019   Procedure: RIGHT/LEFT HEART CATH AND CORONARY ANGIOGRAPHY;  Surgeon: [name], MD;  Location: [facility] CV LAB;  Service: Cardiovascular;  Laterality: N/A;     Current Outpatient Medications  Medication Sig Dispense Refill  . atorvastatin (LIPITOR) 40 MG tablet Take 1 tablet (40 mg total) by mouth daily. 90 tablet 0  . carvedilol (COREG) 6.25 MG tablet Take 6.25 mg by mouth 2 (two) times daily with a meal.     . CINNAMON PO Take 1,000 mg by mouth in the morning and at bedtime.    . clopidogrel (PLAVIX) 75 MG tablet TAKE ONE TABLET EVERY MORNING WITH BREAKFAST 90 tablet 0  . dapagliflozin propanediol ([name]) 10 MG TABS tablet Take 1 tablet (10 mg total) by mouth daily before breakfast. 30 tablet 3  . Dulaglutide ([name]) 1.5 OR/5.6FB SOPN Inject 1.5 mg into the skin once a week. 6 mL 3  . ferrous sulfate 325 (65 FE) MG tablet Take 325 mg by mouth daily with breakfast.    . furosemide (LASIX) 40 MG tablet Take 1 tablet (40 mg total) by mouth daily. 90 tablet 3  . gabapentin (NEURONTIN) 300 MG capsule Take 1 capsule (300 mg total) by mouth 3 (three) times daily. 90 capsule 3  . hydrALAZINE (APRESOLINE)  25 MG tablet TAKE ONE TABLET EACH MORNING AND AT BEDTIME 60 tablet 2  . Multiple Vitamin (MULTIVITAMIN WITH MINERALS) TABS Take 1 tablet by mouth daily. Multivitamin for Men 73+    . nitroGLYCERIN (NITROSTAT) 0.4 MG SL tablet Place 1 tablet (0.4 mg total) under the tongue every 5 (five) minutes as needed for chest pain. 25 tablet 3  . pantoprazole (PROTONIX) 40 MG tablet TAKE ONE TABLET TWICE DAILY 180 tablet 0  . sacubitril-valsartan (ENTRESTO) 24-26 MG Take 1 tablet by mouth 2 (two) times daily. 60 tablet 3   No current facility-administered medications for this visit.    Allergies:   Patient has no known allergies.    Social History:  The patient  reports that he quit smoking about 24 years ago. His smoking use included cigarettes. He has a 120.00 pack-year smoking history. He has quit using smokeless tobacco. He reports that he does not drink alcohol and does not use drugs.  Family History:  The patient's family history includes Breast cancer in his sister; Coronary artery disease in his brother and father; Lung cancer in his brother; Prostate cancer in his brother; Stroke in his sister.    ROS:  Please see the history of present illness.   Otherwise, review of systems are positive for none.   All other systems are reviewed and negative.    PHYSICAL EXAM: VS:  BP 120/60 (BP Location: Left Arm, Patient Position: Sitting, Cuff Size: Normal)   Pulse 73   Ht 5' 10"  (1.778 m)   Wt 244 lb 8 oz (110.9 kg)   SpO2 94%   BMI 35.08 kg/m  , BMI Body mass index is 35.08 kg/m. GEN: Well nourished, well developed, in no acute distress  HEENT: normal  Neck:no JVD, no carotid bruits, or masses Cardiac: RRR; no  rubs, or gallops. . 2/6 systolic ejection murmur in the aortic area which is early peaking .  Mild bilateral leg edema Respiratory:  clear to auscultation bilaterally, normal work of breathing GI: soft, nontender, nondistended, + BS MS: no deformity or atrophy  Skin: warm and dry, no  rash Neuro:  Strength and sensation are intact Psych: euthymic mood, full affect   EKG:  EKG is ordered today. The ekg ordered today demonstrates normal sinus rhythm with left bundle branch block and first-degree AV block.  Recent Labs: 08/15/2019: ALT 32; Hemoglobin 12.7; Platelets 161 09/07/2019: BNP 144.7 09/29/2019: BUN 21; Creatinine, Ser 1.83; Potassium 4.6; Sodium 141    Lipid Panel    Component Value Date/Time   CHOL 130 04/03/2019 0837   CHOL 103 06/07/2014 0824   TRIG 199.0 (H) 04/03/2019 0837   HDL 45.90 04/03/2019 0837   HDL 38 (L) 06/07/2014 0824   CHOLHDL 3 04/03/2019 0837   VLDL 39.8 04/03/2019 0837   LDLCALC 44 04/03/2019 0837   LDLCALC 59 03/30/2018 0930      Wt Readings from Last 3 Encounters:  01/18/20 244 lb 8 oz (110.9 kg)  01/11/20 246 lb 12.8 oz (111.9 kg)  12/18/19 243 lb (110.2 kg)        ASSESSMENT AND PLAN:  1.  Coronary artery disease involving native coronary arteries without angina:: He is doing reasonably well with no anginal symptoms. Continue medical therapy. He is to stay on Plavix without aspirin due to prior gastric ulcers.  2. Chronic systolic heart failure:  Appears to be euvolemic on current dose of furosemide.  He is on optimal medical therapy including carvedilol, Entresto and [place].  He is no longer on spironolactone given GFR of 35 and admission this year for acute on chronic kidney disease.  I am going to repeat his echocardiogram.  If EF is below 35%, recommend referral to EP for ICD CRT evaluation.  He tends to fluctuate between New York Heart Association class II and class III.  3. Essential hypertension: Blood pressure is well controlled on current medications.  4. Hyperlipidemia: Continue treatment with atorvastatin. LDL has been consistently below 70.  Most recent lipid profile showed an LDL of 44.    5.  Type 2 diabetes.  Diabetes control has improved.     Disposition:   FU with me in 6  months  Signed,  [name], MD  01/18/2020 8:19 AM    [place]

## 2020-01-18 NOTE — Patient Instructions (Signed)
Medication Instructions:  Your physician recommends that you continue on your current medications as directed. Please refer to the Current Medication list given to you today.  *If you need a refill on your cardiac medications before your next appointment, please call your pharmacy*   Lab Work: None ordered If you have labs (blood work) drawn today and your tests are completely normal, you will receive your results only by: Marland Kitchen MyChart Message (if you have MyChart) OR . A paper copy in the mail If you have any lab test that is abnormal or we need to change your treatment, we will call you to review the results.   Testing/Procedures: Your physician has requested that you have an echocardiogram. Echocardiography is a painless test that uses sound waves to create images of your heart. It provides your doctor with information about the size and shape of your heart and how well your heart's chambers and valves are working. This procedure takes approximately one hour. There are no restrictions for this procedure.     Follow-Up: At [facility], you and your health needs are our priority.  As part of our continuing mission to provide you with exceptional heart care, we have created designated Provider Care Teams.  These Care Teams include your primary Cardiologist (physician) and Advanced Practice Providers (APPs -  Physician Assistants and Nurse Practitioners) who all work together to provide you with the care you need, when you need it.  We recommend signing up for the patient portal called "MyChart".  Sign up information is provided on this After Visit Summary.  MyChart is used to connect with patients for Virtual Visits (Telemedicine).  Patients are able to view lab/test results, encounter notes, upcoming appointments, etc.  Non-urgent messages can be sent to your provider as well.   To learn more about what you can do with MyChart, go to [email or web address].    Your next appointment:   6  month(s)  The format for your next appointment:   In Person  Provider:   You may see [name], MD or one of the following Advanced Practice Providers on your designated Care Team:    Murray Hodgkins, NP  Christell Faith, PA-C  Marrianne Mood, PA-C  Cadence [place], [place]  Laurann Montana, NP    Other Instructions   Echocardiogram An echocardiogram is a procedure that uses painless sound waves (ultrasound) to produce an image of the heart. Images from an echocardiogram can provide important information about:  Signs of coronary artery disease (CAD).  Aneurysm detection. An aneurysm is a weak or damaged part of an artery wall that bulges out from the normal force of blood pumping through the body.  Heart size and shape. Changes in the size or shape of the heart can be associated with certain conditions, including heart failure, aneurysm, and CAD.  Heart muscle function.  Heart valve function.  Signs of a past heart attack.  Fluid buildup around the heart.  Thickening of the heart muscle.  A tumor or infectious growth around the heart valves. Tell a health care provider about:  Any allergies you have.  All medicines you are taking, including vitamins, herbs, eye drops, creams, and over-the-counter medicines.  Any blood disorders you have.  Any surgeries you have had.  Any medical conditions you have.  Whether you are pregnant or may be pregnant. What are the risks? Generally, this is a safe procedure. However, problems may occur, including:  Allergic reaction to dye (contrast) that may be used during  the procedure. What happens before the procedure? No specific preparation is needed. You may eat and drink normally. What happens during the procedure?   An IV tube may be inserted into one of your veins.  You may receive a image enhancer through this tube. A image enhancer is an injection that improves the quality of the pictures from your heart.  A gel  will be applied to your chest.  A wand-like tool (transducer) will be moved over your chest. The gel will help to transmit the sound waves from the transducer.  The sound waves will harmlessly bounce off of your heart to allow the heart images to be captured in real-time motion. The images will be recorded on a computer. The procedure may vary among health care providers and hospitals. What happens after the procedure?  You may return to your normal, everyday life, including diet, activities, and medicines, unless your health care provider tells you not to do that. Summary  An echocardiogram is a procedure that uses painless sound waves (ultrasound) to produce an image of the heart.  Images from an echocardiogram can provide important information about the size and shape of your heart, heart muscle function, heart valve function, and fluid buildup around your heart.  You do not need to do anything to prepare before this procedure. You may eat and drink normally.  After the echocardiogram is completed, you may return to your normal, everyday life, unless your health care provider tells you not to do that. This information is not intended to replace advice given to you by your health care provider. Make sure you discuss any questions you have with your health care provider. Document Revised: 05/19/2018 Document Reviewed: 02/29/2016 Elsevier Patient Education  [place].

## 2020-01-22 ENCOUNTER — Other Ambulatory Visit: Payer: Self-pay | Admitting: *Deleted

## 2020-01-22 ENCOUNTER — Ambulatory Visit
Admission: RE | Admit: 2020-01-22 | Discharge: 2020-01-22 | Disposition: A | Discharge status: Home / Self Care | Payer: PPO | Source: Ambulatory Visit | Attending: Internal Medicine | Admitting: Internal Medicine

## 2020-01-22 ENCOUNTER — Inpatient Hospital Stay: Payer: PPO | Attending: Internal Medicine

## 2020-01-22 ENCOUNTER — Other Ambulatory Visit: Payer: Self-pay

## 2020-01-22 DIAGNOSIS — K766 Portal hypertension: Secondary | ICD-10-CM | POA: Insufficient documentation

## 2020-01-22 DIAGNOSIS — E785 Hyperlipidemia, unspecified: Secondary | ICD-10-CM | POA: Diagnosis not present

## 2020-01-22 DIAGNOSIS — K7460 Unspecified cirrhosis of liver: Secondary | ICD-10-CM | POA: Insufficient documentation

## 2020-01-22 DIAGNOSIS — Z7984 Long term (current) use of oral hypoglycemic drugs: Secondary | ICD-10-CM | POA: Diagnosis not present

## 2020-01-22 DIAGNOSIS — Z8549 Personal history of malignant neoplasm of other male genital organs: Secondary | ICD-10-CM | POA: Diagnosis not present

## 2020-01-22 DIAGNOSIS — I2510 Atherosclerotic heart disease of native coronary artery without angina pectoris: Secondary | ICD-10-CM | POA: Insufficient documentation

## 2020-01-22 DIAGNOSIS — I130 Hypertensive heart and chronic kidney disease with heart failure and stage 1 through stage 4 chronic kidney disease, or unspecified chronic kidney disease: Secondary | ICD-10-CM | POA: Diagnosis not present

## 2020-01-22 DIAGNOSIS — I255 Ischemic cardiomyopathy: Secondary | ICD-10-CM | POA: Insufficient documentation

## 2020-01-22 DIAGNOSIS — M2550 Pain in unspecified joint: Secondary | ICD-10-CM | POA: Diagnosis not present

## 2020-01-22 DIAGNOSIS — N189 Chronic kidney disease, unspecified: Secondary | ICD-10-CM | POA: Insufficient documentation

## 2020-01-22 DIAGNOSIS — Z79899 Other long term (current) drug therapy: Secondary | ICD-10-CM | POA: Diagnosis not present

## 2020-01-22 DIAGNOSIS — K219 Gastro-esophageal reflux disease without esophagitis: Secondary | ICD-10-CM | POA: Diagnosis not present

## 2020-01-22 DIAGNOSIS — K769 Liver disease, unspecified: Secondary | ICD-10-CM | POA: Insufficient documentation

## 2020-01-22 DIAGNOSIS — M549 Dorsalgia, unspecified: Secondary | ICD-10-CM | POA: Diagnosis not present

## 2020-01-22 DIAGNOSIS — I10 Essential (primary) hypertension: Secondary | ICD-10-CM | POA: Diagnosis not present

## 2020-01-22 DIAGNOSIS — E1122 Type 2 diabetes mellitus with diabetic chronic kidney disease: Secondary | ICD-10-CM | POA: Insufficient documentation

## 2020-01-22 DIAGNOSIS — Z87891 Personal history of nicotine dependence: Secondary | ICD-10-CM | POA: Insufficient documentation

## 2020-01-22 DIAGNOSIS — G8929 Other chronic pain: Secondary | ICD-10-CM | POA: Diagnosis not present

## 2020-01-22 DIAGNOSIS — K8020 Calculus of gallbladder without cholecystitis without obstruction: Secondary | ICD-10-CM | POA: Diagnosis not present

## 2020-01-22 LAB — CBC WITH DIFFERENTIAL/PLATELET
Abs Immature Granulocytes: 0.01 10*3/uL (ref 0.00–0.07)
Basophils Absolute: 0 10*3/uL (ref 0.0–0.1)
Basophils Relative: 1 %
Eosinophils Absolute: 0.1 10*3/uL (ref 0.0–0.5)
Eosinophils Relative: 1 %
HCT: 42.1 % (ref 39.0–52.0)
Hemoglobin: 13.7 g/dL (ref 13.0–17.0)
Immature Granulocytes: 0 %
Lymphocytes Relative: 18 %
Lymphs Abs: 1.2 10*3/uL (ref 0.7–4.0)
MCH: 30 pg (ref 26.0–34.0)
MCHC: 32.5 g/dL (ref 30.0–36.0)
MCV: 92.1 fL (ref 80.0–100.0)
Monocytes Absolute: 0.5 10*3/uL (ref 0.1–1.0)
Monocytes Relative: 7 %
Neutro Abs: 4.9 10*3/uL (ref 1.7–7.7)
Neutrophils Relative %: 73 %
Platelets: 184 10*3/uL (ref 150–400)
RBC: 4.57 MIL/uL (ref 4.22–5.81)
RDW: 16.2 % — ABNORMAL HIGH (ref 11.5–15.5)
WBC: 6.6 10*3/uL (ref 4.0–10.5)
nRBC: 0 % (ref 0.0–0.2)

## 2020-01-22 LAB — COMPREHENSIVE METABOLIC PANEL
ALT: 45 U/L — ABNORMAL HIGH (ref 0–44)
AST: 52 U/L — ABNORMAL HIGH (ref 15–41)
Albumin: 3.5 g/dL (ref 3.5–5.0)
Alkaline Phosphatase: 134 U/L — ABNORMAL HIGH (ref 38–126)
Anion gap: 11 (ref 5–15)
BUN: 17 mg/dL (ref 8–23)
CO2: 26 mmol/L (ref 22–32)
Calcium: 8.9 mg/dL (ref 8.9–10.3)
Chloride: 99 mmol/L (ref 98–111)
Creatinine, Ser: 1.32 mg/dL — ABNORMAL HIGH (ref 0.61–1.24)
GFR, Estimated: 58 mL/min — ABNORMAL LOW (ref >60)
Glucose, Bld: 150 mg/dL — ABNORMAL HIGH (ref 70–99)
Potassium: 4.1 mmol/L (ref 3.5–5.1)
Sodium: 136 mmol/L (ref 135–145)
Total Bilirubin: 0.8 mg/dL (ref 0.3–1.2)
Total Protein: 7.1 g/dL (ref 6.5–8.1)

## 2020-01-22 MED ORDER — GADOBUTROL 1 MMOL/ML IV SOLN
10.0000 mL | Freq: Once | INTRAVENOUS | Status: AC | PRN
  Administered 2020-01-22: 11:00:00 10 mL via INTRAVENOUS

## 2020-01-22 NOTE — Addendum Note (Signed)
Addended by: [name] on: 01/22/2020 07:24 AM   Modules accepted: Orders

## 2020-01-23 ENCOUNTER — Inpatient Hospital Stay: Payer: PPO | Admitting: Internal Medicine

## 2020-01-23 DIAGNOSIS — K769 Liver disease, unspecified: Secondary | ICD-10-CM | POA: Diagnosis not present

## 2020-01-23 DIAGNOSIS — Z8549 Personal history of malignant neoplasm of other male genital organs: Secondary | ICD-10-CM | POA: Diagnosis not present

## 2020-01-23 LAB — AFP TUMOR MARKER: AFP, Serum, Tumor Marker: 5.8 ng/mL (ref 0.0–8.3)

## 2020-01-23 NOTE — Progress Notes (Signed)
[place] NOTE  Patient Care Team: [name], MD as PCP - General (Family Medicine) [name], MD as PCP - Cardiology (Cardiology) Clent Jacks, RN as Oncology Nurse Navigator [name], [place] (Pharmacist)  CHIEF COMPLAINTS/PURPOSE OF CONSULTATION: Liver lesion  #  Oncology History Overview Note  # MRI NOV 2020- liver lesion- on [place] [MRI- 1.8 cm- incidental on Kidney US- Oct 2020];   # June 2017- Penile cancer- s/p surgery pT1a [Dr.Brandon]  # cirrhosis-[? Cause ? NASH; GI w/u- [place] 2017];   # colonoscopy-dec 2020 polyps [Dr.Wohl]; s/p resection; EGD - bleeding ulcer- ? Asprin [2016; Dr.Buccini; [place]]; CKD- III [40s]; CAD [Dr.Arida]   History of penile cancer  06/28/2015 Initial Diagnosis   Penile cancer (HCC)      HISTORY OF PRESENTING ILLNESS:  Devin Hanna 69 y.o.  male with a history of cirrhosis; stage I penile cancer; also history of chronic kidney disease; and a history of indeterminate liver lesion is here for follow-up/review results of the MRI.  Patient continues to deny any new symptoms.  Denies any leg swelling.  Denies any nausea vomiting.  Not any abdominal pain.  Admits to chronic joint pains back pain not any worse.  Review of Systems  Constitutional: Negative for chills, diaphoresis, fever, malaise/fatigue and weight loss.  HENT: Negative for nosebleeds and sore throat.   Eyes: Negative for double vision.  Respiratory: Negative for cough, hemoptysis, sputum production, shortness of breath and wheezing.   Cardiovascular: Negative for chest pain, palpitations, orthopnea and leg swelling.  Gastrointestinal: Negative for abdominal pain, blood in stool, constipation, diarrhea, heartburn, melena, nausea and vomiting.  Genitourinary: Negative for dysuria, frequency and urgency.  Musculoskeletal: Positive for back pain and joint pain.  Skin: Negative.  Negative for itching and rash.  Neurological: Negative for  dizziness, tingling, focal weakness, weakness and headaches.  Endo/Heme/Allergies: Does not bruise/bleed easily.  Psychiatric/Behavioral: Negative for depression. The patient is not nervous/anxious and does not have insomnia.      MEDICAL HISTORY:  Past Medical History:  Diagnosis Date  . Acute respiratory failure with hypoxia ([place]) 06/2016  . CAD (coronary artery disease)    a. Remote PCI of the RCA;  b. 03/2013 Inf STEMI/PCI: LAD 60-70d, RCA 99p ISR(rota/CBA/DES);  c. 09/2014 MV: inf and infsept infarct, EF 35-40%;  d. 09/2014 Cath: patent RCA stent, stable LAD dzs, EF 40%-->Med rx.  . CHF (congestive heart failure) ([place])   . Diabetes mellitus    sees Dr. Hardin Negus in [place]  . GERD (gastroesophageal reflux disease)   . HFrEF (heart failure with reduced ejection fraction) ([place])   . Hyperlipidemia   . Hypertension   . Ischemic cardiomyopathy    a. 09/2014 EF 40% by V gram;  b. 06/2016 Echo: EF 40, diff HK, Gr1 DD, inf HK, no mural thrombus. Mild MR, mildly dil LA, mildly dil RV.   . LBBB (left bundle branch block)    Old  . Liver mass   . Morbid obesity ([place])   . Peripheral neuropathy   . Upper GI bleed   . Vertigo     SURGICAL HISTORY: Past Surgical History:  Procedure Laterality Date  . CARDIAC CATHETERIZATION N/A 10/03/2014   Procedure: Left Heart Cath and Coronary Angiography;  Surgeon: [name], MD;  Location: [facility];  Service: Cardiovascular;  Laterality: N/A;  . CIRCUMCISION N/A 07/22/2015   Procedure: CIRCUMCISION ADULT;  Surgeon: Hollice Espy, MD;  Location: [facility];  Service: Urology;  Laterality: N/A;  . COLONOSCOPY WITH PROPOFOL N/A 01/12/2019   Procedure: COLONOSCOPY WITH PROPOFOL;  Surgeon: Lucilla Lame, MD;  Location: [facility] ENDOSCOPY;  Service: Endoscopy;  Laterality: N/A;  . CORONARY STENT PLACEMENT    . ESOPHAGOGASTRODUODENOSCOPY (EGD) WITH PROPOFOL N/A 09/12/2014   Procedure: ESOPHAGOGASTRODUODENOSCOPY (EGD) WITH PROPOFOL;  Surgeon: Ronald Lobo, MD;  Location: [facility] ENDOSCOPY;  Service: Endoscopy;  Laterality: N/A;  . EXTERNAL FIXATION LEG Left 04/03/2012   Procedure: EXTERNAL FIXATION LEG;  Surgeon: Wylene Simmer, MD;  Location: [facility];  Service: Orthopedics;  Laterality: Left;  . EXTERNAL FIXATION REMOVAL Left 04/14/2012   Procedure: REMOVAL EXTERNAL FIXATION LEG;  Surgeon: Wylene Simmer, MD;  Location: [place];  Service: Orthopedics;  Laterality: Left;  . IRRIGATION AND DEBRIDEMENT KNEE Left 04/03/2012   Procedure: IRRIGATION AND DEBRIDEMENT KNEE;  Surgeon: Wylene Simmer, MD;  Location: [place];  Service: Orthopedics;  Laterality: Left;  . LEFT HEART CATHETERIZATION WITH CORONARY ANGIOGRAM N/A 04/05/2013   Procedure: LEFT HEART CATHETERIZATION WITH CORONARY ANGIOGRAM;  Surgeon: [name], MD;  Location: [place] [facility] LAB;  Service: Cardiovascular;  Laterality: N/A;  . MASS EXCISION N/A 07/22/2015   Procedure: EXCISION PENILE MASS;  Surgeon: Hollice Espy, MD;  Location: [facility];  Service: Urology;  Laterality: N/A;  . ORIF TIBIA PLATEAU Left 04/14/2012   Procedure: OPEN REDUCTION INTERNAL FIXATION (ORIF) TIBIAL PLATEAU;  Surgeon: Wylene Simmer, MD;  Location: [place];  Service: Orthopedics;  Laterality: Left;  . PERCUTANEOUS CORONARY ROTOBLATOR INTERVENTION (PCI-R) N/A 04/06/2013   Procedure: PERCUTANEOUS CORONARY ROTOBLATOR INTERVENTION (PCI-R);  Surgeon: [name], MD;  Location: [facility] CATH LAB;  Service: Cardiovascular;  Laterality: N/A;  . RIGHT/LEFT HEART CATH AND CORONARY ANGIOGRAPHY N/A 08/21/2019   Procedure: RIGHT/LEFT HEART CATH AND CORONARY ANGIOGRAPHY;  Surgeon: [name], MD;  Location: [place] [facility];  Service: Cardiovascular;  Laterality: N/A;    SOCIAL HISTORY: Social History   Socioeconomic History  . Marital status: Divorced    Spouse name: Not on file  . Number of children: Not on file  . Years of education: Not on file  . Highest education level: Not on file  Occupational History  . Occupation: heavy  Company secretary    Employer: [employer]  Tobacco Use  . Smoking status: Former Smoker    Packs/day: 3.00    Years: 40.00    Pack years: 120.00    Types: Cigarettes    Quit date: 04/05/1995    Years since quitting: 24.8  . Smokeless tobacco: Former Systems developer  . Tobacco comment: started smoking at age 55  Vaping Use  . Vaping Use: Never used  Substance and Sexual Activity  . Alcohol use: No    Comment: Occassional Use  . Drug use: No  . Sexual activity: Not on file  Other Topics Concern  . Not on file  Social History Narrative   Heavy Company secretary.; Divorced; quit smoking 25 years; beer rare; by self. Still riding motor cycles.    Social Determinants of Health   Financial Resource Strain: Medium Risk  . Difficulty of Paying Living Expenses: Somewhat hard  Food Insecurity: Not on file  Transportation Needs: Not on file  Physical Activity: Not on file  Stress: Not on file  Social Connections: Not on file  Intimate Partner Violence: Not on file    FAMILY HISTORY: Family History  Problem Relation Age of Onset  . Coronary artery disease Father   . Coronary artery disease Brother   .  Lung cancer Brother   . Breast cancer Sister   . Prostate cancer Brother   . Stroke Sister     ALLERGIES:  has No Known Allergies.  MEDICATIONS:  Current Outpatient Medications  Medication Sig Dispense Refill  . atorvastatin (LIPITOR) 40 MG tablet Take 1 tablet (40 mg total) by mouth daily. 90 tablet 0  . carvedilol (COREG) 6.25 MG tablet Take 6.25 mg by mouth 2 (two) times daily with a meal.     . CINNAMON PO Take 1,000 mg by mouth in the morning and at bedtime.    . clopidogrel (PLAVIX) 75 MG tablet TAKE ONE TABLET EVERY MORNING WITH BREAKFAST 90 tablet 3  . dapagliflozin propanediol ([name]) 10 MG TABS tablet Take 1 tablet (10 mg total) by mouth daily before breakfast. 30 tablet 3  . Dulaglutide ([name]) 1.5 WC/5.8NI SOPN Inject 1.5 mg into the skin once a week. 6 mL 3  .  ferrous sulfate 325 (65 FE) MG tablet Take 325 mg by mouth daily with breakfast.    . furosemide (LASIX) 40 MG tablet Take 1 tablet (40 mg total) by mouth daily. 90 tablet 3  . gabapentin (NEURONTIN) 300 MG capsule Take 1 capsule (300 mg total) by mouth 3 (three) times daily. 90 capsule 3  . hydrALAZINE (APRESOLINE) 25 MG tablet TAKE ONE TABLET EACH MORNING AND AT BEDTIME 60 tablet 2  . Multiple Vitamin (MULTIVITAMIN WITH MINERALS) TABS Take 1 tablet by mouth daily. Multivitamin for Men 66+    . nitroGLYCERIN (NITROSTAT) 0.4 MG SL tablet Place 1 tablet (0.4 mg total) under the tongue every 5 (five) minutes as needed for chest pain. 25 tablet 3  . pantoprazole (PROTONIX) 40 MG tablet TAKE ONE TABLET TWICE DAILY 180 tablet 1  . sacubitril-valsartan (ENTRESTO) 24-26 MG Take 1 tablet by mouth 2 (two) times daily. 60 tablet 3   No current facility-administered medications for this visit.      Marland Kitchen  PHYSICAL EXAMINATION: ECOG PERFORMANCE STATUS: 0 - Asymptomatic  Vitals:   01/23/20 1110  BP: 115/60  Pulse: 67  Resp: 18  Temp: 98 F (36.7 C)  SpO2: 99%   Filed Weights   01/23/20 1110  Weight: 244 lb (110.7 kg)    Physical Exam Constitutional:      Comments: Patient is alone.  Is ambulating independently.  HENT:     Head: Normocephalic and atraumatic.     Mouth/Throat:     Pharynx: No oropharyngeal exudate.  Eyes:     Pupils: Pupils are equal, round, and reactive to light.  Cardiovascular:     Rate and Rhythm: Normal rate and regular rhythm.  Pulmonary:     Effort: Pulmonary effort is normal. No respiratory distress.     Breath sounds: Normal breath sounds. No wheezing.  Abdominal:     General: Bowel sounds are normal. There is no distension.     Palpations: Abdomen is soft. There is no mass.     Tenderness: There is no abdominal tenderness. There is no guarding or rebound.  Musculoskeletal:        General: No tenderness. Normal range of motion.     Cervical back: Normal  range of motion and neck supple.  Skin:    General: Skin is warm.  Neurological:     Mental Status: He is alert and oriented to person, place, and time.  Psychiatric:        Mood and Affect: Affect normal.      LABORATORY DATA:  I  have reviewed the data as listed Lab Results  Component Value Date   WBC 6.6 01/22/2020   HGB 13.7 01/22/2020   HCT 42.1 01/22/2020   MCV 92.1 01/22/2020   PLT 184 01/22/2020   Recent Labs    07/30/19 1100 07/30/19 1104 08/08/19 1525 08/15/19 0835 09/07/19 0952 09/22/19 0801 09/29/19 0854 01/22/20 1109  NA 136   < > 144 139 140 138 141 136  K 6.1*   < > 4.5 4.5 4.7 4.3 4.6 4.1  CL 103   < > 102 101 101 103 103 99  CO2 21*   < > 21 25 22 25 30 26   GLUCOSE 268*   < > 95 141* 152* 131* 117* 150*  BUN 21   < > 21 22 25  29* 21 17  CREATININE 1.81*   < > 1.48* 1.77* 1.74* 1.95* 1.83* 1.32*  CALCIUM 8.8*   < > 9.3 8.8* 9.4 9.0 8.9 8.9  GFRNONAA 37*   < > 47* 38* 39*  --   --  58*  GFRAA 43*   < > 54* 44* 45*  --   --   --   PROT 6.7  --   --  6.8  --   --   --  7.1  ALBUMIN 3.3*  --   --  3.7  --   --   --  3.5  AST 60*  --   --  50*  --   --   --  52*  ALT 36  --   --  32  --   --   --  45*  ALKPHOS 86  --   --  79  --   --   --  134*  BILITOT 1.7*  --   --  1.2  --   --   --  0.8   < > = values in this interval not displayed.    RADIOGRAPHIC STUDIES: I have personally reviewed the radiological images as listed and agreed with the findings in the report. MR LIVER W WO CONTRAST  Result Date: 01/22/2020 CLINICAL DATA:  Cirrhosis.  Follow-up indeterminate liver lesion. EXAM: MRI ABDOMEN WITHOUT AND WITH CONTRAST TECHNIQUE: Multiplanar multisequence MR imaging of the abdomen was performed both before and after the administration of intravenous contrast. CONTRAST:  28m GADAVIST GADOBUTROL 1 MMOL/ML IV SOLN COMPARISON:  07/28/2019 FINDINGS: Lower chest: No acute findings. Hepatobiliary: Hepatic cirrhosis is again demonstrated. Multiple small  lesions are again seen in the right hepatic lobe which show arterial phase hyperenhancement, but no evidence of contrast washout or delayed peripheral rim enhancement. Index lesion in the anterior right hepatic lobe measuring 1.3 cm on image 44/12, and these remains stable since previous study. No new hepatic masses are identified. A few tiny gallstones are again seen, however there is no evidence of cholecystitis or biliary ductal dilatation. Pancreas:  No mass or inflammatory changes. Spleen:  Within normal limits in size and appearance. Adrenals/Urinary Tract: No masses identified. Stable tiny right renal cyst. No evidence of hydronephrosis. Stomach/Bowel: Visualized portion unremarkable. Vascular/Lymphatic: No pathologically enlarged lymph nodes identified. No abdominal aortic aneurysm. Venous collaterals seen in the gastrohepatic and gastrosplenic ligaments, consistent with portal venous hypertension. Other:  None.  No evidence of abdominal ascites. Musculoskeletal:  No suspicious bone lesions identified. IMPRESSION: Hepatic cirrhosis and findings of portal venous hypertension. Multiple small hypervascular lesions in the right hepatic lobe remain stable, and are indeterminate. (LI-RADS Category 3: Intermediate probability of malignancy). Recommend  continued follow-up by MRI in 6 months. Cholelithiasis.  No radiographic evidence of cholecystitis. Electronically Signed   By: Marlaine Hind M.D.   On: 01/22/2020 13:27    ASSESSMENT & PLAN:   Liver lesion #Liver lesion-approximately 1.8 cm; concerning for primary hepatocellular cancer/in the context of cirrhosis vs-metastatic [less likely] vs. benign. Difficult to Biopsy; DEC 2021-MRI liver-again inconclusive; however stable lesion.  Repeat imaging in 6 months  # Hepatic cirrhosis and findings of portal venous hypertension-on MRI-no evidence of decompensation.  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 Continue to monitor.  #Penile cancer [2017] stage I-resection  of the lesion.  Stable.  No clinical evidence of recurrence.  Unlikely cause of patient's liver lesion.  # # I offered to call the patient's daughtes to give an update on clinical status; patient declines.   # DISPOSITION: # follow up in 6 months- MD; labs- cbc/cmp/AFP; MRI liver prior-Dr.B  # I reviewed the blood work- with the patient in detail; also reviewed the imaging independently [as summarized above]; and with the patient in detail.    All questions were answered. The patient knows to call the clinic with any problems, questions or concerns.    Cammie Sickle, MD 01/24/2020 7:45 AM

## 2020-01-23 NOTE — Assessment & Plan Note (Addendum)
#  Liver lesion-approximately 1.8 cm; concerning for primary hepatocellular cancer/in the context of cirrhosis vs-metastatic [less likely] vs. benign. Difficult to Biopsy; DEC 2021-MRI liver-again inconclusive; however stable lesion.  Repeat imaging in 6 months  # Hepatic cirrhosis and findings of portal venous hypertension-on MRI-no evidence of decompensation.  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 Continue to monitor.  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  Stable.  No clinical evidence of recurrence.  Unlikely cause of patient's liver lesion.  # # I offered to call the patient's daughtes to give an update on clinical status; patient declines.   # DISPOSITION: # follow up in 6 months- MD; labs- cbc/cmp/AFP; MRI liver prior-Dr.B  # I reviewed the blood work- with the patient in detail; also reviewed the imaging independently [as summarized above]; and with the patient in detail.

## 2020-01-25 ENCOUNTER — Ambulatory Visit: Payer: PPO | Admitting: Pharmacist

## 2020-01-25 DIAGNOSIS — I5022 Chronic systolic (congestive) heart failure: Secondary | ICD-10-CM

## 2020-01-25 DIAGNOSIS — E1142 Type 2 diabetes mellitus with diabetic polyneuropathy: Secondary | ICD-10-CM

## 2020-01-25 DIAGNOSIS — I25118 Atherosclerotic heart disease of native coronary artery with other forms of angina pectoris: Secondary | ICD-10-CM

## 2020-01-25 MED ORDER — TRULICITY 3 MG/0.5ML ~~LOC~~ SOAJ: 3.0000 mg | SUBCUTANEOUS | 0 refills | Status: DC

## 2020-01-25 NOTE — Chronic Care Management (AMB) (Signed)
Chronic Care Management   Pharmacy Note  01/25/2020 Name: Devin Hanna MRN: [id number] DOB: [birth year]  Subjective:  Devin Hanna is a 69 y.o. year old male who is a primary care patient of [name], Angela Adam, MD. The CCM team was consulted for assistance with chronic disease management and care coordination needs.    Engaged with patient by telephone for follow up visit in response to provider referral for pharmacy case management and/or care coordination services.   Consent to Services:  Devin Hanna was given information about Chronic Care Management services, agreed to services, and gave verbal consent prior to initiation of services on 09/04/2019. Please see initial visit note for detailed documentation.   Objective:  Lab Results  Component Value Date   CREATININE 1.32 (H) 01/22/2020   CREATININE 1.83 (H) 09/29/2019   CREATININE 1.95 (H) 09/22/2019    Lab Results  Component Value Date   HGBA1C 5.9 (A) 10/10/2019       Component Value Date/Time   CHOL 130 04/03/2019 0837   CHOL 103 06/07/2014 0824   TRIG 199.0 (H) 04/03/2019 0837   HDL 45.90 04/03/2019 0837   HDL 38 (L) 06/07/2014 0824   CHOLHDL 3 04/03/2019 0837   VLDL 39.8 04/03/2019 0837   LDLCALC 44 04/03/2019 0837   LDLCALC 59 03/30/2018 0930     BP Readings from Last 3 Encounters:  01/23/20 115/60  01/18/20 120/60  01/11/20 130/70    Assessment/Interventions: Review of patient past medical history, allergies, medications, health status, including review of consultants reports, laboratory and other test data, was performed as part of comprehensive evaluation and provision of chronic care management services.   SDOH (Social Determinants of Health) assessments and interventions performed:  SDOH Interventions   Flowsheet Row Most Recent Value  SDOH Interventions   Financial Strain Interventions Other (Comment)  [manufacturer assistance]       CCM Care Plan  No Known Allergies  Medications Reviewed  Today    Reviewed by [name], RPH-CPP (Pharmacist) on 01/25/20 at 1000  Med List Status: <None>  Medication Order Taking? Sig Documenting Provider Last Dose Status Informant  atorvastatin (LIPITOR) 40 MG tablet [id number] Yes Take 1 tablet (40 mg total) by mouth daily. Theora Gianotti, NP Taking Active   carvedilol (COREG) 6.25 MG tablet [id number] Yes Take 6.25 mg by mouth 2 (two) times daily with a meal.  [provider] Taking Active            Med Note Darnelle Maffucci, Waynette Buttery Dec 18, 2019 10:27 AM) Taking 1/2 of 12.5 mg BID for 6.25 mg BID  CINNAMON PO [id number] Yes Take 1,000 mg by mouth in the morning and at bedtime. [provider] Taking Active Family Member  clopidogrel (PLAVIX) 75 MG tablet [id number] Yes TAKE ONE TABLET EVERY MORNING WITH BREAKFAST [name], NP Taking Active   dapagliflozin propanediol ([name]) 10 MG TABS tablet [id number] Yes Take 1 tablet (10 mg total) by mouth daily before breakfast. [name], MD Taking Active   Dulaglutide ([name]) 1.5 AQ/7.6AU [facility] [id number] Yes Inject 1.5 mg into the skin once a week. [name], MD Taking Active   ferrous sulfate 325 (65 FE) MG tablet [id number] Yes Take 325 mg by mouth daily with breakfast. [provider] Taking Active   furosemide (LASIX) 40 MG tablet [id number] Yes Take 1 tablet (40 mg total) by mouth daily. [name], NP Taking Active   gabapentin (NEURONTIN) 300 MG  capsule [id number] Yes Take 1 capsule (300 mg total) by mouth 3 (three) times daily. [name], MD Taking Active   hydrALAZINE (APRESOLINE) 25 MG tablet [id number] Yes TAKE ONE TABLET EACH MORNING AND AT BEDTIME [name], MD Taking Active   Multiple Vitamin (MULTIVITAMIN WITH MINERALS) TABS [id number] Yes Take 1 tablet by mouth daily. Multivitamin for Men 50+ [provider] Taking Active Family Member  nitroGLYCERIN (NITROSTAT) 0.4 MG SL tablet [id number] No  Place 1 tablet (0.4 mg total) under the tongue every 5 (five) minutes as needed for chest pain.  Patient not taking: Reported on 01/25/2020   [name], MD Not Taking Active Family Member  pantoprazole (PROTONIX) 40 MG tablet [id number] Yes TAKE ONE TABLET TWICE DAILY [name], NP Taking Active   sacubitril-valsartan (ENTRESTO) 24-26 MG [id number] Yes Take 1 tablet by mouth 2 (two) times daily. [name], NP Taking Active Family Member          Patient Active Problem List   Diagnosis Date Noted  . Cough 01/11/2020  . Left knee pain 01/11/2020  . Abnormal cardiovascular stress test   . Acute on chronic systolic heart failure ([place]) 07/30/2019  . AK (actinic keratosis) 04/03/2019  . Colon cancer screening   . Rectal polyp   . Polyp of transverse colon   . Liver lesion 12/06/2018  . Chronic kidney disease (CKD), stage III (moderate) ([place]) 12/06/2018  . Abrasion 12/06/2018  . History of anemia 03/30/2018  . Hyperpigmented skin lesion 12/20/2017  . Bilateral foot pain 12/20/2017  . DDD (degenerative disc disease), lumbar 08/18/2017  . SCC (squamous cell carcinoma), trunk 05/17/2017  . Venous insufficiency 02/12/2017  . Decreased pedal pulses 02/12/2017  . Skin lesion 02/12/2017  . Neuropathy 01/09/2016  . Elevated LFTs 06/28/2015  . History of penile cancer 06/28/2015  . Peptic ulcer disease 09/12/2014  . Erectile dysfunction 09/07/2013  . Chronic systolic heart failure ([place]) 05/15/2013  . Hyperlipidemia 05/15/2013  . DM type 2 (diabetes mellitus, type 2) ([place]) 04/07/2013  . Other and unspecified hyperlipidemia 04/07/2013  . Morbid obesity ([place]) 04/07/2013  . HTN (hypertension) 04/07/2013  . CAD (coronary artery disease) 04/04/2013    Conditions to be addressed/monitored per PCP order: HTN, HLD and DMII  Patient Care Plan: Medication Management    Problem Identified: Diabetes, CHF, HTN     Long-Range Goal: Disease Progression Prevention   Recent  Progress: On track  Priority: High  Note:   Current Barriers:  . Unable to independently afford treatment regimen . Unable to achieve control of diabetes  . Complex patient with multiple comorbidities including HFrEF, CAD, HLD  Pharmacist Clinical Goal(s):  Marland Kitchen Over the next 90 days, patient will verbalize ability to afford treatment regimen. . Over the next 90 days, patient will achieve adherence to monitoring guidelines and medication adherence to achieve therapeutic efficacy. . Over the next 90 days, patient will achieve control of diabetes as evidenced by improvement in A1c  Interventions: . Inter-disciplinary care team collaboration (see longitudinal plan of care) . Comprehensive medication review performed; medication list updated in electronic medical record  Diabetes: . Uncontrolled per SMBG reports; current treatment: [name] 1.5 mg weekly (using 2 0.75 mg pens weekly, has been doing this for about 4-5 weeks) [name] 10 mg daily o Metformin d/c d/t renal function  . Current glucose readings:  Fasting Evening  188 206  129 184  144 161  150 188  165 230  155 206  149  167  130 185  176 215  137 174  178 175  176 180  180 164  162 195  149 183  159   158 188   . Increase [name] to 3 mg weekly. Patient received 3 month supply of [name] 1.5 mg pens from [facility], so he will inject 2 of these weekly to equal 3 mg weekly. Continue [name] 10 mg daily.  [place] to update script for 3 mg strength of [name].  . Encouraged continued BID glucose checks.  [employer] reapplication for [place] in process. Will continue to follow w/ CPhT for assistance in following up with the company in processing . Due for f/u lab work per PCP and f/u appt with him in ~ 4 months. Will collaborate w/ office staff for scheduling  HFrEF, (most recent EF 25-30%) . Appropriately managed; current treatment: furosemide 40 mg daily carvedilol 6.25 mg BID, Entresto 24/26 mg BID;  hydralazine 25 mg BID, follows w/ Dr. Fletcher Anon. Upcoming repeat ECHO o Spironolactone held d/t renal fx; Marland Kitchen Home BP readings: variable, SBP 110-140/60s-80; HR 60-80s . Recommended to continue current regimen w/ collaboration w/ cardiology . [employer] reapplication for [employer] in process. Will continue to follow w/ CPhT for assistance in following up with the company in processing  Hyperlipidemia, secondary ASCVD prevention (hx MI w/ RCA stenting) . Controlled; current treatment: atorvastatin 40 mg daily . Antiplatelet regimen: clopidogrel 75 mg daily . Recommended to continue current regimen at this time  GERD: . Appropriately managed; current treatment: pantoprazole 40 mg BID . Continue current regimen  Patient Goals/Self-Care Activities . Over the next 90 days, patient will:  - take medications as prescribed check blood glucose BID, document, and provide at future appointments check blood pressure BID, document, and provide at future appointments collaborate with provider on medication access solutions  Follow Up Plan: Telephone follow up appointment with care management team member scheduled for: ~  6 weeks      Medication Assistance: Application for [employer] [name]) and [employer]) medication assistance program in process. Anticipated assistance start date TBD. [name] approved through [employer] through 12/32/22. See plan of care above for additional detail.   Plan: Telephone follow up appointment with care management team member scheduled for:~ 6 weeks  [name], [name], [place], [place] Clinical Pharmacist [employer] at [employer] [phone]

## 2020-01-25 NOTE — Patient Instructions (Addendum)
Visit Information  Patient Care Plan: Medication Management    Problem Identified: Diabetes, CHF, HTN     Long-Range Goal: Disease Progression Prevention   Recent Progress: On track  Priority: High  Note:   Current Barriers:  . Unable to independently afford treatment regimen . Unable to achieve control of diabetes  . Complex patient with multiple comorbidities including HFrEF, CAD, HLD  Pharmacist Clinical Goal(s):  Marland Kitchen Over the next 90 days, patient will verbalize ability to afford treatment regimen. . Over the next 90 days, patient will achieve adherence to monitoring guidelines and medication adherence to achieve therapeutic efficacy. . Over the next 90 days, patient will achieve control of diabetes as evidenced by improvement in A1c  Interventions: . Inter-disciplinary care team collaboration (see longitudinal plan of care) . Comprehensive medication review performed; medication list updated in electronic medical record  Diabetes: . Uncontrolled per SMBG reports; current treatment: [name] 1.5 mg weekly (using 2 0.75 mg pens weekly, has been doing this for about 4-5 weeks) [name] 10 mg daily o Metformin d/c d/t renal function  . Current glucose readings:  Fasting Evening  188 206  129 184  144 161  150 188  165 230  155 206  149 167  130 185  176 215  137 174  178 175  176 180  180  164  162 195  149 183  159   158 188   . Increase [name] to 3 mg weekly. Patient received 3 month supply of [name] 1.5 mg pens from [place], so he will inject 2 of these weekly to equal 3 mg weekly. Continue [name] 10 mg daily.  [place] to update script for 3 mg strength of [name].  . Encouraged continued BID glucose checks.  [employer] reapplication for [place] in process. Will continue to follow w/ CPhT for assistance in following up with the company in processing . Due for f/u lab work per PCP and f/u appt with him in ~ 4 months. Will collaborate w/ office staff  for scheduling  HFrEF, (most recent EF 25-30%) . Appropriately managed; current treatment: furosemide 40 mg daily carvedilol 6.25 mg BID, Entresto 24/26 mg BID; hydralazine 25 mg BID, follows w/ Dr. Fletcher Anon. Upcoming repeat ECHO o Spironolactone held d/t renal fx; Marland Kitchen Home BP readings: variable, SBP 110-140/60s-80; HR 60-80s . Recommended to continue current regimen w/ collaboration w/ cardiology . [employer] reapplication for [employer] in process. Will continue to follow w/ CPhT for assistance in following up with the company in processing  Hyperlipidemia, secondary ASCVD prevention (hx MI w/ RCA stenting) . Controlled; current treatment: atorvastatin 40 mg daily . Antiplatelet regimen: clopidogrel 75 mg daily . Recommended to continue current regimen at this time  GERD: . Appropriately managed; current treatment: pantoprazole 40 mg BID . Continue current regimen  Patient Goals/Self-Care Activities . Over the next 90 days, patient will:  - take medications as prescribed check blood glucose BID, document, and provide at future appointments check blood pressure BID, document, and provide at future appointments collaborate with provider on medication access solutions  Follow Up Plan: Telephone follow up appointment with care management team member scheduled for: ~  6 weeks       The patient verbalized understanding of instructions, educational materials, and care plan provided today and declined offer to receive copy of patient instructions, educational materials, and care plan.    Plan: Telephone follow up appointment with care management team member scheduled for:~ 6 weeks  [name],  [name], [place], [place] Clinical Pharmacist [employer] at [place]

## 2020-02-13 ENCOUNTER — Other Ambulatory Visit: Payer: PPO

## 2020-02-14 ENCOUNTER — Other Ambulatory Visit: Payer: Self-pay

## 2020-02-14 ENCOUNTER — Other Ambulatory Visit: Payer: Self-pay | Admitting: Cardiovascular Disease

## 2020-02-14 ENCOUNTER — Other Ambulatory Visit (INDEPENDENT_AMBULATORY_CARE_PROVIDER_SITE_OTHER): Payer: PPO

## 2020-02-14 DIAGNOSIS — I25118 Atherosclerotic heart disease of native coronary artery with other forms of angina pectoris: Secondary | ICD-10-CM

## 2020-02-14 DIAGNOSIS — E1142 Type 2 diabetes mellitus with diabetic polyneuropathy: Secondary | ICD-10-CM

## 2020-02-14 LAB — LIPID PANEL
Cholesterol: 92 mg/dL (ref 0–200)
HDL: 31 mg/dL — ABNORMAL LOW (ref >39.00)
LDL Cholesterol: 23 mg/dL (ref 0–99)
NonHDL: 61.11
Total CHOL/HDL Ratio: 3
Triglycerides: 190 mg/dL — ABNORMAL HIGH (ref 0.0–149.0)
VLDL: 38 mg/dL (ref 0.0–40.0)

## 2020-02-14 LAB — HEMOGLOBIN A1C: Hgb A1c MFr Bld: 7.1 % — ABNORMAL HIGH (ref 4.6–6.5)

## 2020-02-19 ENCOUNTER — Telehealth: Payer: Self-pay

## 2020-02-19 NOTE — Telephone Encounter (Signed)
Approval letter from [employer] for patient assistance for [facility] received. Patient is approved through Feb 08, 2021.  Patient ID: [id number]

## 2020-02-20 ENCOUNTER — Ambulatory Visit (INDEPENDENT_AMBULATORY_CARE_PROVIDER_SITE_OTHER): Payer: PPO

## 2020-02-20 ENCOUNTER — Other Ambulatory Visit: Payer: Self-pay

## 2020-02-20 DIAGNOSIS — I5022 Chronic systolic (congestive) heart failure: Secondary | ICD-10-CM | POA: Diagnosis not present

## 2020-02-20 LAB — ECHOCARDIOGRAM COMPLETE
Area-P 1/2: 1.96 cm2
S' Lateral: 4.3 cm

## 2020-02-25 ENCOUNTER — Other Ambulatory Visit: Payer: Self-pay | Admitting: Family Medicine

## 2020-02-25 DIAGNOSIS — I10 Essential (primary) hypertension: Secondary | ICD-10-CM

## 2020-02-28 ENCOUNTER — Telehealth: Payer: Self-pay | Admitting: Cardiovascular Disease

## 2020-02-28 DIAGNOSIS — I5022 Chronic systolic (congestive) heart failure: Secondary | ICD-10-CM

## 2020-02-28 DIAGNOSIS — I5023 Acute on chronic systolic (congestive) heart failure: Secondary | ICD-10-CM

## 2020-02-28 NOTE — Telephone Encounter (Signed)
DPR on file. Pts daughter [name] made aware of echo results and Dr. Tyrell Antonio recommendation. [name] is agreeable with the ref to EP. Adv her that a scheduler will contact her to schedule.

## 2020-02-28 NOTE — Telephone Encounter (Signed)
Patient's daughter would like echocardiogram results.

## 2020-02-28 NOTE — Telephone Encounter (Signed)
-----   Message from [name], MD sent at 02/26/2020  3:55 PM EST ----- Inform patient that echo showed slight improvement in ejection fraction to 30 to 35%.  Recommend referral to EP for evaluation of ICD/CRT.

## 2020-03-07 ENCOUNTER — Other Ambulatory Visit: Payer: Self-pay | Admitting: Cardiovascular Disease

## 2020-03-08 ENCOUNTER — Ambulatory Visit: Payer: PPO | Admitting: Pharmacist

## 2020-03-08 DIAGNOSIS — E1142 Type 2 diabetes mellitus with diabetic polyneuropathy: Secondary | ICD-10-CM

## 2020-03-08 DIAGNOSIS — K7460 Unspecified cirrhosis of liver: Secondary | ICD-10-CM

## 2020-03-08 DIAGNOSIS — I10 Essential (primary) hypertension: Secondary | ICD-10-CM

## 2020-03-08 DIAGNOSIS — I5022 Chronic systolic (congestive) heart failure: Secondary | ICD-10-CM

## 2020-03-08 DIAGNOSIS — N1832 Chronic kidney disease, stage 3b: Secondary | ICD-10-CM

## 2020-03-08 DIAGNOSIS — I25118 Atherosclerotic heart disease of native coronary artery with other forms of angina pectoris: Secondary | ICD-10-CM

## 2020-03-08 NOTE — Patient Instructions (Addendum)
Mr. Summons,   It was great talking with you today!  Please strongly consider getting vaccinated against COVID. Dr. [name] and I firmly believe in the benefits of this vaccine. You can call your [facility] pharmacy to get schedule OR you can call the [place] vaccine line to get scheduled [phone] or the website [email or web address]).   Enclosed are more blood sugar and blood pressure logs.   Keep up the great work!  [name], [name] [phone]  Visit Information  Goals Addressed              This Visit's Progress     Patient Stated   .  Medication Monitoring (pt-stated)         Patient Goals/Self-Care Activities . Over the next 90 days, patient will:  - take medications as prescribed check blood glucose BID, document, and provide at future appointments check blood pressure BID, document, and provide at future appointments collaborate with provider on medication access solutions          The patient verbalized understanding of instructions, educational materials, and care plan provided today and agreed to receive a mailed copy of patient instructions, educational materials, and care plan.   Plan: Telephone follow up appointment with care management team member scheduled for:  ~ 12 weeks  [name], [name], [place], [place] Clinical Pharmacist [employer] at [employer] [phone]

## 2020-03-08 NOTE — Chronic Care Management (AMB) (Signed)
Chronic Care Management Pharmacy Note  03/08/2020 Name:  Devin Hanna MRN:  [id number] DOB:  May 30, 1950  Subjective: Devin Hanna is an 70 y.o. year old male who is a primary patient of [name], Angela Adam, MD.  The CCM team was consulted for assistance with disease management and care coordination needs.    Engaged with patient by telephone for follow up visit in response to provider referral for pharmacy case management and/or care coordination services.   Consent to Services:  The patient was given information about Chronic Care Management services, agreed to services, and gave verbal consent prior to initiation of services.  Please see initial visit note for detailed documentation.   Objective:  Lab Results  Component Value Date   CREATININE 1.32 (H) 01/22/2020   CREATININE 1.83 (H) 09/29/2019   CREATININE 1.95 (H) 09/22/2019    Lab Results  Component Value Date   HGBA1C 7.1 (H) 02/14/2020       Component Value Date/Time   CHOL 92 02/14/2020 0812   CHOL 103 06/07/2014 0824   TRIG 190.0 (H) 02/14/2020 0812   HDL 31.00 (L) 02/14/2020 0812   HDL 38 (L) 06/07/2014 0824   CHOLHDL 3 02/14/2020 0812   VLDL 38.0 02/14/2020 0812   LDLCALC 23 02/14/2020 0812   LDLCALC 59 03/30/2018 0930      BP Readings from Last 3 Encounters:  01/23/20 115/60  01/18/20 120/60  01/11/20 130/70    Assessment: Review of patient past medical history, allergies, medications, health status, including review of consultants reports, laboratory and other test data, was performed as part of comprehensive evaluation and provision of chronic care management services.   SDOH:  (Social Determinants of Health) assessments and interventions performed:  SDOH Interventions   Flowsheet Row Most Recent Value  SDOH Interventions   Financial Strain Interventions Other (Comment)  [manufacturer assistance]      CCM Care Plan  No Known Allergies  Medications Reviewed Today    Reviewed by [name], RPH-CPP (Pharmacist) on 03/08/20 at 1124  Med List Status: <None>  Medication Order Taking? Sig Documenting Provider Last Dose Status Informant  atorvastatin (LIPITOR) 40 MG tablet [id number] Yes TAKE 1 TABLET BY MOUTH DAILY Theora Gianotti, NP Taking Active   carvedilol (COREG) 6.25 MG tablet [id number] Yes TAKE 1 TABLET BY MOUTH TWICE DAILY WITH A MEAL [name], MD Taking Active   CINNAMON PO [id number] Yes Take 1,000 mg by mouth in the morning and at bedtime. [provider] Taking Active Family Member  clopidogrel (PLAVIX) 75 MG tablet [id number] Yes TAKE ONE TABLET EVERY MORNING WITH BREAKFAST [name], NP Taking Active   dapagliflozin propanediol ([name]) 10 MG TABS tablet [id number] Yes Take 1 tablet (10 mg total) by mouth daily before breakfast. [name], MD Taking Active   Dulaglutide ([name]) 3 QI/2.9NL SOPN [id number] Yes Inject 3 mg as directed once a week. [name], MD Taking Active   ferrous sulfate 325 (65 FE) MG tablet [id number] Yes Take 325 mg by mouth daily with breakfast. [provider] Taking Active   furosemide (LASIX) 40 MG tablet [id number] Yes Take 1 tablet (40 mg total) by mouth daily. [name], NP Taking Active   gabapentin (NEURONTIN) 300 MG capsule [id number] Yes TAKE 1 CAPSULE BY MOUTH 3 TIMES DAILY [name], MD Taking Active   hydrALAZINE (APRESOLINE) 25 MG tablet [id number] Yes TAKE ONE TABLET EACH MORNING AND AT BEDTIME [name], MD  Taking Active   Multiple Vitamin (MULTIVITAMIN WITH MINERALS) TABS [id number] Yes Take 1 tablet by mouth daily. Multivitamin for Men 50+ [provider] Taking Active Family Member  nitroGLYCERIN (NITROSTAT) 0.4 MG SL tablet [id number]  Place 1 tablet (0.4 mg total) under the tongue every 5 (five) minutes as needed for chest pain.  Patient not taking: Reported on 01/25/2020   [name], MD  Active Family Member   pantoprazole (PROTONIX) 40 MG tablet [id number] Yes TAKE ONE TABLET TWICE DAILY [name], NP Taking Active   sacubitril-valsartan (ENTRESTO) 24-26 MG [id number] Yes Take 1 tablet by mouth 2 (two) times daily. [name], NP Taking Active Family Member          Patient Active Problem List   Diagnosis Date Noted  . Cough 01/11/2020  . Left knee pain 01/11/2020  . Abnormal cardiovascular stress test   . Acute on chronic systolic heart failure ([place]) 07/30/2019  . AK (actinic keratosis) 04/03/2019  . Colon cancer screening   . Rectal polyp   . Polyp of transverse colon   . Liver lesion 12/06/2018  . Chronic kidney disease (CKD), stage III (moderate) ([place]) 12/06/2018  . Abrasion 12/06/2018  . History of anemia 03/30/2018  . Hyperpigmented skin lesion 12/20/2017  . Bilateral foot pain 12/20/2017  . DDD (degenerative disc disease), lumbar 08/18/2017  . SCC (squamous cell carcinoma), trunk 05/17/2017  . Venous insufficiency 02/12/2017  . Decreased pedal pulses 02/12/2017  . Skin lesion 02/12/2017  . Neuropathy 01/09/2016  . Elevated LFTs 06/28/2015  . History of penile cancer 06/28/2015  . Peptic ulcer disease 09/12/2014  . Erectile dysfunction 09/07/2013  . Chronic systolic heart failure ([place]) 05/15/2013  . Hyperlipidemia 05/15/2013  . DM type 2 (diabetes mellitus, type 2) ([place]) 04/07/2013  . Other and unspecified hyperlipidemia 04/07/2013  . Morbid obesity ([place]) 04/07/2013  . HTN (hypertension) 04/07/2013  . CAD (coronary artery disease) 04/04/2013    Conditions to be addressed/monitored: CHF, CAD, HTN, HLD, DMII and CKD  Care Plan : Medication Management  Updates made by [name], RPH-CPP since 03/08/2020 12:00 AM    Problem: Diabetes, CHF, HTN     Long-Range Goal: Disease Progression Prevention   This Visit's Progress: On track  Recent Progress: On track  Priority: High  Note:   Current Barriers:  . Unable to independently afford treatment  regimen . Unable to achieve control of diabetes  . Complex patient with multiple comorbidities including HFrEF, CAD, HLD  Pharmacist Clinical Goal(s):  Marland Kitchen Over the next 90 days, patient will verbalize ability to afford treatment regimen. . Over the next 90 days, patient will achieve adherence to monitoring guidelines and medication adherence to achieve therapeutic efficacy. . Over the next 90 days, patient will achieve control of diabetes as evidenced by improvement in A1c  Interventions: . 1:1 collaboration with [name], MD regarding development and update of comprehensive plan of care as evidenced by provider attestation and co-signature . Inter-disciplinary care team collaboration (see longitudinal plan of care) . Comprehensive medication review performed; medication list updated in electronic medical record  Health Maintenance: . Extensive discussion of COVID vaccination, recommendation, benefits. Patient notes he has avoided getting yet because of "the politics"; he doesn't like "being told what to do by the government". Encouraged to focus on individual health benefits and strongly consider vaccination. Discussed to talk with [facility] pharmacy team about scheduling appointment when he decides he is interested. He notes he will consider and probably  pursue vaccination in the future  Diabetes: . Uncontrolled but improved; current treatment: [name] 3 mg weekly; [name] 10 mg daily o Metformin d/c d/t renal function  . Current glucose readings:   Fasting After Supper  14-Jan 137 189  15-Jan 215 159  16-Jan 124 144  17-Jan 144 167  18-Jan 123 168  19-Jan 159 189  20-Jan 151 190  21-Jan 143 159  22-Jan 140 157  23-Jan 134 180  24-Jan 175 126  25-Jan 152 214  26-Jan 148 149  27-Jan 127 127  28-Jan 173    150 166   . Current dietary patterns: Breakfast: 1-2 eggs, hash brown once in a while, decaf coffee, Lunch: 1-2 vegetables, sometimes sandwich, Supper: 1/2  sandwich, fish sandwich . Praised for improvement in glucose control. Recommend to continue current regimen at this time. Last A1c reflected prior months with worsening control  . Reviewed 2022 approval for [name] and [name] assistance. Patient's daughter, [name], handles refills   HFrEF, (most recent EF 30-35%) . Appropriately managed; current treatment: furosemide 40 mg daily, carvedilol 6.25 mg BID, Entresto 24/26 mg BID; hydralazine 25 mg BID, follows w/ Dr. Fletcher Anon.  . Appointment with Dr. Quentin Ore next week to discuss potential for ICD placement. Patient notes today that he is unsure if he is interested.  o Spironolactone held d/t renal fx; Marland Kitchen Home BP readings:  SBP AM  DBP AM HR AM SBP PM DBP PM HR PM  14-Jan 134 72 77     15-Jan 138 78 72 128 69 71  16-Jan 138 71 76     17-Jan 122 62 65     18-Jan 140 75 72     19-Jan 130 69 72     20-Jan 143 77 76 103 55 72  21-Jan 138 65 71     22-Jan 154 75 67     23-Jan 136 71 75 148 71 74  24-Jan 147 72 76 112 64 84  25-Jan 127 69 77     26-Jan 149 72 67 107 69 70  27-Jan        28-Jan 127 80 73      137 72 73 120 66 74   . Reviewed approval for Entresto assistance for 2022.  Marland Kitchen Reviewed importance of minimizing sodium intake.  . Encouraged continued collaboration with cardiology and EP.  Hyperlipidemia, secondary ASCVD prevention (hx MI w/ RCA stenting) . Controlled; current treatment: atorvastatin 40 mg daily . Antiplatelet regimen: clopidogrel 75 mg daily . Recommended to continue current regimen at this time  GERD: . Appropriately managed and controlled per patient report; current treatment: pantoprazole 40 mg BID . Continue current regimen.  Patient Goals/Self-Care Activities . Over the next 90 days, patient will:  - take medications as prescribed check blood glucose BID, document, and provide at future appointments check blood pressure BID, document, and provide at future appointments collaborate with provider on  medication access solutions  Follow Up Plan: Telephone follow up appointment with care management team member scheduled for: ~  12 weeks      Medication Assistance: Wilder Glade obtained through [name] medication assistance program.  Enrollment ends 02/08/21. [name] obtained through [employer] medication assistance program.  Enrollment ends 02/08/21. Entresto obtained through [name] medication assistance program.  Enrollment ends 02/08/21  Follow Up:  Patient agrees to Care Plan and Follow-up.  Plan: Telephone follow up appointment with care management team member scheduled for:  ~ 36 weeks  [name], [name], Jessie, CPP Clinical  Probation officer at [place]

## 2020-03-15 ENCOUNTER — Encounter: Payer: Self-pay | Admitting: Cardiology

## 2020-03-15 ENCOUNTER — Ambulatory Visit: Payer: PPO | Admitting: Cardiology

## 2020-03-15 ENCOUNTER — Encounter: Payer: Self-pay | Admitting: *Deleted

## 2020-03-15 ENCOUNTER — Other Ambulatory Visit: Payer: Self-pay

## 2020-03-15 VITALS — BP 108/66 | HR 69 | Ht 70.0 in | Wt 240.0 lb

## 2020-03-15 DIAGNOSIS — I10 Essential (primary) hypertension: Secondary | ICD-10-CM | POA: Diagnosis not present

## 2020-03-15 DIAGNOSIS — I2510 Atherosclerotic heart disease of native coronary artery without angina pectoris: Secondary | ICD-10-CM | POA: Diagnosis not present

## 2020-03-15 DIAGNOSIS — I5042 Chronic combined systolic (congestive) and diastolic (congestive) heart failure: Secondary | ICD-10-CM | POA: Diagnosis not present

## 2020-03-15 DIAGNOSIS — E1122 Type 2 diabetes mellitus with diabetic chronic kidney disease: Secondary | ICD-10-CM

## 2020-03-15 DIAGNOSIS — N182 Chronic kidney disease, stage 2 (mild): Secondary | ICD-10-CM | POA: Diagnosis not present

## 2020-03-15 DIAGNOSIS — Z01812 Encounter for preprocedural laboratory examination: Secondary | ICD-10-CM | POA: Diagnosis not present

## 2020-03-15 NOTE — Progress Notes (Addendum)
Electrophysiology Office Note:    Date:  03/15/2020   ID:  [name], DOB 04-15-1950, MRN [id number]  PCP:  [name], MD  [facility] Cardiologist:  [name], MD  [facility] Electrophysiologist:  Vickie Epley, MD   Referring MD: [name], MD   Chief Complaint: Ischemic cardiomyopathy  History of Present Illness:    Devin Hanna is a 70 y.o. male who presents for an evaluation of ischemic cardiomyopathy at the request of Dr. Fletcher Anon. Their medical history includes coronary artery disease with prior PCI after a 2015 inferior STEMI, diabetes, hypertension, left bundle branch block, morbid obesity.  He last saw Dr. Fletcher Anon January 18, 2020.  At that appointment NYHA class II-III symptoms were documented.  He had a recent echocardiogram on February 20, 2020 which shows an ejection fraction of 30%.  There is mildly decreased right ventricular function no significant valvular abnormalities.  Today he is with his daughter in clinic.  He is retired.  Previously worked with heavy machinery.  He describes NYHA class II-III symptoms during his exam today.  Past Medical History:  Diagnosis Date  . Acute respiratory failure with hypoxia ([place]) 06/2016  . CAD (coronary artery disease)    a. Remote PCI of the RCA;  b. 03/2013 Inf STEMI/PCI: LAD 60-70d, RCA 99p ISR(rota/CBA/DES);  c. 09/2014 MV: inf and infsept infarct, EF 35-40%;  d. 09/2014 Cath: patent RCA stent, stable LAD dzs, EF 40%-->Med rx.  . CHF (congestive heart failure) ([place])   . Diabetes mellitus    sees Dr. Hardin Negus in [place]  . GERD (gastroesophageal reflux disease)   . HFrEF (heart failure with reduced ejection fraction) ([place])   . Hyperlipidemia   . Hypertension   . Ischemic cardiomyopathy    a. 09/2014 EF 40% by V gram;  b. 06/2016 Echo: EF 40, diff HK, Gr1 DD, inf HK, no mural thrombus. Mild MR, mildly dil LA, mildly dil RV.   . LBBB (left bundle branch block)    Old  . Liver mass   . Morbid  obesity ([place])   . Peripheral neuropathy   . Upper GI bleed   . Vertigo     Past Surgical History:  Procedure Laterality Date  . CARDIAC CATHETERIZATION N/A 10/03/2014   Procedure: Left Heart Cath and Coronary Angiography;  Surgeon: [name], MD;  Location: [place] CV LAB;  Service: Cardiovascular;  Laterality: N/A;  . CIRCUMCISION N/A 07/22/2015   Procedure: CIRCUMCISION ADULT;  Surgeon: Hollice Espy, MD;  Location: [facility] ORS;  Service: Urology;  Laterality: N/A;  . COLONOSCOPY WITH PROPOFOL N/A 01/12/2019   Procedure: COLONOSCOPY WITH PROPOFOL;  Surgeon: Lucilla Lame, MD;  Location: [facility] ENDOSCOPY;  Service: Endoscopy;  Laterality: N/A;  . CORONARY STENT PLACEMENT    . ESOPHAGOGASTRODUODENOSCOPY (EGD) WITH PROPOFOL N/A 09/12/2014   Procedure: ESOPHAGOGASTRODUODENOSCOPY (EGD) WITH PROPOFOL;  Surgeon: Ronald Lobo, MD;  Location: [facility] ENDOSCOPY;  Service: Endoscopy;  Laterality: N/A;  . EXTERNAL FIXATION LEG Left 04/03/2012   Procedure: EXTERNAL FIXATION LEG;  Surgeon: Wylene Simmer, MD;  Location: [place];  Service: Orthopedics;  Laterality: Left;  . EXTERNAL FIXATION REMOVAL Left 04/14/2012   Procedure: REMOVAL EXTERNAL FIXATION LEG;  Surgeon: Wylene Simmer, MD;  Location: [name];  Service: Orthopedics;  Laterality: Left;  . IRRIGATION AND DEBRIDEMENT KNEE Left 04/03/2012   Procedure: IRRIGATION AND DEBRIDEMENT KNEE;  Surgeon: Wylene Simmer, MD;  Location: [place];  Service: Orthopedics;  Laterality: Left;  . LEFT HEART CATHETERIZATION  WITH CORONARY ANGIOGRAM N/A 04/05/2013   Procedure: LEFT HEART CATHETERIZATION WITH CORONARY ANGIOGRAM;  Surgeon: [name], MD;  Location: [place] CATH LAB;  Service: Cardiovascular;  Laterality: N/A;  . MASS EXCISION N/A 07/22/2015   Procedure: EXCISION PENILE MASS;  Surgeon: Hollice Espy, MD;  Location: [facility] ORS;  Service: Urology;  Laterality: N/A;  . ORIF TIBIA PLATEAU Left 04/14/2012   Procedure: OPEN REDUCTION INTERNAL FIXATION (ORIF) TIBIAL PLATEAU;   Surgeon: Wylene Simmer, MD;  Location: [place];  Service: Orthopedics;  Laterality: Left;  . PERCUTANEOUS CORONARY ROTOBLATOR INTERVENTION (PCI-R) N/A 04/06/2013   Procedure: PERCUTANEOUS CORONARY ROTOBLATOR INTERVENTION (PCI-R);  Surgeon: [name], MD;  Location[facility] CATH LAB;  Service: Cardiovascular;  Laterality: N/A;  . RIGHT/LEFT HEART CATH AND CORONARY ANGIOGRAPHY N/A 08/21/2019   Procedure: RIGHT/LEFT HEART CATH AND CORONARY ANGIOGRAPHY;  Surgeon: [name], MD;  Location: [place] CV LAB;  Service: Cardiovascular;  Laterality: N/A;    Current Medications: Current Meds  Medication Sig  . atorvastatin (LIPITOR) 40 MG tablet TAKE 1 TABLET BY MOUTH DAILY  . carvedilol (COREG) 6.25 MG tablet TAKE 1 TABLET BY MOUTH TWICE DAILY WITH A MEAL  . CINNAMON PO Take 1,000 mg by mouth in the morning and at bedtime.  . clopidogrel (PLAVIX) 75 MG tablet TAKE ONE TABLET EVERY MORNING WITH BREAKFAST  . dapagliflozin propanediol ([name]) 10 MG TABS tablet Take 1 tablet (10 mg total) by mouth daily before breakfast.  . Dulaglutide ([name]) 3 FU/9.3AT SOPN Inject 3 mg as directed once a week.  . ferrous sulfate 325 (65 FE) MG tablet Take 325 mg by mouth daily with breakfast.  . furosemide (LASIX) 40 MG tablet Take 1 tablet (40 mg total) by mouth daily.  Marland Kitchen gabapentin (NEURONTIN) 300 MG capsule TAKE 1 CAPSULE BY MOUTH 3 TIMES DAILY  . hydrALAZINE (APRESOLINE) 25 MG tablet TAKE ONE TABLET EACH MORNING AND AT BEDTIME  . Multiple Vitamin (MULTIVITAMIN WITH MINERALS) TABS Take 1 tablet by mouth daily. Multivitamin for Men 23+  . nitroGLYCERIN (NITROSTAT) 0.4 MG SL tablet Place 1 tablet (0.4 mg total) under the tongue every 5 (five) minutes as needed for chest pain.  . pantoprazole (PROTONIX) 40 MG tablet TAKE ONE TABLET TWICE DAILY  . sacubitril-valsartan (ENTRESTO) 24-26 MG Take 1 tablet by mouth 2 (two) times daily.     Allergies:   Patient has no known allergies.   Social History    Socioeconomic History  . Marital status: Divorced    Spouse name: Not on file  . Number of children: Not on file  . Years of education: Not on file  . Highest education level: Not on file  Occupational History  . Occupation: heavy Company secretary    Employer: [employer]  Tobacco Use  . Smoking status: Former Smoker    Packs/day: 3.00    Years: 40.00    Pack years: 120.00    Types: Cigarettes    Quit date: 04/05/1995    Years since quitting: 24.9  . Smokeless tobacco: Former Systems developer  . Tobacco comment: started smoking at age 49  Vaping Use  . Vaping Use: Never used  Substance and Sexual Activity  . Alcohol use: No    Comment: Occassional Use  . Drug use: No  . Sexual activity: Not on file  Other Topics Concern  . Not on file  Social History Narrative   Heavy Company secretary.; Divorced; quit smoking 25 years; beer rare; by self. Still riding motor cycles.  Social Determinants of Health   Financial Resource Strain: Medium Risk  . Difficulty of Paying Living Expenses: Somewhat hard  Food Insecurity: Not on file  Transportation Needs: Not on file  Physical Activity: Not on file  Stress: Not on file  Social Connections: Not on file     Family History: The patient's family history includes Breast cancer in his sister; Coronary artery disease in his brother and father; Lung cancer in his brother; Prostate cancer in his brother; Stroke in his sister.  ROS:   Please see the history of present illness.    All other systems reviewed and are negative.  EKGs/Labs/Other Studies Reviewed:    The following studies were reviewed today:  February 20, 2020 echo personally reviewed Left ventricular function moderately decreased, 30% Right ventricular function mildly decreased No significant valvular abnormalities  January 22, 2020 EKG personally reviewed Left bundle branch block Sinus rhythm Inferior Q waves      EKG:  The ekg ordered today demonstrates  sinus rhythm, left bundle branch block.  Poor R wave progression  Recent Labs: 09/07/2019: BNP 144.7 01/22/2020: ALT 45; BUN 17; Creatinine, Ser 1.32; Hemoglobin 13.7; Platelets 184; Potassium 4.1; Sodium 136  Recent Lipid Panel    Component Value Date/Time   CHOL 92 02/14/2020 0812   CHOL 103 06/07/2014 0824   TRIG 190.0 (H) 02/14/2020 0812   HDL 31.00 (L) 02/14/2020 0812   HDL 38 (L) 06/07/2014 0824   CHOLHDL 3 02/14/2020 0812   VLDL 38.0 02/14/2020 0812   LDLCALC 23 02/14/2020 0812   LDLCALC 59 03/30/2018 0930    Physical Exam:    VS:  BP 108/66   Pulse 69   Ht 5' 10"  (1.778 m)   Wt 240 lb (108.9 kg)   BMI 34.44 kg/m     Wt Readings from Last 3 Encounters:  03/15/20 240 lb (108.9 kg)  01/23/20 244 lb (110.7 kg)  01/18/20 244 lb 8 oz (110.9 kg)     GEN:  Well nourished, well developed in no acute distress.  Obese HEENT: Normal NECK: No JVD; No carotid bruits LYMPHATICS: No lymphadenopathy CARDIAC: RRR, no murmurs, rubs, gallops RESPIRATORY:  Clear to auscultation without rales, wheezing or rhonchi  ABDOMEN: Soft, non-tender, non-distended MUSCULOSKELETAL:  No edema; No deformity  SKIN: Warm and dry NEUROLOGIC:  Alert and oriented x 3 PSYCHIATRIC:  Normal affect   ASSESSMENT:    1. Chronic combined systolic and diastolic CHF (congestive heart failure) ([place])   2. Coronary artery disease involving native coronary artery of native heart without angina pectoris   3. Type 2 diabetes mellitus with stage 2 chronic kidney disease, without long-term current use of insulin ([place])   4. Essential hypertension    PLAN:    In order of problems listed above:  1. Chronic combined systolic and diastolic heart failure NYHA class II-III symptoms.  Persistently reduced ejection fraction at 30%.  Left bundle branch block. We discussed CRT-D in detail during today's visit including his indications for the therapy, risks, expected recovery time, expected efficacy.  He wishes to  proceed with scheduling.  The patient has an ischemic CM (EF 30%), NYHA Class III CHF, and CAD.  He is referred by Dr Fletcher Anon for risk stratification of sudden death and consideration of ICD implantation.  At this time, he meets criteria for ICD implantation for primary prevention of sudden death.  I have had a thorough discussion with the patient reviewing options.  The patient and their family (if available)  have had opportunities to ask questions and have them answered. The patient and I have decided together through a shared decision making process to implant a CRTD at this time.   Risks, benefits, alternatives to ICD implantation were discussed in detail with the patient today. The patient understands that the risks include but are not limited to bleeding, infection, pneumothorax, perforation, tamponade, vascular damage, renal failure, MI, stroke, death, inappropriate shocks, and lead dislodgement and wishes to proceed.  We will therefore schedule device implantation at the next available time.  Plan for StJ CRT-D.  2.  Coronary artery disease No ischemic symptoms during today's exam.  Hold Plavix for 2 days prior to the implant.  3.  Hypertension Controlled during today's visit.  Continue current medicines.   Medication Adjustments/Labs and Tests Ordered: Current medicines are reviewed at length with the patient today.  Concerns regarding medicines are outlined above.  No orders of the defined types were placed in this encounter.  No orders of the defined types were placed in this encounter.    Signed, Lars Mage, MD, [facility]  03/15/2020 10:10 AM    Electrophysiology [facility]    ICD Criteria  Current LVEF:33%. Within 12 months prior to implant: Yes   Heart failure history: Yes, Class III  Cardiomyopathy history: Yes, Ischemic Cardiomyopathy - Prior MI.  Atrial Fibrillation/Atrial Flutter: No.  Ventricular tachycardia history: No.  Cardiac  arrest history: No.  History of syndromes with risk of sudden death: No.  Previous ICD: No.  Current ICD indication: Primary  PPM indication: Yes. Pacing type: Ventricular. Greater than 40% RV pacing requirement anticipated. Indication: Chronotropic incompetence  Class I or II Bradycardia indication present: No  Beta Blocker therapy for 3 or more months: Yes, prescribed.   Ace Inhibitor/ARB therapy for 3 or more months: Yes, prescribed.    I have seen Devin Hanna is a 69 y.o. malepre-procedural and has been referred by Dr Audelia Acton for consideration of ICD implant for primary prevention of sudden death.  The patient's chart has been reviewed and they meet criteria for ICD implant.  I have had a thorough discussion with the patient reviewing options.  The patient and their family (if available) have had opportunities to ask questions and have them answered. The patient and I have decided together through the [place] Support Tool to implant ICD at this time.  Risks, benefits, alternatives to ICD implantation were discussed in detail with the patient today. The patient  understands that the risks include but are not limited to bleeding, infection, pneumothorax, perforation, tamponade, vascular damage, renal failure, MI, stroke, death, inappropriate shocks, and lead dislodgement and  wishes to proceed.

## 2020-03-15 NOTE — H&P (View-Only) (Signed)
Electrophysiology Office Note:    Date:  03/15/2020   ID:  Devin Hanna, DOB [birth year], MRN [id number]  PCP:  [name], MD  [facility] Cardiologist:  [name], MD  [facility] Electrophysiologist:  Vickie Epley, MD   Referring MD: [name], MD   Chief Complaint: Ischemic cardiomyopathy  History of Present Illness:    Devin Hanna is a 70 y.o. male who presents for an evaluation of ischemic cardiomyopathy at the request of Dr. Fletcher Anon. Their medical history includes coronary artery disease with prior PCI after a 2015 inferior STEMI, diabetes, hypertension, left bundle branch block, morbid obesity.  He last saw Dr. Fletcher Anon January 18, 2020.  At that appointment NYHA class II-III symptoms were documented.  He had a recent echocardiogram on February 20, 2020 which shows an ejection fraction of 30%.  There is mildly decreased right ventricular function no significant valvular abnormalities.  Today he is with his daughter in clinic.  He is retired.  Previously worked with heavy machinery.  He describes NYHA class II-III symptoms during his exam today.  Past Medical History:  Diagnosis Date  . Acute respiratory failure with hypoxia ([place]) 06/2016  . CAD (coronary artery disease)    a. Remote PCI of the RCA;  b. 03/2013 Inf STEMI/PCI: LAD 60-70d, RCA 99p ISR(rota/CBA/DES);  c. 09/2014 MV: inf and infsept infarct, EF 35-40%;  d. 09/2014 Cath: patent RCA stent, stable LAD dzs, EF 40%-->Med rx.  . CHF (congestive heart failure) ([place])   . Diabetes mellitus    sees Dr. Hardin Negus in [place]  . GERD (gastroesophageal reflux disease)   . HFrEF (heart failure with reduced ejection fraction) ([place])   . Hyperlipidemia   . Hypertension   . Ischemic cardiomyopathy    a. 09/2014 EF 40% by V gram;  b. 06/2016 Echo: EF 40, diff HK, Gr1 DD, inf HK, no mural thrombus. Mild MR, mildly dil LA, mildly dil RV.   . LBBB (left bundle branch block)    Old  . Liver mass   . Morbid  obesity ([place])   . Peripheral neuropathy   . Upper GI bleed   . Vertigo     Past Surgical History:  Procedure Laterality Date  . CARDIAC CATHETERIZATION N/A 10/03/2014   Procedure: Left Heart Cath and Coronary Angiography;  Surgeon: [name], MD;  Location: [facility];  Service: Cardiovascular;  Laterality: N/A;  . CIRCUMCISION N/A 07/22/2015   Procedure: CIRCUMCISION ADULT;  Surgeon: Hollice Espy, MD;  Location: [facility] ORS;  Service: Urology;  Laterality: N/A;  . COLONOSCOPY WITH PROPOFOL N/A 01/12/2019   Procedure: COLONOSCOPY WITH PROPOFOL;  Surgeon: Lucilla Lame, MD;  Location: [facility] ENDOSCOPY;  Service: Endoscopy;  Laterality: N/A;  . CORONARY STENT PLACEMENT    . ESOPHAGOGASTRODUODENOSCOPY (EGD) WITH PROPOFOL N/A 09/12/2014   Procedure: ESOPHAGOGASTRODUODENOSCOPY (EGD) WITH PROPOFOL;  Surgeon: Ronald Lobo, MD;  Location: [facility] ENDOSCOPY;  Service: Endoscopy;  Laterality: N/A;  . EXTERNAL FIXATION LEG Left 04/03/2012   Procedure: EXTERNAL FIXATION LEG;  Surgeon: Wylene Simmer, MD;  Location: [place];  Service: Orthopedics;  Laterality: Left;  . EXTERNAL FIXATION REMOVAL Left 04/14/2012   Procedure: REMOVAL EXTERNAL FIXATION LEG;  Surgeon: Wylene Simmer, MD;  Location: [place];  Service: Orthopedics;  Laterality: Left;  . IRRIGATION AND DEBRIDEMENT KNEE Left 04/03/2012   Procedure: IRRIGATION AND DEBRIDEMENT KNEE;  Surgeon: Wylene Simmer, MD;  Location: [place];  Service: Orthopedics;  Laterality: Left;  . LEFT HEART CATHETERIZATION  WITH CORONARY ANGIOGRAM N/A 04/05/2013   Procedure: LEFT HEART CATHETERIZATION WITH CORONARY ANGIOGRAM;  Surgeon: [name], MD;  Location: [place] [facility];  Service: Cardiovascular;  Laterality: N/A;  . MASS EXCISION N/A 07/22/2015   Procedure: EXCISION PENILE MASS;  Surgeon: Hollice Espy, MD;  Location: [facility] ORS;  Service: Urology;  Laterality: N/A;  . ORIF TIBIA PLATEAU Left 04/14/2012   Procedure: OPEN REDUCTION INTERNAL FIXATION (ORIF) TIBIAL PLATEAU;   Surgeon: Wylene Simmer, MD;  Location: [place];  Service: Orthopedics;  Laterality: Left;  . PERCUTANEOUS CORONARY ROTOBLATOR INTERVENTION (PCI-R) N/A 04/06/2013   Procedure: PERCUTANEOUS CORONARY ROTOBLATOR INTERVENTION (PCI-R);  Surgeon: [name], MD;  Location: [facility] CATH LAB;  Service: Cardiovascular;  Laterality: N/A;  . RIGHT/LEFT HEART CATH AND CORONARY ANGIOGRAPHY N/A 08/21/2019   Procedure: RIGHT/LEFT HEART CATH AND CORONARY ANGIOGRAPHY;  Surgeon: [name], MD;  Location: [place] CV [facility];  Service: Cardiovascular;  Laterality: N/A;    Current Medications: Current Meds  Medication Sig  . atorvastatin (LIPITOR) 40 MG tablet TAKE 1 TABLET BY MOUTH DAILY  . carvedilol (COREG) 6.25 MG tablet TAKE 1 TABLET BY MOUTH TWICE DAILY WITH A MEAL  . CINNAMON PO Take 1,000 mg by mouth in the morning and at bedtime.  . clopidogrel (PLAVIX) 75 MG tablet TAKE ONE TABLET EVERY MORNING WITH BREAKFAST  . dapagliflozin propanediol ([name]) 10 MG TABS tablet Take 1 tablet (10 mg total) by mouth daily before breakfast.  . Dulaglutide ([name]) 3 BD/5.3GD SOPN Inject 3 mg as directed once a week.  . ferrous sulfate 325 (65 FE) MG tablet Take 325 mg by mouth daily with breakfast.  . furosemide (LASIX) 40 MG tablet Take 1 tablet (40 mg total) by mouth daily.  Marland Kitchen gabapentin (NEURONTIN) 300 MG capsule TAKE 1 CAPSULE BY MOUTH 3 TIMES DAILY  . hydrALAZINE (APRESOLINE) 25 MG tablet TAKE ONE TABLET EACH MORNING AND AT BEDTIME  . Multiple Vitamin (MULTIVITAMIN WITH MINERALS) TABS Take 1 tablet by mouth daily. Multivitamin for Men 43+  . nitroGLYCERIN (NITROSTAT) 0.4 MG SL tablet Place 1 tablet (0.4 mg total) under the tongue every 5 (five) minutes as needed for chest pain.  . pantoprazole (PROTONIX) 40 MG tablet TAKE ONE TABLET TWICE DAILY  . sacubitril-valsartan (ENTRESTO) 24-26 MG Take 1 tablet by mouth 2 (two) times daily.     Allergies:   Patient has no known allergies.   Social History    Socioeconomic History  . Marital status: Divorced    Spouse name: Not on file  . Number of children: Not on file  . Years of education: Not on file  . Highest education level: Not on file  Occupational History  . Occupation: heavy Company secretary    Employer: [employer]  Tobacco Use  . Smoking status: Former Smoker    Packs/day: 3.00    Years: 40.00    Pack years: 120.00    Types: Cigarettes    Quit date: 04/05/1995    Years since quitting: 24.9  . Smokeless tobacco: Former Systems developer  . Tobacco comment: started smoking at age 24  Vaping Use  . Vaping Use: Never used  Substance and Sexual Activity  . Alcohol use: No    Comment: Occassional Use  . Drug use: No  . Sexual activity: Not on file  Other Topics Concern  . Not on file  Social History Narrative   Heavy Company secretary.; Divorced; quit smoking 25 years; beer rare; by self. Still riding motor cycles.  Social Determinants of Health   Financial Resource Strain: Medium Risk  . Difficulty of Paying Living Expenses: Somewhat hard  Food Insecurity: Not on file  Transportation Needs: Not on file  Physical Activity: Not on file  Stress: Not on file  Social Connections: Not on file     Family History: The patient's family history includes Breast cancer in his sister; Coronary artery disease in his brother and father; Lung cancer in his brother; Prostate cancer in his brother; Stroke in his sister.  ROS:   Please see the history of present illness.    All other systems reviewed and are negative.  EKGs/Labs/Other Studies Reviewed:    The following studies were reviewed today:  February 20, 2020 echo personally reviewed Left ventricular function moderately decreased, 30% Right ventricular function mildly decreased No significant valvular abnormalities  January 22, 2020 EKG personally reviewed Left bundle branch block Sinus rhythm Inferior Q waves      EKG:  The ekg ordered today demonstrates  sinus rhythm, left bundle branch block.  Poor R wave progression  Recent Labs: 09/07/2019: BNP 144.7 01/22/2020: ALT 45; BUN 17; Creatinine, Ser 1.32; Hemoglobin 13.7; Platelets 184; Potassium 4.1; Sodium 136  Recent Lipid Panel    Component Value Date/Time   CHOL 92 02/14/2020 0812   CHOL 103 06/07/2014 0824   TRIG 190.0 (H) 02/14/2020 0812   HDL 31.00 (L) 02/14/2020 0812   HDL 38 (L) 06/07/2014 0824   CHOLHDL 3 02/14/2020 0812   VLDL 38.0 02/14/2020 0812   LDLCALC 23 02/14/2020 0812   LDLCALC 59 03/30/2018 0930    Physical Exam:    VS:  BP 108/66   Pulse 69   Ht 5' 10"  (1.778 m)   Wt 240 lb (108.9 kg)   BMI 34.44 kg/m     Wt Readings from Last 3 Encounters:  03/15/20 240 lb (108.9 kg)  01/23/20 244 lb (110.7 kg)  01/18/20 244 lb 8 oz (110.9 kg)     GEN:  Well nourished, well developed in no acute distress.  Obese HEENT: Normal NECK: No JVD; No carotid bruits LYMPHATICS: No lymphadenopathy CARDIAC: RRR, no murmurs, rubs, gallops RESPIRATORY:  Clear to auscultation without rales, wheezing or rhonchi  ABDOMEN: Soft, non-tender, non-distended MUSCULOSKELETAL:  No edema; No deformity  SKIN: Warm and dry NEUROLOGIC:  Alert and oriented x 3 PSYCHIATRIC:  Normal affect   ASSESSMENT:    1. Chronic combined systolic and diastolic CHF (congestive heart failure) ([place])   2. Coronary artery disease involving native coronary artery of native heart without angina pectoris   3. Type 2 diabetes mellitus with stage 2 chronic kidney disease, without long-term current use of insulin ([place])   4. Essential hypertension    PLAN:    In order of problems listed above:  1. Chronic combined systolic and diastolic heart failure NYHA class II-III symptoms.  Persistently reduced ejection fraction at 30%.  Left bundle branch block. We discussed CRT-D in detail during today's visit including his indications for the therapy, risks, expected recovery time, expected efficacy.  He wishes to  proceed with scheduling.  The patient has an ischemic CM (EF 30%), NYHA Class III CHF, and CAD.  He is referred by Dr Fletcher Anon for risk stratification of sudden death and consideration of ICD implantation.  At this time, he meets criteria for ICD implantation for primary prevention of sudden death.  I have had a thorough discussion with the patient reviewing options.  The patient and their family (if available)  have had opportunities to ask questions and have them answered. The patient and I have decided together through a shared decision making process to implant a CRTD at this time.   Risks, benefits, alternatives to ICD implantation were discussed in detail with the patient today. The patient understands that the risks include but are not limited to bleeding, infection, pneumothorax, perforation, tamponade, vascular damage, renal failure, MI, stroke, death, inappropriate shocks, and lead dislodgement and wishes to proceed.  We will therefore schedule device implantation at the next available time.  Plan for StJ CRT-D.  2.  Coronary artery disease No ischemic symptoms during today's exam.  Hold Plavix for 2 days prior to the implant.  3.  Hypertension Controlled during today's visit.  Continue current medicines.   Medication Adjustments/Labs and Tests Ordered: Current medicines are reviewed at length with the patient today.  Concerns regarding medicines are outlined above.  No orders of the defined types were placed in this encounter.  No orders of the defined types were placed in this encounter.    Signed, Lars Mage, MD, [facility]  03/15/2020 10:10 AM    Electrophysiology [facility]

## 2020-03-15 NOTE — Patient Instructions (Addendum)
Medication Instructions:  - Your physician recommends that you continue on your current medications as directed. Please refer to the Current Medication list given to you today.  *If you need a refill on your cardiac medications before your next appointment, please call your pharmacy*   Lab Work: - Pre procedure lab work: Thursday 04/04/20 (7:30 am- 12:30 pm) [facility] En[facility] at [facility] 1st desk on the right to check in, past the screening table Lab hours: Monday- Friday (7:30 am- 5:30 pm)   - Pre procedure COVID swab:Thursday 04/04/20 (8:00 am- 1:00 pm) [facility] Drive up test only- staff will come out to the car to swab you  If you have labs (blood work) drawn today and your tests are completely normal, you will receive your results only by: Marland Kitchen MyChart Message (if you have MyChart) OR . A paper copy in the mail If you have any lab test that is abnormal or we need to change your treatment, we will call you to review the results.   Testing/Procedures: - Your physician has recommended that you have a defibrillator inserted. An implantable cardioverter defibrillator (ICD) is a small device that is placed in your chest or, in rare cases, your abdomen. This device uses electrical pulses or shocks to help control life-threatening, irregular heartbeats that could lead the heart to suddenly stop beating (sudden cardiac arrest). Leads are attached to the ICD that goes into your heart. This is done in the hospital and usually requires an overnight stay. - see attached instruction letter   Follow-Up: At [facility], you and your health needs are our priority.  As part of our continuing mission to provide you with exceptional heart care, we have created designated Provider Care Teams.  These Care Teams include your primary Cardiologist (physician) and Advanced Practice Providers (APPs -  Physician Assistants and Nurse Practitioners) who all work together to provide you with the care you  need, when you need it.  We recommend signing up for the patient portal called "MyChart".  Sign up information is provided on this After Visit Summary.  MyChart is used to connect with patients for Virtual Visits (Telemedicine).  Patients are able to view lab/test results, encounter notes, upcoming appointments, etc.  Non-urgent messages can be sent to your provider as well.   To learn more about what you can do with MyChart, go to [email or web address].    Your next appointment:   1) 10-14 days (from 04/08/20) with the [facility] in our [place] office for a Wound Check   2) 91 days (from 04/08/20) with Dr. Quentin Ore in the [facility] office  The format for your next appointment:   In Person  Provider:   as above   Other Instructions   Cardioverter Defibrillator Implantation An implantable cardioverter defibrillator (ICD) is a device that identifies and corrects abnormal heart rhythms. Cardioverter defibrillator implantation is a surgery to place an ICD under the skin in the chest or abdomen. An ICD has a battery, a small computer (pulse generator), and wires (leads) that go into the heart. The ICD detects and corrects two types of dangerous irregular heart rhythms (arrhythmias):  A rapid heart rhythm in the lower chambers of the heart (ventricles). This is called ventricular tachycardia.  The ventricles contracting in an uncoordinated way. This is called ventricular fibrillation. There are different types of ICDs, and the electrical signals from the ICD can be programmed differently based on the condition being treated. The electrical signals from the ICD can be  low-energy pulses, high-energy shocks, or a combination of the two. The low-energy pulses are generally used to restore the heartbeat to normal when it is either too slow (bradycardia) or too fast. These pulses are painless. The high-energy shocks are used to treat abnormal rhythms such as ventricular tachycardia or ventricular  fibrillation. This shock may feel like a strong jolt in the chest. Your health care provider may recommend an ICD if you have:  Had a ventricular arrhythmia in the past.  A damaged heart because of a disease or heart condition.  A weakened heart muscle from a heart attack or cardiac arrest.  A congenital heart defect.  Long QT syndrome, which is a disorder of the heart's electrical system.  Brugada syndrome, which is a condition that causes a disruption of the heart's normal rhythm. Tell a health care provider about:  Any allergies you have.  All medicines you are taking, including vitamins, herbs, eye drops, creams, and over-the-counter medicines.  Any problems you or family members have had with anesthetic medicines.  Any blood disorders you have.  Any surgeries you have had.  Any medical conditions you have.  Whether you are pregnant or may be pregnant. What are the risks? Generally, this is a safe procedure. However, problems may occur, including:  Infection.  Bleeding.  Allergic reactions to medicines used during the procedure.  Blood clots.  Swelling or bruising.  Damage to nearby structures or organs, such as nerves, lungs, blood vessels, or the heart where the ICD leads or pulse generator is implanted. What happens before the procedure? Staying hydrated Follow instructions from your health care provider about hydration, which may include:  Up to 2 hours before the procedure - you may continue to drink clear liquids, such as water, clear fruit juice, black coffee, and plain tea.   Eating and drinking restrictions Follow instructions from your health care provider about eating and drinking, which may include:  8 hours before the procedure - stop eating heavy meals or foods, such as meat, fried foods, or fatty foods.  6 hours before the procedure - stop eating light meals or foods, such as toast or cereal.  6 hours before the procedure - stop drinking milk  or drinks that contain milk.  2 hours before the procedure - stop drinking clear liquids. Medicines Ask your health care provider about:  Changing or stopping your regular medicines. This is especially important if you are taking diabetes medicines or blood thinners.  Taking medicines such as aspirin and ibuprofen. These medicines can thin your blood. Do not take these medicines unless your health care provider tells you to take them.  Taking over-the-counter medicines, vitamins, herbs, and supplements. Tests You may have an exam or testing. These may include:  Blood tests.  A test to check the electrical signals in your heart (electrocardiogram, ECG).  Imaging tests, such as a chest X-ray.  Echocardiogram. This is an ultrasound of your heart to evaluate your heart structures and function.  An event monitor or Holter monitor to wear at home. General instructions  Do not use any products that contain nicotine or tobacco for at least 4 weeks before the procedure. These products include cigarettes, chewing tobacco, and vaping devices, such as e-cigarettes. If you need help quitting, ask your health care provider.  Ask your health care provider: ? How your procedure site will be marked. ? What steps will be taken to help prevent infection. These may include:  Removing hair at the surgery site.  Washing skin with a germ-killing soap.  Taking antibiotic medicine.  You may be asked to shower with a germ-killing soap.  Plan to have a responsible adult take you home from the hospital or clinic. What happens during the procedure?  Small monitors will be put on your body. They will be used to check your heart rate, blood pressure, and oxygen level.  A pair of sticky pads (defibrillator pads) may be placed on your back and chest. These pads are able to pace your heart as needed during the procedure.  An IV will be inserted into one of your veins.  You will be given one or more of  the following: ? A medicine to help you relax (sedative). ? A medicine to numb the area (local anesthetic). ? A medicine to make you fall asleep(general anesthetic).  A small incision will be made to create a deep pocket under the skin of your chest or abdomen.  Leads will be guided through a blood vessel into your heart and attached to your heart muscles. Depending on the ICD, the leads may go into one ventricle, or they may go into both ventricles and into an upper chamber of the heart. An X-ray machine (fluoroscope) will be used to help guide the leads. The other end of the leads will be attached to the pulse generator.  The pulse generator will be placed into the pocket under the skin.  The ICD will be tested, and your health care provider will program the ICD for the condition being treated.  The incision will be closed with stitches (sutures), skin glue, adhesive strips, or staples.  A bandage (dressing) will be placed over the incision. The procedure may vary among health care providers and hospitals.   What happens after the procedure?  Your blood pressure, heart rate, breathing rate, and blood oxygen level will be monitored until you leave the hospital or clinic. Your health care provider will also monitor your ICD to make sure it is working properly.  A chest X-ray will be taken to check that the ICD is in the right place.  Do not raise the arm on the side of your procedure higher than your shoulder for as long as told by your health care provider. This is usually at least 6 weeks.  You may be given an identification card explaining that you have an ICD.  You will be given a remote home monitoring device to use with your ICD to allow your device to communicate with your clinic. Summary  An implantable cardioverter defibrillator (ICD) is a device that identifies and corrects abnormal heart rhythms. Cardioverter defibrillator implantation is a surgery to place an ICD under the  skin in the chest or abdomen.  An ICD consists of a battery, a small computer (pulse generator), and wires (leads) that go into the heart.  During the procedure, the ICD will be tested, and your health care provider will program the ICD for the condition being treated.  After the procedure, a chest X-ray will be taken to check that the ICD is in the right place. This information is not intended to replace advice given to you by your health care provider. Make sure you discuss any questions you have with your health care provider. Document Revised: 07/26/2019 Document Reviewed: 07/26/2019 Elsevier Patient Education  [place].

## 2020-03-21 DIAGNOSIS — N1832 Chronic kidney disease, stage 3b: Secondary | ICD-10-CM | POA: Diagnosis not present

## 2020-03-28 DIAGNOSIS — I5022 Chronic systolic (congestive) heart failure: Secondary | ICD-10-CM | POA: Diagnosis not present

## 2020-03-28 DIAGNOSIS — N1832 Chronic kidney disease, stage 3b: Secondary | ICD-10-CM | POA: Diagnosis not present

## 2020-03-28 DIAGNOSIS — D631 Anemia in chronic kidney disease: Secondary | ICD-10-CM | POA: Diagnosis not present

## 2020-03-28 DIAGNOSIS — I129 Hypertensive chronic kidney disease with stage 1 through stage 4 chronic kidney disease, or unspecified chronic kidney disease: Secondary | ICD-10-CM | POA: Diagnosis not present

## 2020-03-28 DIAGNOSIS — E1122 Type 2 diabetes mellitus with diabetic chronic kidney disease: Secondary | ICD-10-CM | POA: Diagnosis not present

## 2020-04-04 ENCOUNTER — Other Ambulatory Visit
Admission: RE | Admit: 2020-04-04 | Discharge: 2020-04-04 | Disposition: A | Discharge status: Home / Self Care | Payer: PPO | Source: Ambulatory Visit | Attending: Cardiology | Admitting: Cardiology

## 2020-04-04 ENCOUNTER — Other Ambulatory Visit
Admission: RE | Admit: 2020-04-04 | Discharge: 2020-04-04 | Disposition: A | Discharge status: Home / Self Care | Payer: PPO | Attending: Cardiology | Admitting: Cardiology

## 2020-04-04 ENCOUNTER — Other Ambulatory Visit: Payer: Self-pay

## 2020-04-04 DIAGNOSIS — Z01812 Encounter for preprocedural laboratory examination: Secondary | ICD-10-CM | POA: Insufficient documentation

## 2020-04-04 DIAGNOSIS — Z20822 Contact with and (suspected) exposure to covid-19: Secondary | ICD-10-CM | POA: Insufficient documentation

## 2020-04-04 DIAGNOSIS — I5042 Chronic combined systolic (congestive) and diastolic (congestive) heart failure: Secondary | ICD-10-CM | POA: Diagnosis not present

## 2020-04-04 LAB — CBC WITH DIFFERENTIAL/PLATELET
Abs Immature Granulocytes: 0.01 10*3/uL (ref 0.00–0.07)
Basophils Absolute: 0.1 10*3/uL (ref 0.0–0.1)
Basophils Relative: 1 %
Eosinophils Absolute: 0.1 10*3/uL (ref 0.0–0.5)
Eosinophils Relative: 2 %
HCT: 44.3 % (ref 39.0–52.0)
Hemoglobin: 14.5 g/dL (ref 13.0–17.0)
Immature Granulocytes: 0 %
Lymphocytes Relative: 21 %
Lymphs Abs: 1.6 10*3/uL (ref 0.7–4.0)
MCH: 31 pg (ref 26.0–34.0)
MCHC: 32.7 g/dL (ref 30.0–36.0)
MCV: 94.7 fL (ref 80.0–100.0)
Monocytes Absolute: 0.6 10*3/uL (ref 0.1–1.0)
Monocytes Relative: 8 %
Neutro Abs: 5.4 10*3/uL (ref 1.7–7.7)
Neutrophils Relative %: 68 %
Platelets: 187 10*3/uL (ref 150–400)
RBC: 4.68 MIL/uL (ref 4.22–5.81)
RDW: 14.5 % (ref 11.5–15.5)
WBC: 7.9 10*3/uL (ref 4.0–10.5)
nRBC: 0 % (ref 0.0–0.2)

## 2020-04-04 LAB — BASIC METABOLIC PANEL
Anion gap: 9 (ref 5–15)
BUN: 18 mg/dL (ref 8–23)
CO2: 25 mmol/L (ref 22–32)
Calcium: 9.1 mg/dL (ref 8.9–10.3)
Chloride: 102 mmol/L (ref 98–111)
Creatinine, Ser: 1.37 mg/dL — ABNORMAL HIGH (ref 0.61–1.24)
GFR, Estimated: 55 mL/min — ABNORMAL LOW (ref >60)
Glucose, Bld: 131 mg/dL — ABNORMAL HIGH (ref 70–99)
Potassium: 4.2 mmol/L (ref 3.5–5.1)
Sodium: 136 mmol/L (ref 135–145)

## 2020-04-04 LAB — SARS CORONAVIRUS 2 (TAT 6-24 HRS): SARS Coronavirus 2: NEGATIVE

## 2020-04-05 NOTE — Progress Notes (Signed)
Instructed patient on the following items: Arrival time 1030 Nothing to eat or drink after midnight No meds AM of procedure Responsible person to drive you home and stay with you for 24 hrs Wash with special soap night before and morning of procedure Stop taking Plavix after today's dose

## 2020-04-08 ENCOUNTER — Ambulatory Visit (HOSPITAL_COMMUNITY)
Admission: RE | Disposition: A | Discharge status: Home / Self Care | Payer: Self-pay | Source: Home / Self Care | Attending: Cardiology

## 2020-04-08 ENCOUNTER — Other Ambulatory Visit: Payer: Self-pay

## 2020-04-08 ENCOUNTER — Ambulatory Visit (HOSPITAL_COMMUNITY)
Admission: RE | Admit: 2020-04-08 | Discharge: 2020-04-08 | Disposition: A | Discharge status: Home / Self Care | Payer: PPO | Attending: Cardiology | Admitting: Cardiology

## 2020-04-08 ENCOUNTER — Ambulatory Visit (HOSPITAL_COMMUNITY): Payer: PPO

## 2020-04-08 DIAGNOSIS — Z95810 Presence of automatic (implantable) cardiac defibrillator: Secondary | ICD-10-CM

## 2020-04-08 DIAGNOSIS — Z79899 Other long term (current) drug therapy: Secondary | ICD-10-CM | POA: Insufficient documentation

## 2020-04-08 DIAGNOSIS — Z87891 Personal history of nicotine dependence: Secondary | ICD-10-CM | POA: Insufficient documentation

## 2020-04-08 DIAGNOSIS — Z7902 Long term (current) use of antithrombotics/antiplatelets: Secondary | ICD-10-CM | POA: Diagnosis not present

## 2020-04-08 DIAGNOSIS — E1122 Type 2 diabetes mellitus with diabetic chronic kidney disease: Secondary | ICD-10-CM | POA: Insufficient documentation

## 2020-04-08 DIAGNOSIS — I130 Hypertensive heart and chronic kidney disease with heart failure and stage 1 through stage 4 chronic kidney disease, or unspecified chronic kidney disease: Secondary | ICD-10-CM | POA: Diagnosis not present

## 2020-04-08 DIAGNOSIS — Z7984 Long term (current) use of oral hypoglycemic drugs: Secondary | ICD-10-CM | POA: Insufficient documentation

## 2020-04-08 DIAGNOSIS — I5042 Chronic combined systolic (congestive) and diastolic (congestive) heart failure: Secondary | ICD-10-CM | POA: Insufficient documentation

## 2020-04-08 DIAGNOSIS — I255 Ischemic cardiomyopathy: Secondary | ICD-10-CM | POA: Insufficient documentation

## 2020-04-08 DIAGNOSIS — I2510 Atherosclerotic heart disease of native coronary artery without angina pectoris: Secondary | ICD-10-CM | POA: Diagnosis not present

## 2020-04-08 DIAGNOSIS — N182 Chronic kidney disease, stage 2 (mild): Secondary | ICD-10-CM | POA: Diagnosis not present

## 2020-04-08 HISTORY — PX: BIV ICD INSERTION CRT-D: EP1195

## 2020-04-08 LAB — GLUCOSE, CAPILLARY: Glucose-Capillary: 123 mg/dL — ABNORMAL HIGH (ref 70–99)

## 2020-04-08 SURGERY — BIV ICD INSERTION CRT-D

## 2020-04-08 MED ORDER — LIDOCAINE HCL (PF) 1 % IJ SOLN
INTRAMUSCULAR | Status: DC | PRN
  Administered 2020-04-08: 60 mL

## 2020-04-08 MED ORDER — POVIDONE-IODINE 10 % EX SWAB: 2.0000 "application " | Freq: Once | CUTANEOUS | Status: DC

## 2020-04-08 MED ORDER — HEPARIN (PORCINE) IN NACL 1000-0.9 UT/500ML-% IV SOLN
INTRAVENOUS | Status: AC
  Filled 2020-04-08: qty 500

## 2020-04-08 MED ORDER — CEFAZOLIN SODIUM-DEXTROSE 2-4 GM/100ML-% IV SOLN
2.0000 g | INTRAVENOUS | Status: AC
  Administered 2020-04-08: 2 g via INTRAVENOUS
  Filled 2020-04-08: qty 100

## 2020-04-08 MED ORDER — MIDAZOLAM HCL 5 MG/5ML IJ SOLN
INTRAMUSCULAR | Status: DC | PRN
  Administered 2020-04-08 (×2): 1 mg via INTRAVENOUS

## 2020-04-08 MED ORDER — CHLORHEXIDINE GLUCONATE 4 % EX LIQD
4.0000 "application " | Freq: Once | CUTANEOUS | Status: DC
  Filled 2020-04-08: qty 60

## 2020-04-08 MED ORDER — FENTANYL CITRATE (PF) 100 MCG/2ML IJ SOLN
INTRAMUSCULAR | Status: AC
  Filled 2020-04-08: qty 2

## 2020-04-08 MED ORDER — HEPARIN (PORCINE) IN NACL 1000-0.9 UT/500ML-% IV SOLN
INTRAVENOUS | Status: DC | PRN
  Administered 2020-04-08: 500 mL

## 2020-04-08 MED ORDER — SODIUM CHLORIDE 0.9 % IV SOLN
80.0000 mg | INTRAVENOUS | Status: DC
  Filled 2020-04-08: qty 2

## 2020-04-08 MED ORDER — FENTANYL CITRATE (PF) 100 MCG/2ML IJ SOLN
INTRAMUSCULAR | Status: DC | PRN
  Administered 2020-04-08 (×2): 25 ug via INTRAVENOUS

## 2020-04-08 MED ORDER — ONDANSETRON HCL 4 MG/2ML IJ SOLN: 4.0000 mg | Freq: Four times a day (QID) | INTRAMUSCULAR | Status: DC | PRN

## 2020-04-08 MED ORDER — LIDOCAINE HCL 1 % IJ SOLN
INTRAMUSCULAR | Status: AC
  Filled 2020-04-08: qty 60

## 2020-04-08 MED ORDER — MIDAZOLAM HCL 5 MG/5ML IJ SOLN
INTRAMUSCULAR | Status: AC
  Filled 2020-04-08: qty 5

## 2020-04-08 MED ORDER — ACETAMINOPHEN 325 MG PO TABS: 325.0000 mg | ORAL_TABLET | ORAL | Status: DC | PRN

## 2020-04-08 MED ORDER — SODIUM CHLORIDE 0.9 % IV SOLN: INTRAVENOUS | Status: DC

## 2020-04-08 MED ORDER — SODIUM CHLORIDE 0.9 % IV SOLN
INTRAVENOUS | Status: AC
  Filled 2020-04-08: qty 2

## 2020-04-08 MED ORDER — CEFAZOLIN SODIUM-DEXTROSE 2-4 GM/100ML-% IV SOLN
INTRAVENOUS | Status: AC
  Filled 2020-04-08: qty 100

## 2020-04-08 SURGICAL SUPPLY — 15 items
CABLE SURGICAL S-101-97-12 (CABLE) ×2 IMPLANT
CATH ATTAIN COM SURV 6250V-EH (CATHETERS) ×2 IMPLANT
ICD GALLANT HFCRTD CDHFA500Q (ICD Generator) ×2 IMPLANT
LEAD DURATA 7122Q-65CM (Lead) ×2 IMPLANT
LEAD QUARTET 1458QL-86 (Lead) ×1 IMPLANT
LEAD TENDRIL MRI 52CM LPA1200M (Lead) ×2 IMPLANT
PAD PRO RADIOLUCENT 2001M-C (PAD) ×2 IMPLANT
QUARTET 1458QL-86 (Lead) ×2 IMPLANT
SHEATH 7FR PRELUDE SNAP 13 (SHEATH) ×2 IMPLANT
SHEATH 8FR PRELUDE SNAP 13 (SHEATH) ×2 IMPLANT
SHEATH 9.5FR PRELUDE SNAP 13 (SHEATH) ×2 IMPLANT
SLITTER 6232ADJ (MISCELLANEOUS) ×2 IMPLANT
TRAY PACEMAKER INSERTION (PACKS) ×2 IMPLANT
WIRE ACUITY WHISPER EDS 4648 (WIRE) ×2 IMPLANT
WIRE HI TORQ VERSACORE-J 145CM (WIRE) ×2 IMPLANT

## 2020-04-08 NOTE — Discharge Instructions (Signed)
    Supplemental Discharge Instructions for  Pacemaker/Defibrillator Patients  Tomorrow, 04/09/20, send in a device transmission  Activity No heavy lifting or vigorous activity with your left/right arm for 6 to 8 weeks.  Do not raise your left/right arm above your head for one week.  Gradually raise your affected arm as drawn below.              04/15/20                      04/16/20                      04/17/20                     04/18/20 __  NO DRIVING for  1 week  ; you may begin driving on   0/3/47  .  WOUND CARE - Keep the wound area clean and dry.  Do not get this area wet , no showers until cleared to at your wound check visit. - Tomorrow, 04/09/20, remove the arm sling - Tomorrow, 04/09/20 remove the large outer plastic bandage.  Underneath the plastic bandage there are steri strips (paper tapes), DO NOT remove these. - The tape/steri-strips on your wound will fall off; do not pull them off.  No bandage is needed on the site.  DO  NOT apply any creams, oils, or ointments to the wound area. - If you notice any drainage or discharge from the wound, any swelling or bruising at the site, or you develop a fever > 101? F after you are discharged home, call the office at once.  Special Instructions - You are still able to use cellular telephones; use the ear opposite the side where you have your pacemaker/defibrillator.  Avoid carrying your cellular phone near your device. - When traveling through airports, show security personnel your identification card to avoid being screened in the metal detectors.  Ask the security personnel to use the hand wand. - Avoid arc welding equipment, MRI testing (magnetic resonance imaging), TENS units (transcutaneous nerve stimulators).  Call the office for questions about other devices. - Avoid electrical appliances that are in poor condition or are not properly grounded. - Microwave ovens are safe to be near or to operate.  Additional information for  defibrillator patients should your device go off: - If your device goes off ONCE and you feel fine afterward, notify the device clinic nurses. - If your device goes off ONCE and you do not feel well afterward, call 911. - If your device goes off TWICE, call 911. - If your device goes off THREE times in one day, call 911.  DO NOT DRIVE YOURSELF OR A FAMILY MEMBER WITH A DEFIBRILLATOR TO THE HOSPITAL--CALL 911.

## 2020-04-08 NOTE — Interval H&P Note (Signed)
History and Physical Interval Note:  04/08/2020 1:24 PM  Devin Hanna  has presented today for surgery, with the diagnosis of heart faiure.  The various methods of treatment have been discussed with the patient and family. After consideration of risks, benefits and other options for treatment, the patient has consented to  Procedure(s): BIV ICD INSERTION CRT-D (N/A) as a surgical intervention.  The patient's history has been reviewed, patient examined, no change in status, stable for surgery.  I have reviewed the patient's chart and labs.  Questions were answered to the patient's satisfaction.     Devin Hanna

## 2020-04-09 ENCOUNTER — Encounter (HOSPITAL_COMMUNITY): Payer: Self-pay | Admitting: Cardiology

## 2020-04-09 ENCOUNTER — Telehealth: Payer: Self-pay

## 2020-04-09 NOTE — Telephone Encounter (Signed)
Follow-up after same day discharge: Implant date: 04/08/20 MD: Dr. Quentin Ore Device: SJM CRT-D Location: Left chest   Wound check visit: 04/18/20 90 day MD follow-up: 07/10/20    Remote Transmission received:Last comm on 04/09/20  Dressing removed: Not yet, pt states his daughter is coming to remove tonight.    Reviewed activity restrictions.

## 2020-04-09 NOTE — Telephone Encounter (Signed)
-----   Message from [place], [place] sent at 04/08/2020  3:47 PM EST ----- Same day d/c  SJM CRT-D  CL

## 2020-04-11 MED FILL — Gentamicin Sulfate Inj 40 MG/ML: INTRAMUSCULAR | Qty: 80 | Status: AC

## 2020-04-11 MED FILL — Lidocaine HCl Local Inj 1%: INTRAMUSCULAR | Qty: 60 | Status: AC

## 2020-04-18 ENCOUNTER — Other Ambulatory Visit: Payer: Self-pay

## 2020-04-18 ENCOUNTER — Ambulatory Visit (INDEPENDENT_AMBULATORY_CARE_PROVIDER_SITE_OTHER): Payer: PPO | Admitting: Emergency Medicine

## 2020-04-18 DIAGNOSIS — I5022 Chronic systolic (congestive) heart failure: Secondary | ICD-10-CM | POA: Diagnosis not present

## 2020-04-18 LAB — CUP PACEART INCLINIC DEVICE CHECK
Battery Remaining Longevity: 61 mo
Brady Statistic RA Percent Paced: 7.2 %
Brady Statistic RV Percent Paced: 96 %
Date Time Interrogation Session: 20220310103716
HighPow Impedance: 68.625
Implantable Lead Implant Date: 20220228
Implantable Lead Implant Date: 20220228
Implantable Lead Implant Date: 20220228
Implantable Lead Location: 753858
Implantable Lead Location: 753859
Implantable Lead Location: 753860
Implantable Pulse Generator Implant Date: 20220228
Lead Channel Impedance Value: 437.5 Ohm
Lead Channel Impedance Value: 487.5 Ohm
Lead Channel Impedance Value: 625 Ohm
Lead Channel Pacing Threshold Amplitude: 0.75 V
Lead Channel Pacing Threshold Amplitude: 0.75 V
Lead Channel Pacing Threshold Amplitude: 0.75 V
Lead Channel Pacing Threshold Amplitude: 0.75 V
Lead Channel Pacing Threshold Amplitude: 1.25 V
Lead Channel Pacing Threshold Amplitude: 1.25 V
Lead Channel Pacing Threshold Pulse Width: 0.5 ms
Lead Channel Pacing Threshold Pulse Width: 0.5 ms
Lead Channel Pacing Threshold Pulse Width: 0.5 ms
Lead Channel Pacing Threshold Pulse Width: 0.5 ms
Lead Channel Pacing Threshold Pulse Width: 0.5 ms
Lead Channel Pacing Threshold Pulse Width: 0.5 ms
Lead Channel Sensing Intrinsic Amplitude: 12 mV
Lead Channel Sensing Intrinsic Amplitude: 3.5 mV
Lead Channel Setting Pacing Amplitude: 3.5 V
Lead Channel Setting Pacing Amplitude: 3.5 V
Lead Channel Setting Pacing Amplitude: 3.5 V
Lead Channel Setting Pacing Pulse Width: 0.5 ms
Lead Channel Setting Pacing Pulse Width: 0.5 ms
Lead Channel Setting Sensing Sensitivity: 0.5 mV
Pulse Gen Serial Number: 810018869

## 2020-04-18 NOTE — Progress Notes (Signed)
Wound check appointment. Steri-strips removed. Wound without redness or edema. Incision edges approximated, wound well healed. Normal device function. Thresholds, sensing, and impedances consistent with implant measurements. Device programmed at 3.5V for extra safety margin until 3 month visit. Histogram distribution appropriate for patient and level of activity. No mode switches or ventricular arrhythmias noted. Patient is BiVpacing 96% of the time.  Patient educated about wound care, arm mobility, lifting restrictions, shock plan. Auditory alerts demonstrated for patient.  ROV 07/10/20 with Dr. Quentin Ore in [place].  Patient is enrolled in remote monitoring, next scheduled check 07/09/20.

## 2020-04-26 ENCOUNTER — Other Ambulatory Visit: Payer: Self-pay

## 2020-04-26 ENCOUNTER — Ambulatory Visit: Payer: PPO | Admitting: Pharmacist

## 2020-04-26 ENCOUNTER — Ambulatory Visit (INDEPENDENT_AMBULATORY_CARE_PROVIDER_SITE_OTHER): Payer: PPO | Admitting: Family Medicine

## 2020-04-26 ENCOUNTER — Encounter: Payer: Self-pay | Admitting: Family Medicine

## 2020-04-26 DIAGNOSIS — Z8549 Personal history of malignant neoplasm of other male genital organs: Secondary | ICD-10-CM

## 2020-04-26 DIAGNOSIS — K279 Peptic ulcer, site unspecified, unspecified as acute or chronic, without hemorrhage or perforation: Secondary | ICD-10-CM

## 2020-04-26 DIAGNOSIS — E1142 Type 2 diabetes mellitus with diabetic polyneuropathy: Secondary | ICD-10-CM | POA: Diagnosis not present

## 2020-04-26 DIAGNOSIS — I5022 Chronic systolic (congestive) heart failure: Secondary | ICD-10-CM | POA: Diagnosis not present

## 2020-04-26 DIAGNOSIS — I25118 Atherosclerotic heart disease of native coronary artery with other forms of angina pectoris: Secondary | ICD-10-CM

## 2020-04-26 DIAGNOSIS — I10 Essential (primary) hypertension: Secondary | ICD-10-CM

## 2020-04-26 DIAGNOSIS — N1832 Chronic kidney disease, stage 3b: Secondary | ICD-10-CM

## 2020-04-26 MED ORDER — ENTRESTO 24-26 MG PO TABS: 1.0000 | ORAL_TABLET | Freq: Two times a day (BID) | ORAL | 3 refills | Status: DC

## 2020-04-26 NOTE — Progress Notes (Signed)
Devin Rumps, MD Phone: [phone]  Devin Hanna is a 70 y.o. male who presents today for f/u.  DIABETES Disease Monitoring: Blood Sugar ranges-95-161 typically Polyuria/phagia/dipsia- no       Medications: Compliance- taking farxiga, trulicity Hypoglycemic symptoms- no He has cut down on his food intake due to decreased appetite from the trulicity  CHF: Patient had an ICD placed.  He followed up with cardiology and they noted things were going well with that.  He is on carvedilol, farxiga, hydralazine, Lasix, and Entresto.  No chest pain or shortness of breath.  Has chronic lower extremity edema that resolves overnight.  No orthopnea or PND.  Blood pressure has been in the 120s-130s/60s-70s early in the day though later in the day it drops down to the 120s over low 60s.  Peptic ulcer disease: Patient with a history of this.  Uses Protonix twice daily.  No abdominal pain, blood in stool, or melena.  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   Social History   Tobacco Use  Smoking Status Former Smoker  . Packs/day: 3.00  . Years: 40.00  . Pack years: 120.00  . Types: Cigarettes  . Quit date: 04/05/1995  . Years since quitting: 25.0  Smokeless Tobacco Former Systems developer  Tobacco Comment   started smoking at age 10    Current Outpatient Medications on File Prior to Visit  Medication Sig Dispense Refill  . atorvastatin (LIPITOR) 40 MG tablet TAKE 1 TABLET BY MOUTH DAILY (Patient taking differently: Take 40 mg by mouth daily.) 90 tablet 1  . carvedilol (COREG) 6.25 MG tablet TAKE 1 TABLET BY MOUTH TWICE DAILY WITH A MEAL (Patient taking differently: Take 6.25 mg by mouth 2 (two) times daily with a meal.) 60 tablet 4  . CINNAMON PO Take 1,000 mg by mouth in the morning and at bedtime.    . clopidogrel (PLAVIX) 75 MG tablet TAKE ONE TABLET EVERY MORNING WITH BREAKFAST (Patient taking differently: Take 75 mg by mouth daily.) 90 tablet 3  . dapagliflozin  propanediol ([name]) 10 MG TABS tablet Take 1 tablet (10 mg total) by mouth daily before breakfast. 30 tablet 3  . Dulaglutide ([name]) 3 UJ/8.1XB SOPN Inject 3 mg as directed once a week. (Patient taking differently: Inject 3 mg as directed once a week. Wednesday) 6 mL 0  . ferrous sulfate 325 (65 FE) MG tablet Take 325 mg by mouth daily with breakfast.    . furosemide (LASIX) 40 MG tablet Take 1 tablet (40 mg total) by mouth daily. 90 tablet 3  . gabapentin (NEURONTIN) 300 MG capsule TAKE 1 CAPSULE BY MOUTH 3 TIMES DAILY (Patient taking differently: Take 300 mg by mouth 3 (three) times daily.) 90 capsule 3  . hydrALAZINE (APRESOLINE) 25 MG tablet TAKE ONE TABLET EACH MORNING AND AT BEDTIME (Patient taking differently: Take 25 mg by mouth 2 (two) times daily.) 60 tablet 2  . Multiple Vitamin (MULTIVITAMIN WITH MINERALS) TABS Take 1 tablet by mouth daily. Multivitamin for Men 64+    . nitroGLYCERIN (NITROSTAT) 0.4 MG SL tablet Place 1 tablet (0.4 mg total) under the tongue every 5 (five) minutes as needed for chest pain. 25 tablet 3  . pantoprazole (PROTONIX) 40 MG tablet TAKE ONE TABLET TWICE DAILY (Patient taking differently: Take 40 mg by mouth 2 (two) times daily.) 180 tablet 1  . sacubitril-valsartan (ENTRESTO) 24-26 MG Take 1 tablet by mouth 2 (two) times daily. 60 tablet 3   No current facility-administered medications on  file prior to visit.     ROS see history of present illness  Objective  Physical Exam Vitals:   04/26/20 0827  BP: 120/80  Pulse: 74  Temp: 97.6 F (36.4 C)  SpO2: 99%    BP Readings from Last 3 Encounters:  04/26/20 120/80  04/08/20 (!) 138/57  03/15/20 108/66   Wt Readings from Last 3 Encounters:  04/26/20 237 lb 9.6 oz (107.8 kg)  04/08/20 233 lb (105.7 kg)  03/15/20 240 lb (108.9 kg)    Physical Exam Constitutional:      General: He is not in acute distress.    Appearance: He is not diaphoretic.  Cardiovascular:     Rate and Rhythm: Normal  rate and regular rhythm.     Heart sounds: Normal heart sounds.  Pulmonary:     Effort: Pulmonary effort is normal.     Breath sounds: Normal breath sounds.  Genitourinary:    Penis: Normal.      Comments: No inguinal adenopathy Skin:    General: Skin is warm and dry.  Neurological:     Mental Status: He is alert.      Assessment/Plan: Please see individual problem list.  Problem List Items Addressed This Visit    Chronic systolic heart failure ([place])    Appears to be euvolemic.  The patient will continue Entresto 1 tablet twice daily, hydralazine 25 mg twice daily, Lasix 40 mg daily, farxiga 10 mg daily, and carvedilol 6.25 mg twice daily.      DM type 2 (diabetes mellitus, type 2) (HCC)    Continue farxiga 10 mg once daily and [name] 3 mg once weekly.  Plan for A1c at next visit.  Patient defers nephropathy screening today given inability to urinate.      History of penile cancer    No apparent recurrence.  Discussed yearly exam for monitoring.      HTN (hypertension)    Slightly above goal in the morning though he has borderline low diastolic blood pressures later in the day.  This limits the ability to alter his regimen.  He will continue to monitor his blood pressure.      Peptic ulcer disease    Asymptomatic.  He will continue Protonix 40 mg twice daily.           This visit occurred during the SARS-CoV-2 public health emergency.  Safety protocols were in place, including screening questions prior to the visit, additional usage of staff PPE, and extensive cleaning of exam room while observing appropriate contact time as indicated for disinfecting solutions.    Devin Rumps, MD [place]

## 2020-04-26 NOTE — Chronic Care Management (AMB) (Signed)
Chronic Care Management Pharmacy Note  04/26/2020 Name:  Devin Hanna MRN:  [id number] DOB:  [birth year]  Subjective: Devin Hanna is an 70 y.o. year old male who is a primary patient of [name], Angela Adam, MD.  The CCM team was consulted for assistance with disease management and care coordination needs.    Engaged with patient face to face while he was in office for PCP viist for reponse to question about medication access in response to provider referral for pharmacy case management and/or care coordination services.   Consent to Services:  The patient was given information about Chronic Care Management services, agreed to services, and gave verbal consent prior to initiation of services.  Please see initial visit note for detailed documentation.   Patient Care Team: [name], MD as PCP - General (Family Medicine) [name], MD as PCP - Cardiology (Cardiology) Vickie Epley, MD as PCP - Electrophysiology (Cardiology) Clent Jacks, RN as Oncology Nurse Navigator [name], RPH-CPP (Pharmacist)  Recent office visits: PCP today  Recent consult visits:  2/4 Quentin Ore, work up for CRT-D  3/10 - wound check s/p ICD   Hospital visits:  2/28 - ICD implant  Objective:  Lab Results  Component Value Date   CREATININE 1.37 (H) 04/04/2020   CREATININE 1.32 (H) 01/22/2020   CREATININE 1.83 (H) 09/29/2019    Lab Results  Component Value Date   HGBA1C 7.1 (H) 02/14/2020   Last diabetic Eye exam: No results found for: HMDIABEYEEXA  Last diabetic Foot exam: No results found for: HMDIABFOOTEX      Component Value Date/Time   CHOL 92 02/14/2020 0812   CHOL 103 06/07/2014 0824   TRIG 190.0 (H) 02/14/2020 0812   HDL 31.00 (L) 02/14/2020 0812   HDL 38 (L) 06/07/2014 0824   CHOLHDL 3 02/14/2020 0812   VLDL 38.0 02/14/2020 0812   LDLCALC 23 02/14/2020 0812   LDLCALC 59 03/30/2018 0930    Hepatic Function Latest Ref Rng & Units  01/22/2020 08/15/2019 07/30/2019  Total Protein 6.5 - 8.1 g/dL 7.1 6.8 6.7  Albumin 3.5 - 5.0 g/dL 3.5 3.7 3.3(L)  AST 15 - 41 U/L 52(H) 50(H) 60(H)  ALT 0 - 44 U/L 45(H) 32 36  Alk Phosphatase 38 - 126 U/L 134(H) 79 86  Total Bilirubin 0.3 - 1.2 mg/dL 0.8 1.2 1.7(H)  Bilirubin, Direct - - - -    Lab Results  Component Value Date/Time   TSH 0.889 09/09/2014 03:05 AM   TSH 3.056 04/04/2013 10:20 AM    CBC Latest Ref Rng & Units 04/04/2020 01/22/2020 08/15/2019  WBC 4.0 - 10.5 K/uL 7.9 6.6 6.8  Hemoglobin 13.0 - 17.0 g/dL 14.5 13.7 12.7(L)  Hematocrit 39.0 - 52.0 % 44.3 42.1 38.0(L)  Platelets 150 - 400 K/uL 187 184 161    Clinical ASCVD: Yes    Social History   Tobacco Use  Smoking Status Former Smoker  . Packs/day: 3.00  . Years: 40.00  . Pack years: 120.00  . Types: Cigarettes  . Quit date: 04/05/1995  . Years since quitting: 25.0  Smokeless Tobacco Former User  Tobacco Comment   started smoking at age 34   BP Readings from Last 3 Encounters:  04/26/20 120/80  04/08/20 (!) 138/57  03/15/20 108/66   Pulse Readings from Last 3 Encounters:  04/26/20 74  04/08/20 67  03/15/20 69   Wt Readings from Last 3 Encounters:  04/26/20 237 lb 9.6 oz (  107.8 kg)  04/08/20 233 lb (105.7 kg)  03/15/20 240 lb (108.9 kg)    Assessment: Review of patient past medical history, allergies, medications, health status, including review of consultants reports, laboratory and other test data, was performed as part of comprehensive evaluation and provision of chronic care management services.   SDOH:  (Social Determinants of Health) assessments and interventions performed:  SDOH Interventions   Flowsheet Row Most Recent Value  SDOH Interventions   Financial Strain Interventions Other (Comment)  [manufacturer assistance]      CCM Care Plan  No Known Allergies  Medications Reviewed Today    Reviewed by Gordy Councilman, CMA (Certified Medical Assistant) on 04/26/20 at [phone]  Med List  Status: <None>  Medication Order Taking? Sig Documenting Provider Last Dose Status Informant  atorvastatin (LIPITOR) 40 MG tablet [id number] Yes TAKE 1 TABLET BY MOUTH DAILY  Patient taking differently: Take 40 mg by mouth daily.   Theora Gianotti, NP Taking Active Child  carvedilol (COREG) 6.25 MG tablet [id number] Yes TAKE 1 TABLET BY MOUTH TWICE DAILY WITH A MEAL  Patient taking differently: Take 6.25 mg by mouth 2 (two) times daily with a meal.   [name], MD Taking Active Child  CINNAMON PO [id number] Yes Take 1,000 mg by mouth in the morning and at bedtime. [provider] Taking Active Child  clopidogrel (PLAVIX) 75 MG tablet [id number] Yes TAKE ONE TABLET EVERY MORNING WITH BREAKFAST  Patient taking differently: Take 75 mg by mouth daily.   [name], NP Taking Active Child  dapagliflozin propanediol ([name]) 10 MG TABS tablet [id number] Yes Take 1 tablet (10 mg total) by mouth daily before breakfast. [name], MD Taking Active Child  Dulaglutide ([name]) 3 FG/1.8EX SOPN [id number] Yes Inject 3 mg as directed once a week.  Patient taking differently: Inject 3 mg as directed once a week. Wednesday   [name], MD Taking Active Child  ferrous sulfate 325 (65 FE) MG tablet [id number] Yes Take 325 mg by mouth daily with breakfast. [provider] Taking Active Child  furosemide (LASIX) 40 MG tablet [id number] Yes Take 1 tablet (40 mg total) by mouth daily. [name], NP Taking Active Child  gabapentin (NEURONTIN) 300 MG capsule [id number] Yes TAKE 1 CAPSULE BY MOUTH 3 TIMES DAILY  Patient taking differently: Take 300 mg by mouth 3 (three) times daily.   [name], MD Taking Active Child  hydrALAZINE (APRESOLINE) 25 MG tablet [id number] Yes TAKE ONE TABLET EACH MORNING AND AT BEDTIME  Patient taking differently: Take 25 mg by mouth 2 (two) times daily.   [name], MD Taking Active Child  Multiple Vitamin  (MULTIVITAMIN WITH MINERALS) TABS [id number] Yes Take 1 tablet by mouth daily. Multivitamin for Men 50+ [provider] Taking Active Child  nitroGLYCERIN (NITROSTAT) 0.4 MG SL tablet [id number] Yes Place 1 tablet (0.4 mg total) under the tongue every 5 (five) minutes as needed for chest pain. [name], MD Taking Active Child  pantoprazole (PROTONIX) 40 MG tablet [id number] Yes TAKE ONE TABLET TWICE DAILY  Patient taking differently: Take 40 mg by mouth 2 (two) times daily.   [name], NP Taking Active Child  sacubitril-valsartan (ENTRESTO) 24-26 MG [id number] Yes Take 1 tablet by mouth 2 (two) times daily. [name], NP Taking Active Child          Patient Active Problem List   Diagnosis Date Noted  . Cough 01/11/2020  .  Left knee pain 01/11/2020  . Abnormal cardiovascular stress test   . Acute on chronic systolic heart failure ([place]) 07/30/2019  . AK (actinic keratosis) 04/03/2019  . Colon cancer screening   . Rectal polyp   . Polyp of transverse colon   . Liver lesion 12/06/2018  . Chronic kidney disease (CKD), stage III (moderate) ([place]) 12/06/2018  . Abrasion 12/06/2018  . History of anemia 03/30/2018  . Hyperpigmented skin lesion 12/20/2017  . Bilateral foot pain 12/20/2017  . DDD (degenerative disc disease), lumbar 08/18/2017  . SCC (squamous cell carcinoma), trunk 05/17/2017  . Venous insufficiency 02/12/2017  . Decreased pedal pulses 02/12/2017  . Skin lesion 02/12/2017  . Neuropathy 01/09/2016  . Elevated LFTs 06/28/2015  . History of penile cancer 06/28/2015  . Peptic ulcer disease 09/12/2014  . Erectile dysfunction 09/07/2013  . Chronic systolic heart failure ([place]) 05/15/2013  . Hyperlipidemia 05/15/2013  . DM type 2 (diabetes mellitus, type 2) ([place]) 04/07/2013  . Other and unspecified hyperlipidemia 04/07/2013  . Morbid obesity ([place]) 04/07/2013  . HTN (hypertension) 04/07/2013  . CAD (coronary artery disease) 04/04/2013     Immunization History  Administered Date(s) Administered  . Fluad Quad(high Dose 65+) 10/26/2018, 01/11/2020  . Influenza, High Dose Seasonal PF 01/09/2016, 10/27/2016, 12/20/2017  . Pneumococcal Conjugate-13 01/09/2016  . Pneumococcal Polysaccharide-23 09/09/2014  . Tdap 01/21/2018    Conditions to be addressed/monitored: CHF, CAD, HTN, HLD, DMII and CKD  Care Plan : Medication Management  Updates made by [name], RPH-CPP since 04/26/2020 12:00 AM    Problem: Diabetes, CHF, HTN     Long-Range Goal: Disease Progression Prevention   This Visit's Progress: On track  Recent Progress: On track  Priority: High  Note:   Current Barriers:  . Unable to independently afford treatment regimen . Unable to achieve control of diabetes  . Complex patient with multiple comorbidities including HFrEF, CAD, HLD  Pharmacist Clinical Goal(s):  Marland Kitchen Over the next 90 days, patient will verbalize ability to afford treatment regimen. . Over the next 90 days, patient will achieve adherence to monitoring guidelines and medication adherence to achieve therapeutic efficacy. . Over the next 90 days, patient will achieve control of diabetes as evidenced by improvement in A1c  Interventions: . 1:1 collaboration with [name], MD regarding development and update of comprehensive plan of care as evidenced by provider attestation and co-signature . Inter-disciplinary care team collaboration (see longitudinal plan of care) . Comprehensive medication review performed; medication list updated in electronic medical record    Diabetes: . Uncontrolled but improved; current treatment: [name] 3 mg weekly; [name] 10 mg daily o Metformin d/c d/t renal function  . [name] through [employer], [place] through [employer] assistance.  . Reports today that he lost the phone numbers to refill patient assistance medications. Provided for him today  HFrEF, (most recent EF 30-35%); s/p CRT-D  placement 2/28 . Appropriately managed; current treatment: furosemide 40 mg daily, carvedilol 6.25 mg BID, Entresto 24/26 mg BID; hydralazine 25 mg BID, follows w/ Dr. Fletcher Anon, Dr. Quentin Ore . Entresto through [name] assistance.  . Reports today that he lost the phone numbers to refill patient assistance medications. Provided for him today  Hyperlipidemia, secondary ASCVD prevention (hx MI w/ RCA stenting) . Controlled; current treatment: atorvastatin 40 mg daily . Antiplatelet regimen: clopidogrel 75 mg daily . Recommended to continue current regimen at this time  GERD: . Appropriately managed and controlled per patient report; current treatment: pantoprazole 40 mg BID . Continue  current regimen.  Patient Goals/Self-Care Activities . Over the next 90 days, patient will:  - take medications as prescribed check blood glucose twice daily, document, and provide at future appointments check blood pressure twice daily, document, and provide at future appointments collaborate with provider on medication access solutions  Follow Up Plan: Telephone follow up appointment with care management team member scheduled for: ~  2 weeks as previously scheduled       Medication Assistance: [name] obtained through [employer] medication assistance program.  Enrollment ends 02/08/21. Wilder Glade obtained through [name] medication assistance program.  Enrollment ends 02/08/21. Entresto obtained through [name] medication assistance program.  Enrollment ends 02/08/21  Patient's preferred pharmacy is:  [place], [place] - [place] [address] Phone: [phone] Fax: [phone]    Follow Up:  Patient agrees to Care Plan and Follow-up.  Plan: Telephone follow up appointment with care management team member scheduled for:  ~2 weeks as previously scheduled  [name], [name], [place], [place] Clinical Pharmacist [employer] at [facility] [phone]

## 2020-04-26 NOTE — Patient Instructions (Addendum)
[address] ([name]) - [phone] [place]) [phone] [employer] [name]) [phone]  Call [employer] TODAY and make sure and mention that you only have a week left of the Entresto.  [name], [name] [phone]  Visit Information  PATIENT GOALS: Goals Addressed              This Visit's Progress     Patient Stated   .  Medication Monitoring (pt-stated)        Patient Goals/Self-Care Activities . Over the next 90 days, patient will:  - take medications as prescribed check blood glucose twice daily, document, and provide at future appointments check blood pressure twice daily, document, and provide at future appointments collaborate with provider on medication access solutions       Print copy of patient instructions, educational materials, and care plan provided in person.  Plan: Telephone follow up appointment with care management team member scheduled for:  ~2 weeks as previously scheduled  [name], [name], [place], [place] Clinical Pharmacist [employer] at [facility] [phone]

## 2020-04-26 NOTE — Assessment & Plan Note (Signed)
Asymptomatic.  He will continue Protonix 40 mg twice daily.

## 2020-04-26 NOTE — Patient Instructions (Signed)
Nice to see you. We will see you back in about 2 months.

## 2020-04-26 NOTE — Assessment & Plan Note (Signed)
Slightly above goal in the morning though he has borderline low diastolic blood pressures later in the day.  This limits the ability to alter his regimen.  He will continue to monitor his blood pressure.

## 2020-04-26 NOTE — Assessment & Plan Note (Addendum)
Continue farxiga 10 mg once daily and [name] 3 mg once weekly.  Plan for A1c at next visit.  Patient defers nephropathy screening today given inability to urinate.

## 2020-04-26 NOTE — Assessment & Plan Note (Signed)
Appears to be euvolemic.  The patient will continue Entresto 1 tablet twice daily, hydralazine 25 mg twice daily, Lasix 40 mg daily, farxiga 10 mg daily, and carvedilol 6.25 mg twice daily.

## 2020-04-26 NOTE — Assessment & Plan Note (Signed)
No apparent recurrence.  Discussed yearly exam for monitoring.

## 2020-04-29 ENCOUNTER — Encounter: Payer: Self-pay | Admitting: *Deleted

## 2020-05-16 ENCOUNTER — Ambulatory Visit (INDEPENDENT_AMBULATORY_CARE_PROVIDER_SITE_OTHER): Payer: PPO | Admitting: Pharmacist

## 2020-05-16 DIAGNOSIS — I5022 Chronic systolic (congestive) heart failure: Secondary | ICD-10-CM

## 2020-05-16 DIAGNOSIS — I10 Essential (primary) hypertension: Secondary | ICD-10-CM

## 2020-05-16 DIAGNOSIS — E1142 Type 2 diabetes mellitus with diabetic polyneuropathy: Secondary | ICD-10-CM

## 2020-05-16 DIAGNOSIS — I25118 Atherosclerotic heart disease of native coronary artery with other forms of angina pectoris: Secondary | ICD-10-CM

## 2020-05-16 DIAGNOSIS — K279 Peptic ulcer, site unspecified, unspecified as acute or chronic, without hemorrhage or perforation: Secondary | ICD-10-CM

## 2020-05-16 DIAGNOSIS — N1832 Chronic kidney disease, stage 3b: Secondary | ICD-10-CM

## 2020-05-16 MED ORDER — NITROGLYCERIN 0.4 MG SL SUBL: 0.4000 mg | SUBLINGUAL_TABLET | SUBLINGUAL | 0 refills | Status: AC | PRN

## 2020-05-16 NOTE — Chronic Care Management (AMB) (Signed)
Chronic Care Management Pharmacy Note  05/16/2020 Name:  Devin Hanna MRN:  [id number] DOB:  1950/04/26  Subjective: Devin Hanna is an 70 y.o. year old male who is a primary patient of [name], Angela Adam, MD.  The CCM team was consulted for assistance with disease management and care coordination needs.    Engaged with patient by telephone for follow up visit in response to provider referral for pharmacy case management and/or care coordination services.   Consent to Services:  The patient was given information about Chronic Care Management services, agreed to services, and gave verbal consent prior to initiation of services.  Please see initial visit note for detailed documentation.   Patient Care Team: [name], MD as PCP - General (Family Medicine) [name], MD as PCP - Cardiology (Cardiology) Vickie Epley, MD as PCP - Electrophysiology (Cardiology) Clent Jacks, RN as Oncology Nurse Navigator [name], RPH-CPP (Pharmacist)  Recent office visits: None since our last call  Recent consult visits: None since our last call  Hospital visits: None in previous 6 months  Objective:  Lab Results  Component Value Date   CREATININE 1.37 (H) 04/04/2020   CREATININE 1.32 (H) 01/22/2020   CREATININE 1.83 (H) 09/29/2019    Lab Results  Component Value Date   HGBA1C 7.1 (H) 02/14/2020   Last diabetic Eye exam: No results found for: HMDIABEYEEXA  Last diabetic Foot exam: No results found for: HMDIABFOOTEX      Component Value Date/Time   CHOL 92 02/14/2020 0812   CHOL 103 06/07/2014 0824   TRIG 190.0 (H) 02/14/2020 0812   HDL 31.00 (L) 02/14/2020 0812   HDL 38 (L) 06/07/2014 0824   CHOLHDL 3 02/14/2020 0812   VLDL 38.0 02/14/2020 0812   LDLCALC 23 02/14/2020 0812   LDLCALC 59 03/30/2018 0930    Hepatic Function Latest Ref Rng & Units 01/22/2020 08/15/2019 07/30/2019  Total Protein 6.5 - 8.1 g/dL 7.1 6.8 6.7  Albumin 3.5 - 5.0  g/dL 3.5 3.7 3.3(L)  AST 15 - 41 U/L 52(H) 50(H) 60(H)  ALT 0 - 44 U/L 45(H) 32 36  Alk Phosphatase 38 - 126 U/L 134(H) 79 86  Total Bilirubin 0.3 - 1.2 mg/dL 0.8 1.2 1.7(H)  Bilirubin, Direct - - - -    Lab Results  Component Value Date/Time   TSH 0.889 09/09/2014 03:05 AM   TSH 3.056 04/04/2013 10:20 AM    CBC Latest Ref Rng & Units 04/04/2020 01/22/2020 08/15/2019  WBC 4.0 - 10.5 K/uL 7.9 6.6 6.8  Hemoglobin 13.0 - 17.0 g/dL 14.5 13.7 12.7(L)  Hematocrit 39.0 - 52.0 % 44.3 42.1 38.0(L)  Platelets 150 - 400 K/uL 187 184 161   Clinical ASCVD: Yes    Social History   Tobacco Use  Smoking Status Former Smoker  . Packs/day: 3.00  . Years: 40.00  . Pack years: 120.00  . Types: Cigarettes  . Quit date: 04/05/1995  . Years since quitting: 25.1  Smokeless Tobacco Former User  Tobacco Comment   started smoking at age 49   BP Readings from Last 3 Encounters:  04/26/20 120/80  04/08/20 (!) 138/57  03/15/20 108/66   Pulse Readings from Last 3 Encounters:  04/26/20 74  04/08/20 67  03/15/20 69   Wt Readings from Last 3 Encounters:  04/26/20 237 lb 9.6 oz (107.8 kg)  04/08/20 233 lb (105.7 kg)  03/15/20 240 lb (108.9 kg)    Assessment: Review of patient  past medical history, allergies, medications, health status, including review of consultants reports, laboratory and other test data, was performed as part of comprehensive evaluation and provision of chronic care management services.   SDOH:  (Social Determinants of Health) assessments and interventions performed:  SDOH Interventions   Flowsheet Row Most Recent Value  SDOH Interventions   Financial Strain Interventions Other (Comment)  [manufacturer assistance]      CCM Care Plan  No Known Allergies  Medications Reviewed Today    Reviewed by [name], RPH-CPP (Pharmacist) on 05/16/20 at 0910  Med List Status: <None>  Medication Order Taking? Sig Documenting Provider Last Dose Status Informant   atorvastatin (LIPITOR) 40 MG tablet [id number] Yes TAKE 1 TABLET BY MOUTH DAILY Theora Gianotti, NP Taking Active Child  carvedilol (COREG) 6.25 MG tablet [id number] Yes TAKE 1 TABLET BY MOUTH TWICE DAILY WITH A MEAL [name], MD Taking Active Child  CINNAMON PO [id number]  Take 1,000 mg by mouth in the morning and at bedtime. [provider]  Active Child  clopidogrel (PLAVIX) 75 MG tablet [id number] Yes TAKE ONE TABLET EVERY MORNING WITH BREAKFAST [name], NP Taking Active Child  dapagliflozin propanediol ([name]) 10 MG TABS tablet [id number] Yes Take 1 tablet (10 mg total) by mouth daily before breakfast. [name], MD Taking Active Child  Dulaglutide ([name]) 3 QX/4.5WT SOPN [id number] Yes Inject 3 mg as directed once a week.  Patient taking differently: Inject 3 mg as directed once a week. Wednesday   [name], MD Taking Active Child  ferrous sulfate 325 (65 FE) MG tablet [id number] Yes Take 325 mg by mouth daily with breakfast. [provider] Taking Active Child           Med Note Darnelle Maffucci, Chizara Mena E   Thu May 16, 2020  9:08 AM) 1/2 tablet every other day  furosemide (LASIX) 40 MG tablet [id number] Yes Take 1 tablet (40 mg total) by mouth daily. [name], NP Taking Active Child  gabapentin (NEURONTIN) 300 MG capsule [id number] Yes TAKE 1 CAPSULE BY MOUTH 3 TIMES DAILY [name], MD Taking Active Child  hydrALAZINE (APRESOLINE) 25 MG tablet [id number] Yes TAKE ONE TABLET EACH MORNING AND AT BEDTIME [name], MD Taking Active Child  Multiple Vitamin (MULTIVITAMIN WITH MINERALS) TABS [id number] Yes Take 1 tablet by mouth daily. Multivitamin for Men 50+ [provider] Taking Active Child  nitroGLYCERIN (NITROSTAT) 0.4 MG SL tablet [id number] No Place 1 tablet (0.4 mg total) under the tongue every 5 (five) minutes as needed for chest pain.  Patient not taking: Reported on 05/16/2020   [name],  MD Not Taking Active Child  pantoprazole (PROTONIX) 40 MG tablet [id number] Yes TAKE ONE TABLET TWICE DAILY [name], NP Taking Active Child  sacubitril-valsartan (ENTRESTO) 24-26 MG [id number] Yes Take 1 tablet by mouth 2 (two) times daily. [name], MD Taking Active           Patient Active Problem List   Diagnosis Date Noted  . Cough 01/11/2020  . Left knee pain 01/11/2020  . Abnormal cardiovascular stress test   . Acute on chronic systolic heart failure ([place]) 07/30/2019  . AK (actinic keratosis) 04/03/2019  . Colon cancer screening   . Rectal polyp   . Polyp of transverse colon   . Liver lesion 12/06/2018  . Chronic kidney disease (CKD), stage III (moderate) ([employer]) 12/06/2018  . Abrasion 12/06/2018  . History of anemia 03/30/2018  .  Hyperpigmented skin lesion 12/20/2017  . Bilateral foot pain 12/20/2017  . DDD (degenerative disc disease), lumbar 08/18/2017  . SCC (squamous cell carcinoma), trunk 05/17/2017  . Venous insufficiency 02/12/2017  . Decreased pedal pulses 02/12/2017  . Skin lesion 02/12/2017  . Neuropathy 01/09/2016  . Elevated LFTs 06/28/2015  . History of penile cancer 06/28/2015  . Peptic ulcer disease 09/12/2014  . Erectile dysfunction 09/07/2013  . Chronic systolic heart failure ([place]) 05/15/2013  . Hyperlipidemia 05/15/2013  . DM type 2 (diabetes mellitus, type 2) ([place]) 04/07/2013  . Other and unspecified hyperlipidemia 04/07/2013  . Morbid obesity ([place]) 04/07/2013  . HTN (hypertension) 04/07/2013  . CAD (coronary artery disease) 04/04/2013    Immunization History  Administered Date(s) Administered  . Fluad Quad(high Dose 65+) 10/26/2018, 01/11/2020  . Influenza, High Dose Seasonal PF 01/09/2016, 10/27/2016, 12/20/2017  . Pneumococcal Conjugate-13 01/09/2016  . Pneumococcal Polysaccharide-23 09/09/2014  . Tdap 01/21/2018    Conditions to be addressed/monitored: CHF, CAD, HTN, HLD, DMII and CKD  Care Plan : Medication Management   Updates made by [name], RPH-CPP since 05/16/2020 12:00 AM    Problem: Diabetes, CHF, HTN     Long-Range Goal: Disease Progression Prevention   This Visit's Progress: On track  Recent Progress: On track  Priority: High  Note:   Current Barriers:  . Unable to independently afford treatment regimen . Unable to achieve control of diabetes  . Complex patient with multiple comorbidities including HFrEF, CAD, HLD  Pharmacist Clinical Goal(s):  Marland Kitchen Over the next 90 days, patient will verbalize ability to afford treatment regimen. . Over the next 90 days, patient will achieve adherence to monitoring guidelines and medication adherence to achieve therapeutic efficacy. . Over the next 90 days, patient will achieve control of diabetes as evidenced by improvement in A1c  Interventions: . 1:1 collaboration with [name], MD regarding development and update of comprehensive plan of care as evidenced by provider attestation and co-signature . Inter-disciplinary care team collaboration (see longitudinal plan of care) . Comprehensive medication review performed; medication list updated in electronic medical record  Health Maintenance: . Due for eye exam, foot exam. PCP visit upcoming to incorporate foot exam. Discussed need for eye exam. Patient notes he will schedule . Declines COVID vaccine. Encouraged to let me know if he changes his mind in the future.   Diabetes: . Uncontrolled but improved, relaxed goal of A1c <7.5% likely appropriate given age, comorbidities; current treatment: [name] 3 mg weekly; [name] 10 mg daily o Metformin d/c d/t renal function  . Current glucose readings:   Fasting Evening  23-Mar 126 131  24-Mar 137 110  25-Mar 131 155  26-Mar 135 132  27-Mar 172 186  28-Mar 144 155  29-Mar 146 157  30-Mar 147 193  31-Mar 151 144  1-Apr 125 170  2-Apr 175 145  3-Apr 132 140  4-Apr 121 155  5-Apr 156 127  6-Apr 140 185  AVG 143 152   . Current  meal patterns: breakfast: scrambled egg, sometimes piece of sausage; lunch: 1-2 vegetables; supper: vegetables, sometimes proteins; drinks: water, koolaid w/ sweet and low occasionally;  . Continue current regimen at this time . Discussed CGM, as patient's insurance covers [place]. Patient not interested at this time. Would discuss again if next A1c is not within goal.  . Patient notes he is tired of checking fingersticks BID. Given stability on this regimen, can reduce to checking 3-5 times weekly and alternating between fasting and post prandial.  Increase frequency if sugars trend up. Patient aware  HFrEF, (most recent EF 30-35%); s/p CRT-D placement 2/28 . Appropriately managed; current treatment: furosemide 40 mg daily, carvedilol 6.25 mg BID, Entresto 24/26 mg BID; hydralazine 25 mg BID, follows w/ Dr. Fletcher Anon, Dr. Quentin Ore . Denies any episodes of lightheadedness, dizziness, hypotension . Home weights: 130-134 lbs . Home BP readings:   AM SBP AM DBP AM HR PM SBP PM DBP PM HR  26-Mar 127 72 70     27-Mar 142 67 72     28-Mar 161 66 77     29-Mar 146 74 73 144 68 75  30-Mar 140 74 78     31-Mar 127 66 73     1-Apr 132 61 71 133 71 73  2-Apr 140 61 71     3-Apr 140 71 73     4-Apr 141 76 72     5-Apr 144 71 65     6-Apr 139 70 68     7-Apr 139 72 70     AVG 140 69 72 138 70 74   . Entresto through [name] assistance through 2022. Marland Kitchen Recommend to continue current regimen at this time. Continue home daily weights and BP monitoring.  . Reviewed to contact cardiology if weight gain >3 lbs/day or > 5 lbs/week  Hyperlipidemia, secondary ASCVD prevention (hx MI w/ RCA stenting) . Controlled per last lipid panel; current treatment: atorvastatin 40 mg daily . Notes he has never used nitroglycerin. Last filled in 2018, likely expired.  . Antiplatelet regimen: clopidogrel 75 mg daily . Recommended to continue current regimen at this time. Discussed need to keep in-date nitroglycerin. Patient  amenable to [place] sending script today for him to replace current nitroglycerin. Patient amenable. Reviewed admin instructions.   GERD/hx peptic ulcer disease: . Appropriately managed and controlled per patient report; current treatment: pantoprazole 40 mg BID . Continue current regimen.  Supplement: Cinnamon  Patient Goals/Self-Care Activities . Over the next 90 days, patient will:  - take medications as prescribed check glucose twice daily, document, and provide at future appointments check blood pressure twice daily, document, and provide at future appointments weigh daily, and contact provider if weight gain of greater than 3 lbs in a day or greater than 5 lbs in a week collaborate with provider on medication access solutions  Follow Up Plan: Telephone follow up appointment with care management team member scheduled for: ~  10 weeks       Medication Assistance: Wilder Glade, [name] obtained through [name], [place] medication assistance program.  Enrollment ends 02/08/21  Patient's preferred pharmacy is:  [facility], [place] - [address] [place] [address] Phone: [phone] Fax: [phone]  [facility], [place] - [address] [place] [address] Phone: [phone] Fax: [phone]  Uses pill box? Yes Pt endorses 100% compliance  Follow Up:  Patient agrees to Care Plan and Follow-up.  Plan: Telephone follow up appointment with care management team member scheduled for:  ~ 10 weeks  [name], [name], [place], [place] Clinical Pharmacist [employer] at [employer] [phone]

## 2020-05-16 NOTE — Addendum Note (Signed)
Addended by: [name] on: 05/16/2020 09:58 AM   Modules accepted: Orders

## 2020-05-16 NOTE — Patient Instructions (Signed)
Mr. Butrick,   It was great talking with you today! Keep up the great work.   You can reduce your blood sugar checks to 3-5 times weekly, alternating between fasting and about 2 hours after a meal. We want to keep your fasting readings around 140 and 2 hour after meal readings less than 180. If you see readings start to creep up, please increase the frequency of your blood sugar checks to keep an eye on things.   We also talked about the FreeStyle Libre Continuous Glucose Monitor today. This is covered for free with [employer]. Let me know if you change your mind and are interested in the future.   Please schedule your yearly diabetic eye exam and have the eye doctor fax the results to our office.   Call me with any questions or concerns!  [name], [name] [phone]  Visit Information  PATIENT GOALS: Goals Addressed              This Visit's Progress     Patient Stated   .  Medication Monitoring (pt-stated)        Patient Goals/Self-Care Activities . Over the next 90 days, patient will:  - take medications as prescribed check glucose twice daily, document, and provide at future appointments check blood pressure twice daily, document, and provide at future appointments weigh daily, and contact provider if weight gain of greater than 3 lbs in a day or greater than 5 lbs in a week collaborate with provider on medication access solutions       The patient verbalized understanding of instructions, educational materials, and care plan provided today and agreed to receive a mailed copy of patient instructions, educational materials, and care plan.   Plan: Telephone follow up appointment with care management team member scheduled for:  ~ 10 weeks  [name], [name], [place], [place] Clinical Pharmacist [employer] at [employer] [phone]

## 2020-06-18 ENCOUNTER — Other Ambulatory Visit: Payer: Self-pay | Admitting: Cardiovascular Disease

## 2020-06-18 ENCOUNTER — Other Ambulatory Visit: Payer: Self-pay | Admitting: Family

## 2020-06-18 ENCOUNTER — Other Ambulatory Visit: Payer: Self-pay | Admitting: Family Medicine

## 2020-06-18 DIAGNOSIS — I10 Essential (primary) hypertension: Secondary | ICD-10-CM

## 2020-06-19 ENCOUNTER — Encounter: Payer: PPO | Admitting: Cardiology

## 2020-06-25 ENCOUNTER — Telehealth: Payer: Self-pay | Admitting: *Deleted

## 2020-06-25 DIAGNOSIS — E1142 Type 2 diabetes mellitus with diabetic polyneuropathy: Secondary | ICD-10-CM

## 2020-06-25 DIAGNOSIS — E785 Hyperlipidemia, unspecified: Secondary | ICD-10-CM

## 2020-06-25 NOTE — Telephone Encounter (Signed)
Please place future orders for lab appt.  

## 2020-06-26 ENCOUNTER — Other Ambulatory Visit: Payer: Self-pay

## 2020-06-26 ENCOUNTER — Other Ambulatory Visit (INDEPENDENT_AMBULATORY_CARE_PROVIDER_SITE_OTHER): Payer: PPO

## 2020-06-26 DIAGNOSIS — E785 Hyperlipidemia, unspecified: Secondary | ICD-10-CM

## 2020-06-26 DIAGNOSIS — E1142 Type 2 diabetes mellitus with diabetic polyneuropathy: Secondary | ICD-10-CM

## 2020-06-26 LAB — LIPID PANEL
Cholesterol: 103 mg/dL (ref 0–200)
HDL: 34.5 mg/dL — ABNORMAL LOW (ref >39.00)
LDL Cholesterol: 37 mg/dL (ref 0–99)
NonHDL: 68.99
Total CHOL/HDL Ratio: 3
Triglycerides: 159 mg/dL — ABNORMAL HIGH (ref 0.0–149.0)
VLDL: 31.8 mg/dL (ref 0.0–40.0)

## 2020-06-26 LAB — MICROALBUMIN / CREATININE URINE RATIO
Creatinine,U: 29.3 mg/dL
Microalb Creat Ratio: 2.4 mg/g (ref 0.0–30.0)
Microalb, Ur: <0.7 mg/dL (ref 0.0–1.9)

## 2020-06-26 LAB — HEMOGLOBIN A1C: Hgb A1c MFr Bld: 6.3 % (ref 4.6–6.5)

## 2020-07-02 ENCOUNTER — Ambulatory Visit (INDEPENDENT_AMBULATORY_CARE_PROVIDER_SITE_OTHER): Payer: PPO | Admitting: Family Medicine

## 2020-07-02 ENCOUNTER — Other Ambulatory Visit: Payer: Self-pay

## 2020-07-02 ENCOUNTER — Encounter: Payer: Self-pay | Admitting: Family Medicine

## 2020-07-02 DIAGNOSIS — E1142 Type 2 diabetes mellitus with diabetic polyneuropathy: Secondary | ICD-10-CM

## 2020-07-02 DIAGNOSIS — M25562 Pain in left knee: Secondary | ICD-10-CM

## 2020-07-02 DIAGNOSIS — G8929 Other chronic pain: Secondary | ICD-10-CM

## 2020-07-02 DIAGNOSIS — E785 Hyperlipidemia, unspecified: Secondary | ICD-10-CM | POA: Diagnosis not present

## 2020-07-02 DIAGNOSIS — I5022 Chronic systolic (congestive) heart failure: Secondary | ICD-10-CM

## 2020-07-02 NOTE — Progress Notes (Signed)
Devin Rumps, MD Phone: [phone]  Devin Hanna is a 70 y.o. male who presents today for f/u.  DIABETES Disease Monitoring: Blood Sugar ranges-120-160, A1c 6.3 Polyuria/phagia/dipsia- no      Optho- UTD Medications: Compliance- taking farxiga, trulicity Hypoglycemic symptoms- no  HYPERLIPIDEMIA Symptoms Chest pain on exertion:  no   Leg claudication:   no Medications: Compliance- taking lipitor Right upper quadrant pain- no  Muscle aches- no  Left knee popping: Patient notes occasional left knee pain.  Notes it seems to grind and pop anytime he gets up.  Does not give out on him.  Does not lock up on him.  No injury.  Feet swelling: This is been going on about a week.  His right foot is swollen more than his left foot.  He notes they do go down slightly overnight.  No injury.  No pain associated with this.  He notes on 3 occasions he took an extra 20 mg of Lasix in the afternoon and that did help with the swelling.  He notes no orthopnea or PND.  No shortness of breath.  He has been taking Lasix 40 mg daily.  He notes no increase in salt intake.   Social History   Tobacco Use  Smoking Status Former Smoker  . Packs/day: 3.00  . Years: 40.00  . Pack years: 120.00  . Types: Cigarettes  . Quit date: 04/05/1995  . Years since quitting: 25.2  Smokeless Tobacco Former Systems developer  Tobacco Comment   started smoking at age 33    Current Outpatient Medications on File Prior to Visit  Medication Sig Dispense Refill  . atorvastatin (LIPITOR) 40 MG tablet TAKE 1 TABLET BY MOUTH DAILY 90 tablet 1  . carvedilol (COREG) 6.25 MG tablet Take 1 tablet (6.25 mg total) by mouth 2 (two) times daily with a meal. 60 tablet 0  . CINNAMON PO Take 1,000 mg by mouth in the morning and at bedtime.    . clopidogrel (PLAVIX) 75 MG tablet TAKE ONE TABLET EVERY MORNING WITH BREAKFAST 90 tablet 3  . dapagliflozin propanediol ([name]) 10 MG TABS tablet Take 1 tablet (10 mg total) by mouth daily before  breakfast. 30 tablet 3  . Dulaglutide ([name]) 3 VZ/8.5YI SOPN Inject 3 mg as directed once a week. (Patient taking differently: Inject 3 mg as directed once a week. Wednesday) 6 mL 0  . ferrous sulfate 325 (65 FE) MG tablet Take 325 mg by mouth daily with breakfast.    . furosemide (LASIX) 40 MG tablet Take 1 tablet (40 mg total) by mouth daily. 90 tablet 3  . gabapentin (NEURONTIN) 300 MG capsule TAKE 1 CAPSULE BY MOUTH 3 TIMES DAILY 90 capsule 3  . hydrALAZINE (APRESOLINE) 25 MG tablet TAKE ONE TABLET EACH MORNING AND AT BEDTIME 60 tablet 2  . Multiple Vitamin (MULTIVITAMIN WITH MINERALS) TABS Take 1 tablet by mouth daily. Multivitamin for Men 47+    . nitroGLYCERIN (NITROSTAT) 0.4 MG SL tablet Place 1 tablet (0.4 mg total) under the tongue every 5 (five) minutes as needed for chest pain. 25 tablet 0  . pantoprazole (PROTONIX) 40 MG tablet TAKE ONE TABLET TWICE DAILY 180 tablet 1  . sacubitril-valsartan (ENTRESTO) 24-26 MG Take 1 tablet by mouth 2 (two) times daily. 180 tablet 3   No current facility-administered medications on file prior to visit.     ROS see history of present illness  Objective  Physical Exam Vitals:   07/02/20 1146  BP: 130/80  Pulse: 70  Temp: 98.5 F (36.9 C)  SpO2: 99%    BP Readings from Last 3 Encounters:  07/02/20 130/80  04/26/20 120/80  04/08/20 (!) 138/57   Wt Readings from Last 3 Encounters:  07/02/20 236 lb 3.2 oz (107.1 kg)  04/26/20 237 lb 9.6 oz (107.8 kg)  04/08/20 233 lb (105.7 kg)    Physical Exam Constitutional:      General: He is not in acute distress.    Appearance: He is not diaphoretic.  Cardiovascular:     Rate and Rhythm: Normal rate and regular rhythm.     Heart sounds: Normal heart sounds.  Pulmonary:     Effort: Pulmonary effort is normal.     Breath sounds: Normal breath sounds.  Musculoskeletal:     Comments: 2+ pitting edema right lower extremity to the lower shin, 1+ pitting edema left lower extremity to the  lower shin, left knee with no swelling or tenderness, negative McMurray's  Skin:    General: Skin is warm and dry.  Neurological:     Mental Status: He is alert.      Assessment/Plan: Please see individual problem list.  Problem List Items Addressed This Visit    DM type 2 (diabetes mellitus, type 2) ([place])    Very well controlled.  He will continue farxiga 10 mg once daily and [name] 3 mg once weekly.      Chronic systolic heart failure ([place])    Patient with some increased swelling recently possibly related to fluid accumulation related to his heart failure.  We will have him take Lasix 40 mg in the morning and 20 mg in the evening to see if this will help with the swelling.  He will do this for 3 days and then he will return to the Lasix 40 mg daily dosing.  He will have lab work on Friday.  He was advised to seek medical attention for shortness of breath, orthopnea, or PND.  We will check other lab work to evaluate for other causes of the swelling as well.      Relevant Orders   Comp Met (CMET)   CBC   TSH   Hyperlipidemia    Adequately controlled.  Continue Lipitor 40 mg once daily.      Left knee pain    Likely osteoarthritis.  We will get an x-ray Friday to evaluate.  He does not want to see a specialist for this currently though he will let us know in the future when he would like to see 1.      Relevant Orders   DG Knee Complete 4 Views Left      Return in about 3 days (around 07/05/2020) for Labs and x-ray, 3 months PCP.  This visit occurred during the SARS-CoV-2 public health emergency.  Safety protocols were in place, including screening questions prior to the visit, additional usage of staff PPE, and extensive cleaning of exam room while observing appropriate contact time as indicated for disinfecting solutions.    Devin Rumps, MD [place]

## 2020-07-02 NOTE — Assessment & Plan Note (Signed)
Likely osteoarthritis.  We will get an x-ray Friday to evaluate.  He does not want to see a specialist for this currently though he will let us know in the future when he would like to see 1.

## 2020-07-02 NOTE — Assessment & Plan Note (Signed)
Patient with some increased swelling recently possibly related to fluid accumulation related to his heart failure.  We will have him take Lasix 40 mg in the morning and 20 mg in the evening to see if this will help with the swelling.  He will do this for 3 days and then he will return to the Lasix 40 mg daily dosing.  He will have lab work on Friday.  He was advised to seek medical attention for shortness of breath, orthopnea, or PND.  We will check other lab work to evaluate for other causes of the swelling as well.

## 2020-07-02 NOTE — Assessment & Plan Note (Signed)
Adequately controlled.  Continue Lipitor 40 mg once daily.

## 2020-07-02 NOTE — Assessment & Plan Note (Signed)
Very well controlled.  He will continue farxiga 10 mg once daily and [name] 3 mg once weekly.

## 2020-07-02 NOTE — Patient Instructions (Signed)
Nice to see you. We will get labs and an x-ray on Friday. If you develop shortness of breath please seek medical attention immediately. You will take an extra 20 mg of Lasix in the afternoon to help with your swelling.

## 2020-07-05 ENCOUNTER — Ambulatory Visit (INDEPENDENT_AMBULATORY_CARE_PROVIDER_SITE_OTHER): Payer: PPO

## 2020-07-05 ENCOUNTER — Other Ambulatory Visit (INDEPENDENT_AMBULATORY_CARE_PROVIDER_SITE_OTHER): Payer: PPO

## 2020-07-05 ENCOUNTER — Other Ambulatory Visit: Payer: Self-pay

## 2020-07-05 DIAGNOSIS — M25562 Pain in left knee: Secondary | ICD-10-CM

## 2020-07-05 DIAGNOSIS — G8929 Other chronic pain: Secondary | ICD-10-CM

## 2020-07-05 DIAGNOSIS — I5022 Chronic systolic (congestive) heart failure: Secondary | ICD-10-CM | POA: Diagnosis not present

## 2020-07-05 NOTE — Addendum Note (Signed)
Addended by: Leeanne Rio on: 07/05/2020 03:03 PM   Modules accepted: Orders

## 2020-07-06 LAB — CBC
Hematocrit: 43 % (ref 37.5–51.0)
Hemoglobin: 14.8 g/dL (ref 13.0–17.7)
MCH: 32.2 pg (ref 26.6–33.0)
MCHC: 34.4 g/dL (ref 31.5–35.7)
MCV: 94 fL (ref 79–97)
Platelets: 201 10*3/uL (ref 150–450)
RBC: 4.59 x10E6/uL (ref 4.14–5.80)
RDW: 12.2 % (ref 11.6–15.4)
WBC: 6.9 10*3/uL (ref 3.4–10.8)

## 2020-07-06 LAB — TSH: TSH: 3.42 u[IU]/mL (ref 0.450–4.500)

## 2020-07-06 LAB — COMPREHENSIVE METABOLIC PANEL
ALT: 36 IU/L (ref 0–44)
AST: 40 IU/L (ref 0–40)
Albumin/Globulin Ratio: 1.4 (ref 1.2–2.2)
Albumin: 3.8 g/dL (ref 3.7–4.7)
Alkaline Phosphatase: 165 IU/L — ABNORMAL HIGH (ref 44–121)
BUN/Creatinine Ratio: 15 (ref 10–24)
BUN: 21 mg/dL (ref 8–27)
Bilirubin Total: 0.6 mg/dL (ref 0.0–1.2)
CO2: 23 mmol/L (ref 20–29)
Calcium: 9.3 mg/dL (ref 8.6–10.2)
Chloride: 100 mmol/L (ref 96–106)
Creatinine, Ser: 1.4 mg/dL — ABNORMAL HIGH (ref 0.76–1.27)
Globulin, Total: 2.8 g/dL (ref 1.5–4.5)
Glucose: 130 mg/dL — ABNORMAL HIGH (ref 65–99)
Potassium: 4.4 mmol/L (ref 3.5–5.2)
Sodium: 139 mmol/L (ref 134–144)
Total Protein: 6.6 g/dL (ref 6.0–8.5)
eGFR: 53 mL/min/{1.73_m2} — ABNORMAL LOW (ref >59)

## 2020-07-09 ENCOUNTER — Ambulatory Visit (INDEPENDENT_AMBULATORY_CARE_PROVIDER_SITE_OTHER): Payer: PPO

## 2020-07-09 ENCOUNTER — Other Ambulatory Visit: Payer: Self-pay | Admitting: Family Medicine

## 2020-07-09 DIAGNOSIS — I5022 Chronic systolic (congestive) heart failure: Secondary | ICD-10-CM | POA: Diagnosis not present

## 2020-07-09 DIAGNOSIS — R748 Abnormal levels of other serum enzymes: Secondary | ICD-10-CM

## 2020-07-09 LAB — CUP PACEART REMOTE DEVICE CHECK
Battery Remaining Longevity: 64 mo
Battery Remaining Percentage: 94 %
Battery Voltage: 3.01 V
Brady Statistic AP VP Percent: 6 %
Brady Statistic AP VS Percent: 1 %
Brady Statistic AS VP Percent: 91 %
Brady Statistic AS VS Percent: 1.7 %
Brady Statistic RA Percent Paced: 4.6 %
Date Time Interrogation Session: 20220531035757
HighPow Impedance: 71 Ohm
Implantable Lead Implant Date: 20220228
Implantable Lead Implant Date: 20220228
Implantable Lead Implant Date: 20220228
Implantable Lead Location: 753858
Implantable Lead Location: 753859
Implantable Lead Location: 753860
Implantable Pulse Generator Implant Date: 20220228
Lead Channel Impedance Value: 430 Ohm
Lead Channel Impedance Value: 600 Ohm
Lead Channel Impedance Value: 960 Ohm
Lead Channel Pacing Threshold Amplitude: 0.75 V
Lead Channel Pacing Threshold Amplitude: 0.75 V
Lead Channel Pacing Threshold Amplitude: 1.25 V
Lead Channel Pacing Threshold Pulse Width: 0.5 ms
Lead Channel Pacing Threshold Pulse Width: 0.5 ms
Lead Channel Pacing Threshold Pulse Width: 0.5 ms
Lead Channel Sensing Intrinsic Amplitude: 12 mV
Lead Channel Sensing Intrinsic Amplitude: 4.2 mV
Lead Channel Setting Pacing Amplitude: 3.5 V
Lead Channel Setting Pacing Amplitude: 3.5 V
Lead Channel Setting Pacing Amplitude: 3.5 V
Lead Channel Setting Pacing Pulse Width: 0.5 ms
Lead Channel Setting Pacing Pulse Width: 0.5 ms
Lead Channel Setting Sensing Sensitivity: 0.5 mV
Pulse Gen Serial Number: 810018869

## 2020-07-09 NOTE — Progress Notes (Signed)
Left message for patient to return call back for xray results.

## 2020-07-10 ENCOUNTER — Telehealth: Payer: Self-pay | Admitting: Family Medicine

## 2020-07-10 ENCOUNTER — Encounter: Payer: Self-pay | Admitting: Cardiology

## 2020-07-10 ENCOUNTER — Other Ambulatory Visit: Payer: Self-pay

## 2020-07-10 ENCOUNTER — Ambulatory Visit (INDEPENDENT_AMBULATORY_CARE_PROVIDER_SITE_OTHER): Payer: PPO | Admitting: Cardiology

## 2020-07-10 VITALS — BP 110/68 | HR 69 | Ht 70.0 in | Wt 233.0 lb

## 2020-07-10 DIAGNOSIS — Z95810 Presence of automatic (implantable) cardiac defibrillator: Secondary | ICD-10-CM | POA: Diagnosis not present

## 2020-07-10 DIAGNOSIS — I5022 Chronic systolic (congestive) heart failure: Secondary | ICD-10-CM

## 2020-07-10 NOTE — Patient Instructions (Addendum)
Medication Instructions:  Your physician recommends that you continue on your current medications as directed. Please refer to the Current Medication list given to you today. *If you need a refill on your cardiac medications before your next appointment, please call your pharmacy*  Lab Work: None ordered. If you have labs (blood work) drawn today and your tests are completely normal, you will receive your results only by: Marland Kitchen MyChart Message (if you have MyChart) OR . A paper copy in the mail If you have any lab test that is abnormal or we need to change your treatment, we will call you to review the results.  Testing/Procedures: Your physician has requested that you have an echocardiogram. Echocardiography is a painless test that uses sound waves to create images of your heart. It provides your doctor with information about the size and shape of your heart and how well your heart's chambers and valves are working. This procedure takes approximately one hour. There are no restrictions for this procedure.  Please schedule for ECHO  Follow-Up: At [facility], you and your health needs are our priority.  As part of our continuing mission to provide you with exceptional heart care, we have created designated Provider Care Teams.  These Care Teams include your primary Cardiologist (physician) and Advanced Practice Providers (APPs -  Physician Assistants and Nurse Practitioners) who all work together to provide you with the care you need, when you need it.  Your next appointment:   Your physician wants you to follow-up in: 9 months with Dr. Quentin Ore.   You will receive a reminder letter in the mail two months in advance. If you don't receive a letter, please call our office to schedule the follow-up appointment.  Remote monitoring is used to monitor your ICD from home. This monitoring reduces the number of office visits required to check your device to one time per year. It allows [place] to keep an eye  on the functioning of your device to ensure it is working properly. You are scheduled for a device check from home on 10/08/2020. You may send your transmission at any time that day. If you have a wireless device, the transmission will be sent automatically. After your physician reviews your transmission, you will receive a postcard with your next transmission date.

## 2020-07-10 NOTE — Telephone Encounter (Signed)
Patient's daughter returned [place] phone call from yesterday.

## 2020-07-10 NOTE — Progress Notes (Signed)
Electrophysiology Office Follow up Visit Note:    Date:  07/10/2020   ID:  Devin Hanna, DOB 26-Jan-1951, MRN [id number]  PCP:  [name], MD  [facility] Cardiologist:  [name], MD  [facility] Electrophysiologist:  Vickie Epley, MD    Interval History:    Mayfield Schoene Withey is a 70 y.o. male who presents for a follow up visit after his BiV ICD implant on April 08, 2020.  He is healed well after the implant.  Device interrogations have shown an acceptable amount of biventricular pacing.  He tells me he is feeling overall well.  No syncope or presyncope.     Past Medical History:  Diagnosis Date  . Acute respiratory failure with hypoxia ([place]) 06/2016  . CAD (coronary artery disease)    a. Remote PCI of the RCA;  b. 03/2013 Inf STEMI/PCI: LAD 60-70d, RCA 99p ISR(rota/CBA/DES);  c. 09/2014 MV: inf and infsept infarct, EF 35-40%;  d. 09/2014 Cath: patent RCA stent, stable LAD dzs, EF 40%-->Med rx.  . CHF (congestive heart failure) ([place])   . Diabetes mellitus    sees Dr. Hardin Negus in [place]  . GERD (gastroesophageal reflux disease)   . HFrEF (heart failure with reduced ejection fraction) ([place])   . Hyperlipidemia   . Hypertension   . Ischemic cardiomyopathy    a. 09/2014 EF 40% by V gram;  b. 06/2016 Echo: EF 40, diff HK, Gr1 DD, inf HK, no mural thrombus. Mild MR, mildly dil LA, mildly dil RV.   . LBBB (left bundle branch block)    Old  . Liver mass   . Morbid obesity ([place])   . Peripheral neuropathy   . Upper GI bleed   . Vertigo     Past Surgical History:  Procedure Laterality Date  . BIV ICD INSERTION CRT-D N/A 04/08/2020   Procedure: BIV ICD INSERTION CRT-D;  Surgeon: Vickie Epley, MD;  Location: [place] CV LAB;  Service: Cardiovascular;  Laterality: N/A;  . CARDIAC CATHETERIZATION N/A 10/03/2014   Procedure: Left Heart Cath and Coronary Angiography;  Surgeon: [name], MD;  Location: [place] CV LAB;  Service: Cardiovascular;   Laterality: N/A;  . CIRCUMCISION N/A 07/22/2015   Procedure: CIRCUMCISION ADULT;  Surgeon: Hollice Espy, MD;  Location: [facility] ORS;  Service: Urology;  Laterality: N/A;  . COLONOSCOPY WITH PROPOFOL N/A 01/12/2019   Procedure: COLONOSCOPY WITH PROPOFOL;  Surgeon: Lucilla Lame, MD;  Location: [facility] ENDOSCOPY;  Service: Endoscopy;  Laterality: N/A;  . CORONARY STENT PLACEMENT    . ESOPHAGOGASTRODUODENOSCOPY (EGD) WITH PROPOFOL N/A 09/12/2014   Procedure: ESOPHAGOGASTRODUODENOSCOPY (EGD) WITH PROPOFOL;  Surgeon: Ronald Lobo, MD;  Location: [facility] ENDOSCOPY;  Service: Endoscopy;  Laterality: N/A;  . EXTERNAL FIXATION LEG Left 04/03/2012   Procedure: EXTERNAL FIXATION LEG;  Surgeon: Wylene Simmer, MD;  Location: [place];  Service: Orthopedics;  Laterality: Left;  . EXTERNAL FIXATION REMOVAL Left 04/14/2012   Procedure: REMOVAL EXTERNAL FIXATION LEG;  Surgeon: Wylene Simmer, MD;  Location: [place];  Service: Orthopedics;  Laterality: Left;  . IRRIGATION AND DEBRIDEMENT KNEE Left 04/03/2012   Procedure: IRRIGATION AND DEBRIDEMENT KNEE;  Surgeon: Wylene Simmer, MD;  Location: [place];  Service: Orthopedics;  Laterality: Left;  . LEFT HEART CATHETERIZATION WITH CORONARY ANGIOGRAM N/A 04/05/2013   Procedure: LEFT HEART CATHETERIZATION WITH CORONARY ANGIOGRAM;  Surgeon: [name], MD;  Location: [place] CATH LAB;  Service: Cardiovascular;  Laterality: N/A;  . MASS EXCISION N/A 07/22/2015   Procedure: EXCISION  PENILE MASS;  Surgeon: Hollice Espy, MD;  Location: [facility] ORS;  Service: Urology;  Laterality: N/A;  . ORIF TIBIA PLATEAU Left 04/14/2012   Procedure: OPEN REDUCTION INTERNAL FIXATION (ORIF) TIBIAL PLATEAU;  Surgeon: Wylene Simmer, MD;  Location: [place];  Service: Orthopedics;  Laterality: Left;  . PERCUTANEOUS CORONARY ROTOBLATOR INTERVENTION (PCI-R) N/A 04/06/2013   Procedure: PERCUTANEOUS CORONARY ROTOBLATOR INTERVENTION (PCI-R);  Surgeon: [name], MD;  Location: [facility] CATH LAB;  Service: Cardiovascular;  Laterality:  N/A;  . RIGHT/LEFT HEART CATH AND CORONARY ANGIOGRAPHY N/A 08/21/2019   Procedure: RIGHT/LEFT HEART CATH AND CORONARY ANGIOGRAPHY;  Surgeon: [name], MD;  Location: [place] CV LAB;  Service: Cardiovascular;  Laterality: N/A;    Current Medications: Current Meds  Medication Sig  . atorvastatin (LIPITOR) 40 MG tablet TAKE 1 TABLET BY MOUTH DAILY  . carvedilol (COREG) 6.25 MG tablet Take 1 tablet (6.25 mg total) by mouth 2 (two) times daily with a meal.  . CINNAMON PO Take 1,000 mg by mouth in the morning and at bedtime.  . clopidogrel (PLAVIX) 75 MG tablet TAKE ONE TABLET EVERY MORNING WITH BREAKFAST  . dapagliflozin propanediol ([name]) 10 MG TABS tablet Take 1 tablet (10 mg total) by mouth daily before breakfast.  . Dulaglutide ([name]) 3 QM/0.8QP SOPN Inject 3 mg as directed once a week. (Patient taking differently: Inject 3 mg as directed once a week. Wednesday)  . ferrous sulfate 325 (65 FE) MG tablet Take 162.5 mg by mouth daily with breakfast.  . furosemide (LASIX) 40 MG tablet Take 1 tablet (40 mg total) by mouth daily.  Marland Kitchen gabapentin (NEURONTIN) 300 MG capsule TAKE 1 CAPSULE BY MOUTH 3 TIMES DAILY  . hydrALAZINE (APRESOLINE) 25 MG tablet TAKE ONE TABLET EACH MORNING AND AT BEDTIME  . Multiple Vitamin (MULTIVITAMIN WITH MINERALS) TABS Take 1 tablet by mouth daily. Multivitamin for Men 50+  . nitroGLYCERIN (NITROSTAT) 0.4 MG SL tablet Place 1 tablet (0.4 mg total) under the tongue every 5 (five) minutes as needed for chest pain.  . pantoprazole (PROTONIX) 40 MG tablet TAKE ONE TABLET TWICE DAILY  . sacubitril-valsartan (ENTRESTO) 24-26 MG Take 1 tablet by mouth 2 (two) times daily.     Allergies:   Patient has no known allergies.   Social History   Socioeconomic History  . Marital status: Divorced    Spouse name: Not on file  . Number of children: Not on file  . Years of education: Not on file  . Highest education level: Not on file  Occupational History  .  Occupation: heavy Company secretary    Employer: [employer]  Tobacco Use  . Smoking status: Former Smoker    Packs/day: 3.00    Years: 40.00    Pack years: 120.00    Types: Cigarettes    Quit date: 04/05/1995    Years since quitting: 25.2  . Smokeless tobacco: Former Systems developer  . Tobacco comment: started smoking at age 52  Vaping Use  . Vaping Use: Never used  Substance and Sexual Activity  . Alcohol use: No    Comment: Occassional Use  . Drug use: No  . Sexual activity: Not on file  Other Topics Concern  . Not on file  Social History Narrative   Heavy Company secretary.; Divorced; quit smoking 25 years; beer rare; by self. Still riding motor cycles.    Social Determinants of Health   Financial Resource Strain: Medium Risk  . Difficulty of Paying Living Expenses: Somewhat hard  Food  Insecurity: Not on file  Transportation Needs: Not on file  Physical Activity: Not on file  Stress: Not on file  Social Connections: Not on file     Family History: The patient's family history includes Breast cancer in his sister; Coronary artery disease in his brother and father; Lung cancer in his brother; Prostate cancer in his brother; Stroke in his sister.  ROS:   Please see the history of present illness.    All other systems reviewed and are negative.  EKGs/Labs/Other Studies Reviewed:    The following studies were reviewed today:  July 10, 2020 in clinic device interrogation and reprogramming Battery longevity approximately 8 years Lead parameters are stable Reprogrammed the atrial output, LV lead output Biventricular pacing 97% No therapies delivered preexciting the left ventricle by 35 ms 17 beat run of nonsustained VT that was monitored at 179 bpm  EKG:  The ekg ordered today demonstrates a sense, V paced rhythm.  QRS duration 88 ms.  PR interval 240 ms.  Lead V1 shows an initial small R wave  Preemployment EKG reviewed and shows a left bundle branch block with a QRS  duration of 168 ms.  Peer left bundle pattern in lead V1.    Recent Labs: 09/07/2019: BNP 144.7 07/05/2020: ALT 36; BUN 21; Creatinine, Ser 1.40; Hemoglobin 14.8; Platelets 201; Potassium 4.4; Sodium 139; TSH 3.420  Recent Lipid Panel    Component Value Date/Time   CHOL 103 06/26/2020 0816   CHOL 103 06/07/2014 0824   TRIG 159.0 (H) 06/26/2020 0816   HDL 34.50 (L) 06/26/2020 0816   HDL 38 (L) 06/07/2014 0824   CHOLHDL 3 06/26/2020 0816   VLDL 31.8 06/26/2020 0816   LDLCALC 37 06/26/2020 0816   LDLCALC 59 03/30/2018 0930    Physical Exam:    VS:  BP 110/68   Pulse 69   Ht 5' 10"  (1.778 m)   Wt 233 lb (105.7 kg)   BMI 33.43 kg/m     Wt Readings from Last 3 Encounters:  07/10/20 233 lb (105.7 kg)  07/02/20 236 lb 3.2 oz (107.1 kg)  04/26/20 237 lb 9.6 oz (107.8 kg)     GEN: Well nourished, well developed in no acute distress HEENT: Normal NECK: No JVD; No carotid bruits LYMPHATICS: No lymphadenopathy CARDIAC: RRR, no murmurs, rubs, gallops.  Pacemaker pocket well-healed RESPIRATORY:  Clear to auscultation without rales, wheezing or rhonchi  ABDOMEN: Soft, non-tender, non-distended MUSCULOSKELETAL:  No edema; No deformity  SKIN: Warm and dry NEUROLOGIC:  Alert and oriented x 3 PSYCHIATRIC:  Normal affect   ASSESSMENT:    1. Chronic systolic heart failure ([place])   2. ICD (implantable cardioverter-defibrillator) in place    PLAN:    In order of problems listed above:  1. Chronic systolic heart failure NYHA class II.  Warm and dry.  Continue carvedilol, [name], Lasix, Entresto. Patient has good burden of biventricular pacing.  QRS duration is less than 90 ms.  2.  CRT-D in situ Device functioning appropriately.  Continue remote monitoring.  Follow-up 9 months or sooner as needed.    Medication Adjustments/Labs and Tests Ordered: Current medicines are reviewed at length with the patient today.  Concerns regarding medicines are outlined above.  Orders Placed  This Encounter  Procedures  . EKG 12-Lead  . ECHOCARDIOGRAM COMPLETE   No orders of the defined types were placed in this encounter.    Signed, Lars Mage, MD, [facility], [facility] 07/10/2020 9:01 PM    Electrophysiology [place]  [facility]

## 2020-07-11 ENCOUNTER — Other Ambulatory Visit: Payer: Self-pay | Admitting: Family Medicine

## 2020-07-11 DIAGNOSIS — M1710 Unilateral primary osteoarthritis, unspecified knee: Secondary | ICD-10-CM

## 2020-07-11 NOTE — Telephone Encounter (Signed)
PT called to return the call about results.

## 2020-07-16 ENCOUNTER — Other Ambulatory Visit: Payer: Self-pay

## 2020-07-16 ENCOUNTER — Ambulatory Visit
Admission: RE | Admit: 2020-07-16 | Discharge: 2020-07-16 | Disposition: A | Discharge status: Home / Self Care | Payer: PPO | Source: Ambulatory Visit | Attending: Internal Medicine | Admitting: Internal Medicine

## 2020-07-16 DIAGNOSIS — K769 Liver disease, unspecified: Secondary | ICD-10-CM

## 2020-07-19 ENCOUNTER — Other Ambulatory Visit: Payer: Self-pay | Admitting: Cardiovascular Disease

## 2020-07-19 ENCOUNTER — Telehealth: Payer: Self-pay | Admitting: Internal Medicine

## 2020-07-19 NOTE — Telephone Encounter (Signed)
MRI cancelled due to pacemaker only do these in [place] called and left message for them to schedule, [facility]in [facility]anna is one that does it. Will call back to schedule if not done by Monday. [name] daughter is aware and Dr. B is ok with plan.

## 2020-07-22 ENCOUNTER — Other Ambulatory Visit: Payer: Self-pay | Admitting: *Deleted

## 2020-07-22 MED ORDER — ATORVASTATIN CALCIUM 40 MG PO TABS
1.0000 | ORAL_TABLET | Freq: Every day | ORAL | 0 refills | Status: DC
Start: 2020-07-22 — End: 2020-09-24

## 2020-07-23 ENCOUNTER — Inpatient Hospital Stay: Payer: PPO | Admitting: Internal Medicine

## 2020-07-23 ENCOUNTER — Inpatient Hospital Stay: Payer: PPO

## 2020-07-25 ENCOUNTER — Encounter: Payer: Self-pay | Admitting: Cardiovascular Disease

## 2020-07-25 ENCOUNTER — Other Ambulatory Visit: Payer: Self-pay

## 2020-07-25 ENCOUNTER — Ambulatory Visit: Payer: PPO | Admitting: Cardiovascular Disease

## 2020-07-25 VITALS — BP 120/90 | HR 66 | Ht 70.0 in | Wt 235.4 lb

## 2020-07-25 DIAGNOSIS — E785 Hyperlipidemia, unspecified: Secondary | ICD-10-CM | POA: Diagnosis not present

## 2020-07-25 DIAGNOSIS — I2510 Atherosclerotic heart disease of native coronary artery without angina pectoris: Secondary | ICD-10-CM

## 2020-07-25 DIAGNOSIS — I10 Essential (primary) hypertension: Secondary | ICD-10-CM | POA: Diagnosis not present

## 2020-07-25 DIAGNOSIS — I5023 Acute on chronic systolic (congestive) heart failure: Secondary | ICD-10-CM

## 2020-07-25 DIAGNOSIS — I5022 Chronic systolic (congestive) heart failure: Secondary | ICD-10-CM

## 2020-07-25 DIAGNOSIS — Z95810 Presence of automatic (implantable) cardiac defibrillator: Secondary | ICD-10-CM | POA: Diagnosis not present

## 2020-07-25 MED ORDER — ENTRESTO 49-51 MG PO TABS: 1.0000 | ORAL_TABLET | Freq: Two times a day (BID) | ORAL | 11 refills | Status: DC

## 2020-07-25 MED ORDER — ENTRESTO 49-51 MG PO TABS
1.0000 | ORAL_TABLET | Freq: Two times a day (BID) | ORAL | 3 refills | Status: DC
Start: 2020-07-25 — End: 2020-10-24

## 2020-07-25 NOTE — Patient Instructions (Signed)
Medication Instructions:  STOP Hydralazine  INCREASED Entresto to 49-51 mg tab  *If you need a refill on your cardiac medications before your next appointment, please call your pharmacy*   Lab Work: BMET (in one week) [place] into medical mall at the check in desk, they will direct you to lab registration, hours for labs are Monday-Friday 07:00am-5:30pm (no appointment necessary)   Testing/Procedures: None  Follow-Up: At [facility], you and your health needs are our priority.  As part of our continuing mission to provide you with exceptional heart care, we have created designated Provider Care Teams.  These Care Teams include your primary Cardiologist (physician) and Advanced Practice Providers (APPs -  Physician Assistants and Nurse Practitioners) who all work together to provide you with the care you need, when you need it.   Your next appointment:   3 month(s)  The format for your next appointment:   In Person  Provider:   You may see [name], MD or one of the following Advanced Practice Providers on your designated Care Team:   Murray Hodgkins, NP Christell Faith, PA-C Marrianne Mood, PA-C Cadence [place], [place]

## 2020-07-25 NOTE — Progress Notes (Signed)
Cardiology Office Note   Date:  07/25/2020   ID:  Devin Hanna, DOB Jul 04, 1950, MRN [id number]  PCP:  [name], MD  Cardiologist:   [name], MD   Chief Complaint  Patient presents with   Other    6 month f/u no complaints today. Meds reviewed verbally with pt.       History of Present Illness: Devin Hanna is a 70 y.o. male who presents for a followup visit regarding coronary artery disease and chronic systolic heart failure.  He has known CAD with past RCA stents and prior MI, chronic systolic heart failure, peripheral neuropathy, left bundle branch block, prior GI bleed GERD, DM, HTN and HLD.  He was hospitalized in June of 2021 with acute on chronic systolic heart failure with acute kidney injury.  Echocardiogram showed an EF of 25 to 30%.  He underwent a right and left cardiac catheterization in July which showed patent RCA stent with mild in-stent restenosis and moderate disease affecting the left system with moderate calcifications.  Right heart catheterization showed mildly elevated filling pressures and mild pulmonary hypertension with normal cardiac output.  Outpatient renal artery duplex showed no evidence of renal artery stenosis.  He was started on [name]. Hydralazine was added for blood pressure control.    Repeat echocardiogram in January 2022 showed an EF of 30 to 35%.  He underwent biventricular ICD in February by Dr. Quentin Ore.  His renal function gradually improved with most recent GFR 53.  He lost 9 pounds since his last visit with me in December.  He has been doing well with no chest pain or worsening dyspnea.   Past Medical History:  Diagnosis Date   Acute respiratory failure with hypoxia ([place]) 06/2016   CAD (coronary artery disease)    a. Remote PCI of the RCA;  b. 03/2013 Inf STEMI/PCI: LAD 60-70d, RCA 99p ISR(rota/CBA/DES);  c. 09/2014 MV: inf and infsept infarct, EF 35-40%;  d. 09/2014 Cath: patent RCA stent, stable LAD dzs, EF 40%-->Med  rx.   CHF (congestive heart failure) ([place])    Diabetes mellitus    sees Dr. Hardin Negus in [place]   GERD (gastroesophageal reflux disease)    HFrEF (heart failure with reduced ejection fraction) ([place])    Hyperlipidemia    Hypertension    Ischemic cardiomyopathy    a. 09/2014 EF 40% by V gram;  b. 06/2016 Echo: EF 40, diff HK, Gr1 DD, inf HK, no mural thrombus. Mild MR, mildly dil LA, mildly dil RV.    LBBB (left bundle branch block)    Old   Liver mass    Morbid obesity ([place])    Peripheral neuropathy    Upper GI bleed    Vertigo     Past Surgical History:  Procedure Laterality Date   BIV ICD INSERTION CRT-D N/A 04/08/2020   Procedure: BIV ICD INSERTION CRT-D;  Surgeon: Vickie Epley, MD;  Location: [place];  Service: Cardiovascular;  Laterality: N/A;   CARDIAC CATHETERIZATION N/A 10/03/2014   Procedure: Left Heart Cath and Coronary Angiography;  Surgeon: [name], MD;  Location: [place];  Service: Cardiovascular;  Laterality: N/A;   CIRCUMCISION N/A 07/22/2015   Procedure: CIRCUMCISION ADULT;  Surgeon: Hollice Espy, MD;  Location: [facility];  Service: Urology;  Laterality: N/A;   COLONOSCOPY WITH PROPOFOL N/A 01/12/2019   Procedure: COLONOSCOPY WITH PROPOFOL;  Surgeon: Lucilla Lame, MD;  Location: [facility];  Service: Endoscopy;  Laterality:  N/A;   CORONARY STENT PLACEMENT     ESOPHAGOGASTRODUODENOSCOPY (EGD) WITH PROPOFOL N/A 09/12/2014   Procedure: ESOPHAGOGASTRODUODENOSCOPY (EGD) WITH PROPOFOL;  Surgeon: Ronald Lobo, MD;  Location: [place];  Service: Endoscopy;  Laterality: N/A;   EXTERNAL FIXATION LEG Left 04/03/2012   Procedure: EXTERNAL FIXATION LEG;  Surgeon: Wylene Simmer, MD;  Location: [place];  Service: Orthopedics;  Laterality: Left;   EXTERNAL FIXATION REMOVAL Left 04/14/2012   Procedure: REMOVAL EXTERNAL FIXATION LEG;  Surgeon: Wylene Simmer, MD;  Location: [place];  Service: Orthopedics;  Laterality: Left;   IRRIGATION AND DEBRIDEMENT  KNEE Left 04/03/2012   Procedure: IRRIGATION AND DEBRIDEMENT KNEE;  Surgeon: Wylene Simmer, MD;  Location: [place];  Service: Orthopedics;  Laterality: Left;   LEFT HEART CATHETERIZATION WITH CORONARY ANGIOGRAM N/A 04/05/2013   Procedure: LEFT HEART CATHETERIZATION WITH CORONARY ANGIOGRAM;  Surgeon: [name], MD;  Location: [place];  Service: Cardiovascular;  Laterality: N/A;   MASS EXCISION N/A 07/22/2015   Procedure: EXCISION PENILE MASS;  Surgeon: Hollice Espy, MD;  Location: [facility];  Service: Urology;  Laterality: N/A;   ORIF TIBIA PLATEAU Left 04/14/2012   Procedure: OPEN REDUCTION INTERNAL FIXATION (ORIF) TIBIAL PLATEAU;  Surgeon: Wylene Simmer, MD;  Location: [place];  Service: Orthopedics;  Laterality: Left;   PERCUTANEOUS CORONARY ROTOBLATOR INTERVENTION (PCI-R) N/A 04/06/2013   Procedure: PERCUTANEOUS CORONARY ROTOBLATOR INTERVENTION (PCI-R);  Surgeon: [name], MD;  Location: [facility];  Service: Cardiovascular;  Laterality: N/A;   RIGHT/LEFT HEART CATH AND CORONARY ANGIOGRAPHY N/A 08/21/2019   Procedure: RIGHT/LEFT HEART CATH AND CORONARY ANGIOGRAPHY;  Surgeon: [name], MD;  Location: [place] [facility];  Service: Cardiovascular;  Laterality: N/A;     Current Outpatient Medications  Medication Sig Dispense Refill   atorvastatin (LIPITOR) 40 MG tablet Take 1 tablet (40 mg total) by mouth daily. 90 tablet 0   carvedilol (COREG) 6.25 MG tablet TAKE 1 TABLET BY MOUTH TWICE DAILY WITH A MEAL 60 tablet 0   CINNAMON PO Take 1,000 mg by mouth in the morning and at bedtime.     clopidogrel (PLAVIX) 75 MG tablet TAKE ONE TABLET EVERY MORNING WITH BREAKFAST 90 tablet 3   dapagliflozin propanediol ([name]) 10 MG TABS tablet Take 1 tablet (10 mg total) by mouth daily before breakfast. 30 tablet 3   Dulaglutide ([name]) 3 DZ/3.2DJ SOPN Inject 3 mg as directed once a week. 6 mL 0   ferrous sulfate 325 (65 FE) MG tablet Take 162.5 mg by mouth daily with breakfast.      furosemide (LASIX) 40 MG tablet Take 1 tablet (40 mg total) by mouth daily. 90 tablet 3   gabapentin (NEURONTIN) 300 MG capsule TAKE 1 CAPSULE BY MOUTH 3 TIMES DAILY 90 capsule 3   Multiple Vitamin (MULTIVITAMIN WITH MINERALS) TABS Take 1 tablet by mouth daily. Multivitamin for Men 50+     nitroGLYCERIN (NITROSTAT) 0.4 MG SL tablet Place 1 tablet (0.4 mg total) under the tongue every 5 (five) minutes as needed for chest pain. 25 tablet 0   pantoprazole (PROTONIX) 40 MG tablet TAKE ONE TABLET TWICE DAILY 180 tablet 1   sacubitril-valsartan (ENTRESTO) 49-51 MG Take 1 tablet by mouth 2 (two) times daily. 180 tablet 3   No current facility-administered medications for this visit.    Allergies:   Patient has no known allergies.    Social History:  The patient  reports that he quit smoking about 25 years ago. His smoking use included cigarettes.  He has a 120.00 pack-year smoking history. He has quit using smokeless tobacco. He reports that he does not drink alcohol and does not use drugs.   Family History:  The patient's family history includes Breast cancer in his sister; Coronary artery disease in his brother and father; Lung cancer in his brother; Prostate cancer in his brother; Stroke in his sister.    ROS:  Please see the history of present illness.   Otherwise, review of systems are positive for none.   All other systems are reviewed and negative.    PHYSICAL EXAM: VS:  BP 120/90 (BP Location: Left Arm, Patient Position: Sitting, Cuff Size: Normal)   Pulse 66   Ht 5' 10"  (1.778 m)   Wt 235 lb 6 oz (106.8 kg)   SpO2 97%   BMI 33.77 kg/m  , BMI Body mass index is 33.77 kg/m. GEN: Well nourished, well developed, in no acute distress  HEENT: normal  Neck:no JVD, no carotid bruits, or masses Cardiac: RRR; no  rubs, or gallops. . 2/6 systolic ejection murmur in the aortic area which is early peaking .  Mild bilateral leg edema Respiratory:  clear to auscultation bilaterally, normal work  of breathing GI: soft, nontender, nondistended, + BS MS: no deformity or atrophy  Skin: warm and dry, no rash Neuro:  Strength and sensation are intact Psych: euthymic mood, full affect   EKG:  EKG is ordered today. The ekg ordered today demonstrates sinus rhythm with biventricular pacemaker.  Recent Labs: 09/07/2019: BNP 144.7 07/05/2020: ALT 36; BUN 21; Creatinine, Ser 1.40; Hemoglobin 14.8; Platelets 201; Potassium 4.4; Sodium 139; TSH 3.420    Lipid Panel    Component Value Date/Time   CHOL 103 06/26/2020 0816   CHOL 103 06/07/2014 0824   TRIG 159.0 (H) 06/26/2020 0816   HDL 34.50 (L) 06/26/2020 0816   HDL 38 (L) 06/07/2014 0824   CHOLHDL 3 06/26/2020 0816   VLDL 31.8 06/26/2020 0816   LDLCALC 37 06/26/2020 0816   LDLCALC 59 03/30/2018 0930      Wt Readings from Last 3 Encounters:  07/25/20 235 lb 6 oz (106.8 kg)  07/10/20 233 lb (105.7 kg)  07/02/20 236 lb 3.2 oz (107.1 kg)        ASSESSMENT AND PLAN:  1.  Coronary artery disease involving native coronary arteries without angina:: He is doing reasonably well with no anginal symptoms. Continue medical therapy.  I asked him to continue Plavix without aspirin given previous history of GI bleeding ulcers.  2. Chronic systolic heart failure: He is euvolemic on current dose of furosemide.   He is on optimal medical therapy including carvedilol, Entresto and [place].  Now that his renal function has improved, we can increase the dose of Entresto to 49/51 mg twice daily and discontinue hydralazine.  If renal function remains stable, we might even consider resuming spironolactone.  I requested basic metabolic profile to be done in 1 week.  3. Essential hypertension: Blood pressure is well controlled on current medications but I elected to make changes on his blood pressure medications in order to allow up titration of heart failure medication.  4. Hyperlipidemia: I asked him to continue treatment with atorvastatin. LDL has  been consistently below 70.  Most recent lipid profile showed an LDL of 44.    5.  Type 2 diabetes.  Diabetes control has improved.  I reviewed his labs which showed improvement of hemoglobin A1c from 7.1 to 6.3.  6.  Status post biventricular  ICD placement: His pacemaker seems to be functioning normally based on EKG which was reviewed by me today.  Continue follow-up with Dr. Quentin Ore.   Disposition:   FU with me in 3 months  Signed,  [name], MD  07/25/2020 1:18 PM    [facility]

## 2020-07-29 ENCOUNTER — Other Ambulatory Visit (INDEPENDENT_AMBULATORY_CARE_PROVIDER_SITE_OTHER): Payer: PPO

## 2020-07-29 ENCOUNTER — Other Ambulatory Visit: Payer: Self-pay

## 2020-07-29 DIAGNOSIS — R748 Abnormal levels of other serum enzymes: Secondary | ICD-10-CM | POA: Diagnosis not present

## 2020-07-31 LAB — ALKALINE PHOSPHATASE, ISOENZYMES
Alkaline Phosphatase: 153 IU/L — ABNORMAL HIGH (ref 44–121)
BONE FRACTION: 47 % (ref 12–68)
INTESTINAL FRAC.: 3 % (ref 0–18)
LIVER FRACTION: 50 % (ref 13–88)

## 2020-07-31 LAB — SPECIMEN STATUS REPORT

## 2020-08-01 ENCOUNTER — Other Ambulatory Visit: Payer: Self-pay

## 2020-08-01 ENCOUNTER — Other Ambulatory Visit
Admission: RE | Admit: 2020-08-01 | Discharge: 2020-08-01 | Disposition: A | Discharge status: Home / Self Care | Payer: PPO | Source: Ambulatory Visit | Attending: Cardiovascular Disease | Admitting: Cardiovascular Disease

## 2020-08-01 DIAGNOSIS — I5022 Chronic systolic (congestive) heart failure: Secondary | ICD-10-CM

## 2020-08-01 LAB — BASIC METABOLIC PANEL
Anion gap: 9 (ref 5–15)
BUN: 15 mg/dL (ref 8–23)
CO2: 25 mmol/L (ref 22–32)
Calcium: 8.9 mg/dL (ref 8.9–10.3)
Chloride: 103 mmol/L (ref 98–111)
Creatinine, Ser: 1.22 mg/dL (ref 0.61–1.24)
GFR, Estimated: >60 mL/min (ref >60)
Glucose, Bld: 131 mg/dL — ABNORMAL HIGH (ref 70–99)
Potassium: 4.3 mmol/L (ref 3.5–5.1)
Sodium: 137 mmol/L (ref 135–145)

## 2020-08-01 NOTE — Progress Notes (Signed)
Remote ICD transmission.   

## 2020-08-01 NOTE — Progress Notes (Signed)
bm 

## 2020-08-04 ENCOUNTER — Other Ambulatory Visit: Payer: Self-pay | Admitting: Family Medicine

## 2020-08-04 DIAGNOSIS — R748 Abnormal levels of other serum enzymes: Secondary | ICD-10-CM

## 2020-08-05 NOTE — Progress Notes (Signed)
Left message to return call 

## 2020-08-08 ENCOUNTER — Ambulatory Visit (INDEPENDENT_AMBULATORY_CARE_PROVIDER_SITE_OTHER): Payer: PPO | Admitting: Pharmacist

## 2020-08-08 DIAGNOSIS — I10 Essential (primary) hypertension: Secondary | ICD-10-CM | POA: Diagnosis not present

## 2020-08-08 DIAGNOSIS — I5022 Chronic systolic (congestive) heart failure: Secondary | ICD-10-CM | POA: Diagnosis not present

## 2020-08-08 DIAGNOSIS — E785 Hyperlipidemia, unspecified: Secondary | ICD-10-CM | POA: Diagnosis not present

## 2020-08-08 DIAGNOSIS — E1142 Type 2 diabetes mellitus with diabetic polyneuropathy: Secondary | ICD-10-CM

## 2020-08-08 DIAGNOSIS — I25118 Atherosclerotic heart disease of native coronary artery with other forms of angina pectoris: Secondary | ICD-10-CM

## 2020-08-08 NOTE — Patient Instructions (Addendum)
Devin Hanna,   Keep up the great work!  1) Schedule your yearly diabetic eye exam - have them fax the results to our office. 2) Talk to your local pharmacy about the Shingrix vaccine. Let me know if they would like a prescription from Dr. [name] for this.   Keep checking your blood pressures at home. This higher dose of Entresto will likely lower your blood pressure. Call Dr. Fletcher Anon if you have any lightheadness, dizziness, or blood pressure readings <100.  Take care!  [name], [name] [phone]  Visit Information  PATIENT GOALS:  Goals Addressed               This Visit's Progress     Patient Stated     Medication Monitoring (pt-stated)        Patient Goals/Self-Care Activities Over the next 90 days, patient will:  - take medications as prescribed check glucose 3-5 times weekly, document, and provide at future appointments check blood pressure 3-5 times weekly, document, and provide at future appointments weigh daily, and contact provider if weight gain of greater than 3 lbs in a day or greater than 5 lbs in a week collaborate with provider on medication access solutions          The patient verbalized understanding of instructions, educational materials, and care plan provided today and agreed to receive a mailed copy of patient instructions, educational materials, and care plan.    Plan: Telephone follow up appointment with care management team member scheduled for:  ~ 4 months  [name], [name], [place], [place] Clinical Pharmacist [employer] [facility] [employer] [phone]

## 2020-08-08 NOTE — Chronic Care Management (AMB) (Signed)
Chronic Care Management Pharmacy Note  08/08/2020 Name:  Devin Hanna MRN:  [id number] DOB:  February 21, 1950  Subjective: Devin Hanna is an 70 y.o. year old male who is a primary patient of [name], Angela Adam, MD.  The CCM team was consulted for assistance with disease management and care coordination needs.    Engaged with patient by telephone for follow up visit in response to provider referral for pharmacy case management and/or care coordination services.   Consent to Services:  The patient was given information about Chronic Care Management services, agreed to services, and gave verbal consent prior to initiation of services.  Please see initial visit note for detailed documentation.   Patient Care Team: [name], MD as PCP - General (Family Medicine) [name], MD as PCP - Cardiology (Cardiology) Vickie Epley, MD as PCP - Electrophysiology (Cardiology) Clent Jacks, RN as Oncology Nurse Navigator [name], RPH-CPP (Pharmacist)  Recent office visits: 5/24 - PCP - some increased swelling, increase furosemide to 40 mg QAM, 20 mg QPM x 3 days then 40 mg QAM (A1c 6.3%, LDL 37)   Recent consult visits: 6/1 - EP Quentin Ore - f/u ICD impant (03/2020) 6/16- cardiology Arida - increase Entresto to 49/51 mg BID, stop hydralazine.   Hospital visits: None in previous 6 months  Objective:  Lab Results  Component Value Date   CREATININE 1.22 08/01/2020   CREATININE 1.40 (H) 07/05/2020   CREATININE 1.37 (H) 04/04/2020    Lab Results  Component Value Date   HGBA1C 6.3 06/26/2020   Last diabetic Eye exam: No results found for: HMDIABEYEEXA  Last diabetic Foot exam: No results found for: HMDIABFOOTEX      Component Value Date/Time   CHOL 103 06/26/2020 0816   CHOL 103 06/07/2014 0824   TRIG 159.0 (H) 06/26/2020 0816   HDL 34.50 (L) 06/26/2020 0816   HDL 38 (L) 06/07/2014 0824   CHOLHDL 3 06/26/2020 0816   VLDL 31.8 06/26/2020 0816    LDLCALC 37 06/26/2020 0816   LDLCALC 59 03/30/2018 0930    Hepatic Function Latest Ref Rng & Units 07/29/2020 07/05/2020 01/22/2020  Total Protein 6.0 - 8.5 g/dL - 6.6 7.1  Albumin 3.7 - 4.7 g/dL - 3.8 3.5  AST 0 - 40 IU/L - 40 52(H)  ALT 0 - 44 IU/L - 36 45(H)  Alk Phosphatase 44 - 121 IU/L 153(H) 165(H) 134(H)  Total Bilirubin 0.0 - 1.2 mg/dL - 0.6 0.8  Bilirubin, Direct - - - -    Lab Results  Component Value Date/Time   TSH 3.420 07/05/2020 03:03 PM   TSH 0.889 09/09/2014 03:05 AM   TSH 3.056 04/04/2013 10:20 AM    CBC Latest Ref Rng & Units 07/05/2020 04/04/2020 01/22/2020  WBC 3.4 - 10.8 x10E3/uL 6.9 7.9 6.6  Hemoglobin 13.0 - 17.7 g/dL 14.8 14.5 13.7  Hematocrit 37.5 - 51.0 % 43.0 44.3 42.1  Platelets 150 - 450 x10E3/uL 201 187 184    Clinical ASCVD: Yes  The ASCVD Risk score (Goff DC Jr., et al., 2013) failed to calculate for the following reasons:   The valid total cholesterol range is 130 to 320 mg/dL      Social History   Tobacco Use  Smoking Status Former   Packs/day: 3.00   Years: 40.00   Pack years: 120.00   Types: Cigarettes   Quit date: 04/05/1995   Years since quitting: 25.3  Smokeless Tobacco Former  Tobacco Comments  started smoking at age 61   BP Readings from Last 3 Encounters:  07/25/20 120/90  07/10/20 110/68  07/02/20 130/80   Pulse Readings from Last 3 Encounters:  07/25/20 66  07/10/20 69  07/02/20 70   Wt Readings from Last 3 Encounters:  07/25/20 235 lb 6 oz (106.8 kg)  07/10/20 233 lb (105.7 kg)  07/02/20 236 lb 3.2 oz (107.1 kg)    Assessment: Review of patient past medical history, allergies, medications, health status, including review of consultants reports, laboratory and other test data, was performed as part of comprehensive evaluation and provision of chronic care management services.   SDOH:  (Social Determinants of Health) assessments and interventions performed:  SDOH Interventions    Flowsheet Row Most Recent  Value  SDOH Interventions   Financial Strain Interventions Other (Comment)  [manufacturer assistance]       CCM Care Plan  No Known Allergies  Medications Reviewed Today     Reviewed by [name], RPH-CPP (Pharmacist) on 08/08/20 at [place] List Status: <None>   Medication Order Taking? Sig Documenting Provider Last Dose Status Informant  atorvastatin (LIPITOR) 40 MG tablet [id number] Yes Take 1 tablet (40 mg total) by mouth daily. Theora Gianotti, NP Taking Active   carvedilol (COREG) 6.25 MG tablet [id number] Yes TAKE 1 TABLET BY MOUTH TWICE DAILY WITH A MEAL [name], MD Taking Active   CINNAMON PO [id number] Yes Take 1,000 mg by mouth in the morning and at bedtime. [provider] Taking Active Child  clopidogrel (PLAVIX) 75 MG tablet [id number] Yes TAKE ONE TABLET EVERY MORNING WITH BREAKFAST [name], NP Taking Active Child  dapagliflozin propanediol ([name]) 10 MG TABS tablet [id number] Yes Take 1 tablet (10 mg total) by mouth daily before breakfast. [name], MD Taking Active Child  Dulaglutide ([name]) 3 XV/8.5BM SOPN [id number] Yes Inject 3 mg as directed once a week. [name], MD Taking Active Child  ferrous sulfate 325 (65 FE) MG tablet [id number] Yes Take 162.5 mg by mouth daily with breakfast. [provider] Taking Active   furosemide (LASIX) 40 MG tablet [id number] Yes Take 1 tablet (40 mg total) by mouth daily. [name], NP Taking Active Child  gabapentin (NEURONTIN) 300 MG capsule [id number] Yes TAKE 1 CAPSULE BY MOUTH 3 TIMES DAILY [name], MD Taking Active   Multiple Vitamin (MULTIVITAMIN WITH MINERALS) TABS [id number] Yes Take 1 tablet by mouth daily. Multivitamin for Men 50+ [provider] Taking Active Child  nitroGLYCERIN (NITROSTAT) 0.4 MG SL tablet [id number]  Place 1 tablet (0.4 mg total) under the tongue every 5 (five) minutes as needed for chest pain. [name], MD  Active   pantoprazole (PROTONIX) 40 MG tablet [id number] Yes TAKE ONE TABLET TWICE DAILY [name], NP Taking Active   sacubitril-valsartan (ENTRESTO) 49-51 MG [id number] Yes Take 1 tablet by mouth 2 (two) times daily. [name], MD Taking Active             Patient Active Problem List   Diagnosis Date Noted   Cough 01/11/2020   Left knee pain 01/11/2020   Abnormal cardiovascular stress test    Acute on chronic systolic heart failure ([place]) 07/30/2019   AK (actinic keratosis) 04/03/2019   Colon cancer screening    Rectal polyp    Polyp of transverse colon    Liver lesion 12/06/2018   Chronic kidney disease (CKD), stage III (moderate) ([place]) 12/06/2018  Abrasion 12/06/2018   History of anemia 03/30/2018   Hyperpigmented skin lesion 12/20/2017   Bilateral foot pain 12/20/2017   DDD (degenerative disc disease), lumbar 08/18/2017   SCC (squamous cell carcinoma), trunk 05/17/2017   Venous insufficiency 02/12/2017   Decreased pedal pulses 02/12/2017   Skin lesion 02/12/2017   Neuropathy 01/09/2016   Elevated LFTs 06/28/2015   History of penile cancer 06/28/2015   Peptic ulcer disease 09/12/2014   Erectile dysfunction 09/32/6712   Chronic systolic heart failure ([place]) 05/15/2013   Hyperlipidemia 05/15/2013   DM type 2 (diabetes mellitus, type 2) ([place]) 04/07/2013   Other and unspecified hyperlipidemia 04/07/2013   Morbid obesity ([place]) 04/07/2013   HTN (hypertension) 04/07/2013   CAD (coronary artery disease) 04/04/2013    Immunization History  Administered Date(s) Administered   Fluad Quad(high Dose 65+) 10/26/2018, 01/11/2020   Influenza, High Dose Seasonal PF 01/09/2016, 10/27/2016, 12/20/2017   Pneumococcal Conjugate-13 01/09/2016   Pneumococcal Polysaccharide-23 09/09/2014   Tdap 01/21/2018    Conditions to be addressed/monitored: CHF, CAD, HTN, HLD, and DMII  Care Plan : Medication Management  Updates made by [name], RPH-CPP  since 08/08/2020 12:00 AM     Problem: Diabetes, CHF, HTN      Long-Range Goal: Disease Progression Prevention   Start Date: 08/08/2020  This Visit's Progress: On track  Recent Progress: On track  Priority: High  Note:   Current Barriers:  Unable to independently afford treatment regimen Unable to achieve control of diabetes  Complex patient with multiple comorbidities including HFrEF, CAD, HLD  Pharmacist Clinical Goal(s):  Over the next 90 days, patient will verbalize ability to afford treatment regimen. Over the next 90 days, patient will achieve adherence to monitoring guidelines and medication adherence to achieve therapeutic efficacy. Over the next 90 days, patient will achieve control of diabetes as evidenced by improvement in A1c  Interventions: 1:1 collaboration with [name], MD regarding development and update of comprehensive plan of care as evidenced by provider attestation and co-signature Inter-disciplinary care team collaboration (see longitudinal plan of care) Comprehensive medication review performed; medication list updated in electronic medical record  Health Maintenance: Due for eye exam, foot exam. Discussed need for diabetic eye exam. Encouraged to schedule. He notes he will do so.  Dicussed Shingrix vaccine and discussed shingles. Encouraged to pursue vaccination at his local pharmacy Declines COVID vaccine. Previously encouraged to let me know if he changes his mind in the future.   Diabetes: Controlled, relaxed goal of A1c <7.5% likely appropriate given age, comorbidities; current treatment: [name] 3 mg weekly; [name] 10 mg daily Metformin d/c d/t renal function  Approved for patient assistance for [name], [name]. Current glucose readings:   Fasting After Meal  14-Jun 133 170  16-Jun 116 131  18-Jun 152 141  20-Jun 147 159  22-Jun 152 159  24-Jun 156 148  26-Jun 150 152  28-Jun 160 168  AVG: 145 154  Current meal patterns:  breakfast: scrambled egg, sometimes piece of sausage; lunch: 1-2 vegetables; supper: vegetables, sometimes proteins; drinks: water, koolaid w/ sweet and low occasionally; Discussed to minimize late night snacking, given trend of increasing fasting readings over the month. Recommended to continue current regimen at this time. Discussed health maintenance actions as above.  HFrEF, (most recent EF 30-35%); s/p CRT-D placement 2/28 Appropriately managed; current treatment: furosemide 40 mg daily, carvedilol 6.25 mg BID, Entresto 49/51 mg BID - started yesterday, but stopped hydralazine after Dr. Tyrell Antonio appointment on 6/16; follows w/ Dr. Fletcher Anon,  Dr. Quentin Ore Home BP readings:   AM SBP AM DBP AM HR PM SBP PM DBP PM HR  14-Jun 133 68 69 121 54 73  16-Jun 143 74 69     18-Jun 114 65 71     20-Jun 138 77 70 123 59 75  22-Jun 125 65 70     24-Jun 141 66 70     26-Jun 131 71 70 128 58 71  28-Jun 134 74 71     AVG: 130 70 70 124 57 73  Entresto through [name] assistance through 2022. Recommend to continue current regimen at this time. Continue home daily weights and BP monitoring.  Reviewed impact of increased Entresto dose on BP. Encouraged to continue to monitor BP, HR and contact Dr. Fletcher Anon if development of lightheadedness, dizziness Reviewed to contact cardiology if weight gain >3 lbs/day or > 5 lbs/week  Hyperlipidemia, secondary ASCVD prevention (hx MI w/ RCA stenting) Controlled per last lipid panel; current treatment: atorvastatin 40 mg daily Antiplatelet regimen: clopidogrel 75 mg daily Recommended to continue current regimen at this time.   GERD/hx peptic ulcer disease: Appropriately managed and controlled per patient report; current treatment: pantoprazole 40 mg BID Continue current regimen at this time. PPI use appropriate given PUD.  Supplement: Cinnamon  Patient Goals/Self-Care Activities Over the next 90 days, patient will:  - take medications as prescribed check glucose 3-4  times weekly, document, and provide at future appointments check blood pressure twice daily, document, and provide at future appointments weigh daily, and contact provider if weight gain of greater than 3 lbs in a day or greater than 5 lbs in a week collaborate with provider on medication access solutions  Follow Up Plan: Telephone follow up appointment with care management team member scheduled for: ~ 4 months        Medication Assistance: Wilder Glade, [name] obtained through [name], [place] medication assistance program.  Enrollment ends 02/08/21  Patient's preferred pharmacy is:  [facility], [place] - [address] [place] [address] Phone: [phone] Fax: [phone]  [facility], [place] - [address] [place] [address] Phone: [phone] Fax: [phone]  Follow Up:  Patient agrees to Care Plan and Follow-up.  Plan: Telephone follow up appointment with care management team member scheduled for:  ~ 4 months  [name], [name], [place], [place] Clinical Pharmacist [employer] at [employer] [phone]

## 2020-08-13 ENCOUNTER — Other Ambulatory Visit: Payer: Self-pay

## 2020-08-13 ENCOUNTER — Other Ambulatory Visit (INDEPENDENT_AMBULATORY_CARE_PROVIDER_SITE_OTHER): Payer: PPO

## 2020-08-13 DIAGNOSIS — R748 Abnormal levels of other serum enzymes: Secondary | ICD-10-CM | POA: Diagnosis not present

## 2020-08-13 LAB — GAMMA GT: GGT: 86 U/L — ABNORMAL HIGH (ref 7–51)

## 2020-08-15 ENCOUNTER — Ambulatory Visit (INDEPENDENT_AMBULATORY_CARE_PROVIDER_SITE_OTHER): Payer: PPO

## 2020-08-15 ENCOUNTER — Other Ambulatory Visit: Payer: Self-pay

## 2020-08-15 DIAGNOSIS — I5022 Chronic systolic (congestive) heart failure: Secondary | ICD-10-CM | POA: Diagnosis not present

## 2020-08-15 DIAGNOSIS — Z95810 Presence of automatic (implantable) cardiac defibrillator: Secondary | ICD-10-CM

## 2020-08-15 LAB — ECHOCARDIOGRAM COMPLETE
AR max vel: 3.77 cm2
AV Area VTI: 3.93 cm2
AV Area mean vel: 3.45 cm2
AV Mean grad: 3 mmHg
AV Peak grad: 5 mmHg
Ao pk vel: 1.12 m/s
Area-P 1/2: 4.8 cm2

## 2020-08-15 MED ORDER — PERFLUTREN LIPID MICROSPHERE
1.0000 mL | INTRAVENOUS | Status: AC | PRN
  Administered 2020-08-15: 2 mL via INTRAVENOUS

## 2020-08-16 ENCOUNTER — Other Ambulatory Visit: Payer: Self-pay | Admitting: Cardiovascular Disease

## 2020-08-19 ENCOUNTER — Other Ambulatory Visit: Payer: Self-pay

## 2020-08-19 ENCOUNTER — Ambulatory Visit (HOSPITAL_COMMUNITY)
Admission: RE | Admit: 2020-08-19 | Discharge: 2020-08-19 | Disposition: A | Discharge status: Home / Self Care | Payer: PPO | Source: Ambulatory Visit | Attending: Internal Medicine | Admitting: Internal Medicine

## 2020-08-19 DIAGNOSIS — K7460 Unspecified cirrhosis of liver: Secondary | ICD-10-CM | POA: Diagnosis not present

## 2020-08-19 DIAGNOSIS — K769 Liver disease, unspecified: Secondary | ICD-10-CM | POA: Insufficient documentation

## 2020-08-19 DIAGNOSIS — K8020 Calculus of gallbladder without cholecystitis without obstruction: Secondary | ICD-10-CM | POA: Diagnosis not present

## 2020-08-19 DIAGNOSIS — I868 Varicose veins of other specified sites: Secondary | ICD-10-CM | POA: Diagnosis not present

## 2020-08-19 MED ORDER — GADOBUTROL 1 MMOL/ML IV SOLN
10.0000 mL | Freq: Once | INTRAVENOUS | Status: AC | PRN
  Administered 2020-08-19: 10 mL via INTRAVENOUS

## 2020-08-19 NOTE — Progress Notes (Signed)
[facility] today at [facility] for [facility] liver w wo contrast. Patient has St.jude device, gallant model. Jarrett Soho Rep at the bedside to perform device check. Verbal orders from Andy-Cardiology PA for DOO 85, defibrillation off.

## 2020-08-19 NOTE — Progress Notes (Signed)
Informed of MRI for today.   Device system confirmed to be MRI conditional, with implant date > 6 weeks ago, and no evidence of abandoned or epicardial leads in review of most recent CXR Interrogation from today reviewed over the phone with [employer] rep.   I agree with their recommendation to change device settings for MRI to DOO at 85 bpm  Tachy-therapies to off if applicable.  Program device back to pre-MRI settings after completion of exam.  Shirley Friar, PA-C  08/19/2020 1:19 PM

## 2020-08-20 ENCOUNTER — Other Ambulatory Visit: Payer: Self-pay

## 2020-08-20 DIAGNOSIS — K769 Liver disease, unspecified: Secondary | ICD-10-CM

## 2020-08-21 ENCOUNTER — Inpatient Hospital Stay: Payer: PPO | Admitting: Internal Medicine

## 2020-08-21 ENCOUNTER — Inpatient Hospital Stay: Payer: PPO | Attending: Internal Medicine

## 2020-08-21 DIAGNOSIS — K7460 Unspecified cirrhosis of liver: Secondary | ICD-10-CM | POA: Diagnosis not present

## 2020-08-21 DIAGNOSIS — E1122 Type 2 diabetes mellitus with diabetic chronic kidney disease: Secondary | ICD-10-CM | POA: Insufficient documentation

## 2020-08-21 DIAGNOSIS — K766 Portal hypertension: Secondary | ICD-10-CM | POA: Diagnosis not present

## 2020-08-21 DIAGNOSIS — N183 Chronic kidney disease, stage 3 unspecified: Secondary | ICD-10-CM | POA: Diagnosis not present

## 2020-08-21 DIAGNOSIS — K769 Liver disease, unspecified: Secondary | ICD-10-CM | POA: Diagnosis not present

## 2020-08-21 DIAGNOSIS — Z8549 Personal history of malignant neoplasm of other male genital organs: Secondary | ICD-10-CM | POA: Insufficient documentation

## 2020-08-21 DIAGNOSIS — I130 Hypertensive heart and chronic kidney disease with heart failure and stage 1 through stage 4 chronic kidney disease, or unspecified chronic kidney disease: Secondary | ICD-10-CM | POA: Diagnosis not present

## 2020-08-21 LAB — COMPREHENSIVE METABOLIC PANEL
ALT: 25 U/L (ref 0–44)
AST: 33 U/L (ref 15–41)
Albumin: 3.6 g/dL (ref 3.5–5.0)
Alkaline Phosphatase: 116 U/L (ref 38–126)
Anion gap: 10 (ref 5–15)
BUN: 25 mg/dL — ABNORMAL HIGH (ref 8–23)
CO2: 25 mmol/L (ref 22–32)
Calcium: 8.8 mg/dL — ABNORMAL LOW (ref 8.9–10.3)
Chloride: 101 mmol/L (ref 98–111)
Creatinine, Ser: 1.7 mg/dL — ABNORMAL HIGH (ref 0.61–1.24)
GFR, Estimated: 42 mL/min — ABNORMAL LOW (ref >60)
Glucose, Bld: 131 mg/dL — ABNORMAL HIGH (ref 70–99)
Potassium: 4.2 mmol/L (ref 3.5–5.1)
Sodium: 136 mmol/L (ref 135–145)
Total Bilirubin: 1 mg/dL (ref 0.3–1.2)
Total Protein: 7.1 g/dL (ref 6.5–8.1)

## 2020-08-21 LAB — CBC WITH DIFFERENTIAL/PLATELET
Abs Immature Granulocytes: 0.02 10*3/uL (ref 0.00–0.07)
Basophils Absolute: 0.1 10*3/uL (ref 0.0–0.1)
Basophils Relative: 1 %
Eosinophils Absolute: 0.1 10*3/uL (ref 0.0–0.5)
Eosinophils Relative: 2 %
HCT: 43.1 % (ref 39.0–52.0)
Hemoglobin: 14.8 g/dL (ref 13.0–17.0)
Immature Granulocytes: 0 %
Lymphocytes Relative: 28 %
Lymphs Abs: 1.9 10*3/uL (ref 0.7–4.0)
MCH: 32.5 pg (ref 26.0–34.0)
MCHC: 34.3 g/dL (ref 30.0–36.0)
MCV: 94.7 fL (ref 80.0–100.0)
Monocytes Absolute: 0.6 10*3/uL (ref 0.1–1.0)
Monocytes Relative: 9 %
Neutro Abs: 4.1 10*3/uL (ref 1.7–7.7)
Neutrophils Relative %: 60 %
Platelets: 162 10*3/uL (ref 150–400)
RBC: 4.55 MIL/uL (ref 4.22–5.81)
RDW: 13.5 % (ref 11.5–15.5)
WBC: 6.8 10*3/uL (ref 4.0–10.5)
nRBC: 0 % (ref 0.0–0.2)

## 2020-08-21 NOTE — Assessment & Plan Note (Addendum)
#  Liver lesion-segment 7 MRI July 2022-showed significant interval enlargement of the arterially enhancing lesion in the anterior liver dome measuring about 2 cm in size; this was previously approximately 1 cm in size.  This is highly concerning for hepatocellular carcinoma in the context of his underlying cirrhosis.  Patient's lesion previously thought to be not amenable to IR liver biopsy.  We will review at tumor conference.  #I had a long discussion with the patient regarding the significant concerns for hepatocellular cancer based on imaging.  Recommend surgical evaluation for resection.  If patient is not a surgical candidate/or if patient declined surgery-ablation with IR would be reasonable option.   # Hepatic cirrhosis and findings of portal venous hypertension-on MRI-no evidence of decompensation.  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  Stable.  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  Stable no clinical evidence of recurrence.  Unlikely cause of patient's liver lesion.  # CKD- stage III-GFR 42 overall stable.  # DISPOSITION: # Follow up TBD- Dr.B/Kristie Delia Chimes.   Addendum: I spoke to patient's daughter [name] updated her of the patient's status/results of MRI.  Discussed the option of surgical evaluation.  They are interested in evaluation with surgery in [place].  We will make a referral.  Discussed with Roderic Ovens.  We will also discuss at tumor conference.   # I reviewed the blood work- with the patient in detail; also reviewed the imaging independently [as summarized above]; and with the patient in detail.   # 40 minutes face-to-face with the patient discussing the above plan of care; more than 50% of time spent on prognosis/ natural history; counseling and coordination.

## 2020-08-21 NOTE — Progress Notes (Signed)
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 Cause ? NASH; GI w/u- [place] 2017];   # colonoscopy-dec 2020 polyps [Dr.Wohl]; s/p resection; EGD - bleeding ulcer- ? Asprin [2016; Dr.Buccini; [place]]; CKD- III [40s]; CAD [Dr.Arida]   History of penile cancer  06/28/2015 Initial Diagnosis   Penile cancer (HCC)      HISTORY OF PRESENTING ILLNESS:  Devin Hanna 70 y.o.  male with a history of cirrhosis; stage I penile cancer; also history of chronic kidney disease; and a history of indeterminate liver lesion is here for follow-up/review results of the MRI.  Patient denies any new shortness of breath or cough.  Denies any swelling in the legs.  Denies any nausea vomiting.  Continues to have chronic back pain not any worse.  Otherwise fairly active.  Review of Systems  Constitutional:  Negative for chills, diaphoresis, fever, malaise/fatigue and weight loss.  HENT:  Negative for nosebleeds and sore throat.   Eyes:  Negative for double vision.  Respiratory:  Negative for cough, hemoptysis, sputum production, shortness of breath and wheezing.   Cardiovascular:  Negative for chest pain, palpitations, orthopnea and leg swelling.  Gastrointestinal:  Negative for abdominal pain, blood in stool, constipation, diarrhea, heartburn, melena, nausea and vomiting.  Genitourinary:  Negative for dysuria, frequency and urgency.  Musculoskeletal:  Positive for back pain and joint  pain.  Skin: Negative.  Negative for itching and rash.  Neurological:  Negative for dizziness, tingling, focal weakness, weakness and headaches.  Endo/Heme/Allergies:  Does not bruise/bleed easily.  Psychiatric/Behavioral:  Negative for depression. The patient is not nervous/anxious and does not have insomnia.     MEDICAL HISTORY:  Past Medical History:  Diagnosis Date   Acute respiratory failure with hypoxia ([place]) 06/2016   CAD (coronary artery disease)    a. Remote PCI of the RCA;  b. 03/2013 Inf STEMI/PCI: LAD 60-70d, RCA 99p ISR(rota/CBA/DES);  c. 09/2014 MV: inf and infsept infarct, EF 35-40%;  d. 09/2014 Cath: patent RCA stent, stable LAD dzs, EF 40%-->Med rx.   CHF (congestive heart failure) ([place])    Diabetes mellitus    sees Dr. Hardin Negus in [place]   GERD (gastroesophageal reflux disease)    HFrEF (heart failure with reduced ejection fraction) ([place])    Hyperlipidemia    Hypertension    Ischemic cardiomyopathy    a. 09/2014 EF 40% by V gram;  b. 06/2016 Echo: EF 40, diff HK, Gr1 DD, inf HK, no mural thrombus. Mild MR, mildly dil LA, mildly dil RV.    LBBB (left bundle branch block)    Old   Liver mass    Morbid obesity ([place])    Peripheral neuropathy    Upper GI bleed    Vertigo     SURGICAL HISTORY: Past Surgical History:  Procedure Laterality Date   BIV ICD INSERTION CRT-D N/A 04/08/2020   Procedure: BIV ICD INSERTION CRT-D;  Surgeon: Vickie Epley, MD;  Location: [facility];  Service: Cardiovascular;  Laterality: N/A;   CARDIAC CATHETERIZATION N/A 10/03/2014   Procedure: Left Heart Cath and Coronary Angiography;  Surgeon: [name], MD;  Location: [facility];  Service: Cardiovascular;  Laterality: N/A;   CIRCUMCISION N/A 07/22/2015   Procedure: CIRCUMCISION ADULT;  Surgeon: Hollice Espy, MD;  Location: [facility] ORS;  Service: Urology;  Laterality: N/A;   COLONOSCOPY WITH PROPOFOL N/A 01/12/2019   Procedure: COLONOSCOPY WITH PROPOFOL;  Surgeon:  Lucilla Lame, MD;  Location: [facility] ENDOSCOPY;  Service: Endoscopy;  Laterality: N/A;   CORONARY STENT PLACEMENT     ESOPHAGOGASTRODUODENOSCOPY (EGD) WITH PROPOFOL N/A 09/12/2014   Procedure: ESOPHAGOGASTRODUODENOSCOPY (EGD) WITH PROPOFOL;  Surgeon: Ronald Lobo, MD;  Location: [facility] ENDOSCOPY;  Service: Endoscopy;  Laterality: N/A;   EXTERNAL FIXATION LEG Left 04/03/2012   Procedure: EXTERNAL FIXATION LEG;  Surgeon: Wylene Simmer, MD;  Location: [facility];  Service: Orthopedics;  Laterality: Left;   EXTERNAL FIXATION REMOVAL Left 04/14/2012   Procedure: REMOVAL EXTERNAL FIXATION LEG;  Surgeon: Wylene Simmer, MD;  Location: [facility];  Service: Orthopedics;  Laterality: Left;   IRRIGATION AND DEBRIDEMENT KNEE Left 04/03/2012   Procedure: IRRIGATION AND DEBRIDEMENT KNEE;  Surgeon: Wylene Simmer, MD;  Location: [facility];  Service: Orthopedics;  Laterality: Left;   LEFT HEART CATHETERIZATION WITH CORONARY ANGIOGRAM N/A 04/05/2013   Procedure: LEFT HEART CATHETERIZATION WITH CORONARY ANGIOGRAM;  Surgeon: [name], MD;  Location: [facility] CATH LAB;  Service: Cardiovascular;  Laterality: N/A;   MASS EXCISION N/A 07/22/2015   Procedure: EXCISION PENILE MASS;  Surgeon: Hollice Espy, MD;  Location: [facility] ORS;  Service: Urology;  Laterality: N/A;   ORIF TIBIA PLATEAU Left 04/14/2012   Procedure: OPEN REDUCTION INTERNAL FIXATION (ORIF) TIBIAL PLATEAU;  Surgeon: Wylene Simmer, MD;  Location: [facility];  Service: Orthopedics;  Laterality: Left;   PERCUTANEOUS CORONARY ROTOBLATOR INTERVENTION (PCI-R) N/A 04/06/2013   Procedure: PERCUTANEOUS CORONARY ROTOBLATOR INTERVENTION (PCI-R);  Surgeon: [name], MD;  Location: [facility] CATH LAB;  Service: Cardiovascular;  Laterality: N/A;   RIGHT/LEFT HEART CATH AND CORONARY ANGIOGRAPHY N/A 08/21/2019   Procedure: RIGHT/LEFT HEART CATH AND CORONARY ANGIOGRAPHY;  Surgeon: [name], MD;  Location: [place] CV LAB;  Service: Cardiovascular;  Laterality: N/A;    SOCIAL HISTORY: Social  History   Socioeconomic History   Marital status: Divorced    Spouse name: Not on file   Number of children: Not on file   Years of education: Not on file   Highest education level: Not on file  Occupational History   Occupation: heavy Company secretary    Employer: [employer]  Tobacco Use   Smoking status: Former    Packs/day: 3.00    Years: 40.00    Pack years: 120.00    Types: Cigarettes    Quit date: 04/05/1995    Years since quitting: 25.4   Smokeless tobacco: Former   Tobacco comments:    started smoking at age 25  Vaping Use   Vaping Use: Never used  Substance and Sexual Activity   Alcohol use: No    Comment: Occassional Use   Drug use: No   Sexual activity: Not on file  Other Topics Concern   Not on file  Social History Narrative   Heavy Company secretary.; Divorced; quit smoking 25 years; beer rare; by self. Still riding motor cycles.    Social Determinants of Health   Financial Resource Strain: Medium Risk   Difficulty of Paying Living Expenses: Somewhat hard  Food Insecurity: Not on file  Transportation Needs: Not on file  Physical Activity: Not on file  Stress: Not on file  Social Connections: Not on file  Intimate Partner Violence: Not on file    FAMILY HISTORY: Family History  Problem Relation Age of Onset   Coronary artery disease Father    Coronary artery disease Brother    Lung cancer Brother    Breast cancer Sister    Prostate cancer Brother    Stroke Sister     ALLERGIES:  has No Known Allergies.  MEDICATIONS:  Current Outpatient Medications  Medication Sig Dispense Refill   atorvastatin (LIPITOR) 40 MG tablet Take 1 tablet (40 mg total) by mouth daily. 90 tablet 0   carvedilol (COREG) 6.25 MG tablet TAKE 1 TABLET BY MOUTH TWICE DAILY WITH A MEAL 180 tablet 0   CINNAMON PO Take 1,000 mg by mouth in the morning and at bedtime.     clopidogrel (PLAVIX) 75 MG tablet TAKE ONE TABLET EVERY MORNING WITH BREAKFAST 90 tablet 3    dapagliflozin propanediol ([name]) 10 MG TABS tablet Take 1 tablet (10 mg total) by mouth daily before breakfast. 30 tablet 3   Dulaglutide ([name]) 3 WF/0.9NA SOPN Inject 3 mg as directed once a week. 6 mL 0   ferrous sulfate 325 (65 FE) MG tablet Take 162.5 mg by mouth daily with breakfast.     furosemide (LASIX) 40 MG tablet Take 1 tablet (40 mg total) by mouth daily. 90 tablet 3   gabapentin (NEURONTIN) 300 MG capsule TAKE 1 CAPSULE BY MOUTH 3 TIMES DAILY 90 capsule 3   Multiple Vitamin (MULTIVITAMIN WITH MINERALS) TABS Take 1 tablet by mouth daily. Multivitamin for Men 50+     nitroGLYCERIN (NITROSTAT) 0.4 MG SL tablet Place 1 tablet (0.4 mg total) under the tongue every 5 (five) minutes as needed for chest pain. 25 tablet 0   pantoprazole (PROTONIX) 40 MG tablet TAKE ONE TABLET TWICE DAILY 180 tablet 1   sacubitril-valsartan (ENTRESTO) 49-51 MG Take 1 tablet by mouth 2 (two) times daily. 180 tablet 3   No current facility-administered medications for this visit.      Marland Kitchen  PHYSICAL EXAMINATION: ECOG PERFORMANCE STATUS: 0 - Asymptomatic  Vitals:   08/21/20 1052  BP: 106/68  Pulse: 62  Resp: 16  Temp: 98.1 F (36.7 C)  SpO2: 99%   Filed Weights   08/21/20 1052  Weight: 234 lb (106.1 kg)    Physical Exam Constitutional:      Comments: Patient is alone.  Is ambulating independently.  HENT:     Head: Normocephalic and atraumatic.     Mouth/Throat:     Pharynx: No oropharyngeal exudate.  Eyes:     Pupils: Pupils are equal, round, and reactive to light.  Cardiovascular:     Rate and Rhythm: Normal rate and regular rhythm.  Pulmonary:     Effort: Pulmonary effort is normal. No respiratory distress.     Breath sounds: Normal breath sounds. No wheezing.  Abdominal:     General: Bowel sounds are normal. There is no distension.     Palpations: Abdomen is soft. There is no mass.     Tenderness: no abdominal tenderness There is no guarding or rebound.  Musculoskeletal:         General: No tenderness. Normal range of motion.     Cervical back: Normal range of motion and neck supple.  Skin:    General: Skin is warm.  Neurological:     Mental Status: He is  alert and oriented to person, place, and time.  Psychiatric:        Mood and Affect: Affect normal.     LABORATORY DATA:  I have reviewed the data as listed Lab Results  Component Value Date   WBC 6.8 08/21/2020   HGB 14.8 08/21/2020   HCT 43.1 08/21/2020   MCV 94.7 08/21/2020   PLT 162 08/21/2020   Recent Labs    09/07/19 0952 09/22/19 0801 01/22/20 1109 04/04/20 0813 07/05/20 1503 07/29/20 0000 08/01/20 1129 08/21/20 1028  NA 140   < > 136 136 139  --  137 136  K 4.7   < > 4.1 4.2 4.4  --  4.3 4.2  CL 101   < > 99 102 100  --  103 101  CO2 22   < > 26 25 23   --  25 25  GLUCOSE 152*   < > 150* 131* 130*  --  131* 131*  BUN 25   < > 17 18 21   --  15 25*  CREATININE 1.74*   < > 1.32* 1.37* 1.40*  --  1.22 1.70*  CALCIUM 9.4   < > 8.9 9.1 9.3  --  8.9 8.8*  GFRNONAA 39*   < > 58* 55*  --   --  >60 42*  GFRAA 45*  --   --   --   --   --   --   --   PROT  --   --  7.1  --  6.6  --   --  7.1  ALBUMIN  --   --  3.5  --  3.8  --   --  3.6  AST  --   --  52*  --  40  --   --  33  ALT  --   --  45*  --  36  --   --  25  ALKPHOS  --    < > 134*  --  165* 153*  --  116  BILITOT  --   --  0.8  --  0.6  --   --  1.0   < > = values in this interval not displayed.    RADIOGRAPHIC STUDIES: I have personally reviewed the radiological images as listed and agreed with the findings in the report. MR LIVER W WO CONTRAST  Result Date: 08/20/2020 CLINICAL DATA:  Cirrhosis, LI-RADS 3 indeterminate liver lesions EXAM: MRI ABDOMEN WITHOUT AND WITH CONTRAST TECHNIQUE: Multiplanar multisequence MR imaging of the abdomen was performed both before and after the administration of intravenous contrast. CONTRAST:  82m GADAVIST GADOBUTROL 1 MMOL/ML IV SOLN COMPARISON:  01/22/2020 FINDINGS: Lower chest: No acute  findings. Hepatobiliary: Coarse, nodular cirrhotic contour of the liver. Significant interval enlargement of an arterially hyperenhancing lesion of the anterior liver dome, hepatic segment VII, measuring 1.9 x 1.5 cm, previously no greater than 1.0 cm (series 15, image 28). No evidence of washout or capsular enhancement. Multiple additional subcentimeter hyperenhancing lesions of the liver are unchanged. Small gallstones near the gallbladder neck. No gallbladder wall thickening. No biliary ductal dilatation. Pancreas: No mass, inflammatory changes, or other parenchymal abnormality identified. Spleen: Within normal limits in size and appearance. Splenic varices. Adrenals/Urinary Tract: No masses identified. No evidence of hydronephrosis. Stomach/Bowel: Visualized portions within the abdomen are unremarkable. Vascular/Lymphatic: No pathologically enlarged lymph nodes identified. Aortic atherosclerosis. No abdominal aortic aneurysm demonstrated. Other:  None. Musculoskeletal: No suspicious bone lesions identified. IMPRESSION: 1. Significant interval  enlargement of an arterially hyperenhancing lesion of the anterior liver dome, hepatic segment VII, measuring 1.9 x 1.5 cm, previously no greater than 1.0 cm. This constitutes threshold growth, however there is no evidence of washout or capsular enhancement. LI-RADS category 5, consistent with hepatocellular carcinoma. 2. Multiple additional subcentimeter hyperenhancing lesions of the liver are unchanged, which likely reflect regenerative or dysplastic nodules in the setting of cirrhosis and remain intermediate in suspicion for hepatocellular carcinoma, LI-RADS category 3. 3. Cirrhosis. 4. Cholelithiasis. Aortic Atherosclerosis (ICD10-I70.0). Electronically Signed   By: Eddie Candle M.D.   On: 08/20/2020 13:56   ECHOCARDIOGRAM COMPLETE  Result Date: 08/15/2020    ECHOCARDIOGRAM REPORT   Patient Name:   Devin Hanna Date of Exam: 08/15/2020 Medical Rec #:  [id number]       Height:       70.0 in Accession #:    [id number]     Weight:       235.4 lb Date of Birth:  [birth year]      BSA:          2.237 m Patient Age:    56 years       BP:           124/88 mmHg Patient Gender: M              HR:           67 bpm. Exam Location:  [facility] Procedure: 2D Echo, Cardiac Doppler, Color Doppler and Intracardiac            Opacification Agent Indications:    I50.20* Unspecified systolic (congestive) heart failure  History:        Patient has prior history of Echocardiogram examinations, most                 recent 02/20/2020. CHF and Cardiomyopathy, CAD, Defibrillator,                 Arrythmias:LBBB; Risk Factors:Hypertension, Dyslipidemia, Former                 Smoker and Diabetes.  Sonographer:    Pilar Jarvis RDMS, RVT, RDCS Referring Phys: [id number] Hilton Cork LAMBERT IMPRESSIONS  1. Left ventricular ejection fraction, by estimation, is 50 to 55%. The left ventricle has low normal function. The left ventricle has no regional wall motion abnormalities. Left ventricular diastolic parameters are consistent with Grade I diastolic dysfunction (impaired relaxation).  2. Right ventricular systolic function is normal. The right ventricular size is normal. Tricuspid regurgitation signal is inadequate for assessing PA pressure.  3. Left atrial size was mildly dilated.  4. The mitral valve is normal in structure. Mild mitral valve regurgitation. FINDINGS  Left Ventricle: Left ventricular ejection fraction, by estimation, is 50 to 55%. The left ventricle has low normal function. The left ventricle has no regional wall motion abnormalities. Definity contrast agent was given IV to delineate the left ventricular endocardial borders. The left ventricular internal cavity size was normal in size. There is no left ventricular hypertrophy. Left ventricular diastolic parameters are consistent with Grade I diastolic dysfunction (impaired relaxation). Right Ventricle: The right ventricular size is normal. No increase  in right ventricular wall thickness. Right ventricular systolic function is normal. Tricuspid regurgitation signal is inadequate for assessing PA pressure. Left Atrium: Left atrial size was mildly dilated. Right Atrium: Right atrial size was normal in size. Pericardium: There is no evidence of pericardial effusion. Mitral Valve: The mitral valve is normal in structure. Mild mitral  valve regurgitation. No evidence of mitral valve stenosis. Tricuspid Valve: The tricuspid valve is normal in structure. Tricuspid valve regurgitation is not demonstrated. No evidence of tricuspid stenosis. Aortic Valve: The aortic valve is normal in structure. Aortic valve regurgitation is not visualized. No aortic stenosis is present. Aortic valve mean gradient measures 3.0 mmHg. Aortic valve peak gradient measures 5.0 mmHg. Aortic valve area, by VTI measures 3.93 cm. Pulmonic Valve: The pulmonic valve was normal in structure. Pulmonic valve regurgitation is not visualized. No evidence of pulmonic stenosis. Aorta: The aortic root is normal in size and structure. Venous: The pulmonary veins were not well visualized. The inferior vena cava is normal in size with greater than 50% respiratory variability, suggesting right atrial pressure of 3 mmHg. IAS/Shunts: No atrial level shunt detected by color flow Doppler.  LEFT VENTRICLE PLAX 2D LVOT diam:     2.20 cm      Diastology LV SV:         91           LV e' medial:    5.44 cm/s LV SV Index:   41           LV E/e' medial:  14.3 LVOT Area:     3.80 cm     LV e' lateral:   5.98 cm/s                             LV E/e' lateral: 13.0  LV Volumes (MOD) LV vol d, MOD A4C: 173.0 ml LV SV MOD A4C:     173.0 ml RIGHT VENTRICLE RV S prime:     12.70 cm/s TAPSE (M-mode): 2.5 cm LEFT ATRIUM             Index LA diam:        4.70 cm 2.10 cm/m LA Vol (A2C):   47.5 ml 21.23 ml/m LA Vol (A4C):   76.0 ml 33.98 ml/m LA Biplane Vol: 61.9 ml 27.67 ml/m  AORTIC VALVE                   PULMONIC VALVE AV Area  (Vmax):    3.77 cm    PV Vmax:       0.84 m/s AV Area (Vmean):   3.45 cm    PV Peak grad:  2.8 mmHg AV Area (VTI):     3.93 cm AV Vmax:           112.00 cm/s AV Vmean:          79.600 cm/s AV VTI:            0.232 m AV Peak Grad:      5.0 mmHg AV Mean Grad:      3.0 mmHg LVOT Vmax:         111.00 cm/s LVOT Vmean:        72.200 cm/s LVOT VTI:          0.240 m LVOT/AV VTI ratio: 1.03  AORTA Ao Root diam: 3.50 cm Ao Asc diam:  3.40 cm Ao Arch diam: 2.5 cm MITRAL VALVE MV Area (PHT): 4.80 cm     SHUNTS MV Decel Time: 158 msec     Systemic VTI:  0.24 m MV E velocity: 78.00 cm/s   Systemic Diam: 2.20 cm MV A velocity: 110.00 cm/s MV E/A ratio:  0.71 Ida Rogue MD Electronically signed by Ida Rogue MD Signature Date/Time: 08/15/2020/4:48:04 PM  Final      ASSESSMENT & PLAN:   Liver lesion #Liver lesion-segment 7 MRI July 2022-showed significant interval enlargement of the arterially enhancing lesion in the anterior liver dome measuring about 2 cm in size; this was previously approximately 1 cm in size.  This is highly concerning for hepatocellular carcinoma in the context of his underlying cirrhosis.  Patient's lesion previously thought to be not amenable to IR liver biopsy.  We will review at tumor conference.  #I had a long discussion with the patient regarding the significant concerns for hepatocellular cancer based on imaging.  Recommend surgical evaluation for resection.  If patient is not a surgical candidate/or if patient declined surgery-ablation with IR would be reasonable option.   # Hepatic cirrhosis and findings of portal venous hypertension-on MRI-no evidence of decompensation.  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  Stable.  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  Stable no clinical evidence of recurrence.  Unlikely cause of patient's liver lesion.  # CKD- stage III-GFR 42 overall stable.  # DISPOSITION: # Follow up TBD- Dr.B/Kristie Delia Chimes.   Addendum: I spoke  to patient's daughter [name] updated her of the patient's status/results of MRI.  Discussed the option of surgical evaluation.  They are interested in evaluation with surgery in [place].  We will make a referral.  Discussed with Roderic Ovens.  We will also discuss at tumor conference.   # I reviewed the blood work- with the patient in detail; also reviewed the imaging independently [as summarized above]; and with the patient in detail.   # 40 minutes face-to-face with the patient discussing the above plan of care; more than 50% of time spent on prognosis/ natural history; counseling and coordination.   All questions were answered. The patient knows to call the clinic with any problems, questions or concerns.    Cammie Sickle, MD 08/22/2020 11:10 PM

## 2020-08-21 NOTE — Progress Notes (Signed)
Follow-up liver lesion and MRI results. Pt has no new concerns or changes since last visit.

## 2020-08-22 ENCOUNTER — Telehealth: Payer: Self-pay

## 2020-08-22 LAB — AFP TUMOR MARKER: AFP, Serum, Tumor Marker: 6.9 ng/mL (ref 0.0–8.4)

## 2020-08-22 NOTE — Telephone Encounter (Signed)
Voicemail left with daughter, [name], to return call regarding referral to surgeon.

## 2020-08-23 ENCOUNTER — Telehealth: Payer: Self-pay

## 2020-08-23 DIAGNOSIS — K769 Liver disease, unspecified: Secondary | ICD-10-CM

## 2020-08-23 NOTE — Telephone Encounter (Signed)
Spoke with daughter, [name], and referral has been sent to [facility]. Will follow up regarding date.

## 2020-08-24 ENCOUNTER — Other Ambulatory Visit: Payer: Self-pay | Admitting: Family Medicine

## 2020-08-24 DIAGNOSIS — R945 Abnormal results of liver function studies: Secondary | ICD-10-CM

## 2020-08-24 DIAGNOSIS — R7989 Other specified abnormal findings of blood chemistry: Secondary | ICD-10-CM

## 2020-08-27 ENCOUNTER — Telehealth: Payer: Self-pay

## 2020-08-27 NOTE — Telephone Encounter (Signed)
Devin Hanna has been scheduled to see Dr. Barry Dienes 09/02/20 at 1030.

## 2020-08-28 DIAGNOSIS — M12562 Traumatic arthropathy, left knee: Secondary | ICD-10-CM | POA: Diagnosis not present

## 2020-08-28 DIAGNOSIS — M25562 Pain in left knee: Secondary | ICD-10-CM | POA: Diagnosis not present

## 2020-08-29 ENCOUNTER — Telehealth: Payer: Self-pay | Admitting: Family Medicine

## 2020-08-29 NOTE — Telephone Encounter (Signed)
LVM for patients daughter  to call back.  Devin Hanna,cma

## 2020-08-29 NOTE — Telephone Encounter (Signed)
Patient 's daughter [name] called. Patient has an appointment coming up for general surgery consult, but he also has a referral, no appt with GI. Does he need to see GI?

## 2020-09-02 DIAGNOSIS — K7469 Other cirrhosis of liver: Secondary | ICD-10-CM | POA: Diagnosis not present

## 2020-09-02 DIAGNOSIS — K769 Liver disease, unspecified: Secondary | ICD-10-CM | POA: Diagnosis not present

## 2020-09-03 ENCOUNTER — Other Ambulatory Visit: Payer: Self-pay | Admitting: General Surgery

## 2020-09-03 DIAGNOSIS — K769 Liver disease, unspecified: Secondary | ICD-10-CM

## 2020-09-04 ENCOUNTER — Telehealth: Payer: Self-pay

## 2020-09-04 ENCOUNTER — Other Ambulatory Visit (HOSPITAL_COMMUNITY): Payer: Self-pay

## 2020-09-04 NOTE — Telephone Encounter (Signed)
Called and spoke with patient's daughter, [name], she is agreeable to appt 09/05/20 at 3 pm for patient and will let pt know.

## 2020-09-04 NOTE — Telephone Encounter (Signed)
LVM for patient's daughter to call back to discuss GI referral.  Evelio Rueda,cma

## 2020-09-04 NOTE — Telephone Encounter (Signed)
[place] alert for AT/AF > threshold.  Event started 7/25, CVR. Presenting AF/BP, burden 3.5% This is new, no OAC.    Spoke with pt daughter, [name] (DPR on file).  Pt does not have a history of documented AF.  She ahs not spoken to pt since Monday so she is unsure if he is symptomatc.  Advised of symptoms to assess for and ED precautions.  Provided callback # to call me back after speaking with pt.    Current meds include: Carvedilol 6.66m BID, Plavix 75 mg daily, Entresto 49-520mBID  Advised will send report to Dr. LaQuentin Oreor further review./ recommendations.     Onset

## 2020-09-04 NOTE — Telephone Encounter (Signed)
Spoke with pt daughter, [name].  She states she hasn't been able to speak with patient yet, however she has family and friends trying to track him down.  Patient will often go out to help friends and it is evident he left his home this morning, however he isnt answering his phone.  This is not uncommon for him as he doesn't always hear the phone.    Ambrose Pancoast of Dr. Quentin Ore recommendation for AF clinic referral.  She is agreeable to this, advised she should receive contact to establish the appt.

## 2020-09-05 ENCOUNTER — Ambulatory Visit (HOSPITAL_COMMUNITY)
Admission: RE | Admit: 2020-09-05 | Discharge: 2020-09-05 | Disposition: A | Discharge status: Home / Self Care | Payer: PPO | Source: Ambulatory Visit | Attending: Physician Assistant | Admitting: Physician Assistant

## 2020-09-05 ENCOUNTER — Other Ambulatory Visit: Payer: Self-pay

## 2020-09-05 VITALS — BP 94/58 | HR 70 | Ht 70.0 in | Wt 234.6 lb

## 2020-09-05 DIAGNOSIS — E669 Obesity, unspecified: Secondary | ICD-10-CM | POA: Insufficient documentation

## 2020-09-05 DIAGNOSIS — Z6833 Body mass index (BMI) 33.0-33.9, adult: Secondary | ICD-10-CM | POA: Insufficient documentation

## 2020-09-05 DIAGNOSIS — I2510 Atherosclerotic heart disease of native coronary artery without angina pectoris: Secondary | ICD-10-CM | POA: Insufficient documentation

## 2020-09-05 DIAGNOSIS — Z87891 Personal history of nicotine dependence: Secondary | ICD-10-CM | POA: Insufficient documentation

## 2020-09-05 DIAGNOSIS — K7460 Unspecified cirrhosis of liver: Secondary | ICD-10-CM | POA: Insufficient documentation

## 2020-09-05 DIAGNOSIS — E119 Type 2 diabetes mellitus without complications: Secondary | ICD-10-CM | POA: Diagnosis not present

## 2020-09-05 DIAGNOSIS — I110 Hypertensive heart disease with heart failure: Secondary | ICD-10-CM | POA: Diagnosis not present

## 2020-09-05 DIAGNOSIS — I4819 Other persistent atrial fibrillation: Secondary | ICD-10-CM | POA: Insufficient documentation

## 2020-09-05 DIAGNOSIS — R160 Hepatomegaly, not elsewhere classified: Secondary | ICD-10-CM | POA: Insufficient documentation

## 2020-09-05 DIAGNOSIS — Z95810 Presence of automatic (implantable) cardiac defibrillator: Secondary | ICD-10-CM | POA: Insufficient documentation

## 2020-09-05 DIAGNOSIS — I5022 Chronic systolic (congestive) heart failure: Secondary | ICD-10-CM | POA: Diagnosis not present

## 2020-09-05 DIAGNOSIS — Z8249 Family history of ischemic heart disease and other diseases of the circulatory system: Secondary | ICD-10-CM | POA: Diagnosis not present

## 2020-09-05 DIAGNOSIS — D6869 Other thrombophilia: Secondary | ICD-10-CM | POA: Insufficient documentation

## 2020-09-05 DIAGNOSIS — Z79899 Other long term (current) drug therapy: Secondary | ICD-10-CM | POA: Insufficient documentation

## 2020-09-05 DIAGNOSIS — I4891 Unspecified atrial fibrillation: Secondary | ICD-10-CM | POA: Diagnosis not present

## 2020-09-05 DIAGNOSIS — E785 Hyperlipidemia, unspecified: Secondary | ICD-10-CM | POA: Insufficient documentation

## 2020-09-05 NOTE — Progress Notes (Signed)
Primary Care Physician: [name], MD Primary Cardiologist: Dr Fletcher Anon Primary Electrophysiologist: Dr Quentin Ore Referring Physician: Dr Theodoro Kalata Devin Hanna is a 70 y.o. male with a history of chronic systolic CHF s/p CRT-D, CAD, DM, HTN, HLD, prior GI bleed (ulcers), hepatic cirrhosis, and atrial fibrillation who presents for follow up in the [facility].  The patient was initially diagnosed with atrial fibrillation 09/04/20. The device clinic received an alert for an ongoing afib episode starting 09/02/20. Patient has a CHADS2VASC score of 5. Patient is unaware of his arrhythmia. He has been more stressed recently with the uncertainty about his liver scan results.   Today, he denies symptoms of palpitations, chest pain, shortness of breath, orthopnea, PND, lower extremity edema, dizziness, presyncope, syncope, snoring, daytime somnolence, bleeding, or neurologic sequela. The patient is tolerating medications without difficulties and is otherwise without complaint today.    Atrial Fibrillation Risk Factors:  he does not have symptoms or diagnosis of sleep apnea. he does not have a history of rheumatic fever.   he has a BMI of Body mass index is 33.66 kg/m.Marland Kitchen Filed Weights   09/05/20 1447  Weight: 106.4 kg    Family History  Problem Relation Age of Onset   Coronary artery disease Father    Coronary artery disease Brother    Lung cancer Brother    Breast cancer Sister    Prostate cancer Brother    Stroke Sister      Atrial Fibrillation Management history:  Previous antiarrhythmic drugs: none Previous cardioversions: none Previous ablations: none CHADS2VASC score: 5 Anticoagulation history: none   Past Medical History:  Diagnosis Date   Acute respiratory failure with hypoxia ([place]) 06/2016   CAD (coronary artery disease)    a. Remote PCI of the RCA;  b. 03/2013 Inf STEMI/PCI: LAD 60-70d, RCA 99p ISR(rota/CBA/DES);  c. 09/2014 MV: inf  and infsept infarct, EF 35-40%;  d. 09/2014 Cath: patent RCA stent, stable LAD dzs, EF 40%-->Med rx.   CHF (congestive heart failure) ([place])    Diabetes mellitus    sees Dr. Hardin Negus in [place]   GERD (gastroesophageal reflux disease)    HFrEF (heart failure with reduced ejection fraction) ([place])    Hyperlipidemia    Hypertension    Ischemic cardiomyopathy    a. 09/2014 EF 40% by V gram;  b. 06/2016 Echo: EF 40, diff HK, Gr1 DD, inf HK, no mural thrombus. Mild MR, mildly dil LA, mildly dil RV.    LBBB (left bundle branch block)    Old   Liver mass    Morbid obesity ([place])    Peripheral neuropathy    Upper GI bleed    Vertigo    Past Surgical History:  Procedure Laterality Date   BIV ICD INSERTION CRT-D N/A 04/08/2020   Procedure: BIV ICD INSERTION CRT-D;  Surgeon: Vickie Epley, MD;  Location: [place] CV LAB;  Service: Cardiovascular;  Laterality: N/A;   CARDIAC CATHETERIZATION N/A 10/03/2014   Procedure: Left Heart Cath and Coronary Angiography;  Surgeon: [name], MD;  Location: [place] CV LAB;  Service: Cardiovascular;  Laterality: N/A;   CIRCUMCISION N/A 07/22/2015   Procedure: CIRCUMCISION ADULT;  Surgeon: Hollice Espy, MD;  Location: [facility] ORS;  Service: Urology;  Laterality: N/A;   COLONOSCOPY WITH PROPOFOL N/A 01/12/2019   Procedure: COLONOSCOPY WITH PROPOFOL;  Surgeon: Lucilla Lame, MD;  Location: [facility] ENDOSCOPY;  Service: Endoscopy;  Laterality: N/A;   CORONARY STENT PLACEMENT  ESOPHAGOGASTRODUODENOSCOPY (EGD) WITH PROPOFOL N/A 09/12/2014   Procedure: ESOPHAGOGASTRODUODENOSCOPY (EGD) WITH PROPOFOL;  Surgeon: Ronald Lobo, MD;  Location: [place];  Service: Endoscopy;  Laterality: N/A;   EXTERNAL FIXATION LEG Left 04/03/2012   Procedure: EXTERNAL FIXATION LEG;  Surgeon: Wylene Simmer, MD;  Location: [place];  Service: Orthopedics;  Laterality: Left;   EXTERNAL FIXATION REMOVAL Left 04/14/2012   Procedure: REMOVAL EXTERNAL FIXATION LEG;  Surgeon: Wylene Simmer,  MD;  Location: [place];  Service: Orthopedics;  Laterality: Left;   IRRIGATION AND DEBRIDEMENT KNEE Left 04/03/2012   Procedure: IRRIGATION AND DEBRIDEMENT KNEE;  Surgeon: Wylene Simmer, MD;  Location: [place];  Service: Orthopedics;  Laterality: Left;   LEFT HEART CATHETERIZATION WITH CORONARY ANGIOGRAM N/A 04/05/2013   Procedure: LEFT HEART CATHETERIZATION WITH CORONARY ANGIOGRAM;  Surgeon: [name], MD;  Location: [place] CATH LAB;  Service: Cardiovascular;  Laterality: N/A;   MASS EXCISION N/A 07/22/2015   Procedure: EXCISION PENILE MASS;  Surgeon: Hollice Espy, MD;  Location: [facility] ORS;  Service: Urology;  Laterality: N/A;   ORIF TIBIA PLATEAU Left 04/14/2012   Procedure: OPEN REDUCTION INTERNAL FIXATION (ORIF) TIBIAL PLATEAU;  Surgeon: Wylene Simmer, MD;  Location: [place];  Service: Orthopedics;  Laterality: Left;   PERCUTANEOUS CORONARY ROTOBLATOR INTERVENTION (PCI-R) N/A 04/06/2013   Procedure: PERCUTANEOUS CORONARY ROTOBLATOR INTERVENTION (PCI-R);  Surgeon: [name], MD;  Location: [facility] CATH LAB;  Service: Cardiovascular;  Laterality: N/A;   RIGHT/LEFT HEART CATH AND CORONARY ANGIOGRAPHY N/A 08/21/2019   Procedure: RIGHT/LEFT HEART CATH AND CORONARY ANGIOGRAPHY;  Surgeon: [name], MD;  Location: [place] CV LAB;  Service: Cardiovascular;  Laterality: N/A;    Current Outpatient Medications  Medication Sig Dispense Refill   atorvastatin (LIPITOR) 40 MG tablet Take 1 tablet (40 mg total) by mouth daily. 90 tablet 0   carvedilol (COREG) 6.25 MG tablet TAKE 1 TABLET BY MOUTH TWICE DAILY WITH A MEAL 180 tablet 0   CINNAMON PO Take 1,000 mg by mouth in the morning and at bedtime.     clopidogrel (PLAVIX) 75 MG tablet TAKE ONE TABLET EVERY MORNING WITH BREAKFAST 90 tablet 3   dapagliflozin propanediol ([name]) 10 MG TABS tablet Take 1 tablet (10 mg total) by mouth daily before breakfast. 30 tablet 3   Dulaglutide ([name]) 3 ZO/1.0RU SOPN Inject 3 mg as directed once a week. 6 mL 0    ferrous sulfate 325 (65 FE) MG tablet Take 162.5 mg by mouth daily with breakfast.     furosemide (LASIX) 40 MG tablet Take 1 tablet (40 mg total) by mouth daily. 90 tablet 3   gabapentin (NEURONTIN) 300 MG capsule TAKE 1 CAPSULE BY MOUTH 3 TIMES DAILY 90 capsule 3   Multiple Vitamin (MULTIVITAMIN WITH MINERALS) TABS Take 1 tablet by mouth daily. Multivitamin for Men 50+     nitroGLYCERIN (NITROSTAT) 0.4 MG SL tablet Place 1 tablet (0.4 mg total) under the tongue every 5 (five) minutes as needed for chest pain. 25 tablet 0   pantoprazole (PROTONIX) 40 MG tablet TAKE ONE TABLET TWICE DAILY 180 tablet 1   sacubitril-valsartan (ENTRESTO) 49-51 MG Take 1 tablet by mouth 2 (two) times daily. 180 tablet 3   No current facility-administered medications for this encounter.    No Known Allergies  Social History   Socioeconomic History   Marital status: Divorced    Spouse name: Not on file   Number of children: Not on file   Years of education: Not on file   Highest  education level: Not on file  Occupational History   Occupation: heavy Company secretary    Employer: [employer]  Tobacco Use   Smoking status: Former    Packs/day: 3.00    Years: 40.00    Pack years: 120.00    Types: Cigarettes    Quit date: 04/05/1995    Years since quitting: 25.4   Smokeless tobacco: Former   Tobacco comments:    started smoking at age 70  Vaping Use   Vaping Use: Never used  Substance and Sexual Activity   Alcohol use: No    Comment: Occassional Use   Drug use: No   Sexual activity: Not on file  Other Topics Concern   Not on file  Social History Narrative   Heavy Company secretary.; Divorced; quit smoking 25 years; beer rare; by self. Still riding motor cycles.    Social Determinants of Health   Financial Resource Strain: Medium Risk   Difficulty of Paying Living Expenses: Somewhat hard  Food Insecurity: Not on file  Transportation Needs: Not on file  Physical Activity: Not on file   Stress: Not on file  Social Connections: Not on file  Intimate Partner Violence: Not on file     ROS- All systems are reviewed and negative except as per the HPI above.  Physical Exam: Vitals:   09/05/20 1447  BP: (!) 94/58  Pulse: 70  Weight: 106.4 kg  Height: 5' 10"  (1.778 m)    GEN- The patient is a well appearing obese male, alert and oriented x 3 today.   Head- normocephalic, atraumatic Eyes-  Sclera clear, conjunctiva pink Ears- hearing intact Oropharynx- clear Neck- supple  Lungs- Clear to ausculation bilaterally, normal work of breathing Heart- Regular rate and rhythm, no murmurs, rubs or gallops  GI- soft, NT, ND, + BS Extremities- no clubbing, cyanosis, or edema MS- no significant deformity or atrophy Skin- no rash or lesion Psych- euthymic mood, full affect Neuro- strength and sensation are intact  Wt Readings from Last 3 Encounters:  09/05/20 106.4 kg  08/21/20 106.1 kg  07/25/20 106.8 kg    EKG today demonstrates  V paced rhythm with underlying afib Vent. rate 70 BPM PR interval * ms QRS duration 142 ms QT/QTcB 468/505 ms  Echo 08/15/20 demonstrated  1. Left ventricular ejection fraction, by estimation, is 50 to 55%. The  left ventricle has low normal function. The left ventricle has no regional  wall motion abnormalities. Left ventricular diastolic parameters are  consistent with Grade I diastolic  dysfunction (impaired relaxation).   2. Right ventricular systolic function is normal. The right ventricular  size is normal. Tricuspid regurgitation signal is inadequate for assessing  PA pressure.   3. Left atrial size was mildly dilated.   4. The mitral valve is normal in structure. Mild mitral valve  regurgitation.   Epic records are reviewed at length today  CHA2DS2-VASc Score = 5  The patient's score is based upon: CHF History: Yes HTN History: Yes Diabetes History: Yes Stroke History: No Vascular Disease History: Yes Age Score:  1 Gender Score: 0     ASSESSMENT AND PLAN: 1. New onset atrial fibrillation  The patient's CHA2DS2-VASc score is 5, indicating a 7.2% annual risk of stroke.   General education about afib provided and questions answered. We also discussed his stroke risk and the risks and benefits of anticoagulation. Also d/w Dr Quentin Ore. Given his possible liver cancer, cirrhosis, prior GI bleed, and splenic varices his risk of having a  significant bleed is very high. After shared decision making with patient and daughter, it was agreed to defer anticoagulation for now. Patient aware of his stroke risk and in agreement with plan.  He is rate controlled and asymptomatic.   2. Secondary Hypercoagulable State (ICD10:  D68.69) The patient is at significant risk for stroke/thromboembolism based upon his CHA2DS2-VASc Score of 5.  However, the patient is not on anticoagulation due to his high bleeding risk.     3. Obesity Body mass index is 33.66 kg/m. Lifestyle modification was discussed at length including regular exercise and weight reduction.  4. CAD No anginal symptoms.  5. Chronic systolic CHF EF normalized with CRT-D, followed by device clinic and Dr Quentin Ore. No signs or symptoms of fluid overload.  6. HTN Stable, no changes today.  7. Liver mass Concerning for hepatocellular carcinoma per Epic notes. Plans per IR   Follow up with Dr Fletcher Anon as scheduled and Dr Quentin Ore per recall.    [facility] [address] [place], [place] [address] [phone] 09/05/2020 3:38 PM

## 2020-09-06 ENCOUNTER — Ambulatory Visit
Admission: RE | Admit: 2020-09-06 | Discharge: 2020-09-06 | Disposition: A | Discharge status: Home / Self Care | Payer: PPO | Source: Ambulatory Visit | Attending: General Surgery | Admitting: General Surgery

## 2020-09-06 DIAGNOSIS — C220 Liver cell carcinoma: Secondary | ICD-10-CM | POA: Diagnosis not present

## 2020-09-06 DIAGNOSIS — K769 Liver disease, unspecified: Secondary | ICD-10-CM

## 2020-09-06 HISTORY — PX: IR RADIOLOGIST EVAL & MGMT: IMG5224

## 2020-09-06 NOTE — Telephone Encounter (Signed)
Placed call to pts daughter [name]. [name] stated they are gong to hold off on going to see the GI doctor. She stated that they had the consult for [place] liver and they are sending him to [facility]. [name] states they are having a phone consult with them this afternoon. [name] also mentioned them offering him three forms of treatment. [name] stated she will call GI if they still want the appt.

## 2020-09-06 NOTE — Consult Note (Signed)
Chief Complaint: Patient was seen in consultation today for hepatocellular carcinoma  Referring Physician(s): Byerly,Faera  Patient Status: [facility] - Out-pt  History of Present Illness: Devin Hanna is a 70 y.o. male with history of NASH cirrhosis who presents as a gracious referral from Dr. Stark Klein to discuss locoregional treatment options.  He is accompanied today via telephone consultation by his daughter, Myra Gianotti.  He was diagnosed with cirrhosis in 2017 and has been undergoing surveillance imaging every 6 months since November 2020 at which time a LR-3 lesion was noted in the right lobe.  From December 2021 to July 2022, this mass (1.9 cm, now LR-5) and several other LR-3 lesions in the right lobe have progressed in conspicuity and size, suspicious for progressive disease.  He has no evidence of metastasis, lymphadenopathy, or portal venous involvement.  A-FP has gradually increased from 5.0 in 2021 to 6.9 on 08/21/20.  The patient reports decreased appetite and fatigue.  No hematemesis, hematochezia, melena, jaundice, abdominal pain, nausea, vomiting.  Past medical history is significant for coronary artery disease, congestive heart failure, obesity, hyperlipidemia, and hypertension.    He recently met with Dr. Barry Dienes and discussed surgical options, including transplant.  He and his family are wary of the prolonged recovery associated with these.  He expresses interest in pursuing less invasive treatment options.    Past Medical History:  Diagnosis Date   Acute respiratory failure with hypoxia ([place]) 06/2016   CAD (coronary artery disease)    a. Remote PCI of the RCA;  b. 03/2013 Inf STEMI/PCI: LAD 60-70d, RCA 99p ISR(rota/CBA/DES);  c. 09/2014 MV: inf and infsept infarct, EF 35-40%;  d. 09/2014 Cath: patent RCA stent, stable LAD dzs, EF 40%-->Med rx.   CHF (congestive heart failure) ([place])    Diabetes mellitus    sees Dr. Hardin Negus in [place]   GERD (gastroesophageal reflux  disease)    HFrEF (heart failure with reduced ejection fraction) ([place])    Hyperlipidemia    Hypertension    Ischemic cardiomyopathy    a. 09/2014 EF 40% by V gram;  b. 06/2016 Echo: EF 40, diff HK, Gr1 DD, inf HK, no mural thrombus. Mild MR, mildly dil LA, mildly dil RV.    LBBB (left bundle branch block)    Old   Liver mass    Morbid obesity ([place])    Peripheral neuropathy    Upper GI bleed    Vertigo     Past Surgical History:  Procedure Laterality Date   BIV ICD INSERTION CRT-D N/A 04/08/2020   Procedure: BIV ICD INSERTION CRT-D;  Surgeon: Vickie Epley, MD;  Location: [place] CV LAB;  Service: Cardiovascular;  Laterality: N/A;   CARDIAC CATHETERIZATION N/A 10/03/2014   Procedure: Left Heart Cath and Coronary Angiography;  Surgeon: [name], MD;  Location: [place] CV LAB;  Service: Cardiovascular;  Laterality: N/A;   CIRCUMCISION N/A 07/22/2015   Procedure: CIRCUMCISION ADULT;  Surgeon: Hollice Espy, MD;  Location: [facility] ORS;  Service: Urology;  Laterality: N/A;   COLONOSCOPY WITH PROPOFOL N/A 01/12/2019   Procedure: COLONOSCOPY WITH PROPOFOL;  Surgeon: Lucilla Lame, MD;  Location: [facility] ENDOSCOPY;  Service: Endoscopy;  Laterality: N/A;   CORONARY STENT PLACEMENT     ESOPHAGOGASTRODUODENOSCOPY (EGD) WITH PROPOFOL N/A 09/12/2014   Procedure: ESOPHAGOGASTRODUODENOSCOPY (EGD) WITH PROPOFOL;  Surgeon: Ronald Lobo, MD;  Location: [facility] ENDOSCOPY;  Service: Endoscopy;  Laterality: N/A;   EXTERNAL FIXATION LEG Left 04/03/2012   Procedure: EXTERNAL FIXATION LEG;  Surgeon:  Wylene Simmer, MD;  Location: [place];  Service: Orthopedics;  Laterality: Left;   EXTERNAL FIXATION REMOVAL Left 04/14/2012   Procedure: REMOVAL EXTERNAL FIXATION LEG;  Surgeon: Wylene Simmer, MD;  Location: [place];  Service: Orthopedics;  Laterality: Left;   IRRIGATION AND DEBRIDEMENT KNEE Left 04/03/2012   Procedure: IRRIGATION AND DEBRIDEMENT KNEE;  Surgeon: Wylene Simmer, MD;  Location: [place];  Service: Orthopedics;   Laterality: Left;   LEFT HEART CATHETERIZATION WITH CORONARY ANGIOGRAM N/A 04/05/2013   Procedure: LEFT HEART CATHETERIZATION WITH CORONARY ANGIOGRAM;  Surgeon: [name], MD;  Location: [place] CATH LAB;  Service: Cardiovascular;  Laterality: N/A;   MASS EXCISION N/A 07/22/2015   Procedure: EXCISION PENILE MASS;  Surgeon: Hollice Espy, MD;  Location: [facility] ORS;  Service: Urology;  Laterality: N/A;   ORIF TIBIA PLATEAU Left 04/14/2012   Procedure: OPEN REDUCTION INTERNAL FIXATION (ORIF) TIBIAL PLATEAU;  Surgeon: Wylene Simmer, MD;  Location: [place];  Service: Orthopedics;  Laterality: Left;   PERCUTANEOUS CORONARY ROTOBLATOR INTERVENTION (PCI-R) N/A 04/06/2013   Procedure: PERCUTANEOUS CORONARY ROTOBLATOR INTERVENTION (PCI-R);  Surgeon: [name], MD;  Location: [facility] CATH LAB;  Service: Cardiovascular;  Laterality: N/A;   RIGHT/LEFT HEART CATH AND CORONARY ANGIOGRAPHY N/A 08/21/2019   Procedure: RIGHT/LEFT HEART CATH AND CORONARY ANGIOGRAPHY;  Surgeon: [name], MD;  Location: [place] CV LAB;  Service: Cardiovascular;  Laterality: N/A;    Allergies: Patient has no known allergies.  Medications: Prior to Admission medications   Medication Sig Start Date End Date Taking? Authorizing Provider  atorvastatin (LIPITOR) 40 MG tablet Take 1 tablet (40 mg total) by mouth daily. 07/22/20   Theora Gianotti, NP  carvedilol (COREG) 6.25 MG tablet TAKE 1 TABLET BY MOUTH TWICE DAILY WITH A MEAL 08/16/20   [name], MD  CINNAMON PO Take 1,000 mg by mouth in the morning and at bedtime.    [provider]  clopidogrel (PLAVIX) 75 MG tablet TAKE ONE TABLET EVERY MORNING WITH BREAKFAST 01/18/20   [name], NP  dapagliflozin propanediol ([name]) 10 MG TABS tablet Take 1 tablet (10 mg total) by mouth daily before breakfast. 08/22/19   [name], MD  Dulaglutide ([name]) 3 ZO/1.0RU SOPN Inject 3 mg as directed once a week. 01/25/20   [name], MD   ferrous sulfate 325 (65 FE) MG tablet Take 162.5 mg by mouth daily with breakfast.    [provider]  furosemide (LASIX) 40 MG tablet Take 1 tablet (40 mg total) by mouth daily. 10/23/19   [name], NP  gabapentin (NEURONTIN) 300 MG capsule TAKE 1 CAPSULE BY MOUTH 3 TIMES DAILY 06/19/20   [name], MD  Multiple Vitamin (MULTIVITAMIN WITH MINERALS) TABS Take 1 tablet by mouth daily. Multivitamin for Men 50+    [provider]  nitroGLYCERIN (NITROSTAT) 0.4 MG SL tablet Place 1 tablet (0.4 mg total) under the tongue every 5 (five) minutes as needed for chest pain. 05/16/20   [name], MD  pantoprazole (PROTONIX) 40 MG tablet TAKE ONE TABLET TWICE DAILY 06/19/20   [name], NP  sacubitril-valsartan (ENTRESTO) 49-51 MG Take 1 tablet by mouth 2 (two) times daily. 07/25/20   [name], MD     Family History  Problem Relation Age of Onset   Coronary artery disease Father    Coronary artery disease Brother    Lung cancer Brother    Breast cancer Sister    Prostate cancer Brother  Stroke Sister     Social History   Socioeconomic History   Marital status: Divorced    Spouse name: Not on file   Number of children: Not on file   Years of education: Not on file   Highest education level: Not on file  Occupational History   Occupation: heavy Company secretary    Employer: [employer]  Tobacco Use   Smoking status: Former    Packs/day: 3.00    Years: 40.00    Pack years: 120.00    Types: Cigarettes    Quit date: 04/05/1995    Years since quitting: 25.4   Smokeless tobacco: Former   Tobacco comments:    started smoking at age 3  Vaping Use   Vaping Use: Never used  Substance and Sexual Activity   Alcohol use: No    Comment: Occassional Use   Drug use: No   Sexual activity: Not on file  Other Topics Concern   Not on file  Social History Narrative   Heavy Company secretary.; Divorced; 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 Still riding motor cycles.    Social Determinants of Health   Financial Resource Strain: Medium Risk   Difficulty of Paying Living Expenses: Somewhat hard  Food Insecurity: Not on file  Transportation Needs: Not on file  Physical Activity: Not on file  Stress: Not on file  Social Connections: Not on file    ECOG Status: 1 - Symptomatic but completely ambulatory  Review of Systems: A 12 point ROS discussed and pertinent positives are indicated in the HPI above.  All other systems are negative.   Vital Signs: There were no vitals taken for this visit.  No physical examination performed in lieu of virtual telephone visit.   Imaging: MR Liver (08/19/20)  1.7 cm, LR-5, multiple other scattered LR3 in right lobe. Replaced common hepatic artery from proximal SMA.   Labs:  CBC: Recent Labs    01/22/20 1109 04/04/20 0813 07/05/20 1503 08/21/20 1028  WBC 6.6 7.9 6.9 6.8  HGB 13.7 14.5 14.8 14.8  HCT 42.1 44.3 43.0 43.1  PLT 184 187 201 162    COAGS: No results for input(s): INR, APTT in the last 8760 hours.  BMP: Recent Labs    01/22/20 1109 04/04/20 0813 07/05/20 1503 08/01/20 1129 08/21/20 1028  NA 136 136 139 137 136  K 4.1 4.2 4.4 4.3 4.2  CL 99 102 100 103 101  CO2 26 25 23 25 25   GLUCOSE 150* 131* 130* 131* 131*  BUN 17 18 21 15  25*  CALCIUM 8.9 9.1 9.3 8.9 8.8*  CREATININE 1.32* 1.37* 1.40* 1.22 1.70*  GFRNONAA 58* 55*  --  >60 42*    LIVER FUNCTION TESTS: Recent Labs    01/22/20 1109 07/05/20 1503 07/29/20 0000 08/21/20 1028  BILITOT 0.8 0.6  --  1.0  AST 52* 40  --  33  ALT 45* 36  --  25  ALKPHOS 134* 165* 153* 116  PROT 7.1 6.6  --  7.1  ALBUMIN 3.5 3.8  --  3.6    Childs Pugh = A (5 points)   TUMOR MARKERS: AFP 6.9 (increased from 4.7 on 08/15/2019)  Assessment and Plan:  70 year old male with history of NASH cirrhosis Ardine Eng Pugh A) and enlarging multifocal right lobe hepatocellular carcinomas.  The largest  lesion in the right lobe (seg IV/VIII) is 1.7 cm, and there are multiple additional arterial enhancing foci throughout the right lobe  with increased conspicuity compared to prior MR.  Transplant has been discussed, but at this time he and his family do not wish to pursue.  We discussed watchful waiting in additional locoregional treatment options including percutaneous ablation of the index mass, chemoembolization, and radioembolization.  Given multifocal, unilobar disease and likely inevitable progression of the currently LR-3 lesions in the right lobe, he would likely most benefit from radioembolization.    We discussed hepatic angiogram with Tc-MAA administration and evaluation for pulmonary shunting with possible embolization to protect visceral branches prior to Y-90 administration at a later date.  If the index mass is the only salient focus of [place] on angiogram, we may shift treatment toward ablation.  Plan for hepatic angiogram with Tc-MAA administration at [facility] with moderate sedation, right CFA approach, plan for same day discharge home.  No need to hold Plavix.  No antibiotics.    Thank you for this interesting consult.  I greatly enjoyed meeting Milledge Gerding Olivarez and look forward to participating in their care.  A copy of this report was sent to the requesting provider on this date.  Electronically Signed: Suzette Battiest, MD 09/06/2020, 3:52 PM   I spent a total of  60 Minutes  in face to face in clinical consultation, greater than 50% of which was counseling/coordinating care for hepatocellular carcinoma.

## 2020-09-08 NOTE — Telephone Encounter (Signed)
Noted. Liver lesion is being followed by oncology.

## 2020-09-10 ENCOUNTER — Encounter: Payer: Self-pay | Admitting: *Deleted

## 2020-09-12 ENCOUNTER — Telehealth: Payer: Self-pay | Admitting: Cardiovascular Disease

## 2020-09-12 MED ORDER — FUROSEMIDE 40 MG PO TABS: 20.0000 mg | ORAL_TABLET | Freq: Every day | ORAL | 3 refills | Status: DC

## 2020-09-12 NOTE — Telephone Encounter (Signed)
Decrease Lasix to 20 mg once daily and decrease Entresto to 24/26 mg twice daily.  Continue other medications.

## 2020-09-12 NOTE — Telephone Encounter (Signed)
Can you give me more details about this?  What is going on with the blood pressure?  Is he having any symptoms?

## 2020-09-12 NOTE — Telephone Encounter (Signed)
Returned call to [employer]). She states that pt c/o fatigue and DOE for "quite a awhile". She states that pt had appointment 7-28 with Dr Marlene Lard. Pt is taking all medications as ordered. Pt has pacemaker. Pt is having some beads for radiation in his liver soon. His BP has been 9-110/50-60's does not know the HR. She is most concerned with the low BP since he is going to have radiation soon. Is there anything we can do about this? Please advise.

## 2020-09-12 NOTE — Telephone Encounter (Signed)
Notified [place] Jansky Tuggle(daughter)  of Dr Tyrell Antonio message. Verbalized understanding. She will cut lasix and Entresto in 1/2 for pt until otherwise notified. Daughter will call to have Entresto refilled 2 weeks before pt is out of medication.

## 2020-09-12 NOTE — Telephone Encounter (Signed)
Pts daughter called in wanting to know what steps can be taken to get her dads bp up... please advise.

## 2020-09-23 ENCOUNTER — Other Ambulatory Visit: Payer: Self-pay | Admitting: Nurse Practitioner

## 2020-09-23 ENCOUNTER — Other Ambulatory Visit: Payer: Self-pay | Admitting: Family

## 2020-09-23 ENCOUNTER — Other Ambulatory Visit: Payer: Self-pay | Admitting: Family Medicine

## 2020-09-23 DIAGNOSIS — N1832 Chronic kidney disease, stage 3b: Secondary | ICD-10-CM | POA: Diagnosis not present

## 2020-09-23 DIAGNOSIS — N189 Chronic kidney disease, unspecified: Secondary | ICD-10-CM | POA: Diagnosis not present

## 2020-10-01 ENCOUNTER — Other Ambulatory Visit (HOSPITAL_COMMUNITY): Payer: Self-pay | Admitting: Interventional Radiology

## 2020-10-01 DIAGNOSIS — C220 Liver cell carcinoma: Secondary | ICD-10-CM

## 2020-10-03 DIAGNOSIS — I5022 Chronic systolic (congestive) heart failure: Secondary | ICD-10-CM | POA: Diagnosis not present

## 2020-10-03 DIAGNOSIS — D631 Anemia in chronic kidney disease: Secondary | ICD-10-CM | POA: Diagnosis not present

## 2020-10-03 DIAGNOSIS — I129 Hypertensive chronic kidney disease with stage 1 through stage 4 chronic kidney disease, or unspecified chronic kidney disease: Secondary | ICD-10-CM | POA: Diagnosis not present

## 2020-10-03 DIAGNOSIS — N1832 Chronic kidney disease, stage 3b: Secondary | ICD-10-CM | POA: Diagnosis not present

## 2020-10-03 DIAGNOSIS — I2510 Atherosclerotic heart disease of native coronary artery without angina pectoris: Secondary | ICD-10-CM | POA: Diagnosis not present

## 2020-10-03 DIAGNOSIS — E1122 Type 2 diabetes mellitus with diabetic chronic kidney disease: Secondary | ICD-10-CM | POA: Diagnosis not present

## 2020-10-04 ENCOUNTER — Other Ambulatory Visit: Payer: Self-pay

## 2020-10-04 ENCOUNTER — Ambulatory Visit (INDEPENDENT_AMBULATORY_CARE_PROVIDER_SITE_OTHER): Payer: PPO | Admitting: Family Medicine

## 2020-10-04 ENCOUNTER — Encounter: Payer: Self-pay | Admitting: Family Medicine

## 2020-10-04 DIAGNOSIS — E1142 Type 2 diabetes mellitus with diabetic polyneuropathy: Secondary | ICD-10-CM | POA: Diagnosis not present

## 2020-10-04 DIAGNOSIS — I10 Essential (primary) hypertension: Secondary | ICD-10-CM | POA: Diagnosis not present

## 2020-10-04 DIAGNOSIS — G629 Polyneuropathy, unspecified: Secondary | ICD-10-CM

## 2020-10-04 LAB — BASIC METABOLIC PANEL
BUN: 17 mg/dL (ref 6–23)
CO2: 26 mEq/L (ref 19–32)
Calcium: 9.2 mg/dL (ref 8.4–10.5)
Chloride: 101 mEq/L (ref 96–112)
Creatinine, Ser: 1.47 mg/dL (ref 0.40–1.50)
GFR: 47.35 mL/min — ABNORMAL LOW (ref >60.00)
Glucose, Bld: 114 mg/dL — ABNORMAL HIGH (ref 70–99)
Potassium: 4.2 mEq/L (ref 3.5–5.1)
Sodium: 138 mEq/L (ref 135–145)

## 2020-10-04 LAB — HEMOGLOBIN A1C: Hgb A1c MFr Bld: 6.4 % (ref 4.6–6.5)

## 2020-10-04 MED ORDER — GABAPENTIN 300 MG PO CAPS
ORAL_CAPSULE | ORAL | 3 refills | Status: AC
Start: 2020-10-04 — End: ?

## 2020-10-04 NOTE — Assessment & Plan Note (Signed)
Adequate control.  He will monitor on his current doses of Entresto and Lasix and carvedilol.

## 2020-10-04 NOTE — Progress Notes (Signed)
[name], MD Phone: [phone]  Devin Hanna is a 70 y.o. male who presents today for f/u.  Diabetes: Typically 120-140.  Taking [name] and [place].  No polyuria or polydipsia.  No hypoglycemia.  He is due to see ophthalmology.  Neuropathy: Continues on gabapentin 300 mg 3 times a day.  He notes no significant difference with this.  No drowsiness with this.  Notes he has burning and tingling in his feet.  Hypertension: Patient notes his blood pressure was getting low into the 93-26 systolic range.  His cardiologist cut his Lasix and Entresto doses in half.  He notes no lightheadedness.  No chest pain.  He notes chronic stable dyspnea.  Chronic stable lower extremity edema as well.  Social History   Tobacco Use  Smoking Status Former   Packs/day: 3.00   Years: 40.00   Pack years: 120.00   Types: Cigarettes   Quit date: 04/05/1995   Years since quitting: 25.5  Smokeless Tobacco Former  Tobacco Comments   started smoking at age 67    Current Outpatient Medications on File Prior to Visit  Medication Sig Dispense Refill   atorvastatin (LIPITOR) 40 MG tablet TAKE ONE TABLET BY MOUTH EVERY DAY 90 tablet 0   carvedilol (COREG) 6.25 MG tablet TAKE 1 TABLET BY MOUTH TWICE DAILY WITH A MEAL 180 tablet 0   CINNAMON PO Take 1,000 mg by mouth in the morning and at bedtime.     clopidogrel (PLAVIX) 75 MG tablet TAKE ONE TABLET EVERY MORNING WITH BREAKFAST 90 tablet 3   dapagliflozin propanediol ([name]) 10 MG TABS tablet Take 1 tablet (10 mg total) by mouth daily before breakfast. 30 tablet 3   Dulaglutide ([name]) 3 ZT/2.4PY SOPN Inject 3 mg as directed once a week. 6 mL 0   ferrous sulfate 325 (65 FE) MG tablet Take 162.5 mg by mouth daily with breakfast.     furosemide (LASIX) 40 MG tablet Take 0.5 tablets (20 mg total) by mouth daily. 90 tablet 3   Multiple Vitamin (MULTIVITAMIN WITH MINERALS) TABS Take 1 tablet by mouth daily. Multivitamin for Men 50+     nitroGLYCERIN  (NITROSTAT) 0.4 MG SL tablet Place 1 tablet (0.4 mg total) under the tongue every 5 (five) minutes as needed for chest pain. 25 tablet 0   pantoprazole (PROTONIX) 40 MG tablet TAKE ONE TABLET TWICE DAILY 180 tablet 1   sacubitril-valsartan (ENTRESTO) 49-51 MG Take 1 tablet by mouth 2 (two) times daily. (Patient taking differently: Take 1 tablet by mouth 2 (two) times daily.) 180 tablet 3   No current facility-administered medications on file prior to visit.     ROS see history of present illness  Objective  Physical Exam Vitals:   10/04/20 0841  BP: 130/70  Pulse: 70  Temp: 97.6 F (36.4 C)  SpO2: 99%    BP Readings from Last 3 Encounters:  10/04/20 130/70  09/05/20 (!) 94/58  08/21/20 106/68   Wt Readings from Last 3 Encounters:  10/04/20 235 lb 9.6 oz (106.9 kg)  09/05/20 234 lb 9.6 oz (106.4 kg)  08/21/20 234 lb (106.1 kg)    Physical Exam Constitutional:      General: He is not in acute distress.    Appearance: He is not diaphoretic.  Cardiovascular:     Rate and Rhythm: Normal rate and regular rhythm.     Heart sounds: Normal heart sounds.  Pulmonary:     Effort: Pulmonary effort is normal.     Breath  sounds: Normal breath sounds.  Skin:    General: Skin is warm and dry.  Neurological:     Mental Status: He is alert.   Diabetic Foot Exam - Simple   Simple Foot Form Diabetic Foot exam was performed with the following findings: Yes 10/04/2020  8:53 AM  Visual Inspection No deformities, no ulcerations, no other skin breakdown bilaterally: Yes Sensation Testing See comments: Yes Pulse Check Posterior Tibialis and Dorsalis pulse intact bilaterally: Yes Comments Decreased monofilament sensation on the dorsum of his feet bilaterally, otherwise intact monofilament and light touch sensation      Assessment/Plan: Please see individual problem list.  Problem List Items Addressed This Visit     DM type 2 (diabetes mellitus, type 2) (HCC)    Check A1c.  He  will continue [name] 10 mg once daily and [name] 3 mg weekly.      Relevant Orders   HgB A1c   HTN (hypertension)    Adequate control.  He will monitor on his current doses of Entresto and Lasix and carvedilol.      Relevant Orders   Basic Metabolic Panel (BMET)   Neuropathy    Continued issues with neuropathy.  We will have him increase his gabapentin to 600 mg at night.  He will continue gabapentin 300 mg in the morning and middle of the day.      Relevant Medications   gabapentin (NEURONTIN) 300 MG capsule     Health Maintenance: Discussed getting Shingrix vaccine at the pharmacy.  I encouraged him to see his eye doctor for his yearly exam.  Return in about 3 months (around 01/04/2021).  This visit occurred during the SARS-CoV-2 public health emergency.  Safety protocols were in place, including screening questions prior to the visit, additional usage of staff PPE, and extensive cleaning of exam room while observing appropriate contact time as indicated for disinfecting solutions.    [name], MD [place]

## 2020-10-04 NOTE — Patient Instructions (Signed)
nice to see you. We are going to increase your gabapentin to 600 mg at night.  He will continue 300 mg in the morning and middle of the day.  If this change makes you drowsy please let us know.  If it does not help with your neuropathy please let us know. Please consider getting the Shingrix vaccine (shingles vaccine) at the pharmacy.

## 2020-10-04 NOTE — Assessment & Plan Note (Signed)
Check A1c.  He will continue [name] 10 mg once daily and [name] 3 mg weekly.

## 2020-10-04 NOTE — Assessment & Plan Note (Signed)
Continued issues with neuropathy.  We will have him increase his gabapentin to 600 mg at night.  He will continue gabapentin 300 mg in the morning and middle of the day.

## 2020-10-08 ENCOUNTER — Ambulatory Visit (INDEPENDENT_AMBULATORY_CARE_PROVIDER_SITE_OTHER): Payer: PPO

## 2020-10-08 DIAGNOSIS — I4819 Other persistent atrial fibrillation: Secondary | ICD-10-CM | POA: Diagnosis not present

## 2020-10-08 LAB — CUP PACEART REMOTE DEVICE CHECK
Battery Remaining Longevity: 82 mo
Battery Remaining Percentage: 91 %
Battery Voltage: 3.01 V
Brady Statistic AP VP Percent: 8.3 %
Brady Statistic AP VS Percent: 1 %
Brady Statistic AS VP Percent: 90 %
Brady Statistic AS VS Percent: 1.3 %
Brady Statistic RA Percent Paced: 5.3 %
Date Time Interrogation Session: 20220830020039
HighPow Impedance: 77 Ohm
Implantable Lead Implant Date: 20220228
Implantable Lead Implant Date: 20220228
Implantable Lead Implant Date: 20220228
Implantable Lead Location: 753858
Implantable Lead Location: 753859
Implantable Lead Location: 753860
Implantable Pulse Generator Implant Date: 20220228
Lead Channel Impedance Value: 1125 Ohm
Lead Channel Impedance Value: 430 Ohm
Lead Channel Impedance Value: 540 Ohm
Lead Channel Pacing Threshold Amplitude: 0.75 V
Lead Channel Pacing Threshold Amplitude: 1.5 V
Lead Channel Pacing Threshold Amplitude: 3.125 V
Lead Channel Pacing Threshold Pulse Width: 0.5 ms
Lead Channel Pacing Threshold Pulse Width: 0.5 ms
Lead Channel Pacing Threshold Pulse Width: 0.5 ms
Lead Channel Sensing Intrinsic Amplitude: 12 mV
Lead Channel Sensing Intrinsic Amplitude: 2.9 mV
Lead Channel Setting Pacing Amplitude: 1.75 V
Lead Channel Setting Pacing Amplitude: 2.5 V
Lead Channel Setting Pacing Amplitude: 3.625
Lead Channel Setting Pacing Pulse Width: 0.5 ms
Lead Channel Setting Pacing Pulse Width: 0.5 ms
Lead Channel Setting Sensing Sensitivity: 0.5 mV
Pulse Gen Serial Number: 810018869

## 2020-10-15 ENCOUNTER — Telehealth: Payer: Self-pay | Admitting: Family Medicine

## 2020-10-15 ENCOUNTER — Other Ambulatory Visit: Payer: Self-pay | Admitting: Radiology

## 2020-10-15 NOTE — Telephone Encounter (Signed)
Pt daughter requested a refill of Iran for [place]. Pt last seen on 10/04/20. Called and spoke with [name] concerning the Wilder Glade as last refill was recorded for 08/22/2019. She states that [place] has received medication since 2021 and that the medication has come from [facility]. She states that [place] has picked up a prescription of [name] within the last 3 months.Ok to refill?

## 2020-10-15 NOTE — H&P (Signed)
Chief Complaint: Patient was seen in consultation today for hepatocellular carcinoma treatment  Referring Physician(s): Dr. Stark Klein   Supervising Physician: Ruthann Cancer  Patient Status: [facility] - Out-pt  History of Present Illness: Devin Hanna is a 70 y.o. male with a medical history significant for CHF, CAD, ischemic cardiomyopathy with ICD, DM, cirrhosis and a liver mass initially identified on a renal ultrasound 11/29/18. An MR liver 12/16/2018 confirmed the presence of a 1.8 cm hypervascular lesion in the anterior right liver with features consistent with LI-RADS 3 category lesion. He has been followed since then with surveillance imaging, the most recent being 08/19/20.  MR Liver 08/19/20 Hepatobiliary: Coarse, nodular cirrhotic contour of the liver. Significant interval enlargement of an arterially hyperenhancing lesion of the anterior liver dome, hepatic segment VII, measuring 1.9 x 1.5 cm, previously no greater than 1.0 cm (series 15, image 28). No evidence of washout or capsular enhancement. Multiple additional subcentimeter hyperenhancing lesions of the liver are unchanged. Small gallstones near the gallbladder neck. No gallbladder wall thickening. No biliary ductal dilatation. IMPRESSION: 1. Significant interval enlargement of an arterially hyperenhancing lesion of the anterior liver dome, hepatic segment VII, measuring 1.9 x 1.5 cm, previously no greater than 1.0 cm. This constitutes threshold growth, however there is no evidence of washout or capsular enhancement. LI-RADS category 5, consistent with hepatocellular carcinoma. 2. Multiple additional subcentimeter hyperenhancing lesions of the liver are unchanged, which likely reflect regenerative or dysplastic nodules in the setting of cirrhosis and remain intermediate in suspicion for hepatocellular carcinoma, LI-RADS category 3. 3. Cirrhosis. 4. Cholelithiasis.  Surgical options including transplant were  discussed with Dr. Barry Dienes but the patient and his family wanted to explore less invasive treatment choices. A referral was made to Interventional Radiology and Devin Hanna met with Dr. Serafina Royals via tele-health visit on 09/06/20. Multiple choices were discussed including watchful waiting and locoregional treatment options including percutaneous ablation, chemoembolization and radioembolization. Given the multifocal, unilobar disease and likely inevitable progression, radioembolization was deemed the most appropriate treatment choice and the patient was in agreement to proceed.  He presents today for a hepatic angiogram with Tc-MAA administration to evaluate for pulmonary shunting with possible embolization to protect visceral branches prior to Y-90 administration at a later date.   Past Medical History:  Diagnosis Date   Acute respiratory failure with hypoxia ([place]) 06/2016   CAD (coronary artery disease)    a. Remote PCI of the RCA;  b. 03/2013 Inf STEMI/PCI: LAD 60-70d, RCA 99p ISR(rota/CBA/DES);  c. 09/2014 MV: inf and infsept infarct, EF 35-40%;  d. 09/2014 Cath: patent RCA stent, stable LAD dzs, EF 40%-->Med rx.   CHF (congestive heart failure) ([place])    Diabetes mellitus    sees Dr. Hardin Negus in [place]   GERD (gastroesophageal reflux disease)    HFrEF (heart failure with reduced ejection fraction) ([place])    Hyperlipidemia    Hypertension    Ischemic cardiomyopathy    a. 09/2014 EF 40% by V gram;  b. 06/2016 Echo: EF 40, diff HK, Gr1 DD, inf HK, no mural thrombus. Mild MR, mildly dil LA, mildly dil RV.    LBBB (left bundle branch block)    Old   Liver mass    Morbid obesity (HCC)    Peripheral neuropathy    Upper GI bleed    Vertigo     Past Surgical History:  Procedure Laterality Date   BIV ICD INSERTION CRT-D N/A 04/08/2020   Procedure: BIV ICD INSERTION CRT-D;  Surgeon:  Vickie Epley, MD;  Location: [place] CV LAB;  Service: Cardiovascular;  Laterality: N/A;   CARDIAC  CATHETERIZATION N/A 10/03/2014   Procedure: Left Heart Cath and Coronary Angiography;  Surgeon: [name], MD;  Location: [place] CV LAB;  Service: Cardiovascular;  Laterality: N/A;   CIRCUMCISION N/A 07/22/2015   Procedure: CIRCUMCISION ADULT;  Surgeon: Hollice Espy, MD;  Location: [facility] ORS;  Service: Urology;  Laterality: N/A;   COLONOSCOPY WITH PROPOFOL N/A 01/12/2019   Procedure: COLONOSCOPY WITH PROPOFOL;  Surgeon: Lucilla Lame, MD;  Location: [facility] ENDOSCOPY;  Service: Endoscopy;  Laterality: N/A;   CORONARY STENT PLACEMENT     ESOPHAGOGASTRODUODENOSCOPY (EGD) WITH PROPOFOL N/A 09/12/2014   Procedure: ESOPHAGOGASTRODUODENOSCOPY (EGD) WITH PROPOFOL;  Surgeon: Ronald Lobo, MD;  Location: [facility] ENDOSCOPY;  Service: Endoscopy;  Laterality: N/A;   EXTERNAL FIXATION LEG Left 04/03/2012   Procedure: EXTERNAL FIXATION LEG;  Surgeon: Wylene Simmer, MD;  Location: [place];  Service: Orthopedics;  Laterality: Left;   EXTERNAL FIXATION REMOVAL Left 04/14/2012   Procedure: REMOVAL EXTERNAL FIXATION LEG;  Surgeon: Wylene Simmer, MD;  Location: [place];  Service: Orthopedics;  Laterality: Left;   IR RADIOLOGIST EVAL & MGMT  09/06/2020   IRRIGATION AND DEBRIDEMENT KNEE Left 04/03/2012   Procedure: IRRIGATION AND DEBRIDEMENT KNEE;  Surgeon: Wylene Simmer, MD;  Location: [place];  Service: Orthopedics;  Laterality: Left;   LEFT HEART CATHETERIZATION WITH CORONARY ANGIOGRAM N/A 04/05/2013   Procedure: LEFT HEART CATHETERIZATION WITH CORONARY ANGIOGRAM;  Surgeon: [name], MD;  Location: [place] CATH LAB;  Service: Cardiovascular;  Laterality: N/A;   MASS EXCISION N/A 07/22/2015   Procedure: EXCISION PENILE MASS;  Surgeon: Hollice Espy, MD;  Location: [facility] ORS;  Service: Urology;  Laterality: N/A;   ORIF TIBIA PLATEAU Left 04/14/2012   Procedure: OPEN REDUCTION INTERNAL FIXATION (ORIF) TIBIAL PLATEAU;  Surgeon: Wylene Simmer, MD;  Location: [place];  Service: Orthopedics;  Laterality: Left;   PERCUTANEOUS CORONARY  ROTOBLATOR INTERVENTION (PCI-R) N/A 04/06/2013   Procedure: PERCUTANEOUS CORONARY ROTOBLATOR INTERVENTION (PCI-R);  Surgeon: [name], MD;  Location: [facility] C[facility] LAB;  Service: Cardiovascular;  Laterality: N/A;   RIGHT/LEFT HEART CATH AND CORONARY ANGIOGRAPHY N/A 08/21/2019   Procedure: RIGHT/LEFT HEART CATH AND CORONARY ANGIOGRAPHY;  Surgeon: [name], MD;  Location: [place] CV LAB;  Service: Cardiovascular;  Laterality: N/A;    Allergies: Patient has no known allergies.  Medications: Prior to Admission medications   Medication Sig Start Date End Date Taking? Authorizing Provider  atorvastatin (LIPITOR) 40 MG tablet TAKE ONE TABLET BY MOUTH EVERY DAY 09/24/20   Theora Gianotti, NP  carvedilol (COREG) 6.25 MG tablet TAKE 1 TABLET BY MOUTH TWICE DAILY WITH A MEAL 08/16/20   [name], MD  CINNAMON PO Take 1,000 mg by mouth in the morning and at bedtime.    [provider]  clopidogrel (PLAVIX) 75 MG tablet TAKE ONE TABLET EVERY MORNING WITH BREAKFAST 01/18/20   [name], NP  dapagliflozin propanediol ([name]) 10 MG TABS tablet Take 1 tablet (10 mg total) by mouth daily before breakfast. 08/22/19   [name], MD  Dulaglutide ([name]) 3 WC/5.8NI SOPN Inject 3 mg as directed once a week. 01/25/20   [name], MD  ferrous sulfate 325 (65 FE) MG tablet Take 162.5 mg by mouth daily with breakfast.    [provider]  furosemide (LASIX) 40 MG tablet Take 0.5 tablets (20 mg total) by mouth daily. 09/12/20   Fletcher Anon,  Mertie Clause, MD  gabapentin (NEURONTIN) 300 MG capsule Take 1 capsule (300 mg total) by mouth every morning AND 1 capsule (300 mg total) daily with lunch AND 2 capsules (600 mg total) at bedtime. 10/04/20   [name], MD  Multiple Vitamin (MULTIVITAMIN WITH MINERALS) TABS Take 1 tablet by mouth daily. Multivitamin for Men 50+    [provider]  nitroGLYCERIN (NITROSTAT) 0.4 MG SL tablet Place 1 tablet  (0.4 mg total) under the tongue every 5 (five) minutes as needed for chest pain. 05/16/20   [name], MD  pantoprazole (PROTONIX) 40 MG tablet TAKE ONE TABLET TWICE DAILY 06/19/20   [name], NP  sacubitril-valsartan (ENTRESTO) 49-51 MG Take 1 tablet by mouth 2 (two) times daily. Patient taking differently: Take 1 tablet by mouth 2 (two) times daily. 07/25/20   [name], MD     Family History  Problem Relation Age of Onset   Coronary artery disease Father    Coronary artery disease Brother    Lung cancer Brother    Breast cancer Sister    Prostate cancer Brother    Stroke Sister     Social History   Socioeconomic History   Marital status: Divorced    Spouse name: Not on file   Number of children: Not on file   Years of education: Not on file   Highest education level: Not on file  Occupational History   Occupation: heavy Company secretary    Employer: [employer]  Tobacco Use   Smoking status: Former    Packs/day: 3.00    Years: 40.00    Pack years: 120.00    Types: Cigarettes    Quit date: 04/05/1995    Years since quitting: 25.5   Smokeless tobacco: Former   Tobacco comments:    started smoking at age 89  Vaping Use   Vaping Use: Never used  Substance and Sexual Activity   Alcohol use: No    Comment: Occassional Use   Drug use: No   Sexual activity: Not on file  Other Topics Concern   Not on file  Social History Narrative   Heavy Company secretary.; Divorced; quit smoking 25 years; beer rare; by self. Still riding motor cycles.    Social Determinants of Health   Financial Resource Strain: Medium Risk   Difficulty of Paying Living Expenses: Somewhat hard  Food Insecurity: Not on file  Transportation Needs: Not on file  Physical Activity: Not on file  Stress: Not on file  Social Connections: Not on file    Review of Systems: A 12 point ROS discussed and pertinent positives are indicated in the HPI above.  All other systems are  negative.  Review of Systems  Constitutional:  Negative for appetite change and fatigue.  Respiratory:  Negative for cough and shortness of breath.   Gastrointestinal:  Negative for abdominal pain, diarrhea, nausea and vomiting.  Neurological:  Negative for dizziness and headaches.   Vital Signs: BP (!) 150/78   Pulse 70   Temp (!) 97.5 F (36.4 C) (Oral)   Resp 18   SpO2 100%   Physical Exam Constitutional:      General: He is not in acute distress.    Appearance: He is not ill-appearing.  HENT:     Mouth/Throat:     Mouth: Mucous membranes are moist.     Pharynx: Oropharynx is clear.     Comments: Dentures upper and lower Cardiovascular:  Rate and Rhythm: Normal rate and regular rhythm.     Pulses: Normal pulses.     Heart sounds: Normal heart sounds.  Pulmonary:     Effort: Pulmonary effort is normal.     Breath sounds: Normal breath sounds.  Abdominal:     General: Bowel sounds are normal.     Palpations: Abdomen is soft.     Tenderness: There is no abdominal tenderness.  Musculoskeletal:     Right lower leg: Edema present.     Left lower leg: Edema present.  Skin:    General: Skin is warm and dry.  Neurological:     Mental Status: He is alert and oriented to person, place, and time.    Imaging: CUP PACEART REMOTE DEVICE CHECK  Result Date: 10/08/2020 Scheduled remote reviewed. Normal device function.  Known persistent AF, controlled rates since 09/02/20. Not on anticoagulation due to high risk of bleeding.  Next remote 91 days- J[facility], RN/CVRS   Labs:  CBC: Recent Labs    01/22/20 1109 04/04/20 0813 07/05/20 1503 08/21/20 1028  WBC 6.6 7.9 6.9 6.8  HGB 13.7 14.5 14.8 14.8  HCT 42.1 44.3 43.0 43.1  PLT 184 187 201 162    COAGS: No results for input(s): INR, APTT in the last 8760 hours.  BMP: Recent Labs    01/22/20 1109 04/04/20 0813 07/05/20 1503 08/01/20 1129 08/21/20 1028 10/04/20 0901  NA 136 136 139 137 136 138  K 4.1 4.2 4.4 4.3  4.2 4.2  CL 99 102 100 103 101 101  CO2 _0 GLUCOSE 150* 131* 130* 131* 131* 114*  BUN _1 25* 17  CALCIUM 8.9 9.1 9.3 8.9 8.8* 9.2  CREATININE 1.32* 1.37* 1.40* 1.22 1.70* 1.47  GFRNONAA 58* 55*  --  >60 42*  --     LIVER FUNCTION TESTS: Recent Labs    01/22/20 1109 07/05/20 1503 07/29/20 0000 08/21/20 1028  BILITOT 0.8 0.6  --  1.0  AST 52* 40  --  33  ALT 45* 36  --  25  ALKPHOS 134* 165* 153* 116  PROT 7.1 6.6  --  7.1  ALBUMIN 3.5 3.8  --  3.6    TUMOR MARKERS: No results for input(s): AFPTM, CEA, CA199, CHROMGRNA in the last 8760 hours.  Assessment and Plan:  Hepatocellular carcinoma: Devin Hanna, 69 year old male, presents today to the [facility] department for an image-guided hepatic angiogram with Tc-MAA administration.   Risks and benefits discussed with the patient including, but not limited to bleeding, infection, vascular injury, post procedural pain, nausea, vomiting and fatigue, contrast induced renal failure, liver failure, radiation injury to the bowel, radiation induced cholecystitis, neutropenia and possible need for additional procedures.  All of the patient's questions were answered, patient is agreeable to proceed. He has been NPO. He does take Plavix daily. Labs are pending but will be reviewed prior to the start of the procedure.   Consent signed and in chart.   Thank you for this interesting consult.  I greatly enjoyed meeting Devin Hanna and look forward to participating in their care.  A copy of this report was sent to the requesting provider on this date.  Electronically Signed: Soyla Dryer, AGACNP-BC [phone] 10/16/2020, 7:59 AM   I spent a total of  30 Minutes   in face to face in clinical consultation, greater than 50% of which was counseling/coordinating care for pre-Y90 arterial mapping

## 2020-10-15 NOTE — Telephone Encounter (Signed)
Patients daughter is calling in to request a refill on his dapagliflozin propanediol ([name]) 10 MG TABS tablet to be sent to [facility].He is currently only taking half of a pill because he will be running out soon.Please call his daughter [name] at [phone] with an update.

## 2020-10-16 ENCOUNTER — Other Ambulatory Visit (HOSPITAL_COMMUNITY): Payer: Self-pay | Admitting: Interventional Radiology

## 2020-10-16 ENCOUNTER — Encounter (HOSPITAL_COMMUNITY): Payer: Self-pay

## 2020-10-16 ENCOUNTER — Encounter (HOSPITAL_COMMUNITY)
Admission: RE | Admit: 2020-10-16 | Discharge: 2020-10-16 | Disposition: A | Discharge status: Home / Self Care | Payer: PPO | Source: Ambulatory Visit | Attending: Interventional Radiology | Admitting: Interventional Radiology

## 2020-10-16 ENCOUNTER — Ambulatory Visit (HOSPITAL_COMMUNITY)
Admission: RE | Admit: 2020-10-16 | Discharge: 2020-10-16 | Disposition: A | Discharge status: Home / Self Care | Payer: PPO | Source: Ambulatory Visit

## 2020-10-16 ENCOUNTER — Other Ambulatory Visit: Payer: Self-pay

## 2020-10-16 ENCOUNTER — Ambulatory Visit: Payer: PPO | Admitting: Pharmacist

## 2020-10-16 ENCOUNTER — Ambulatory Visit (HOSPITAL_COMMUNITY)
Admission: RE | Admit: 2020-10-16 | Discharge: 2020-10-16 | Disposition: A | Discharge status: Home / Self Care | Payer: PPO | Source: Ambulatory Visit | Attending: Interventional Radiology | Admitting: Interventional Radiology

## 2020-10-16 DIAGNOSIS — I509 Heart failure, unspecified: Secondary | ICD-10-CM | POA: Insufficient documentation

## 2020-10-16 DIAGNOSIS — I255 Ischemic cardiomyopathy: Secondary | ICD-10-CM | POA: Insufficient documentation

## 2020-10-16 DIAGNOSIS — K7460 Unspecified cirrhosis of liver: Secondary | ICD-10-CM | POA: Diagnosis not present

## 2020-10-16 DIAGNOSIS — Z79899 Other long term (current) drug therapy: Secondary | ICD-10-CM | POA: Diagnosis not present

## 2020-10-16 DIAGNOSIS — E119 Type 2 diabetes mellitus without complications: Secondary | ICD-10-CM | POA: Diagnosis not present

## 2020-10-16 DIAGNOSIS — I110 Hypertensive heart disease with heart failure: Secondary | ICD-10-CM | POA: Insufficient documentation

## 2020-10-16 DIAGNOSIS — C220 Liver cell carcinoma: Secondary | ICD-10-CM | POA: Insufficient documentation

## 2020-10-16 DIAGNOSIS — Z95810 Presence of automatic (implantable) cardiac defibrillator: Secondary | ICD-10-CM | POA: Insufficient documentation

## 2020-10-16 DIAGNOSIS — K7581 Nonalcoholic steatohepatitis (NASH): Secondary | ICD-10-CM | POA: Insufficient documentation

## 2020-10-16 DIAGNOSIS — Z7902 Long term (current) use of antithrombotics/antiplatelets: Secondary | ICD-10-CM | POA: Insufficient documentation

## 2020-10-16 DIAGNOSIS — E1142 Type 2 diabetes mellitus with diabetic polyneuropathy: Secondary | ICD-10-CM

## 2020-10-16 DIAGNOSIS — I25118 Atherosclerotic heart disease of native coronary artery with other forms of angina pectoris: Secondary | ICD-10-CM

## 2020-10-16 DIAGNOSIS — I5022 Chronic systolic (congestive) heart failure: Secondary | ICD-10-CM

## 2020-10-16 DIAGNOSIS — E785 Hyperlipidemia, unspecified: Secondary | ICD-10-CM

## 2020-10-16 DIAGNOSIS — I2510 Atherosclerotic heart disease of native coronary artery without angina pectoris: Secondary | ICD-10-CM | POA: Diagnosis not present

## 2020-10-16 DIAGNOSIS — Z7984 Long term (current) use of oral hypoglycemic drugs: Secondary | ICD-10-CM | POA: Insufficient documentation

## 2020-10-16 DIAGNOSIS — Z87891 Personal history of nicotine dependence: Secondary | ICD-10-CM | POA: Insufficient documentation

## 2020-10-16 HISTORY — PX: IR US GUIDE VASC ACCESS RIGHT: IMG2390

## 2020-10-16 HISTORY — PX: IR 3D INDEPENDENT WKST: IMG2385

## 2020-10-16 HISTORY — PX: IR ANGIOGRAM VISCERAL SELECTIVE: IMG657

## 2020-10-16 HISTORY — PX: IR EMBO ARTERIAL NOT HEMORR HEMANG INC GUIDE ROADMAPPING: IMG5448

## 2020-10-16 HISTORY — PX: IR ANGIOGRAM SELECTIVE EACH ADDITIONAL VESSEL: IMG667

## 2020-10-16 LAB — CBC
HCT: 44.6 % (ref 39.0–52.0)
Hemoglobin: 15 g/dL (ref 13.0–17.0)
MCH: 32.7 pg (ref 26.0–34.0)
MCHC: 33.6 g/dL (ref 30.0–36.0)
MCV: 97.2 fL (ref 80.0–100.0)
Platelets: 154 10*3/uL (ref 150–400)
RBC: 4.59 MIL/uL (ref 4.22–5.81)
RDW: 14.2 % (ref 11.5–15.5)
WBC: 5.8 10*3/uL (ref 4.0–10.5)
nRBC: 0 % (ref 0.0–0.2)

## 2020-10-16 LAB — COMPREHENSIVE METABOLIC PANEL
ALT: 33 U/L (ref 0–44)
AST: 48 U/L — ABNORMAL HIGH (ref 15–41)
Albumin: 3.4 g/dL — ABNORMAL LOW (ref 3.5–5.0)
Alkaline Phosphatase: 125 U/L (ref 38–126)
Anion gap: 9 (ref 5–15)
BUN: 16 mg/dL (ref 8–23)
CO2: 24 mmol/L (ref 22–32)
Calcium: 8.8 mg/dL — ABNORMAL LOW (ref 8.9–10.3)
Chloride: 104 mmol/L (ref 98–111)
Creatinine, Ser: 1.29 mg/dL — ABNORMAL HIGH (ref 0.61–1.24)
GFR, Estimated: 59 mL/min — ABNORMAL LOW (ref >60)
Glucose, Bld: 127 mg/dL — ABNORMAL HIGH (ref 70–99)
Potassium: 4 mmol/L (ref 3.5–5.1)
Sodium: 137 mmol/L (ref 135–145)
Total Bilirubin: 1 mg/dL (ref 0.3–1.2)
Total Protein: 6.2 g/dL — ABNORMAL LOW (ref 6.5–8.1)

## 2020-10-16 LAB — GLUCOSE, CAPILLARY: Glucose-Capillary: 130 mg/dL — ABNORMAL HIGH (ref 70–99)

## 2020-10-16 LAB — APTT: aPTT: 31 seconds (ref 24–36)

## 2020-10-16 LAB — PROTIME-INR
INR: 1.1 (ref 0.8–1.2)
Prothrombin Time: 13.7 seconds (ref 11.4–15.2)

## 2020-10-16 MED ORDER — LIDOCAINE HCL (PF) 1 % IJ SOLN
INTRAMUSCULAR | Status: AC | PRN
  Administered 2020-10-16 (×2): 5 mL

## 2020-10-16 MED ORDER — DAPAGLIFLOZIN PROPANEDIOL 10 MG PO TABS: 10.0000 mg | ORAL_TABLET | Freq: Every day | ORAL | 3 refills | Status: DC

## 2020-10-16 MED ORDER — MIDAZOLAM HCL 2 MG/2ML IJ SOLN
INTRAMUSCULAR | Status: AC | PRN
  Administered 2020-10-16 (×2): 1 mg via INTRAVENOUS

## 2020-10-16 MED ORDER — SODIUM CHLORIDE 0.9 % IV SOLN: INTRAVENOUS | Status: DC

## 2020-10-16 MED ORDER — IOHEXOL 350 MG/ML SOLN
100.0000 mL | Freq: Once | INTRAVENOUS | Status: AC | PRN
  Administered 2020-10-16: 15 mL via INTRA_ARTERIAL

## 2020-10-16 MED ORDER — LIDOCAINE HCL 1 % IJ SOLN
INTRAMUSCULAR | Status: AC
  Filled 2020-10-16: qty 20

## 2020-10-16 MED ORDER — IOHEXOL 300 MG/ML  SOLN
50.0000 mL | Freq: Once | INTRAMUSCULAR | Status: AC | PRN
  Administered 2020-10-16: 25 mL via INTRA_ARTERIAL

## 2020-10-16 MED ORDER — FENTANYL CITRATE (PF) 100 MCG/2ML IJ SOLN
INTRAMUSCULAR | Status: AC
  Filled 2020-10-16: qty 2

## 2020-10-16 MED ORDER — TECHNETIUM TO 99M ALBUMIN AGGREGATED
4.3000 | Freq: Once | INTRAVENOUS | Status: AC | PRN
  Administered 2020-10-16: 4.3 via INTRAVENOUS

## 2020-10-16 MED ORDER — IOHEXOL 350 MG/ML SOLN
100.0000 mL | Freq: Once | INTRAVENOUS | Status: AC | PRN
  Administered 2020-10-16: 35 mL via INTRA_ARTERIAL

## 2020-10-16 MED ORDER — MIDAZOLAM HCL 2 MG/2ML IJ SOLN
INTRAMUSCULAR | Status: AC
  Filled 2020-10-16: qty 4

## 2020-10-16 MED ORDER — FENTANYL CITRATE (PF) 100 MCG/2ML IJ SOLN
INTRAMUSCULAR | Status: AC | PRN
  Administered 2020-10-16 (×2): 50 ug via INTRAVENOUS

## 2020-10-16 NOTE — Patient Instructions (Addendum)
Devin Hanna and Devin Hanna,   Devin Hanna comes from [name] patient assistance [phone]), they use [facility] ([phone])  [name] comes from [place] patient assistance [phone]), they use [facility] pharmacy in [place] [phone])  Devin Hanna comes from [name] patient assistance [phone]), they use [facility] pharmacy in [place] [phone])  Take care!  [place], [name] [phone]

## 2020-10-16 NOTE — Telephone Encounter (Signed)
Medication Samples have been provided to the patient.  Drug name: Wilder Glade       Strength: 10 mg        Qty: 2 boxes  LOT: [id number]  Exp.Date: 01/09/23  Dosing instructions: Take 1 tablet by mouth daily  The patient has been instructed regarding the correct time, dose, and frequency of taking this medication, including desired effects and most common side effects.   [name] 8:56 AM 10/16/2020

## 2020-10-16 NOTE — Sedation Documentation (Signed)
To nuc med for study.

## 2020-10-16 NOTE — Chronic Care Management (AMB) (Signed)
Chronic Care Management Pharmacy Note  10/16/2020 Name:  Devin Hanna MRN:  [id number] DOB:  19-Nov-1950   Subjective: Devin Hanna is an 70 y.o. year old male who is a primary patient of [name], Angela Adam, MD.  The CCM team was consulted for assistance with disease management and care coordination needs.    Engaged with patient's daughter, [name], by telephone for  medication access  in response to provider referral for pharmacy case management and/or care coordination services.   Consent to Services:  The patient was given information about Chronic Care Management services, agreed to services, and gave verbal consent prior to initiation of services.  Please see initial visit note for detailed documentation.   Patient Care Team: [name], MD as PCP - General (Family Medicine) [name], MD as PCP - Cardiology (Cardiology) Vickie Epley, MD as PCP - Electrophysiology (Cardiology) Clent Jacks, RN as Oncology Nurse Navigator [name], RPH-CPP (Pharmacist)   Objective:  Lab Results  Component Value Date   CREATININE 1.29 (H) 10/16/2020   CREATININE 1.47 10/04/2020   CREATININE 1.70 (H) 08/21/2020    Lab Results  Component Value Date   HGBA1C 6.4 10/04/2020   Last diabetic Eye exam: No results found for: HMDIABEYEEXA  Last diabetic Foot exam: No results found for: HMDIABFOOTEX      Component Value Date/Time   CHOL 103 06/26/2020 0816   CHOL 103 06/07/2014 0824   TRIG 159.0 (H) 06/26/2020 0816   HDL 34.50 (L) 06/26/2020 0816   HDL 38 (L) 06/07/2014 0824   CHOLHDL 3 06/26/2020 0816   VLDL 31.8 06/26/2020 0816   LDLCALC 37 06/26/2020 0816   LDLCALC 59 03/30/2018 0930    Hepatic Function Latest Ref Rng & Units 10/16/2020 08/21/2020 07/29/2020  Total Protein 6.5 - 8.1 g/dL 6.2(L) 7.1 -  Albumin 3.5 - 5.0 g/dL 3.4(L) 3.6 -  AST 15 - 41 U/L 48(H) 33 -  ALT 0 - 44 U/L 33 25 -  Alk Phosphatase 38 - 126 U/L 125 116 153(H)  Total  Bilirubin 0.3 - 1.2 mg/dL 1.0 1.0 -  Bilirubin, Direct - - - -    Lab Results  Component Value Date/Time   TSH 3.420 07/05/2020 03:03 PM   TSH 0.889 09/09/2014 03:05 AM   TSH 3.056 04/04/2013 10:20 AM    CBC Latest Ref Rng & Units 10/16/2020 08/21/2020 07/05/2020  WBC 4.0 - 10.5 K/uL 5.8 6.8 6.9  Hemoglobin 13.0 - 17.0 g/dL 15.0 14.8 14.8  Hematocrit 39.0 - 52.0 % 44.6 43.1 43.0  Platelets 150 - 400 K/uL 154 162 201    No results found for: VD25OH  Clinical ASCVD: No  The ASCVD Risk score [name], et al., 2013) failed to calculate for the following reasons:   The valid total cholesterol range is 130 to 320 mg/dL      Social History   Tobacco Use  Smoking Status Former   Packs/day: 3.00   Years: 40.00   Pack years: 120.00   Types: Cigarettes   Quit date: 04/05/1995   Years since quitting: 25.5  Smokeless Tobacco Former  Tobacco Comments   started smoking at age 1   BP Readings from Last 3 Encounters:  10/16/20 (!) 150/78  10/04/20 130/70  09/05/20 (!) 94/58   Pulse Readings from Last 3 Encounters:  10/16/20 70  10/04/20 70  09/05/20 70   Wt Readings from Last 3 Encounters:  10/04/20 235 lb 9.6  oz (106.9 kg)  09/05/20 234 lb 9.6 oz (106.4 kg)  08/21/20 234 lb (106.1 kg)    Assessment: Review of patient past medical history, allergies, medications, health status, including review of consultants reports, laboratory and other test data, was performed as part of comprehensive evaluation and provision of chronic care management services.   SDOH:  (Social Determinants of Health) assessments and interventions performed:  SDOH Interventions    Flowsheet Row Most Recent Value  SDOH Interventions   Financial Strain Interventions Other (Comment)  [manufacturer assistance]       CCM Care Plan  No Known Allergies  Medications Reviewed Today     Reviewed by Alena Bills, RN (Registered Nurse) on 10/16/20 at [facility] List Status: <None>   Medication  Order Taking? Sig Documenting Provider Last Dose Status Informant  atorvastatin (LIPITOR) 40 MG tablet [id number] Yes TAKE ONE TABLET BY MOUTH EVERY DAY Theora Gianotti, NP 10/16/2020 0530 Active   carvedilol (COREG) 6.25 MG tablet [id number] Yes TAKE 1 TABLET BY MOUTH TWICE DAILY WITH A MEAL [name], MD 10/16/2020 0530 Active   CINNAMON PO [id number] Yes Take 1,000 mg by mouth in the morning and at bedtime. [provider] 10/16/2020 0530 Active Child  clopidogrel (PLAVIX) 75 MG tablet [id number] Yes TAKE ONE TABLET EVERY MORNING WITH BREAKFAST [name], NP 10/16/2020 0530 Active Child  dapagliflozin propanediol ([name]) 10 MG TABS tablet [id number]  Take 1 tablet (10 mg total) by mouth daily before breakfast. [name], MD  Active Child  Dulaglutide ([name]) 3 KG/4.0NU SOPN [id number] No Inject 3 mg as directed once a week. [name], MD 10/09/2020 Active Child  ferrous sulfate 325 (65 FE) MG tablet [id number] Yes Take 162.5 mg by mouth daily with breakfast. [provider] 10/16/2020 0530 Active   furosemide (LASIX) 40 MG tablet [id number]  Take 0.5 tablets (20 mg total) by mouth daily. [name], MD  Active   gabapentin (NEURONTIN) 300 MG capsule [id number] Yes Take 1 capsule (300 mg total) by mouth every morning AND 1 capsule (300 mg total) daily with lunch AND 2 capsules (600 mg total) at bedtime. [name], MD 10/16/2020 0530 Active   Multiple Vitamin (MULTIVITAMIN WITH MINERALS) TABS [id number] Yes Take 1 tablet by mouth daily. Multivitamin for Men 50+ [provider] 10/16/2020 0530 Active Child  nitroGLYCERIN (NITROSTAT) 0.4 MG SL tablet [id number]  Place 1 tablet (0.4 mg total) under the tongue every 5 (five) minutes as needed for chest pain. [name], MD  Active            Med Note Tawanna Solo Oct 16, 2020  7:25 AM) Has medication, has never taken  pantoprazole (PROTONIX) 40 MG tablet [id number] Yes TAKE  ONE TABLET TWICE DAILY [name], NP 10/16/2020 0530 Active   sacubitril-valsartan (ENTRESTO) 49-51 MG [id number] Yes Take 1 tablet by mouth 2 (two) times daily.  Patient taking differently: Take 1 tablet by mouth 2 (two) times daily.   [name], MD 10/16/2020 0530 Active             Patient Active Problem List   Diagnosis Date Noted   Persistent atrial fibrillation ([place]) 09/05/2020   Secondary hypercoagulable state ([place]) 09/05/2020   Cough 01/11/2020   Left knee pain 01/11/2020   Abnormal cardiovascular stress test    Acute on chronic systolic heart failure ([place]) 07/30/2019   AK (actinic keratosis) 04/03/2019   Colon  cancer screening    Rectal polyp    Polyp of transverse colon    Liver lesion 12/06/2018   Chronic kidney disease (CKD), stage III (moderate) (HCC) 12/06/2018   Abrasion 12/06/2018   History of anemia 03/30/2018   Hyperpigmented skin lesion 12/20/2017   Bilateral foot pain 12/20/2017   DDD (degenerative disc disease), lumbar 08/18/2017   SCC (squamous cell carcinoma), trunk 05/17/2017   Venous insufficiency 02/12/2017   Decreased pedal pulses 02/12/2017   Skin lesion 02/12/2017   Neuropathy 01/09/2016   Elevated LFTs 06/28/2015   History of penile cancer 06/28/2015   Peptic ulcer disease 09/12/2014   Erectile dysfunction 03/47/4259   Chronic systolic heart failure ([place]) 05/15/2013   Hyperlipidemia 05/15/2013   DM type 2 (diabetes mellitus, type 2) ([place]) 04/07/2013   Other and unspecified hyperlipidemia 04/07/2013   Morbid obesity ([place]) 04/07/2013   HTN (hypertension) 04/07/2013   CAD (coronary artery disease) 04/04/2013    Immunization History  Administered Date(s) Administered   Fluad Quad(high Dose 65+) 10/26/2018, 01/11/2020   Influenza, High Dose Seasonal PF 01/09/2016, 10/27/2016, 12/20/2017   Pneumococcal Conjugate-13 01/09/2016   Pneumococcal Polysaccharide-23 09/09/2014   Tdap 01/21/2018    Conditions to be  addressed/monitored: CHF, HTN, and DMII  Care Plan : Medication Management  Updates made by [name], RPH-CPP since 10/16/2020 12:00 AM     Problem: Diabetes, CHF, HTN      Long-Range Goal: Disease Progression Prevention   Start Date: 08/08/2020  This Visit's Progress: On track  Recent Progress: On track  Priority: High  Note:   Current Barriers:  Unable to independently afford treatment regimen Unable to achieve control of diabetes  Complex patient with multiple comorbidities including HFrEF, CAD, HLD  Pharmacist Clinical Goal(s):  Over the next 90 days, patient will verbalize ability to afford treatment regimen. Over the next 90 days, patient will achieve adherence to monitoring guidelines and medication adherence to achieve therapeutic efficacy. Over the next 90 days, patient will achieve control of diabetes as evidenced by improvement in A1c  Interventions: 1:1 collaboration with [name], MD regarding development and update of comprehensive plan of care as evidenced by provider attestation and co-signature Inter-disciplinary care team collaboration (see longitudinal plan of care) Comprehensive medication review performed; medication list updated in electronic medical record   Diabetes: Controlled, relaxed goal of A1c <7.5% likely appropriate given age, comorbidities; current treatment: [name] 3 mg weekly; [name] 10 mg daily Metformin d/c d/t renal function  Approved for patient assistance for [name], [name] through 02/08/21.  Kelly requests refills on [name] through [facility]. Refill sent today. Encouraged [name] to call tomorrow to place refill request. Patient running low on supply - will prepare sample to tide him over until order arrives.   HFrEF, (most recent EF 30-35%); s/p CRT-D placement 2/28 Appropriately managed; current treatment: furosemide 20 mg daily, carvedilol 6.25 mg BID, Entresto 49/51 mg BID; follows w/ Dr. Fletcher Anon, Dr.  Cleotilde Neer through [employer] assistance through 2022. Previously recommended to continue current regimen at this time.   Hyperlipidemia, secondary ASCVD prevention (hx MI w/ RCA stenting) Controlled per last lipid panel; current treatment: atorvastatin 40 mg daily Antiplatelet regimen: clopidogrel 75 mg daily Previously recommended to continue current regimen at this time.   GERD/hx peptic ulcer disease: Appropriately managed and controlled per patient report; current treatment: pantoprazole 40 mg BID Previously recommended to continue current regimen at this time. PPI use appropriate given PUD.  Supplement: Cinnamon  Patient Goals/Self-Care Activities Over the next  90 days, patient will:  - take medications as prescribed check glucose 3-4 times weekly, document, and provide at future appointments check blood pressure twice daily, document, and provide at future appointments weigh daily, and contact provider if weight gain of greater than 3 lbs in a day or greater than 5 lbs in a week collaborate with provider on medication access solutions  Follow Up Plan: Telephone follow up appointment with care management team member scheduled for: ~ 2 months as previously scheduled       Medication Assistance:  [name], Wilder Glade obtained through [name] medication assistance program.  Enrollment ends 02/08/21  Patient's preferred pharmacy is:  [facility] [place] [facility] [address] [place] [address] Phone: [phone] Fax: [phone]  [facility] - [place], [place] [place] [address] Phone: [phone] Fax: [phone]   Follow Up:  Patient agrees to Care Plan and Follow-up.  Plan: Telephone follow up appointment with care management team member scheduled for:  ~ 2 months as previously scheduled

## 2020-10-16 NOTE — Discharge Instructions (Signed)
Please call Interventional Radiology clinic [phone] with any questions or concerns.  You may remove your dressing and shower tomorrow.    Hepatic Artery Radioembolization, Care After The following information offers guidance on how to care for yourself after your procedure. Your health care provider may also give you more specific instructions. If you have problems or questions, contact your health care provider. What can I expect after the procedure? After the procedure, it is possible to have: A slight fever for 7 to 10 days. This may be accompanied by pain, nausea, or vomiting, which is referred to as post-embolization syndrome. You may be given medicine to help relieve these symptoms. If your fever gets worse, tell your health care provider. Tiredness (fatigue). Loss of appetite. This should gradually improve after about 1 week. Abdominal pain on your right side. Soreness and tenderness in your groin area where the needle and catheter were placed (puncture site). Follow these instructions at home: Puncture site care Follow instructions from your health care provider about how to take care of the puncture site. Make sure you: Wash your hands with soap and water for at least 20 seconds before and after you change your bandage (dressing). If soap and water are not available, use hand sanitizer. Change your dressing as told by your health care provider. Check your puncture site every day for signs of infection. Check for: More redness, swelling, or pain. Fluid or blood. Warmth. Pus or a bad smell. Activity Rest as told by your health care provider. Return to your normal activities as told by your health care provider. Ask your health care provider what activities are safe for you. Avoid sitting for a long time without moving. Get up to take short walks every 1-2 hours. This is important to improve blood flow and breathing. Ask for help if you feel weak or unsteady. If you were given  a sedative during the procedure, it can affect you for several hours. Do not drive or operate machinery until your health care provider says that it is safe. Do not lift anything that is heavier than 10 lb (4.5 kg), or the limit that you are told, until your health care provider says that it is safe. Medicines Take over-the-counter and prescription medicines only as told by your health care provider. Ask your health care provider if the medicine prescribed to you: Requires you to avoid driving or using machinery. Can cause constipation. You may need to take these actions to prevent or treat constipation: Drink enough fluid to keep your urine pale yellow. Take over-the-counter or prescription medicines. Eat foods that are high in fiber, such as beans, whole grains, and fresh fruits and vegetables. Limit foods that are high in fat and processed sugars, such as fried or sweet foods. Radiation precautions For up to a week after your procedure, there will be a small amount of radioactivity near your liver. This is not especially dangerous to other people. However, as told by your health care provider, you should follow these precautions for 7 days: Do not come in close contact with people. Do not sleep in the same bed as someone else. Do not hold children or babies. Do not have contact with pregnant women. General instructions Eat frequent, small meals until your appetite returns. Follow instructions from your health care provider about eating or drinking restrictions. Do not take baths, swim, or use a hot tub until your health care provider approves. You may take showers. Wash your puncture site with mild soap  and water, and pat the area dry. Wear compression stockings as told by your health care provider. These stockings help to prevent blood clots and reduce swelling in your legs. Keep all follow-up visits. This is important. You may need to have blood tests and imaging tests. Contact a health  care provider if: You have any of these signs of infection: More redness, swelling, or pain around your puncture site. Fluid or blood coming from your puncture site. Warmth coming from your puncture site. Pus or a bad smell coming from your puncture site. You have pain that: Gets worse. Does not get better with medicine. Feels like very bad heartburn. Is in the middle of your abdomen, above your belly button. You have any signs of infection or liver failure, such as: Your skin or the white parts of your eyes turn yellow (jaundice). The color of your urine changes to dark brown. The color of your stool (feces) changes to light yellow. Your abdominal measurement (girth) increases in a short period of time. You gain more than 5 lb (2.3 kg) in a short period of time. Get help right away if: You have a fever that lasts more than 10 days or is higher than what your health care provider told you to expect. You develop any of the following in your legs: Pain. Swelling. Skin that is cold or pale or turns blue. You have chest pain. You have blood in your vomit, saliva, or stool. You have trouble breathing. These symptoms may represent a serious problem that is an emergency. Do not wait to see if the symptoms will go away. Get medical help right away. Call your local emergency services (911 in the [place] Do not drive yourself to the hospital. Summary After the procedure, it is possible to have a slight fever for up to 7-10 days, tiredness, loss of appetite, abdominal pain on the right side, and groin tenderness where the catheter was placed. Do not come in close contact with people for up to a week after your procedure, as told by your health care provider. Follow instructions from your health care provider about how to take care of the puncture site. Contact a health care provider if you have any signs of infection. Get help right away if you develop pain or swelling in your legs or if your  legs feel cool or look pale. This information is not intended to replace advice given to you by your health care provider. Make sure you discuss any questions you have with your health care provider. Document Revised: 12/31/2019 Document Reviewed: 12/31/2019 Elsevier Patient Education  2022 [place] NEXT PROCEDURE Post Y-90 Radioembolization Discharge Instructions  You have been given a radioactive material during your procedure.  While it is safe for you to be discharged home from the hospital, you need to proceed directly home.    Do not use public transportation, including air travel, lasting more than 2 hours for 1 week.  Avoid crowded public places for 1 week.  Adult visitors should try to avoid close contact with you for 1 week.    Children and pregnant females should not visit or have close contact with you for 1 week.  Items that you touch are not radioactive.  Do not sleep in the same bed as your partner for 1 week, and a condom should be used for sexual activity during the first 24 hours.  Your blood may be radioactive and caution should be used if  any bleeding occurs during the recovery period.  Body fluids may be radioactive for 24 hours.  Wash your hands after voiding.  Men should sit to urinate.  Dispose of any soiled materials (flush down toilet or place in trash at home) during the first day.  Drink 6 to 8 glasses of fluids per day for 5 days to hydrate yourself.  If you need to see a doctor during the first week, you must let them know that you were treated with yttrium-90 microspheres, and will be slightly radioactive.  They can call Interventional Radiology [phone] with any questions.   Moderate Conscious Sedation, Adult, Care After This sheet gives you information about how to care for yourself after your procedure. Your health care provider may also give you more specific instructions. If you have problems or questions, contact your health care  provider. What can I expect after the procedure? After the procedure, it is common to have: Sleepiness for several hours. Impaired judgment for several hours. Difficulty with balance. Vomiting if you eat too soon. Follow these instructions at home: For the time period you were told by your health care provider:    Rest. Do not participate in activities where you could fall or become injured. Do not drive or use machinery. Do not drink alcohol. Do not take sleeping pills or medicines that cause drowsiness. Do not make important decisions or sign legal documents. Do not take care of children on your own. Eating and drinking Follow the diet recommended by your health care provider. Drink enough fluid to keep your urine pale yellow. If you vomit: Drink water, juice, or soup when you can drink without vomiting. Make sure you have [place] or no nausea before eating solid foods. General instructions Take over-the-counter and prescription medicines only as told by your health care provider. Have a responsible adult stay with you for the time you are told. It is important to have someone help care for you until you are awake and alert. Do not smoke. Keep all follow-up visits as told by your health care provider. This is important. Contact a health care provider if: You are still sleepy or having trouble with balance after 24 hours. You feel light-headed. You keep feeling nauseous or you keep vomiting. You develop a rash. You have a fever. You have redness or swelling around the IV site. Get help right away if: You have trouble breathing. You have new-onset confusion at home. Summary After the procedure, it is common to feel sleepy, have impaired judgment, or feel nauseous if you eat too soon. Rest after you get home. Know the things you should not do after the procedure. Follow the diet recommended by your health care provider and drink enough fluid to keep your urine pale  yellow. Get help right away if you have trouble breathing or new-onset confusion at home. This information is not intended to replace advice given to you by your health care provider. Make sure you discuss any questions you have with your health care provider. Document Revised: 05/26/2019 Document Reviewed: 12/22/2018 Elsevier Patient Education  2022 [employer].

## 2020-10-16 NOTE — Procedures (Signed)
Interventional Radiology Procedure Note  Procedure:  1) Ultrasound guided right common femoral artery access 2) Selective catheterization and angiography of the replaced common hepatic, left hepatic, and right hepatic arteries 3) Cone beam CT 4) Tc-MAA administration through the right hepatic artery  Findings: Please refer to procedural dictation for full description.  Three discrete hyperenhancing masses in the right lobe supplied by right hepatic artery.  4.3 mCi Tc-MAA administered via right hepatic artery.  6 Fr Angioseal closure of right CFA.  Complications: None immediate  Estimated Blood Loss: < 5 mL  Recommendations: Strict 4 hour bedrest, flat for 2 hours (until 13:00), head of bed up to 30 degrees for 2 hours (until 15:00).   To NM from IR. Advance diet as tolerated. Plan for discharge home after bedrest complete.   Ruthann Cancer, MD Pager: [phone]

## 2020-10-16 NOTE — Telephone Encounter (Signed)
Noted  

## 2020-10-16 NOTE — Telephone Encounter (Signed)
See CCM documentation

## 2020-10-17 ENCOUNTER — Other Ambulatory Visit: Payer: Self-pay | Admitting: Family

## 2020-10-17 ENCOUNTER — Telehealth: Payer: Self-pay

## 2020-10-17 DIAGNOSIS — Z95810 Presence of automatic (implantable) cardiac defibrillator: Secondary | ICD-10-CM

## 2020-10-17 LAB — AFP TUMOR MARKER: AFP, Serum, Tumor Marker: 18.5 ng/mL — ABNORMAL HIGH (ref 0.0–8.4)

## 2020-10-17 NOTE — Telephone Encounter (Signed)
Rx(s) sent to pharmacy electronically.  

## 2020-10-17 NOTE — Telephone Encounter (Signed)
"  Alert remote reviewed. Normal device function.   Known persistent AF, no OAC due to bleeding issues, good ventricular rate control LV capture threshold is elevated, sent to triage Next remote 01/07/2021"  Routing to Dr. Quentin Ore for review and recommendations for increasing LV threshold.

## 2020-10-22 NOTE — Progress Notes (Signed)
Remote ICD transmission.   

## 2020-10-22 NOTE — Telephone Encounter (Signed)
[facility] alert for HV therapy. Event occurred 9/12 @ 08:45 EGM shows AF/VS followed by sustained VT, ATP delivered x1 with no change. HV therapy delivered 36J x1 converting rhythm to AF/BP Route to triage. LR  Successful telephone call with patient and patient's daughter. He denies receiving shock therapy. No symptoms of tachycardia. Medications reviewed. Taking all medications as prescribed. [place] driving restrictions and shock plan reviewed in detail. Patient has appointment with Dr. Fletcher Anon, [facility] 10/24/20 at 8:00 am. Will route to Dr. Quentin Ore for review and recommendations.

## 2020-10-22 NOTE — Telephone Encounter (Signed)
Spoke with Dr. Quentin Ore regarding ATP/shock therapy. Requested patient appointment tomorrow in [place]. Will forward to scheduling.

## 2020-10-23 ENCOUNTER — Ambulatory Visit (INDEPENDENT_AMBULATORY_CARE_PROVIDER_SITE_OTHER): Payer: PPO | Admitting: Cardiology

## 2020-10-23 ENCOUNTER — Ambulatory Visit
Admission: RE | Admit: 2020-10-23 | Discharge: 2020-10-23 | Disposition: A | Discharge status: Home / Self Care | Payer: PPO | Source: Ambulatory Visit | Attending: Cardiology | Admitting: Cardiology

## 2020-10-23 ENCOUNTER — Other Ambulatory Visit: Payer: Self-pay

## 2020-10-23 ENCOUNTER — Encounter: Payer: Self-pay | Admitting: Cardiology

## 2020-10-23 VITALS — BP 130/78 | HR 70 | Ht 70.0 in | Wt 236.0 lb

## 2020-10-23 DIAGNOSIS — Z95810 Presence of automatic (implantable) cardiac defibrillator: Secondary | ICD-10-CM | POA: Diagnosis not present

## 2020-10-23 DIAGNOSIS — Z4502 Encounter for adjustment and management of automatic implantable cardiac defibrillator: Secondary | ICD-10-CM | POA: Diagnosis not present

## 2020-10-23 DIAGNOSIS — R579 Shock, unspecified: Secondary | ICD-10-CM | POA: Diagnosis not present

## 2020-10-23 DIAGNOSIS — I4819 Other persistent atrial fibrillation: Secondary | ICD-10-CM | POA: Diagnosis not present

## 2020-10-23 MED ORDER — MEXILETINE HCL 150 MG PO CAPS: 150.0000 mg | ORAL_CAPSULE | Freq: Two times a day (BID) | ORAL | 1 refills | Status: DC

## 2020-10-23 MED ORDER — CARVEDILOL 12.5 MG PO TABS: 12.5000 mg | ORAL_TABLET | Freq: Two times a day (BID) | ORAL | 3 refills | Status: DC

## 2020-10-23 NOTE — Telephone Encounter (Signed)
Please have Mr Dicostanzo come in for a 2 view chest x ray when possible this week.   Thanks,  Lars Mage   Order place and patient sent mychart message.

## 2020-10-23 NOTE — Progress Notes (Signed)
Electrophysiology Office Follow up Visit Note:    Date:  10/23/2020   ID:  Devin Hanna, DOB 1950-03-07, MRN [id number]  PCP:  Devin Haven, MD  [place] Cardiologist:  Devin Sacramento, MD  [facility] Electrophysiologist:  Devin Epley, MD    Interval History:    Devin Hanna is a 70 y.o. male who presents for a follow up visit.  He had a biventricular ICD implanted April 08, 2020.  He has done well since that time but recently received an ICD shock.  He did not feel the shock.  He tells me he was around about the time that he usually was watching television at home.  He has not noticed a change in his heart failure symptoms.  He did recently have his Entresto and Lasix cut in half by Dr. Fletcher Anon because of some relatively low blood pressures in the 68T systolic. No syncopal episodes. The device clinic called him after his ICD shock 2 days ago and scheduled today's appointment.    Past Medical History:  Diagnosis Date   Acute respiratory failure with hypoxia ([place]) 06/2016   CAD (coronary artery disease)    a. Remote PCI of the RCA;  b. 03/2013 Inf STEMI/PCI: LAD 60-70d, RCA 99p ISR(rota/CBA/DES);  c. 09/2014 MV: inf and infsept infarct, EF 35-40%;  d. 09/2014 Cath: patent RCA stent, stable LAD dzs, EF 40%-->Med rx.   CHF (congestive heart failure) ([place])    Diabetes mellitus    sees Dr. Hardin Negus in [place]   GERD (gastroesophageal reflux disease)    HFrEF (heart failure with reduced ejection fraction) ([place])    Hyperlipidemia    Hypertension    Ischemic cardiomyopathy    a. 09/2014 EF 40% by V gram;  b. 06/2016 Echo: EF 40, diff HK, Gr1 DD, inf HK, no mural thrombus. Mild MR, mildly dil LA, mildly dil RV.    LBBB (left bundle branch block)    Old   Liver mass    Morbid obesity ([place])    Peripheral neuropathy    Upper GI bleed    Vertigo     Past Surgical History:  Procedure Laterality Date   BIV ICD INSERTION CRT-D N/A 04/08/2020   Procedure: BIV ICD  INSERTION CRT-D;  Surgeon: Devin Epley, MD;  Location: [place] [facility];  Service: Cardiovascular;  Laterality: N/A;   CARDIAC CATHETERIZATION N/A 10/03/2014   Procedure: Left Heart Cath and Coronary Angiography;  Surgeon: Devin Hampshire, MD;  Location: [place] [facility];  Service: Cardiovascular;  Laterality: N/A;   CIRCUMCISION N/A 07/22/2015   Procedure: CIRCUMCISION ADULT;  Surgeon: Devin Espy, MD;  Location: [facility] ORS;  Service: Urology;  Laterality: N/A;   COLONOSCOPY WITH PROPOFOL N/A 01/12/2019   Procedure: COLONOSCOPY WITH PROPOFOL;  Surgeon: Devin Lame, MD;  Location: [facility] ENDOSCOPY;  Service: Endoscopy;  Laterality: N/A;   CORONARY STENT PLACEMENT     ESOPHAGOGASTRODUODENOSCOPY (EGD) WITH PROPOFOL N/A 09/12/2014   Procedure: ESOPHAGOGASTRODUODENOSCOPY (EGD) WITH PROPOFOL;  Surgeon: Devin Lobo, MD;  Location: [facility] ENDOSCOPY;  Service: Endoscopy;  Laterality: N/A;   EXTERNAL FIXATION LEG Left 04/03/2012   Procedure: EXTERNAL FIXATION LEG;  Surgeon: Devin Simmer, MD;  Location: [place];  Service: Orthopedics;  Laterality: Left;   EXTERNAL FIXATION REMOVAL Left 04/14/2012   Procedure: REMOVAL EXTERNAL FIXATION LEG;  Surgeon: Devin Simmer, MD;  Location: [place];  Service: Orthopedics;  Laterality: Left;   IR 3D INDEPENDENT WKST  10/16/2020   IR ANGIOGRAM SELECTIVE  EACH ADDITIONAL VESSEL  10/16/2020   IR ANGIOGRAM VISCERAL SELECTIVE  10/16/2020   IR EMBO ARTERIAL NOT HEMORR HEMANG INC GUIDE ROADMAPPING  10/16/2020   IR RADIOLOGIST EVAL & MGMT  09/06/2020   IR US GUIDE VASC ACCESS RIGHT  10/16/2020   IRRIGATION AND DEBRIDEMENT KNEE Left 04/03/2012   Procedure: IRRIGATION AND DEBRIDEMENT KNEE;  Surgeon: Devin Simmer, MD;  Location: [place];  Service: Orthopedics;  Laterality: Left;   LEFT HEART CATHETERIZATION WITH CORONARY ANGIOGRAM N/A 04/05/2013   Procedure: LEFT HEART CATHETERIZATION WITH CORONARY ANGIOGRAM;  Surgeon: Devin Hampshire, MD;  Location: [place] CATH LAB;  Service: Cardiovascular;   Laterality: N/A;   MASS EXCISION N/A 07/22/2015   Procedure: EXCISION PENILE MASS;  Surgeon: Devin Espy, MD;  Location: [facility] ORS;  Service: Urology;  Laterality: N/A;   ORIF TIBIA PLATEAU Left 04/14/2012   Procedure: OPEN REDUCTION INTERNAL FIXATION (ORIF) TIBIAL PLATEAU;  Surgeon: Devin Simmer, MD;  Location: [place];  Service: Orthopedics;  Laterality: Left;   PERCUTANEOUS CORONARY ROTOBLATOR INTERVENTION (PCI-R) N/A 04/06/2013   Procedure: PERCUTANEOUS CORONARY ROTOBLATOR INTERVENTION (PCI-R);  Surgeon: Devin Hampshire, MD;  Location: [facility] CATH LAB;  Service: Cardiovascular;  Laterality: N/A;   RIGHT/LEFT HEART CATH AND CORONARY ANGIOGRAPHY N/A 08/21/2019   Procedure: RIGHT/LEFT HEART CATH AND CORONARY ANGIOGRAPHY;  Surgeon: Devin Hampshire, MD;  Location: [place] CV LAB;  Service: Cardiovascular;  Laterality: N/A;    Current Medications: Current Meds  Medication Sig   atorvastatin (LIPITOR) 40 MG tablet TAKE ONE TABLET BY MOUTH EVERY DAY   carvedilol (COREG) 6.25 MG tablet TAKE 1 TABLET BY MOUTH TWICE DAILY WITH A MEAL   CINNAMON PO Take 1,000 mg by mouth in the morning and at bedtime.   clopidogrel (PLAVIX) 75 MG tablet TAKE ONE TABLET EVERY MORNING WITH BREAKFAST   dapagliflozin propanediol ([name]) 10 MG TABS tablet Take 1 tablet (10 mg total) by mouth daily before breakfast.   Dulaglutide ([name]) 3 LP/5.3YY SOPN Inject 3 mg as directed once a week.   ferrous sulfate 325 (65 FE) MG tablet Take 162.5 mg by mouth daily with breakfast.   furosemide (LASIX) 40 MG tablet TAKE 1 TABLET BY MOUTH DAILY (Patient taking differently: Take 20 mg by mouth daily.)   gabapentin (NEURONTIN) 300 MG capsule Take 1 capsule (300 mg total) by mouth every morning AND 1 capsule (300 mg total) daily with lunch AND 2 capsules (600 mg total) at bedtime.   Multiple Vitamin (MULTIVITAMIN WITH MINERALS) TABS Take 1 tablet by mouth daily. Multivitamin for Men 50+   nitroGLYCERIN (NITROSTAT) 0.4 MG SL tablet  Place 1 tablet (0.4 mg total) under the tongue every 5 (five) minutes as needed for chest pain.   pantoprazole (PROTONIX) 40 MG tablet TAKE 1 TABLET BY MOUTH TWICE DAILY.   sacubitril-valsartan (ENTRESTO) 49-51 MG Take 1 tablet by mouth 2 (two) times daily. (Patient taking differently: Take 1 tablet by mouth 2 (two) times daily.)     Allergies:   Patient has no known allergies.   Social History   Socioeconomic History   Marital status: Divorced    Spouse name: Not on file   Number of children: Not on file   Years of education: Not on file   Highest education level: Not on file  Occupational History   Occupation: heavy Company secretary    Employer: [employer]  Tobacco Use   Smoking status: Former    Packs/day: 3.00    Years: 40.00    Pack  years: 120.00    Types: Cigarettes    Quit date: 04/05/1995    Years since quitting: 25.5   Smokeless tobacco: Former   Tobacco comments:    started smoking at age 65  Vaping Use   Vaping Use: Never used  Substance and Sexual Activity   Alcohol use: No    Comment: Occassional Use   Drug use: No   Sexual activity: Not on file  Other Topics Concern   Not on file  Social History Narrative   Heavy Company secretary.; Divorced; quit smoking 25 years; beer rare; by self. Still riding motor cycles.    Social Determinants of Health   Financial Resource Strain: High Risk   Difficulty of Paying Living Expenses: Hard  Food Insecurity: Not on file  Transportation Needs: Not on file  Physical Activity: Not on file  Stress: Not on file  Social Connections: Not on file     Family History: The patient's family history includes Breast cancer in his sister; Coronary artery disease in his brother and father; Lung cancer in his brother; Prostate cancer in his brother; Stroke in his sister.  ROS:   Please see the history of present illness.    All other systems reviewed and are negative.  EKGs/Labs/Other Studies Reviewed:    The  following studies were reviewed today:  October 23, 2020 in clinic device interrogation personally reviewed Battery longevity 6.6 years Lead parameters are stable.  The LV capture threshold is 2.87 at 0.5 which is higher than the implant measurement.  Lead impedances are relatively stable.  He is biventricular pacing 96% 1 ICD therapy delivered after an unsuccessful ATP. Review of EGM's suggests biventricular paced beats followed by nonsustained VT followed by a sustained monomorphic VT that does not respond to ATP.  Cycle length of the tachycardia is 223 ms.  The ventricular arrhythmia terminates with a single 36 J shock.  The atrial fibrillation continues after the ICD shock.  EKG:  The ekg ordered today demonstrates atrial fibrillation.  Biventricular paced rhythm  Recent Labs: 07/05/2020: TSH 3.420 10/16/2020: ALT 33; BUN 16; Creatinine, Ser 1.29; Hemoglobin 15.0; Platelets 154; Potassium 4.0; Sodium 137  Recent Lipid Panel    Component Value Date/Time   CHOL 103 06/26/2020 0816   CHOL 103 06/07/2014 0824   TRIG 159.0 (H) 06/26/2020 0816   HDL 34.50 (L) 06/26/2020 0816   HDL 38 (L) 06/07/2014 0824   CHOLHDL 3 06/26/2020 0816   VLDL 31.8 06/26/2020 0816   LDLCALC 37 06/26/2020 0816   LDLCALC 59 03/30/2018 0930    Physical Exam:    VS:  BP 130/78 (BP Location: Left Arm, Patient Position: Sitting, Cuff Size: Normal)   Pulse 70   Ht 5' 10"  (1.778 m)   Wt 236 lb (107 kg)   SpO2 94%   BMI 33.86 kg/m     Wt Readings from Last 3 Encounters:  10/23/20 236 lb (107 kg)  10/04/20 235 lb 9.6 oz (106.9 kg)  09/05/20 234 lb 9.6 oz (106.4 kg)     GEN:  Well nourished, well developed in no acute distress HEENT: Normal NECK: No JVD; No carotid bruits LYMPHATICS: No lymphadenopathy CARDIAC: Irregularly irregular, no murmurs, rubs, gallops.  CRT-D pocket well-healed. RESPIRATORY:  Clear to auscultation without rales, wheezing or rhonchi  ABDOMEN: Soft, non-tender,  non-distended MUSCULOSKELETAL:  No edema; No deformity  SKIN: Warm and dry NEUROLOGIC:  Alert and oriented x 3 PSYCHIATRIC:  Normal affect   ASSESSMENT:    1.  Persistent atrial fibrillation ([place])   2. ICD (implantable cardioverter-defibrillator) in place   3. Cardiac resynchronization therapy defibrillator (CRT-D) in place   4. ICD (implantable cardioverter-defibrillator) discharge    PLAN:    In order of problems listed above:   1. Persistent atrial fibrillation (HCC) Likely permanent atrial fibrillation.  Not on anticoagulation because of esophageal varices, active malignancy.  This has been discussed in detail with the patient and his family members.  2. 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  I would like to start him on mexiletine given his history of coronary artery disease.  I will also increase his Coreg to 12.5 mg by mouth twice daily.  He will keep a check on his blood pressures at home.  Ideally, would increase his Entresto back but will need to monitor his blood pressure after the increase in Coreg.  I will also check blood work today including a BMP and magnesium to make sure these were not contributing to his ICD shock.  We will check a chest x-ray to confirm lead position given the stable yet elevated left ventricular pacing threshold.  3. Cardiac resynchronization therapy defibrillator (CRT-D) in place Device functioning well.  Appropriate ICD shock as above.  4. ICD (implantable cardioverter-defibrillator) discharge See above     Total time spent with patient today 40 minutes. This includes reviewing records, evaluating the patient and coordinating care.   Medication Adjustments/Labs and Tests Ordered: Current medicines are reviewed at length with the patient today.  Concerns regarding medicines are outlined above.  Orders Placed This Encounter   Procedures   DG Chest 2 View   Basic metabolic panel   Magnesium   EKG 12-Lead   No orders of the defined types were placed in this encounter.    Signed, Lars Mage, MD, [facility], [facility] 10/23/2020 2:10 PM    Electrophysiology [facility]

## 2020-10-23 NOTE — Patient Instructions (Signed)
Medication Instructions:  Your physician has recommended you make the following change in your medication:   ** Your Carvedilol has been increased to 12.68m - 1 tablet by mouth twice daily with a meal.  A new prescription has been sent to your pharmacy.  ** Begin Mexilitine 1574m- 1 capsule by mouth twice daily   *If you need a refill on your cardiac medications before your next appointment, please call your pharmacy*   Lab Work: BMP and Mg today  If you have labs (blood work) drawn today and your tests are completely normal, you will receive your results only by: My[place]if you have MyChart) OR A paper copy in the mail If you have any lab test that is abnormal or we need to change your treatment, we will call you to review the results.   Testing/Procedures: Chest Xray today   Follow-Up: [facility].  As part of our continuing mission to provide you with exceptional heart care, we have created designated Provider Care Teams.  These Care Teams include your primary Cardiologist (physician) and Advanced Practice Providers (APPs -  Physician Assistants and Nurse Practitioners) who all work together to provide you with the care you need, when you need it.  We recommend signing up for the patient portal called "MyChart".  Sign up information is provided on this After Visit Summary.  MyChart is used to connect with patients for Virtual Visits (Telemedicine).  Patients are able to view lab/test results, encounter notes, upcoming appointments, etc.  Non-urgent messages can be sent to your provider as well.   To learn more about what you can do with MyChart, go to [email or web address]   Your next appointment:   Follow up with Dr LaQuentin Oren 3 months

## 2020-10-23 NOTE — Addendum Note (Signed)
Addended by: Darrell Jewel on: 10/23/2020 08:06 AM   Modules accepted: Orders

## 2020-10-24 ENCOUNTER — Encounter: Payer: Self-pay | Admitting: Cardiovascular Disease

## 2020-10-24 ENCOUNTER — Ambulatory Visit: Payer: PPO | Admitting: Cardiovascular Disease

## 2020-10-24 ENCOUNTER — Ambulatory Visit (INDEPENDENT_AMBULATORY_CARE_PROVIDER_SITE_OTHER): Payer: PPO | Admitting: Pharmacist

## 2020-10-24 VITALS — BP 122/70 | HR 72 | Ht 70.0 in | Wt 239.1 lb

## 2020-10-24 DIAGNOSIS — I25118 Atherosclerotic heart disease of native coronary artery with other forms of angina pectoris: Secondary | ICD-10-CM

## 2020-10-24 DIAGNOSIS — I4819 Other persistent atrial fibrillation: Secondary | ICD-10-CM

## 2020-10-24 DIAGNOSIS — I5022 Chronic systolic (congestive) heart failure: Secondary | ICD-10-CM

## 2020-10-24 DIAGNOSIS — E785 Hyperlipidemia, unspecified: Secondary | ICD-10-CM | POA: Diagnosis not present

## 2020-10-24 DIAGNOSIS — I10 Essential (primary) hypertension: Secondary | ICD-10-CM | POA: Diagnosis not present

## 2020-10-24 DIAGNOSIS — E1142 Type 2 diabetes mellitus with diabetic polyneuropathy: Secondary | ICD-10-CM

## 2020-10-24 DIAGNOSIS — I2510 Atherosclerotic heart disease of native coronary artery without angina pectoris: Secondary | ICD-10-CM

## 2020-10-24 LAB — BASIC METABOLIC PANEL
BUN/Creatinine Ratio: 13 (ref 10–24)
BUN: 16 mg/dL (ref 8–27)
CO2: 22 mmol/L (ref 20–29)
Calcium: 8.9 mg/dL (ref 8.6–10.2)
Chloride: 103 mmol/L (ref 96–106)
Creatinine, Ser: 1.22 mg/dL (ref 0.76–1.27)
Glucose: 124 mg/dL — ABNORMAL HIGH (ref 65–99)
Potassium: 4.2 mmol/L (ref 3.5–5.2)
Sodium: 140 mmol/L (ref 134–144)
eGFR: 63 mL/min/{1.73_m2} (ref >59)

## 2020-10-24 LAB — MAGNESIUM: Magnesium: 2.1 mg/dL (ref 1.6–2.3)

## 2020-10-24 MED ORDER — DAPAGLIFLOZIN PROPANEDIOL 10 MG PO TABS: 10.0000 mg | ORAL_TABLET | Freq: Every day | ORAL | 3 refills | Status: DC

## 2020-10-24 MED ORDER — FUROSEMIDE 40 MG PO TABS: 40.0000 mg | ORAL_TABLET | Freq: Every day | ORAL | 1 refills | Status: AC

## 2020-10-24 NOTE — Patient Instructions (Signed)
Visit Information  PATIENT GOALS:  Goals Addressed               This Visit's Progress     Patient Stated     Medication Monitoring (pt-stated)        Patient Goals/Self-Care Activities Over the next 90 days, patient will:  - take medications as prescribed check glucose 3-5 times weekly, document, and provide at future appointments check blood pressure 3-5 times weekly, document, and provide at future appointments weigh daily, and contact provider if weight gain of greater than 3 lbs in a day or greater than 5 lbs in a week collaborate with provider on medication access solutions         Patient verbalizes understanding of instructions provided today and agrees to view in [place].   Plan: Telephone follow up appointment with care management team member scheduled for:  2 months as previously scheduled  [name], [name], [place], [place] Clinical Pharmacist [employer] at [facility] [phone]

## 2020-10-24 NOTE — Progress Notes (Signed)
Cardiology Office Note   Date:  10/24/2020   ID:  Devin Hanna, DOB July 10, 1950, MRN [id number]  PCP:  [name], MD  Cardiologist:   [name], MD   Chief Complaint  Patient presents with   3 month follow up     Patient c/o LE edema & shortness of breath with over exertion. Medications reviewed by the patient verbally.        History of Present Illness: Devin Hanna is a 70 y.o. male who presents for a followup visit regarding coronary artery disease and chronic systolic heart failure.  He has known CAD with past RCA stents and prior MI, chronic systolic heart failure, peripheral neuropathy, left bundle branch block, prior GI bleed GERD, DM, HTN and HLD.  He was hospitalized in June of 2021 with acute on chronic systolic heart failure with acute kidney injury.  Echocardiogram showed an EF of 25 to 30%.  He underwent a right and left cardiac catheterization in July which showed patent RCA stent with mild in-stent restenosis and moderate disease affecting the left system with moderate calcifications.  Right heart catheterization showed mildly elevated filling pressures and mild pulmonary hypertension with normal cardiac output.  Outpatient renal artery duplex showed no evidence of renal artery stenosis.  He was started on [name]. Hydralazine was added for blood pressure control.    Repeat echocardiogram in January 2022 showed an EF of 30 to 35%.  He underwent biventricular ICD in February by Dr. Quentin Ore.    Recently, he had issues with symptomatic hypotension and thus I elected to decrease the dose of furosemide to 20 mg daily and cut down the dose of Entresto by half.  Blood pressure improved since then and he feels better.  However, he had a recent inappropriate ICD shock that was felt to be due to atrial fibrillation.  He was seen by Dr. Quentin Ore yesterday and was started on mexiletine.  The dose of carvedilol was increased to 12.5 mg twice daily.  No anticoagulation  given liver cirrhosis, active malignancy and increased risk of bleeding. The patient is dealing with liver masses suspected to be cancer.  He will be having radiation beads placed in the near future. Over the last month, he gained about 5 pounds with increased swelling in the legs since the dose of Lasix was decreased.   Past Medical History:  Diagnosis Date   Acute respiratory failure with hypoxia ([place]) 06/2016   CAD (coronary artery disease)    a. Remote PCI of the RCA;  b. 03/2013 Inf STEMI/PCI: LAD 60-70d, RCA 99p ISR(rota/CBA/DES);  c. 09/2014 MV: inf and infsept infarct, EF 35-40%;  d. 09/2014 Cath: patent RCA stent, stable LAD dzs, EF 40%-->Med rx.   CHF (congestive heart failure) ([place])    Diabetes mellitus    sees Dr. Hardin Negus in [place]   GERD (gastroesophageal reflux disease)    HFrEF (heart failure with reduced ejection fraction) ([place])    Hyperlipidemia    Hypertension    Ischemic cardiomyopathy    a. 09/2014 EF 40% by V gram;  b. 06/2016 Echo: EF 40, diff HK, Gr1 DD, inf HK, no mural thrombus. Mild MR, mildly dil LA, mildly dil RV.    LBBB (left bundle branch block)    Old   Liver mass    Morbid obesity ([facility])    Peripheral neuropathy    Upper GI bleed    Vertigo     Past Surgical History:  Procedure Laterality Date  BIV ICD INSERTION CRT-D N/A 04/08/2020   Procedure: BIV ICD INSERTION CRT-D;  Surgeon: Vickie Epley, MD;  Location: [place] [facility];  Service: Cardiovascular;  Laterality: N/A;   CARDIAC CATHETERIZATION N/A 10/03/2014   Procedure: Left Heart Cath and Coronary Angiography;  Surgeon: [name], MD;  Location: [place] [facility];  Service: Cardiovascular;  Laterality: N/A;   CIRCUMCISION N/A 07/22/2015   Procedure: CIRCUMCISION ADULT;  Surgeon: Hollice Espy, MD;  Location: [facility];  Service: Urology;  Laterality: N/A;   COLONOSCOPY WITH PROPOFOL N/A 01/12/2019   Procedure: COLONOSCOPY WITH PROPOFOL;  Surgeon: Lucilla Lame, MD;  Location: [facility]  ENDOSCOPY;  Service: Endoscopy;  Laterality: N/A;   CORONARY STENT PLACEMENT     ESOPHAGOGASTRODUODENOSCOPY (EGD) WITH PROPOFOL N/A 09/12/2014   Procedure: ESOPHAGOGASTRODUODENOSCOPY (EGD) WITH PROPOFOL;  Surgeon: Ronald Lobo, MD;  Location: [facility] ENDOSCOPY;  Service: Endoscopy;  Laterality: N/A;   EXTERNAL FIXATION LEG Left 04/03/2012   Procedure: EXTERNAL FIXATION LEG;  Surgeon: Wylene Simmer, MD;  Location: [place];  Service: Orthopedics;  Laterality: Left;   EXTERNAL FIXATION REMOVAL Left 04/14/2012   Procedure: REMOVAL EXTERNAL FIXATION LEG;  Surgeon: Wylene Simmer, MD;  Location: [place];  Service: Orthopedics;  Laterality: Left;   IR 3D INDEPENDENT WKST  10/16/2020   IR ANGIOGRAM SELECTIVE EACH ADDITIONAL VESSEL  10/16/2020   IR ANGIOGRAM VISCERAL SELECTIVE  10/16/2020   IR EMBO ARTERIAL NOT HEMORR HEMANG INC GUIDE ROADMAPPING  10/16/2020   IR RADIOLOGIST EVAL & MGMT  09/06/2020   IR US GUIDE VASC ACCESS RIGHT  10/16/2020   IRRIGATION AND DEBRIDEMENT KNEE Left 04/03/2012   Procedure: IRRIGATION AND DEBRIDEMENT KNEE;  Surgeon: Wylene Simmer, MD;  Location: [place];  Service: Orthopedics;  Laterality: Left;   LEFT HEART CATHETERIZATION WITH CORONARY ANGIOGRAM N/A 04/05/2013   Procedure: LEFT HEART CATHETERIZATION WITH CORONARY ANGIOGRAM;  Surgeon: [name], MD;  Location: [place] CATH LAB;  Service: Cardiovascular;  Laterality: N/A;   MASS EXCISION N/A 07/22/2015   Procedure: EXCISION PENILE MASS;  Surgeon: Hollice Espy, MD;  Location: [facility] ORS;  Service: Urology;  Laterality: N/A;   ORIF TIBIA PLATEAU Left 04/14/2012   Procedure: OPEN REDUCTION INTERNAL FIXATION (ORIF) TIBIAL PLATEAU;  Surgeon: Wylene Simmer, MD;  Location: [place];  Service: Orthopedics;  Laterality: Left;   PERCUTANEOUS CORONARY ROTOBLATOR INTERVENTION (PCI-R) N/A 04/06/2013   Procedure: PERCUTANEOUS CORONARY ROTOBLATOR INTERVENTION (PCI-R);  Surgeon: [name], MD;  Location: [facility] CATH LAB;  Service: Cardiovascular;  Laterality: N/A;    RIGHT/LEFT HEART CATH AND CORONARY ANGIOGRAPHY N/A 08/21/2019   Procedure: RIGHT/LEFT HEART CATH AND CORONARY ANGIOGRAPHY;  Surgeon: [name], MD;  Location: [place] [facility];  Service: Cardiovascular;  Laterality: N/A;     Current Outpatient Medications  Medication Sig Dispense Refill   atorvastatin (LIPITOR) 40 MG tablet TAKE ONE TABLET BY MOUTH EVERY DAY 90 tablet 0   carvedilol (COREG) 12.5 MG tablet Take 1 tablet (12.5 mg total) by mouth 2 (two) times daily. 180 tablet 3   CINNAMON PO Take 1,000 mg by mouth in the morning and at bedtime.     clopidogrel (PLAVIX) 75 MG tablet TAKE ONE TABLET EVERY MORNING WITH BREAKFAST 90 tablet 3   dapagliflozin propanediol ([name]) 10 MG TABS tablet Take 1 tablet (10 mg total) by mouth daily before breakfast. 30 tablet 3   Dulaglutide ([name]) 3 WJ/1.9JY SOPN Inject 3 mg as directed once a week. 6 mL 0   ferrous sulfate 325 (65 FE)  MG tablet Take 162.5 mg by mouth daily with breakfast.     furosemide (LASIX) 40 MG tablet Take 20 mg by mouth daily.     gabapentin (NEURONTIN) 300 MG capsule Take 1 capsule (300 mg total) by mouth every morning AND 1 capsule (300 mg total) daily with lunch AND 2 capsules (600 mg total) at bedtime. 360 capsule 3   mexiletine (MEXITIL) 150 MG capsule Take 1 capsule (150 mg total) by mouth 2 (two) times daily. 180 capsule 1   Multiple Vitamin (MULTIVITAMIN WITH MINERALS) TABS Take 1 tablet by mouth daily. Multivitamin for Men 50+     nitroGLYCERIN (NITROSTAT) 0.4 MG SL tablet Place 1 tablet (0.4 mg total) under the tongue every 5 (five) minutes as needed for chest pain. 25 tablet 0   pantoprazole (PROTONIX) 40 MG tablet TAKE 1 TABLET BY MOUTH TWICE DAILY. 180 tablet 3   sacubitril-valsartan (ENTRESTO) 49-51 MG Take 0.5 tablets by mouth 2 (two) times daily.     No current facility-administered medications for this visit.    Allergies:   Patient has no known allergies.    Social History:  The patient   reports that he quit smoking about 25 years ago. His smoking use included cigarettes. He has a 120.00 pack-year smoking history. He has quit using smokeless tobacco. He reports that he does not drink alcohol and does not use drugs.   Family History:  The patient's family history includes Breast cancer in his sister; Coronary artery disease in his brother and father; Lung cancer in his brother; Prostate cancer in his brother; Stroke in his sister.    ROS:  Please see the history of present illness.   Otherwise, review of systems are positive for none.   All other systems are reviewed and negative.    PHYSICAL EXAM: VS:  BP 122/70 (BP Location: Left Arm, Patient Position: Sitting, Cuff Size: Normal)   Pulse 72   Ht 5' 10"  (1.778 m)   Wt 239 lb 2 oz (108.5 kg)   SpO2 97%   BMI 34.31 kg/m  , BMI Body mass index is 34.31 kg/m. GEN: Well nourished, well developed, in no acute distress  HEENT: normal  Neck:no JVD, no carotid bruits, or masses Cardiac: RRR; no  rubs, or gallops. . 2/6 systolic ejection murmur in the aortic area which is early peaking .  Mild to moderate bilateral leg edema Respiratory:  clear to auscultation bilaterally, normal work of breathing GI: soft, nontender, nondistended, + BS MS: no deformity or atrophy  Skin: warm and dry, no rash Neuro:  Strength and sensation are intact Psych: euthymic mood, full affect   EKG:  EKG is not ordered today.   Recent Labs: 07/05/2020: TSH 3.420 10/16/2020: ALT 33; BUN 16; Creatinine, Ser 1.29; Hemoglobin 15.0; Platelets 154; Potassium 4.0; Sodium 137    Lipid Panel    Component Value Date/Time   CHOL 103 06/26/2020 0816   CHOL 103 06/07/2014 0824   TRIG 159.0 (H) 06/26/2020 0816   HDL 34.50 (L) 06/26/2020 0816   HDL 38 (L) 06/07/2014 0824   CHOLHDL 3 06/26/2020 0816   VLDL 31.8 06/26/2020 0816   LDLCALC 37 06/26/2020 0816   LDLCALC 59 03/30/2018 0930      Wt Readings from Last 3 Encounters:  10/24/20 239 lb 2 oz  (108.5 kg)  10/23/20 236 lb (107 kg)  10/04/20 235 lb 9.6 oz (106.9 kg)        ASSESSMENT AND PLAN:  1.  Coronary  artery disease involving native coronary arteries without angina: He is doing reasonably well with no anginal symptoms. Continue medical therapy.  He is on Plavix without aspirin given previous history of GI bleed and ulcers.  In addition, he is dealing with liver masses and liver cirrhosis.  If we decide to anticoagulate for atrial fibrillation, clopidogrel was likely be stopped.  2. Chronic systolic heart failure: The patient appears to be mildly overloaded and gained 5 pounds since the dose of furosemide was decreased by half.  I elected to increase furosemide back to 40 mg daily.  Continue carvedilol, Entresto and [name].    3. Essential hypertension: Blood pressure is well controlled.  Hypotension resolved after decreasing Entresto.  4. Hyperlipidemia: Most recent lipid profile showed an LDL of 44.  Thus, we will continue current dose of atorvastatin.  5.  Type 2 diabetes.  Diabetes control has improved.  I reviewed his labs which showed improvement of hemoglobin A1c from 7.1 to 6.3.  6.  Status post biventricular ICD placement: Followed by Dr. Quentin Ore.  7.  Atrial fibrillation: Noted on device interrogation and likely responsible for recent inappropriate ICD shock.  The patient was started on mexiletine and the dose of carvedilol was increased.  Once he is done with treatment of liver cancer, we should consider starting anticoagulation and stopping clopidogrel.   Disposition:   FU with me in 4 months  Signed,  [name], MD  10/24/2020 8:14 AM    [place]

## 2020-10-24 NOTE — Patient Instructions (Signed)
Medication Instructions:  Your physician has recommended you make the following change in your medication:   INCREASE Lasix to 40 mg daily. An Rx has been sent to your pharmacy.  *If you need a refill on your cardiac medications before your next appointment, please call your pharmacy*   Lab Work: None ordered  If you have labs (blood work) drawn today and your tests are completely normal, you will receive your results only by: [place] (if you have MyChart) OR A paper copy in the mail If you have any lab test that is abnormal or we need to change your treatment, we will call you to review the results.   Testing/Procedures: None ordered   Follow-Up: At [facility], you and your health needs are our priority.  As part of our continuing mission to provide you with exceptional heart care, we have created designated Provider Care Teams.  These Care Teams include your primary Cardiologist (physician) and Advanced Practice Providers (APPs -  Physician Assistants and Nurse Practitioners) who all work together to provide you with the care you need, when you need it.  We recommend signing up for the patient portal called "MyChart".  Sign up information is provided on this After Visit Summary.  MyChart is used to connect with patients for Virtual Visits (Telemedicine).  Patients are able to view lab/test results, encounter notes, upcoming appointments, etc.  Non-urgent messages can be sent to your provider as well.   To learn more about what you can do with MyChart, go to [email or web address].    Your next appointment:   4 month(s)  The format for your next appointment:   In Person  Provider:   You may see [name], MD or one of the following Advanced Practice Providers on your designated Care Team:   Murray Hodgkins, NP Christell Faith, PA-C Marrianne Mood, PA-C Cadence Kathlen Mody, [place]   Other Instructions N/A

## 2020-10-24 NOTE — Chronic Care Management (AMB) (Signed)
Chronic Care Management Pharmacy Note  10/24/2020 Name:  Devin Hanna MRN:  [id number] DOB:  November 09, 1950   Subjective: Devin Hanna is an 70 y.o. year old male who is a primary patient of [name], Angela Adam, MD.  The CCM team was consulted for assistance with disease management and care coordination needs.    Engaged with patient's daughter, [name]  by telephone for  medication access  in response to provider referral for pharmacy case management and/or care coordination services.   Consent to Services:  The patient was given information about Chronic Care Management services, agreed to services, and gave verbal consent prior to initiation of services.  Please see initial visit note for detailed documentation.   Patient Care Team: [name], MD as PCP - General (Family Medicine) [name], MD as PCP - Cardiology (Cardiology) Vickie Epley, MD as PCP - Electrophysiology (Cardiology) Clent Jacks, RN as Oncology Nurse Navigator [name], RPH-CPP (Pharmacist)   Objective:  Lab Results  Component Value Date   CREATININE 1.22 10/23/2020   CREATININE 1.29 (H) 10/16/2020   CREATININE 1.47 10/04/2020    Lab Results  Component Value Date   HGBA1C 6.4 10/04/2020   Last diabetic Eye exam: No results found for: HMDIABEYEEXA  Last diabetic Foot exam: No results found for: HMDIABFOOTEX      Component Value Date/Time   CHOL 103 06/26/2020 0816   CHOL 103 06/07/2014 0824   TRIG 159.0 (H) 06/26/2020 0816   HDL 34.50 (L) 06/26/2020 0816   HDL 38 (L) 06/07/2014 0824   CHOLHDL 3 06/26/2020 0816   VLDL 31.8 06/26/2020 0816   LDLCALC 37 06/26/2020 0816   LDLCALC 59 03/30/2018 0930    Hepatic Function Latest Ref Rng & Units 10/16/2020 08/21/2020 07/29/2020  Total Protein 6.5 - 8.1 g/dL 6.2(L) 7.1 -  Albumin 3.5 - 5.0 g/dL 3.4(L) 3.6 -  AST 15 - 41 U/L 48(H) 33 -  ALT 0 - 44 U/L 33 25 -  Alk Phosphatase 38 - 126 U/L 125 116 153(H)  Total  Bilirubin 0.3 - 1.2 mg/dL 1.0 1.0 -  Bilirubin, Direct - - - -    Lab Results  Component Value Date/Time   TSH 3.420 07/05/2020 03:03 PM   TSH 0.889 09/09/2014 03:05 AM   TSH 3.056 04/04/2013 10:20 AM    CBC Latest Ref Rng & Units 10/16/2020 08/21/2020 07/05/2020  WBC 4.0 - 10.5 K/uL 5.8 6.8 6.9  Hemoglobin 13.0 - 17.0 g/dL 15.0 14.8 14.8  Hematocrit 39.0 - 52.0 % 44.6 43.1 43.0  Platelets 150 - 400 K/uL 154 162 201    No results found for: VD25OH  Clinical ASCVD: No  The ASCVD Risk score (Arnett DK, et al., 2019) failed to calculate for the following reasons:   The valid total cholesterol range is 130 to 320 mg/dL    Social History   Tobacco Use  Smoking Status Former   Packs/day: 3.00   Years: 40.00   Pack years: 120.00   Types: Cigarettes   Quit date: 04/05/1995   Years since quitting: 25.5  Smokeless Tobacco Former  Tobacco Comments   started smoking at age 33   BP Readings from Last 3 Encounters:  10/24/20 122/70  10/23/20 130/78  10/16/20 (!) 161/81   Pulse Readings from Last 3 Encounters:  10/24/20 72  10/23/20 70  10/16/20 70   Wt Readings from Last 3 Encounters:  10/24/20 239 lb 2 oz (108.5 kg)  10/23/20 236 lb (107 kg)  10/04/20 235 lb 9.6 oz (106.9 kg)    Assessment: Review of patient past medical history, allergies, medications, health status, including review of consultants reports, laboratory and other test data, was performed as part of comprehensive evaluation and provision of chronic care management services.   SDOH:  (Social Determinants of Health) assessments and interventions performed:  SDOH Interventions    Flowsheet Row Most Recent Value  SDOH Interventions   Financial Strain Interventions Other (Comment)  [manufacturer assistance]       CCM Care Plan  No Known Allergies  Medications Reviewed Today     Reviewed by Anselm Pancoast, CMA (Certified Medical Assistant) on 10/24/20 at 0758  Med List Status: <None>   Medication  Order Taking? Sig Documenting Provider Last Dose Status Informant  atorvastatin (LIPITOR) 40 MG tablet [id number] Yes TAKE ONE TABLET BY MOUTH EVERY DAY Theora Gianotti, NP Taking Active   carvedilol (COREG) 12.5 MG tablet [id number] Yes Take 1 tablet (12.5 mg total) by mouth 2 (two) times daily. Vickie Epley, MD Taking Active   CINNAMON PO [id number] Yes Take 1,000 mg by mouth in the morning and at bedtime. [provider] Taking Active Child  clopidogrel (PLAVIX) 75 MG tablet [id number] Yes TAKE ONE TABLET EVERY MORNING WITH BREAKFAST [name], MD Taking Active   dapagliflozin propanediol ([name]) 10 MG TABS tablet [id number] Yes Take 1 tablet (10 mg total) by mouth daily before breakfast. [name], MD Taking Active   Dulaglutide ([name]) 3 SH/7.72YO SOPN [id number] Yes Inject 3 mg as directed once a week. [name], MD Taking Active Child  ferrous sulfate 325 (65 FE) MG tablet [id number] Yes Take 162.5 mg by mouth daily with breakfast. [provider] Taking Active   furosemide (LASIX) 40 MG tablet [id number] Yes Take 20 mg by mouth daily. [provider] Taking Active   gabapentin (NEURONTIN) 300 MG capsule [id number] Yes Take 1 capsule (300 mg total) by mouth every morning AND 1 capsule (300 mg total) daily with lunch AND 2 capsules (600 mg total) at bedtime. [name], MD Taking Active   mexiletine (MEXITIL) 150 MG capsule [id number] Yes Take 1 capsule (150 mg total) by mouth 2 (two) times daily. Vickie Epley, MD Taking Active   Multiple Vitamin (MULTIVITAMIN WITH MINERALS) TABS [id number] Yes Take 1 tablet by mouth daily. Multivitamin for Men 50+ [provider] Taking Active Child  nitroGLYCERIN (NITROSTAT) 0.4 MG SL tablet [id number] Yes Place 1 tablet (0.4 mg total) under the tongue every 5 (five) minutes as needed for chest pain. [name], MD Taking Active            Med Note Tawanna Solo  Oct 16, 2020  7:25 AM) Has medication, has never taken  pantoprazole (PROTONIX) 40 MG tablet [id number] Yes TAKE 1 TABLET BY MOUTH TWICE DAILY. [name], MD Taking Active   sacubitril-valsartan (ENTRESTO) 49-51 MG [id number] Yes Take 0.5 tablets by mouth 2 (two) times daily. [provider] Taking Active             Patient Active Problem List   Diagnosis Date Noted   Persistent atrial fibrillation ([place]) 09/05/2020   Secondary hypercoagulable state ([place]) 09/05/2020   Cough 01/11/2020   Left knee pain 01/11/2020   Abnormal cardiovascular stress test    Acute on chronic systolic heart failure ([place]) 07/30/2019   AK (actinic keratosis) 04/03/2019   Colon cancer  screening    Rectal polyp    Polyp of transverse colon    Liver lesion 12/06/2018   Chronic kidney disease (CKD), stage III (moderate) (HCC) 12/06/2018   Abrasion 12/06/2018   History of anemia 03/30/2018   Hyperpigmented skin lesion 12/20/2017   Bilateral foot pain 12/20/2017   DDD (degenerative disc disease), lumbar 08/18/2017   SCC (squamous cell carcinoma), trunk 05/17/2017   Venous insufficiency 02/12/2017   Decreased pedal pulses 02/12/2017   Skin lesion 02/12/2017   Neuropathy 01/09/2016   Elevated LFTs 06/28/2015   History of penile cancer 06/28/2015   Peptic ulcer disease 09/12/2014   Erectile dysfunction 65/46/5035   Chronic systolic heart failure ([place]) 05/15/2013   Hyperlipidemia 05/15/2013   DM type 2 (diabetes mellitus, type 2) ([place]) 04/07/2013   Other and unspecified hyperlipidemia 04/07/2013   Morbid obesity ([place]) 04/07/2013   HTN (hypertension) 04/07/2013   CAD (coronary artery disease) 04/04/2013    Immunization History  Administered Date(s) Administered   Fluad Quad(high Dose 65+) 10/26/2018, 01/11/2020   Influenza, High Dose Seasonal PF 01/09/2016, 10/27/2016, 12/20/2017   Pneumococcal Conjugate-13 01/09/2016   Pneumococcal Polysaccharide-23 09/09/2014   Tdap 01/21/2018     Conditions to be addressed/monitored: CHF, HTN, HLD, and DMII  Care Plan : Medication Management  Updates made by [name], RPH-CPP since 10/24/2020 12:00 AM     Problem: Diabetes, CHF, HTN      Long-Range Goal: Disease Progression Prevention   Start Date: 08/08/2020  This Visit's Progress: On track  Recent Progress: On track  Priority: High  Note:   Current Barriers:  Unable to independently afford treatment regimen Unable to achieve control of diabetes  Complex patient with multiple comorbidities including HFrEF, CAD, HLD  Pharmacist Clinical Goal(s):  Over the next 90 days, patient will verbalize ability to afford treatment regimen. Over the next 90 days, patient will achieve adherence to monitoring guidelines and medication adherence to achieve therapeutic efficacy. Over the next 90 days, patient will achieve control of diabetes as evidenced by improvement in A1c  Interventions: 1:1 collaboration with [name], MD regarding development and update of comprehensive plan of care as evidenced by provider attestation and co-signature Inter-disciplinary care team collaboration (see longitudinal plan of care) Comprehensive medication review performed; medication list updated in electronic medical record   Diabetes: Controlled, relaxed goal of A1c <7.5% likely appropriate given age, comorbidities; current treatment: [name] 3 mg weekly; [name] 10 mg daily Metformin d/c d/t renal function  Approved for patient assistance for [name], [name] through 02/08/21.  E-script for Wilder Glade was not received as [employer] just updated their system. Will print and fax to update fax number [phone]  HFrEF, (most recent EF 30-35%); s/p CRT-D placement 2/28 Appropriately managed; current treatment: furosemide 20 mg daily, carvedilol 6.25 mg BID, Entresto 49/51 mg BID; follows w/ Dr. Fletcher Anon, Dr. Cleotilde Neer through [employer] assistance through  2022. Previously recommended to continue current regimen at this time.   Hyperlipidemia, secondary ASCVD prevention (hx MI w/ RCA stenting) Controlled per last lipid panel; current treatment: atorvastatin 40 mg daily Antiplatelet regimen: clopidogrel 75 mg daily Previously recommended to continue current regimen at this time.   GERD/hx peptic ulcer disease: Appropriately managed and controlled per patient report; current treatment: pantoprazole 40 mg BID Previously recommended to continue current regimen at this time. PPI use appropriate given PUD.  Supplement: Cinnamon  Patient Goals/Self-Care Activities Over the next 90 days, patient will:  - take medications as prescribed check glucose 3-4 times  weekly, document, and provide at future appointments check blood pressure twice daily, document, and provide at future appointments weigh daily, and contact provider if weight gain of greater than 3 lbs in a day or greater than 5 lbs in a week collaborate with provider on medication access solutions  Follow Up Plan: Telephone follow up appointment with care management team member scheduled for: ~ 2 months as previously scheduled       Medication Assistance: [name], Wilder Glade obtained through [name] medication assistance program.  Enrollment ends 02/08/21  Patient's preferred pharmacy is:  [facility], [place] - [address] [place] [address] Phone: [phone] Fax: [phone]  [facility] - [place], [place] [place]. [place] [address] Phone: [phone] Fax: [phone]  [facility], [place] [address] [place] [address] Phone: [phone] Fax: [phone]  [facility] [place], [place] - [address] [place] [address] Phone: [phone] Fax:  [phone]   Follow Up:  Patient agrees to Care Plan and Follow-up.  Plan: Telephone follow up appointment with care management team member scheduled for:  2 months as previously scheduled  [name], [name], [place], [place] Clinical Pharmacist [employer] at [facility] [phone]

## 2020-10-29 ENCOUNTER — Other Ambulatory Visit: Payer: Self-pay | Admitting: Radiology

## 2020-10-29 ENCOUNTER — Ambulatory Visit: Payer: PPO | Admitting: Pharmacist

## 2020-10-29 ENCOUNTER — Other Ambulatory Visit (HOSPITAL_COMMUNITY): Payer: Self-pay | Admitting: Physician Assistant

## 2020-10-29 DIAGNOSIS — I25118 Atherosclerotic heart disease of native coronary artery with other forms of angina pectoris: Secondary | ICD-10-CM

## 2020-10-29 DIAGNOSIS — E1142 Type 2 diabetes mellitus with diabetic polyneuropathy: Secondary | ICD-10-CM

## 2020-10-29 DIAGNOSIS — I10 Essential (primary) hypertension: Secondary | ICD-10-CM

## 2020-10-29 DIAGNOSIS — I5022 Chronic systolic (congestive) heart failure: Secondary | ICD-10-CM

## 2020-10-29 NOTE — Patient Instructions (Signed)
Visit Information  PATIENT GOALS:  Goals Addressed               This Visit's Progress     Patient Stated     Medication Monitoring (pt-stated)        Patient Goals/Self-Care Activities Over the next 90 days, patient will:  - take medications as prescribed check glucose 3-5 times weekly, document, and provide at future appointments check blood pressure 3-5 times weekly, document, and provide at future appointments weigh daily, and contact provider if weight gain of greater than 3 lbs in a day or greater than 5 lbs in a week collaborate with provider on medication access solutions        Patient verbalizes understanding of instructions provided today and agrees to view in [place].   Plan: Telephone follow up appointment with care management team member scheduled for:  ~ 2 months  [name], [name], [place], [place] Clinical Pharmacist [employer] at [employer] [phone]

## 2020-10-29 NOTE — Chronic Care Management (AMB) (Signed)
Chronic Care Management Pharmacy Note  10/29/2020 Name:  RYKIN ROUTE MRN:  [id number] DOB:  11-04-1950   Subjective: Devin Hanna is an 70 y.o. year old male who is a primary patient of [name], Angela Adam, MD.  The CCM team was consulted for assistance with disease management and care coordination needs.    Care coordination  for  medication management  in response to provider referral for pharmacy case management and/or care coordination services.   Consent to Services:  The patient was given information about Chronic Care Management services, agreed to services, and gave verbal consent prior to initiation of services.  Please see initial visit note for detailed documentation.   Patient Care Team: [name], MD as PCP - General (Family Medicine) [name], MD as PCP - Cardiology (Cardiology) Vickie Epley, MD as PCP - Electrophysiology (Cardiology) Clent Jacks, RN as Oncology Nurse Navigator [name], RPH-CPP (Pharmacist)   Objective:  Lab Results  Component Value Date   CREATININE 1.22 10/23/2020   CREATININE 1.29 (H) 10/16/2020   CREATININE 1.47 10/04/2020    Lab Results  Component Value Date   HGBA1C 6.4 10/04/2020   Last diabetic Eye exam: No results found for: HMDIABEYEEXA  Last diabetic Foot exam: No results found for: HMDIABFOOTEX      Component Value Date/Time   CHOL 103 06/26/2020 0816   CHOL 103 06/07/2014 0824   TRIG 159.0 (H) 06/26/2020 0816   HDL 34.50 (L) 06/26/2020 0816   HDL 38 (L) 06/07/2014 0824   CHOLHDL 3 06/26/2020 0816   VLDL 31.8 06/26/2020 0816   LDLCALC 37 06/26/2020 0816   LDLCALC 59 03/30/2018 0930    Hepatic Function Latest Ref Rng & Units 10/16/2020 08/21/2020 07/29/2020  Total Protein 6.5 - 8.1 g/dL 6.2(L) 7.1 -  Albumin 3.5 - 5.0 g/dL 3.4(L) 3.6 -  AST 15 - 41 U/L 48(H) 33 -  ALT 0 - 44 U/L 33 25 -  Alk Phosphatase 38 - 126 U/L 125 116 153(H)  Total Bilirubin 0.3 - 1.2 mg/dL 1.0 1.0 -   Bilirubin, Direct - - - -    Lab Results  Component Value Date/Time   TSH 3.420 07/05/2020 03:03 PM   TSH 0.889 09/09/2014 03:05 AM   TSH 3.056 04/04/2013 10:20 AM    CBC Latest Ref Rng & Units 10/16/2020 08/21/2020 07/05/2020  WBC 4.0 - 10.5 K/uL 5.8 6.8 6.9  Hemoglobin 13.0 - 17.0 g/dL 15.0 14.8 14.8  Hematocrit 39.0 - 52.0 % 44.6 43.1 43.0  Platelets 150 - 400 K/uL 154 162 201      Social History   Tobacco Use  Smoking Status Former   Packs/day: 3.00   Years: 40.00   Pack years: 120.00   Types: Cigarettes   Quit date: 04/05/1995   Years since quitting: 25.5  Smokeless Tobacco Former  Tobacco Comments   started smoking at age 33   BP Readings from Last 3 Encounters:  10/24/20 122/70  10/23/20 130/78  10/16/20 (!) 161/81   Pulse Readings from Last 3 Encounters:  10/24/20 72  10/23/20 70  10/16/20 70   Wt Readings from Last 3 Encounters:  10/24/20 239 lb 2 oz (108.5 kg)  10/23/20 236 lb (107 kg)  10/04/20 235 lb 9.6 oz (106.9 kg)    Assessment: Review of patient past medical history, allergies, medications, health status, including review of consultants reports, laboratory and other test data, was performed as part of comprehensive evaluation  and provision of chronic care management services.   SDOH:  (Social Determinants of Health) assessments and interventions performed:  SDOH Interventions    Flowsheet Row Most Recent Value  SDOH Interventions   Financial Strain Interventions Other (Comment)  [manufacturer assistance]       CCM Care Plan  No Known Allergies  Medications Reviewed Today     Reviewed by Anselm Pancoast, CMA (Certified Medical Assistant) on 10/24/20 at 0758  Med List Status: <None>   Medication Order Taking? Sig Documenting Provider Last Dose Status Informant  atorvastatin (LIPITOR) 40 MG tablet [id number] Yes TAKE ONE TABLET BY MOUTH EVERY DAY Theora Gianotti, NP Taking Active   carvedilol (COREG) 12.5 MG tablet [id number]  Yes Take 1 tablet (12.5 mg total) by mouth 2 (two) times daily. Vickie Epley, MD Taking Active   CINNAMON PO [id number] Yes Take 1,000 mg by mouth in the morning and at bedtime. [provider] Taking Active Child  clopidogrel (PLAVIX) 75 MG tablet [id number] Yes TAKE ONE TABLET EVERY MORNING WITH BREAKFAST [name], MD Taking Active   dapagliflozin propanediol ([name]) 10 MG TABS tablet [id number] Yes Take 1 tablet (10 mg total) by mouth daily before breakfast. [name], MD Taking Active   Dulaglutide ([name]) 3 IW/9.7LG SOPN [id number] Yes Inject 3 mg as directed once a week. [name], MD Taking Active Child  ferrous sulfate 325 (65 FE) MG tablet [id number] Yes Take 162.5 mg by mouth daily with breakfast. [provider] Taking Active   furosemide (LASIX) 40 MG tablet [id number] Yes Take 20 mg by mouth daily. [provider] Taking Active   gabapentin (NEURONTIN) 300 MG capsule [id number] Yes Take 1 capsule (300 mg total) by mouth every morning AND 1 capsule (300 mg total) daily with lunch AND 2 capsules (600 mg total) at bedtime. [name], MD Taking Active   mexiletine (MEXITIL) 150 MG capsule [id number] Yes Take 1 capsule (150 mg total) by mouth 2 (two) times daily. Vickie Epley, MD Taking Active   Multiple Vitamin (MULTIVITAMIN WITH MINERALS) TABS [id number] Yes Take 1 tablet by mouth daily. Multivitamin for Men 50+ [provider] Taking Active Child  nitroGLYCERIN (NITROSTAT) 0.4 MG SL tablet [id number] Yes Place 1 tablet (0.4 mg total) under the tongue every 5 (five) minutes as needed for chest pain. [name], MD Taking Active            Med Note Tawanna Solo Oct 16, 2020  7:25 AM) Has medication, has never taken  pantoprazole (PROTONIX) 40 MG tablet [id number] Yes TAKE 1 TABLET BY MOUTH TWICE DAILY. [name], MD Taking Active   sacubitril-valsartan (ENTRESTO) 49-51 MG [id number] Yes Take  0.5 tablets by mouth 2 (two) times daily. [provider] Taking Active             Patient Active Problem List   Diagnosis Date Noted   Persistent atrial fibrillation ([place]) 09/05/2020   Secondary hypercoagulable state ([place]) 09/05/2020   Cough 01/11/2020   Left knee pain 01/11/2020   Abnormal cardiovascular stress test    Acute on chronic systolic heart failure ([place]) 07/30/2019   AK (actinic keratosis) 04/03/2019   Colon cancer screening    Rectal polyp    Polyp of transverse colon    Liver lesion 12/06/2018   Chronic kidney disease (CKD), stage III (moderate) ([place]) 12/06/2018   Abrasion 12/06/2018   History of anemia 03/30/2018   Hyperpigmented skin  lesion 12/20/2017   Bilateral foot pain 12/20/2017   DDD (degenerative disc disease), lumbar 08/18/2017   SCC (squamous cell carcinoma), trunk 05/17/2017   Venous insufficiency 02/12/2017   Decreased pedal pulses 02/12/2017   Skin lesion 02/12/2017   Neuropathy 01/09/2016   Elevated LFTs 06/28/2015   History of penile cancer 06/28/2015   Peptic ulcer disease 09/12/2014   Erectile dysfunction 23/53/6144   Chronic systolic heart failure ([place]) 05/15/2013   Hyperlipidemia 05/15/2013   DM type 2 (diabetes mellitus, type 2) ([place]) 04/07/2013   Other and unspecified hyperlipidemia 04/07/2013   Morbid obesity ([place]) 04/07/2013   HTN (hypertension) 04/07/2013   CAD (coronary artery disease) 04/04/2013    Immunization History  Administered Date(s) Administered   Fluad Quad(high Dose 65+) 10/26/2018, 01/11/2020   Influenza, High Dose Seasonal PF 01/09/2016, 10/27/2016, 12/20/2017   Pneumococcal Conjugate-13 01/09/2016   Pneumococcal Polysaccharide-23 09/09/2014   Tdap 01/21/2018    Conditions to be addressed/monitored: CHF, HTN, and DMII  Care Plan : Medication Management  Updates made by [name], RPH-CPP since 10/29/2020 12:00 AM     Problem: Diabetes, CHF, HTN      Long-Range Goal: Disease  Progression Prevention   Start Date: 08/08/2020  Recent Progress: On track  Priority: High  Note:   Current Barriers:  Unable to independently afford treatment regimen Unable to achieve control of diabetes  Complex patient with multiple comorbidities including HFrEF, CAD, HLD  Pharmacist Clinical Goal(s):  Over the next 90 days, patient will verbalize ability to afford treatment regimen. Over the next 90 days, patient will achieve adherence to monitoring guidelines and medication adherence to achieve therapeutic efficacy. Over the next 90 days, patient will achieve control of diabetes as evidenced by improvement in A1c  Interventions: 1:1 collaboration with [name], MD regarding development and update of comprehensive plan of care as evidenced by provider attestation and co-signature Inter-disciplinary care team collaboration (see longitudinal plan of care) Comprehensive medication review performed; medication list updated in electronic medical record   Diabetes: Controlled, relaxed goal of A1c <7.5% likely appropriate given age, comorbidities; current treatment: [name] 3 mg weekly; [name] 10 mg daily Metformin d/c d/t renal function  Approved for patient assistance for [name], [name] through 02/08/21.  [place] patient assistance to follow up on status of order for 10 mg tablet. Shipped yesterday - should arrive to patient in 3-5 business days.   HFrEF, (most recent EF 30-35%); s/p CRT-D placement 2/28 Appropriately managed; current treatment: furosemide 20 mg daily, carvedilol 6.25 mg BID, Entresto 49/51 mg BID; follows w/ Dr. Fletcher Anon, Dr. Cleotilde Neer through [employer] assistance through 2022. Previously recommended to continue current regimen at this time.   Hyperlipidemia, secondary ASCVD prevention (hx MI w/ RCA stenting) Controlled per last lipid panel; current treatment: atorvastatin 40 mg daily Antiplatelet regimen: clopidogrel 75 mg  daily Previously recommended to continue current regimen at this time.   GERD/hx peptic ulcer disease: Appropriately managed and controlled per patient report; current treatment: pantoprazole 40 mg BID Previously recommended to continue current regimen at this time. PPI use appropriate given PUD.  Supplement: Cinnamon  Patient Goals/Self-Care Activities Over the next 90 days, patient will:  - take medications as prescribed check glucose 3-4 times weekly, document, and provide at future appointments check blood pressure twice daily, document, and provide at future appointments weigh daily, and contact provider if weight gain of greater than 3 lbs in a day or greater than 5 lbs in a week collaborate with provider  on medication access solutions  Follow Up Plan: Telephone follow up appointment with care management team member scheduled for: ~ 2 months as previously scheduled       Medication Assistance: [name], [name] obtained through [name] medication assistance program.  Enrollment ends 02/08/21  Patient's preferred pharmacy is:  [facility], [place] - [place] [address] Phone: [phone] Fax: [phone]  [facility] - [place], [place]. [place] [address] Phone: [phone] Fax: [phone]  [facility], [place] [address] [place] [address] Phone: [phone] Fax: [phone]  [facility] [place], [place] - [address] [place] [address] Phone: [phone] Fax: [phone]  Follow Up:  Patient agrees to Care Plan and Follow-up.  Plan: Telephone follow up appointment with care management team member scheduled for:  ~ 2 months  [name], [name], [place], [place] Clinical Pharmacist [employer] at [employer] [phone]

## 2020-10-30 ENCOUNTER — Encounter (HOSPITAL_COMMUNITY)
Admission: RE | Admit: 2020-10-30 | Discharge: 2020-10-30 | Disposition: A | Discharge status: Home / Self Care | Payer: PPO | Source: Ambulatory Visit | Attending: Interventional Radiology | Admitting: Interventional Radiology

## 2020-10-30 ENCOUNTER — Other Ambulatory Visit: Payer: Self-pay

## 2020-10-30 ENCOUNTER — Other Ambulatory Visit (HOSPITAL_COMMUNITY): Payer: Self-pay | Admitting: Interventional Radiology

## 2020-10-30 ENCOUNTER — Encounter (HOSPITAL_COMMUNITY): Payer: Self-pay

## 2020-10-30 ENCOUNTER — Ambulatory Visit (HOSPITAL_COMMUNITY)
Admission: RE | Admit: 2020-10-30 | Discharge: 2020-10-30 | Disposition: A | Discharge status: Home / Self Care | Payer: PPO | Source: Ambulatory Visit | Attending: Interventional Radiology | Admitting: Interventional Radiology

## 2020-10-30 DIAGNOSIS — K7581 Nonalcoholic steatohepatitis (NASH): Secondary | ICD-10-CM | POA: Diagnosis not present

## 2020-10-30 DIAGNOSIS — Z131 Encounter for screening for diabetes mellitus: Secondary | ICD-10-CM | POA: Diagnosis not present

## 2020-10-30 DIAGNOSIS — C220 Liver cell carcinoma: Secondary | ICD-10-CM | POA: Insufficient documentation

## 2020-10-30 DIAGNOSIS — Z7902 Long term (current) use of antithrombotics/antiplatelets: Secondary | ICD-10-CM | POA: Insufficient documentation

## 2020-10-30 DIAGNOSIS — Z79899 Other long term (current) drug therapy: Secondary | ICD-10-CM | POA: Insufficient documentation

## 2020-10-30 HISTORY — PX: IR ANGIOGRAM VISCERAL SELECTIVE: IMG657

## 2020-10-30 HISTORY — PX: IR EMBO TUMOR ORGAN ISCHEMIA INFARCT INC GUIDE ROADMAPPING: IMG5449

## 2020-10-30 HISTORY — PX: IR US GUIDE VASC ACCESS RIGHT: IMG2390

## 2020-10-30 HISTORY — PX: IR ANGIOGRAM SELECTIVE EACH ADDITIONAL VESSEL: IMG667

## 2020-10-30 HISTORY — PX: IR 3D INDEPENDENT WKST: IMG2385

## 2020-10-30 LAB — CBC WITH DIFFERENTIAL/PLATELET
Abs Immature Granulocytes: 0.01 10*3/uL (ref 0.00–0.07)
Basophils Absolute: 0 10*3/uL (ref 0.0–0.1)
Basophils Relative: 1 %
Eosinophils Absolute: 0.1 10*3/uL (ref 0.0–0.5)
Eosinophils Relative: 2 %
HCT: 45.9 % (ref 39.0–52.0)
Hemoglobin: 15.2 g/dL (ref 13.0–17.0)
Immature Granulocytes: 0 %
Lymphocytes Relative: 20 %
Lymphs Abs: 1.7 10*3/uL (ref 0.7–4.0)
MCH: 32.6 pg (ref 26.0–34.0)
MCHC: 33.1 g/dL (ref 30.0–36.0)
MCV: 98.5 fL (ref 80.0–100.0)
Monocytes Absolute: 0.7 10*3/uL (ref 0.1–1.0)
Monocytes Relative: 8 %
Neutro Abs: 5.8 10*3/uL (ref 1.7–7.7)
Neutrophils Relative %: 69 %
Platelets: 172 10*3/uL (ref 150–400)
RBC: 4.66 MIL/uL (ref 4.22–5.81)
RDW: 14.3 % (ref 11.5–15.5)
WBC: 8.3 10*3/uL (ref 4.0–10.5)
nRBC: 0 % (ref 0.0–0.2)

## 2020-10-30 LAB — COMPREHENSIVE METABOLIC PANEL
ALT: 36 U/L (ref 0–44)
AST: 45 U/L — ABNORMAL HIGH (ref 15–41)
Albumin: 3.6 g/dL (ref 3.5–5.0)
Alkaline Phosphatase: 137 U/L — ABNORMAL HIGH (ref 38–126)
Anion gap: 14 (ref 5–15)
BUN: 21 mg/dL (ref 8–23)
CO2: 24 mmol/L (ref 22–32)
Calcium: 8.8 mg/dL — ABNORMAL LOW (ref 8.9–10.3)
Chloride: 102 mmol/L (ref 98–111)
Creatinine, Ser: 1.46 mg/dL — ABNORMAL HIGH (ref 0.61–1.24)
GFR, Estimated: 51 mL/min — ABNORMAL LOW (ref >60)
Glucose, Bld: 131 mg/dL — ABNORMAL HIGH (ref 70–99)
Potassium: 3.9 mmol/L (ref 3.5–5.1)
Sodium: 140 mmol/L (ref 135–145)
Total Bilirubin: 1.6 mg/dL — ABNORMAL HIGH (ref 0.3–1.2)
Total Protein: 6.9 g/dL (ref 6.5–8.1)

## 2020-10-30 LAB — GLUCOSE, CAPILLARY: Glucose-Capillary: 133 mg/dL — ABNORMAL HIGH (ref 70–99)

## 2020-10-30 LAB — PROTIME-INR
INR: 1.1 (ref 0.8–1.2)
Prothrombin Time: 14.6 seconds (ref 11.4–15.2)

## 2020-10-30 MED ORDER — LIDOCAINE HCL 1 % IJ SOLN
INTRAMUSCULAR | Status: AC
  Filled 2020-10-30: qty 20

## 2020-10-30 MED ORDER — IOHEXOL 300 MG/ML  SOLN
100.0000 mL | Freq: Once | INTRAMUSCULAR | Status: AC | PRN
  Administered 2020-10-30: 70 mL via INTRA_ARTERIAL

## 2020-10-30 MED ORDER — SODIUM CHLORIDE 0.9 % IV SOLN: INTRAVENOUS | Status: DC

## 2020-10-30 MED ORDER — YTTRIUM 90 INJECTION: 34.1200 | INJECTION | Freq: Once | INTRAVENOUS | Status: DC

## 2020-10-30 MED ORDER — LIDOCAINE HCL 1 % IJ SOLN
INTRAMUSCULAR | Status: DC | PRN
  Administered 2020-10-30: 5 mL

## 2020-10-30 MED ORDER — HYDROCODONE-ACETAMINOPHEN 5-325 MG PO TABS: 1.0000 | ORAL_TABLET | ORAL | Status: DC | PRN

## 2020-10-30 MED ORDER — IOHEXOL 300 MG/ML  SOLN
100.0000 mL | Freq: Once | INTRAMUSCULAR | Status: AC | PRN
  Administered 2020-10-30: 20 mL via INTRA_ARTERIAL

## 2020-10-30 MED ORDER — MIDAZOLAM HCL 2 MG/2ML IJ SOLN
INTRAMUSCULAR | Status: DC | PRN
Start: 2020-10-30 — End: 2020-10-31
  Administered 2020-10-30: 1 mg via INTRAVENOUS

## 2020-10-30 MED ORDER — FENTANYL CITRATE (PF) 100 MCG/2ML IJ SOLN
INTRAMUSCULAR | Status: AC
  Filled 2020-10-30: qty 2

## 2020-10-30 MED ORDER — FENTANYL CITRATE (PF) 100 MCG/2ML IJ SOLN
INTRAMUSCULAR | Status: DC | PRN
  Administered 2020-10-30: 50 ug via INTRAVENOUS

## 2020-10-30 MED ORDER — LEVOFLOXACIN 500 MG PO TABS: 500.0000 mg | ORAL_TABLET | Freq: Every day | ORAL | 0 refills | Status: DC

## 2020-10-30 MED ORDER — SODIUM CHLORIDE 0.9 % IV SOLN
8.0000 mg | Freq: Once | INTRAVENOUS | Status: AC
  Administered 2020-10-30: 8 mg via INTRAVENOUS
  Filled 2020-10-30: qty 4

## 2020-10-30 MED ORDER — SODIUM CHLORIDE 0.9 % IV SOLN
2.0000 g | Freq: Once | INTRAVENOUS | Status: AC
  Administered 2020-10-30: 2 g via INTRAVENOUS
  Filled 2020-10-30 (×2): qty 2

## 2020-10-30 MED ORDER — PANTOPRAZOLE SODIUM 40 MG IV SOLR
40.0000 mg | Freq: Once | INTRAVENOUS | Status: AC
  Administered 2020-10-30: 40 mg via INTRAVENOUS
  Filled 2020-10-30: qty 40

## 2020-10-30 MED ORDER — DEXAMETHASONE SODIUM PHOSPHATE 10 MG/ML IJ SOLN
8.0000 mg | Freq: Once | INTRAMUSCULAR | Status: AC
  Administered 2020-10-30: 8 mg via INTRAVENOUS
  Filled 2020-10-30: qty 1

## 2020-10-30 MED ORDER — MIDAZOLAM HCL 2 MG/2ML IJ SOLN
INTRAMUSCULAR | Status: AC
  Filled 2020-10-30: qty 4

## 2020-10-30 MED ORDER — METRONIDAZOLE 500 MG PO TABS: 500.0000 mg | ORAL_TABLET | Freq: Two times a day (BID) | ORAL | 0 refills | Status: AC

## 2020-10-30 NOTE — Discharge Instructions (Addendum)
POST Y90 instructions (per IR protocol ) copy included in Pt instructions, also IR on call MD [phone] on paper work.

## 2020-10-30 NOTE — H&P (Addendum)
Referring Physician(s): Byerly,F  Supervising Physician: Ruthann Cancer  Patient Status:  [facility] OP  Chief Complaint:  Multifocal hepatocellular carcinoma  Subjective: Patient known to IR service from consultation with Dr. Serafina Royals on 09/06/2020 to discuss treatment options for multifocal hepatocellular carcinoma.  He was deemed an appropriate candidate for hepatic Y 90 radioembolization and underwent hepatic/visceral roadmapping study on 10/16/2020.  He presents today for hepatic Y 90 radioembolization.  He currently denies fever, headache, chest pain, dyspnea, cough, abdominal/back pain, nausea, vomiting or bleeding.  Past medical history also significant for NASH cirrhosis, coronary artery disease with prior MI/stenting, CHF, ICD placement, prior GI bleed, diabetes, squamous cell carcinoma of the penis, GERD, hyperlipidemia, hypertension, ischemic cardiomyopathy, left bundle branch block, obesity, renal insufficiency, and peripheral neuropathy.  Past Medical History:  Diagnosis Date   Acute respiratory failure with hypoxia ([place]) 06/2016   CAD (coronary artery disease)    a. Remote PCI of the RCA;  b. 03/2013 Inf STEMI/PCI: LAD 60-70d, RCA 99p ISR(rota/CBA/DES);  c. 09/2014 MV: inf and infsept infarct, EF 35-40%;  d. 09/2014 Cath: patent RCA stent, stable LAD dzs, EF 40%-->Med rx.   CHF (congestive heart failure) ([place])    Diabetes mellitus    sees Dr. Hardin Negus in [place]   GERD (gastroesophageal reflux disease)    HFrEF (heart failure with reduced ejection fraction) ([place])    Hyperlipidemia    Hypertension    Ischemic cardiomyopathy    a. 09/2014 EF 40% by V gram;  b. 06/2016 Echo: EF 40, diff HK, Gr1 DD, inf HK, no mural thrombus. Mild MR, mildly dil LA, mildly dil RV.    LBBB (left bundle branch block)    Old   Liver mass    Morbid obesity ([place])    Peripheral neuropathy    Upper GI bleed    Vertigo    Past Surgical History:  Procedure Laterality Date   BIV ICD INSERTION CRT-D N/A  04/08/2020   Procedure: BIV ICD INSERTION CRT-D;  Surgeon: Vickie Epley, MD;  Location: [place] CV LAB;  Service: Cardiovascular;  Laterality: N/A;   CARDIAC CATHETERIZATION N/A 10/03/2014   Procedure: Left Heart Cath and Coronary Angiography;  Surgeon: [name], MD;  Location: [place] CV LAB;  Service: Cardiovascular;  Laterality: N/A;   CIRCUMCISION N/A 07/22/2015   Procedure: CIRCUMCISION ADULT;  Surgeon: Hollice Espy, MD;  Location: [facility] ORS;  Service: Urology;  Laterality: N/A;   COLONOSCOPY WITH PROPOFOL N/A 01/12/2019   Procedure: COLONOSCOPY WITH PROPOFOL;  Surgeon: Lucilla Lame, MD;  Location: [facility] ENDOSCOPY;  Service: Endoscopy;  Laterality: N/A;   CORONARY STENT PLACEMENT     ESOPHAGOGASTRODUODENOSCOPY (EGD) WITH PROPOFOL N/A 09/12/2014   Procedure: ESOPHAGOGASTRODUODENOSCOPY (EGD) WITH PROPOFOL;  Surgeon: Ronald Lobo, MD;  Location: [facility];  Service: Endoscopy;  Laterality: N/A;   EXTERNAL FIXATION LEG Left 04/03/2012   Procedure: EXTERNAL FIXATION LEG;  Surgeon: Wylene Simmer, MD;  Location: [place];  Service: Orthopedics;  Laterality: Left;   EXTERNAL FIXATION REMOVAL Left 04/14/2012   Procedure: REMOVAL EXTERNAL FIXATION LEG;  Surgeon: Wylene Simmer, MD;  Location: [place];  Service: Orthopedics;  Laterality: Left;   IR 3D INDEPENDENT WKST  10/16/2020   IR ANGIOGRAM SELECTIVE EACH ADDITIONAL VESSEL  10/16/2020   IR ANGIOGRAM VISCERAL SELECTIVE  10/16/2020   IR EMBO ARTERIAL NOT HEMORR HEMANG INC GUIDE ROADMAPPING  10/16/2020   IR RADIOLOGIST EVAL & MGMT  09/06/2020   IR US GUIDE VASC ACCESS RIGHT  10/16/2020  IRRIGATION AND DEBRIDEMENT KNEE Left 04/03/2012   Procedure: IRRIGATION AND DEBRIDEMENT KNEE;  Surgeon: Wylene Simmer, MD;  Location: [place];  Service: Orthopedics;  Laterality: Left;   LEFT HEART CATHETERIZATION WITH CORONARY ANGIOGRAM N/A 04/05/2013   Procedure: LEFT HEART CATHETERIZATION WITH CORONARY ANGIOGRAM;  Surgeon: [name], MD;  Location: [place] CATH LAB;   Service: Cardiovascular;  Laterality: N/A;   MASS EXCISION N/A 07/22/2015   Procedure: EXCISION PENILE MASS;  Surgeon: Hollice Espy, MD;  Location: [facility] ORS;  Service: Urology;  Laterality: N/A;   ORIF TIBIA PLATEAU Left 04/14/2012   Procedure: OPEN REDUCTION INTERNAL FIXATION (ORIF) TIBIAL PLATEAU;  Surgeon: Wylene Simmer, MD;  Location: [place];  Service: Orthopedics;  Laterality: Left;   PERCUTANEOUS CORONARY ROTOBLATOR INTERVENTION (PCI-R) N/A 04/06/2013   Procedure: PERCUTANEOUS CORONARY ROTOBLATOR INTERVENTION (PCI-R);  Surgeon: [name], MD;  Location: [facility] CATH LAB;  Service: Cardiovascular;  Laterality: N/A;   RIGHT/LEFT HEART CATH AND CORONARY ANGIOGRAPHY N/A 08/21/2019   Procedure: RIGHT/LEFT HEART CATH AND CORONARY ANGIOGRAPHY;  Surgeon: [name], MD;  Location: [place] CV LAB;  Service: Cardiovascular;  Laterality: N/A;      Allergies: Patient has no known allergies.  Medications: Prior to Admission medications   Medication Sig Start Date End Date Taking? Authorizing Provider  atorvastatin (LIPITOR) 40 MG tablet TAKE ONE TABLET BY MOUTH EVERY DAY 09/24/20  Yes Theora Gianotti, NP  CINNAMON PO Take 1,000 mg by mouth in the morning and at bedtime.   Yes [provider]  ferrous sulfate 325 (65 FE) MG tablet Take 162.5 mg by mouth daily with breakfast.   Yes [provider]  gabapentin (NEURONTIN) 300 MG capsule Take 1 capsule (300 mg total) by mouth every morning AND 1 capsule (300 mg total) daily with lunch AND 2 capsules (600 mg total) at bedtime. 10/04/20  Yes [name], MD  Multiple Vitamin (MULTIVITAMIN WITH MINERALS) TABS Take 1 tablet by mouth daily. Multivitamin for Men 4+   Yes [provider]  carvedilol (COREG) 12.5 MG tablet Take 1 tablet (12.5 mg total) by mouth 2 (two) times daily. 10/23/20   Vickie Epley, MD  clopidogrel (PLAVIX) 75 MG tablet TAKE ONE TABLET EVERY MORNING WITH BREAKFAST 10/17/20   [name], MD  dapagliflozin propanediol ([name]) 10 MG TABS tablet Take 1 tablet (10 mg total) by mouth daily before breakfast. 10/24/20   [name], MD  Dulaglutide ([name]) 3 VP/7.1GG SOPN Inject 3 mg as directed once a week. 01/25/20   [name], MD  furosemide (LASIX) 40 MG tablet Take 1 tablet (40 mg total) by mouth daily. 10/24/20   [name], MD  mexiletine (MEXITIL) 150 MG capsule Take 1 capsule (150 mg total) by mouth 2 (two) times daily. 10/23/20   Vickie Epley, MD  nitroGLYCERIN (NITROSTAT) 0.4 MG SL tablet Place 1 tablet (0.4 mg total) under the tongue every 5 (five) minutes as needed for chest pain. 05/16/20   [name], MD  pantoprazole (PROTONIX) 40 MG tablet TAKE 1 TABLET BY MOUTH TWICE DAILY. 10/17/20   [name], MD  sacubitril-valsartan (ENTRESTO) 49-51 MG Take 0.5 tablets by mouth 2 (two) times daily.    [provider]     Vital Signs: BP (!) 161/81   Pulse 70   Temp 98 F (36.7 C) (Oral)   Resp 17   SpO2 100%   Physical Exam awake, alert.  Chest clear to auscultation  bilaterally.  Left chest wall ICD in place.  Heart with regular rate and rhythm.  Abdomen obese, soft, positive bowel sounds, nontender.  Bilateral lower extremity edema noted  Imaging: No results found.  Labs:  CBC: Recent Labs    07/05/20 1503 08/21/20 1028 10/16/20 0723 10/30/20 0740  WBC 6.9 6.8 5.8 8.3  HGB 14.8 14.8 15.0 15.2  HCT 43.0 43.1 44.6 45.9  PLT 201 162 154 172    COAGS: Recent Labs    10/16/20 0723 10/30/20 0740  INR 1.1 1.1  APTT 31  --     BMP: Recent Labs    08/01/20 1129 08/21/20 1028 10/04/20 0901 10/16/20 0723 10/23/20 1421 10/30/20 0740  NA 137 136 138 137 140 140  K 4.3 4.2 4.2 4.0 4.2 3.9  CL 103 101 101 104 103 102  CO2 25 25 26 24 22 24   GLUCOSE 131* 131* 114* 127* 124* 131*  BUN 15 25* 17 16 16 21   CALCIUM 8.9 8.8* 9.2 8.8* 8.9 8.8*  CREATININE 1.22 1.70* 1.47 1.29* 1.22 1.46*   GFRNONAA >60 42*  --  59*  --  51*    LIVER FUNCTION TESTS: Recent Labs    07/05/20 1503 07/29/20 0000 08/21/20 1028 10/16/20 0723 10/30/20 0740  BILITOT 0.6  --  1.0 1.0 1.6*  AST 40  --  33 48* 45*  ALT 36  --  25 33 36  ALKPHOS 165* 153* 116 125 137*  PROT 6.6  --  7.1 6.2* 6.9  ALBUMIN 3.8  --  3.6 3.4* 3.6    Assessment and Plan: Patient known to IR service from consultation with Dr. Serafina Royals on 09/06/2020 to discuss treatment options for multifocal hepatocellular carcinoma.  He was deemed an appropriate candidate for hepatic Y 90 radioembolization and underwent hepatic/visceral roadmapping study on 10/16/2020.  He presents today for hepatic Y 90 radioembolization.  He currently denies fever, headache, chest pain, dyspnea, cough, abdominal/back pain, nausea, vomiting or bleeding.  Past medical history also significant for NASH cirrhosis, coronary artery disease with prior MI/stenting, CHF, ICD placement, prior GI bleed, diabetes, squamous cell carcinoma of the penis, GERD, hyperlipidemia, hypertension, ischemic cardiomyopathy, left bundle branch block, obesity, renal insufficiency, and peripheral neuropathy.Risks and benefits of procedure were discussed with the patient including, but not limited to bleeding, infection, vascular injury or contrast induced renal failure.  This interventional procedure involves the use of X-rays and because of the nature of the planned procedure, it is possible that we will have prolonged use of X-ray fluoroscopy.  Potential radiation risks to you include (but are not limited to) the following: - A slightly elevated risk for cancer  several years later in life. This risk is typically less than 0.5% percent. This risk is low in comparison to the normal incidence of human cancer, which is 33% for women and 50% for men according to the [place]. - Radiation induced injury can include skin redness, resembling a rash, tissue breakdown / ulcers  and hair loss (which can be temporary or permanent).   The likelihood of either of these occurring depends on the difficulty of the procedure and whether you are sensitive to radiation due to previous procedures, disease, or genetic conditions.   IF your procedure requires a prolonged use of radiation, you will be notified and given written instructions for further action.  It is your responsibility to monitor the irradiated area for the 2 weeks following the procedure and to notify your physician if you  are concerned that you have suffered a radiation induced injury.    All of the patient's questions were answered, patient is agreeable to proceed.  Consent signed and in chart.  Latest creatinine 1.46, total bilirubin 1.6, PT/INR normal, CBC normal.    Electronically Signed: D. Rowe Robert, PA-C 10/30/2020, 8:44 AM   I spent a total of 25 minutes at the the patient's bedside AND on the patient's hospital floor or unit, greater than 50% of which was counseling/coordinating care for hepatic/visceral arteriogram with hepatic Y 90 radioembolization

## 2020-10-30 NOTE — H&P (Addendum)
Referring Physician(s): Byerly,F   Supervising Physician: Ruthann Cancer   Patient Status:  [facility] OP   Chief Complaint:   Multifocal hepatocellular carcinoma   Subjective: Patient known to IR service from consultation with Dr. Serafina Royals on 09/06/2020 to discuss treatment options for multifocal hepatocellular carcinoma.  He was deemed an appropriate candidate for hepatic Y 90 radioembolization and underwent hepatic/visceral roadmapping study on 10/16/2020.  He presents today for hepatic Y 90 radioembolization.  He currently denies fever, headache, chest pain, dyspnea, cough, abdominal/back pain, nausea, vomiting or bleeding.  Past medical history also significant for NASH cirrhosis, coronary artery disease with prior MI/stenting, CHF, ICD placement, prior GI bleed, diabetes, squamous cell carcinoma of the penis, GERD, hyperlipidemia, hypertension, ischemic cardiomyopathy, left bundle branch block, obesity, renal insufficiency, and peripheral neuropathy.       Past Medical History:  Diagnosis Date   Acute respiratory failure with hypoxia ([place]) 06/2016   CAD (coronary artery disease)      a. Remote PCI of the RCA;  b. 03/2013 Inf STEMI/PCI: LAD 60-70d, RCA 99p ISR(rota/CBA/DES);  c. 09/2014 MV: inf and infsept infarct, EF 35-40%;  d. 09/2014 Cath: patent RCA stent, stable LAD dzs, EF 40%-->Med rx.   CHF (congestive heart failure) ([place])     Diabetes mellitus      sees Dr. Hardin Negus in [place]   GERD (gastroesophageal reflux disease)     HFrEF (heart failure with reduced ejection fraction) ([place])     Hyperlipidemia     Hypertension     Ischemic cardiomyopathy       a. 09/2014 EF 40% by V gram;  b. 06/2016 Echo: EF 40, diff HK, Gr1 DD, inf HK, no mural thrombus. Mild MR, mildly dil LA, mildly dil RV.    LBBB (left bundle branch block)      Old   Liver mass     Morbid obesity ([facility])     Peripheral neuropathy     Upper GI bleed     Vertigo  Past Surgical History:  Procedure Laterality Date   BIV ICD INSERTION CRT-D N/A 04/08/2020    Procedure: BIV ICD INSERTION CRT-D;  Surgeon: Vickie Epley, MD;  Location: [place] CV LAB;  Service: Cardiovascular;  Laterality: N/A;   CARDIAC CATHETERIZATION N/A 10/03/2014    Procedure: Left Heart Cath and Coronary Angiography;  Surgeon: [name], MD;  Location: [place] CV LAB;  Service: Cardiovascular;  Laterality: N/A;   CIRCUMCISION N/A 07/22/2015    Procedure: CIRCUMCISION ADULT;  Surgeon: Hollice Espy, MD;  Location: [facility] ORS;  Service: Urology;  Laterality: N/A;   COLONOSCOPY WITH PROPOFOL N/A 01/12/2019    Procedure: COLONOSCOPY WITH PROPOFOL;  Surgeon: Lucilla Lame, MD;  Location: [facility] ENDOSCOPY;  Service: Endoscopy;  Laterality: N/A;   CORONARY STENT PLACEMENT       ESOPHAGOGASTRODUODENOSCOPY (EGD) WITH PROPOFOL N/A 09/12/2014    Procedure: ESOPHAGOGASTRODUODENOSCOPY (EGD) WITH PROPOFOL;  Surgeon: Ronald Lobo, MD;  Location: [facility] ENDOSCOPY;  Service: Endoscopy;  Laterality: N/A;   EXTERNAL FIXATION LEG Left 04/03/2012    Procedure: EXTERNAL FIXATION LEG;  Surgeon: Wylene Simmer, MD;  Location: [place];  Service: Orthopedics;  Laterality: Left;   EXTERNAL FIXATION REMOVAL Left 04/14/2012    Procedure: REMOVAL EXTERNAL FIXATION LEG;  Surgeon: Wylene Simmer, MD;  Location: [place];  Service: Orthopedics;  Laterality: Left;   IR 3D INDEPENDENT WKST   10/16/2020   IR ANGIOGRAM SELECTIVE EACH ADDITIONAL VESSEL   10/16/2020   IR ANGIOGRAM VISCERAL SELECTIVE   10/16/2020   IR EMBO ARTERIAL NOT HEMORR HEMANG INC GUIDE ROADMAPPING   10/16/2020   IR RADIOLOGIST EVAL & MGMT   09/06/2020   IR US GUIDE VASC  ACCESS RIGHT   10/16/2020   IRRIGATION AND DEBRIDEMENT KNEE Left 04/03/2012    Procedure: IRRIGATION AND DEBRIDEMENT KNEE;  Surgeon: Wylene Simmer, MD;  Location: [place];  Service: Orthopedics;  Laterality: Left;   LEFT HEART CATHETERIZATION WITH CORONARY ANGIOGRAM N/A 04/05/2013    Procedure: LEFT HEART CATHETERIZATION WITH CORONARY ANGIOGRAM;  Surgeon: [name], MD;  Location: [place] CATH LAB;  Service: Cardiovascular;  Laterality: N/A;   MASS EXCISION N/A 07/22/2015    Procedure: EXCISION PENILE MASS;  Surgeon: Hollice Espy, MD;  Location: [facility] ORS;  Service: Urology;  Laterality: N/A;   ORIF TIBIA PLATEAU Left 04/14/2012    Procedure: OPEN REDUCTION INTERNAL FIXATION (ORIF) TIBIAL PLATEAU;  Surgeon: Wylene Simmer, MD;  Location: [place];  Service: Orthopedics;  Laterality: Left;   PERCUTANEOUS CORONARY ROTOBLATOR INTERVENTION (PCI-R) N/A 04/06/2013    Procedure: PERCUTANEOUS CORONARY ROTOBLATOR INTERVENTION (PCI-R);  Surgeon: [name], MD;  Location: [facility] CATH LAB;  Service: Cardiovascular;  Laterality: N/A;   RIGHT/LEFT HEART CATH AND CORONARY ANGIOGRAPHY N/A 08/21/2019    Procedure: RIGHT/LEFT HEART CATH AND CORONARY ANGIOGRAPHY;  Surgeon: [name], MD;  Location: [place] CV LAB;  Service: Cardiovascular;  Laterality: N/A;          Allergies: Patient has no known allergies.   Medications:        Prior to Admission medications   Medication Sig Start Date End Date Taking? Authorizing Provider  atorvastatin (LIPITOR) 40 MG tablet TAKE ONE TABLET BY MOUTH EVERY DAY 09/24/20   Yes Theora Gianotti, NP  CINNAMON PO Take 1,000 mg by mouth in the morning and at bedtime.     Yes [provider]  ferrous sulfate 325 (65 FE) MG tablet Take 162.5 mg by mouth daily with breakfast.     Yes  [provider]  gabapentin (NEURONTIN) 300 MG capsule Take 1 capsule (300 mg total) by mouth every morning AND 1 capsule (300 mg total) daily with lunch AND 2 capsules (600  mg total) at bedtime. 10/04/20   Yes [name], MD  Multiple Vitamin (MULTIVITAMIN WITH MINERALS) TABS Take 1 tablet by mouth daily. Multivitamin for Men 62+     Yes [provider]  carvedilol (COREG) 12.5 MG tablet Take 1 tablet (12.5 mg total) by mouth 2 (two) times daily. 10/23/20     Vickie Epley, MD  clopidogrel (PLAVIX) 75 MG tablet TAKE ONE TABLET EVERY MORNING WITH BREAKFAST 10/17/20     [name], MD  dapagliflozin propanediol ([name]) 10 MG TABS tablet Take 1 tablet (10 mg total) by mouth daily before breakfast. 10/24/20     [name], MD  Dulaglutide ([name]) 3 WC/3.7SE SOPN Inject 3 mg as directed once a week. 01/25/20     [name], MD  furosemide (LASIX) 40 MG tablet Take 1 tablet (40 mg total) by mouth daily. 10/24/20     [name], MD  mexiletine (MEXITIL) 150 MG capsule Take 1 capsule (150 mg total) by mouth 2 (two) times daily. 10/23/20     Vickie Epley, MD  nitroGLYCERIN (NITROSTAT) 0.4 MG SL tablet Place 1 tablet (0.4 mg total) under the tongue every 5 (five) minutes as needed for chest pain. 05/16/20     [name], MD  pantoprazole (PROTONIX) 40 MG tablet TAKE 1 TABLET BY MOUTH TWICE DAILY. 10/17/20     [name], MD  sacubitril-valsartan (ENTRESTO) 49-51 MG Take 0.5 tablets by mouth 2 (two) times daily.       [provider]        Vital Signs: BP (!) 161/81   Pulse 70   Temp 98 F (36.7 C) (Oral)   Resp 17   SpO2 100%    Physical Exam awake, alert.  Chest clear to auscultation bilaterally.  Left chest wall ICD in place.  Heart with regular rate and rhythm.  Abdomen obese, soft, positive bowel sounds, nontender.  Bilateral lower extremity edema noted   Imaging: No results found.   Labs:   CBC: Recent Labs (within last 365 days)        Recent Labs    07/05/20 1503 08/21/20 1028 10/16/20 0723 10/30/20 0740  WBC 6.9 6.8 5.8 8.3  HGB 14.8 14.8 15.0 15.2  HCT 43.0 43.1 44.6  45.9  PLT 201 162 154 172        COAGS: Recent Labs (within last 365 days)      Recent Labs    10/16/20 0723 10/30/20 0740  INR 1.1 1.1  APTT 31  --         BMP: Recent Labs (within last 365 days)          Recent Labs    08/01/20 1129 08/21/20 1028 10/04/20 0901 10/16/20 0723 10/23/20 1421 10/30/20 0740  NA 137 136 138 137 140 140  K 4.3 4.2 4.2 4.0 4.2 3.9  CL 103 101 101 104 103 102  CO2 25 25 26 24 22 24   GLUCOSE 131* 131* 114* 127* 124* 131*  BUN 15 25* 17 16 16 21   CALCIUM 8.9 8.8* 9.2 8.8* 8.9 8.8*  CREATININE 1.22 1.70* 1.47 1.29* 1.22 1.46*  GFRNONAA >60 42*  --  59*  --  51*        LIVER FUNCTION TESTS:  Recent Labs (within last 365 days)         Recent Labs    07/05/20 1503 07/29/20 0000 08/21/20 1028 10/16/20 0723 10/30/20 0740  BILITOT 0.6  --  1.0 1.0 1.6*  AST 40  --  33 48* 45*  ALT 36  --  25 33 36  ALKPHOS 165* 153* 116 125 137*  PROT 6.6  --  7.1 6.2* 6.9  ALBUMIN 3.8  --  3.6 3.4* 3.6        Assessment and Plan: Patient known to IR service from consultation with Dr. Serafina Royals on 09/06/2020 to discuss treatment options for multifocal hepatocellular carcinoma.  He was deemed an appropriate candidate for hepatic Y 90 radioembolization and underwent hepatic/visceral roadmapping study on 10/16/2020.  He presents today for hepatic Y 90 radioembolization.  He currently denies fever, headache, chest pain, dyspnea, cough, abdominal/back pain, nausea, vomiting or bleeding.  Past medical history also significant for NASH cirrhosis, coronary artery disease with prior MI/stenting, CHF, ICD placement, prior GI bleed, diabetes, squamous cell carcinoma of the penis, GERD, hyperlipidemia, hypertension, ischemic cardiomyopathy, left bundle branch block, obesity, renal insufficiency, and peripheral neuropathy.Risks and benefits of procedure were discussed with the patient including, but not limited to bleeding, infection, vascular injury or contrast induced renal  failure.   This interventional procedure involves the use of X-rays and because of the nature of the planned procedure, it is possible that we will have prolonged use of X-ray fluoroscopy.   Potential radiation risks to you include (but are not limited to) the following: - A slightly elevated risk for cancer                   several years later in life. This risk is typically less than 0.5% percent. This risk is low in comparison to the normal incidence of human cancer, which is 33% for women and 50% for men according to the [place]. - Radiation induced injury can include skin redness, resembling a rash, tissue breakdown / ulcers and hair loss (which can be temporary or permanent).    The likelihood of either of these occurring depends on the difficulty of the procedure and whether you are sensitive to radiation due to previous procedures, disease, or genetic conditions.    IF your procedure requires a prolonged use of radiation, you will be notified and given written instructions for further action.  It is your responsibility to monitor the irradiated area for the 2 weeks following the procedure and to notify your physician if you are concerned that you have suffered a radiation induced injury.     All of the patient's questions were answered, patient is agreeable to proceed.   Consent signed and in chart.   Latest creatinine 1.46, total bilirubin 1.6, PT/INR normal, CBC normal.       Electronically Signed: D. Rowe Robert, PA-C 10/30/2020, 8:44 AM   I spent a total of 25 minutes at the the patient's bedside AND on the patient's hospital floor or unit, greater than 50% of which was counseling/coordinating care for hepatic/visceral arteriogram with hepatic Y 90 radioembolization               R

## 2020-10-30 NOTE — Sedation Documentation (Signed)
Moving pt to nuc med

## 2020-10-30 NOTE — Procedures (Addendum)
Interventional Radiology Procedure Note  Procedure: Transarterial radioembolization of right lobe of liver  Findings: Please refer to procedural dictation for full description.  Complications: None immediate  Estimated Blood Loss: < 5 mL  Recommendations: 4 hours bedrest (2 hours supine followed by 2 hours head of bed up to 30 degrees) 3 days levofloxacin + metronidazole Plan for discharge home today. Follow up in IR clinic with CBC, CMP, a-FP in 1 month.  Ruthann Cancer, MD Pager: [phone]

## 2020-10-30 NOTE — Progress Notes (Signed)
D/C Instructions edit locked at time of discharge, D/C instruction modifications reviewed with daughter and Pt per Dr Serafina Royals instructions (post Y90 and Ir MD on call#)

## 2020-10-31 ENCOUNTER — Telehealth: Payer: Self-pay

## 2020-10-31 NOTE — Telephone Encounter (Signed)
Called and spoke with daughter, [name]. Notified that Dr. Serafina Royals will do ongoing follow up for [facility]. No follow up with Dr. Rogue Bussing needed at this time. He can see him back in the future as needed.

## 2020-11-04 ENCOUNTER — Other Ambulatory Visit: Payer: Self-pay | Admitting: Interventional Radiology

## 2020-11-04 DIAGNOSIS — C220 Liver cell carcinoma: Secondary | ICD-10-CM

## 2020-11-08 DIAGNOSIS — E1142 Type 2 diabetes mellitus with diabetic polyneuropathy: Secondary | ICD-10-CM

## 2020-11-08 DIAGNOSIS — E785 Hyperlipidemia, unspecified: Secondary | ICD-10-CM | POA: Diagnosis not present

## 2020-11-08 DIAGNOSIS — I10 Essential (primary) hypertension: Secondary | ICD-10-CM

## 2020-11-08 DIAGNOSIS — I25118 Atherosclerotic heart disease of native coronary artery with other forms of angina pectoris: Secondary | ICD-10-CM | POA: Diagnosis not present

## 2020-11-08 DIAGNOSIS — I5022 Chronic systolic (congestive) heart failure: Secondary | ICD-10-CM

## 2020-11-15 ENCOUNTER — Other Ambulatory Visit: Payer: Self-pay | Admitting: Nurse Practitioner

## 2020-11-15 NOTE — Telephone Encounter (Signed)
This is a [place] pt 

## 2020-11-19 ENCOUNTER — Ambulatory Visit (INDEPENDENT_AMBULATORY_CARE_PROVIDER_SITE_OTHER): Payer: PPO | Admitting: Pharmacist

## 2020-11-19 DIAGNOSIS — I25118 Atherosclerotic heart disease of native coronary artery with other forms of angina pectoris: Secondary | ICD-10-CM

## 2020-11-19 DIAGNOSIS — E785 Hyperlipidemia, unspecified: Secondary | ICD-10-CM

## 2020-11-19 DIAGNOSIS — K279 Peptic ulcer, site unspecified, unspecified as acute or chronic, without hemorrhage or perforation: Secondary | ICD-10-CM

## 2020-11-19 DIAGNOSIS — I5022 Chronic systolic (congestive) heart failure: Secondary | ICD-10-CM

## 2020-11-19 DIAGNOSIS — I10 Essential (primary) hypertension: Secondary | ICD-10-CM

## 2020-11-19 DIAGNOSIS — E1142 Type 2 diabetes mellitus with diabetic polyneuropathy: Secondary | ICD-10-CM

## 2020-11-19 MED ORDER — DAPAGLIFLOZIN PROPANEDIOL 10 MG PO TABS: 10.0000 mg | ORAL_TABLET | Freq: Every day | ORAL | 3 refills | Status: DC

## 2020-11-19 NOTE — Patient Instructions (Signed)
Mr. Vittorio,   Keep up the great work!  Please schedule your yearly diabetic eye exam. Once completed, have them send the results to Dr. [name].  Consider the COVID vaccine series and let me know if you have questions.   My pharmacy technician, Sharee Pimple, will mail you the patient portions of the patient assistance application(s) for [name], and [name]. Please complete these along with any other documentation requested and return to [place].   Take care!  [name], [name]  Visit Information  PATIENT GOALS:  Goals Addressed               This Visit's Progress     Patient Stated     Medication Monitoring (pt-stated)        Patient Goals/Self-Care Activities Over the next 90 days, patient will:  - take medications as prescribed check glucose 3-5 times weekly, document, and provide at future appointments check blood pressure 3-5 times weekly, document, and provide at future appointments weigh daily, and contact provider if weight gain of greater than 3 lbs in a day or greater than 5 lbs in a week collaborate with provider on medication access solutions         Patient verbalizes understanding of instructions provided today and agrees to view in [place].   Plan: Telephone follow up appointment with care management team member scheduled for:  3 months   [name], [name], [place], [place] Clinical Pharmacist [employer] at [employer] [phone]

## 2020-11-19 NOTE — Chronic Care Management (AMB) (Signed)
Chronic Care Management Pharmacy Note  11/19/2020 Name:  Devin Hanna MRN:  [id number] DOB:  29-Aug-1950  Subjective: Devin Hanna is an 70 y.o. year old male who is a primary patient of [name], Angela Adam, MD.  The CCM team was consulted for assistance with disease management and care coordination needs.    Engaged with patient by telephone for follow up visit in response to provider referral for pharmacy case management and/or care coordination services.   Consent to Services:  The patient was given information about Chronic Care Management services, agreed to services, and gave verbal consent prior to initiation of services.  Please see initial visit note for detailed documentation.   Patient Care Team: [name], MD as PCP - General (Family Medicine) [name], MD as PCP - Cardiology (Cardiology) Vickie Epley, MD as PCP - Electrophysiology (Cardiology) Clent Jacks, RN as Oncology Nurse Navigator [name], RPH-CPP (Pharmacist)   Objective:  Lab Results  Component Value Date   CREATININE 1.46 (H) 10/30/2020   CREATININE 1.22 10/23/2020   CREATININE 1.29 (H) 10/16/2020    Lab Results  Component Value Date   HGBA1C 6.4 10/04/2020   Last diabetic Eye exam: No results found for: HMDIABEYEEXA  Last diabetic Foot exam: No results found for: HMDIABFOOTEX      Component Value Date/Time   CHOL 103 06/26/2020 0816   CHOL 103 06/07/2014 0824   TRIG 159.0 (H) 06/26/2020 0816   HDL 34.50 (L) 06/26/2020 0816   HDL 38 (L) 06/07/2014 0824   CHOLHDL 3 06/26/2020 0816   VLDL 31.8 06/26/2020 0816   LDLCALC 37 06/26/2020 0816   LDLCALC 59 03/30/2018 0930    Hepatic Function Latest Ref Rng & Units 10/30/2020 10/16/2020 08/21/2020  Total Protein 6.5 - 8.1 g/dL 6.9 6.2(L) 7.1  Albumin 3.5 - 5.0 g/dL 3.6 3.4(L) 3.6  AST 15 - 41 U/L 45(H) 48(H) 33  ALT 0 - 44 U/L 36 33 25  Alk Phosphatase 38 - 126 U/L 137(H) 125 116  Total Bilirubin 0.3 -  1.2 mg/dL 1.6(H) 1.0 1.0  Bilirubin, Direct - - - -    Lab Results  Component Value Date/Time   TSH 3.420 07/05/2020 03:03 PM   TSH 0.889 09/09/2014 03:05 AM   TSH 3.056 04/04/2013 10:20 AM    CBC Latest Ref Rng & Units 10/30/2020 10/16/2020 08/21/2020  WBC 4.0 - 10.5 K/uL 8.3 5.8 6.8  Hemoglobin 13.0 - 17.0 g/dL 15.2 15.0 14.8  Hematocrit 39.0 - 52.0 % 45.9 44.6 43.1  Platelets 150 - 400 K/uL 172 154 162      Social History   Tobacco Use  Smoking Status Former   Packs/day: 3.00   Years: 40.00   Pack years: 120.00   Types: Cigarettes   Quit date: 04/05/1995   Years since quitting: 25.6  Smokeless Tobacco Former  Tobacco Comments   started smoking at age 41   BP Readings from Last 3 Encounters:  10/30/20 (!) 141/76  10/30/20 (!) 144/74  10/24/20 122/70   Pulse Readings from Last 3 Encounters:  10/30/20 70  10/30/20 70  10/24/20 72   Wt Readings from Last 3 Encounters:  10/30/20 231 lb (104.8 kg)  10/24/20 239 lb 2 oz (108.5 kg)  10/23/20 236 lb (107 kg)    Assessment: Review of patient past medical history, allergies, medications, health status, including review of consultants reports, laboratory and other test data, was performed as part of comprehensive evaluation  and provision of chronic care management services.   SDOH:  (Social Determinants of Health) assessments and interventions performed:  SDOH Interventions    Flowsheet Row Most Recent Value  SDOH Interventions   Financial Strain Interventions Other (Comment)  [manufacturer assistance]       CCM Care Plan  No Known Allergies  Medications Reviewed Today     Reviewed by [name], RPH-CPP (Pharmacist) on 11/19/20 at 0909  Med List Status: <None>   Medication Order Taking? Sig Documenting Provider Last Dose Status Informant  atorvastatin (LIPITOR) 40 MG tablet [id number] Yes TAKE ONE TABLET BY MOUTH EVERY DAY Theora Gianotti, NP Taking Active   carvedilol (COREG) 12.5 MG  tablet [id number] Yes Take 1 tablet (12.5 mg total) by mouth 2 (two) times daily. Vickie Epley, MD Taking Active   CINNAMON PO [id number] Yes Take 1,000 mg by mouth in the morning and at bedtime. [provider] Taking Active Child  clopidogrel (PLAVIX) 75 MG tablet [id number] Yes TAKE ONE TABLET EVERY MORNING WITH BREAKFAST [name], MD Taking Active   dapagliflozin propanediol ([name]) 10 MG TABS tablet [id number] Yes Take 1 tablet (10 mg total) by mouth daily before breakfast. [name], MD Taking Active   Dulaglutide ([name]) 3 GE/3.6OQ SOPN [id number] Yes Inject 3 mg as directed once a week. [name], MD Taking Active Child  ferrous sulfate 325 (65 FE) MG tablet [id number] Yes Take 162.5 mg by mouth daily with breakfast. [provider] Taking Active   furosemide (LASIX) 40 MG tablet [id number] Yes Take 1 tablet (40 mg total) by mouth daily. [name], MD Taking Active   gabapentin (NEURONTIN) 300 MG capsule [id number] Yes Take 1 capsule (300 mg total) by mouth every morning AND 1 capsule (300 mg total) daily with lunch AND 2 capsules (600 mg total) at bedtime. [name], MD Taking Active   mexiletine (MEXITIL) 150 MG capsule [id number] Yes Take 1 capsule (150 mg total) by mouth 2 (two) times daily. Vickie Epley, MD Taking Active   Multiple Vitamin (MULTIVITAMIN WITH MINERALS) TABS [id number] Yes Take 1 tablet by mouth daily. Multivitamin for Men 50+ [provider] Taking Active Child  nitroGLYCERIN (NITROSTAT) 0.4 MG SL tablet [id number]  Place 1 tablet (0.4 mg total) under the tongue every 5 (five) minutes as needed for chest pain. [name], MD  Active            Med Note Lavon Paganini R   Wed Oct 16, 2020  7:25 AM) Has medication, has never taken  pantoprazole (PROTONIX) 40 MG tablet [id number] Yes TAKE 1 TABLET BY MOUTH TWICE DAILY. [name], MD Taking Active   sacubitril-valsartan (ENTRESTO) 49-51  MG [id number] Yes Take 0.5 tablets by mouth 2 (two) times daily. [provider] Taking Active             Patient Active Problem List   Diagnosis Date Noted   Persistent atrial fibrillation ([place]) 09/05/2020   Secondary hypercoagulable state ([place]) 09/05/2020   Cough 01/11/2020   Left knee pain 01/11/2020   Abnormal cardiovascular stress test    Acute on chronic systolic heart failure ([place]) 07/30/2019   AK (actinic keratosis) 04/03/2019   Colon cancer screening    Rectal polyp    Polyp of transverse colon    Liver lesion 12/06/2018   Chronic kidney disease (CKD), stage III (moderate) ([place]) 12/06/2018   Abrasion 12/06/2018   History of anemia 03/30/2018  Hyperpigmented skin lesion 12/20/2017   Bilateral foot pain 12/20/2017   DDD (degenerative disc disease), lumbar 08/18/2017   SCC (squamous cell carcinoma), trunk 05/17/2017   Venous insufficiency 02/12/2017   Decreased pedal pulses 02/12/2017   Skin lesion 02/12/2017   Neuropathy 01/09/2016   Elevated LFTs 06/28/2015   History of penile cancer 06/28/2015   Peptic ulcer disease 09/12/2014   Erectile dysfunction 68/01/7516   Chronic systolic heart failure ([place]) 05/15/2013   Hyperlipidemia 05/15/2013   DM type 2 (diabetes mellitus, type 2) ([place]) 04/07/2013   Other and unspecified hyperlipidemia 04/07/2013   Morbid obesity ([place]) 04/07/2013   HTN (hypertension) 04/07/2013   CAD (coronary artery disease) 04/04/2013    Immunization History  Administered Date(s) Administered   Fluad Quad(high Dose 65+) 10/26/2018, 01/11/2020   Influenza, High Dose Seasonal PF 01/09/2016, 10/27/2016, 12/20/2017   Pneumococcal Conjugate-13 01/09/2016   Pneumococcal Polysaccharide-23 09/09/2014   Tdap 01/21/2018    Conditions to be addressed/monitored: Atrial Fibrillation, CHF, HTN, HLD, and DMII  Care Plan : Medication Management  Updates made by [name], RPH-CPP since 11/19/2020 12:00 AM     Problem: Diabetes,  CHF, HTN      Long-Range Goal: Disease Progression Prevention   Start Date: 08/08/2020  Recent Progress: On track  Priority: High  Note:   Current Barriers:  Unable to independently afford treatment regimen Unable to achieve control of diabetes  Complex patient with multiple comorbidities including HFrEF, CAD, HLD  Pharmacist Clinical Goal(s):  Over the next 90 days, patient will verbalize ability to afford treatment regimen. Over the next 90 days, patient will achieve adherence to monitoring guidelines and medication adherence to achieve therapeutic efficacy. Over the next 90 days, patient will achieve control of diabetes as evidenced by improvement in A1c  Interventions: 1:1 collaboration with [name], MD regarding development and update of comprehensive plan of care as evidenced by provider attestation and co-signature Inter-disciplinary care team collaboration (see longitudinal plan of care) Comprehensive medication review performed; medication list updated in electronic medical record  Health Maintenance   Yearly diabetic eye exam: due - recommended to schedule today Yearly diabetic foot exam: up to date Urine microalbumin: up to date Yearly influenza vaccination: due - scheduled for nurse visit after AWV visit later this week Td/Tdap vaccination: up to date Pneumonia vaccination: up to date COVID vaccinations: due - patient refuses Shingrix vaccinations: due - will discuss in 2023 Colonoscopy: up to date  Diabetes: Controlled; current treatment: [name] 3 mg weekly; [name] 10 mg daily Metformin d/c d/t renal function  Approved for patient assistance for [name], [name] through 02/08/21. Current glucose readings: fastings and 2 hour post prandials: 110-120s  Current meal patterns: breakfast: egg, hashbrowns, coffee; lunch: sometimes will fix 1/2 sandwich, sometimes goes out and has chicken, vegetables, water; supper: vegetables, sometimes 1/2 sandwich;   Current physical activity: stay active at home Recommended to continue current regimen at this time.  Will collaborate w/ CPhT, patient, and providers to reapply for patient assistance for Wilder Glade, [name] for 0017.   Heart Failure, improved s/p CRT-D placement 03/2020; w/ Atrial Fibrillation  Appropriately managed; current treatment:  ARNI/ACEi/ARB: Entresto 49/51 mg - 1/2 tablet twice daily Beta blocker: carvedilol 12.5 mg twice daily SGLT2: [name] 10 mg daily Mineralocorticoid Receptor Antagonist: previously stopped d/t renal function, could be considered moving forward pending BP Diuretic: furosemide 40 mg daily  Arrhythmia suppression: mexiletine 150 mg BID Anticoagulation deferred d/t active liver malignancy.  Most recent ECHO: 08/2020 with EF 50-55%  Current home vitals: BP 110-130s/60-70s Denies/reports hypotensive/hypertensive symptoms. Denies GI upset since mexiletine was started.  Confirms understanding of importance of weighing daily. Confirms understanding to contact cardiology/primary care team if weight gain >3 lbs in 1 day or >5 lbs in 1 week Recommended to continue current regimen at this time Will collaborate w/ CPhT, patient, and providers to reapply for patient assistance for [employer] for 2023.  Hyperlipidemia, secondary ASCVD prevention (hx MI w/ RCA stenting) Controlled per last lipid panel; current treatment: atorvastatin 40 mg daily Antiplatelet regimen: clopidogrel 75 mg daily Previously recommended to continue current regimen at this time.   GERD/hx peptic ulcer disease: Appropriately managed and controlled per patient report; current treatment: pantoprazole 40 mg BID Previously recommended to continue current regimen at this time. PPI use appropriate given PUD.  Supplement: Cinnamon  Patient Goals/Self-Care Activities Over the next 90 days, patient will:  - take medications as prescribed check glucose 3-4 times weekly, document, and provide at future  appointments check blood pressure twice daily, document, and provide at future appointments weigh daily, and contact provider if weight gain of greater than 3 lbs in a day or greater than 5 lbs in a week collaborate with provider on medication access solutions  Follow Up Plan: Telephone follow up appointment with care management team member scheduled for: ~ 3 months      Medication Assistance:  [name], [name] obtained through   medication assistance program.  Enrollment ends 22/29/79; reapplication in process  Patient's preferred pharmacy is:  [facility], [place] - [address] [place] [address] Phone: [phone] Fax: [phone]  [facility] - [place], [address] [place] [address] Phone: [phone] Fax: [phone]  [facility], [place] [address] [place] [address] Phone: [phone] Fax: [phone]  [facility] [place], [place] - [address] [place] [address] Phone: [phone] Fax: [phone]    Follow Up:  Patient agrees to Care Plan and Follow-up.  Plan: Telephone follow up appointment with care management team member scheduled for:  3 months  [name], [name], [place], [place] Clinical Pharmacist [employer] at [employer] [phone]

## 2020-11-21 ENCOUNTER — Other Ambulatory Visit: Payer: Self-pay

## 2020-11-21 ENCOUNTER — Ambulatory Visit (INDEPENDENT_AMBULATORY_CARE_PROVIDER_SITE_OTHER): Payer: PPO

## 2020-11-21 VITALS — BP 123/72 | HR 69 | Resp 15 | Ht 70.0 in | Wt 236.8 lb

## 2020-11-21 DIAGNOSIS — Z0000 Encounter for general adult medical examination without abnormal findings: Secondary | ICD-10-CM

## 2020-11-21 DIAGNOSIS — Z23 Encounter for immunization: Secondary | ICD-10-CM

## 2020-11-21 NOTE — Patient Instructions (Addendum)
Devin Hanna , Thank you for taking time to come for your Medicare Wellness Visit. I appreciate your ongoing commitment to your health goals. Please review the following plan we discussed and let me know if I can assist you in the future.   These are the goals we discussed:  Goals       Patient Stated     Medication Monitoring (pt-stated)      Patient Goals/Self-Care Activities Over the next 90 days, patient will:  - take medications as prescribed check glucose 3-5 times weekly, document, and provide at future appointments check blood pressure 3-5 times weekly, document, and provide at future appointments weigh daily, and contact provider if weight gain of greater than 3 lbs in a day or greater than 5 lbs in a week collaborate with provider on medication access solutions       Other     Healthy Lifestyle      Stay active Healthy diet        This is a list of the screening recommended for you and due dates:  Health Maintenance  Topic Date Due   Eye exam for diabetics  01/06/2021*   Zoster (Shingles) Vaccine (1 of 2) 02/21/2021*   Hemoglobin A1C  04/06/2021   Complete foot exam   10/04/2021   Colon Cancer Screening  01/12/2024   Tetanus Vaccine  01/22/2028   Flu Shot  Completed   Hepatitis C Screening: USPSTF Recommendation to screen - Ages 18-79 yo.  Completed   HPV Vaccine  Aged Out   COVID-19 Vaccine  Discontinued  *Topic was postponed. The date shown is not the original due date.    Next appointment: Follow up in one year for your annual wellness visit.   Preventive Care 24 Years and Older, Male Preventive care refers to lifestyle choices and visits with your health care provider that can promote health and wellness. What does preventive care include? A yearly physical exam. This is also called an annual well check. Dental exams once or twice a year. Routine eye exams. Ask your health care provider how often you should have your eyes checked. Personal lifestyle  choices, including: Daily care of your teeth and gums. Regular physical activity. Eating a healthy diet. Avoiding tobacco and drug use. Limiting alcohol use. Practicing safe sex. Taking low doses of aspirin every day. Taking vitamin and mineral supplements as recommended by your health care provider. What happens during an annual well check? The services and screenings done by your health care provider during your annual well check will depend on your age, overall health, lifestyle risk factors, and family history of disease. Counseling  Your health care provider may ask you questions about your: Alcohol use. Tobacco use. Drug use. Emotional well-being. Home and relationship well-being. Sexual activity. Eating habits. History of falls. Memory and ability to understand (cognition). Work and work Statistician. Screening  You may have the following tests or measurements: Height, weight, and BMI. Blood pressure. Lipid and cholesterol levels. These may be checked every 5 years, or more frequently if you are over 42 years old. Skin check. Lung cancer screening. You may have this screening every year starting at age 42 if you have a 30-pack-year history of smoking and currently smoke or have quit within the past 15 years. Fecal occult blood test (FOBT) of the stool. You may have this test every year starting at age 46. Flexible sigmoidoscopy or colonoscopy. You may have a sigmoidoscopy every 5 years or a colonoscopy every  10 years starting at age 40. Prostate cancer screening. Recommendations will vary depending on your family history and other risks. Hepatitis C blood test. Hepatitis B blood test. Sexually transmitted disease (STD) testing. Diabetes screening. This is done by checking your blood sugar (glucose) after you have not eaten for a while (fasting). You may have this done every 1-3 years. Abdominal aortic aneurysm (AAA) screening. You may need this if you are a current or  former smoker. Osteoporosis. You may be screened starting at age 61 if you are at high risk. Talk with your health care provider about your test results, treatment options, and if necessary, the need for more tests. Vaccines  Your health care provider may recommend certain vaccines, such as: Influenza vaccine. This is recommended every year. Tetanus, diphtheria, and acellular pertussis (Tdap, Td) vaccine. You may need a Td booster every 10 years. Zoster vaccine. You may need this after age 75. Pneumococcal 13-valent conjugate (PCV13) vaccine. One dose is recommended after age 24. Pneumococcal polysaccharide (PPSV23) vaccine. One dose is recommended after age 8. Talk to your health care provider about which screenings and vaccines you need and how often you need them. This information is not intended to replace advice given to you by your health care provider. Make sure you discuss any questions you have with your health care provider. Document Released: 02/22/2015 Document Revised: 10/16/2015 Document Reviewed: 11/27/2014 Elsevier Interactive Patient Education  2017 [place] Prevention in the Home Falls can cause injuries. They can happen to people of all ages. There are many things you can do to make your home safe and to help prevent falls. What can I do on the outside of my home? Regularly fix the edges of walkways and driveways and fix any cracks. Remove anything that might make you trip as you walk through a door, such as a raised step or threshold. Trim any bushes or trees on the path to your home. Use bright outdoor lighting. Clear any walking paths of anything that might make someone trip, such as rocks or tools. Regularly check to see if handrails are loose or broken. Make sure that both sides of any steps have handrails. Any raised decks and porches should have guardrails on the edges. Have any leaves, snow, or ice cleared regularly. Use sand or salt on walking paths  during winter. Clean up any spills in your garage right away. This includes oil or grease spills. What can I do in the bathroom? Use night lights. Install grab bars by the toilet and in the tub and shower. Do not use towel bars as grab bars. Use non-skid mats or decals in the tub or shower. If you need to sit down in the shower, use a plastic, non-slip stool. Keep the floor dry. Clean up any water that spills on the floor as soon as it happens. Remove soap buildup in the tub or shower regularly. Attach bath mats securely with double-sided non-slip rug tape. Do not have throw rugs and other things on the floor that can make you trip. What can I do in the bedroom? Use night lights. Make sure that you have a light by your bed that is easy to reach. Do not use any sheets or blankets that are too big for your bed. They should not hang down onto the floor. Have a firm chair that has side arms. You can use this for support while you get dressed. Do not have throw rugs and other things on the  floor that can make you trip. What can I do in the kitchen? Clean up any spills right away. Avoid walking on wet floors. Keep items that you use a lot in easy-to-reach places. If you need to reach something above you, use a strong step stool that has a grab bar. Keep electrical cords out of the way. Do not use floor polish or wax that makes floors slippery. If you must use wax, use non-skid floor wax. Do not have throw rugs and other things on the floor that can make you trip. What can I do with my stairs? Do not leave any items on the stairs. Make sure that there are handrails on both sides of the stairs and use them. Fix handrails that are broken or loose. Make sure that handrails are as long as the stairways. Check any carpeting to make sure that it is firmly attached to the stairs. Fix any carpet that is loose or worn. Avoid having throw rugs at the top or bottom of the stairs. If you do have throw  rugs, attach them to the floor with carpet tape. Make sure that you have a light switch at the top of the stairs and the bottom of the stairs. If you do not have them, ask someone to add them for you. What else can I do to help prevent falls? Wear shoes that: Do not have high heels. Have rubber bottoms. Are comfortable and fit you well. Are closed at the toe. Do not wear sandals. If you use a stepladder: Make sure that it is fully opened. Do not climb a closed stepladder. Make sure that both sides of the stepladder are locked into place. Ask someone to hold it for you, if possible. Clearly mark and make sure that you can see: Any grab bars or handrails. First and last steps. Where the edge of each step is. Use tools that help you move around (mobility aids) if they are needed. These include: Canes. Walkers. Scooters. Crutches. Turn on the lights when you go into a dark area. Replace any light bulbs as soon as they burn out. Set up your furniture so you have a clear path. Avoid moving your furniture around. If any of your floors are uneven, fix them. If there are any pets around you, be aware of where they are. Review your medicines with your doctor. Some medicines can make you feel dizzy. This can increase your chance of falling. Ask your doctor what other things that you can do to help prevent falls. This information is not intended to replace advice given to you by your health care provider. Make sure you discuss any questions you have with your health care provider. Document Released: 11/22/2008 Document Revised: 07/04/2015 Document Reviewed: 03/02/2014 Elsevier Interactive Patient Education  2017 [employer].

## 2020-11-21 NOTE — Progress Notes (Signed)
Subjective:   Devin Hanna is a 70 y.o. male who presents for Medicare Annual/Subsequent preventive examination.  Review of Systems    No ROS.  Medicare Wellness    Cardiac Risk Factors include: advanced age (>56mn, >>72women);male gender;diabetes mellitus     Objective:    Today's Vitals   11/21/20 1445  BP: 123/72  Pulse: 69  Resp: 15  SpO2: 96%  Weight: 236 lb 12.8 oz (107.4 kg)  Height: 5' 10"  (1.778 m)   Body mass index is 33.98 kg/m.  Advanced Directives 11/21/2020 10/30/2020 10/16/2020 04/08/2020 07/30/2019 01/26/2019 06/22/2016  Does Patient Have a Medical Advance Directive? No No No No No No No  Would patient like information on creating a medical advance directive? No - Patient declined No - Patient declined No - Patient declined No - Patient declined No - Patient declined No - Patient declined No - Patient declined  Pre-existing out of facility DNR order (yellow form or pink MOST form) - - - - - - -    Current Medications (verified) Outpatient Encounter Medications as of 11/21/2020  Medication Sig   atorvastatin (LIPITOR) 40 MG tablet TAKE ONE TABLET BY MOUTH EVERY DAY   carvedilol (COREG) 12.5 MG tablet Take 1 tablet (12.5 mg total) by mouth 2 (two) times daily.   CINNAMON PO Take 1,000 mg by mouth in the morning and at bedtime.   clopidogrel (PLAVIX) 75 MG tablet TAKE ONE TABLET EVERY MORNING WITH BREAKFAST   dapagliflozin propanediol ([name]) 10 MG TABS tablet Take 1 tablet (10 mg total) by mouth daily before breakfast.   Dulaglutide ([name]) 3 MMH/9.6QISOPN Inject 3 mg as directed once a week.   ferrous sulfate 325 (65 FE) MG tablet Take 162.5 mg by mouth daily with breakfast.   furosemide (LASIX) 40 MG tablet Take 1 tablet (40 mg total) by mouth daily.   gabapentin (NEURONTIN) 300 MG capsule Take 1 capsule (300 mg total) by mouth every morning AND 1 capsule (300 mg total) daily with lunch AND 2 capsules (600 mg total) at bedtime.   mexiletine (MEXITIL)  150 MG capsule Take 1 capsule (150 mg total) by mouth 2 (two) times daily.   Multiple Vitamin (MULTIVITAMIN WITH MINERALS) TABS Take 1 tablet by mouth daily. Multivitamin for Men 50+   nitroGLYCERIN (NITROSTAT) 0.4 MG SL tablet Place 1 tablet (0.4 mg total) under the tongue every 5 (five) minutes as needed for chest pain.   pantoprazole (PROTONIX) 40 MG tablet TAKE 1 TABLET BY MOUTH TWICE DAILY.   sacubitril-valsartan (ENTRESTO) 49-51 MG Take 0.5 tablets by mouth 2 (two) times daily.   No facility-administered encounter medications on file as of 11/21/2020.    Allergies (verified) Patient has no known allergies.   History: Past Medical History:  Diagnosis Date   Acute respiratory failure with hypoxia ([facility] 06/2016   CAD (coronary artery disease)    a. Remote PCI of the RCA;  b. 03/2013 Inf STEMI/PCI: LAD 60-70d, RCA 99p ISR(rota/CBA/DES);  c. 09/2014 MV: inf and infsept infarct, EF 35-40%;  d. 09/2014 Cath: patent RCA stent, stable LAD dzs, EF 40%-->Med rx.   CHF (congestive heart failure) ([facility]    Diabetes mellitus    [place]  GERD (gastroesophageal reflux disease)    HFrEF (heart failure with reduced ejection fraction) ([place]    Hyperlipidemia    Hypertension    Ischemic cardiomyopathy    a. 09/2014 EF 40% by V gram;  b. 06/2016 Echo:  EF 40, diff HK, Gr1 DD, inf HK, no mural thrombus. Mild MR, mildly dil LA, mildly dil RV.    LBBB (left bundle branch block)    Old   Liver mass    Morbid obesity ([place])    Peripheral neuropathy    Upper GI bleed    Vertigo    Past Surgical History:  Procedure Laterality Date   BIV ICD INSERTION CRT-D N/A 04/08/2020   Procedure: BIV ICD INSERTION CRT-D;  Surgeon: Vickie Epley, MD;  Location: [place] [facility];  Service: Cardiovascular;  Laterality: N/A;   CARDIAC CATHETERIZATION N/A 10/03/2014   Procedure: Left Heart Cath and Coronary Angiography;  Surgeon: [name], MD;  Location: [place] [facility];  Service:  Cardiovascular;  Laterality: N/A;   CIRCUMCISION N/A 07/22/2015   Procedure: CIRCUMCISION ADULT;  Surgeon: Hollice Espy, MD;  Location: [facility] OR[facility];  Service: Urology;  Laterality: N/A;   COLONOSCOPY WITH PROPOFOL N/A 01/12/2019   Procedure: COLONOSCOPY WITH PROPOFOL;  Surgeon: Lucilla Lame, MD;  Location: [facility];  Service: Endoscopy;  Laterality: N/A;   CORONARY STENT PLACEMENT     ESOPHAGOGASTRODUODENOSCOPY (EGD) WITH PROPOFOL N/A 09/12/2014   Procedure: ESOPHAGOGASTRODUODENOSCOPY (EGD) WITH PROPOFOL;  Surgeon: Ronald Lobo, MD;  Location: [facility] ENDOSCOPY;  Service: Endoscopy;  Laterality: N/A;   EXTERNAL FIXATION LEG Left 04/03/2012   Procedure: EXTERNAL FIXATION LEG;  Surgeon: Wylene Simmer, MD;  Location: [place];  Service: Orthopedics;  Laterality: Left;   EXTERNAL FIXATION REMOVAL Left 04/14/2012   Procedure: REMOVAL EXTERNAL FIXATION LEG;  Surgeon: Wylene Simmer, MD;  Location: [place];  Service: Orthopedics;  Laterality: Left;   IR 3D INDEPENDENT WKST  10/16/2020   IR 3D INDEPENDENT WKST  10/30/2020   IR ANGIOGRAM SELECTIVE EACH ADDITIONAL VESSEL  10/16/2020   IR ANGIOGRAM SELECTIVE EACH ADDITIONAL VESSEL  10/30/2020   IR ANGIOGRAM VISCERAL SELECTIVE  10/16/2020   IR ANGIOGRAM VISCERAL SELECTIVE  10/30/2020   IR EMBO ARTERIAL NOT HEMORR HEMANG INC GUIDE ROADMAPPING  10/16/2020   IR EMBO TUMOR ORGAN ISCHEMIA INFARCT INC GUIDE ROADMAPPING  10/30/2020   IR RADIOLOGIST EVAL & MGMT  09/06/2020   IR US GUIDE VASC ACCESS RIGHT  10/16/2020   IR US GUIDE VASC ACCESS RIGHT  10/30/2020   IRRIGATION AND DEBRIDEMENT KNEE Left 04/03/2012   Procedure: IRRIGATION AND DEBRIDEMENT KNEE;  Surgeon: Wylene Simmer, MD;  Location: [place];  Service: Orthopedics;  Laterality: Left;   LEFT HEART CATHETERIZATION WITH CORONARY ANGIOGRAM N/A 04/05/2013   Procedure: LEFT HEART CATHETERIZATION WITH CORONARY ANGIOGRAM;  Surgeon: [name], MD;  Location: [place] CATH LAB;  Service: Cardiovascular;  Laterality: N/A;   MASS EXCISION N/A  07/22/2015   Procedure: EXCISION PENILE MASS;  Surgeon: Hollice Espy, MD;  Location: [facility] ORS;  Service: Urology;  Laterality: N/A;   ORIF TIBIA PLATEAU Left 04/14/2012   Procedure: OPEN REDUCTION INTERNAL FIXATION (ORIF) TIBIAL PLATEAU;  Surgeon: Wylene Simmer, MD;  Location: [place];  Service: Orthopedics;  Laterality: Left;   PERCUTANEOUS CORONARY ROTOBLATOR INTERVENTION (PCI-R) N/A 04/06/2013   Procedure: PERCUTANEOUS CORONARY ROTOBLATOR INTERVENTION (PCI-R);  Surgeon: [name], MD;  [facility] C[facility] LAB;  Service: Cardiovascular;  Laterality: N/A;   RIGHT/LEFT HEART CATH AND CORONARY ANGIOGRAPHY N/A 08/21/2019   Procedure: RIGHT/LEFT HEART CATH AND CORONARY ANGIOGRAPHY;  Surgeon: [name], MD;  Location: [place] CV [facility];  Service: Cardiovascular;  Laterality: N/A;   Family History  Problem Relation Age of Onset   Coronary artery disease Father  Coronary artery disease Brother    Lung cancer Brother    Breast cancer Sister    Prostate cancer Brother    Stroke Sister    Social History   Socioeconomic History   Marital status: Divorced    Spouse name: Not on file   Number of children: Not on file   Years of education: Not on file   Highest education level: Not on file  Occupational History   Occupation: heavy Company secretary    Employer: [employer]  Tobacco Use   Smoking status: Former    Packs/day: 3.00    Years: 40.00    Pack years: 120.00    Types: Cigarettes    Quit date: 04/05/1995    Years since quitting: 25.6   Smokeless tobacco: Former   Tobacco comments:    started smoking at age 41  Vaping Use   Vaping Use: Never used  Substance and Sexual Activity   Alcohol use: No    Comment: Occassional Use   Drug use: No   Sexual activity: Not on file  Other Topics Concern   Not on file  Social History Narrative   Heavy Company secretary.; Divorced; quit smoking 25 years; beer rare; by self. Still riding motor cycles.    Social Determinants  of Health   Financial Resource Strain: Medium Risk   Difficulty of Paying Living Expenses: Somewhat hard  Food Insecurity: No Food Insecurity   Worried About Charity fundraiser in the Last Year: Never true   Ran Out of Food in the Last Year: Never true  Transportation Needs: No Transportation Needs   Lack of Transportation (Medical): No   Lack of Transportation (Non-Medical): No  Physical Activity: Not on file  Stress: No Stress Concern Present   Feeling of Stress : Not at all  Social Connections: Unknown   Frequency of Communication with Friends and Family: More than three times a week   Frequency of Social Gatherings with Friends and Family: Not on file   Attends Religious Services: Not on Electrical engineer or Organizations: Not on file   Attends Archivist Meetings: Not on file   Marital Status: Not on file    Tobacco Counseling Counseling given: Not Answered Tobacco comments: started smoking at age 55   Clinical Intake:  Pre-visit preparation completed: Yes        Diabetes: Yes (Followed by PCP and CCM)  How often do you need to have someone help you when you read instructions, pamphlets, or other written materials from your doctor or pharmacy?: 1 - Never  Financial Strains and Diabetes Management: Is the patient seen by Chronic Care Management for management of their diabetes?  Yes   Interpreter Needed?: No      Activities of Daily Living In your present state of health, do you have any difficulty performing the following activities: 11/21/2020 10/30/2020  Hearing? N N  Vision? N N  Difficulty concentrating or making decisions? N N  Walking or climbing stairs? Y N  Comment Injections received L knee -  Dressing or bathing? N N  Doing errands, shopping? N -  Preparing Food and eating ? N -  Using the Toilet? N -  In the past six months, have you accidently leaked urine? N -  Do you have problems with loss of bowel control? N -   Managing your Medications? N -  Managing your Finances? N -  Housekeeping or managing your Housekeeping? N -  Some recent data might be hidden    Patient Care Team: [name], MD as PCP - General (Family Medicine) [name], MD as PCP - Cardiology (Cardiology) Vickie Epley, MD as PCP - Electrophysiology (Cardiology) Clent Jacks, RN as Oncology Nurse Navigator [name], RPH-CPP (Pharmacist)  Indicate any recent Medical Services you may have received from other than [facility] providers in the past year (date may be approximate).     Assessment:   This is a routine wellness examination for Devin Hanna.  Hearing/Vision screen Hearing Screening - Comments:: Patient is able to hear conversational tones without difficulty.  No issues reported. Vision Screening - Comments:: Wears corrective lenses Encouraged to schedule diabetic eye exam   Dietary issues and exercise activities discussed: Current Exercise Habits: Home exercise routine, Intensity: Mild Healthy diet Fluid restriction 2L daily   Goals Addressed             This Visit's Progress    Healthy Lifestyle       Stay active Healthy diet       Depression Screen PHQ 2/9 Scores 11/21/2020 10/10/2019 07/03/2019 04/03/2019 12/06/2018 10/26/2018 08/20/2017  PHQ - 2 Score 0 0 0 0 0 0 -  Exception Documentation - - - - - - Other- indicate reason in comment box  Not completed - - - - - - call completed with daughter    Fall Risk Fall Risk  11/21/2020 01/11/2020 10/10/2019 07/03/2019 04/03/2019  Falls in the past year? 0 0 0 0 0  Number falls in past yr: 0 0 0 0 0  Follow up Falls evaluation completed Falls evaluation completed Falls evaluation completed Falls evaluation completed Falls evaluation completed    FALL RISK PREVENTION PERTAINING TO THE HOME: Home free of loose throw rugs in walkways, pet beds, electrical cords, etc? Yes  Adequate lighting in your home to reduce risk of falls? Yes    ASSISTIVE DEVICES UTILIZED TO PREVENT FALLS:  Life alert? No  Use of a cane, walker or w/c? No  Grab bars in the bathroom? Yes  Shower chair or bench in shower? No  Elevated toilet seat or a handicapped toilet? Yes   TIMED UP AND GO: Was the test performed? Yes .  Length of time to ambulate 10 feet: 10 sec.   Gait steady and fast without use of assistive device  Cognitive Function:  Patient is alert.    6CIT Screen 11/21/2020  What month? 0 points  What time? 0 points  Count back from 20 0 points  Months in reverse 4 points  Repeat phrase 0 points    Immunizations Immunization History  Administered Date(s) Administered   Fluad Quad(high Dose 65+) 10/26/2018, 01/11/2020, 11/21/2020   Influenza, High Dose Seasonal PF 01/09/2016, 10/27/2016, 12/20/2017   Pneumococcal Conjugate-13 01/09/2016   Pneumococcal Polysaccharide-23 09/09/2014   Tdap 01/21/2018   Influenza vaccine- completed today  Covid vaccine discontinued per patient.   Shingrix Completed?: No.    Education has been provided regarding the importance of this vaccine. Patient has been advised to call insurance company to determine out of pocket expense if they have not yet received this vaccine. Advised may also receive vaccine at local pharmacy or Health Dept. Verbalized acceptance and understanding.  Health Maintenance Health Maintenance  Topic Date Due   OPHTHALMOLOGY EXAM  01/06/2021 (Originally 05/27/2018)   Zoster Vaccines- Shingrix (1 of 2) 02/21/2021 (Originally 03/30/1969)   HEMOGLOBIN A1C  04/06/2021   FOOT EXAM  10/04/2021   COLONOSCOPY (  Pts 45-79yr Insurance coverage will need to be confirmed)  01/12/2024   TETANUS/TDAP  01/22/2028   INFLUENZA VACCINE  Completed   Hepatitis C Screening  Completed   HPV VACCINES  Aged Out   COVID-19 Vaccine  Discontinued   Lung Cancer Screening: (Low Dose CT Chest recommended if Age 72-80years, 30 pack-year currently smoking OR have quit w/in 15years.) does  qualify. Completed 10/23/20.  Vision Screening: Recommended annual ophthalmology exams for early detection of glaucoma and other disorders of the eye. Is the patient up to date with their annual eye exam?  No . Agrees to schedule.   Dental Screening: Recommended annual dental exams for proper oral hygiene. Dentures.   Community Resource Referral / Chronic Care Management: CRR required this visit?  No   CCM required this visit?  No      Plan:    I have personally reviewed and noted the following in the patient's chart:   Medical and social history Use of alcohol, tobacco or illicit drugs  Current medications and supplements including opioid prescriptions. Patient is not currently taking opioid prescriptions. Functional ability and status Nutritional status Physical activity Advanced directives List of other physicians Hospitalizations, surgeries, and ER visits in previous 12 months Vitals Screenings to include cognitive, depression, and falls Referrals and appointments  In addition, I have reviewed and discussed with patient certain preventive protocols, quality metrics, and best practice recommendations. A written personalized care plan for preventive services as well as general preventive health recommendations were provided to patient.     OVarney Biles LPN   [id number]

## 2020-11-27 ENCOUNTER — Ambulatory Visit
Admission: RE | Admit: 2020-11-27 | Discharge: 2020-11-27 | Disposition: A | Discharge status: Home / Self Care | Payer: PPO | Source: Ambulatory Visit | Attending: Interventional Radiology | Admitting: Interventional Radiology

## 2020-11-27 ENCOUNTER — Encounter: Payer: Self-pay | Admitting: *Deleted

## 2020-11-27 ENCOUNTER — Telehealth: Payer: Self-pay | Admitting: Pharmacy Technician

## 2020-11-27 DIAGNOSIS — Z596 Low income: Secondary | ICD-10-CM

## 2020-11-27 DIAGNOSIS — C220 Liver cell carcinoma: Secondary | ICD-10-CM

## 2020-11-27 HISTORY — PX: IR RADIOLOGIST EVAL & MGMT: IMG5224

## 2020-11-27 NOTE — Progress Notes (Addendum)
[facility])                                            [place] Team  FOR 2023 RE ENROLLMENT    11/27/2020  Devin Hanna 11/19/1950 [id number]                                      Medication Assistance Referral  Referral From: [facility] Devin T.   Medication/Company: Devin Hanna / [employer] Patient application portion:  Mailed Provider application portion: Faxed  to Devin Hanna in November Provider address/fax verified via: Office website  Medication/Company: Devin Hanna / Ralph [employer] Patient application portion:  Mailed Provider application portion:  N/A Embedded [name] Devin Hanna  to have Devin Hanna sign in clinic in November Provider address/fax verified via: Office website  Medication/Company: Devin Hanna / [employer] Patient application portion:  N/A Per [name] will automatically be re enrolled in 3329 Provider application portion:  N/A Embedded [name] Devin Hanna escribed to [employer] on 11/19/20  to Devin Hanna Provider address/fax verified via: Office website    Elester Apodaca P. Sorren Vallier, [place]  [phone]

## 2020-11-27 NOTE — Progress Notes (Signed)
Referring Physician(s): Stark Klein, MD  Reason for follow up: 1 month s/p right lobe TARE  History of present illness: 70 year old male with history of NASH cirrhosis Ardine Eng Pugh A) and enlarging multifocal, unilobar right hepatocellular carcinoma status post right lobe radioembolization (34.2 mCi).  He has done very well since the procedure.  Healed right groin puncture site.  No abdominal pain, nausea, vomiting, jaundice, loss of appetite or energy.  He has no specific complaints today.   Past Medical History:  Diagnosis Date   Acute respiratory failure with hypoxia ([place]) 06/2016   CAD (coronary artery disease)    a. Remote PCI of the RCA;  b. 03/2013 Inf STEMI/PCI: LAD 60-70d, RCA 99p ISR(rota/CBA/DES);  c. 09/2014 MV: inf and infsept infarct, EF 35-40%;  d. 09/2014 Cath: patent RCA stent, stable LAD dzs, EF 40%-->Med rx.   CHF (congestive heart failure) ([place])    Diabetes mellitus    sees Dr. Hardin Negus in [place]   GERD (gastroesophageal reflux disease)    HFrEF (heart failure with reduced ejection fraction) ([place])    Hyperlipidemia    Hypertension    Ischemic cardiomyopathy    a. 09/2014 EF 40% by V gram;  b. 06/2016 Echo: EF 40, diff HK, Gr1 DD, inf HK, no mural thrombus. Mild MR, mildly dil LA, mildly dil RV.    LBBB (left bundle branch block)    Old   Liver mass    Morbid obesity ([place])    Peripheral neuropathy    Upper GI bleed    Vertigo     Past Surgical History:  Procedure Laterality Date   BIV ICD INSERTION CRT-D N/A 04/08/2020   Procedure: BIV ICD INSERTION CRT-D;  Surgeon: Vickie Epley, MD;  Location: [place] [facility];  Service: Cardiovascular;  Laterality: N/A;   CARDIAC CATHETERIZATION N/A 10/03/2014   Procedure: Left Heart Cath and Coronary Angiography;  Surgeon: [name], MD;  Location: [place];  Service: Cardiovascular;  Laterality: N/A;   CIRCUMCISION N/A 07/22/2015   Procedure: CIRCUMCISION ADULT;  Surgeon: Hollice Espy, MD;   Location: [facility];  Service: Urology;  Laterality: N/A;   COLONOSCOPY WITH PROPOFOL N/A 01/12/2019   Procedure: COLONOSCOPY WITH PROPOFOL;  Surgeon: Lucilla Lame, MD;  Location: [facility] ENDOSCOPY;  Service: Endoscopy;  Laterality: N/A;   CORONARY STENT PLACEMENT     ESOPHAGOGASTRODUODENOSCOPY (EGD) WITH PROPOFOL N/A 09/12/2014   Procedure: ESOPHAGOGASTRODUODENOSCOPY (EGD) WITH PROPOFOL;  Surgeon: Ronald Lobo, MD;  Location: [facility]COPY;  Service: Endoscopy;  Laterality: N/A;   EXTERNAL FIXATION LEG Left 04/03/2012   Procedure: EXTERNAL FIXATION LEG;  Surgeon: Wylene Simmer, MD;  Location: [place];  Service: Orthopedics;  Laterality: Left;   EXTERNAL FIXATION REMOVAL Left 04/14/2012   Procedure: REMOVAL EXTERNAL FIXATION LEG;  Surgeon: Wylene Simmer, MD;  Location: [place];  Service: Orthopedics;  Laterality: Left;   IR 3D INDEPENDENT WKST  10/16/2020   IR 3D INDEPENDENT WKST  10/30/2020   IR ANGIOGRAM SELECTIVE EACH ADDITIONAL VESSEL  10/16/2020   IR ANGIOGRAM SELECTIVE EACH ADDITIONAL VESSEL  10/30/2020   IR ANGIOGRAM VISCERAL SELECTIVE  10/16/2020   IR ANGIOGRAM VISCERAL SELECTIVE  10/30/2020   IR EMBO ARTERIAL NOT HEMORR HEMANG INC GUIDE ROADMAPPING  10/16/2020   IR EMBO TUMOR ORGAN ISCHEMIA INFARCT INC GUIDE ROADMAPPING  10/30/2020   IR RADIOLOGIST EVAL & MGMT  09/06/2020   IR US GUIDE VASC ACCESS RIGHT  10/16/2020   IR US GUIDE VASC ACCESS RIGHT  10/30/2020  IRRIGATION AND DEBRIDEMENT KNEE Left 04/03/2012   Procedure: IRRIGATION AND DEBRIDEMENT KNEE;  Surgeon: Wylene Simmer, MD;  Location: [place];  Service: Orthopedics;  Laterality: Left;   LEFT HEART CATHETERIZATION WITH CORONARY ANGIOGRAM N/A 04/05/2013   Procedure: LEFT HEART CATHETERIZATION WITH CORONARY ANGIOGRAM;  Surgeon: [name], MD;  Location: [place] C[facility];  Service: Cardiovascular;  Laterality: N/A;   MASS EXCISION N/A 07/22/2015   Procedure: EXCISION PENILE MASS;  Surgeon: Hollice Espy, MD;  Location: [facility];  Service: Urology;  Laterality: N/A;    ORIF TIBIA PLATEAU Left 04/14/2012   Procedure: OPEN REDUCTION INTERNAL FIXATION (ORIF) TIBIAL PLATEAU;  Surgeon: Wylene Simmer, MD;  Location: [place];  Service: Orthopedics;  Laterality: Left;   PERCUTANEOUS CORONARY ROTOBLATOR INTERVENTION (PCI-R) N/A 04/06/2013   Procedure: PERCUTANEOUS CORONARY ROTOBLATOR INTERVENTION (PCI-R);  Surgeon: [name], MD;  Location: [facility] CATH LAB;  Service: Cardiovascular;  Laterality: N/A;   RIGHT/LEFT HEART CATH AND CORONARY ANGIOGRAPHY N/A 08/21/2019   Procedure: RIGHT/LEFT HEART CATH AND CORONARY ANGIOGRAPHY;  Surgeon: [name], MD;  Location: [place] [facility];  Service: Cardiovascular;  Laterality: N/A;    Allergies: Patient has no known allergies.  Medications: Prior to Admission medications   Medication Sig Start Date End Date Taking? Authorizing Provider  atorvastatin (LIPITOR) 40 MG tablet TAKE ONE TABLET BY MOUTH EVERY DAY 11/15/20   Theora Gianotti, NP  carvedilol (COREG) 12.5 MG tablet Take 1 tablet (12.5 mg total) by mouth 2 (two) times daily. 10/23/20   Vickie Epley, MD  CINNAMON PO Take 1,000 mg by mouth in the morning and at bedtime.    [provider]  clopidogrel (PLAVIX) 75 MG tablet TAKE ONE TABLET EVERY MORNING WITH BREAKFAST 10/17/20   [name], MD  dapagliflozin propanediol ([name]) 10 MG TABS tablet Take 1 tablet (10 mg total) by mouth daily before breakfast. 11/19/20   [name], MD  Dulaglutide ([name]) 3 VH/8.4ON SOPN Inject 3 mg as directed once a week. 01/25/20   [name], MD  ferrous sulfate 325 (65 FE) MG tablet Take 162.5 mg by mouth daily with breakfast.    [provider]  furosemide (LASIX) 40 MG tablet Take 1 tablet (40 mg total) by mouth daily. 10/24/20   [name], MD  gabapentin (NEURONTIN) 300 MG capsule Take 1 capsule (300 mg total) by mouth every morning AND 1 capsule (300 mg total) daily with lunch AND 2 capsules (600 mg total) at  bedtime. 10/04/20   [name], MD  mexiletine (MEXITIL) 150 MG capsule Take 1 capsule (150 mg total) by mouth 2 (two) times daily. 10/23/20   Vickie Epley, MD  Multiple Vitamin (MULTIVITAMIN WITH MINERALS) TABS Take 1 tablet by mouth daily. Multivitamin for Men 50+    [provider]  nitroGLYCERIN (NITROSTAT) 0.4 MG SL tablet Place 1 tablet (0.4 mg total) under the tongue every 5 (five) minutes as needed for chest pain. 05/16/20   [name], MD  pantoprazole (PROTONIX) 40 MG tablet TAKE 1 TABLET BY MOUTH TWICE DAILY. 10/17/20   [name], MD  sacubitril-valsartan (ENTRESTO) 49-51 MG Take 0.5 tablets by mouth 2 (two) times daily.    [provider]     Family History  Problem Relation Age of Onset   Coronary artery disease Father    Coronary artery disease Brother    Lung cancer Brother    Breast cancer Sister    Prostate cancer  Brother    Stroke Sister     Social History   Socioeconomic History   Marital status: Divorced    Spouse name: Not on file   Number of children: Not on file   Years of education: Not on file   Highest education level: Not on file  Occupational History   Occupation: heavy Company secretary    Employer: [employer]  Tobacco Use   Smoking status: Former    Packs/day: 3.00    Years: 40.00    Pack years: 120.00    Types: Cigarettes    Quit date: 04/05/1995    Years since quitting: 25.6   Smokeless tobacco: Former   Tobacco comments:    started smoking at age 58  Vaping Use   Vaping Use: Never used  Substance and Sexual Activity   Alcohol use: No    Comment: Occassional Use   Drug use: No   Sexual activity: Not on file  Other Topics Concern   Not on file  Social History Narrative   Heavy Company secretary.; Divorced; quit smoking 25 years; beer rare; by self. Still riding motor cycles.    Social Determinants of Health   Financial Resource Strain: Medium Risk   Difficulty of Paying Living  Expenses: Somewhat hard  Food Insecurity: No Food Insecurity   Worried About Charity fundraiser in the Last Year: Never true   Ran Out of Food in the Last Year: Never true  Transportation Needs: No Transportation Needs   Lack of Transportation (Medical): No   Lack of Transportation (Non-Medical): No  Physical Activity: Not on file  Stress: No Stress Concern Present   Feeling of Stress : Not at all  Social Connections: Unknown   Frequency of Communication with Friends and Family: More than three times a week   Frequency of Social Gatherings with Friends and Family: Not on file   Attends Religious Services: Not on file   Active Member of Clubs or Organizations: Not on file   Attends Archivist Meetings: Not on file   Marital Status: Not on file     Vital Signs: BP 127/66 (BP Location: Right Arm)   Pulse 71   SpO2 98%   Physical Exam Constitutional:      General: He is not in acute distress. HENT:     Head: Normocephalic.     Mouth/Throat:     Mouth: Mucous membranes are moist.  Eyes:     General: No scleral icterus. Cardiovascular:     Rate and Rhythm: Normal rate and regular rhythm.  Pulmonary:     Breath sounds: Normal breath sounds.  Abdominal:     General: There is no distension.     Tenderness: There is no abdominal tenderness.  Musculoskeletal:        General: No swelling.  Skin:    General: Skin is warm and dry.     Coloration: Skin is not jaundiced.  Neurological:     Mental Status: He is alert and oriented to person, place, and time.    Imaging: No results found.  Labs:  CBC: Recent Labs    07/05/20 1503 08/21/20 1028 10/16/20 0723 10/30/20 0740  WBC 6.9 6.8 5.8 8.3  HGB 14.8 14.8 15.0 15.2  HCT 43.0 43.1 44.6 45.9  PLT 201 162 154 172    COAGS: Recent Labs    10/16/20 0723 10/30/20 0740  INR 1.1 1.1  APTT 31  --     BMP: Recent Labs  08/01/20 1129 08/21/20 1028 10/04/20 0901 10/16/20 0723 10/23/20 1421  10/30/20 0740  NA 137 136 138 137 140 140  K 4.3 4.2 4.2 4.0 4.2 3.9  CL 103 101 101 104 103 102  CO2 25 25 26 24 22 24   GLUCOSE 131* 131* 114* 127* 124* 131*  BUN 15 25* 17 16 16 21   CALCIUM 8.9 8.8* 9.2 8.8* 8.9 8.8*  CREATININE 1.22 1.70* 1.47 1.29* 1.22 1.46*  GFRNONAA >60 42*  --  59*  --  51*    LIVER FUNCTION TESTS: Recent Labs    07/05/20 1503 07/29/20 0000 08/21/20 1028 10/16/20 0723 10/30/20 0740  BILITOT 0.6  --  1.0 1.0 1.6*  AST 40  --  33 48* 45*  ALT 36  --  25 33 36  ALKPHOS 165* 153* 116 125 137*  PROT 6.6  --  7.1 6.2* 6.9  ALBUMIN 3.8  --  3.6 3.4* 3.6    Assessment and Plan: 70 year old male with history of NASH cirrhosis Ardine Eng Pugh A) and enlarging multifocal, unilobar right hepatocellular carcinoma status post right lobe radioembolization (34.2 mCi).  He has done very well since the procedure.   -obtain CBC, CMP, INR, and a-FP as outpatient soon -follow up in 2-3 months with MRI abdomen to assess treatment response  Electronically Signed: Rosanne Ashing Akeen Ledyard 11/27/2020, 9:15 AM   I spent a total of 25 Minutes in face to face in clinical consultation, greater than 50% of which was counseling/coordinating care for hepatocellular carcinoma.

## 2020-11-28 ENCOUNTER — Other Ambulatory Visit: Payer: Self-pay | Admitting: *Deleted

## 2020-11-28 DIAGNOSIS — C220 Liver cell carcinoma: Secondary | ICD-10-CM

## 2020-12-04 ENCOUNTER — Telehealth: Payer: Self-pay | Admitting: Cardiovascular Disease

## 2020-12-04 NOTE — Telephone Encounter (Signed)
Fax received from Eva at [facility] with the physician portion of the patient assistance application for Entresto to be completed.  Reviewed the patient's chart and his dose is listed as: Entresto 49/51 mg- take 0.5 tablet BID  Will confirm with Dr. Fletcher Anon that we can complete the application with: Entresto 24/26 mg- take 1 tablet by mouth BID  Application placed on Dr. Tyrell Antonio desk with a note asking to clarify how he wants the RX to appear on the application.

## 2020-12-06 MED ORDER — ENTRESTO 24-26 MG PO TABS: 1.0000 | ORAL_TABLET | Freq: Two times a day (BID) | ORAL | Status: AC

## 2020-12-06 NOTE — Telephone Encounter (Signed)
Dr. Fletcher Anon has signed the patient assistance application for the patient's Entresto.  He did not clarify regarding the RX for entresto 49/51- take 0.5 tablet BID vs entresto 24/26 mg- take 1 tablet BID.  As we do not typically write the prescription to take 0.5 tablets, I have changed the order to read for Entresto 24/26 mg- take 1 tablet by mouth twice daily.  Form completed and faxing to [place] at [phone].  Fax confirmation received.    Copy of paperwork placed in the file for assistance applications.

## 2020-12-09 DIAGNOSIS — E785 Hyperlipidemia, unspecified: Secondary | ICD-10-CM | POA: Diagnosis not present

## 2020-12-09 DIAGNOSIS — E1142 Type 2 diabetes mellitus with diabetic polyneuropathy: Secondary | ICD-10-CM | POA: Diagnosis not present

## 2020-12-09 DIAGNOSIS — I25118 Atherosclerotic heart disease of native coronary artery with other forms of angina pectoris: Secondary | ICD-10-CM | POA: Diagnosis not present

## 2020-12-09 DIAGNOSIS — I10 Essential (primary) hypertension: Secondary | ICD-10-CM

## 2020-12-09 DIAGNOSIS — I5022 Chronic systolic (congestive) heart failure: Secondary | ICD-10-CM | POA: Diagnosis not present

## 2020-12-16 DIAGNOSIS — C220 Liver cell carcinoma: Secondary | ICD-10-CM | POA: Diagnosis not present

## 2020-12-17 LAB — CBC
HCT: 45.1 % (ref 38.5–50.0)
Hemoglobin: 15 g/dL (ref 13.2–17.1)
MCH: 33 pg (ref 27.0–33.0)
MCHC: 33.3 g/dL (ref 32.0–36.0)
MCV: 99.1 fL (ref 80.0–100.0)
MPV: 10 fL (ref 7.5–12.5)
Platelets: 140 10*3/uL (ref 140–400)
RBC: 4.55 10*6/uL (ref 4.20–5.80)
RDW: 12.5 % (ref 11.0–15.0)
WBC: 7.5 10*3/uL (ref 3.8–10.8)

## 2020-12-17 LAB — PROTIME-INR

## 2020-12-17 LAB — COMPLETE METABOLIC PANEL WITH GFR
AG Ratio: 1.3 (calc) (ref 1.0–2.5)
ALT: 34 U/L (ref 9–46)
AST: 47 U/L — ABNORMAL HIGH (ref 10–35)
Albumin: 3.5 g/dL — ABNORMAL LOW (ref 3.6–5.1)
Alkaline phosphatase (APISO): 220 U/L — ABNORMAL HIGH (ref 35–144)
BUN/Creatinine Ratio: 12 (calc) (ref 6–22)
BUN: 17 mg/dL (ref 7–25)
CO2: 26 mmol/L (ref 20–32)
Calcium: 8.8 mg/dL (ref 8.6–10.3)
Chloride: 106 mmol/L (ref 98–110)
Creat: 1.43 mg/dL — ABNORMAL HIGH (ref 0.70–1.28)
Globulin: 2.7 g/dL (calc) (ref 1.9–3.7)
Glucose, Bld: 124 mg/dL — ABNORMAL HIGH (ref 65–99)
Potassium: 4.1 mmol/L (ref 3.5–5.3)
Sodium: 142 mmol/L (ref 135–146)
Total Bilirubin: 1.3 mg/dL — ABNORMAL HIGH (ref 0.2–1.2)
Total Protein: 6.2 g/dL (ref 6.1–8.1)
eGFR: 52 mL/min/{1.73_m2} — ABNORMAL LOW (ref >=60)

## 2020-12-17 LAB — AFP TUMOR MARKER: AFP-Tumor Marker: 13.8 ng/mL — ABNORMAL HIGH (ref <6.1)

## 2020-12-25 ENCOUNTER — Other Ambulatory Visit: Payer: Self-pay | Admitting: Interventional Radiology

## 2020-12-25 ENCOUNTER — Other Ambulatory Visit (HOSPITAL_COMMUNITY): Payer: Self-pay | Admitting: Interventional Radiology

## 2020-12-25 DIAGNOSIS — C220 Liver cell carcinoma: Secondary | ICD-10-CM

## 2020-12-31 ENCOUNTER — Telehealth: Payer: Self-pay | Admitting: Pharmacy Technician

## 2020-12-31 DIAGNOSIS — Z596 Low income: Secondary | ICD-10-CM

## 2020-12-31 NOTE — Progress Notes (Signed)
[facility])                                            [place] Team    12/31/2020  [place] April 12, 1950 [id number]  Received both patient and provider portion(s) of patient assistance application(s) for Entresto and [name]. Faxed completed application and required documents into [name] and [employer] respectively. Embedded [name] escribed updated RX for Wilder Glade to [name] for 2023 enrollment.    Teaira Croft P. [place], [place]  [phone]

## 2021-01-06 ENCOUNTER — Ambulatory Visit: Payer: PPO | Admitting: Family Medicine

## 2021-01-06 ENCOUNTER — Ambulatory Visit (HOSPITAL_COMMUNITY)
Admission: RE | Admit: 2021-01-06 | Discharge: 2021-01-06 | Disposition: A | Discharge status: Home / Self Care | Payer: PPO | Source: Ambulatory Visit | Attending: Interventional Radiology | Admitting: Interventional Radiology

## 2021-01-06 ENCOUNTER — Other Ambulatory Visit: Payer: Self-pay

## 2021-01-06 DIAGNOSIS — K769 Liver disease, unspecified: Secondary | ICD-10-CM | POA: Diagnosis not present

## 2021-01-06 DIAGNOSIS — C220 Liver cell carcinoma: Secondary | ICD-10-CM | POA: Insufficient documentation

## 2021-01-06 DIAGNOSIS — K8020 Calculus of gallbladder without cholecystitis without obstruction: Secondary | ICD-10-CM | POA: Diagnosis not present

## 2021-01-06 DIAGNOSIS — K7460 Unspecified cirrhosis of liver: Secondary | ICD-10-CM | POA: Diagnosis not present

## 2021-01-06 MED ORDER — GADOBUTROL 1 MMOL/ML IV SOLN
10.0000 mL | Freq: Once | INTRAVENOUS | Status: AC | PRN
  Administered 2021-01-06: 12:00:00 10 mL via INTRAVENOUS

## 2021-01-07 ENCOUNTER — Ambulatory Visit (INDEPENDENT_AMBULATORY_CARE_PROVIDER_SITE_OTHER): Payer: PPO

## 2021-01-07 DIAGNOSIS — I5022 Chronic systolic (congestive) heart failure: Secondary | ICD-10-CM | POA: Diagnosis not present

## 2021-01-07 LAB — CUP PACEART REMOTE DEVICE CHECK
Battery Remaining Longevity: 77 mo
Battery Remaining Percentage: 86 %
Battery Voltage: 2.99 V
Brady Statistic AP VP Percent: 8.3 %
Brady Statistic AP VS Percent: 1 %
Brady Statistic AS VP Percent: 90 %
Brady Statistic AS VS Percent: 1.3 %
Brady Statistic RA Percent Paced: 3.6 %
Date Time Interrogation Session: 20221129010014
HighPow Impedance: 70 Ohm
Implantable Lead Implant Date: 20220228
Implantable Lead Implant Date: 20220228
Implantable Lead Implant Date: 20220228
Implantable Lead Location: 753858
Implantable Lead Location: 753859
Implantable Lead Location: 753860
Implantable Pulse Generator Implant Date: 20220228
Lead Channel Impedance Value: 1150 Ohm
Lead Channel Impedance Value: 440 Ohm
Lead Channel Impedance Value: 550 Ohm
Lead Channel Pacing Threshold Amplitude: 0.75 V
Lead Channel Pacing Threshold Amplitude: 1.5 V
Lead Channel Pacing Threshold Amplitude: 3.125 V
Lead Channel Pacing Threshold Pulse Width: 0.5 ms
Lead Channel Pacing Threshold Pulse Width: 0.5 ms
Lead Channel Pacing Threshold Pulse Width: 0.5 ms
Lead Channel Sensing Intrinsic Amplitude: 0.5 mV
Lead Channel Sensing Intrinsic Amplitude: 12 mV
Lead Channel Setting Pacing Amplitude: 1.75 V
Lead Channel Setting Pacing Amplitude: 2.5 V
Lead Channel Setting Pacing Amplitude: 3.625
Lead Channel Setting Pacing Pulse Width: 0.5 ms
Lead Channel Setting Pacing Pulse Width: 0.5 ms
Lead Channel Setting Sensing Sensitivity: 0.5 mV
Pulse Gen Serial Number: 810018869

## 2021-01-16 NOTE — Progress Notes (Signed)
Remote ICD transmission.   

## 2021-01-17 ENCOUNTER — Other Ambulatory Visit (HOSPITAL_COMMUNITY): Payer: PPO

## 2021-01-21 NOTE — Progress Notes (Signed)
Electrophysiology Office Follow up Visit Note:    Date:  01/22/2021   ID:  Devin Hanna, DOB 1951-02-06, MRN [id number]  PCP:  [name], MD  Early Cardiologist:  [name], MD  [facility] Electrophysiologist:  Vickie Epley, MD    Interval History:    Devin Hanna is a 70 y.o. male who presents for a follow up visit. They were last seen in clinic October 23, 2020.  He has a biventricular ICD.  At the time of our last appointment he had recently received a shock from his ICD.  He has permanent atrial fibrillation.  I started him on mexiletine at her last appointment.  Device checks since that appointment is shown stable lead function.  He BiV paces 96% of the time.  He has atrial fibrillation but is not anticoagulated because of a history of splenic varices and active malignancy.  This is been previously discussed at length with the patient and his daughter.  See previous A. fib clinic note with Adline Peals.       Past Medical History:  Diagnosis Date   Acute respiratory failure with hypoxia ([place]) 06/2016   CAD (coronary artery disease)    a. Remote PCI of the RCA;  b. 03/2013 Inf STEMI/PCI: LAD 60-70d, RCA 99p ISR(rota/CBA/DES);  c. 09/2014 MV: inf and infsept infarct, EF 35-40%;  d. 09/2014 Cath: patent RCA stent, stable LAD dzs, EF 40%-->Med rx.   CHF (congestive heart failure) ([place])    Diabetes mellitus    sees Dr. Hardin Negus in [place]   GERD (gastroesophageal reflux disease)    HFrEF (heart failure with reduced ejection fraction) ([place])    Hyperlipidemia    Hypertension    Ischemic cardiomyopathy    a. 09/2014 EF 40% by V gram;  b. 06/2016 Echo: EF 40, diff HK, Gr1 DD, inf HK, no mural thrombus. Mild MR, mildly dil LA, mildly dil RV.    LBBB (left bundle branch block)    Old   Liver mass    Morbid obesity ([place])    Peripheral neuropathy    Upper GI bleed    Vertigo     Past Surgical History:  Procedure Laterality Date   BIV ICD  INSERTION CRT-D N/A 04/08/2020   Procedure: BIV ICD INSERTION CRT-D;  Surgeon: Vickie Epley, MD;  Location: [place] [facility];  Service: Cardiovascular;  Laterality: N/A;   CARDIAC CATHETERIZATION N/A 10/03/2014   Procedure: Left Heart Cath and Coronary Angiography;  Surgeon: [name], MD;  Location: [place] [facility];  Service: Cardiovascular;  Laterality: N/A;   CIRCUMCISION N/A 07/22/2015   Procedure: CIRCUMCISION ADULT;  Surgeon: Hollice Espy, MD;  Location: [facility];  Service: Urology;  Laterality: N/A;   COLONOSCOPY WITH PROPOFOL N/A 01/12/2019   Procedure: COLONOSCOPY WITH PROPOFOL;  Surgeon: Lucilla Lame, MD;  Location: [facility] ENDOSCOPY;  Service: Endoscopy;  Laterality: N/A;   CORONARY STENT PLACEMENT     ESOPHAGOGASTRODUODENOSCOPY (EGD) WITH PROPOFOL N/A 09/12/2014   Procedure: ESOPHAGOGASTRODUODENOSCOPY (EGD) WITH PROPOFOL;  Surgeon: Ronald Lobo, MD;  Location: [facility] ENDOSCOPY;  Service: Endoscopy;  Laterality: N/A;   EXTERNAL FIXATION LEG Left 04/03/2012   Procedure: EXTERNAL FIXATION LEG;  Surgeon: Wylene Simmer, MD;  Location: [place];  Service: Orthopedics;  Laterality: Left;   EXTERNAL FIXATION REMOVAL Left 04/14/2012   Procedure: REMOVAL EXTERNAL FIXATION LEG;  Surgeon: Wylene Simmer, MD;  Location: [place];  Service: Orthopedics;  Laterality: Left;   IR 3D INDEPENDENT WKST  10/16/2020   IR 3D INDEPENDENT WKST  10/30/2020   IR ANGIOGRAM SELECTIVE EACH ADDITIONAL VESSEL  10/16/2020   IR ANGIOGRAM SELECTIVE EACH ADDITIONAL VESSEL  10/30/2020   IR ANGIOGRAM VISCERAL SELECTIVE  10/16/2020   IR ANGIOGRAM VISCERAL SELECTIVE  10/30/2020   IR EMBO ARTERIAL NOT HEMORR HEMANG INC GUIDE ROADMAPPING  10/16/2020   IR EMBO TUMOR ORGAN ISCHEMIA INFARCT INC GUIDE ROADMAPPING  10/30/2020   IR RADIOLOGIST EVAL & MGMT  09/06/2020   IR RADIOLOGIST EVAL & MGMT  11/27/2020   IR US GUIDE VASC ACCESS RIGHT  10/16/2020   IR US GUIDE VASC ACCESS RIGHT  10/30/2020   IRRIGATION AND DEBRIDEMENT KNEE Left 04/03/2012    Procedure: IRRIGATION AND DEBRIDEMENT KNEE;  Surgeon: Wylene Simmer, MD;  Location: [place];  Service: Orthopedics;  Laterality: Left;   LEFT HEART CATHETERIZATION WITH CORONARY ANGIOGRAM N/A 04/05/2013   Procedure: LEFT HEART CATHETERIZATION WITH CORONARY ANGIOGRAM;  Surgeon: [name], MD;  Location: [place] [facility] LAB;  Service: Cardiovascular;  Laterality: N/A;   MASS EXCISION N/A 07/22/2015   Procedure: EXCISION PENILE MASS;  Surgeon: Hollice Espy, MD;  Location: [facility];  Service: Urology;  Laterality: N/A;   ORIF TIBIA PLATEAU Left 04/14/2012   Procedure: OPEN REDUCTION INTERNAL FIXATION (ORIF) TIBIAL PLATEAU;  Surgeon: Wylene Simmer, MD;  Location: [facility];  Service: Orthopedics;  Laterality: Left;   PERCUTANEOUS CORONARY ROTOBLATOR INTERVENTION (PCI-R) N/A 04/06/2013   Procedure: PERCUTANEOUS CORONARY ROTOBLATOR INTERVENTION (PCI-R);  Surgeon: [name], MD;  Location: [facility] CATH LAB;  Service: Cardiovascular;  Laterality: N/A;   RIGHT/LEFT HEART CATH AND CORONARY ANGIOGRAPHY N/A 08/21/2019   Procedure: RIGHT/LEFT HEART CATH AND CORONARY ANGIOGRAPHY;  Surgeon: [name], MD;  Location: [place] [facility];  Service: Cardiovascular;  Laterality: N/A;    Current Medications: Current Meds  Medication Sig   atorvastatin (LIPITOR) 40 MG tablet TAKE ONE TABLET BY MOUTH EVERY DAY   carvedilol (COREG) 12.5 MG tablet Take 1 tablet (12.5 mg total) by mouth 2 (two) times daily.   CINNAMON PO Take 1,000 mg by mouth in the morning and at bedtime.   clopidogrel (PLAVIX) 75 MG tablet TAKE ONE TABLET EVERY MORNING WITH BREAKFAST   dapagliflozin propanediol ([name]) 10 MG TABS tablet Take 1 tablet (10 mg total) by mouth daily before breakfast.   Dulaglutide ([name]) 3 YQ/0.3KV SOPN Inject 3 mg as directed once a week.   ferrous sulfate 325 (65 FE) MG tablet Take 162.5 mg by mouth daily with breakfast.   furosemide (LASIX) 40 MG tablet Take 1 tablet (40 mg total) by mouth daily.   gabapentin  (NEURONTIN) 300 MG capsule Take 1 capsule (300 mg total) by mouth every morning AND 1 capsule (300 mg total) daily with lunch AND 2 capsules (600 mg total) at bedtime.   mexiletine (MEXITIL) 150 MG capsule Take 1 capsule (150 mg total) by mouth 2 (two) times daily.   Multiple Vitamin (MULTIVITAMIN WITH MINERALS) TABS Take 1 tablet by mouth daily. Multivitamin for Men 50+   nitroGLYCERIN (NITROSTAT) 0.4 MG SL tablet Place 1 tablet (0.4 mg total) under the tongue every 5 (five) minutes as needed for chest pain.   pantoprazole (PROTONIX) 40 MG tablet TAKE 1 TABLET BY MOUTH TWICE DAILY.   sacubitril-valsartan (ENTRESTO) 24-26 MG Take 1 tablet by mouth 2 (two) times daily. (Patient taking differently: Take 0.5 tablets by mouth 2 (two) times daily.)     Allergies:   Patient has no known allergies.   Social  History   Socioeconomic History   Marital status: Divorced    Spouse name: Not on file   Number of children: Not on file   Years of education: Not on file   Highest education level: Not on file  Occupational History   Occupation: heavy Company secretary    Employer: [employer]  Tobacco Use   Smoking status: Former    Packs/day: 3.00    Years: 40.00    Pack years: 120.00    Types: Cigarettes    Quit date: 04/05/1995    Years since quitting: 25.8   Smokeless tobacco: Former   Tobacco comments:    started smoking at age 33  Vaping Use   Vaping Use: Never used  Substance and Sexual Activity   Alcohol use: No    Comment: Occassional Use   Drug use: No   Sexual activity: Not on file  Other Topics Concern   Not on file  Social History Narrative   Heavy Company secretary.; Divorced; quit smoking 25 years; beer rare; by self. Still riding motor cycles.    Social Determinants of Health   Financial Resource Strain: Medium Risk   Difficulty of Paying Living Expenses: Somewhat hard  Food Insecurity: No Food Insecurity   Worried About Charity fundraiser in the Last Year: Never  true   Ran Out of Food in the Last Year: Never true  Transportation Needs: No Transportation Needs   Lack of Transportation (Medical): No   Lack of Transportation (Non-Medical): No  Physical Activity: Not on file  Stress: No Stress Concern Present   Feeling of Stress : Not at all  Social Connections: Unknown   Frequency of Communication with Friends and Family: More than three times a week   Frequency of Social Gatherings with Friends and Family: Not on file   Attends Religious Services: Not on file   Active Member of Clubs or Organizations: Not on file   Attends Archivist Meetings: Not on file   Marital Status: Not on file     Family History: The patient's family history includes Breast cancer in his sister; Coronary artery disease in his brother and father; Lung cancer in his brother; Prostate cancer in his brother; Stroke in his sister.  ROS:   Please see the history of present illness.    All other systems reviewed and are negative.  EKGs/Labs/Other Studies Reviewed:    The following studies were reviewed today:  January 22, 2021 in clinic device interrogation personally reviewed Stable lead parameters Battery longevity 6.4 years BiV pacing 96% In atrial fibrillation Changed atrial sensitivity and LV pulse width during today's interrogation  EKG:  The ekg ordered today demonstrates atrial fibrillation and biventricular pacing  Recent Labs: 07/05/2020: TSH 3.420 10/23/2020: Magnesium 2.1 12/16/2020: ALT 34; BUN 17; Creat 1.43; Hemoglobin 15.0; Platelets 140; Potassium 4.1; Sodium 142  Recent Lipid Panel    Component Value Date/Time   CHOL 103 06/26/2020 0816   CHOL 103 06/07/2014 0824   TRIG 159.0 (H) 06/26/2020 0816   HDL 34.50 (L) 06/26/2020 0816   HDL 38 (L) 06/07/2014 0824   CHOLHDL 3 06/26/2020 0816   VLDL 31.8 06/26/2020 0816   LDLCALC 37 06/26/2020 0816   LDLCALC 59 03/30/2018 0930    Physical Exam:    VS:  BP 130/70    Pulse 70    Ht 5' 10"   (1.778 m)    Wt 236 lb 9.6 oz (107.3 kg)    SpO2 96%  BMI 33.95 kg/m     Wt Readings from Last 3 Encounters:  01/22/21 236 lb 9.6 oz (107.3 kg)  11/21/20 236 lb 12.8 oz (107.4 kg)  10/30/20 231 lb (104.8 kg)     GEN: Well nourished, well developed in no acute distress HEENT: Normal NECK: No JVD; No carotid bruits LYMPHATICS: No lymphadenopathy CARDIAC: RRR, no murmurs, rubs, gallops.  CRT-D pocket well-healed. RESPIRATORY:  Clear to auscultation without rales, wheezing or rhonchi  ABDOMEN: Soft, non-tender, non-distended MUSCULOSKELETAL:  No edema; No deformity  SKIN: Warm and dry NEUROLOGIC:  Alert and oriented x 3 PSYCHIATRIC:  Normal affect        ASSESSMENT:    1. Chronic systolic heart failure ([place])   2. Primary hypertension   3. Cardiac resynchronization therapy defibrillator (CRT-D) in place   4. Permanent atrial fibrillation (HCC)    PLAN:    In order of problems listed above:  #Chronic systolic heart failure NYHA class II today.  Warm and dry on exam.  Continue current medication regimen.  #Hypertension Controlled Continue current regimen  #CRT-D in place No recent shocks.  Continue current medication regimen including mexiletine given his history of VT.  #Permanent atrial fibrillation Not a candidate for long-term anticoagulation because of history of active malignancy and varices.  This been discussed at length with the patient and his daughter during previous visits.  I also discussed during this visit with the patient.  Follow-up 1 year with APP   Medication Adjustments/Labs and Tests Ordered: Current medicines are reviewed at length with the patient today.  Concerns regarding medicines are outlined above.  No orders of the defined types were placed in this encounter.  No orders of the defined types were placed in this encounter.    Signed, Lars Mage, MD, [facility], [facility] 01/22/2021 9:22 AM    Electrophysiology [facility]  HeartCare

## 2021-01-22 ENCOUNTER — Encounter: Payer: Self-pay | Admitting: Cardiology

## 2021-01-22 ENCOUNTER — Other Ambulatory Visit: Payer: Self-pay

## 2021-01-22 ENCOUNTER — Ambulatory Visit (INDEPENDENT_AMBULATORY_CARE_PROVIDER_SITE_OTHER): Payer: PPO | Admitting: Cardiology

## 2021-01-22 VITALS — BP 130/70 | HR 70 | Ht 70.0 in | Wt 236.6 lb

## 2021-01-22 DIAGNOSIS — I4821 Permanent atrial fibrillation: Secondary | ICD-10-CM

## 2021-01-22 DIAGNOSIS — I5022 Chronic systolic (congestive) heart failure: Secondary | ICD-10-CM

## 2021-01-22 DIAGNOSIS — Z95810 Presence of automatic (implantable) cardiac defibrillator: Secondary | ICD-10-CM | POA: Diagnosis not present

## 2021-01-22 DIAGNOSIS — I10 Essential (primary) hypertension: Secondary | ICD-10-CM | POA: Diagnosis not present

## 2021-01-22 NOTE — Patient Instructions (Addendum)
Medication Instructions:  Your physician recommends that you continue on your current medications as directed. Please refer to the Current Medication list given to you today. *If you need a refill on your cardiac medications before your next appointment, please call your pharmacy*  Lab Work: None ordered. If you have labs (blood work) drawn today and your tests are completely normal, you will receive your results only by: [place] (if you have MyChart) OR A paper copy in the mail If you have any lab test that is abnormal or we need to change your treatment, we will call you to review the results.  Testing/Procedures: None ordered.  Follow-Up: At [facility], you and your health needs are our priority.  As part of our continuing mission to provide you with exceptional heart care, we have created designated Provider Care Teams.  These Care Teams include your primary Cardiologist (physician) and Advanced Practice Providers (APPs -  Physician Assistants and Nurse Practitioners) who all work together to provide you with the care you need, when you need it.  Your next appointment:   Your physician wants you to follow-up in: one year with one of the following Advanced Practice Providers on your designated Care Team:   Murray Hodgkins, NP Christell Faith, PA-C Cadence Kathlen Mody, PA-C You will receive a reminder letter in the mail two months in advance. If you don't receive a letter, please call our office to schedule the follow-up appointment.  Remote monitoring is used to monitor your ICD from home. This monitoring reduces the number of office visits required to check your device to one time per year. It allows [place] to keep an eye on the functioning of your device to ensure it is working properly. You are scheduled for a device check from home on 04/08/2021. You may send your transmission at any time that day. If you have a wireless device, the transmission will be sent automatically. After your  physician reviews your transmission, you will receive a postcard with your next transmission date.

## 2021-01-23 ENCOUNTER — Ambulatory Visit
Admission: RE | Admit: 2021-01-23 | Discharge: 2021-01-23 | Disposition: A | Discharge status: Home / Self Care | Payer: PPO | Source: Ambulatory Visit | Attending: Interventional Radiology | Admitting: Interventional Radiology

## 2021-01-23 ENCOUNTER — Encounter: Payer: Self-pay | Admitting: *Deleted

## 2021-01-23 DIAGNOSIS — C220 Liver cell carcinoma: Secondary | ICD-10-CM | POA: Diagnosis not present

## 2021-01-23 DIAGNOSIS — Z9889 Other specified postprocedural states: Secondary | ICD-10-CM | POA: Diagnosis not present

## 2021-01-23 HISTORY — PX: IR RADIOLOGIST EVAL & MGMT: IMG5224

## 2021-01-23 NOTE — Progress Notes (Signed)
Reason for follow up: 3 months status post TARE  History of present illness: 70 year old male with history of NASH cirrhosis Marcello Moores A) and enlarging multifocal, unilobar right hepatocellular carcinoma status post right lobe radioembolization (34.2 mCi) on 10/30/20.  Initial post-treatement MRI was obtained on 01/06/21, results below.  He continues to feel well.  He has no complaints related to the procedure.  He continues to have normal energy levels, good appetite, no nausea/vomiting, fevers/chills, or change in bowel habits.  Past Medical History:  Diagnosis Date   Acute respiratory failure with hypoxia ([place]) 06/2016   CAD (coronary artery disease)    a. Remote PCI of the RCA;  b. 03/2013 Inf STEMI/PCI: LAD 60-70d, RCA 99p ISR(rota/CBA/DES);  c. 09/2014 MV: inf and infsept infarct, EF 35-40%;  d. 09/2014 Cath: patent RCA stent, stable LAD dzs, EF 40%-->Med rx.   CHF (congestive heart failure) ([place])    Diabetes mellitus    sees Dr. Hardin Negus in [place]   GERD (gastroesophageal reflux disease)    HFrEF (heart failure with reduced ejection fraction) ([place])    Hyperlipidemia    Hypertension    Ischemic cardiomyopathy    a. 09/2014 EF 40% by V gram;  b. 06/2016 Echo: EF 40, diff HK, Gr1 DD, inf HK, no mural thrombus. Mild MR, mildly dil LA, mildly dil RV.    LBBB (left bundle branch block)    Old   Liver mass    Morbid obesity ([place])    Peripheral neuropathy    Upper GI bleed    Vertigo     Past Surgical History:  Procedure Laterality Date   BIV ICD INSERTION CRT-D N/A 04/08/2020   Procedure: BIV ICD INSERTION CRT-D;  Surgeon: Vickie Epley, MD;  Location: [place] [facility];  Service: Cardiovascular;  Laterality: N/A;   CARDIAC CATHETERIZATION N/A 10/03/2014   Procedure: Left Heart Cath and Coronary Angiography;  Surgeon: [name], MD;  Location: [place] [facility];  Service: Cardiovascular;  Laterality: N/A;   CIRCUMCISION N/A 07/22/2015   Procedure: CIRCUMCISION  ADULT;  Surgeon: Hollice Espy, MD;  Location: [facility];  Service: Urology;  Laterality: N/A;   COLONOSCOPY WITH PROPOFOL N/A 01/12/2019   Procedure: COLONOSCOPY WITH PROPOFOL;  Surgeon: Lucilla Lame, MD;  Location: [facility] ENDOSCOPY;  Service: Endoscopy;  Laterality: N/A;   CORONARY STENT PLACEMENT     ESOPHAGOGASTRODUODENOSCOPY (EGD) WITH PROPOFOL N/A 09/12/2014   Procedure: ESOPHAGOGASTRODUODENOSCOPY (EGD) WITH PROPOFOL;  Surgeon: Ronald Lobo, MD;  Location: [facility] ENDOSCOPY;  Service: Endoscopy;  Laterality: N/A;   EXTERNAL FIXATION LEG Left 04/03/2012   Procedure: EXTERNAL FIXATION LEG;  Surgeon: Wylene Simmer, MD;  Location: [place];  Service: Orthopedics;  Laterality: Left;   EXTERNAL FIXATION REMOVAL Left 04/14/2012   Procedure: REMOVAL EXTERNAL FIXATION LEG;  Surgeon: Wylene Simmer, MD;  Location: [place];  Service: Orthopedics;  Laterality: Left;   IR 3D INDEPENDENT WKST  10/16/2020   IR 3D INDEPENDENT WKST  10/30/2020   IR ANGIOGRAM SELECTIVE EACH ADDITIONAL VESSEL  10/16/2020   IR ANGIOGRAM SELECTIVE EACH ADDITIONAL VESSEL  10/30/2020   IR ANGIOGRAM VISCERAL SELECTIVE  10/16/2020   IR ANGIOGRAM VISCERAL SELECTIVE  10/30/2020   IR EMBO ARTERIAL NOT HEMORR HEMANG INC GUIDE ROADMAPPING  10/16/2020   IR EMBO TUMOR ORGAN ISCHEMIA INFARCT INC GUIDE ROADMAPPING  10/30/2020   IR RADIOLOGIST EVAL & MGMT  09/06/2020   IR RADIOLOGIST EVAL & MGMT  11/27/2020   IR US GUIDE VASC ACCESS RIGHT  10/16/2020   IR US GUIDE VASC ACCESS RIGHT  10/30/2020   IRRIGATION AND DEBRIDEMENT KNEE Left 04/03/2012   Procedure: IRRIGATION AND DEBRIDEMENT KNEE;  Surgeon: Wylene Simmer, MD;  Location: [place];  Service: Orthopedics;  Laterality: Left;   LEFT HEART CATHETERIZATION WITH CORONARY ANGIOGRAM N/A 04/05/2013   Procedure: LEFT HEART CATHETERIZATION WITH CORONARY ANGIOGRAM;  Surgeon: [name], MD;  Location: [place] [facility];  Service: Cardiovascular;  Laterality: N/A;   MASS EXCISION N/A 07/22/2015   Procedure: EXCISION PENILE MASS;   Surgeon: Hollice Espy, MD;  Location: [facility]S;  Service: Urology;  Laterality: N/A;   ORIF TIBIA PLATEAU Left 04/14/2012   Procedure: OPEN REDUCTION INTERNAL FIXATION (ORIF) TIBIAL PLATEAU;  Surgeon: Wylene Simmer, MD;  Location: [place];  Service: Orthopedics;  Laterality: Left;   PERCUTANEOUS CORONARY ROTOBLATOR INTERVENTION (PCI-R) N/A 04/06/2013   Procedure: PERCUTANEOUS CORONARY ROTOBLATOR INTERVENTION (PCI-R);  Surgeon: [name], MD;  Location: [facility] CATH LAB;  Service: Cardiovascular;  Laterality: N/A;   RIGHT/LEFT HEART CATH AND CORONARY ANGIOGRAPHY N/A 08/21/2019   Procedure: RIGHT/LEFT HEART CATH AND CORONARY ANGIOGRAPHY;  Surgeon: [name], MD;  Location: [place] [facility];  Service: Cardiovascular;  Laterality: N/A;    Allergies: Patient has no known allergies.  Medications: Prior to Admission medications   Medication Sig Start Date End Date Taking? Authorizing Provider  atorvastatin (LIPITOR) 40 MG tablet TAKE ONE TABLET BY MOUTH EVERY DAY 11/15/20   Theora Gianotti, NP  carvedilol (COREG) 12.5 MG tablet Take 1 tablet (12.5 mg total) by mouth 2 (two) times daily. 10/23/20   Vickie Epley, MD  CINNAMON PO Take 1,000 mg by mouth in the morning and at bedtime.    [provider]  clopidogrel (PLAVIX) 75 MG tablet TAKE ONE TABLET EVERY MORNING WITH BREAKFAST 10/17/20   [name], MD  dapagliflozin propanediol ([name]) 10 MG TABS tablet Take 1 tablet (10 mg total) by mouth daily before breakfast. 11/19/20   [name], MD  Dulaglutide ([name]) 3 ZO/1.0RU SOPN Inject 3 mg as directed once a week. 01/25/20   [name], MD  ferrous sulfate 325 (65 FE) MG tablet Take 162.5 mg by mouth daily with breakfast.    [provider]  furosemide (LASIX) 40 MG tablet Take 1 tablet (40 mg total) by mouth daily. 10/24/20   [name], MD  gabapentin (NEURONTIN) 300 MG capsule Take 1 capsule (300 mg total) by mouth every  morning AND 1 capsule (300 mg total) daily with lunch AND 2 capsules (600 mg total) at bedtime. 10/04/20   [name], MD  mexiletine (MEXITIL) 150 MG capsule Take 1 capsule (150 mg total) by mouth 2 (two) times daily. 10/23/20   Vickie Epley, MD  Multiple Vitamin (MULTIVITAMIN WITH MINERALS) TABS Take 1 tablet by mouth daily. Multivitamin for Men 50+    [provider]  nitroGLYCERIN (NITROSTAT) 0.4 MG SL tablet Place 1 tablet (0.4 mg total) under the tongue every 5 (five) minutes as needed for chest pain. 05/16/20   [name], MD  pantoprazole (PROTONIX) 40 MG tablet TAKE 1 TABLET BY MOUTH TWICE DAILY. 10/17/20   [name], MD  sacubitril-valsartan (ENTRESTO) 24-26 MG Take 1 tablet by mouth 2 (two) times daily. Patient taking differently: Take 0.5 tablets by mouth 2 (two) times daily. 12/06/20   [name], MD     Family History  Problem Relation Age of Onset   Coronary artery  disease Father    Coronary artery disease Brother    Lung cancer Brother    Breast cancer Sister    Prostate cancer Brother    Stroke Sister     Social History   Socioeconomic History   Marital status: Divorced    Spouse name: Not on file   Number of children: Not on file   Years of education: Not on file   Highest education level: Not on file  Occupational History   Occupation: heavy Company secretary    Employer: [employer]  Tobacco Use   Smoking status: Former    Packs/day: 3.00    Years: 40.00    Pack years: 120.00    Types: Cigarettes    Quit date: 04/05/1995    Years since quitting: 25.8   Smokeless tobacco: Former   Tobacco comments:    started smoking at age 17  Vaping Use   Vaping Use: Never used  Substance and Sexual Activity   Alcohol use: No    Comment: Occassional Use   Drug use: No   Sexual activity: Not on file  Other Topics Concern   Not on file  Social History Narrative   Heavy Company secretary.; Divorced; quit smoking 25  years; beer rare; by self. Still riding motor cycles.    Social Determinants of Health   Financial Resource Strain: Medium Risk   Difficulty of Paying Living Expenses: Somewhat hard  Food Insecurity: No Food Insecurity   Worried About Charity fundraiser in the Last Year: Never true   Ran Out of Food in the Last Year: Never true  Transportation Needs: No Transportation Needs   Lack of Transportation (Medical): No   Lack of Transportation (Non-Medical): No  Physical Activity: Not on file  Stress: No Stress Concern Present   Feeling of Stress : Not at all  Social Connections: Unknown   Frequency of Communication with Friends and Family: More than three times a week   Frequency of Social Gatherings with Friends and Family: Not on file   Attends Religious Services: Not on file   Active Member of Clubs or Organizations: Not on file   Attends Archivist Meetings: Not on file   Marital Status: Not on file     Vital Signs: BP 136/70 (BP Location: Left Arm)    Pulse 70    SpO2 98%   Physical Exam Constitutional:      General: He is not in acute distress. HENT:     Head: Normocephalic.     Mouth/Throat:     Mouth: Mucous membranes are moist.  Eyes:     General: No scleral icterus. Cardiovascular:     Rate and Rhythm: Normal rate and regular rhythm.     Heart sounds: Normal heart sounds.  Pulmonary:     Breath sounds: Normal breath sounds.  Abdominal:     General: There is no distension.     Tenderness: There is no abdominal tenderness.  Skin:    General: Skin is warm and dry.     Coloration: Skin is not jaundiced.  Neurological:     Mental Status: He is alert and oriented to person, place, and time.    Imaging: Segment VIII index lesion:  1.6 x 1.3 cm (10/15/20)   (01/06/21)  Seg V Lesion  (10/15/20)   (01/06/21)  Seg VI Lesion  Absent on Post Y90 MRI   Post-treatment effect from ~50% of dose delivered to segment  VIII  Labs:  CBC: Recent Labs    08/21/20 1028 10/16/20 0723 10/30/20 0740 12/16/20 0822  WBC 6.8 5.8 8.3 7.5  HGB 14.8 15.0 15.2 15.0  HCT 43.1 44.6 45.9 45.1  PLT 162 154 172 140    COAGS: Recent Labs    10/16/20 0723 10/30/20 0740 12/16/20 0822  INR 1.1 1.1 CANCELED  APTT 31  --   --     BMP: Recent Labs    08/01/20 1129 08/21/20 1028 10/04/20 0901 10/16/20 0723 10/23/20 1421 10/30/20 0740 12/16/20 0822  NA 137 136   < > 137 140 140 142  K 4.3 4.2   < > 4.0 4.2 3.9 4.1  CL 103 101   < > 104 103 102 106  CO2 25 25   < > 24 22 24 26   GLUCOSE 131* 131*   < > 127* 124* 131* 124*  BUN 15 25*   < > 16 16 21 17   CALCIUM 8.9 8.8*   < > 8.8* 8.9 8.8* 8.8  CREATININE 1.22 1.70*   < > 1.29* 1.22 1.46* 1.43*  GFRNONAA >60 42*  --  59*  --  51*  --    < > = values in this interval not displayed.    LIVER FUNCTION TESTS: Recent Labs    07/05/20 1503 07/29/20 0000 08/21/20 1028 10/16/20 0723 10/30/20 0740 12/16/20 0822  BILITOT 0.6  --  1.0 1.0 1.6* 1.3*  AST 40  --  33 48* 45* 47*  ALT 36  --  25 33 36 34  ALKPHOS 165* 153* 116 125 137*  --   PROT 6.6  --  7.1 6.2* 6.9 6.2  ALBUMIN 3.8  --  3.6 3.4* 3.6  --     A-FP: 01/22/20 5.8 08/21/20 6.9 10/16/20 18.5 12/16/20 13.8  Assessment and Plan: 71 year old male with history of NASH cirrhosis Ardine Eng Pugh A) and enlarging multifocal, unilobar right hepatocellular carcinoma status post right lobe radioembolization (34.2 mCi) on 10/30/20.  He continues to do very well since the procedure.  A-FP remains elevated, though decreased from prior to treatment.  Initial post-treatment MRI demonstrates post-treatment changes to segment VIII with indeterminate response of segment VIII index mass, likely due to scan being obtained only 2 months post-treatment.  Segment V mass has significantly decreased in size, and segment VI mass has resolved entirely.  Overall, his early response has been favorable.  Plan for  follow up in 3 months with repeat MRI abdomen and labs (including CBC, CMP, INR, and a-FP).  Electronically Signed: Suzette Battiest 01/23/2021, 10:51 AM   I spent a total of 40 Minutes in face to face in clinical consultation, greater than 50% of which was counseling/coordinating care for hepatocellular carcinoma.

## 2021-01-29 ENCOUNTER — Telehealth: Payer: Self-pay | Admitting: Pharmacy Technician

## 2021-01-29 DIAGNOSIS — Z596 Low income: Secondary | ICD-10-CM

## 2021-01-29 NOTE — Progress Notes (Signed)
[facility])                                            [place] Team    01/29/2021  [place] 10/20/1950 [id number]  Care coordination call placed to [employer] in regard to [facility] application, [name] in regard to [place] application and [name] in regard to [place] application.  Spoke to [place] at [name] and she informs the application is still in process for a 2023 determination.   Spoke to [place] at [place] who informs that patient is APPROVED 8/0/16-55/37/48 for [name]. She informs medication will auto fill and ship in January based on ast fill date in 2022 and the medication will be delivered to patient's home.   Spoke to [place] at [place] and she informs that patient is APPROVED 02/09/21-02/08/22 for [place]. She informs medication should auto ship in 2023 to his home.  Joshiah Traynham P. Duff Pozzi, [place]  [phone]

## 2021-02-06 ENCOUNTER — Other Ambulatory Visit: Payer: Self-pay

## 2021-02-06 ENCOUNTER — Telehealth: Payer: Self-pay | Admitting: Family Medicine

## 2021-02-06 DIAGNOSIS — E1142 Type 2 diabetes mellitus with diabetic polyneuropathy: Secondary | ICD-10-CM

## 2021-02-06 MED ORDER — TRULICITY 3 MG/0.5ML ~~LOC~~ SOAJ: 3.0000 mg | SUBCUTANEOUS | 0 refills | Status: DC

## 2021-02-06 NOTE — Telephone Encounter (Signed)
Pt is needing refill for Dulaglutide ([name]) 3 UI/4.7VV SOPN Pt would like this sent through [facility]. Pts last dosage will be next Wednesday 1/4

## 2021-02-06 NOTE — Telephone Encounter (Signed)
Refill was sent to [facility] per a comment in the chart.  Selmer Adduci,cma

## 2021-02-07 ENCOUNTER — Telehealth: Payer: Self-pay | Admitting: Family Medicine

## 2021-02-07 DIAGNOSIS — E1142 Type 2 diabetes mellitus with diabetic polyneuropathy: Secondary | ICD-10-CM

## 2021-02-07 MED ORDER — TRULICITY 3 MG/0.5ML ~~LOC~~ SOAJ: 3.0000 mg | SUBCUTANEOUS | 1 refills | Status: AC

## 2021-02-07 MED ORDER — DAPAGLIFLOZIN PROPANEDIOL 10 MG PO TABS: 10.0000 mg | ORAL_TABLET | Freq: Every day | ORAL | 3 refills | Status: AC

## 2021-02-07 NOTE — Telephone Encounter (Signed)
Pt daughter Myra Gianotti called in requesting sample medication Wilder Glade). Pt daughter stated that Pt hasn't received medication yet. Pt daughter is requesting callback at [phone]

## 2021-02-07 NOTE — Telephone Encounter (Signed)
Called [facility]. Their [place] order shipped today and patient is out of medication.    Medication Samples have been provided to the patient.  Drug name: Wilder Glade       Strength: 10 mg        Qty: 2 boxes  LOT: [id number]  Exp.Date: 09/09/2023  Dosing instructions: Take 1 tablet by mouth daily  The patient has been instructed regarding the correct time, dose, and frequency of taking this medication, including desired effects and most common side effects.   [name] 1:20 PM 02/07/2021

## 2021-02-08 ENCOUNTER — Encounter (HOSPITAL_COMMUNITY): Payer: Self-pay | Admitting: Emergency Medicine

## 2021-02-08 ENCOUNTER — Emergency Department (HOSPITAL_COMMUNITY): Payer: PPO

## 2021-02-08 ENCOUNTER — Other Ambulatory Visit: Payer: Self-pay

## 2021-02-08 ENCOUNTER — Emergency Department (HOSPITAL_COMMUNITY)
Admission: EM | Admit: 2021-02-08 | Discharge: 2021-02-09 | Disposition: A | Discharge status: Home / Self Care | Payer: PPO | Attending: Emergency Medicine | Admitting: Emergency Medicine

## 2021-02-08 DIAGNOSIS — Z85828 Personal history of other malignant neoplasm of skin: Secondary | ICD-10-CM | POA: Insufficient documentation

## 2021-02-08 DIAGNOSIS — Z95810 Presence of automatic (implantable) cardiac defibrillator: Secondary | ICD-10-CM | POA: Diagnosis not present

## 2021-02-08 DIAGNOSIS — I509 Heart failure, unspecified: Secondary | ICD-10-CM | POA: Diagnosis not present

## 2021-02-08 DIAGNOSIS — I5023 Acute on chronic systolic (congestive) heart failure: Secondary | ICD-10-CM | POA: Diagnosis not present

## 2021-02-08 DIAGNOSIS — Z8549 Personal history of malignant neoplasm of other male genital organs: Secondary | ICD-10-CM | POA: Diagnosis not present

## 2021-02-08 DIAGNOSIS — J811 Chronic pulmonary edema: Secondary | ICD-10-CM | POA: Diagnosis not present

## 2021-02-08 DIAGNOSIS — Z7901 Long term (current) use of anticoagulants: Secondary | ICD-10-CM | POA: Diagnosis not present

## 2021-02-08 DIAGNOSIS — I2510 Atherosclerotic heart disease of native coronary artery without angina pectoris: Secondary | ICD-10-CM | POA: Diagnosis not present

## 2021-02-08 DIAGNOSIS — N183 Chronic kidney disease, stage 3 unspecified: Secondary | ICD-10-CM | POA: Diagnosis not present

## 2021-02-08 DIAGNOSIS — E119 Type 2 diabetes mellitus without complications: Secondary | ICD-10-CM | POA: Diagnosis not present

## 2021-02-08 DIAGNOSIS — R059 Cough, unspecified: Secondary | ICD-10-CM | POA: Diagnosis not present

## 2021-02-08 DIAGNOSIS — Z87891 Personal history of nicotine dependence: Secondary | ICD-10-CM | POA: Insufficient documentation

## 2021-02-08 DIAGNOSIS — Z20822 Contact with and (suspected) exposure to covid-19: Secondary | ICD-10-CM | POA: Diagnosis not present

## 2021-02-08 DIAGNOSIS — I517 Cardiomegaly: Secondary | ICD-10-CM | POA: Diagnosis not present

## 2021-02-08 DIAGNOSIS — R0602 Shortness of breath: Secondary | ICD-10-CM | POA: Diagnosis not present

## 2021-02-08 DIAGNOSIS — I130 Hypertensive heart and chronic kidney disease with heart failure and stage 1 through stage 4 chronic kidney disease, or unspecified chronic kidney disease: Secondary | ICD-10-CM | POA: Insufficient documentation

## 2021-02-08 DIAGNOSIS — I110 Hypertensive heart disease with heart failure: Secondary | ICD-10-CM | POA: Diagnosis not present

## 2021-02-08 LAB — BASIC METABOLIC PANEL
Anion gap: 9 (ref 5–15)
BUN: 13 mg/dL (ref 8–23)
CO2: 24 mmol/L (ref 22–32)
Calcium: 8.6 mg/dL — ABNORMAL LOW (ref 8.9–10.3)
Chloride: 106 mmol/L (ref 98–111)
Creatinine, Ser: 1.53 mg/dL — ABNORMAL HIGH (ref 0.61–1.24)
GFR, Estimated: 48 mL/min — ABNORMAL LOW (ref >60)
Glucose, Bld: 194 mg/dL — ABNORMAL HIGH (ref 70–99)
Potassium: 3.6 mmol/L (ref 3.5–5.1)
Sodium: 139 mmol/L (ref 135–145)

## 2021-02-08 LAB — RESP PANEL BY RT-PCR (FLU A&B, COVID) ARPGX2
Influenza A by PCR: NEGATIVE
Influenza B by PCR: NEGATIVE
SARS Coronavirus 2 by RT PCR: NEGATIVE

## 2021-02-08 LAB — CBC WITH DIFFERENTIAL/PLATELET
Abs Immature Granulocytes: 0.04 10*3/uL (ref 0.00–0.07)
Basophils Absolute: 0 10*3/uL (ref 0.0–0.1)
Basophils Relative: 0 %
Eosinophils Absolute: 0.1 10*3/uL (ref 0.0–0.5)
Eosinophils Relative: 1 %
HCT: 47 % (ref 39.0–52.0)
Hemoglobin: 15.6 g/dL (ref 13.0–17.0)
Immature Granulocytes: 1 %
Lymphocytes Relative: 4 %
Lymphs Abs: 0.3 10*3/uL — ABNORMAL LOW (ref 0.7–4.0)
MCH: 33.1 pg (ref 26.0–34.0)
MCHC: 33.2 g/dL (ref 30.0–36.0)
MCV: 99.6 fL (ref 80.0–100.0)
Monocytes Absolute: 0.6 10*3/uL (ref 0.1–1.0)
Monocytes Relative: 8 %
Neutro Abs: 6.6 10*3/uL (ref 1.7–7.7)
Neutrophils Relative %: 86 %
Platelets: 141 10*3/uL — ABNORMAL LOW (ref 150–400)
RBC: 4.72 MIL/uL (ref 4.22–5.81)
RDW: 13.9 % (ref 11.5–15.5)
WBC: 7.7 10*3/uL (ref 4.0–10.5)
nRBC: 0 % (ref 0.0–0.2)

## 2021-02-08 LAB — BRAIN NATRIURETIC PEPTIDE: B Natriuretic Peptide: 599.5 pg/mL — ABNORMAL HIGH (ref 0.0–100.0)

## 2021-02-08 MED ORDER — FUROSEMIDE 10 MG/ML IJ SOLN
40.0000 mg | Freq: Once | INTRAMUSCULAR | Status: AC
  Administered 2021-02-08: 40 mg via INTRAVENOUS
  Filled 2021-02-08: qty 4

## 2021-02-08 NOTE — ED Provider Notes (Signed)
[facility] EMERGENCY DEPARTMENT Provider Note   CSN: [id number] Arrival date & time: 02/08/21  1926     History Chief Complaint  Patient presents with   Shortness of Breath    Devin Hanna is a 70 y.o. male.  HPI Patient is a 70 year old male with a history of CHF, hyperlipidemia, hypertension, CKD, who presents to the emergency department due to shortness of breath.  Patient states that he has been having a mild productive cough for the past 2 days.  While sitting in his recliner he felt more short of breath than normal about 3 hours prior to arrival.  Due to this he contacted the fire department.  They found his oxygen saturations to be at 92% and he was started on O2 via nasal cannula.  This improved to the high 90s.  Patient states since coming to the emergency department his shortness of breath has resolved.  Reports chronic pedal edema and states that he has been compliant with his Lasix.  Denies any chest pain.  Denies any other URI symptoms.  No vomiting or diarrhea.  Patient states he takes 40 mg of Lasix daily and then another half dose in the evening as needed.  Echocardiogram on August 15, 2020 after pacemaker implementation shows an LVEF of 50% to 55%.  Right ventricular systolic function is normal.    Past Medical History:  Diagnosis Date   Acute respiratory failure with hypoxia ([place]) 06/2016   CAD (coronary artery disease)    a. Remote PCI of the RCA;  b. 03/2013 Inf STEMI/PCI: LAD 60-70d, RCA 99p ISR(rota/CBA/DES);  c. 09/2014 MV: inf and infsept infarct, EF 35-40%;  d. 09/2014 Cath: patent RCA stent, stable LAD dzs, EF 40%-->Med rx.   CHF (congestive heart failure) ([place])    Diabetes mellitus    sees Dr. Hardin Negus in [place]   GERD (gastroesophageal reflux disease)    HFrEF (heart failure with reduced ejection fraction) ([place])    Hyperlipidemia    Hypertension    Ischemic cardiomyopathy    a. 09/2014 EF 40% by V gram;  b. 06/2016 Echo: EF 40, diff  HK, Gr1 DD, inf HK, no mural thrombus. Mild MR, mildly dil LA, mildly dil RV.    LBBB (left bundle branch block)    Old   Liver mass    Morbid obesity (HCC)    Peripheral neuropathy    Upper GI bleed    Vertigo     Patient Active Problem List   Diagnosis Date Noted   Persistent atrial fibrillation ([place]) 09/05/2020   Secondary hypercoagulable state ([place]) 09/05/2020   Cough 01/11/2020   Left knee pain 01/11/2020   Abnormal cardiovascular stress test    Acute on chronic systolic heart failure ([place]) 07/30/2019   AK (actinic keratosis) 04/03/2019   Colon cancer screening    Rectal polyp    Polyp of transverse colon    Liver lesion 12/06/2018   Chronic kidney disease (CKD), stage III (moderate) ([place]) 12/06/2018   Abrasion 12/06/2018   History of anemia 03/30/2018   Hyperpigmented skin lesion 12/20/2017   Bilateral foot pain 12/20/2017   DDD (degenerative disc disease), lumbar 08/18/2017   SCC (squamous cell carcinoma), trunk 05/17/2017   Venous insufficiency 02/12/2017   Decreased pedal pulses 02/12/2017   Skin lesion 02/12/2017   Neuropathy 01/09/2016   Elevated LFTs 06/28/2015   History of penile cancer 06/28/2015   Peptic ulcer disease 09/12/2014   Erectile dysfunction 17/51/0258   Chronic systolic heart  failure ([place]) 05/15/2013   Hyperlipidemia 05/15/2013   DM type 2 (diabetes mellitus, type 2) ([place]) 04/07/2013   Other and unspecified hyperlipidemia 04/07/2013   Morbid obesity ([place]) 04/07/2013   HTN (hypertension) 04/07/2013   CAD (coronary artery disease) 04/04/2013    Past Surgical History:  Procedure Laterality Date   BIV ICD INSERTION CRT-D N/A 04/08/2020   Procedure: BIV ICD INSERTION CRT-D;  Surgeon: Vickie Epley, MD;  Location: [facility];  Service: Cardiovascular;  Laterality: N/A;   CARDIAC CATHETERIZATION N/A 10/03/2014   Procedure: Left Heart Cath and Coronary Angiography;  Surgeon: [name], MD;  Location: [facility];  Service:  Cardiovascular;  Laterality: N/A;   CIRCUMCISION N/A 07/22/2015   Procedure: CIRCUMCISION ADULT;  Surgeon: Hollice Espy, MD;  Location: [facility] ORS;  Service: Urology;  Laterality: N/A;   COLONOSCOPY WITH PROPOFOL N/A 01/12/2019   Procedure: COLONOSCOPY WITH PROPOFOL;  Surgeon: Lucilla Lame, MD;  Location: [facility] ENDOSCOPY;  Service: Endoscopy;  Laterality: N/A;   CORONARY STENT PLACEMENT     ESOPHAGOGASTRODUODENOSCOPY (EGD) WITH PROPOFOL N/A 09/12/2014   Procedure: ESOPHAGOGASTRODUODENOSCOPY (EGD) WITH PROPOFOL;  Surgeon: Ronald Lobo, MD;  Location: [facility] ENDOSCOPY;  Service: Endoscopy;  Laterality: N/A;   EXTERNAL FIXATION LEG Left 04/03/2012   Procedure: EXTERNAL FIXATION LEG;  Surgeon: Wylene Simmer, MD;  Location: [facility];  Service: Orthopedics;  Laterality: Left;   EXTERNAL FIXATION REMOVAL Left 04/14/2012   Procedure: REMOVAL EXTERNAL FIXATION LEG;  Surgeon: Wylene Simmer, MD;  Location: [facility];  Service: Orthopedics;  Laterality: Left;   IR 3D INDEPENDENT WKST  10/16/2020   IR 3D INDEPENDENT WKST  10/30/2020   IR ANGIOGRAM SELECTIVE EACH ADDITIONAL VESSEL  10/16/2020   IR ANGIOGRAM SELECTIVE EACH ADDITIONAL VESSEL  10/30/2020   IR ANGIOGRAM VISCERAL SELECTIVE  10/16/2020   IR ANGIOGRAM VISCERAL SELECTIVE  10/30/2020   IR EMBO ARTERIAL NOT HEMORR HEMANG INC GUIDE ROADMAPPING  10/16/2020   IR EMBO TUMOR ORGAN ISCHEMIA INFARCT INC GUIDE ROADMAPPING  10/30/2020   IR RADIOLOGIST EVAL & MGMT  09/06/2020   IR RADIOLOGIST EVAL & MGMT  11/27/2020   IR RADIOLOGIST EVAL & MGMT  01/23/2021   IR US GUIDE VASC ACCESS RIGHT  10/16/2020   IR US GUIDE VASC ACCESS RIGHT  10/30/2020   IRRIGATION AND DEBRIDEMENT KNEE Left 04/03/2012   Procedure: IRRIGATION AND DEBRIDEMENT KNEE;  Surgeon: Wylene Simmer, MD;  Location: [facility];  Service: Orthopedics;  Laterality: Left;   LEFT HEART CATHETERIZATION WITH CORONARY ANGIOGRAM N/A 04/05/2013   Procedure: LEFT HEART CATHETERIZATION WITH CORONARY ANGIOGRAM;  Surgeon: [name], MD;   Location: [facility] C[facility] LAB;  Service: Cardiovascular;  Laterality: N/A;   MASS EXCISION N/A 07/22/2015   Procedure: EXCISION PENILE MASS;  Surgeon: Hollice Espy, MD;  Location: [facility] ORS;  Service: Urology;  Laterality: N/A;   ORIF TIBIA PLATEAU Left 04/14/2012   Procedure: OPEN REDUCTION INTERNAL FIXATION (ORIF) TIBIAL PLATEAU;  Surgeon: Wylene Simmer, MD;  Location: [facility];  Service: Orthopedics;  Laterality: Left;   PERCUTANEOUS CORONARY ROTOBLATOR INTERVENTION (PCI-R) N/A 04/06/2013   Procedure: PERCUTANEOUS CORONARY ROTOBLATOR INTERVENTION (PCI-R);  Surgeon: [name], MD;  Location: [facility] CATH LAB;  Service: Cardiovascular;  Laterality: N/A;   RIGHT/LEFT HEART CATH AND CORONARY ANGIOGRAPHY N/A 08/21/2019   Procedure: RIGHT/LEFT HEART CATH AND CORONARY ANGIOGRAPHY;  Surgeon: [name], MD;  Location: [facility] LAB;  Service: Cardiovascular;  Laterality: N/A;       Family History  Problem Relation  Age of Onset   Coronary artery disease Father    Coronary artery disease Brother    Lung cancer Brother    Breast cancer Sister    Prostate cancer Brother    Stroke Sister     Social History   Tobacco Use   Smoking status: Former    Packs/day: 3.00    Years: 40.00    Pack years: 120.00    Types: Cigarettes    Quit date: 04/05/1995    Years since quitting: 25.8   Smokeless tobacco: Former   Tobacco comments:    started smoking at age 16  Vaping Use   Vaping Use: Never used  Substance Use Topics   Alcohol use: No    Comment: Occassional Use   Drug use: No    Home Medications Prior to Admission medications   Medication Sig Start Date End Date Taking? Authorizing Provider  atorvastatin (LIPITOR) 40 MG tablet TAKE ONE TABLET BY MOUTH EVERY DAY 11/15/20   Theora Gianotti, NP  carvedilol (COREG) 12.5 MG tablet Take 1 tablet (12.5 mg total) by mouth 2 (two) times daily. 10/23/20   Vickie Epley, MD  CINNAMON PO Take 1,000 mg by mouth in the morning and at  bedtime.    [provider]  clopidogrel (PLAVIX) 75 MG tablet TAKE ONE TABLET EVERY MORNING WITH BREAKFAST 10/17/20   [name], MD  dapagliflozin propanediol ([name]) 10 MG TABS tablet Take 1 tablet (10 mg total) by mouth daily before breakfast. 02/07/21   [name], MD  Dulaglutide ([name]) 3 BW/6.2MB SOPN Inject 3 mg as directed once a week. 02/07/21   [name], MD  ferrous sulfate 325 (65 FE) MG tablet Take 162.5 mg by mouth daily with breakfast.    [provider]  furosemide (LASIX) 40 MG tablet Take 1 tablet (40 mg total) by mouth daily. 10/24/20   [name], MD  gabapentin (NEURONTIN) 300 MG capsule Take 1 capsule (300 mg total) by mouth every morning AND 1 capsule (300 mg total) daily with lunch AND 2 capsules (600 mg total) at bedtime. 10/04/20   [name], MD  mexiletine (MEXITIL) 150 MG capsule Take 1 capsule (150 mg total) by mouth 2 (two) times daily. 10/23/20   Vickie Epley, MD  Multiple Vitamin (MULTIVITAMIN WITH MINERALS) TABS Take 1 tablet by mouth daily. Multivitamin for Men 50+    [provider]  nitroGLYCERIN (NITROSTAT) 0.4 MG SL tablet Place 1 tablet (0.4 mg total) under the tongue every 5 (five) minutes as needed for chest pain. 05/16/20   [name], MD  pantoprazole (PROTONIX) 40 MG tablet TAKE 1 TABLET BY MOUTH TWICE DAILY. 10/17/20   [name], MD  sacubitril-valsartan (ENTRESTO) 24-26 MG Take 1 tablet by mouth 2 (two) times daily. Patient taking differently: Take 0.5 tablets by mouth 2 (two) times daily. 12/06/20   [name], MD    Allergies    Patient has no known allergies.  Review of Systems   Review of Systems  All other systems reviewed and are negative. Ten systems reviewed and are negative for acute change, except as noted in the HPI.   Physical Exam Updated Vital Signs BP 130/74    Pulse 69    Temp 98.8 F (37.1 C) (Oral)    Resp (!) 21    SpO2 94%    Physical Exam Vitals and nursing note reviewed.  Constitutional:      General:  He is not in acute distress.    Appearance: Normal appearance. He is not ill-appearing, toxic-appearing or diaphoretic.  HENT:     Head: Normocephalic and atraumatic.     Right Ear: External ear normal.     Left Ear: External ear normal.     Nose: Nose normal.     Mouth/Throat:     Mouth: Mucous membranes are moist.     Pharynx: Oropharynx is clear. No oropharyngeal exudate or posterior oropharyngeal erythema.  Eyes:     Extraocular Movements: Extraocular movements intact.  Cardiovascular:     Rate and Rhythm: Normal rate and regular rhythm.     Pulses: Normal pulses.     Heart sounds: Normal heart sounds. No murmur heard.   No friction rub. No gallop.  Pulmonary:     Effort: Pulmonary effort is normal. No respiratory distress.     Breath sounds: No stridor. Examination of the right-lower field reveals rales. Rales present. No wheezing or rhonchi.     Comments: Oxygen saturations at 98% on room air.  Speaking clearly and in complete sentences.  Mild crackles noted in the right lung base.  No respiratory distress noted. Abdominal:     General: Abdomen is flat.     Tenderness: There is no abdominal tenderness.  Musculoskeletal:        General: Normal range of motion.     Cervical back: Normal range of motion and neck supple. No tenderness.     Right lower leg: Edema present.     Left lower leg: Edema present.     Comments: 1+ pitting edema noted in the bilateral lower extremities, right slightly greater than left.  Skin:    General: Skin is warm and dry.  Neurological:     General: No focal deficit present.     Mental Status: He is alert and oriented to person, place, and time.  Psychiatric:        Mood and Affect: Mood normal.        Behavior: Behavior normal.   ED Results / Procedures / Treatments   Labs (all labs ordered are listed, but only abnormal results are displayed) Labs Reviewed   BASIC METABOLIC PANEL - Abnormal; Notable for the following components:      Result Value   Glucose, Bld 194 (*)    Creatinine, Ser 1.53 (*)    Calcium 8.6 (*)    GFR, Estimated 48 (*)    All other components within normal limits  CBC WITH DIFFERENTIAL/PLATELET - Abnormal; Notable for the following components:   Platelets 141 (*)    Lymphs Abs 0.3 (*)    All other components within normal limits  BRAIN NATRIURETIC PEPTIDE - Abnormal; Notable for the following components:   B Natriuretic Peptide 599.5 (*)    All other components within normal limits  RESP PANEL BY RT-PCR (FLU A&B, COVID) [id number]    EKG EKG Interpretation  Date/Time:  Saturday February 08 2021 19:33:43 EST Ventricular Rate:  71 PR Interval:    QRS Duration: 178 QT Interval:  482 QTC Calculation: 523 R Axis:   263 Text Interpretation: Ventricular-paced rhythm Abnormal ECG  similar to July 2022 Confirmed by Sherwood Gambler [phone]) on 02/08/2021 9:43:05 PM  Radiology DG Chest 2 View  Result Date: 02/08/2021 CLINICAL DATA:  Shortness of breath with cough. EXAM: CHEST - 2 VIEW COMPARISON:  Chest x-ray 07/30/2019. FINDINGS: There is a new ICD present. The heart is mildly enlarged. There is central pulmonary vascular congestion.  There is no lung consolidation, pleural effusion or pneumothorax. There is minimal scarring in the right costophrenic angle. No acute fractures are identified. Degenerative changes affect the thoracic spine. IMPRESSION: 1. New ICD present. 2. Mild cardiomegaly with central pulmonary vascular congestion. Electronically Signed   By: Ronney Asters M.D.   On: 02/08/2021 20:18    Procedures Procedures   Medications Ordered in ED Medications  furosemide (LASIX) injection 40 mg (40 mg Intravenous Given 02/08/21 2228)    ED Course  I have reviewed the triage vital signs and the nursing notes.  Pertinent labs & imaging results that were available during my care of the patient were reviewed by me  and considered in my medical decision making (see chart for details).    MDM Rules/Calculators/A&P                          Pt is a 70 y.o. male who presents to the emergency department due to productive cough and shortness of breath.  Labs: CBC with platelets of 141. BMP with a glucose of 194, creatinine 1.53, calcium of 8.6, GFR 48. BNP of 599.5. Respiratory panel is negative.  Imaging: Chest x-ray shows new ICD present.  Mild cardiomegaly with central pulmonary vascular congestion.  I, Rayna Sexton, PA-C, personally reviewed and evaluated these images and lab results as part of my medical decision-making.  On my exam patient has 1+ pitting edema noted in the bilateral lower extremities.  Slightly right greater than left which patient states is baseline for him.  No tenderness in the region.  Faint crackles noted in the right lung base.  Oxygen saturations appear to be in the high 90s on room air.  Patient denies any continued shortness of breath since coming to the emergency department and he has not been requiring supplemental oxygen since coming to the emergency department.  Chest x-ray with central pulmonary vascular congestion.  BNP elevated above baseline at 599.5.  Prior baseline values appear to be in the 300s.  He states he has been compliant with his Lasix.  Respiratory panel is negative.  Feel that his symptoms are likely secondary to an acute CHF exacerbation.  Patient denies any chest pain.  ECG shows paced rhythm.  History does not appear consistent with ACS.  Patient afebrile and not tachycardic.  No complaints of pleuritic chest pain.  Does not appear consistent with infectious etiology or PE.  We will give a dose of IV Lasix and have him ambulate with a pulse ox after being diuresed.  Patient reassessed and states that he is no longer having shortness of breath.  Ambulated with a pulse ox with oxygen saturations remaining above 96%.  Feel the patient is stable for  discharge at this time and he is agreeable.  His daughter at bedside is going to drive him home.  Recommended that he begin taking his additional as needed Lasix dose for the next 2 to 3 days if he continues to experience any shortness of breath.  Patient understand to return to the emergency department with any new or worsening symptoms.  Recommended cardiology follow-up next week.  His questions were answered and he was amicable at the time of discharge.  Note: Portions of this report may have been transcribed using voice recognition software. Every effort was made to ensure accuracy; however, inadvertent computerized transcription errors may be present.   Final Clinical Impression(s) / ED Diagnoses Final diagnoses:  Acute on chronic congestive  heart failure, unspecified heart failure type [facility])    Rx / DC Orders ED Discharge Orders     None        Rayna Sexton, PA-C 02/09/21 1127    Sherwood Gambler, MD 02/09/21 2115

## 2021-02-08 NOTE — ED Provider Notes (Signed)
Emergency Medicine Provider Triage Evaluation Note  Devin Hanna , a 70 y.o. male  was evaluated in triage.  Pt complains of shortness of breath and a rattle in the chest onset just prior to arrival today.  States he was coughing a [place] bit yesterday, bringing up some yellow mucus.  Denies fevers, chills, sick contacts. Patient is a poor historian, does not use any nebulizers or breathing treatments, denies any known lung history.  Admits to taking a fluid pill and states his right leg is more swollen than the left at baseline  Review of Systems  Positive: Shortness of breath, cough Negative: Chest pain, fever  Physical Exam  BP 108/90    Pulse 68    Temp 98.1 F (36.7 C) (Oral)    Resp 20    SpO2 97%  Gen:   Awake, no distress   Resp:  Normal effort  MSK:   Moves extremities without difficulty  Other:  Pitting edema to bilateral lower extremities, right more so than left.  Coarse lung sounds, mild, throughout  Medical Decision Making  Medically screening exam initiated at 7:33 PM.  Appropriate orders placed.  Devin Hanna was informed that the remainder of the evaluation will be completed by another provider, this initial triage assessment does not replace that evaluation, and the importance of remaining in the ED until their evaluation is complete.     Tacy Learn, PA-C 02/08/21 1934    Dorie Rank, MD 02/09/21 1105

## 2021-02-08 NOTE — ED Triage Notes (Signed)
Pt c/o shortness of breath and "chest rattling" that started today. Reports cough and right sided chest pain yesterday. Denies pain at this time. Bilateral leg swelling that pt states is his normal.

## 2021-02-08 NOTE — Discharge Instructions (Signed)
Please take your additional half dose of Lasix as needed in the evening for the next 2 to 3 days.  Please follow-up with your cardiologist next week.  If you develop any new or worsening symptoms please come back to the emergency department immediately.  It was a pleasure to meet you.

## 2021-02-09 NOTE — ED Notes (Signed)
RN reviewed discharge instructions with pt. Pt verbalized understanding and had no further questions. VSS upon discharge.  

## 2021-02-11 ENCOUNTER — Telehealth: Payer: Self-pay

## 2021-02-11 NOTE — Telephone Encounter (Signed)
Patient called back and would like a nurse to give him a call back, he was told he needs to be seen by Quentin Ore

## 2021-02-11 NOTE — Telephone Encounter (Signed)
[facility] alert for HV therapy Event occurred 1/1 @ 19:21 EGM shows AF with sustained VT, rate 240 falling in to the VF zone, ATP delivered x1 with no change, HV therapy delivered 36J converting to AF/BiV pace. Pt. was seen in the ED 12/31 for SOB and productive cough.  Attempted to call patient, spoke to patients daughter [name], on [place]), advised of ICD shock and requested to have patient return call. States she and other family was with patient over the weekend and he did not mention ICD shock or any new symptoms. States patient has increased his Lasix requested by ED provider.

## 2021-02-11 NOTE — Telephone Encounter (Signed)
Returned patients phone call.   Patient reports he was trying to sleep on the couch and woke up in the floor. Denies any symptoms pre or post shock. States he is having lower back pain from fall. Advised to f/u w/ PCP about lower back pain. Advised of [place] DMV driving restrictions x6 months and shock plan.

## 2021-02-12 ENCOUNTER — Encounter: Payer: Self-pay | Admitting: Cardiology

## 2021-02-12 ENCOUNTER — Other Ambulatory Visit: Payer: Self-pay

## 2021-02-12 ENCOUNTER — Ambulatory Visit (INDEPENDENT_AMBULATORY_CARE_PROVIDER_SITE_OTHER): Payer: PPO | Admitting: Cardiology

## 2021-02-12 VITALS — BP 130/72 | HR 69 | Ht 70.0 in | Wt 226.2 lb

## 2021-02-12 DIAGNOSIS — I4821 Permanent atrial fibrillation: Secondary | ICD-10-CM | POA: Diagnosis not present

## 2021-02-12 DIAGNOSIS — I5022 Chronic systolic (congestive) heart failure: Secondary | ICD-10-CM

## 2021-02-12 DIAGNOSIS — Z95810 Presence of automatic (implantable) cardiac defibrillator: Secondary | ICD-10-CM

## 2021-02-12 LAB — BASIC METABOLIC PANEL
BUN/Creatinine Ratio: 20 (ref 10–24)
BUN: 27 mg/dL (ref 8–27)
CO2: 24 mmol/L (ref 20–29)
Calcium: 9 mg/dL (ref 8.6–10.2)
Chloride: 109 mmol/L — ABNORMAL HIGH (ref 96–106)
Creatinine, Ser: 1.32 mg/dL — ABNORMAL HIGH (ref 0.76–1.27)
Glucose: 139 mg/dL — ABNORMAL HIGH (ref 70–99)
Potassium: 3.6 mmol/L (ref 3.5–5.2)
Sodium: 145 mmol/L — ABNORMAL HIGH (ref 134–144)
eGFR: 57 mL/min/{1.73_m2} — ABNORMAL LOW (ref >59)

## 2021-02-12 LAB — MAGNESIUM: Magnesium: 2.5 mg/dL — ABNORMAL HIGH (ref 1.6–2.3)

## 2021-02-12 MED ORDER — METOPROLOL SUCCINATE ER 50 MG PO TB24: 75.0000 mg | ORAL_TABLET | Freq: Two times a day (BID) | ORAL | 3 refills | Status: DC

## 2021-02-12 MED ORDER — MEXILETINE HCL 150 MG PO CAPS: 150.0000 mg | ORAL_CAPSULE | Freq: Three times a day (TID) | ORAL | 3 refills | Status: DC

## 2021-02-12 NOTE — Patient Instructions (Addendum)
Medication Instructions:  Stop Carvedilol Start Metoprolol Succinate 75 mg two times a day  Increase mexiletine to 150 mg 3 times daily Your physician recommends that you continue on your current medications as directed. Please refer to the Current Medication list given to you today. *If you need a refill on your cardiac medications before your next appointment, please call your pharmacy*  Lab Work: None. If you have labs (blood work) drawn today and your tests are completely normal, you will receive your results only by: [place] (if you have MyChart) OR A paper copy in the mail If you have any lab test that is abnormal or we need to change your treatment, we will call you to review the results.  Testing/Procedures: BMP, Mag  Follow-Up: At [facility], you and your health needs are our priority.  As part of our continuing mission to provide you with exceptional heart care, we have created designated Provider Care Teams.  These Care Teams include your primary Cardiologist (physician) and Advanced Practice Providers (APPs -  Physician Assistants and Nurse Practitioners) who all work together to provide you with the care you need, when you need it.  Your physician wants you to follow-up in: 6 months with  one of the following Advanced Practice Providers on your designated Care Team:    Tommye Standard, [place] Legrand Como "Jonni Sanger" [place], [place]   You will receive a reminder letter in the mail two months in advance. If you don't receive a letter, please call our office to schedule the follow-up appointment.  Remote monitoring is used to monitor your ICD from home. This monitoring reduces the number of office visits required to check your device to one time per year. It allows [place] to keep an eye on the functioning of your device to ensure it is working properly. You are scheduled for a device check from home on 04/08/21. You may send your transmission at any time that day. If you have a wireless  device, the transmission will be sent automatically. After your physician reviews your transmission, you will receive a postcard with your next transmission date.  We recommend signing up for the patient portal called "MyChart".  Sign up information is provided on this After Visit Summary.  MyChart is used to connect with patients for Virtual Visits (Telemedicine).  Patients are able to view lab/test results, encounter notes, upcoming appointments, etc.  Non-urgent messages can be sent to your provider as well.   To learn more about what you can do with MyChart, go to [email or web address].    Any Other Special Instructions Will Be Listed Below (If Applicable).

## 2021-02-12 NOTE — Progress Notes (Signed)
Electrophysiology Office Follow up Visit Note:    Date:  02/12/2021   ID:  Devin Hanna, DOB 1950/05/02, MRN [id number]  PCP:  [name], MD  [place] Cardiologist:  [name], MD  [facility] Electrophysiologist:  Vickie Epley, MD    Interval History:    Devin Hanna is a 71 y.o. male who presents for a follow up visit. They were last seen in clinic January 22, 2021 for chronic systolic heart failure.  He has previously had a shock from his biventricular ICD.  He presents today because of a recent ICD shock.  He also was hospitalized recently for acute heart failure symptoms.  He tells me that his energy level has been lower than normal recently.  His episode of VF was on January 1.  He received 1 shock which successfully converted him back to a biventricular paced rhythm.  He was sitting on the couch and the shock jumped him out of the couch and onto the floor where he injured his back.  No clear fluid retention that he is aware of in the legs or abdomen although he has felt better with an increased dose of Lasix.  The increased dose was prescribed by the emergency room physician.  He is accompanied by family today.  He is not driving.       Past Medical History:  Diagnosis Date   Acute respiratory failure with hypoxia ([place]) 06/2016   CAD (coronary artery disease)    a. Remote PCI of the RCA;  b. 03/2013 Inf STEMI/PCI: LAD 60-70d, RCA 99p ISR(rota/CBA/DES);  c. 09/2014 MV: inf and infsept infarct, EF 35-40%;  d. 09/2014 Cath: patent RCA stent, stable LAD dzs, EF 40%-->Med rx.   CHF (congestive heart failure) ([place])    Diabetes mellitus    sees Dr. Hardin Negus in [place]   GERD (gastroesophageal reflux disease)    HFrEF (heart failure with reduced ejection fraction) ([place])    Hyperlipidemia    Hypertension    Ischemic cardiomyopathy    a. 09/2014 EF 40% by V gram;  b. 06/2016 Echo: EF 40, diff HK, Gr1 DD, inf HK, no mural thrombus. Mild MR, mildly dil LA,  mildly dil RV.    LBBB (left bundle branch block)    Old   Liver mass    Morbid obesity ([place])    Peripheral neuropathy    Upper GI bleed    Vertigo     Past Surgical History:  Procedure Laterality Date   BIV ICD INSERTION CRT-D N/A 04/08/2020   Procedure: BIV ICD INSERTION CRT-D;  Surgeon: Vickie Epley, MD;  Location: [place] CV LAB;  Service: Cardiovascular;  Laterality: N/A;   CARDIAC CATHETERIZATION N/A 10/03/2014   Procedure: Left Heart Cath and Coronary Angiography;  Surgeon: [name], MD;  Location: [place] CV LAB;  Service: Cardiovascular;  Laterality: N/A;   CIRCUMCISION N/A 07/22/2015   Procedure: CIRCUMCISION ADULT;  Surgeon: Hollice Espy, MD;  Location: [facility] ORS;  Service: Urology;  Laterality: N/A;   COLONOSCOPY WITH PROPOFOL N/A 01/12/2019   Procedure: COLONOSCOPY WITH PROPOFOL;  Surgeon: Lucilla Lame, MD;  Location: [facility] ENDOSCOPY;  Service: Endoscopy;  Laterality: N/A;   CORONARY STENT PLACEMENT     ESOPHAGOGASTRODUODENOSCOPY (EGD) WITH PROPOFOL N/A 09/12/2014   Procedure: ESOPHAGOGASTRODUODENOSCOPY (EGD) WITH PROPOFOL;  Surgeon: Ronald Lobo, MD;  Location: [facility] ENDOSCOPY;  Service: Endoscopy;  Laterality: N/A;   EXTERNAL FIXATION LEG Left 04/03/2012   Procedure: EXTERNAL FIXATION LEG;  Surgeon:  Wylene Simmer, MD;  Location: [place];  Service: Orthopedics;  Laterality: Left;   EXTERNAL FIXATION REMOVAL Left 04/14/2012   Procedure: REMOVAL EXTERNAL FIXATION LEG;  Surgeon: Wylene Simmer, MD;  Location: [place];  Service: Orthopedics;  Laterality: Left;   IR 3D INDEPENDENT WKST  10/16/2020   IR 3D INDEPENDENT WKST  10/30/2020   IR ANGIOGRAM SELECTIVE EACH ADDITIONAL VESSEL  10/16/2020   IR ANGIOGRAM SELECTIVE EACH ADDITIONAL VESSEL  10/30/2020   IR ANGIOGRAM VISCERAL SELECTIVE  10/16/2020   IR ANGIOGRAM VISCERAL SELECTIVE  10/30/2020   IR EMBO ARTERIAL NOT HEMORR HEMANG INC GUIDE ROADMAPPING  10/16/2020   IR EMBO TUMOR ORGAN ISCHEMIA INFARCT INC GUIDE ROADMAPPING  10/30/2020    IR RADIOLOGIST EVAL & MGMT  09/06/2020   IR RADIOLOGIST EVAL & MGMT  11/27/2020   IR RADIOLOGIST EVAL & MGMT  01/23/2021   IR US GUIDE VASC ACCESS RIGHT  10/16/2020   IR US GUIDE VASC ACCESS RIGHT  10/30/2020   IRRIGATION AND DEBRIDEMENT KNEE Left 04/03/2012   Procedure: IRRIGATION AND DEBRIDEMENT KNEE;  Surgeon: Wylene Simmer, MD;  Location: [place];  Service: Orthopedics;  Laterality: Left;   LEFT HEART CATHETERIZATION WITH CORONARY ANGIOGRAM N/A 04/05/2013   Procedure: LEFT HEART CATHETERIZATION WITH CORONARY ANGIOGRAM;  Surgeon: [name], MD;  Location: [place] CATH LAB;  Service: Cardiovascular;  Laterality: N/A;   MASS EXCISION N/A 07/22/2015   Procedure: EXCISION PENILE MASS;  Surgeon: Hollice Espy, MD;  Location: [facility] ORS;  Service: Urology;  Laterality: N/A;   ORIF TIBIA PLATEAU Left 04/14/2012   Procedure: OPEN REDUCTION INTERNAL FIXATION (ORIF) TIBIAL PLATEAU;  Surgeon: Wylene Simmer, MD;  Location: [place];  Service: Orthopedics;  Laterality: Left;   PERCUTANEOUS CORONARY ROTOBLATOR INTERVENTION (PCI-R) N/A 04/06/2013   Procedure: PERCUTANEOUS CORONARY ROTOBLATOR INTERVENTION (PCI-R);  Surgeon: [name], MD;  Location: [facility] CATH LAB;  Service: Cardiovascular;  Laterality: N/A;   RIGHT/LEFT HEART CATH AND CORONARY ANGIOGRAPHY N/A 08/21/2019   Procedure: RIGHT/LEFT HEART CATH AND CORONARY ANGIOGRAPHY;  Surgeon: [name], MD;  Location: [place] CV LAB;  Service: Cardiovascular;  Laterality: N/A;    Current Medications: Current Meds  Medication Sig   atorvastatin (LIPITOR) 40 MG tablet TAKE ONE TABLET BY MOUTH EVERY DAY   CINNAMON PO Take 1,000 mg by mouth in the morning and at bedtime.   clopidogrel (PLAVIX) 75 MG tablet TAKE ONE TABLET EVERY MORNING WITH BREAKFAST   dapagliflozin propanediol ([name]) 10 MG TABS tablet Take 1 tablet (10 mg total) by mouth daily before breakfast.   Dulaglutide ([name]) 3 YT/0.3TW SOPN Inject 3 mg as directed once a week.   ferrous  sulfate 325 (65 FE) MG tablet Take 162.5 mg by mouth daily with breakfast.   furosemide (LASIX) 40 MG tablet Take 1 tablet (40 mg total) by mouth daily.   gabapentin (NEURONTIN) 300 MG capsule Take 1 capsule (300 mg total) by mouth every morning AND 1 capsule (300 mg total) daily with lunch AND 2 capsules (600 mg total) at bedtime.   metoprolol succinate (TOPROL-XL) 50 MG 24 hr tablet Take 1.5 tablets (75 mg total) by mouth in the morning and at bedtime. Take with or immediately following a meal.   mexiletine (MEXITIL) 150 MG capsule Take 1 capsule (150 mg total) by mouth 3 (three) times daily.   Multiple Vitamin (MULTIVITAMIN WITH MINERALS) TABS Take 1 tablet by mouth daily. Multivitamin for Men 50+   nitroGLYCERIN (NITROSTAT) 0.4 MG SL tablet Place 1 tablet (0.4  mg total) under the tongue every 5 (five) minutes as needed for chest pain.   pantoprazole (PROTONIX) 40 MG tablet TAKE 1 TABLET BY MOUTH TWICE DAILY.   sacubitril-valsartan (ENTRESTO) 24-26 MG Take 1 tablet by mouth 2 (two) times daily. (Patient taking differently: Take 0.5 tablets by mouth 2 (two) times daily.)   [DISCONTINUED] carvedilol (COREG) 12.5 MG tablet Take 1 tablet (12.5 mg total) by mouth 2 (two) times daily.   [DISCONTINUED] mexiletine (MEXITIL) 150 MG capsule Take 1 capsule (150 mg total) by mouth 2 (two) times daily.     Allergies:   Patient has no known allergies.   Social History   Socioeconomic History   Marital status: Divorced    Spouse name: Not on file   Number of children: Not on file   Years of education: Not on file   Highest education level: Not on file  Occupational History   Occupation: heavy Company secretary    Employer: [employer]  Tobacco Use   Smoking status: Former    Packs/day: 3.00    Years: 40.00    Pack years: 120.00    Types: Cigarettes    Quit date: 04/05/1995    Years since quitting: 25.8   Smokeless tobacco: Former   Tobacco comments:    started smoking at age 14  Vaping  Use   Vaping Use: Never used  Substance and Sexual Activity   Alcohol use: No    Comment: Occassional Use   Drug use: No   Sexual activity: Not on file  Other Topics Concern   Not on file  Social History Narrative   Heavy Company secretary.; Divorced; quit smoking 25 years; beer rare; by self. Still riding motor cycles.    Social Determinants of Health   Financial Resource Strain: Medium Risk   Difficulty of Paying Living Expenses: Somewhat hard  Food Insecurity: No Food Insecurity   Worried About Charity fundraiser in the Last Year: Never true   Ran Out of Food in the Last Year: Never true  Transportation Needs: No Transportation Needs   Lack of Transportation (Medical): No   Lack of Transportation (Non-Medical): No  Physical Activity: Not on file  Stress: No Stress Concern Present   Feeling of Stress : Not at all  Social Connections: Unknown   Frequency of Communication with Friends and Family: More than three times a week   Frequency of Social Gatherings with Friends and Family: Not on file   Attends Religious Services: Not on file   Active Member of Clubs or Organizations: Not on file   Attends Archivist Meetings: Not on file   Marital Status: Not on file     Family History: The patient's family history includes Breast cancer in his sister; Coronary artery disease in his brother and father; Lung cancer in his brother; Prostate cancer in his brother; Stroke in his sister.  ROS:   Please see the history of present illness.    All other systems reviewed and are negative.  EKGs/Labs/Other Studies Reviewed:    The following studies were reviewed today:  February 12, 2021 in clinic device interrogation personally reviewed Battery longevity 6 years Lead parameter stable Episode of VT/VF on February 09, 2021 with a delivered ATP scheme followed by shock.  ATP was unsuccessful but the shock successfully converted him to a biventricular paced rhythm.  Lead outputs  were adjusted today to maximize battery longevity.  He is biventricular paced 96%.  EKG:  The ekg  ordered today demonstrates atrial fibrillation, biventricular paced rhythm  Recent Labs: 07/05/2020: TSH 3.420 10/23/2020: Magnesium 2.1 12/16/2020: ALT 34 02/08/2021: B Natriuretic Peptide 599.5; BUN 13; Creatinine, Ser 1.53; Hemoglobin 15.6; Platelets 141; Potassium 3.6; Sodium 139  Recent Lipid Panel    Component Value Date/Time   CHOL 103 06/26/2020 0816   CHOL 103 06/07/2014 0824   TRIG 159.0 (H) 06/26/2020 0816   HDL 34.50 (L) 06/26/2020 0816   HDL 38 (L) 06/07/2014 0824   CHOLHDL 3 06/26/2020 0816   VLDL 31.8 06/26/2020 0816   LDLCALC 37 06/26/2020 0816   LDLCALC 59 03/30/2018 0930    Physical Exam:    VS:  BP 130/72    Pulse 69    Ht 5' 10"  (1.778 m)    Wt 226 lb 3.2 oz (102.6 kg)    SpO2 95%    BMI 32.46 kg/m     Wt Readings from Last 3 Encounters:  02/12/21 226 lb 3.2 oz (102.6 kg)  01/22/21 236 lb 9.6 oz (107.3 kg)  11/21/20 236 lb 12.8 oz (107.4 kg)     GEN: Elderly, obese, no acute distress in wheelchair HEENT: Normal NECK: No JVD; No carotid bruits LYMPHATICS: No lymphadenopathy CARDIAC: RRR, no murmurs, rubs, gallops.  Defibrillator pocket well-healed.  Pulses in the left lower extremity intact and similar to right lower extremity.  Check pulses because of discoloration in his second and third toe.  There is good cap refill in both of these toes. RESPIRATORY:  Clear to auscultation without rales, wheezing or rhonchi  ABDOMEN: Soft, non-tender, non-distended MUSCULOSKELETAL:  No edema; No deformity  SKIN: Warm and dry NEUROLOGIC:  Alert and oriented x 3 PSYCHIATRIC:  Normal affect        ASSESSMENT:    1. Chronic systolic heart failure ([place])   2. ICD (implantable cardioverter-defibrillator) in place   3. Cardiac resynchronization therapy defibrillator (CRT-D) in place   4. Permanent atrial fibrillation ([facility])    PLAN:    In order of problems listed  above:   #Chronic systolic heart failure NYHA class II-III.  Warm and dry on exam.  He tells me that he feels better on an increased dose of Lasix.  I would like him to go up to 40 mg by mouth twice daily.  We will check his blood work today to make sure his electrolytes are stable.  #ICD/CRT-D in situ #Ventricular tachycardia/fibrillation Successfully treated with defibrillator.  I would like to increase his mexiletine to 150 mg by mouth 3 times a day.  I would also like to transition him from Coreg to Toprol-XL to hopefully get more beta-blockade without impacting his blood pressure significantly.  He will stop his Coreg today and start Toprol-XL 75 mg by mouth twice daily.  This should be uptitrated on subsequent visits as long as his blood pressure tolerates.  #Permanent atrial fibrillation Not currently anticoagulated because of history of bleeding.  This has been discussed in detail during previous visits.  Follow-up 6 months or sooner as needed.         Total time spent with patient today 48 minutes. This includes reviewing records, evaluating the patient and coordinating care.   Medication Adjustments/Labs and Tests Ordered: Current medicines are reviewed at length with the patient today.  Concerns regarding medicines are outlined above.  Orders Placed This Encounter  Procedures   Basic Metabolic Panel (BMET)   Magnesium   Meds ordered this encounter  Medications   metoprolol succinate (TOPROL-XL) 50 MG 24  hr tablet    Sig: Take 1.5 tablets (75 mg total) by mouth in the morning and at bedtime. Take with or immediately following a meal.    Dispense:  270 tablet    Refill:  3   mexiletine (MEXITIL) 150 MG capsule    Sig: Take 1 capsule (150 mg total) by mouth 3 (three) times daily.    Dispense:  270 capsule    Refill:  3     Signed, Lars Mage, MD, [facility] 02/12/2021 3:30 PM    Electrophysiology [facility]

## 2021-02-13 ENCOUNTER — Telehealth: Payer: Self-pay | Admitting: Cardiology

## 2021-02-13 ENCOUNTER — Telehealth: Payer: Self-pay | Admitting: *Deleted

## 2021-02-13 MED ORDER — POTASSIUM CHLORIDE CRYS ER 20 MEQ PO TBCR: 20.0000 meq | EXTENDED_RELEASE_TABLET | Freq: Every day | ORAL | 3 refills | Status: AC

## 2021-02-13 NOTE — Telephone Encounter (Signed)
Patient's daughter returning call for lab results.

## 2021-02-13 NOTE — Telephone Encounter (Signed)
-----   Message from Vickie Epley, MD sent at 02/12/2021  8:28 PM EST ----- Labwork reviewed. Potassium is slightly low. We will need to start Potassium supplementation.  Otila Kluver, please send in a prescription for Potassium chloride (Kdur) 36mq by mouth once daily.   CLysbeth GalasT. LQuentin Ore MD, FWellstar [facility] FGreater Dayton Surgery CenterCardiac Electrophysiology

## 2021-02-13 NOTE — Telephone Encounter (Signed)
The patient's daughter [facility]) has been notified of the result and verbalized understanding.  All questions (if any) were answered. Darrell Jewel, RN 02/13/2021 9:16 AM    Prescription sent to pharmacy.

## 2021-02-13 NOTE — Telephone Encounter (Signed)
See result note.  

## 2021-02-14 ENCOUNTER — Other Ambulatory Visit (INDEPENDENT_AMBULATORY_CARE_PROVIDER_SITE_OTHER): Payer: PPO | Admitting: *Deleted

## 2021-02-14 DIAGNOSIS — I5022 Chronic systolic (congestive) heart failure: Secondary | ICD-10-CM

## 2021-02-14 DIAGNOSIS — I4821 Permanent atrial fibrillation: Secondary | ICD-10-CM

## 2021-02-17 ENCOUNTER — Telehealth: Payer: Self-pay | Admitting: Family Medicine

## 2021-02-17 NOTE — Telephone Encounter (Signed)
Pt daughter called in stating that pt has has cough for a week. Pt went to ER on new years eve and was told to take dessulym and its not helping anymore. Pt daughter said it did work for a couple days. Pt was seen by cardiologist on Wednesday and was tested for covid flu pneumonia and it was negative.  Clifton Kovacic,cma

## 2021-02-17 NOTE — Telephone Encounter (Signed)
Pt daughter called in stating that pt has has cough for a week. Pt went to ER on new years eve and was told to take dessulym and its not helping anymore. Pt daughter said it did work for a couple days. Pt was seen by cardiologist on Wednesday and was tested for covid flu pneumonia and it was negative

## 2021-02-18 ENCOUNTER — Ambulatory Visit (INDEPENDENT_AMBULATORY_CARE_PROVIDER_SITE_OTHER): Payer: PPO | Admitting: Family

## 2021-02-18 ENCOUNTER — Ambulatory Visit: Payer: PPO | Admitting: Internal Medicine

## 2021-02-18 ENCOUNTER — Encounter: Payer: Self-pay | Admitting: Family

## 2021-02-18 ENCOUNTER — Ambulatory Visit (INDEPENDENT_AMBULATORY_CARE_PROVIDER_SITE_OTHER): Payer: PPO

## 2021-02-18 ENCOUNTER — Other Ambulatory Visit: Payer: Self-pay

## 2021-02-18 VITALS — BP 124/68 | HR 72 | Temp 97.8°F | Ht 70.0 in | Wt 226.8 lb

## 2021-02-18 DIAGNOSIS — K5900 Constipation, unspecified: Secondary | ICD-10-CM | POA: Diagnosis not present

## 2021-02-18 DIAGNOSIS — I25118 Atherosclerotic heart disease of native coronary artery with other forms of angina pectoris: Secondary | ICD-10-CM

## 2021-02-18 DIAGNOSIS — R5383 Other fatigue: Secondary | ICD-10-CM | POA: Diagnosis not present

## 2021-02-18 DIAGNOSIS — M545 Low back pain, unspecified: Secondary | ICD-10-CM | POA: Diagnosis not present

## 2021-02-18 DIAGNOSIS — I509 Heart failure, unspecified: Secondary | ICD-10-CM | POA: Diagnosis not present

## 2021-02-18 LAB — BASIC METABOLIC PANEL
BUN: 17 mg/dL (ref 6–23)
CO2: 29 mEq/L (ref 19–32)
Calcium: 9 mg/dL (ref 8.4–10.5)
Chloride: 102 mEq/L (ref 96–112)
Creatinine, Ser: 1.3 mg/dL (ref 0.40–1.50)
GFR: 54.74 mL/min — ABNORMAL LOW (ref >60.00)
Glucose, Bld: 105 mg/dL — ABNORMAL HIGH (ref 70–99)
Potassium: 4 mEq/L (ref 3.5–5.1)
Sodium: 141 mEq/L (ref 135–145)

## 2021-02-18 LAB — CBC WITH DIFFERENTIAL/PLATELET
Basophils Absolute: 0 10*3/uL (ref 0.0–0.1)
Basophils Relative: 0.3 % (ref 0.0–3.0)
Eosinophils Absolute: 0.1 10*3/uL (ref 0.0–0.7)
Eosinophils Relative: 1.4 % (ref 0.0–5.0)
HCT: 50.2 % (ref 39.0–52.0)
Hemoglobin: 16.5 g/dL (ref 13.0–17.0)
Lymphocytes Relative: 6.3 % — ABNORMAL LOW (ref 12.0–46.0)
Lymphs Abs: 0.5 10*3/uL — ABNORMAL LOW (ref 0.7–4.0)
MCHC: 32.8 g/dL (ref 30.0–36.0)
MCV: 98.2 fl (ref 78.0–100.0)
Monocytes Absolute: 0.6 10*3/uL (ref 0.1–1.0)
Monocytes Relative: 6.4 % (ref 3.0–12.0)
Neutro Abs: 7.5 10*3/uL (ref 1.4–7.7)
Neutrophils Relative %: 85.6 % — ABNORMAL HIGH (ref 43.0–77.0)
Platelets: 191 10*3/uL (ref 150.0–400.0)
RBC: 5.12 Mil/uL (ref 4.22–5.81)
RDW: 14 % (ref 11.5–15.5)
WBC: 8.7 10*3/uL (ref 4.0–10.5)

## 2021-02-18 LAB — BRAIN NATRIURETIC PEPTIDE: Pro B Natriuretic peptide (BNP): 548 pg/mL — ABNORMAL HIGH (ref 0.0–100.0)

## 2021-02-18 NOTE — Progress Notes (Signed)
Acute Office Visit  Subjective:    Patient ID: Devin Hanna, male    DOB: [birth year], 71 y.o.   MRN: [id number]  Chief Complaint  Patient presents with   Cough   Fatigue    HPI Patient is in today accompanied by his daughter with c/o fatigue, weakness x 9 days, constipation x 1 week and cough that as recently begin to improve. Reports taking 2 stool softeners that helped produce a loose, brown bowel movement yesterday but still does not feel emptied. Reports appetite has been decreased and recently begin to pick up. No abdominal pain. Denies any heart palpitations or SOB. Saw cardiology recently as a follow-up on Jan 4 after his biventricular ICD shocked him. He reports the shock throwing his to the floor and he has had back pain since that time that comes and goes. Had a BNP of 599 at that office visit and his Lasix was doubled. Back pain typically worse in the morning but better as the day progresses.   Last colonoscopy Dec 2020, due for a repeat in 5 years.  Past Medical History:  Diagnosis Date   Acute respiratory failure with hypoxia ([place]) 06/2016   CAD (coronary artery disease)    a. Remote PCI of the RCA;  b. 03/2013 Inf STEMI/PCI: LAD 60-70d, RCA 99p ISR(rota/CBA/DES);  c. 09/2014 MV: inf and infsept infarct, EF 35-40%;  d. 09/2014 Cath: patent RCA stent, stable LAD dzs, EF 40%-->Med rx.   CHF (congestive heart failure) ([place])    Diabetes mellitus    sees Dr. Hardin Negus in [place]   GERD (gastroesophageal reflux disease)    HFrEF (heart failure with reduced ejection fraction) ([place])    Hyperlipidemia    Hypertension    Ischemic cardiomyopathy    a. 09/2014 EF 40% by V gram;  b. 06/2016 Echo: EF 40, diff HK, Gr1 DD, inf HK, no mural thrombus. Mild MR, mildly dil LA, mildly dil RV.    LBBB (left bundle branch block)    Old   Liver mass    Morbid obesity ([place])    Peripheral neuropathy    Upper GI bleed    Vertigo     Past Surgical History:  Procedure  Laterality Date   BIV ICD INSERTION CRT-D N/A 04/08/2020   Procedure: BIV ICD INSERTION CRT-D;  Surgeon: Vickie Epley, MD;  Location: [facility];  Service: Cardiovascular;  Laterality: N/A;   CARDIAC CATHETERIZATION N/A 10/03/2014   Procedure: Left Heart Cath and Coronary Angiography;  Surgeon: [name], MD;  Location: [facility];  Service: Cardiovascular;  Laterality: N/A;   CIRCUMCISION N/A 07/22/2015   Procedure: CIRCUMCISION ADULT;  Surgeon: Hollice Espy, MD;  Location: [facility];  Service: Urology;  Laterality: N/A;   COLONOSCOPY WITH PROPOFOL N/A 01/12/2019   Procedure: COLONOSCOPY WITH PROPOFOL;  Surgeon: Lucilla Lame, MD;  Location: [facility] ENDOSCOPY;  Service: Endoscopy;  Laterality: N/A;   CORONARY STENT PLACEMENT     ESOPHAGOGASTRODUODENOSCOPY (EGD) WITH PROPOFOL N/A 09/12/2014   Procedure: ESOPHAGOGASTRODUODENOSCOPY (EGD) WITH PROPOFOL;  Surgeon: Ronald Lobo, MD;  Location: [facility] ENDOSCOPY;  Service: Endoscopy;  Laterality: N/A;   EXTERNAL FIXATION LEG Left 04/03/2012   Procedure: EXTERNAL FIXATION LEG;  Surgeon: Wylene Simmer, MD;  Location: [employer];  Service: Orthopedics;  Laterality: Left;   EXTERNAL FIXATION REMOVAL Left 04/14/2012   Procedure: REMOVAL EXTERNAL FIXATION LEG;  Surgeon: Wylene Simmer, MD;  Location: [facility];  Service: Orthopedics;  Laterality: Left;   IR 3D  INDEPENDENT WKST  10/16/2020   IR 3D INDEPENDENT WKST  10/30/2020   IR ANGIOGRAM SELECTIVE EACH ADDITIONAL VESSEL  10/16/2020   IR ANGIOGRAM SELECTIVE EACH ADDITIONAL VESSEL  10/30/2020   IR ANGIOGRAM VISCERAL SELECTIVE  10/16/2020   IR ANGIOGRAM VISCERAL SELECTIVE  10/30/2020   IR EMBO ARTERIAL NOT HEMORR HEMANG INC GUIDE ROADMAPPING  10/16/2020   IR EMBO TUMOR ORGAN ISCHEMIA INFARCT INC GUIDE ROADMAPPING  10/30/2020   IR RADIOLOGIST EVAL & MGMT  09/06/2020   IR RADIOLOGIST EVAL & MGMT  11/27/2020   IR RADIOLOGIST EVAL & MGMT  01/23/2021   IR US GUIDE VASC ACCESS RIGHT  10/16/2020   IR  US GUIDE VASC ACCESS RIGHT  10/30/2020   IRRIGATION AND DEBRIDEMENT KNEE Left 04/03/2012   Procedure: IRRIGATION AND DEBRIDEMENT KNEE;  Surgeon: Wylene Simmer, MD;  Location: [facility];  Service: Orthopedics;  Laterality: Left;   LEFT HEART CATHETERIZATION WITH CORONARY ANGIOGRAM N/A 04/05/2013   Procedure: LEFT HEART CATHETERIZATION WITH CORONARY ANGIOGRAM;  Surgeon: [name], MD;  Location: [facility];  Service: Cardiovascular;  Laterality: N/A;   MASS EXCISION N/A 07/22/2015   Procedure: EXCISION PENILE MASS;  Surgeon: Hollice Espy, MD;  Location: [facility];  Service: Urology;  Laterality: N/A;   ORIF TIBIA PLATEAU Left 04/14/2012   Procedure: OPEN REDUCTION INTERNAL FIXATION (ORIF) TIBIAL PLATEAU;  Surgeon: Wylene Simmer, MD;  Location: [place] [facility];  Service: Orthopedics;  Laterality: Left;   PERCUTANEOUS CORONARY ROTOBLATOR INTERVENTION (PCI-R) N/A 04/06/2013   Procedure: PERCUTANEOUS CORONARY ROTOBLATOR INTERVENTION (PCI-R);  Surgeon: [name], MD;  Location: [facility] CATH LAB;  Service: Cardiovascular;  Laterality: N/A;   RIGHT/LEFT HEART CATH AND CORONARY ANGIOGRAPHY N/A 08/21/2019   Procedure: RIGHT/LEFT HEART CATH AND CORONARY ANGIOGRAPHY;  Surgeon: [name], MD;  Location: [facility];  Service: Cardiovascular;  Laterality: N/A;    Family History  Problem Relation Age of Onset   Coronary artery disease Father    Coronary artery disease Brother    Lung cancer Brother    Breast cancer Sister    Prostate cancer Brother    Stroke Sister     Social History   Socioeconomic History   Marital status: Divorced    Spouse name: Not on file   Number of children: Not on file   Years of education: Not on file   Highest education level: Not on file  Occupational History   Occupation: heavy Company secretary    Employer: [employer]  Tobacco Use   Smoking status: Former    Packs/day: 3.00    Years: 40.00    Pack years: 120.00    Types: Cigarettes     Quit date: 04/05/1995    Years since quitting: 25.8   Smokeless tobacco: Former   Tobacco comments:    started smoking at age 21  Vaping Use   Vaping Use: Never used  Substance and Sexual Activity   Alcohol use: No    Comment: Occassional Use   Drug use: No   Sexual activity: Not on file  Other Topics Concern   Not on file  Social History Narrative   Heavy Company secretary.; Divorced; quit smoking 25 years; beer rare; by self. Still riding motor cycles.    Social Determinants of Health   Financial Resource Strain: Medium Risk   Difficulty of Paying Living Expenses: Somewhat hard  Food Insecurity: No Food Insecurity   Worried About Running Out of Food in the Last Year: Never true  Ran Out of Food in the Last Year: Never true  Transportation Needs: No Transportation Needs   Lack of Transportation (Medical): No   Lack of Transportation (Non-Medical): No  Physical Activity: Not on file  Stress: No Stress Concern Present   Feeling of Stress : Not at all  Social Connections: Unknown   Frequency of Communication with Friends and Family: More than three times a week   Frequency of Social Gatherings with Friends and Family: Not on file   Attends Religious Services: Not on file   Active Member of Clubs or Organizations: Not on file   Attends Archivist Meetings: Not on file   Marital Status: Not on file  Intimate Partner Violence: Not At Risk   Fear of Current or Ex-Partner: No   Emotionally Abused: No   Physically Abused: No   Sexually Abused: No    Outpatient Medications Prior to Visit  Medication Sig Dispense Refill   atorvastatin (LIPITOR) 40 MG tablet TAKE ONE TABLET BY MOUTH EVERY DAY 90 tablet 0   CINNAMON PO Take 1,000 mg by mouth in the morning and at bedtime.     clopidogrel (PLAVIX) 75 MG tablet TAKE ONE TABLET EVERY MORNING WITH BREAKFAST 90 tablet 3   dapagliflozin propanediol ([name]) 10 MG TABS tablet Take 1 tablet (10 mg  total) by mouth daily before breakfast. 90 tablet 3   Dulaglutide ([name]) 3 GB/1.5VV SOPN Inject 3 mg as directed once a week. 6 mL 1   ferrous sulfate 325 (65 FE) MG tablet Take 162.5 mg by mouth daily with breakfast.     furosemide (LASIX) 40 MG tablet Take 1 tablet (40 mg total) by mouth daily. 90 tablet 1   gabapentin (NEURONTIN) 300 MG capsule Take 1 capsule (300 mg total) by mouth every morning AND 1 capsule (300 mg total) daily with lunch AND 2 capsules (600 mg total) at bedtime. 360 capsule 3   metoprolol succinate (TOPROL-XL) 50 MG 24 hr tablet Take 1.5 tablets (75 mg total) by mouth in the morning and at bedtime. Take with or immediately following a meal. 270 tablet 3   mexiletine (MEXITIL) 150 MG capsule Take 1 capsule (150 mg total) by mouth 3 (three) times daily. 270 capsule 3   Multiple Vitamin (MULTIVITAMIN WITH MINERALS) TABS Take 1 tablet by mouth daily. Multivitamin for Men 50+     nitroGLYCERIN (NITROSTAT) 0.4 MG SL tablet Place 1 tablet (0.4 mg total) under the tongue every 5 (five) minutes as needed for chest pain. 25 tablet 0   pantoprazole (PROTONIX) 40 MG tablet TAKE 1 TABLET BY MOUTH TWICE DAILY. 180 tablet 3   potassium chloride SA (KLOR-CON M) 20 MEQ tablet Take 1 tablet (20 mEq total) by mouth daily. 90 tablet 3   sacubitril-valsartan (ENTRESTO) 24-26 MG Take 1 tablet by mouth 2 (two) times daily. (Patient taking differently: Take 0.5 tablets by mouth 2 (two) times daily.)     No facility-administered medications prior to visit.    No Known Allergies  Review of Systems  Constitutional:  Positive for fatigue.  HENT: Negative.    Respiratory:  Positive for cough.   Cardiovascular:  Negative for palpitations.  Gastrointestinal:  Positive for anal bleeding and constipation. Negative for abdominal distention, blood in stool and rectal pain.  Endocrine: Negative.   Musculoskeletal:  Positive for back pain.  Skin: Negative.   Allergic/Immunologic:  Negative.   Neurological: Negative.   Psychiatric/Behavioral: Negative.    All other systems reviewed and are  negative.     Objective:    Physical Exam Vitals reviewed.  Constitutional:      Appearance: Normal appearance.  HENT:     Right Ear: Tympanic membrane and ear canal normal.     Left Ear: Tympanic membrane and ear canal normal.  Cardiovascular:     Rate and Rhythm: Normal rate and regular rhythm.  Pulmonary:     Effort: Pulmonary effort is normal.     Breath sounds: Normal breath sounds.  Abdominal:     General: Abdomen is flat.     Palpations: Abdomen is soft.  Musculoskeletal:        General: Normal range of motion.     Cervical back: Normal range of motion.  Skin:    General: Skin is warm and dry.  Neurological:     General: No focal deficit present.     Mental Status: He is alert and oriented to person, place, and time.  Psychiatric:        Mood and Affect: Mood normal.        Behavior: Behavior normal.   BP 124/68    Pulse 72    Temp 97.8 F (36.6 C) (Oral)    Ht 5' 10"  (1.778 m)    Wt 226 lb 12.8 oz (102.9 kg)    SpO2 96%    BMI 32.54 kg/m  Wt Readings from Last 3 Encounters:  02/18/21 226 lb 12.8 oz (102.9 kg)  02/12/21 226 lb 3.2 oz (102.6 kg)  01/22/21 236 lb 9.6 oz (107.3 kg)    Health Maintenance Due  Topic Date Due   OPHTHALMOLOGY EXAM  05/27/2018    There are no preventive care reminders to display for this patient.   Lab Results  Component Value Date   TSH 3.420 07/05/2020   Lab Results  Component Value Date   WBC 7.7 02/08/2021   HGB 15.6 02/08/2021   HCT 47.0 02/08/2021   MCV 99.6 02/08/2021   PLT 141 (L) 02/08/2021   Lab Results  Component Value Date   NA 145 (H) 02/12/2021   K 3.6 02/12/2021   CO2 24 02/12/2021   GLUCOSE 139 (H) 02/12/2021   BUN 27 02/12/2021   CREATININE 1.32 (H) 02/12/2021   BILITOT 1.3 (H) 12/16/2020   ALKPHOS 137 (H) 10/30/2020   AST 47 (H) 12/16/2020   ALT 34 12/16/2020   PROT 6.2 12/16/2020    ALBUMIN 3.6 10/30/2020   CALCIUM 9.0 02/12/2021   ANIONGAP 9 02/08/2021   EGFR 57 (L) 02/12/2021   GFR 47.35 (L) 10/04/2020   Lab Results  Component Value Date   CHOL 103 06/26/2020   Lab Results  Component Value Date   HDL 34.50 (L) 06/26/2020   Lab Results  Component Value Date   LDLCALC 37 06/26/2020   Lab Results  Component Value Date   TRIG 159.0 (H) 06/26/2020   Lab Results  Component Value Date   CHOLHDL 3 06/26/2020   Lab Results  Component Value Date   HGBA1C 6.4 10/04/2020       Assessment & Plan:   Problem List Items Addressed This Visit     CAD (coronary artery disease)   Relevant Orders   Basic Metabolic Panel (BMET)   Other Visit Diagnoses     Other fatigue    -  Primary   Relevant Orders   B Nat Peptide   CBC w/Diff   Congestive heart failure, unspecified HF chronicity, unspecified heart failure type ([place])  Relevant Orders   Basic Metabolic Panel (BMET)   B Nat Peptide   Constipation, unspecified constipation type       Relevant Orders   Basic Metabolic Panel (BMET)   CBC w/Diff   DG Abd 1 View   Acute low back pain without sciatica, unspecified back pain laterality       Relevant Orders   POC Urinalysis Dipstick   CBC w/Diff        Plan: Labs forwarded to Cardiology to review BNP. Miralax twice a day for constipation. Xray obtained.Call the office if symptoms worsen or persist. Recheck as scheduled ans sooner as needed.   Kennyth Arnold, FNP

## 2021-02-18 NOTE — Telephone Encounter (Signed)
I called and spoke with the patients daughter and te patient is scheduled with Dr. Olivia Mackie Mclean-Scoccuza today. Brooklyn Alfredo,cma

## 2021-02-18 NOTE — Telephone Encounter (Signed)
Patient assistance for Entresto approved through 02/08/22. Letter filed in the patient assistance file cabinet.

## 2021-02-19 ENCOUNTER — Ambulatory Visit: Payer: PPO | Admitting: Pharmacist

## 2021-02-19 ENCOUNTER — Other Ambulatory Visit: Payer: Self-pay | Admitting: Family

## 2021-02-19 DIAGNOSIS — I5022 Chronic systolic (congestive) heart failure: Secondary | ICD-10-CM

## 2021-02-19 DIAGNOSIS — I10 Essential (primary) hypertension: Secondary | ICD-10-CM

## 2021-02-19 DIAGNOSIS — E1142 Type 2 diabetes mellitus with diabetic polyneuropathy: Secondary | ICD-10-CM

## 2021-02-19 DIAGNOSIS — I25118 Atherosclerotic heart disease of native coronary artery with other forms of angina pectoris: Secondary | ICD-10-CM

## 2021-02-19 MED ORDER — POLYETHYLENE GLYCOL 3350 17 GM/SCOOP PO POWD: 17.0000 g | Freq: Two times a day (BID) | ORAL | 1 refills | Status: AC | PRN

## 2021-02-19 NOTE — Chronic Care Management (AMB) (Signed)
Chronic Care Management CCM Pharmacy Note  02/19/2021 Name:  Devin Hanna MRN:  [id number] DOB:  12-May-1950  Summary: - Back pain s/p fall after ICD shock. Acute visit yesterday. Results awaiting review - Working on patient assistance   Recommendations/Changes made from today's visit: - Continue current regimen. Will collaborate with CPhT to continue to support medication access  Subjective: Devin Hanna is an 71 y.o. year old male who is a primary patient of [name], Angela Adam, MD.  The CCM team was consulted for assistance with disease management and care coordination needs.    Engaged with patient's daughter, Devin Hanna  for follow up visit for pharmacy case management and/or care coordination services.   Objective:  Medications Reviewed Today     Reviewed by Earlyne Iba, CMA (Certified Medical Assistant) on 02/18/21 at 1315  Med List Status: <None>   Medication Order Taking? Sig Documenting Provider Last Dose Status Informant  atorvastatin (LIPITOR) 40 MG tablet [id number] Yes TAKE ONE TABLET BY MOUTH EVERY DAY Theora Gianotti, NP Taking Active   CINNAMON PO [id number] Yes Take 1,000 mg by mouth in the morning and at bedtime. [provider] Taking Active Child  clopidogrel (PLAVIX) 75 MG tablet [id number] Yes TAKE ONE TABLET EVERY MORNING WITH BREAKFAST [name], MD Taking Active   dapagliflozin propanediol ([name]) 10 MG TABS tablet [id number] Yes Take 1 tablet (10 mg total) by mouth daily before breakfast. [name], MD Taking Active   Dulaglutide ([name]) 3 BC/4.8GQ SOPN [id number] Yes Inject 3 mg as directed once a week. [name], MD Taking Active   ferrous sulfate 325 (65 FE) MG tablet [id number] Yes Take 162.5 mg by mouth daily with breakfast. [provider] Taking Active   furosemide (LASIX) 40 MG tablet [id number] Yes Take 1 tablet (40 mg total) by mouth daily. [name], MD Taking Active   gabapentin  (NEURONTIN) 300 MG capsule [id number] Yes Take 1 capsule (300 mg total) by mouth every morning AND 1 capsule (300 mg total) daily with lunch AND 2 capsules (600 mg total) at bedtime. [name], MD Taking Active   metoprolol succinate (TOPROL-XL) 50 MG 24 hr tablet [id number] Yes Take 1.5 tablets (75 mg total) by mouth in the morning and at bedtime. Take with or immediately following a meal. Vickie Epley, MD Taking Active   mexiletine (MEXITIL) 150 MG capsule [id number] Yes Take 1 capsule (150 mg total) by mouth 3 (three) times daily. Vickie Epley, MD Taking Active   Multiple Vitamin (MULTIVITAMIN WITH MINERALS) TABS [id number] Yes Take 1 tablet by mouth daily. Multivitamin for Men 50+ [provider] Taking Active Child  nitroGLYCERIN (NITROSTAT) 0.4 MG SL tablet [id number] Yes Place 1 tablet (0.4 mg total) under the tongue every 5 (five) minutes as needed for chest pain. [name], MD Taking Active            Med Note Tawanna Solo Oct 16, 2020  7:25 AM) Has medication, has never taken  pantoprazole (PROTONIX) 40 MG tablet [id number] Yes TAKE 1 TABLET BY MOUTH TWICE DAILY. [name], MD Taking Active   potassium chloride SA (KLOR-CON M) 20 MEQ tablet [id number] Yes Take 1 tablet (20 mEq total) by mouth daily. Vickie Epley, MD Taking Active   sacubitril-valsartan [facility]) 24-26 MG [id number] Yes Take 1 tablet by mouth 2 (two) times daily.  Patient taking differently: Take 0.5 tablets by mouth 2 (two) times  daily.   [name], MD Taking Active Self            Pertinent Labs:   Lab Results  Component Value Date   HGBA1C 6.4 10/04/2020   Lab Results  Component Value Date   CHOL 103 06/26/2020   HDL 34.50 (L) 06/26/2020   LDLCALC 37 06/26/2020   TRIG 159.0 (H) 06/26/2020   CHOLHDL 3 06/26/2020   Lab Results  Component Value Date   CREATININE 1.30 02/18/2021   BUN 17 02/18/2021   NA 141 02/18/2021   K 4.0 02/18/2021   CL  102 02/18/2021   CO2 29 02/18/2021   BNP    Component Value Date/Time   BNP 599.5 (H) 02/08/2021 1945    ProBNP    Component Value Date/Time   PROBNP 548.0 (H) 02/18/2021 1340     SDOH:  (Social Determinants of Health) assessments and interventions performed:  SDOH Interventions    Flowsheet Row Most Recent Value  SDOH Interventions   Financial Strain Interventions Other (Comment)  [manufacturer assistance]       CCM Care Plan  Review of patient past medical history, allergies, medications, health status, including review of consultants reports, laboratory and other test data, was performed as part of comprehensive evaluation and provision of chronic care management services.   Care Plan : Medication Management  Updates made by [name], RPH-CPP since 02/19/2021 12:00 AM     Problem: Diabetes, CHF, HTN      Long-Range Goal: Disease Progression Prevention   Start Date: 08/08/2020  Recent Progress: On track  Priority: High  Note:   Current Barriers:  Unable to independently afford treatment regimen Unable to achieve control of diabetes  Complex patient with multiple comorbidities including HFrEF, CAD, HLD  Pharmacist Clinical Goal(s):  Over the next 90 days, patient will verbalize ability to afford treatment regimen. Over the next 90 days, patient will achieve adherence to monitoring guidelines and medication adherence to achieve therapeutic efficacy. Over the next 90 days, patient will achieve control of diabetes as evidenced by improvement in A1c  Interventions: 1:1 collaboration with [name], MD regarding development and update of comprehensive plan of care as evidenced by provider attestation and co-signature Inter-disciplinary care team collaboration (see longitudinal plan of care) Comprehensive medication review performed; medication list updated in electronic medical record  Health Maintenance   Yearly diabetic eye exam: due -  recommended to schedule Yearly diabetic foot exam: up to date Urine microalbumin: up to date Yearly influenza vaccination: up to date  Td/Tdap vaccination: up to date Pneumonia vaccination: up to date COVID vaccinations: due - patient refuses Shingrix vaccinations: due - will discuss moving forward  Colonoscopy: up to date  Acute Needs: Seen by NP yesterday. Labs resulted, awaiting review.   Diabetes: Controlled; current treatment: [name] 3 mg weekly; [name] 10 mg daily Metformin d/c d/t renal function  Approved for patient assistance for [name], [name] through 02/08/22. Devin Hanna notes that patient received a 1 month supply of [name] yesterday, a 3 month supply of [place]. Aware of refill procedures.  Current glucose readings: fastings and 2 hour post prandials: 110-120s  Will collaborate w/ CPhT to follow up on [name] quantity.    Heart Failure, improved s/p CRT-D placement 03/2020; w/ Atrial Fibrillation  Appropriately managed; current treatment:  ARNI/ACEi/ARB: Entresto 24/26 mg - 1/2 tablet twice daily Beta blocker: carvedilol 12.5 mg twice daily SGLT2: [name] 10 mg daily Mineralocorticoid Receptor Antagonist: previously stopped d/t renal function, could be  considered moving forward pending BP Diuretic: furosemide 40 mg BID Arrhythmia suppression: mexiletine 150 mg TID Anticoagulation deferred d/t active liver malignancy, hx bleed  Previously discussed importance of weighing daily. Confirms understanding to contact cardiology/primary care team if weight gain >3 lbs in 1 day or >5 lbs in 1 week [employer] application determination has not arrived yet. Will collaborate w/ CPhT to follow for results.   Hyperlipidemia, secondary ASCVD prevention (hx MI w/ RCA stenting) Controlled per last lipid panel; current treatment: atorvastatin 40 mg daily Antiplatelet regimen: clopidogrel 75 mg daily Previously recommended to continue current regimen at this time.   GERD/hx  peptic ulcer disease: Appropriately managed and controlled per patient report; current treatment: pantoprazole 40 mg BID Previously recommended to continue current regimen at this time. PPI use appropriate given PUD.  Supplement: Cinnamon  Patient Goals/Self-Care Activities Over the next 90 days, patient will:  - take medications as prescribed check glucose 3-4 times weekly, document, and provide at future appointments check blood pressure twice daily, document, and provide at future appointments weigh daily, and contact provider if weight gain of greater than 3 lbs in a day or greater than 5 lbs in a week collaborate with provider on medication access solutions      Plan: Telephone follow up appointment with care management team member scheduled for:  4 months  [name], [name], [place], [place] Clinical Pharmacist [employer] at [employer] [phone]

## 2021-02-19 NOTE — Patient Instructions (Signed)
Visit Information  Following are the goals we discussed today:  Patient Goals/Self-Care Activities Over the next 90 days, patient will:  - take medications as prescribed check glucose 3-4 times weekly, document, and provide at future appointments check blood pressure twice daily, document, and provide at future appointments weigh daily, and contact provider if weight gain of greater than 3 lbs in a day or greater than 5 lbs in a week collaborate with provider on medication access solutions        Plan: Telephone follow up appointment with care management team member scheduled for:  4 months   [name], [name], [place], CPP Clinical Pharmacist [place] at [facility] [phone]   Please call the care guide team at [phone] if you need to cancel or reschedule your appointment.   Patient verbalizes understanding of instructions provided today and agrees to view in [place].

## 2021-02-19 NOTE — Progress Notes (Signed)
LVM for patient to call back for lab results or see results in mychart.  Cerena Baine,cma

## 2021-02-21 ENCOUNTER — Telehealth: Payer: Self-pay | Admitting: Pharmacy Technician

## 2021-02-21 DIAGNOSIS — Z596 Low income: Secondary | ICD-10-CM

## 2021-02-21 NOTE — Progress Notes (Signed)
[place] [facility])                                            [place] Team    02/21/2021  [place] [birth year] [phone]  Care coordination call placed to [employer] in regard to [facility] application.  Spoke to Shirlean Mylar who informs patient is re enrolled and APPROVED 02/09/21-02/08/22. Patient will need to call in for refills as he did in 2022 for delivery to his home.  Vayla Wilhelmi P. Gerardine Peltz, [place]  [phone]

## 2021-02-24 ENCOUNTER — Ambulatory Visit (HOSPITAL_COMMUNITY): Payer: PPO

## 2021-02-25 ENCOUNTER — Other Ambulatory Visit: Payer: Self-pay

## 2021-02-25 ENCOUNTER — Ambulatory Visit (INDEPENDENT_AMBULATORY_CARE_PROVIDER_SITE_OTHER): Payer: PPO

## 2021-02-25 ENCOUNTER — Encounter: Payer: Self-pay | Admitting: Adult Health

## 2021-02-25 ENCOUNTER — Ambulatory Visit (INDEPENDENT_AMBULATORY_CARE_PROVIDER_SITE_OTHER): Payer: PPO | Admitting: Adult Health

## 2021-02-25 VITALS — BP 120/74 | HR 70 | Ht 70.0 in | Wt 220.0 lb

## 2021-02-25 DIAGNOSIS — N390 Urinary tract infection, site not specified: Secondary | ICD-10-CM | POA: Diagnosis not present

## 2021-02-25 DIAGNOSIS — M62838 Other muscle spasm: Secondary | ICD-10-CM

## 2021-02-25 DIAGNOSIS — I5022 Chronic systolic (congestive) heart failure: Secondary | ICD-10-CM | POA: Diagnosis not present

## 2021-02-25 DIAGNOSIS — R109 Unspecified abdominal pain: Secondary | ICD-10-CM | POA: Diagnosis not present

## 2021-02-25 DIAGNOSIS — R059 Cough, unspecified: Secondary | ICD-10-CM | POA: Diagnosis not present

## 2021-02-25 DIAGNOSIS — Z9181 History of falling: Secondary | ICD-10-CM

## 2021-02-25 DIAGNOSIS — K5900 Constipation, unspecified: Secondary | ICD-10-CM

## 2021-02-25 DIAGNOSIS — M545 Low back pain, unspecified: Secondary | ICD-10-CM

## 2021-02-25 LAB — CBC WITH DIFFERENTIAL/PLATELET
Basophils Absolute: 0.1 10*3/uL (ref 0.0–0.1)
Basophils Relative: 0.7 % (ref 0.0–3.0)
Eosinophils Absolute: 0.1 10*3/uL (ref 0.0–0.7)
Eosinophils Relative: 0.9 % (ref 0.0–5.0)
HCT: 51.6 % (ref 39.0–52.0)
Hemoglobin: 16.9 g/dL (ref 13.0–17.0)
Lymphocytes Relative: 8.7 % — ABNORMAL LOW (ref 12.0–46.0)
Lymphs Abs: 0.7 10*3/uL (ref 0.7–4.0)
MCHC: 32.7 g/dL (ref 30.0–36.0)
MCV: 99.1 fl (ref 78.0–100.0)
Monocytes Absolute: 0.7 10*3/uL (ref 0.1–1.0)
Monocytes Relative: 8.9 % (ref 3.0–12.0)
Neutro Abs: 6.8 10*3/uL (ref 1.4–7.7)
Neutrophils Relative %: 80.8 % — ABNORMAL HIGH (ref 43.0–77.0)
Platelets: 275 10*3/uL (ref 150.0–400.0)
RBC: 5.21 Mil/uL (ref 4.22–5.81)
RDW: 14.9 % (ref 11.5–15.5)
WBC: 8.4 10*3/uL (ref 4.0–10.5)

## 2021-02-25 LAB — URINALYSIS, ROUTINE W REFLEX MICROSCOPIC
Bilirubin Urine: NEGATIVE
Ketones, ur: NEGATIVE
Nitrite: NEGATIVE
Specific Gravity, Urine: 1.01 (ref 1.000–1.030)
Total Protein, Urine: NEGATIVE
Urine Glucose: >=1000 — AB
Urobilinogen, UA: >=8 — AB (ref 0.0–1.0)
pH: 7.5 (ref 5.0–8.0)

## 2021-02-25 LAB — COMPREHENSIVE METABOLIC PANEL
ALT: 48 U/L (ref 0–53)
AST: 56 U/L — ABNORMAL HIGH (ref 0–37)
Albumin: 3.4 g/dL — ABNORMAL LOW (ref 3.5–5.2)
Alkaline Phosphatase: 378 U/L — ABNORMAL HIGH (ref 39–117)
BUN: 17 mg/dL (ref 6–23)
CO2: 29 mEq/L (ref 19–32)
Calcium: 9.3 mg/dL (ref 8.4–10.5)
Chloride: 98 mEq/L (ref 96–112)
Creatinine, Ser: 1.4 mg/dL (ref 0.40–1.50)
GFR: 50.07 mL/min — ABNORMAL LOW (ref >60.00)
Glucose, Bld: 107 mg/dL — ABNORMAL HIGH (ref 70–99)
Potassium: 5.3 mEq/L — ABNORMAL HIGH (ref 3.5–5.1)
Sodium: 138 mEq/L (ref 135–145)
Total Bilirubin: 1.6 mg/dL — ABNORMAL HIGH (ref 0.2–1.2)
Total Protein: 7 g/dL (ref 6.0–8.3)

## 2021-02-25 LAB — MAGNESIUM: Magnesium: 2.4 mg/dL (ref 1.5–2.5)

## 2021-02-25 LAB — BRAIN NATRIURETIC PEPTIDE: Pro B Natriuretic peptide (BNP): 317 pg/mL — ABNORMAL HIGH (ref 0.0–100.0)

## 2021-02-25 LAB — PSA: PSA: 0.99 ng/mL (ref 0.10–4.00)

## 2021-02-25 MED ORDER — CYCLOBENZAPRINE HCL 5 MG PO TABS: 5.0000 mg | ORAL_TABLET | Freq: Two times a day (BID) | ORAL | 0 refills | Status: AC | PRN

## 2021-02-25 NOTE — Assessment & Plan Note (Signed)
resulting in lumbar back pain.

## 2021-02-25 NOTE — Progress Notes (Signed)
Acute Office Visit  Subjective:    Patient ID: Devin Hanna, male    DOB: [birth year], 71 y.o.   MRN: [id number]  Chief Complaint  Patient presents with   Back Pain    HPI Patient is in today for follow up on back pain lower back midline. See below.  He feels bowels normal now since taking Miralax. Having bowel movement everyday. Denies any blood. Do look dark brown.denies  any black or tarry color.  Was seen last by Dutch Quint FNP pm 02/19/20 with c/o fatigue and weakness x 9 days. Constipation x 1 week and a cough that was reported to be recently improved.  Saw cardiology recently on January 4 after his biventricular ICD shocked him. He reports the shock through him on the ground and he has had back pain since that time intermittent.  Had BNP at last visit and was doubled on Lasix.  Reports cough is resolved, denies edema.   Has been refer to [facility] interventional radiology for hepatic possible hepatic carcinoma per MRI. Referred to gastroenterology as well.   02/19/2020 EXAM: ABDOMEN - 1 VIEW COMPARISON:  MRI abdomen 01/06/2021. FINDINGS: The bowel gas pattern is normal. Gallstones are again seen in the right upper quadrant. There is moderate stool burden. There are vascular calcifications in the pelvis. ICD leads are partially visualized. No acute fractures are seen. IMPRESSION: 1. Nonobstructive bowel gas pattern.  Moderate stool burden. 2. Cholelithiasis.   Electronically Signed   By: Ronney Asters M.D.   On: 02/18/2021 23:17    Chest x ray 12/31 FINDINGS: There is a new ICD present. The heart is mildly enlarged. There is central pulmonary vascular congestion. There is no lung consolidation, pleural effusion or pneumothorax. There is minimal scarring in the right costophrenic angle. No acute fractures are identified. Degenerative changes affect the thoracic spine.   IMPRESSION: 1. New ICD present. 2. Mild cardiomegaly with central pulmonary vascular  congestion.     Electronically Signed   By: Ronney Asters M.D.   On: 02/08/2021 20:18     Result History  DG Chest 2 View (Order #[id number]) on 02/08/2021 - Order Result History Report MyChart Results Release  MyChart Status: Active  Results Release   Encounter-Level Documents on 02/08/2021:  Document on 02/13/2021 12:07 AM by Aviva Kluver, RN: ED PB Billing Extract Document on 02/08/2021 11:36 PM by Rayna Sexton, PA-C: ED After Visit Summary Electronic signature on 02/08/2021 7:48 PM - 1 of 3 e-signatures recorded Electronic signature on 02/08/2021 7:34 PM    Patient  denies any fever, chills, rash, chest pain, shortness of breath, nausea, vomiting, or diarrhea.    Denies dizziness, lighttheadedness, pre syncopal or syncopal episodes.   Past Medical History:  Diagnosis Date   Acute respiratory failure with hypoxia ([place]) 06/2016   CAD (coronary artery disease)    a. Remote PCI of the RCA;  b. 03/2013 Inf STEMI/PCI: LAD 60-70d, RCA 99p ISR(rota/CBA/DES);  c. 09/2014 MV: inf and infsept infarct, EF 35-40%;  d. 09/2014 Cath: patent RCA stent, stable LAD dzs, EF 40%-->Med rx.   CHF (congestive heart failure) ([place])    Diabetes mellitus    sees Dr. Hardin Negus in [place]   GERD (gastroesophageal reflux disease)    HFrEF (heart failure with reduced ejection fraction) ([place])    Hyperlipidemia    Hypertension    Ischemic cardiomyopathy    a. 09/2014 EF 40% by V gram;  b. 06/2016 Echo: EF 40, diff HK, Gr1 DD,  inf HK, no mural thrombus. Mild MR, mildly dil LA, mildly dil RV.    LBBB (left bundle branch block)    Old   Liver mass    Morbid obesity ([place])    Peripheral neuropathy    Upper GI bleed    Vertigo     Past Surgical History:  Procedure Laterality Date   BIV ICD INSERTION CRT-D N/A 04/08/2020   Procedure: BIV ICD INSERTION CRT-D;  Surgeon: Vickie Epley, MD;  Location: [place] [facility];  Service: Cardiovascular;  Laterality: N/A;   CARDIAC CATHETERIZATION  N/A 10/03/2014   Procedure: Left Heart Cath and Coronary Angiography;  Surgeon: [name], MD;  Location: [place] [facility];  Service: Cardiovascular;  Laterality: N/A;   CIRCUMCISION N/A 07/22/2015   Procedure: CIRCUMCISION ADULT;  Surgeon: Hollice Espy, MD;  Location: [facility] ORS;  Service: Urology;  Laterality: N/A;   COLONOSCOPY WITH PROPOFOL N/A 01/12/2019   Procedure: COLONOSCOPY WITH PROPOFOL;  Surgeon: Lucilla Lame, MD;  Location: [facility] ENDOSCOPY;  Service: Endoscopy;  Laterality: N/A;   CORONARY STENT PLACEMENT     ESOPHAGOGASTRODUODENOSCOPY (EGD) WITH PROPOFOL N/A 09/12/2014   Procedure: ESOPHAGOGASTRODUODENOSCOPY (EGD) WITH PROPOFOL;  Surgeon: Ronald Lobo, MD;  Location: [facility] ENDOSCOPY;  Service: Endoscopy;  Laterality: N/A;   EXTERNAL FIXATION LEG Left 04/03/2012   Procedure: EXTERNAL FIXATION LEG;  Surgeon: Wylene Simmer, MD;  Location: [place];  Service: Orthopedics;  Laterality: Left;   EXTERNAL FIXATION REMOVAL Left 04/14/2012   Procedure: REMOVAL EXTERNAL FIXATION LEG;  Surgeon: Wylene Simmer, MD;  Location: [place];  Service: Orthopedics;  Laterality: Left;   IR 3D INDEPENDENT WKST  10/16/2020   IR 3D INDEPENDENT WKST  10/30/2020   IR ANGIOGRAM SELECTIVE EACH ADDITIONAL VESSEL  10/16/2020   IR ANGIOGRAM SELECTIVE EACH ADDITIONAL VESSEL  10/30/2020   IR ANGIOGRAM VISCERAL SELECTIVE  10/16/2020   IR ANGIOGRAM VISCERAL SELECTIVE  10/30/2020   IR EMBO ARTERIAL NOT HEMORR HEMANG INC GUIDE ROADMAPPING  10/16/2020   IR EMBO TUMOR ORGAN ISCHEMIA INFARCT INC GUIDE ROADMAPPING  10/30/2020   IR RADIOLOGIST EVAL & MGMT  09/06/2020   IR RADIOLOGIST EVAL & MGMT  11/27/2020   IR RADIOLOGIST EVAL & MGMT  01/23/2021   IR US GUIDE VASC ACCESS RIGHT  10/16/2020   IR US GUIDE VASC ACCESS RIGHT  10/30/2020   IRRIGATION AND DEBRIDEMENT KNEE Left 04/03/2012   Procedure: IRRIGATION AND DEBRIDEMENT KNEE;  Surgeon: Wylene Simmer, MD;  Location: [place];  Service: Orthopedics;  Laterality: Left;   LEFT HEART CATHETERIZATION  WITH CORONARY ANGIOGRAM N/A 04/05/2013   Procedure: LEFT HEART CATHETERIZATION WITH CORONARY ANGIOGRAM;  Surgeon: [name], MD;  Location: [place] CATH LAB;  Service: Cardiovascular;  Laterality: N/A;   MASS EXCISION N/A 07/22/2015   Procedure: EXCISION PENILE MASS;  Surgeon: Hollice Espy, MD;  Location: [facility] ORS;  Service: Urology;  Laterality: N/A;   ORIF TIBIA PLATEAU Left 04/14/2012   Procedure: OPEN REDUCTION INTERNAL FIXATION (ORIF) TIBIAL PLATEAU;  Surgeon: Wylene Simmer, MD;  Location: [place];  Service: Orthopedics;  Laterality: Left;   PERCUTANEOUS CORONARY ROTOBLATOR INTERVENTION (PCI-R) N/A 04/06/2013   Procedure: PERCUTANEOUS CORONARY ROTOBLATOR INTERVENTION (PCI-R);  Surgeon: [name], MD;  Location: [facility] CATH LAB;  Service: Cardiovascular;  Laterality: N/A;   RIGHT/LEFT HEART CATH AND CORONARY ANGIOGRAPHY N/A 08/21/2019   Procedure: RIGHT/LEFT HEART CATH AND CORONARY ANGIOGRAPHY;  Surgeon: [name], MD;  Location: [place] [facility];  Service: Cardiovascular;  Laterality: N/A;  Family History  Problem Relation Age of Onset   Coronary artery disease Father    Coronary artery disease Brother    Lung cancer Brother    Breast cancer Sister    Prostate cancer Brother    Stroke Sister     Social History   Socioeconomic History   Marital status: Divorced    Spouse name: Not on file   Number of children: Not on file   Years of education: Not on file   Highest education level: Not on file  Occupational History   Occupation: heavy Company secretary    Employer: [employer]  Tobacco Use   Smoking status: Former    Packs/day: 3.00    Years: 40.00    Pack years: 120.00    Types: Cigarettes    Quit date: 04/05/1995    Years since quitting: 25.9   Smokeless tobacco: Former   Tobacco comments:    started smoking at age 59  Vaping Use   Vaping Use: Never used  Substance and Sexual Activity   Alcohol use: No    Comment: Occassional Use   Drug use: No    Sexual activity: Not on file  Other Topics Concern   Not on file  Social History Narrative   Heavy Company secretary.; Divorced; quit smoking 25 years; beer rare; by self. Still riding motor cycles.    Social Determinants of Health   Financial Resource Strain: Medium Risk   Difficulty of Paying Living Expenses: Somewhat hard  Food Insecurity: No Food Insecurity   Worried About Charity fundraiser in the Last Year: Never true   Ran Out of Food in the Last Year: Never true  Transportation Needs: No Transportation Needs   Lack of Transportation (Medical): No   Lack of Transportation (Non-Medical): No  Physical Activity: Not on file  Stress: No Stress Concern Present   Feeling of Stress : Not at all  Social Connections: Unknown   Frequency of Communication with Friends and Family: More than three times a week   Frequency of Social Gatherings with Friends and Family: Not on file   Attends Religious Services: Not on file   Active Member of Clubs or Organizations: Not on file   Attends Archivist Meetings: Not on file   Marital Status: Not on file  Intimate Partner Violence: Not At Risk   Fear of Current or Ex-Partner: No   Emotionally Abused: No   Physically Abused: No   Sexually Abused: No    Outpatient Medications Prior to Visit  Medication Sig Dispense Refill   atorvastatin (LIPITOR) 40 MG tablet TAKE ONE TABLET BY MOUTH EVERY DAY 90 tablet 0   CINNAMON PO Take 1,000 mg by mouth in the morning and at bedtime.     clopidogrel (PLAVIX) 75 MG tablet TAKE ONE TABLET EVERY MORNING WITH BREAKFAST 90 tablet 3   dapagliflozin propanediol ([name]) 10 MG TABS tablet Take 1 tablet (10 mg total) by mouth daily before breakfast. 90 tablet 3   Dulaglutide ([name]) 3 DD/2.2GU SOPN Inject 3 mg as directed once a week. 6 mL 1   ferrous sulfate 325 (65 FE) MG tablet Take 162.5 mg by mouth daily with breakfast.     furosemide (LASIX) 40 MG tablet Take 1 tablet (40 mg total) by mouth  daily. 90 tablet 1   gabapentin (NEURONTIN) 300 MG capsule Take 1 capsule (300 mg total) by mouth every morning AND 1 capsule (300 mg total) daily with lunch AND 2  capsules (600 mg total) at bedtime. 360 capsule 3   metoprolol succinate (TOPROL-XL) 50 MG 24 hr tablet Take 1.5 tablets (75 mg total) by mouth in the morning and at bedtime. Take with or immediately following a meal. 270 tablet 3   mexiletine (MEXITIL) 150 MG capsule Take 1 capsule (150 mg total) by mouth 3 (three) times daily. 270 capsule 3   Multiple Vitamin (MULTIVITAMIN WITH MINERALS) TABS Take 1 tablet by mouth daily. Multivitamin for Men 50+     nitroGLYCERIN (NITROSTAT) 0.4 MG SL tablet Place 1 tablet (0.4 mg total) under the tongue every 5 (five) minutes as needed for chest pain. 25 tablet 0   pantoprazole (PROTONIX) 40 MG tablet TAKE 1 TABLET BY MOUTH TWICE DAILY. 180 tablet 3   polyethylene glycol powder (GLYCOLAX/MIRALAX) 17 GM/SCOOP powder Take 17 g by mouth 2 (two) times daily as needed. 3350 g 1   potassium chloride SA (KLOR-CON M) 20 MEQ tablet Take 1 tablet (20 mEq total) by mouth daily. 90 tablet 3   sacubitril-valsartan (ENTRESTO) 24-26 MG Take 1 tablet by mouth 2 (two) times daily. (Patient taking differently: Take 0.5 tablets by mouth 2 (two) times daily.)     No facility-administered medications prior to visit.    No Known Allergies  Review of Systems  Constitutional: Negative.   HENT: Negative.    Eyes: Negative.   Respiratory: Negative.    Cardiovascular: Negative.   Gastrointestinal:  Positive for constipation. Negative for abdominal distention, abdominal pain, anal bleeding, blood in stool, diarrhea, nausea, rectal pain and vomiting.  Genitourinary: Negative.   Musculoskeletal:  Positive for arthralgias, back pain and myalgias. Negative for gait problem, joint swelling, neck pain and neck stiffness.  Neurological: Negative.   Hematological: Negative.   Psychiatric/Behavioral: Negative.         Objective:    Physical Exam Constitutional:      General: He is not in acute distress.    Appearance: Normal appearance. He is not ill-appearing, toxic-appearing or diaphoretic.  Abdominal:     General: There is no distension.     Palpations: Abdomen is soft. There is no mass.     Tenderness: There is no abdominal tenderness. There is no right CVA tenderness, left CVA tenderness, guarding or rebound.     Hernia: No hernia is present.  Musculoskeletal:     Cervical back: Normal.     Thoracic back: Normal.     Lumbar back: Spasms and tenderness present.       Back:  Skin:    General: Skin is warm.     Findings: No erythema or rash.  Neurological:     Mental Status: He is oriented to person, place, and time.     Motor: No weakness.     Gait: Gait normal.  Psychiatric:        Mood and Affect: Mood normal.        Behavior: Behavior normal.        Thought Content: Thought content normal.        Judgment: Judgment normal.      General: Appearance:    Obese male in no acute distress  Eyes:    PERRL, conjunctiva/corneas clear, EOM's intact       Lungs:     Clear to auscultation bilaterally, respirations unlabored  Heart:    Normal heart rate. Regular rhythm. No murmurs, rubs, or gallops.   MS:   All extremities are intact.    Neurologic:   Awake, alert,  oriented x 3. No apparent focal neurological           defect.     BP 120/74    Pulse 70    Ht 5' 10" (1.778 m)    Wt 220 lb (99.8 kg)    SpO2 96%    BMI 31.57 kg/m  Wt Readings from Last 3 Encounters:  02/25/21 220 lb (99.8 kg)  02/18/21 226 lb 12.8 oz (102.9 kg)  02/12/21 226 lb 3.2 oz (102.6 kg)    Health Maintenance Due  Topic Date Due   Zoster Vaccines- Shingrix (1 of 2) Never done   OPHTHALMOLOGY EXAM  05/27/2018    There are no preventive care reminders to display for this patient.   Lab Results  Component Value Date   TSH 3.420 07/05/2020   Lab Results  Component Value Date   WBC 8.4 02/25/2021   HGB  16.9 02/25/2021   HCT 51.6 02/25/2021   MCV 99.1 02/25/2021   PLT 275.0 02/25/2021   Lab Results  Component Value Date   NA 138 02/25/2021   K 5.3 (H) 02/25/2021   CO2 29 02/25/2021   GLUCOSE 107 (H) 02/25/2021   BUN 17 02/25/2021   CREATININE 1.40 02/25/2021   BILITOT 1.6 (H) 02/25/2021   ALKPHOS 378 (H) 02/25/2021   AST 56 (H) 02/25/2021   ALT 48 02/25/2021   PROT 7.0 02/25/2021   ALBUMIN 3.4 (L) 02/25/2021   CALCIUM 9.3 02/25/2021   ANIONGAP 9 02/08/2021   EGFR 57 (L) 02/12/2021   GFR 50.07 (L) 02/25/2021   Lab Results  Component Value Date   CHOL 103 06/26/2020   Lab Results  Component Value Date   HDL 34.50 (L) 06/26/2020   Lab Results  Component Value Date   LDLCALC 37 06/26/2020   Lab Results  Component Value Date   TRIG 159.0 (H) 06/26/2020   Lab Results  Component Value Date   CHOLHDL 3 06/26/2020   Lab Results  Component Value Date   HGBA1C 6.4 10/04/2020       Assessment & Plan:   Problem List Items Addressed This Visit       Cardiovascular and Mediastinum   Chronic systolic heart failure ([place])    Doing well his lasix was increased by cardiology, he reports he is back at his usual dose. He has no leg edema today, does not endorse shortness of breath. Will recheck BNP today.         Other   Cough    Cough resolved per patient      Acute midline low back pain without sciatica - Primary    Continues to have back pain lower after fall from ICD shock, he will have lumbar x ray today.  Low dose flexeril given 66m po BID PRN muscle spasm and side effects caution.  Follow treatment plan from office  if not improving or any worsening within 72 hours and also return to office or open medical facility at ANYTIME if any symptoms persist, change, or worsen or you have any further concerns or questions. Call 911 immediately for emergencies.         Relevant Medications   cyclobenzaprine (FLEXERIL) 5 MG tablet   Other Relevant Orders   DG Chest  2 View (Completed)   DG Lumbar Spine Complete (Completed)   CBC with Differential/Platelet (Completed)   B Nat Peptide (Completed)   Comprehensive metabolic panel (Completed)   Magnesium (Completed)   PSA (Completed)   Urinalysis  Urine Culture   Constipation    Constipation improving will repeat Abdominal x ray to see if cleared.he is on Miralax had two normal bowel movements today 02/25/21      Relevant Orders   DG Abd 1 View (Completed)   Muscle spasm   Relevant Medications   cyclobenzaprine (FLEXERIL) 5 MG tablet   History of fall    resulting in lumbar back pain.       Keep follow up with gastroenterology and radiation oncology for liver lesion/ carcinoma.   Meds ordered this encounter  Medications   cyclobenzaprine (FLEXERIL) 5 MG tablet    Sig: Take 1 tablet (5 mg total) by mouth 2 (two) times daily as needed for muscle spasms (will scause drowsiness take caution.).    Dispense:  30 tablet    Refill:  0   Orders Placed This Encounter  Procedures   Urine Culture   DG Chest 2 View    Order Specific Question:   Reason for Exam (SYMPTOM  OR DIAGNOSIS REQUIRED)    Answer:   cough    Order Specific Question:   Preferred imaging location?    Answer:   [facility]   DG Lumbar Spine Complete    Order Specific Question:   Reason for Exam (SYMPTOM  OR DIAGNOSIS REQUIRED)    Answer:   back pain    Order Specific Question:   Preferred imaging location?    Answer:   [facility]   DG Abd 1 View    Order Specific Question:   Reason for Exam (SYMPTOM  OR DIAGNOSIS REQUIRED)    Answer:   follow up on constipation    Order Specific Question:   Preferred imaging location?    Answer:   [facility]    Order Specific Question:   Radiology Contrast Protocol - do NOT remove file path    Answer:   [email or web address]   CBC with Differential/Platelet   B Nat Peptide   Comprehensive metabolic panel   Magnesium    PSA   Urinalysis   Urinalysis, Routine w reflex microscopic    Keep cardiology follow up.  Lumbar and chest x ray today will also repeat abdominal x ray to be sure constipation is clearing.  Follow treatment plan from office  if not improving or any worsening within 72 hours and also return to office or open medical facility at ANYTIME if any symptoms persist, change, or worsen or you have any further concerns or questions. Call 911 immediately for emergencies.   Return in about 1 week (around 03/04/2021), or if symptoms worsen or fail to improve, for at any time for any worsening symptoms, Go to Emergency room/ urgent care if worse.   Marcille Buffy, FNP

## 2021-02-25 NOTE — Patient Instructions (Signed)
Constipation, Adult Constipation is when a person has trouble pooping (having a bowel movement). When you have this condition, you may poop fewer than 3 times a week. Your poop (stool) may also be dry, hard, or bigger than normal. Follow these instructions at home: Eating and drinking  Eat foods that have a lot of fiber, such as: Fresh fruits and vegetables. Whole grains. Beans. Eat less of foods that are low in fiber and high in fat and sugar, such as: [place] fries. Hamburgers. Cookies. Candy. Soda. Drink enough fluid to keep your pee (urine) pale yellow. General instructions Exercise regularly or as told by your doctor. Try to do 150 minutes of exercise each week. Go to the restroom when you feel like you need to poop. Do not hold it in. Take over-the-counter and prescription medicines only as told by your doctor. These include any fiber supplements. When you poop: Do deep breathing while relaxing your lower belly (abdomen). Relax your pelvic floor. The pelvic floor is a group of muscles that support the rectum, bladder, and intestines (as well as the uterus in women). Watch your condition for any changes. Tell your doctor if you notice any. Keep all follow-up visits as told by your doctor. This is important. Contact a doctor if: You have pain that gets worse. You have a fever. You have not pooped for 4 days. You vomit. You are not hungry. You lose weight. You are bleeding from the opening of the butt (anus). You have thin, pencil-like poop. Get help right away if: You have a fever, and your symptoms suddenly get worse. You leak poop or have blood in your poop. Your belly feels hard or bigger than normal (bloated). You have very bad belly pain. You feel dizzy or you faint. Summary Constipation is when a person poops fewer than 3 times a week, has trouble pooping, or has poop that is dry, hard, or bigger than normal. Eat foods that have a lot of fiber. Drink enough fluid  to keep your pee (urine) pale yellow. Take over-the-counter and prescription medicines only as told by your doctor. These include any fiber supplements. This information is not intended to replace advice given to you by your health care provider. Make sure you discuss any questions you have with your health care provider. Document Revised: 12/14/2018 Document Reviewed: 12/14/2018 Elsevier Patient Education  [place]. Acute Back Pain, Adult Acute back pain is sudden and usually short-lived. It is often caused by an injury to the muscles and tissues in the back. The injury may result from: A muscle, tendon, or ligament getting overstretched or torn. Ligaments are tissues that connect bones to each other. Lifting something improperly can cause a back strain. Wear and tear (degeneration) of the spinal disks. Spinal disks are circular tissue that provide cushioning between the bones of the spine (vertebrae). Twisting motions, such as while playing sports or doing yard work. A hit to the back. Arthritis. You may have a physical exam, lab tests, and imaging tests to find the cause of your pain. Acute back pain usually goes away with rest and home care. Follow these instructions at home: Managing pain, stiffness, and swelling Take over-the-counter and prescription medicines only as told by your health care provider. Treatment may include medicines for pain and inflammation that are taken by mouth or applied to the skin, or muscle relaxants. Your health care provider may recommend applying ice during the first 24-48 hours after your pain starts. To do this: Put  ice in a plastic bag. Place a towel between your skin and the bag. Leave the ice on for 20 minutes, 2-3 times a day. Remove the ice if your skin turns bright red. This is very important. If you cannot feel pain, heat, or cold, you have a greater risk of damage to the area. If directed, apply heat to the affected area as often as told by  your health care provider. Use the heat source that your health care provider recommends, such as a moist heat pack or a heating pad. Place a towel between your skin and the heat source. Leave the heat on for 20-30 minutes. Remove the heat if your skin turns bright red. This is especially important if you are unable to feel pain, heat, or cold. You have a greater risk of getting burned. Activity  Do not stay in bed. Staying in bed for more than 1-2 days can delay your recovery. Sit up and stand up straight. Avoid leaning forward when you sit or hunching over when you stand. If you work at a desk, sit close to it so you do not need to lean over. Keep your chin tucked in. Keep your neck drawn back, and keep your elbows bent at a 90-degree angle (right angle). Sit high and close to the steering wheel when you drive. Add lower back (lumbar) support to your car seat, if needed. Take short walks on even surfaces as soon as you are able. Try to increase the length of time you walk each day. Do not sit, drive, or stand in one place for more than 30 minutes at a time. Sitting or standing for long periods of time can put stress on your back. Do not drive or use heavy machinery while taking prescription pain medicine. Use proper lifting techniques. When you bend and lift, use positions that put less stress on your back: [place] your knees. Keep the load close to your body. Avoid twisting. Exercise regularly as told by your health care provider. Exercising helps your back heal faster and helps prevent back injuries by keeping muscles strong and flexible. Work with a physical therapist to make a safe exercise program, as recommended by your health care provider. Do any exercises as told by your physical therapist. Lifestyle Maintain a healthy weight. Extra weight puts stress on your back and makes it difficult to have good posture. Avoid activities or situations that make you feel anxious or stressed. Stress  and anxiety increase muscle tension and can make back pain worse. Learn ways to manage anxiety and stress, such as through exercise. General instructions Sleep on a firm mattress in a comfortable position. Try lying on your side with your knees slightly bent. If you lie on your back, put a pillow under your knees. Keep your head and neck in a straight line with your spine (neutral position) when using electronic equipment like smartphones or pads. To do this: Raise your smartphone or pad to look at it instead of bending your head or neck to look down. Put the smartphone or pad at the level of your face while looking at the screen. Follow your treatment plan as told by your health care provider. This may include: Cognitive or behavioral therapy. Acupuncture or massage therapy. Meditation or yoga. Contact a health care provider if: You have pain that is not relieved with rest or medicine. You have increasing pain going down into your legs or buttocks. Your pain does not improve after 2 weeks. You have  pain at night. You lose weight without trying. You have a fever or chills. You develop nausea or vomiting. You develop abdominal pain. Get help right away if: You develop new bowel or bladder control problems. You have unusual weakness or numbness in your arms or legs. You feel faint. These symptoms may represent a serious problem that is an emergency. Do not wait to see if the symptoms will go away. Get medical help right away. Call your local emergency services (911 in the [place] Do not drive yourself to the hospital. Summary Acute back pain is sudden and usually short-lived. Use proper lifting techniques. When you bend and lift, use positions that put less stress on your back. Take over-the-counter and prescription medicines only as told by your health care provider, and apply heat or ice as told. This information is not intended to replace advice given to you by your health care provider.  Make sure you discuss any questions you have with your health care provider. Document Revised: 04/19/2020 Document Reviewed: 04/19/2020 Elsevier Patient Education  2022 [place]. Lumbosacral Strain Lumbosacral strain is an injury that causes pain in the lower back (lumbosacral spine). This injury usually happens from overstretching the muscles or ligaments along your spine. Ligaments are cord-like tissues that connect bones to other bones. A strain can affect one or more muscles or ligaments. What are the causes? This condition may be caused by: A hard, direct hit to the back. Overstretching the lower back muscles. This may result from: A fall. Lifting something heavy. Repetitive movements such as bending or crouching. What increases the risk? The following factors may make you more likely to develop this condition: Participating in sports or activities that involve: A sudden twist of the back. Pushing or pulling motions. Being overweight or obese. Having poor strength and flexibility, especially tight hamstrings or weak muscles in the back or abdomen. Having too much of a curve in the lower back. Having a pelvis that is tilted forward. What are the signs or symptoms? The main symptom of this condition is pain in the lower back, at the site of the strain. Pain may also be felt down one or both legs. How is this diagnosed? This condition is diagnosed based on your symptoms, your medical history, and a physical exam. During the physical exam, your health care provider may push on certain areas of your back to find the source of your pain. You may be asked to bend forward, backward, and side to side to check your pain and range of motion. You may also have imaging tests, such as X-rays and an MRI. How is this treated? This condition may be treated by: Applying heat and cold on the affected area. Taking medicines to help relieve pain and relax your muscles. Taking NSAIDs, such as  ibuprofen, to help reduce swelling and discomfort. Doing stretching and strengthening exercises for your lower back. Symptoms usually improve within several weeks of treatment. However, recovery time varies. When your symptoms improve, gradually return to your normal routine as soon as possible to reduce pain, avoid stiffness, and keep muscle strength. Follow these instructions at home: Medicines Take over-the-counter and prescription medicines only as told by your health care provider. Ask your health care provider if the medicine prescribed to you: Requires you to avoid driving or using heavy machinery. Can cause constipation. You may need to take these actions to prevent or treat constipation: Drink enough fluid to keep your urine pale yellow. Take over-the-counter or prescription medicines.  Eat foods that are high in fiber, such as beans, whole grains, and fresh fruits and vegetables. Limit foods that are high in fat and processed sugars, such as fried or sweet foods. Managing pain, stiffness, and swelling   If directed, put ice on the injured area. To do this: Put ice in a plastic bag. Place a towel between your skin and the bag. Leave the ice on for 20 minutes, 2-3 times a day. If directed, apply heat on the affected area as often as told by your health care provider. Use the heat source that your health care provider recommends, such as a moist heat pack or a heating pad. Place a towel between your skin and the heat source. Leave the heat on for 20-30 minutes. Remove the heat if your skin turns bright red. This is especially important if you are unable to feel pain, heat, or cold. You may have a greater risk of getting burned. Activity Rest as told by your health care provider. Do not stay in bed. Staying in bed for more than 1-2 days can delay your recovery. Return to your normal activities as told by your health care provider. Ask your health care provider what activities are safe  for you. Avoid activities that take a lot of energy for as long as told by your health care provider. Do exercises as told by your health care provider. This includes stretching and strengthening exercises. General instructions Sit up and stand up straight. Avoid leaning forward when you sit, or hunching over when you stand. Do not use any products that contain nicotine or tobacco, such as cigarettes, e-cigarettes, and chewing tobacco. If you need help quitting, ask your health care provider. Keep all follow-up visits as told by your health care provider. This is important. How is this prevented?  Use correct form when playing sports and lifting heavy objects. Use good posture when sitting and standing. Maintain a healthy weight. Sleep on a mattress with medium firmness to support your back. Do at least 150 minutes of moderate-intensity exercise each week, such as brisk walking or water aerobics. Try a form of exercise that takes stress off your back, such as swimming or stationary cycling. Maintain physical fitness, including: Strength. Flexibility. Contact a health care provider if: Your back pain does not improve after several weeks of treatment. Your symptoms get worse. Get help right away if: Your back pain is severe. You cannot stand or walk. You have difficulty controlling when you urinate or when you have a bowel movement. You feel nauseous or you vomit. Your feet or legs get very cold, turn pale, or look blue. You have numbness, tingling, weakness, or problems using your arms or legs. You develop any of the following: Shortness of breath. Dizziness. Pain in your legs. Weakness in your buttocks or legs. Summary Lumbosacral strain is an injury that causes pain in the lower back (lumbosacral spine). This injury usually happens from overstretching the muscles or ligaments along your spine. This condition may be caused by a direct hit to the lower back or by overstretching the  lower back muscles. Symptoms usually improve within several weeks of treatment. This information is not intended to replace advice given to you by your health care provider. Make sure you discuss any questions you have with your health care provider. Document Revised: 06/21/2018 Document Reviewed: 06/21/2018 Elsevier Patient Education  [place]. Cyclobenzaprine Tablets What is this medication? CYCLOBENZAPRINE (sye kloe BEN za preen) treats muscle spasms. It works  by relaxing your muscles, which reduces muscle stiffness. It belongs to a group of medications called muscle relaxants. This medicine may be used for other purposes; ask your health care provider or pharmacist if you have questions. COMMON BRAND NAME(S): Fexmid, Flexeril What should I tell my care team before I take this medication? They need to know if you have any of these conditions: Heart disease, irregular heartbeat, or previous heart attack Liver disease Thyroid problem An unusual or allergic reaction to cyclobenzaprine, tricyclic antidepressants, lactose, other medications, foods, dyes, or preservatives Pregnant or trying to get pregnant Breast-feeding How should I use this medication? Take this medication by mouth with a glass of water. Follow the directions on the prescription label. If this medication upsets your stomach, take it with food or milk. Take your medication at regular intervals. Do not take it more often than directed. Talk to your care team about the use of this medication in children. Special care may be needed. Overdosage: If you think you have taken too much of this medicine contact a poison control center or emergency room at once. NOTE: This medicine is only for you. Do not share this medicine with others. What if I miss a dose? If you miss a dose, take it as soon as you can. If it is almost time for your next dose, take only that dose. Do not take double or extra doses. What may interact with  this medication? Do not take this medication with any of the following: MAOIs like Carbex, Eldepryl, Marplan, Nardil, and Parnate Narcotic medications for cough Safinamide This medication may also interact with the following: Alcohol Bupropion Antihistamines for allergy, cough and cold Certain medications for anxiety or sleep Certain medications for bladder problems like oxybutynin, tolterodine Certain medications for depression like amitriptyline, fluoxetine, sertraline Certain medications for Parkinson's disease like benztropine, trihexyphenidyl Certain medications for seizures like phenobarbital, primidone Certain medications for stomach problems like dicyclomine, hyoscyamine Certain medications for travel sickness like scopolamine General anesthetics like halothane, isoflurane, methoxyflurane, propofol Ipratropium Local anesthetics like lidocaine, pramoxine, tetracaine Medications that relax muscles for surgery Narcotic medications for pain Phenothiazines like chlorpromazine, mesoridazine, prochlorperazine, thioridazine Verapamil This list may not describe all possible interactions. Give your health care provider a list of all the medicines, herbs, non-prescription drugs, or dietary supplements you use. Also tell them if you smoke, drink alcohol, or use illegal drugs. Some items may interact with your medicine. What should I watch for while using this medication? Tell your care team if your symptoms do not start to get better or if they get worse. You may get drowsy or dizzy. Do not drive, use machinery, or do anything that needs mental alertness until you know how this medication affects you. Do not stand or sit up quickly, especially if you are an older patient. This reduces the risk of dizzy or fainting spells. Alcohol may interfere with the effect of this medication. Avoid alcoholic drinks. If you are taking another medication that also causes drowsiness, you may have more side  effects. Give your care team a list of all medications you use. Your care team will tell you how much medication to take. Do not take more medication than directed. Call emergency for help if you have problems breathing or unusual sleepiness. Your mouth may get dry. Chewing sugarless gum or sucking hard candy, and drinking plenty of water may help. Contact your care team if the problem does not go away or is severe. What side effects may I  notice from receiving this medication? Side effects that you should report to your care team as soon as possible: Allergic reactions--skin rash, itching, hives, swelling of the face, lips, tongue, or throat CNS depression--slow or shallow breathing, shortness of breath, feeling faint, dizziness, confusion, trouble staying awake Heart rhythm changes--fast or irregular heartbeat, dizziness, feeling faint or lightheaded, chest pain, trouble breathing Side effects that usually do not require medical attention (report to your care team if they continue or are bothersome): Constipation Dizziness Drowsiness Dry mouth Fatigue Nausea This list may not describe all possible side effects. Call your doctor for medical advice about side effects. You may report side effects to FDA at 1-800-FDA-1088. Where should I keep my medication? Keep out of the reach of children. Store at room temperature between 15 and 30 degrees C (59 and 86 degrees F). Keep container tightly closed. Throw away any unused medication after the expiration date. NOTE: This sheet is a summary. It may not cover all possible information. If you have questions about this medicine, talk to your doctor, pharmacist, or health care provider.  2022 Elsevier/Gold Standard (2020-05-09 00:00:00)

## 2021-02-25 NOTE — Assessment & Plan Note (Signed)
Doing well his lasix was increased by cardiology, he reports he is back at his usual dose. He has no leg edema today, does not endorse shortness of breath. Will recheck BNP today.

## 2021-02-25 NOTE — Assessment & Plan Note (Signed)
Cough resolved per patient

## 2021-02-25 NOTE — Assessment & Plan Note (Signed)
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  Low dose flexeril given 62m po BID PRN muscle spasm and side effects caution.  Follow treatment plan from office  if not improving or any worsening within 72 hours and also return to office or open medical facility at ANYTIME if any symptoms persist, change, or worsen or you have any further concerns or questions. Call 911 immediately for emergencies.

## 2021-02-25 NOTE — Assessment & Plan Note (Signed)
Constipation improving will repeat Abdominal x ray to see if cleared.he is on Miralax had two normal bowel movements today 02/25/21

## 2021-02-26 NOTE — Progress Notes (Signed)
Improved constipation, would recommend doing the Miralax for a few more days and then being sure to increase fiber in diet. Can take Colace 100 mg once daily stool softener for prevention of constipation.

## 2021-02-26 NOTE — Progress Notes (Signed)
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  Compression deformities of t12 and L1 new since last MRI per radiologist but uncertain age of injury. Pelvis is intact.  Will have him see orthopedics for further evaluation and work up and he should be sure to let them know regarding his pacemaker in place. Does he have a preference for an orthopedic ?

## 2021-02-26 NOTE — Progress Notes (Signed)
Devin Hanna  Let him know that x ray shows known pacer wires and stable cardiac enlargement. Keep appointment for follow up with cardiologist. No other acute process on x ray.    CC. Dr. Lars Mage - cardiology.

## 2021-02-27 ENCOUNTER — Telehealth: Payer: Self-pay | Admitting: Family Medicine

## 2021-02-27 ENCOUNTER — Ambulatory Visit: Payer: PPO | Admitting: Cardiovascular Disease

## 2021-02-27 NOTE — Telephone Encounter (Signed)
I called the patient's daughter and informed her that the xray has not resulted yet and I would call her when it does and she understood.  Devin Hanna,cma

## 2021-02-27 NOTE — Telephone Encounter (Signed)
Patient's daughter Devin Hanna called. She would like someone to call her. Patient and daughter can see xray results, but are not understanding results. Please call Devin Hanna.

## 2021-02-28 ENCOUNTER — Telehealth: Payer: Self-pay

## 2021-02-28 ENCOUNTER — Other Ambulatory Visit: Payer: Self-pay | Admitting: Adult Health

## 2021-02-28 DIAGNOSIS — K5900 Constipation, unspecified: Secondary | ICD-10-CM

## 2021-02-28 DIAGNOSIS — N3001 Acute cystitis with hematuria: Secondary | ICD-10-CM | POA: Insufficient documentation

## 2021-02-28 LAB — URINE CULTURE
MICRO NUMBER:: 12880376
SPECIMEN QUALITY:: ADEQUATE

## 2021-02-28 MED ORDER — CIPROFLOXACIN HCL 500 MG PO TABS: 500.0000 mg | ORAL_TABLET | Freq: Two times a day (BID) | ORAL | 0 refills | Status: DC

## 2021-02-28 NOTE — Progress Notes (Signed)
Magnesium and PSA within normal limits.  BNP is within normal limits, continue to follow cardiology instructions for lasix.  Urine is showing some bacteria and trace blood waiting on the urine culture will send antibiotic if needed based on culture.  CBC ok stable neutrophils and lymphocytes scantly off range. Repeat cbc in 3 weeks please add and schedule.  CMP with elevated alkaline phosphatase increased more, liver enzymes and bilirubin elevated, if not seeing gastroenterology already needs referral, for further work up- let me know and if preference if referral needed? Potassium is also mildly increased advise hold potassium supplement x 2 days and then restart, recheck CMP with CBC in 3 weeks please add and schedule.  See x ray results as well for this patient. Urine culture pending.

## 2021-02-28 NOTE — Progress Notes (Signed)
Urinary tract infection on urine culture. Cipro 500 mg sent to pharmacy to take one tablet twice a day by mouth as directed for 7 days. [facility] Repeat urine culture and urinalysis needed two weeks after completing antibiotics.   Follow treatment plan from office  if not improving or any worsening within 72 hours and also return to office or open medical facility at ANYTIME if any symptoms persist, change, or worsen or you have any further concerns or questions. Call 911 immediately for emergencies.

## 2021-02-28 NOTE — Progress Notes (Signed)
Meds ordered this encounter  Medications   ciprofloxacin (CIPRO) 500 MG tablet    Sig: Take 1 tablet (500 mg total) by mouth 2 (two) times daily.    Dispense:  14 tablet    Refill:  0    Needs repeat urine culture and  urinalysis in 2 weeks after the Cipro completed.  Follow treatment plan from office  if not improving or any worsening within 72 hours and also return to office or open medical facility at ANYTIME if any symptoms persist, change, or worsen or you have any further concerns or questions. Call 911 immediately for emergencies.

## 2021-02-28 NOTE — Telephone Encounter (Signed)
Placed orders for CBC and CMP for future labs

## 2021-03-03 NOTE — Addendum Note (Signed)
Addended by: Nanci Pina on: 03/03/2021 10:48 AM   Modules accepted: Orders

## 2021-03-07 ENCOUNTER — Ambulatory Visit: Payer: PPO | Admitting: Cardiovascular Disease

## 2021-03-07 ENCOUNTER — Other Ambulatory Visit: Payer: Self-pay

## 2021-03-07 ENCOUNTER — Encounter: Payer: Self-pay | Admitting: Cardiovascular Disease

## 2021-03-07 VITALS — BP 100/70 | HR 70 | Ht 70.0 in | Wt 219.5 lb

## 2021-03-07 DIAGNOSIS — I5022 Chronic systolic (congestive) heart failure: Secondary | ICD-10-CM | POA: Diagnosis not present

## 2021-03-07 DIAGNOSIS — E785 Hyperlipidemia, unspecified: Secondary | ICD-10-CM

## 2021-03-07 DIAGNOSIS — I10 Essential (primary) hypertension: Secondary | ICD-10-CM | POA: Diagnosis not present

## 2021-03-07 DIAGNOSIS — I4891 Unspecified atrial fibrillation: Secondary | ICD-10-CM | POA: Diagnosis not present

## 2021-03-07 DIAGNOSIS — E1142 Type 2 diabetes mellitus with diabetic polyneuropathy: Secondary | ICD-10-CM

## 2021-03-07 DIAGNOSIS — I2510 Atherosclerotic heart disease of native coronary artery without angina pectoris: Secondary | ICD-10-CM | POA: Diagnosis not present

## 2021-03-07 DIAGNOSIS — Z95810 Presence of automatic (implantable) cardiac defibrillator: Secondary | ICD-10-CM

## 2021-03-07 NOTE — Patient Instructions (Signed)
Medication Instructions:  Your physician recommends that you continue on your current medications as directed. Please refer to the Current Medication list given to you today.  *If you need a refill on your cardiac medications before your next appointment, please call your pharmacy*   Lab Work: None ordered If you have labs (blood work) drawn today and your tests are completely normal, you will receive your results only by: [place] (if you have MyChart) OR A paper copy in the mail If you have any lab test that is abnormal or we need to change your treatment, we will call you to review the results.   Testing/Procedures: None ordered   Follow-Up: At [facility], you and your health needs are our priority.  As part of our continuing mission to provide you with exceptional heart care, we have created designated Provider Care Teams.  These Care Teams include your primary Cardiologist (physician) and Advanced Practice Providers (APPs -  Physician Assistants and Nurse Practitioners) who all work together to provide you with the care you need, when you need it.  We recommend signing up for the patient portal called "MyChart".  Sign up information is provided on this After Visit Summary.  MyChart is used to connect with patients for Virtual Visits (Telemedicine).  Patients are able to view lab/test results, encounter notes, upcoming appointments, etc.  Non-urgent messages can be sent to your provider as well.   To learn more about what you can do with MyChart, go to [email or web address].    Your next appointment:   4 month(s)  The format for your next appointment:   In Person  Provider:   You may see [name], MD or one of the following Advanced Practice Providers on your designated Care Team:   Murray Hodgkins, NP Christell Faith, PA-C Cadence Kathlen Mody, PA-C{     Other Instructions N/A

## 2021-03-07 NOTE — Progress Notes (Signed)
Cardiology Office Note   Date:  03/07/2021   ID:  Devin Hanna, Devin Hanna [birth year], MRN [id number]  PCP:  [name], MD  Cardiologist:   [name], MD   Chief Complaint  Patient presents with   Other    4 month f/u c/o back pain since having MI. Pt would like to discuss Entresto dose. Meds reviewed verbally with pt.       History of Present Illness: Devin Hanna is a 71 y.o. male who presents for a followup visit regarding coronary artery disease and chronic systolic heart failure.  He has known CAD with past RCA stents and prior MI, chronic systolic heart failure, peripheral neuropathy, left bundle branch block, prior GI bleed GERD, DM, HTN and HLD.  He was hospitalized in June of 2021 with acute on chronic systolic heart failure with acute kidney injury.  Echocardiogram showed an EF of 25 to 30%.  He underwent a right and left cardiac catheterization in July, 2021 which showed patent RCA stent with mild in-stent restenosis and moderate disease affecting the left system with moderate calcifications.  Right heart catheterization showed mildly elevated filling pressures and mild pulmonary hypertension with normal cardiac output.  Outpatient renal artery duplex showed no evidence of renal artery stenosis.  He was started on [name]. Hydralazine was added for blood pressure control.    Repeat echocardiogram in January 2022 showed an EF of 30 to 35%.  He underwent biventricular ICD in February by Dr. Quentin Ore.    He had ICD shocks due to atrial fibrillation and was started on mexiletine by Dr. Quentin Ore.  He had ICD shock on January 1 due to ventricular fibrillation and the dose of mexiletine was increased.  He did have worsening heart failure that required increasing the dose of furosemide.  He also has been dealing with liver cancer status postradiation beads with plans to repeat MRI in 3 months.  He has been doing reasonably well with no chest pain or worsening dyspnea.   Lower extremity edema resolved.  Blood pressure continues to be on the low side.  He lost 20 pounds since his last visit with me in September.  He reports poor appetite but some improvement recently.  Past Medical History:  Diagnosis Date   Acute respiratory failure with hypoxia ([place]) 06/2016   CAD (coronary artery disease)    a. Remote PCI of the RCA;  b. 03/2013 Inf STEMI/PCI: LAD 60-70d, RCA 99p ISR(rota/CBA/DES);  c. 09/2014 MV: inf and infsept infarct, EF 35-40%;  d. 09/2014 Cath: patent RCA stent, stable LAD dzs, EF 40%-->Med rx.   CHF (congestive heart failure) ([place])    Diabetes mellitus    sees Dr. Hardin Negus in [place]   GERD (gastroesophageal reflux disease)    HFrEF (heart failure with reduced ejection fraction) ([place])    Hyperlipidemia    Hypertension    Ischemic cardiomyopathy    a. 09/2014 EF 40% by V gram;  b. 06/2016 Echo: EF 40, diff HK, Gr1 DD, inf HK, no mural thrombus. Mild MR, mildly dil LA, mildly dil RV.    LBBB (left bundle branch block)    Old   Liver mass    Morbid obesity ([place])    Peripheral neuropathy    Upper GI bleed    Vertigo     Past Surgical History:  Procedure Laterality Date   BIV ICD INSERTION CRT-D N/A 04/08/2020   Procedure: BIV ICD INSERTION CRT-D;  Surgeon: Vickie Epley, MD;  Location: [place] CV LAB;  Service: Cardiovascular;  Laterality: N/A;   CARDIAC CATHETERIZATION N/A 10/03/2014   Procedure: Left Heart Cath and Coronary Angiography;  Surgeon: [name], MD;  Location: [place] CV LAB;  Service: Cardiovascular;  Laterality: N/A;   CIRCUMCISION N/A 07/22/2015   Procedure: CIRCUMCISION ADULT;  Surgeon: Hollice Espy, MD;  Location: [facility] ORS;  Service: Urology;  Laterality: N/A;   COLONOSCOPY WITH PROPOFOL N/A 01/12/2019   Procedure: COLONOSCOPY WITH PROPOFOL;  Surgeon: Lucilla Lame, MD;  Location: [facility] ENDOSCOPY;  Service: Endoscopy;  Laterality: N/A;   CORONARY STENT PLACEMENT     ESOPHAGOGASTRODUODENOSCOPY (EGD) WITH  PROPOFOL N/A 09/12/2014   Procedure: ESOPHAGOGASTRODUODENOSCOPY (EGD) WITH PROPOFOL;  Surgeon: Ronald Lobo, MD;  Location: [facility] ENDOSCOPY;  Service: Endoscopy;  Laterality: N/A;   EXTERNAL FIXATION LEG Left 04/03/2012   Procedure: EXTERNAL FIXATION LEG;  Surgeon: Wylene Simmer, MD;  Location: [place];  Service: Orthopedics;  Laterality: Left;   EXTERNAL FIXATION REMOVAL Left 04/14/2012   Procedure: REMOVAL EXTERNAL FIXATION LEG;  Surgeon: Wylene Simmer, MD;  Location: [place];  Service: Orthopedics;  Laterality: Left;   IR 3D INDEPENDENT WKST  10/16/2020   IR 3D INDEPENDENT WKST  10/30/2020   IR ANGIOGRAM SELECTIVE EACH ADDITIONAL VESSEL  10/16/2020   IR ANGIOGRAM SELECTIVE EACH ADDITIONAL VESSEL  10/30/2020   IR ANGIOGRAM VISCERAL SELECTIVE  10/16/2020   IR ANGIOGRAM VISCERAL SELECTIVE  10/30/2020   IR EMBO ARTERIAL NOT HEMORR HEMANG INC GUIDE ROADMAPPING  10/16/2020   IR EMBO TUMOR ORGAN ISCHEMIA INFARCT INC GUIDE ROADMAPPING  10/30/2020   IR RADIOLOGIST EVAL & MGMT  09/06/2020   IR RADIOLOGIST EVAL & MGMT  11/27/2020   IR RADIOLOGIST EVAL & MGMT  01/23/2021   IR US GUIDE VASC ACCESS RIGHT  10/16/2020   IR US GUIDE VASC ACCESS RIGHT  10/30/2020   IRRIGATION AND DEBRIDEMENT KNEE Left 04/03/2012   Procedure: IRRIGATION AND DEBRIDEMENT KNEE;  Surgeon: Wylene Simmer, MD;  Location: [place];  Service: Orthopedics;  Laterality: Left;   LEFT HEART CATHETERIZATION WITH CORONARY ANGIOGRAM N/A 04/05/2013   Procedure: LEFT HEART CATHETERIZATION WITH CORONARY ANGIOGRAM;  Surgeon: [name], MD;  Location: [place] CATH LAB;  Service: Cardiovascular;  Laterality: N/A;   MASS EXCISION N/A 07/22/2015   Procedure: EXCISION PENILE MASS;  Surgeon: Hollice Espy, MD;  Location: [facility] ORS;  Service: Urology;  Laterality: N/A;   ORIF TIBIA PLATEAU Left 04/14/2012   Procedure: OPEN REDUCTION INTERNAL FIXATION (ORIF) TIBIAL PLATEAU;  Surgeon: Wylene Simmer, MD;  Location: [place];  Service: Orthopedics;  Laterality: Left;   PERCUTANEOUS CORONARY  ROTOBLATOR INTERVENTION (PCI-R) N/A 04/06/2013   Procedure: PERCUTANEOUS CORONARY ROTOBLATOR INTERVENTION (PCI-R);  Surgeon: [name], MD;  Location[facility] CATH LAB;  Service: Cardiovascular;  Laterality: N/A;   RIGHT/LEFT HEART CATH AND CORONARY ANGIOGRAPHY N/A 08/21/2019   Procedure: RIGHT/LEFT HEART CATH AND CORONARY ANGIOGRAPHY;  Surgeon: [name], MD;  Location: [place] CV LAB;  Service: Cardiovascular;  Laterality: N/A;     Current Outpatient Medications  Medication Sig Dispense Refill   atorvastatin (LIPITOR) 40 MG tablet TAKE ONE TABLET BY MOUTH EVERY DAY 90 tablet 0   CINNAMON PO Take 1,000 mg by mouth in the morning and at bedtime.     ciprofloxacin (CIPRO) 500 MG tablet Take 1 tablet (500 mg total) by mouth 2 (two) times daily. 14 tablet 0   clopidogrel (PLAVIX) 75 MG tablet TAKE ONE TABLET EVERY MORNING WITH BREAKFAST 90 tablet 3  cyclobenzaprine (FLEXERIL) 5 MG tablet Take 1 tablet (5 mg total) by mouth 2 (two) times daily as needed for muscle spasms (will scause drowsiness take caution.). 30 tablet 0   dapagliflozin propanediol ([name]) 10 MG TABS tablet Take 1 tablet (10 mg total) by mouth daily before breakfast. 90 tablet 3   Dulaglutide ([name]) 3 PR/9.1MB SOPN Inject 3 mg as directed once a week. 6 mL 1   ferrous sulfate 325 (65 FE) MG tablet Take 162.5 mg by mouth daily with breakfast.     furosemide (LASIX) 40 MG tablet Take 1 tablet (40 mg total) by mouth daily. 90 tablet 1   gabapentin (NEURONTIN) 300 MG capsule Take 1 capsule (300 mg total) by mouth every morning AND 1 capsule (300 mg total) daily with lunch AND 2 capsules (600 mg total) at bedtime. 360 capsule 3   metoprolol succinate (TOPROL-XL) 50 MG 24 hr tablet Take 1.5 tablets (75 mg total) by mouth in the morning and at bedtime. Take with or immediately following a meal. 270 tablet 3   mexiletine (MEXITIL) 150 MG capsule Take 1 capsule (150 mg total) by mouth 3 (three) times daily. 270 capsule  3   Multiple Vitamin (MULTIVITAMIN WITH MINERALS) TABS Take 1 tablet by mouth daily. Multivitamin for Men 50+     nitroGLYCERIN (NITROSTAT) 0.4 MG SL tablet Place 1 tablet (0.4 mg total) under the tongue every 5 (five) minutes as needed for chest pain. 25 tablet 0   pantoprazole (PROTONIX) 40 MG tablet TAKE 1 TABLET BY MOUTH TWICE DAILY. 180 tablet 3   polyethylene glycol powder (GLYCOLAX/MIRALAX) 17 GM/SCOOP powder Take 17 g by mouth 2 (two) times daily as needed. 3350 g 1   potassium chloride SA (KLOR-CON M) 20 MEQ tablet Take 1 tablet (20 mEq total) by mouth daily. 90 tablet 3   sacubitril-valsartan (ENTRESTO) 24-26 MG Take 1 tablet by mouth 2 (two) times daily. (Patient taking differently: Take 0.5 tablets by mouth 2 (two) times daily.)     No current facility-administered medications for this visit.    Allergies:   Patient has no known allergies.    Social History:  The patient  reports that he quit smoking about 25 years ago. His smoking use included cigarettes. He has a 120.00 pack-year smoking history. He has quit using smokeless tobacco. He reports that he does not drink alcohol and does not use drugs.   Family History:  The patient's family history includes Breast cancer in his sister; Coronary artery disease in his brother and father; Lung cancer in his brother; Prostate cancer in his brother; Stroke in his sister.    ROS:  Please see the history of present illness.   Otherwise, review of systems are positive for none.   All other systems are reviewed and negative.    PHYSICAL EXAM: VS:  BP 100/70 (BP Location: Left Arm, Patient Position: Sitting, Cuff Size: Normal)    Pulse 70    Ht 5' 10"  (1.778 m)    Wt 219 lb 8 oz (99.6 kg)    SpO2 93%    BMI 31.49 kg/m  , BMI Body mass index is 31.49 kg/m. GEN: Well nourished, well developed, in no acute distress  HEENT: normal  Neck:no JVD, no carotid bruits, or masses Cardiac: RRR; no  rubs, or gallops. . 2/6 systolic ejection murmur in  the aortic area which is early peaking .  Mild to moderate bilateral leg edema Respiratory:  clear to auscultation bilaterally, normal work of  breathing GI: soft, nontender, nondistended, + BS MS: no deformity or atrophy  Skin: warm and dry, no rash Neuro:  Strength and sensation are intact Psych: euthymic mood, full affect   EKG:  EKG is  ordered today. EKG showed ventricular paced rhythm with a heart rate of 70 bpm.  Recent Labs: 07/05/2020: TSH 3.420 02/08/2021: B Natriuretic Peptide 599.5 02/25/2021: ALT 48; BUN 17; Creatinine, Ser 1.40; Hemoglobin 16.9; Magnesium 2.4; Platelets 275.0; Potassium 5.3; Pro B Natriuretic peptide (BNP) 317.0; Sodium 138    Lipid Panel    Component Value Date/Time   CHOL 103 06/26/2020 0816   CHOL 103 06/07/2014 0824   TRIG 159.0 (H) 06/26/2020 0816   HDL 34.50 (L) 06/26/2020 0816   HDL 38 (L) 06/07/2014 0824   CHOLHDL 3 06/26/2020 0816   VLDL 31.8 06/26/2020 0816   LDLCALC 37 06/26/2020 0816   LDLCALC 59 03/30/2018 0930      Wt Readings from Last 3 Encounters:  03/07/21 219 lb 8 oz (99.6 kg)  02/25/21 220 lb (99.8 kg)  02/18/21 226 lb 12.8 oz (102.9 kg)        ASSESSMENT AND PLAN:  1.  Coronary artery disease involving native coronary arteries without angina: He is doing reasonably well with no anginal symptoms. Continue medical therapy.  He is on Plavix without aspirin given previous history of GI bleed and ulcers.    2. Chronic systolic heart failure: He has volume overload recently but that has resolved completely by today's exam he appears to be euvolemic.  Continue furosemide 40 mg once daily.  He is scheduled for repeat labs first week of February.  If potassium continues to be high, we can discontinue potassium supplement.  His blood pressure continues to be on the low side and thus we will continue with a half a tablet of Entresto twice daily.  I suspect that his weight loss is contributing to low blood pressure.  He was switched  from carvedilol to Toprol to better treat his tachyarrhythmia.  He continues to be on [name].  3. Essential hypertension: Blood pressure is borderline low.  Continue same medications  4. Hyperlipidemia: Most recent lipid profile showed an LDL of 44.  Thus, we will continue current dose of atorvastatin.  5.  Type 2 diabetes.  Diabetes control has improved.  I reviewed his labs which showed improvement of hemoglobin A1c from 7.1 to 6.3.  6.  Status post biventricular ICD placement: Followed by Dr. Quentin Ore.  7.  Atrial fibrillation: Noted on device interrogation and likely responsible for recent inappropriate ICD shock.  Not on anticoagulation at the present time due to increased bleeding risk .   Disposition:   FU with me in 4 months  Signed,  [name], MD  03/07/2021 10:52 AM    [place]

## 2021-03-21 ENCOUNTER — Other Ambulatory Visit: Payer: Self-pay

## 2021-03-21 ENCOUNTER — Other Ambulatory Visit (INDEPENDENT_AMBULATORY_CARE_PROVIDER_SITE_OTHER): Payer: PPO

## 2021-03-21 DIAGNOSIS — K5900 Constipation, unspecified: Secondary | ICD-10-CM

## 2021-03-21 DIAGNOSIS — N390 Urinary tract infection, site not specified: Secondary | ICD-10-CM

## 2021-03-21 LAB — CBC WITH DIFFERENTIAL/PLATELET
Basophils Absolute: 0.1 10*3/uL (ref 0.0–0.1)
Basophils Relative: 0.9 % (ref 0.0–3.0)
Eosinophils Absolute: 0.1 10*3/uL (ref 0.0–0.7)
Eosinophils Relative: 1.4 % (ref 0.0–5.0)
HCT: 49.3 % (ref 39.0–52.0)
Hemoglobin: 16.4 g/dL (ref 13.0–17.0)
Lymphocytes Relative: 10 % — ABNORMAL LOW (ref 12.0–46.0)
Lymphs Abs: 0.6 10*3/uL — ABNORMAL LOW (ref 0.7–4.0)
MCHC: 33.3 g/dL (ref 30.0–36.0)
MCV: 97.8 fl (ref 78.0–100.0)
Monocytes Absolute: 0.5 10*3/uL (ref 0.1–1.0)
Monocytes Relative: 8.7 % (ref 3.0–12.0)
Neutro Abs: 4.8 10*3/uL (ref 1.4–7.7)
Neutrophils Relative %: 79 % — ABNORMAL HIGH (ref 43.0–77.0)
Platelets: 162 10*3/uL (ref 150.0–400.0)
RBC: 5.04 Mil/uL (ref 4.22–5.81)
RDW: 15.1 % (ref 11.5–15.5)
WBC: 6.1 10*3/uL (ref 4.0–10.5)

## 2021-03-21 LAB — COMPREHENSIVE METABOLIC PANEL
ALT: 106 U/L — ABNORMAL HIGH (ref 0–53)
AST: 117 U/L — ABNORMAL HIGH (ref 0–37)
Albumin: 3.3 g/dL — ABNORMAL LOW (ref 3.5–5.2)
Alkaline Phosphatase: 401 U/L — ABNORMAL HIGH (ref 39–117)
BUN: 14 mg/dL (ref 6–23)
CO2: 31 mEq/L (ref 19–32)
Calcium: 9.2 mg/dL (ref 8.4–10.5)
Chloride: 102 mEq/L (ref 96–112)
Creatinine, Ser: 1.27 mg/dL (ref 0.40–1.50)
GFR: 56.26 mL/min — ABNORMAL LOW (ref >60.00)
Glucose, Bld: 91 mg/dL (ref 70–99)
Potassium: 4.6 mEq/L (ref 3.5–5.1)
Sodium: 141 mEq/L (ref 135–145)
Total Bilirubin: 1.5 mg/dL — ABNORMAL HIGH (ref 0.2–1.2)
Total Protein: 6.7 g/dL (ref 6.0–8.3)

## 2021-03-21 LAB — URINALYSIS
Bilirubin Urine: NEGATIVE
Ketones, ur: NEGATIVE
Leukocytes,Ua: NEGATIVE
Nitrite: NEGATIVE
Specific Gravity, Urine: 1.015 (ref 1.000–1.030)
Total Protein, Urine: NEGATIVE
Urine Glucose: 500 — AB
Urobilinogen, UA: 1 (ref 0.0–1.0)
pH: 6.5 (ref 5.0–8.0)

## 2021-03-21 NOTE — Progress Notes (Signed)
Trace blood no bacteria in urine, glucose in urine has decreased. CBC stable.  CMP is chronic abnormality, total bilirubin elevated and AST and ALT elevated, GFR kidney function is decreased but stable. He also has been dealing with liver cancer status postradiation beads with plans to repeat MRI in 3 months. Be sure he has a follow up wit his PCP to discuss labs and microscopic hematuria- ? To urology if desires.

## 2021-03-31 ENCOUNTER — Telehealth: Payer: Self-pay | Admitting: Cardiology

## 2021-03-31 NOTE — Telephone Encounter (Signed)
The patients co pay went up to $90 for this medication. The daughter spoke to the insurance company and they stated they would fax over a "predetermination" to make the medication cheaper by changing the tier.   Will forward to medication patient assistance and [name] to assist.

## 2021-03-31 NOTE — Telephone Encounter (Signed)
Pt c/o medication issue:  1. Name of Medication:  mexiletine (MEXITIL) 150 MG capsule  2. How are you currently taking this medication (dosage and times per day)?   3. Are you having a reaction (difficulty breathing--STAT)?   4. What is your medication issue?   Patient's daughter, [name], states [employer] sent a fax, requesting to switch tiers for this medication. [name] would like to confirm whether this has been received. Please advise.

## 2021-04-03 ENCOUNTER — Other Ambulatory Visit: Payer: Self-pay | Admitting: Interventional Radiology

## 2021-04-03 DIAGNOSIS — C220 Liver cell carcinoma: Secondary | ICD-10-CM

## 2021-04-03 NOTE — Telephone Encounter (Signed)
Patient's daughter is calling back to follow up on conversation on Monday about medication. Please call back

## 2021-04-04 ENCOUNTER — Other Ambulatory Visit: Payer: Self-pay

## 2021-04-04 DIAGNOSIS — C220 Liver cell carcinoma: Secondary | ICD-10-CM

## 2021-04-04 NOTE — Telephone Encounter (Signed)
Yes, just checked GoodRx and 90 capsules of mexiletine are:  25.53 at [employer] 41.37 at [employer] 52.04 at [employer]

## 2021-04-04 NOTE — Telephone Encounter (Signed)
**Note De-Identified Devin Hanna Obfuscation** No answer so I left a message on her VM advising that I have not received any forms for the pts Mexiletine. I also recommended that they try using GoodRx as often times Mexiletine is less expensive using it then ins coverage. I did leave my name and the office phone number so she can call back if she has any questions.

## 2021-04-07 ENCOUNTER — Ambulatory Visit (INDEPENDENT_AMBULATORY_CARE_PROVIDER_SITE_OTHER): Payer: PPO | Admitting: Family Medicine

## 2021-04-07 ENCOUNTER — Encounter: Payer: Self-pay | Admitting: Family Medicine

## 2021-04-07 ENCOUNTER — Other Ambulatory Visit: Payer: Self-pay

## 2021-04-07 VITALS — BP 120/78 | HR 70 | Temp 98.7°F | Ht 70.0 in | Wt 229.0 lb

## 2021-04-07 DIAGNOSIS — R8290 Unspecified abnormal findings in urine: Secondary | ICD-10-CM | POA: Diagnosis not present

## 2021-04-07 DIAGNOSIS — N3001 Acute cystitis with hematuria: Secondary | ICD-10-CM | POA: Diagnosis not present

## 2021-04-07 DIAGNOSIS — S22000A Wedge compression fracture of unspecified thoracic vertebra, initial encounter for closed fracture: Secondary | ICD-10-CM

## 2021-04-07 DIAGNOSIS — I872 Venous insufficiency (chronic) (peripheral): Secondary | ICD-10-CM

## 2021-04-07 DIAGNOSIS — K5900 Constipation, unspecified: Secondary | ICD-10-CM | POA: Diagnosis not present

## 2021-04-07 DIAGNOSIS — I5022 Chronic systolic (congestive) heart failure: Secondary | ICD-10-CM | POA: Diagnosis not present

## 2021-04-07 DIAGNOSIS — C220 Liver cell carcinoma: Secondary | ICD-10-CM

## 2021-04-07 DIAGNOSIS — M545 Low back pain, unspecified: Secondary | ICD-10-CM | POA: Diagnosis not present

## 2021-04-07 DIAGNOSIS — E1142 Type 2 diabetes mellitus with diabetic polyneuropathy: Secondary | ICD-10-CM

## 2021-04-07 LAB — POCT GLYCOSYLATED HEMOGLOBIN (HGB A1C): Hemoglobin A1C: 5.7 % — AB (ref 4.0–5.6)

## 2021-04-07 NOTE — Progress Notes (Signed)
[name], MD Phone: [phone]  Devin Hanna is a 71 y.o. male who presents today for f/u.  DIABETES Disease Monitoring: Blood Sugar ranges-120-130 Polyuria/phagia/dipsia- no       Medications: Compliance- taking trulicity, farxiga Hypoglycemic symptoms- no  CHF: The patient is on Entresto, metoprolol, Lasix, and [place].  He has had no chest pain or shortness of breath.  He has chronic edema that is generally stable.  No orthopnea or PND.  He did go into ventricular fibrillation around [place] and received an ICD shock.  He notes a syncopal episode with this.  He followed up with EP and was  on mexiletine and this dose was increased at that time.  He was also transitioned to extended release metoprolol.  Hepatic carcinoma: the patient has been following with oncology for this. He saw general surgery and they noted he would be higher risk if he had surgery for this. He subsequently saw IR and had radiation beads placed. He had follow-up imaging that seemed to indicated possible residual foci of cancer. His LFTs have trended up slightly following treatment. He has follow-up imaging in April and will follow-up with his specialist after this. He notes no abdominal pain.   Back pain: Patient reports back pain that happened after he had his ICD shock.  He notes being subsequently treated for constipation and the back pain resolved. It was recommended that he see orthopedics for compression deformities on x-ray that was obtained by FNP, though the referral was not placed.    Social History   Tobacco Use  Smoking Status Former   Packs/day: 3.00   Years: 40.00   Pack years: 120.00   Types: Cigarettes   Quit date: 04/05/1995   Years since quitting: 26.0  Smokeless Tobacco Former  Tobacco Comments   started smoking at age 46    Current Outpatient Medications on File Prior to Visit  Medication Sig Dispense Refill   atorvastatin (LIPITOR) 40 MG tablet TAKE ONE TABLET BY MOUTH  EVERY DAY 90 tablet 0   CINNAMON PO Take 1,000 mg by mouth in the morning and at bedtime.     ciprofloxacin (CIPRO) 500 MG tablet Take 1 tablet (500 mg total) by mouth 2 (two) times daily. 14 tablet 0   clopidogrel (PLAVIX) 75 MG tablet TAKE ONE TABLET EVERY MORNING WITH BREAKFAST 90 tablet 3   cyclobenzaprine (FLEXERIL) 5 MG tablet Take 1 tablet (5 mg total) by mouth 2 (two) times daily as needed for muscle spasms (will scause drowsiness take caution.). 30 tablet 0   dapagliflozin propanediol ([name]) 10 MG TABS tablet Take 1 tablet (10 mg total) by mouth daily before breakfast. 90 tablet 3   Dulaglutide ([name]) 3 GY/6.9SW SOPN Inject 3 mg as directed once a week. 6 mL 1   ferrous sulfate 325 (65 FE) MG tablet Take 162.5 mg by mouth daily with breakfast.     furosemide (LASIX) 40 MG tablet Take 1 tablet (40 mg total) by mouth daily. 90 tablet 1   gabapentin (NEURONTIN) 300 MG capsule Take 1 capsule (300 mg total) by mouth every morning AND 1 capsule (300 mg total) daily with lunch AND 2 capsules (600 mg total) at bedtime. 360 capsule 3   metoprolol succinate (TOPROL-XL) 50 MG 24 hr tablet Take 1.5 tablets (75 mg total) by mouth in the morning and at bedtime. Take with or immediately following a meal. 270 tablet 3   mexiletine (MEXITIL) 150 MG capsule Take 1 capsule (150 mg total)  by mouth 3 (three) times daily. 270 capsule 3   Multiple Vitamin (MULTIVITAMIN WITH MINERALS) TABS Take 1 tablet by mouth daily. Multivitamin for Men 50+     nitroGLYCERIN (NITROSTAT) 0.4 MG SL tablet Place 1 tablet (0.4 mg total) under the tongue every 5 (five) minutes as needed for chest pain. 25 tablet 0   pantoprazole (PROTONIX) 40 MG tablet TAKE 1 TABLET BY MOUTH TWICE DAILY. 180 tablet 3   polyethylene glycol powder (GLYCOLAX/MIRALAX) 17 GM/SCOOP powder Take 17 g by mouth 2 (two) times daily as needed. 3350 g 1   potassium chloride SA (KLOR-CON M) 20 MEQ tablet Take 1 tablet (20 mEq total) by mouth daily. 90  tablet 3   sacubitril-valsartan (ENTRESTO) 24-26 MG Take 1 tablet by mouth 2 (two) times daily. (Patient taking differently: Take 0.5 tablets by mouth 2 (two) times daily.)     No current facility-administered medications on file prior to visit.     ROS see history of present illness  Objective  Physical Exam Vitals:   04/07/21 1127  BP: 120/78  Pulse: 70  Temp: 98.7 F (37.1 C)  SpO2: 98%    BP Readings from Last 3 Encounters:  04/07/21 120/78  03/07/21 100/70  02/25/21 120/74   Wt Readings from Last 3 Encounters:  04/07/21 229 lb (103.9 kg)  03/07/21 219 lb 8 oz (99.6 kg)  02/25/21 220 lb (99.8 kg)    Physical Exam Constitutional:      General: He is not in acute distress.    Appearance: He is not diaphoretic.  Cardiovascular:     Rate and Rhythm: Normal rate and regular rhythm.     Heart sounds: Normal heart sounds.  Pulmonary:     Effort: Pulmonary effort is normal.     Breath sounds: Normal breath sounds.  Musculoskeletal:     Comments: No midline spine tenderness, no midline spine step-off, no muscular back tenderness, 2+ pitting edema bilaterally with swelling slightly worse on the right compared to the left  Skin:    General: Skin is warm and dry.  Neurological:     Mental Status: He is alert.     Comments: 5/5 strength bilateral quads, hamstrings, plantarflexion, and dorsiflexion, sensation to light touch intact bilateral lower extremities     Assessment/Plan: Please see individual problem list.  Problem List Items Addressed This Visit     Acute cystitis with hematuria    Recently treated for UTI.  Symptoms have improved.  His follow-up urinalysis did not have a urine micro with it and thus we will have him complete that.  He was unable to urinate while in the office and he will bring this back later.      Acute midline low back pain without sciatica    Currently asymptomatic.  Given compression deformities I will go ahead and place the orthopedic  referral.      Chronic systolic heart failure ([place])    Chronic issue.  Seems to be stable.  He will continue Entresto, metoprolol, Lasix, and [place].  He will continue to follow with his cardiology specialists.      Constipation    Improved with prior treatment.      DM type 2 (diabetes mellitus, type 2) ([place]) - Primary    Very well controlled.  He will continue [name] 10 mg once daily and [name] 3 mg weekly.  He will monitor for any low sugars.      Relevant Orders   POCT HgB A1C (Completed)  Hepatocellular carcinoma [facility])    Patient has undergone radiation bead treatment with interventional radiology.  There is still some residual disease on MRI.  They have planned follow-up on this in April with another MRI.  LFTs have trended up some.  I will check with his interventional radiologist to see if that can occur status posttreatment.      Venous insufficiency    This is likely the cause of his chronic leg swelling.  Discussed elevating his legs as much as he is able to.      Other Visit Diagnoses     Abnormal urinalysis       Relevant Orders   Urine Microscopic   Compression fracture of body of thoracic vertebra [facility])       Relevant Orders   Ambulatory referral to Orthopedic Surgery       Return in about 3 months (around 07/05/2021).  This visit occurred during the SARS-CoV-2 public health emergency.  Safety protocols were in place, including screening questions prior to the visit, additional usage of staff PPE, and extensive cleaning of exam room while observing appropriate contact time as indicated for disinfecting solutions.   I have spent 46 minutes in the care of this patient regarding history taking, chart review, completion of exam, discussion of plan, placing orders.   [name], MD [place]

## 2021-04-07 NOTE — Assessment & Plan Note (Signed)
Recently treated for UTI.  Symptoms have improved.  His follow-up urinalysis did not have a urine micro with it and thus we will have him complete that.  He was unable to urinate while in the office and he will bring this back later.

## 2021-04-07 NOTE — Addendum Note (Signed)
Addended by: Leeanne Rio on: 04/07/2021 04:42 PM   Modules accepted: Orders

## 2021-04-07 NOTE — Patient Instructions (Addendum)
Nice to see you. Your A1c is very well controlled.  Please continue with [name] and [place].  If you start to notice your sugar dropping less than 70 or feeling as though it is dropping please let us know right away.  He had an x-ray in our clinic with compression deformities noted it was recommended by the FNP that he see orthopedics and the patient was okay with this though it does not appear that the referral was placed.  He notes no back pain at this time.  It it was recommended

## 2021-04-07 NOTE — Assessment & Plan Note (Signed)
This is likely the cause of his chronic leg swelling.  Discussed elevating his legs as much as he is able to.

## 2021-04-07 NOTE — Assessment & Plan Note (Signed)
Improved with prior treatment.

## 2021-04-07 NOTE — Assessment & Plan Note (Signed)
Very well controlled.  He will continue [name] 10 mg once daily and [name] 3 mg weekly.  He will monitor for any low sugars.

## 2021-04-07 NOTE — Assessment & Plan Note (Signed)
Chronic issue.  Seems to be stable.  He will continue Entresto, metoprolol, Lasix, and [place].  He will continue to follow with his cardiology specialists.

## 2021-04-07 NOTE — Assessment & Plan Note (Signed)
Currently asymptomatic.  Given compression deformities I will go ahead and place the orthopedic referral.

## 2021-04-07 NOTE — Assessment & Plan Note (Signed)
Patient has undergone radiation bead treatment with interventional radiology.  There is still some residual disease on MRI.  They have planned follow-up on this in April with another MRI.  LFTs have trended up some.  I will check with his interventional radiologist to see if that can occur status posttreatment.

## 2021-04-08 ENCOUNTER — Ambulatory Visit (INDEPENDENT_AMBULATORY_CARE_PROVIDER_SITE_OTHER): Payer: PPO

## 2021-04-08 DIAGNOSIS — I4819 Other persistent atrial fibrillation: Secondary | ICD-10-CM | POA: Diagnosis not present

## 2021-04-08 DIAGNOSIS — I129 Hypertensive chronic kidney disease with stage 1 through stage 4 chronic kidney disease, or unspecified chronic kidney disease: Secondary | ICD-10-CM | POA: Diagnosis not present

## 2021-04-08 DIAGNOSIS — N1832 Chronic kidney disease, stage 3b: Secondary | ICD-10-CM | POA: Diagnosis not present

## 2021-04-08 DIAGNOSIS — I5022 Chronic systolic (congestive) heart failure: Secondary | ICD-10-CM | POA: Diagnosis not present

## 2021-04-08 DIAGNOSIS — D631 Anemia in chronic kidney disease: Secondary | ICD-10-CM | POA: Diagnosis not present

## 2021-04-08 DIAGNOSIS — E1122 Type 2 diabetes mellitus with diabetic chronic kidney disease: Secondary | ICD-10-CM | POA: Diagnosis not present

## 2021-04-08 LAB — CUP PACEART REMOTE DEVICE CHECK
Battery Remaining Longevity: 67 mo
Battery Remaining Percentage: 82 %
Battery Voltage: 2.98 V
Brady Statistic AP VP Percent: 0 %
Brady Statistic AP VS Percent: 0 %
Brady Statistic AS VP Percent: 0 %
Brady Statistic AS VS Percent: 0 %
Brady Statistic RA Percent Paced: 1 %
Date Time Interrogation Session: 20230228020029
HighPow Impedance: 74 Ohm
Implantable Lead Implant Date: 20220228
Implantable Lead Implant Date: 20220228
Implantable Lead Implant Date: 20220228
Implantable Lead Location: 753858
Implantable Lead Location: 753859
Implantable Lead Location: 753860
Implantable Pulse Generator Implant Date: 20220228
Lead Channel Impedance Value: 1100 Ohm
Lead Channel Impedance Value: 440 Ohm
Lead Channel Impedance Value: 540 Ohm
Lead Channel Pacing Threshold Amplitude: 1 V
Lead Channel Pacing Threshold Amplitude: 1.5 V
Lead Channel Pacing Threshold Amplitude: 2.25 V
Lead Channel Pacing Threshold Pulse Width: 0.5 ms
Lead Channel Pacing Threshold Pulse Width: 0.5 ms
Lead Channel Pacing Threshold Pulse Width: 0.8 ms
Lead Channel Sensing Intrinsic Amplitude: 1 mV
Lead Channel Sensing Intrinsic Amplitude: 12 mV
Lead Channel Setting Pacing Amplitude: 2.5 V
Lead Channel Setting Pacing Amplitude: 2.5 V
Lead Channel Setting Pacing Amplitude: 2.75 V
Lead Channel Setting Pacing Pulse Width: 0.5 ms
Lead Channel Setting Pacing Pulse Width: 0.8 ms
Lead Channel Setting Sensing Sensitivity: 0.5 mV
Pulse Gen Serial Number: 810018869

## 2021-04-08 LAB — URINALYSIS, MICROSCOPIC ONLY
Hyaline Cast: NONE SEEN /LPF
RBC / HPF: NONE SEEN /HPF (ref 0–2)
Squamous Epithelial / HPF: NONE SEEN /HPF (ref <=5)

## 2021-04-08 NOTE — Telephone Encounter (Signed)
Pt c/o medication issue:  1. Name of Medication:  mexiletine (MEXITIL) 150 MG capsule  2. How are you currently taking this medication (dosage and times per day)?   3. Are you having a reaction (difficulty breathing--STAT)?   4. What is your medication issue?   Patient's daughter, Raiford Noble, now following up. States she just got off the phone with patient's insurance, [employer] regarding medication. She states they suggested 4 alternatives and she would like to know if the patient is eligible:  Pacerone 200 MG Amiodarone Digoxin Sotalol  Please advise.

## 2021-04-08 NOTE — Telephone Encounter (Signed)
**Note De-Identified Devin Hanna Obfuscation** I s/w Latashia, the pts daughter and DPR about using GoodRx for the pts Mexiletine but she states that she just s/w [employer] who advised her that the pt can take the following in place of Mexiletine: Pacerone 200 MG Amiodarone Digoxin Sotalol And that these meds are all a tier 1 and will cost the pt $5 a month.  Raiford Noble states that the pt can better afford $5 for one of the above mentioned medications than the cost of Mexiletine using a GoodRx card.  She is aware that we will call her back with Dr Mardene Speak recommendations.

## 2021-04-09 ENCOUNTER — Telehealth: Payer: Self-pay | Admitting: Family Medicine

## 2021-04-09 DIAGNOSIS — R7989 Other specified abnormal findings of blood chemistry: Secondary | ICD-10-CM

## 2021-04-09 NOTE — Telephone Encounter (Signed)
I called and LVM for patient to call back to schedule a lab appointment.  Devin Hanna,cma  ?

## 2021-04-09 NOTE — Telephone Encounter (Signed)
Please let the patient know that I heard back from the physician who did the procedure for his radiation beads.  They noted we should repeat his liver enzymes in 2 to 3 weeks.  I have placed orders.  Thanks. ?

## 2021-04-09 NOTE — Telephone Encounter (Signed)
-----  Message from Suzette Battiest, MD sent at 04/07/2021  2:14 PM EST ----- ?Certainly possible, but this gradual trend is non-specific.  I'd maybe get another set in 2-3 weeks to trend it. ? ?Thanks for reaching out, ? ?Dylan ?----- Message ----- ?From: [name], MD ?Sent: 04/07/2021  12:12 PM EST ?To: Suzette Battiest, MD ? ?Hi Dr Serafina Royals, ? ?I saw Mr Hadsall for follow-up today. His recent LFTs have trended up some with an AST of 117 and an ALT of 106 and an alk phos of 401. I wanted to make you aware of this. He's had not abdominal pain. Can this occur after the intervention you performed last year? Thanks for your help. ? ?[name] ? ?

## 2021-04-10 DIAGNOSIS — M1712 Unilateral primary osteoarthritis, left knee: Secondary | ICD-10-CM | POA: Diagnosis not present

## 2021-04-10 DIAGNOSIS — T8484XA Pain due to internal orthopedic prosthetic devices, implants and grafts, initial encounter: Secondary | ICD-10-CM | POA: Diagnosis not present

## 2021-04-10 DIAGNOSIS — S32010A Wedge compression fracture of first lumbar vertebra, initial encounter for closed fracture: Secondary | ICD-10-CM | POA: Diagnosis not present

## 2021-04-10 DIAGNOSIS — S22080A Wedge compression fracture of T11-T12 vertebra, initial encounter for closed fracture: Secondary | ICD-10-CM | POA: Diagnosis not present

## 2021-04-11 NOTE — Telephone Encounter (Signed)
I called and spoke with the patients daughter and the patient is scheduled for a lab appointment in 3 weeks per provider.  Jaelene Garciagarcia,cma  ?

## 2021-04-15 NOTE — Progress Notes (Unsigned)
PCP:  [name], MD Primary Cardiologist: [name], MD Electrophysiologist: Vickie Epley, MD   Devin Hanna is a 71 y.o. male seen today for Vickie Epley, MD for routine electrophysiology followup.  Since last being seen in our clinic the patient reports doing ***.   Has had some difficulty affording Mexitil.  Mexiletine: Pacerone 200 MG Amiodarone Digoxin Sotalol And that these meds are all a tier 1 and will cost the pt $5 a month.   he denies chest pain, palpitations, dyspnea, PND, orthopnea, nausea, vomiting, dizziness, syncope, edema, weight gain, or early satiety.  Past Medical History:  Diagnosis Date   Acute respiratory failure with hypoxia ([place]) 06/2016   CAD (coronary artery disease)    a. Remote PCI of the RCA;  b. 03/2013 Inf STEMI/PCI: LAD 60-70d, RCA 99p ISR(rota/CBA/DES);  c. 09/2014 MV: inf and infsept infarct, EF 35-40%;  d. 09/2014 Cath: patent RCA stent, stable LAD dzs, EF 40%-->Med rx.   CHF (congestive heart failure) ([place])    Diabetes mellitus    sees Dr. Hardin Negus in [place]   GERD (gastroesophageal reflux disease)    HFrEF (heart failure with reduced ejection fraction) ([place])    Hyperlipidemia    Hypertension    Ischemic cardiomyopathy    a. 09/2014 EF 40% by V gram;  b. 06/2016 Echo: EF 40, diff HK, Gr1 DD, inf HK, no mural thrombus. Mild MR, mildly dil LA, mildly dil RV.    LBBB (left bundle branch block)    Old   Liver mass    Morbid obesity ([place])    Peripheral neuropathy    Upper GI bleed    Vertigo    Past Surgical History:  Procedure Laterality Date   BIV ICD INSERTION CRT-D N/A 04/08/2020   Procedure: BIV ICD INSERTION CRT-D;  Surgeon: Vickie Epley, MD;  Location: [place] [facility];  Service: Cardiovascular;  Laterality: N/A;   CARDIAC CATHETERIZATION N/A 10/03/2014   Procedure: Left Heart Cath and Coronary Angiography;  Surgeon: [name], MD;  Location: [place] [facility];  Service: Cardiovascular;   Laterality: N/A;   CIRCUMCISION N/A 07/22/2015   Procedure: CIRCUMCISION ADULT;  Surgeon: Hollice Espy, MD;  Location: [facility] ORS;  Service: Urology;  Laterality: N/A;   COLONOSCOPY WITH PROPOFOL N/A 01/12/2019   Procedure: COLONOSCOPY WITH PROPOFOL;  Surgeon: Lucilla Lame, MD;  Location: [facility] ENDOSCOPY;  Service: Endoscopy;  Laterality: N/A;   CORONARY STENT PLACEMENT     ESOPHAGOGASTRODUODENOSCOPY (EGD) WITH PROPOFOL N/A 09/12/2014   Procedure: ESOPHAGOGASTRODUODENOSCOPY (EGD) WITH PROPOFOL;  Surgeon: Ronald Lobo, MD;  Location: [facility] ENDOSCOPY;  Service: Endoscopy;  Laterality: N/A;   EXTERNAL FIXATION LEG Left 04/03/2012   Procedure: EXTERNAL FIXATION LEG;  Surgeon: Wylene Simmer, MD;  Location: [place];  Service: Orthopedics;  Laterality: Left;   EXTERNAL FIXATION REMOVAL Left 04/14/2012   Procedure: REMOVAL EXTERNAL FIXATION LEG;  Surgeon: Wylene Simmer, MD;  Location: [place];  Service: Orthopedics;  Laterality: Left;   IR 3D INDEPENDENT WKST  10/16/2020   IR 3D INDEPENDENT WKST  10/30/2020   IR ANGIOGRAM SELECTIVE EACH ADDITIONAL VESSEL  10/16/2020   IR ANGIOGRAM SELECTIVE EACH ADDITIONAL VESSEL  10/30/2020   IR ANGIOGRAM VISCERAL SELECTIVE  10/16/2020   IR ANGIOGRAM VISCERAL SELECTIVE  10/30/2020   IR EMBO ARTERIAL NOT HEMORR HEMANG INC GUIDE ROADMAPPING  10/16/2020   IR EMBO TUMOR ORGAN ISCHEMIA INFARCT INC GUIDE ROADMAPPING  10/30/2020   IR RADIOLOGIST EVAL & MGMT  09/06/2020  IR RADIOLOGIST EVAL & MGMT  11/27/2020   IR RADIOLOGIST EVAL & MGMT  01/23/2021   IR US GUIDE VASC ACCESS RIGHT  10/16/2020   IR US GUIDE VASC ACCESS RIGHT  10/30/2020   IRRIGATION AND DEBRIDEMENT KNEE Left 04/03/2012   Procedure: IRRIGATION AND DEBRIDEMENT KNEE;  Surgeon: Wylene Simmer, MD;  Location: [place];  Service: Orthopedics;  Laterality: Left;   LEFT HEART CATHETERIZATION WITH CORONARY ANGIOGRAM N/A 04/05/2013   Procedure: LEFT HEART CATHETERIZATION WITH CORONARY ANGIOGRAM;  Surgeon: [name], MD;  Location: [place] CATH LAB;   Service: Cardiovascular;  Laterality: N/A;   MASS EXCISION N/A 07/22/2015   Procedure: EXCISION PENILE MASS;  Surgeon: Hollice Espy, MD;  Location: [facility] ORS;  Service: Urology;  Laterality: N/A;   ORIF TIBIA PLATEAU Left 04/14/2012   Procedure: OPEN REDUCTION INTERNAL FIXATION (ORIF) TIBIAL PLATEAU;  Surgeon: Wylene Simmer, MD;  Location: [place];  Service: Orthopedics;  Laterality: Left;   PERCUTANEOUS CORONARY ROTOBLATOR INTERVENTION (PCI-R) N/A 04/06/2013   Procedure: PERCUTANEOUS CORONARY ROTOBLATOR INTERVENTION (PCI-R);  Surgeon: [name], MD;  Location: [facility] CATH LAB;  Service: Cardiovascular;  Laterality: N/A;   RIGHT/LEFT HEART CATH AND CORONARY ANGIOGRAPHY N/A 08/21/2019   Procedure: RIGHT/LEFT HEART CATH AND CORONARY ANGIOGRAPHY;  Surgeon: [name], MD;  Location: [place] [facility];  Service: Cardiovascular;  Laterality: N/A;    Current Outpatient Medications  Medication Sig Dispense Refill   atorvastatin (LIPITOR) 40 MG tablet TAKE ONE TABLET BY MOUTH EVERY DAY 90 tablet 0   CINNAMON PO Take 1,000 mg by mouth in the morning and at bedtime.     ciprofloxacin (CIPRO) 500 MG tablet Take 1 tablet (500 mg total) by mouth 2 (two) times daily. 14 tablet 0   clopidogrel (PLAVIX) 75 MG tablet TAKE ONE TABLET EVERY MORNING WITH BREAKFAST 90 tablet 3   cyclobenzaprine (FLEXERIL) 5 MG tablet Take 1 tablet (5 mg total) by mouth 2 (two) times daily as needed for muscle spasms (will scause drowsiness take caution.). 30 tablet 0   dapagliflozin propanediol ([name]) 10 MG TABS tablet Take 1 tablet (10 mg total) by mouth daily before breakfast. 90 tablet 3   Dulaglutide ([name]) 3 KP/5.4SF SOPN Inject 3 mg as directed once a week. 6 mL 1   ferrous sulfate 325 (65 FE) MG tablet Take 162.5 mg by mouth daily with breakfast.     furosemide (LASIX) 40 MG tablet Take 1 tablet (40 mg total) by mouth daily. 90 tablet 1   gabapentin (NEURONTIN) 300 MG capsule Take 1 capsule (300 mg total) by  mouth every morning AND 1 capsule (300 mg total) daily with lunch AND 2 capsules (600 mg total) at bedtime. 360 capsule 3   metoprolol succinate (TOPROL-XL) 50 MG 24 hr tablet Take 1.5 tablets (75 mg total) by mouth in the morning and at bedtime. Take with or immediately following a meal. 270 tablet 3   mexiletine (MEXITIL) 150 MG capsule Take 1 capsule (150 mg total) by mouth 3 (three) times daily. 270 capsule 3   Multiple Vitamin (MULTIVITAMIN WITH MINERALS) TABS Take 1 tablet by mouth daily. Multivitamin for Men 50+     nitroGLYCERIN (NITROSTAT) 0.4 MG SL tablet Place 1 tablet (0.4 mg total) under the tongue every 5 (five) minutes as needed for chest pain. 25 tablet 0   pantoprazole (PROTONIX) 40 MG tablet TAKE 1 TABLET BY MOUTH TWICE DAILY. 180 tablet 3   polyethylene glycol powder (GLYCOLAX/MIRALAX) 17 GM/SCOOP powder Take 17 g  by mouth 2 (two) times daily as needed. 3350 g 1   potassium chloride SA (KLOR-CON M) 20 MEQ tablet Take 1 tablet (20 mEq total) by mouth daily. 90 tablet 3   sacubitril-valsartan (ENTRESTO) 24-26 MG Take 1 tablet by mouth 2 (two) times daily. (Patient taking differently: Take 0.5 tablets by mouth 2 (two) times daily.)     No current facility-administered medications for this visit.    No Known Allergies  Social History   Socioeconomic History   Marital status: Divorced    Spouse name: Not on file   Number of children: Not on file   Years of education: Not on file   Highest education level: Not on file  Occupational History   Occupation: heavy Company secretary    Employer: [employer]  Tobacco Use   Smoking status: Former    Packs/day: 3.00    Years: 40.00    Pack years: 120.00    Types: Cigarettes    Quit date: 04/05/1995    Years since quitting: 26.0   Smokeless tobacco: Former   Tobacco comments:    started smoking at age 65  Vaping Use   Vaping Use: Never used  Substance and Sexual Activity   Alcohol use: No    Comment: Occassional Use    Drug use: No   Sexual activity: Not on file  Other Topics Concern   Not on file  Social History Narrative   Heavy Company secretary.; Divorced; quit smoking 25 years; beer rare; by self. Still riding motor cycles.    Social Determinants of Health   Financial Resource Strain: Medium Risk   Difficulty of Paying Living Expenses: Somewhat hard  Food Insecurity: No Food Insecurity   Worried About Charity fundraiser in the Last Year: Never true   Ran Out of Food in the Last Year: Never true  Transportation Needs: No Transportation Needs   Lack of Transportation (Medical): No   Lack of Transportation (Non-Medical): No  Physical Activity: Not on file  Stress: No Stress Concern Present   Feeling of Stress : Not at all  Social Connections: Unknown   Frequency of Communication with Friends and Family: More than three times a week   Frequency of Social Gatherings with Friends and Family: Not on file   Attends Religious Services: Not on Electrical engineer or Organizations: Not on file   Attends Archivist Meetings: Not on file   Marital Status: Not on file  Intimate Partner Violence: Not At Risk   Fear of Current or Ex-Partner: No   Emotionally Abused: No   Physically Abused: No   Sexually Abused: No     Review of Systems: All other systems reviewed and are otherwise negative except as noted above.  Physical Exam: There were no vitals filed for this visit.  GEN- The patient is well appearing, alert and oriented x 3 today.   HEENT: normocephalic, atraumatic; sclera clear, conjunctiva pink; hearing intact; oropharynx clear; neck supple, no JVP Lymph- no cervical lymphadenopathy Lungs- Clear to ausculation bilaterally, normal work of breathing.  No wheezes, rales, rhonchi Heart- Regular rate and rhythm, no murmurs, rubs or gallops, PMI not laterally displaced GI- soft, non-tender, non-distended, bowel sounds present, no hepatosplenomegaly Extremities- no  clubbing, cyanosis, or edema; DP/PT/radial pulses 2+ bilaterally MS- no significant deformity or atrophy Skin- warm and dry, no rash or lesion Psych- euthymic mood, full affect Neuro- strength and sensation are intact  EKG is not ordered.  Personal review of EKG from today shows ***  Additional studies reviewed include: Previous EP office notes. ***  Assessment and Plan:  1 Chronic systolic heart failure s/p St Jude CRT-DD NYHA class II-III.  Warm and dry on exam.  He tells me that he feels better on an increased dose of Lasix.  I would like him to go up to 40 mg by mouth twice daily.  We will check his blood work today to make sure his electrolytes are stable.  2. Ventricular tachycardia/fibrillation With recent therapies. Mexitil was increased 02/12/2021 but is costly with [facility].  Continue Toprol  3. Permanent AF Not on [place] because of h/o bleeding which has been discussed at length.   Follow up with {Blank single:19197::"Dr. Allred","Dr. Arlan Organ. Klein","Dr. Camnitz","Dr. Lambert","EP APP"} in {Blank single:19197::"2 weeks","4 weeks","3 months","6 months","12 months","as usual post gen change"}   Shirley Friar, [place]  04/15/21 2:13 PM

## 2021-04-16 NOTE — Progress Notes (Signed)
Remote ICD transmission.   

## 2021-04-17 ENCOUNTER — Other Ambulatory Visit: Payer: Self-pay | Admitting: Cardiology

## 2021-04-17 ENCOUNTER — Ambulatory Visit: Payer: Self-pay | Admitting: Pharmacist

## 2021-04-17 NOTE — Patient Instructions (Addendum)
Hi Devin Hanna ,  ? ?I am being asked to quickly transition into another role within the health system, so unfortunately I am unable to keep our next appointment. Please continue to follow up with your primary care provider as scheduled.  ? ?As a reminder-  ? ?The [employer] Patient Assistance Program (for [name]) has an auto-refill program where you do not need to call and request a refill each time. You should receive your medication shipment before you run out of your medication. However, if you have any issues or need assistance, you can call them at [phone]. They are available Monday though Friday 8:00 AM - 5:00 PM.  ? ?The [name] prescription savings program (for Wilder Glade) has an auto-refill program where you do not need to call and request a refill each time. You should receive your medication shipment before you run out of your medication. However, if you have any issues or need assistance, you can call them call [phone]. They are available Monday though Friday 9:00 AM - 6:00 PM.  ? ?For prescription refills for Entresto through the [employer], call [phone]. Once you have been enrolled in the program, your prescriptions can easily be refilled by contacting the phone number above Monday though Friday 8:00 AM - 8:00 PM. ? ?I'm going to have my scheduler call you to discuss scheduling follow up with our clinic nurse case manager, Sheppard Evens, instead of me for chronic follow up. ? ? ?It has been a pleasure working with you! ? ?[name], [name] ? ?

## 2021-04-17 NOTE — Chronic Care Management (AMB) (Signed)
?  Chronic Care Management  ? ?Note ? ?04/17/2021 ?Name: ESTEL SCHOLZE MRN: [id number] DOB: Nov 14, 1950 ? ? ? ?Closing pharmacy CCM case at this time. Will collaborate with Care Guide to outreach to schedule follow up with RN CM. Patient has clinic contact information for future questions or concerns.  ? ?[name], [name], [place], CPP ?Clinical Pharmacist ?Therapist, music at [employer] ?[phone] ? ?

## 2021-04-24 ENCOUNTER — Ambulatory Visit: Payer: PPO | Admitting: Student

## 2021-04-24 ENCOUNTER — Other Ambulatory Visit: Payer: Self-pay

## 2021-04-24 ENCOUNTER — Encounter: Payer: Self-pay | Admitting: Student

## 2021-04-24 VITALS — BP 120/66 | HR 70 | Ht 65.0 in | Wt 220.0 lb

## 2021-04-24 DIAGNOSIS — I4891 Unspecified atrial fibrillation: Secondary | ICD-10-CM | POA: Diagnosis not present

## 2021-04-24 DIAGNOSIS — I5022 Chronic systolic (congestive) heart failure: Secondary | ICD-10-CM | POA: Diagnosis not present

## 2021-04-24 DIAGNOSIS — I472 Ventricular tachycardia, unspecified: Secondary | ICD-10-CM

## 2021-04-24 LAB — CUP PACEART INCLINIC DEVICE CHECK
Battery Remaining Longevity: 69 mo
Brady Statistic RA Percent Paced: 0.33 %
Brady Statistic RV Percent Paced: 98 %
Date Time Interrogation Session: 20230316115843
HighPow Impedance: 78.75 Ohm
Implantable Lead Implant Date: 20220228
Implantable Lead Implant Date: 20220228
Implantable Lead Implant Date: 20220228
Implantable Lead Location: 753858
Implantable Lead Location: 753859
Implantable Lead Location: 753860
Implantable Pulse Generator Implant Date: 20220228
Lead Channel Impedance Value: 1175 Ohm
Lead Channel Impedance Value: 475 Ohm
Lead Channel Impedance Value: 575 Ohm
Lead Channel Pacing Threshold Amplitude: 0.75 V
Lead Channel Pacing Threshold Amplitude: 0.75 V
Lead Channel Pacing Threshold Amplitude: 2 V
Lead Channel Pacing Threshold Pulse Width: 0.5 ms
Lead Channel Pacing Threshold Pulse Width: 0.5 ms
Lead Channel Pacing Threshold Pulse Width: 0.8 ms
Lead Channel Sensing Intrinsic Amplitude: 12 mV
Lead Channel Sensing Intrinsic Amplitude: 2.9 mV
Lead Channel Setting Pacing Amplitude: 2.5 V
Lead Channel Setting Pacing Amplitude: 2.5 V
Lead Channel Setting Pacing Amplitude: 2.5 V
Lead Channel Setting Pacing Pulse Width: 0.5 ms
Lead Channel Setting Pacing Pulse Width: 0.8 ms
Lead Channel Setting Sensing Sensitivity: 0.5 mV
Pulse Gen Serial Number: 810018869

## 2021-04-24 MED ORDER — METOPROLOL SUCCINATE ER 50 MG PO TB24: ORAL_TABLET | ORAL | 3 refills | Status: AC

## 2021-04-24 MED ORDER — MEXILETINE HCL 150 MG PO CAPS: 150.0000 mg | ORAL_CAPSULE | Freq: Three times a day (TID) | ORAL | 3 refills | Status: AC

## 2021-04-24 NOTE — Patient Instructions (Signed)
Medication Instructions:  ?Your physician recommends that you continue on your current medications as directed. Please refer to the Current Medication list given to you today. ? ?*If you need a refill on your cardiac medications before your next appointment, please call your pharmacy* ? ? ?Lab Work: ?None ?If you have labs (blood work) drawn today and your tests are completely normal, you will receive your results only by: ?MyChart Message (if you have MyChart) OR ?A paper copy in the mail ?If you have any lab test that is abnormal or we need to change your treatment, we will call you to review the results. ? ? ?Follow-Up: ?At [facility], you and your health needs are our priority.  As part of our continuing mission to provide you with exceptional heart care, we have created designated Provider Care Teams.  These Care Teams include your primary Cardiologist (physician) and Advanced Practice Providers (APPs -  Physician Assistants and Nurse Practitioners) who all work together to provide you with the care you need, when you need it. ? ? ?Your next appointment:   ?6 month(s) ? ?The format for your next appointment:   ?In Person ? ?Provider:   ?Lars Mage, MD{ ?

## 2021-04-29 ENCOUNTER — Other Ambulatory Visit: Payer: Self-pay

## 2021-04-29 ENCOUNTER — Other Ambulatory Visit (INDEPENDENT_AMBULATORY_CARE_PROVIDER_SITE_OTHER): Payer: PPO

## 2021-04-29 DIAGNOSIS — R7989 Other specified abnormal findings of blood chemistry: Secondary | ICD-10-CM | POA: Diagnosis not present

## 2021-04-29 LAB — HEPATIC FUNCTION PANEL
ALT: 63 U/L — ABNORMAL HIGH (ref 0–53)
AST: 114 U/L — ABNORMAL HIGH (ref 0–37)
Albumin: 3.3 g/dL — ABNORMAL LOW (ref 3.5–5.2)
Alkaline Phosphatase: 336 U/L — ABNORMAL HIGH (ref 39–117)
Bilirubin, Direct: 0.4 mg/dL — ABNORMAL HIGH (ref 0.0–0.3)
Total Bilirubin: 1.5 mg/dL — ABNORMAL HIGH (ref 0.2–1.2)
Total Protein: 6.7 g/dL (ref 6.0–8.3)

## 2021-05-02 ENCOUNTER — Telehealth: Payer: Self-pay | Admitting: Family Medicine

## 2021-05-02 ENCOUNTER — Other Ambulatory Visit: Payer: Self-pay

## 2021-05-02 DIAGNOSIS — I5022 Chronic systolic (congestive) heart failure: Secondary | ICD-10-CM

## 2021-05-02 DIAGNOSIS — I4891 Unspecified atrial fibrillation: Secondary | ICD-10-CM

## 2021-05-02 LAB — BASIC METABOLIC PANEL

## 2021-05-02 NOTE — Telephone Encounter (Signed)
LVM for patient's daughter to call back.  Ethon Wymer,cma  ?

## 2021-05-02 NOTE — Telephone Encounter (Signed)
-----   Message from Suzette Battiest, MD sent at 05/02/2021  2:31 PM EDT ----- ?Regarding: RE: question about liver enzymes ?Interesting that these are still elevated, thanks for letting me know.  It could certainly be from procedure, though not expected.  I think best next steps is seeing how his upcoming MR looks with continued trending of his labs.  Regardless, continued followup with GI would be recommended as he has cirrhosis with [place]. ? ?Thank you, ? ?Dylan ?----- Message ----- ?From: [name], MD ?Sent: 05/02/2021   2:10 PM EDT ?To: Suzette Battiest, MD ?Subject: question about liver enzymes                  ? ?Dr Serafina Royals, I ended up getting another set of enzymes on him as previously suggested. They are generally similar. Could this be related to the intervention he underwent with you? If so, what would you recommend for further evaluation? If not I will get him in to see GI for evaluation of this. Thanks.   ? ? ?

## 2021-05-02 NOTE — Telephone Encounter (Signed)
Please let the patient or his daughter know that I heard back from Dr. Serafina Royals.  He recommended continued trending of his liver enzymes and seeing what his upcoming MRI looks like.  He did recommend having the patient see GI as well.  It looks like the patient does not have an appointment with GI.  Would they like [place] to refer him back to GI for we will the patient's daughter call to schedule this?  I think it may also be a good idea to have him stop the Lipitor to see if that is contributing as well.  I would plan to recheck his hepatic function panel in a few weeks. ?

## 2021-05-05 ENCOUNTER — Other Ambulatory Visit: Payer: PPO | Admitting: *Deleted

## 2021-05-05 ENCOUNTER — Other Ambulatory Visit: Payer: Self-pay

## 2021-05-05 ENCOUNTER — Other Ambulatory Visit: Payer: Self-pay | Admitting: Student

## 2021-05-05 DIAGNOSIS — I5022 Chronic systolic (congestive) heart failure: Secondary | ICD-10-CM | POA: Diagnosis not present

## 2021-05-05 DIAGNOSIS — I4891 Unspecified atrial fibrillation: Secondary | ICD-10-CM

## 2021-05-06 DIAGNOSIS — R7989 Other specified abnormal findings of blood chemistry: Secondary | ICD-10-CM

## 2021-05-06 LAB — BASIC METABOLIC PANEL
BUN/Creatinine Ratio: 9 — ABNORMAL LOW (ref 10–24)
BUN: 12 mg/dL (ref 8–27)
CO2: 24 mmol/L (ref 20–29)
Calcium: 8.8 mg/dL (ref 8.6–10.2)
Chloride: 98 mmol/L (ref 96–106)
Creatinine, Ser: 1.27 mg/dL (ref 0.76–1.27)
Glucose: 91 mg/dL (ref 70–99)
Potassium: 4.7 mmol/L (ref 3.5–5.2)
Sodium: 136 mmol/L (ref 134–144)
eGFR: 60 mL/min/{1.73_m2} (ref >59)

## 2021-05-06 NOTE — Telephone Encounter (Signed)
Message was sent to mychart for the patient and his daughter after attempts to reach by phone.  Danika Kluender,cma  ?

## 2021-05-09 ENCOUNTER — Encounter: Payer: Self-pay | Admitting: Cardiology

## 2021-05-09 NOTE — Telephone Encounter (Signed)
Pt daughter returning call... Advise Pt daughter of note below... Pt daughter understood... Pt daughter requesting for referral to GI.Marland KitchenMarland Kitchen  ?

## 2021-05-14 NOTE — Telephone Encounter (Signed)
Noted. Referral placed. Pleas make sure they know to stop the lipitor as well given his elevated LFTs. Thanks.  ?

## 2021-05-20 ENCOUNTER — Other Ambulatory Visit: Payer: Self-pay | Admitting: Nurse Practitioner

## 2021-05-26 ENCOUNTER — Ambulatory Visit: Payer: PPO | Admitting: Family Medicine

## 2021-05-27 DIAGNOSIS — C220 Liver cell carcinoma: Secondary | ICD-10-CM | POA: Diagnosis not present

## 2021-05-28 ENCOUNTER — Ambulatory Visit (INDEPENDENT_AMBULATORY_CARE_PROVIDER_SITE_OTHER): Payer: PPO

## 2021-05-28 ENCOUNTER — Encounter: Payer: Self-pay | Admitting: Family

## 2021-05-28 ENCOUNTER — Ambulatory Visit (INDEPENDENT_AMBULATORY_CARE_PROVIDER_SITE_OTHER): Payer: PPO | Admitting: Family

## 2021-05-28 VITALS — BP 122/78 | HR 67 | Temp 97.9°F | Ht 67.0 in | Wt 218.1 lb

## 2021-05-28 DIAGNOSIS — G8929 Other chronic pain: Secondary | ICD-10-CM

## 2021-05-28 DIAGNOSIS — M545 Low back pain, unspecified: Secondary | ICD-10-CM

## 2021-05-28 DIAGNOSIS — M5134 Other intervertebral disc degeneration, thoracic region: Secondary | ICD-10-CM | POA: Diagnosis not present

## 2021-05-28 LAB — CBC
HCT: 49.6 % (ref 38.5–50.0)
Hemoglobin: 16.6 g/dL (ref 13.2–17.1)
MCH: 33.2 pg — ABNORMAL HIGH (ref 27.0–33.0)
MCHC: 33.5 g/dL (ref 32.0–36.0)
MCV: 99.2 fL (ref 80.0–100.0)
MPV: 9.3 fL (ref 7.5–12.5)
Platelets: 238 10*3/uL (ref 140–400)
RBC: 5 10*6/uL (ref 4.20–5.80)
RDW: 12.2 % (ref 11.0–15.0)
WBC: 7.8 10*3/uL (ref 3.8–10.8)

## 2021-05-28 LAB — COMPLETE METABOLIC PANEL WITH GFR
AG Ratio: 0.9 (calc) — ABNORMAL LOW (ref 1.0–2.5)
ALT: 44 U/L (ref 9–46)
AST: 65 U/L — ABNORMAL HIGH (ref 10–35)
Albumin: 3.1 g/dL — ABNORMAL LOW (ref 3.6–5.1)
Alkaline phosphatase (APISO): 402 U/L — ABNORMAL HIGH (ref 35–144)
BUN: 14 mg/dL (ref 7–25)
CO2: 27 mmol/L (ref 20–32)
Calcium: 8.8 mg/dL (ref 8.6–10.3)
Chloride: 103 mmol/L (ref 98–110)
Creat: 1.11 mg/dL (ref 0.70–1.28)
Globulin: 3.5 g/dL (calc) (ref 1.9–3.7)
Glucose, Bld: 98 mg/dL (ref 65–99)
Potassium: 4.7 mmol/L (ref 3.5–5.3)
Sodium: 139 mmol/L (ref 135–146)
Total Bilirubin: 1.6 mg/dL — ABNORMAL HIGH (ref 0.2–1.2)
Total Protein: 6.6 g/dL (ref 6.1–8.1)
eGFR: 70 mL/min/{1.73_m2} (ref >=60)

## 2021-05-28 LAB — AFP TUMOR MARKER: AFP-Tumor Marker: 2631 ng/mL — ABNORMAL HIGH (ref <6.1)

## 2021-05-28 LAB — PROTIME-INR
INR: 1.2 — ABNORMAL HIGH
Prothrombin Time: 11.7 s — ABNORMAL HIGH (ref 9.0–11.5)

## 2021-05-28 MED ORDER — TRAMADOL HCL 50 MG PO TABS: 50.0000 mg | ORAL_TABLET | Freq: Every day | ORAL | 1 refills | Status: DC | PRN

## 2021-05-28 NOTE — Assessment & Plan Note (Addendum)
Uncontrolled. Acute on chronic LBP. Known compression fracture. No radicular symptoms. H/o cirrhosis and on antiplatelet medication limiting options for pain control including acetiminophen, nsaids.  ?Trial of tramadol. Counseled on risk of sedation and importance of storing in safe place. Controlled substance contract signed. No h/o seizure. Pending lumbar and thoracic xray, referral to Dr Sharlet Salina whom he has seen in the past.  ?Close follow up ?I looked up patient on [place] Controlled Substances Reporting System PMP AWARE and saw no activity that raised concern of inappropriate use.  ? ?

## 2021-05-28 NOTE — Progress Notes (Signed)
? ?Subjective:  ? ? Patient ID: Devin Hanna, male    DOB: 1950/12/11, 71 y.o.   MRN: [id number] ? ?CC: Devin Hanna is a 71 y.o. male who presents today for an acute visit.   ? ?HPI: He has chronic low back pain, waxes and wanes.  ?Accompanied by daughter ? ?Low back pain has never has completely gone away since Fall 4 months ago directly onto low back.  ? One week ago midline low back pain recurred.  Describes 'low grade pain', 4/10 when sitting but escalates to 10/10 when goes to stand. LBP usually eases off when sleeping. He had to do a lot of walking due to getting labs into and out of hospital.  ?Pain is not radiating.  ? ?No recent fall since 02/09/21. No known injury.  ? ?No numbness or weakness in legs.  ?No abdominal pain, fever, dysuria, trouble urinating, constipation ? ? ?DM-FBG 130-140 ?No h/o seizure ? ?Previously saw Dr Leanord Hawking and had epidural injection 09/07/2017.  ?He is not using flexeril that he knows of. ? ?Peripheral neuropathy- He is compliant with gabapentin 312m qam, 3086mat lunch and 6007mt bedtime without much relief. Doesn't feel too sedated.  ? ?Recently stopped lipitor 05/02/21 due elevated hepatic enzymes ? ?History of coronary artery disease, chronic systolic heart failure, hypertension, atrial fibrillation, hepatocellular carcinoma, cirrhosis, diabetes, neuropathy ? ?Recently seen by orthopedic ThoDorise Hissr left knee osteoarthritis ? 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 ?He is unable to undergo MRI due to pacemaker/debrillator;  Follows Dr LamQuentin Oreiscussed possibility of kyphoplasty procedure if pain became severe and debilitating.  Discussed possible total knee arthroplasty ?Scheduled MRI abdomen 05/30/21 and pacemaker will be turned off per patient during test.  ? ?HISTORY:  ?Past Medical History:  ?Diagnosis Date  ? Acute  respiratory failure with hypoxia ([facility]CC[place]5/2018  ? CAD (coronary artery disease)   ? a. Remote PCI of the RCA;  b. 03/2013 Inf STEMI/PCI: LAD 60-70d, RCA 99p ISR(rota/CBA/DES);  c. 09/2014 MV: inf and infsept infarct, EF 35-40%;  d. 09/2014 Cath: patent RCA stent, stable LAD dzs, EF 40%-->Med rx.  ? CHF (congestive heart failure) ([place] ? Diabetes mellitus   ? [place] Dr. [place] GERD (gastroesophageal reflux disease)   ? HFrEF (heart failure with reduced ejection fraction) ([place] ? Hyperlipidemia   ? Hypertension   ? Ischemic cardiomyopathy   ? a. 09/2014 EF 40% by V gram;  b. 06/2016 Echo: EF 40, diff HK, Gr1 DD, inf HK, no mural thrombus. Mild MR, mildly dil LA, mildly dil RV.   ? LBBB (left bundle branch block)   ? Old  ? Liver mass   ? Morbid obesity ([place] ? Peripheral neuropathy   ? Upper GI bleed   ? Vertigo   ? ?Past Surgical History:  ?Procedure Laterality Date  ? BIV ICD INSERTION CRT-D N/A 04/08/2020  ? Procedure: BIV ICD INSERTION CRT-D;  Surgeon: LamVickie EpleyD;  Location: [facility];  Service: Cardiovascular;  Laterality: N/A;  ? CARDIAC CATHETERIZATION N/A 10/03/2014  ? Procedure: Left Heart Cath and Coronary Angiography;  Surgeon: MuhWellington HampshireD;  Location: [facility];  Service: Cardiovascular;  Laterality: N/A;  ? CIRCUMCISION N/A 07/22/2015  ? Procedure: CIRCUMCISION ADULT;  Surgeon: AshHollice EspyD;  [facility];  Service: Urology;  Laterality: N/A;  ? COLONOSCOPY WITH PROPOFOL N/A 01/12/2019  ? Procedure: COLONOSCOPY WITH PROPOFOL;  Surgeon: Lucilla Lame, MD;  Location: [facility] ENDOSCOPY;  Service: Endoscopy;  Laterality: N/A;  ? CORONARY STENT PLACEMENT    ? ESOPHAGOGASTRODUODENOSCOPY (EGD) WITH PROPOFOL N/A 09/12/2014  ? Procedure: ESOPHAGOGASTRODUODENOSCOPY (EGD) WITH PROPOFOL;  Surgeon: Ronald Lobo, MD;  Location: [facility] ENDOSCOPY;  Service: Endoscopy;  Laterality: N/A;  ? EXTERNAL FIXATION LEG Left 04/03/2012  ? Procedure: EXTERNAL FIXATION LEG;   Surgeon: Wylene Simmer, MD;  Location: [place];  Service: Orthopedics;  Laterality: Left;  ? EXTERNAL FIXATION REMOVAL Left 04/14/2012  ? Procedure: REMOVAL EXTERNAL FIXATION LEG;  Surgeon: Wylene Simmer, MD;  Location: [place];  Service: Orthopedics;  Laterality: Left;  ? IR 3D INDEPENDENT WKST  10/16/2020  ? IR 3D INDEPENDENT WKST  10/30/2020  ? IR ANGIOGRAM SELECTIVE EACH ADDITIONAL VESSEL  10/16/2020  ? IR ANGIOGRAM SELECTIVE EACH ADDITIONAL VESSEL  10/30/2020  ? IR ANGIOGRAM VISCERAL SELECTIVE  10/16/2020  ? IR ANGIOGRAM VISCERAL SELECTIVE  10/30/2020  ? IR EMBO ARTERIAL NOT HEMORR HEMANG INC GUIDE ROADMAPPING  10/16/2020  ? IR EMBO TUMOR ORGAN ISCHEMIA INFARCT INC GUIDE ROADMAPPING  10/30/2020  ? IR RADIOLOGIST EVAL & MGMT  09/06/2020  ? IR RADIOLOGIST EVAL & MGMT  11/27/2020  ? IR RADIOLOGIST EVAL & MGMT  01/23/2021  ? IR US GUIDE VASC ACCESS RIGHT  10/16/2020  ? IR US GUIDE VASC ACCESS RIGHT  10/30/2020  ? IRRIGATION AND DEBRIDEMENT KNEE Left 04/03/2012  ? Procedure: IRRIGATION AND DEBRIDEMENT KNEE;  Surgeon: Wylene Simmer, MD;  Location: [place];  Service: Orthopedics;  Laterality: Left;  ? LEFT HEART CATHETERIZATION WITH CORONARY ANGIOGRAM N/A 04/05/2013  ? Procedure: LEFT HEART CATHETERIZATION WITH CORONARY ANGIOGRAM;  Surgeon: [name], MD;  Location: [place] CATH LAB;  Service: Cardiovascular;  Laterality: N/A;  ? MASS EXCISION N/A 07/22/2015  ? Procedure: EXCISION PENILE MASS;  Surgeon: Hollice Espy, MD;  Location: [facility] ORS;  Service: Urology;  Laterality: N/A;  ? ORIF TIBIA PLATEAU Left 04/14/2012  ? Procedure: OPEN REDUCTION INTERNAL FIXATION (ORIF) TIBIAL PLATEAU;  Surgeon: Wylene Simmer, MD;  Location: [place];  Service: Orthopedics;  Laterality: Left;  ? PERCUTANEOUS CORONARY ROTOBLATOR INTERVENTION (PCI-R) N/A 04/06/2013  ? Procedure: PERCUTANEOUS CORONARY ROTOBLATOR INTERVENTION (PCI-R);  Surgeon: [name], MD;  Location[facility] CATH LAB;  Service: Cardiovascular;  Laterality: N/A;  ? RIGHT/LEFT HEART CATH AND CORONARY  ANGIOGRAPHY N/A 08/21/2019  ? Procedure: RIGHT/LEFT HEART CATH AND CORONARY ANGIOGRAPHY;  Surgeon: [name], MD;  Location: [place] CV LAB;  Service: Cardiovascular;  Laterality: N/A;  ? ?Family History  ?Problem Relation Age of Onset  ? Coronary artery disease Father   ? Coronary artery disease Brother   ? Lung cancer Brother   ? Breast cancer Sister   ? Prostate cancer Brother   ? Stroke Sister   ? ? ?Allergies: Patient has no known allergies. ?Current Outpatient Medications on File Prior to Visit  ?Medication Sig Dispense Refill  ? CINNAMON PO Take 1,000 mg by mouth in the morning and at bedtime.    ? clopidogrel (PLAVIX) 75 MG tablet TAKE ONE TABLET EVERY MORNING WITH BREAKFAST 90 tablet 3  ? cyclobenzaprine (FLEXERIL) 5 MG tablet Take 1 tablet (5 mg total) by mouth 2 (two) times daily as needed for muscle spasms (will scause drowsiness take caution.). 30 tablet 0  ? dapagliflozin propanediol ([name]) 10 MG TABS tablet Take 1 tablet (10  mg total) by mouth daily before breakfast. 90 tablet 3  ? Dulaglutide ([name]) 3 JS/3.1RX SOPN Inject 3 mg as directed once a week. 6 mL 1  ? ferrous sulfate 325 (65 FE) MG tablet Take 162.5 mg by mouth daily with breakfast.    ? furosemide (LASIX) 40 MG tablet Take 1 tablet (40 mg total) by mouth daily. 90 tablet 1  ? gabapentin (NEURONTIN) 300 MG capsule Take 1 capsule (300 mg total) by mouth every morning AND 1 capsule (300 mg total) daily with lunch AND 2 capsules (600 mg total) at bedtime. 360 capsule 3  ? metoprolol succinate (TOPROL-XL) 50 MG 24 hr tablet TAKE 1.5 TABLETS BY MOUTH IN THE MORNINGAND AT BEDTIME. TAKE WITH OR IMMEDIATELYFOLLOWING A MEAL. 270 tablet 3  ? mexiletine (MEXITIL) 150 MG capsule Take 1 capsule (150 mg total) by mouth 3 (three) times daily. 270 capsule 3  ? Multiple Vitamin (MULTIVITAMIN WITH MINERALS) TABS Take 1 tablet by mouth daily. Multivitamin for Men 50+    ? pantoprazole (PROTONIX) 40 MG tablet TAKE 1 TABLET BY MOUTH TWICE  DAILY. 180 tablet 3  ? polyethylene glycol powder (GLYCOLAX/MIRALAX) 17 GM/SCOOP powder Take 17 g by mouth 2 (two) times daily as needed. 3350 g 1  ? potassium chloride SA (KLOR-CON M) 20 MEQ tablet Take 1 tablet (20

## 2021-05-28 NOTE — Patient Instructions (Signed)
Referral to Dr. Leanord Hawking, physiatry.  I have ordered bone density scan ?Let us know if you dont hear back within a week in regards to an appointment being scheduled.  ? ?Start tramadol 50 mg once per day.  As discussed, this is an opioid like medication and it can cause drowsiness.  Please store in a safe place.  Please let me know how you are doing and call with any questions at all ? ?

## 2021-05-30 ENCOUNTER — Ambulatory Visit (HOSPITAL_COMMUNITY): Payer: PPO

## 2021-06-02 ENCOUNTER — Other Ambulatory Visit: Payer: PPO

## 2021-06-03 NOTE — Addendum Note (Signed)
Addended by: [name] on: 06/03/2021 08:41 AM ? ? Modules accepted: Orders ? ?

## 2021-06-09 ENCOUNTER — Other Ambulatory Visit (INDEPENDENT_AMBULATORY_CARE_PROVIDER_SITE_OTHER): Payer: PPO

## 2021-06-09 DIAGNOSIS — R7989 Other specified abnormal findings of blood chemistry: Secondary | ICD-10-CM | POA: Diagnosis not present

## 2021-06-09 LAB — HEPATIC FUNCTION PANEL
ALT: 36 U/L (ref 0–53)
AST: 54 U/L — ABNORMAL HIGH (ref 0–37)
Albumin: 3 g/dL — ABNORMAL LOW (ref 3.5–5.2)
Alkaline Phosphatase: 451 U/L — ABNORMAL HIGH (ref 39–117)
Bilirubin, Direct: 0.7 mg/dL — ABNORMAL HIGH (ref 0.0–0.3)
Total Bilirubin: 1.7 mg/dL — ABNORMAL HIGH (ref 0.2–1.2)
Total Protein: 6.5 g/dL (ref 6.0–8.3)

## 2021-06-10 ENCOUNTER — Telehealth: Payer: Self-pay | Admitting: Family Medicine

## 2021-06-10 NOTE — Telephone Encounter (Signed)
Patient's daughter [name] called. Lab results read. [name] would like to know if Dr [name] still wants patient to stay off his Lipitor. ?

## 2021-06-11 ENCOUNTER — Other Ambulatory Visit: Payer: Self-pay | Admitting: Family Medicine

## 2021-06-11 DIAGNOSIS — M5136 Other intervertebral disc degeneration, lumbar region: Secondary | ICD-10-CM | POA: Diagnosis not present

## 2021-06-11 DIAGNOSIS — M5416 Radiculopathy, lumbar region: Secondary | ICD-10-CM

## 2021-06-11 DIAGNOSIS — S32010A Wedge compression fracture of first lumbar vertebra, initial encounter for closed fracture: Secondary | ICD-10-CM | POA: Diagnosis not present

## 2021-06-11 DIAGNOSIS — M4807 Spinal stenosis, lumbosacral region: Secondary | ICD-10-CM | POA: Diagnosis not present

## 2021-06-11 NOTE — Telephone Encounter (Signed)
For now I would like him to remain off the Lipitor as it seems that may have been contributing to his elevated liver enzymes.  We will see what GI says when he follows up with them. ?

## 2021-06-13 ENCOUNTER — Ambulatory Visit: Payer: PPO | Admitting: Family Medicine

## 2021-06-18 ENCOUNTER — Telehealth: Payer: Self-pay | Admitting: Family Medicine

## 2021-06-18 ENCOUNTER — Ambulatory Visit
Admission: RE | Admit: 2021-06-18 | Discharge: 2021-06-18 | Disposition: A | Discharge status: Home / Self Care | Payer: PPO | Source: Ambulatory Visit | Attending: Family Medicine | Admitting: Family Medicine

## 2021-06-18 DIAGNOSIS — M5416 Radiculopathy, lumbar region: Secondary | ICD-10-CM | POA: Diagnosis not present

## 2021-06-18 DIAGNOSIS — S32010A Wedge compression fracture of first lumbar vertebra, initial encounter for closed fracture: Secondary | ICD-10-CM | POA: Diagnosis not present

## 2021-06-18 DIAGNOSIS — M545 Low back pain, unspecified: Secondary | ICD-10-CM | POA: Diagnosis not present

## 2021-06-18 DIAGNOSIS — G8929 Other chronic pain: Secondary | ICD-10-CM

## 2021-06-18 NOTE — Telephone Encounter (Signed)
Pt daughter called stating medication-tramadol is not working for pt. Pt is still having back pain  ?

## 2021-06-19 NOTE — Telephone Encounter (Signed)
Patients daughter called back and I informed her to increase his Tramadol from 50 mg per day to 50 mg 2 times daily instead per Joycelyn Schmid. She was agreeable to that, but also wanted to know if we were sending in another prescription as well???  Mr. Jamarea has an appointment with Dr. [name] on 06/24/21. ?

## 2021-06-19 NOTE — Telephone Encounter (Signed)
LMTCB to office ?

## 2021-06-20 MED ORDER — TRAMADOL HCL 50 MG PO TABS: 50.0000 mg | ORAL_TABLET | Freq: Two times a day (BID) | ORAL | 1 refills | Status: AC

## 2021-06-20 NOTE — Telephone Encounter (Signed)
Call daughter and let her know that a new prescription has been sent. ? ?I looked up patient on [place] Controlled Substances Reporting System PMP AWARE and saw no activity that raised concern of inappropriate use.  ? ?

## 2021-06-20 NOTE — Addendum Note (Signed)
Addended by: Burnard Hawthorne on: 06/20/2021 06:20 AM ? ? Modules accepted: Orders ? ?

## 2021-06-20 NOTE — Telephone Encounter (Signed)
Spoke to daughter and informed her that Devin Hanna new prescription has been sent in to pharmacy. ?

## 2021-06-24 ENCOUNTER — Telehealth: Payer: Self-pay

## 2021-06-24 ENCOUNTER — Encounter: Payer: Self-pay | Admitting: Family Medicine

## 2021-06-24 ENCOUNTER — Ambulatory Visit (INDEPENDENT_AMBULATORY_CARE_PROVIDER_SITE_OTHER): Payer: PPO | Admitting: Family Medicine

## 2021-06-24 VITALS — BP 120/70 | HR 70 | Temp 98.0°F | Ht 65.5 in | Wt 213.6 lb

## 2021-06-24 DIAGNOSIS — M4646 Discitis, unspecified, lumbar region: Secondary | ICD-10-CM | POA: Diagnosis not present

## 2021-06-24 DIAGNOSIS — M545 Low back pain, unspecified: Secondary | ICD-10-CM

## 2021-06-24 DIAGNOSIS — I25118 Atherosclerotic heart disease of native coronary artery with other forms of angina pectoris: Secondary | ICD-10-CM

## 2021-06-24 MED ORDER — HYDROCODONE-ACETAMINOPHEN 5-325 MG PO TABS: 1.0000 | ORAL_TABLET | Freq: Four times a day (QID) | ORAL | 0 refills | Status: DC | PRN

## 2021-06-24 NOTE — Progress Notes (Signed)
?[name], MD ?Phone: [phone] ? ?Devin Hanna is a 71 y.o. male who presents today for follow-up. ? ?Low back pain: This has been an ongoing issue since January.  He had a fall when his defibrillator went off and since then has had low back pain.  He has seen physical medicine and rehab and had a CT scan that revealed moderate to severe L1 compression fracture and a mild fracture at L2.  It also revealed erosion of the L5 inferior endplate and question the possibility of infection though mentioned that this would be less likely given the appearance.  He has had no fevers or chills.  He notes no incontinence, numbness, or weakness.  He is scheduled for an epidural next week.  He notes tramadol has not been helping much with his pain.  He reports that the worst his pain is an 8-9 out of 10.  His daughter wonders about a back brace. ? ?Social History  ? ?Tobacco Use  ?Smoking Status Former  ? Packs/day: 3.00  ? Years: 40.00  ? Pack years: 120.00  ? Types: Cigarettes  ? Quit date: 04/05/1995  ? Years since quitting: 26.2  ?Smokeless Tobacco Former  ?Tobacco Comments  ? started smoking at age 64  ? ? ?Current Outpatient Medications on File Prior to Visit  ?Medication Sig Dispense Refill  ? CINNAMON PO Take 1,000 mg by mouth in the morning and at bedtime.    ? clopidogrel (PLAVIX) 75 MG tablet TAKE ONE TABLET EVERY MORNING WITH BREAKFAST 90 tablet 3  ? cyclobenzaprine (FLEXERIL) 5 MG tablet Take 1 tablet (5 mg total) by mouth 2 (two) times daily as needed for muscle spasms (will scause drowsiness take caution.). 30 tablet 0  ? dapagliflozin propanediol ([name]) 10 MG TABS tablet Take 1 tablet (10 mg total) by mouth daily before breakfast. 90 tablet 3  ? Dulaglutide ([name]) 3 OP/9.2TW SOPN Inject 3 mg as directed once a week. 6 mL 1  ? ferrous sulfate 325 (65 FE) MG tablet Take 162.5 mg by mouth daily with breakfast.    ? furosemide (LASIX) 40 MG tablet Take 1 tablet (40 mg total) by mouth daily. 90  tablet 1  ? gabapentin (NEURONTIN) 300 MG capsule Take 1 capsule (300 mg total) by mouth every morning AND 1 capsule (300 mg total) daily with lunch AND 2 capsules (600 mg total) at bedtime. 360 capsule 3  ? metoprolol succinate (TOPROL-XL) 50 MG 24 hr tablet TAKE 1.5 TABLETS BY MOUTH IN THE MORNINGAND AT BEDTIME. TAKE WITH OR IMMEDIATELYFOLLOWING A MEAL. 270 tablet 3  ? mexiletine (MEXITIL) 150 MG capsule Take 1 capsule (150 mg total) by mouth 3 (three) times daily. 270 capsule 3  ? Multiple Vitamin (MULTIVITAMIN WITH MINERALS) TABS Take 1 tablet by mouth daily. Multivitamin for Men 50+    ? nitroGLYCERIN (NITROSTAT) 0.4 MG SL tablet Place 1 tablet (0.4 mg total) under the tongue every 5 (five) minutes as needed for chest pain. 25 tablet 0  ? pantoprazole (PROTONIX) 40 MG tablet TAKE 1 TABLET BY MOUTH TWICE DAILY. 180 tablet 3  ? polyethylene glycol powder (GLYCOLAX/MIRALAX) 17 GM/SCOOP powder Take 17 g by mouth 2 (two) times daily as needed. 3350 g 1  ? potassium chloride SA (KLOR-CON M) 20 MEQ tablet Take 1 tablet (20 mEq total) by mouth daily. 90 tablet 3  ? sacubitril-valsartan (ENTRESTO) 24-26 MG Take 1 tablet by mouth 2 (two) times daily. (Patient taking differently: Take 0.5 tablets by mouth 2 (  two) times daily.)    ? traMADol (ULTRAM) 50 MG tablet Take 1 tablet (50 mg total) by mouth 2 (two) times daily. 60 tablet 1  ? ?No current facility-administered medications on file prior to visit.  ? ? ? ?ROS see history of present illness ? ?Objective ? ?Physical Exam ?Vitals:  ? 06/24/21 1150  ?BP: 120/70  ?Pulse: 70  ?Temp: 98 ?F (36.7 ?C)  ?SpO2: 96%  ? ? ?BP Readings from Last 3 Encounters:  ?06/24/21 120/70  ?05/28/21 122/78  ?04/24/21 120/66  ? ?Wt Readings from Last 3 Encounters:  ?06/24/21 213 lb 9.6 oz (96.9 kg)  ?05/28/21 218 lb 1.6 oz (98.9 kg)  ?04/24/21 220 lb (99.8 kg)  ? ? ?Physical Exam ?Constitutional:   ?   General: He is not in acute distress. ?   Appearance: He is not diaphoretic.   ?Cardiovascular:  ?   Rate and Rhythm: Normal rate and regular rhythm.  ?   Heart sounds: Normal heart sounds.  ?Pulmonary:  ?   Effort: Pulmonary effort is normal.  ?   Breath sounds: Normal breath sounds.  ?Musculoskeletal:  ?   Comments: Some lumbar muscular back tenderness and midline lumbar tenderness, no midline spine step-off, no overlying skin changes  ?Skin: ?   General: Skin is warm and dry.  ?Neurological:  ?   Mental Status: He is alert.  ?   Comments: 5/5 strength bilateral quads, hamstrings, plantarflexion, and dorsiflexion, sensation to light touch intact bilateral lower extremities  ? ? ? ?Assessment/Plan: Please see individual problem list. ? ?Problem List Items Addressed This Visit   ? ? CAD (coronary artery disease) (Chronic)  ?  Patient notes he has been off of his Plavix for about a month.  He notes somebody advised him to discontinue this.  I cannot find any evidence of that in the chart and per his cardiology note in January they had him continuing on the Plavix.  He will resume his Plavix. ? ?  ?  ? Low back pain - Primary (Chronic)  ?  This is an ongoing issue.  Acute on chronic back pain.  He has compression fractures that are likely contributing.  Tramadol has not been beneficial.  We will prescribe Vicodin.  Discussed risk of constipation and drowsiness with this.  Discussed this would be a short-term medication.  He will continue to see physical medicine and rehab.  I encouraged the patient's daughter to contact physical medicine and rehab office to see about a back brace and whether or not it would be necessary.  I also discussed the potential for having him see neurosurgery to get their input as well though we will wait to see how he does with treatment through physical medicine and rehab.  He notes no fevers or chills making a discitis unlikely.  His prior WBC was in the normal range. ? ?  ?  ? Relevant Medications  ? HYDROcodone-acetaminophen (NORCO/VICODIN) 5-325 MG tablet   ? ? ? ? ?Return in about 1 month (around 07/25/2021) for Follow-up back pain. ? ? ?[name], MD ?[place] ? ?

## 2021-06-24 NOTE — Patient Instructions (Signed)
Nice to see you. ?We will try Vicodin for your pain.  This could make you drowsy and if it does please discontinue use. ?If it makes you constipated please let us know. ?Please contact the physical medicine and rehab provider and see what they think about a back brace. ?

## 2021-06-24 NOTE — Assessment & Plan Note (Addendum)
This is an ongoing issue.  Acute on chronic back pain.  He has compression fractures that are likely contributing.  Tramadol has not been beneficial.  We will prescribe Vicodin.  Discussed risk of constipation and drowsiness with this.  Discussed this would be a short-term medication.  He will continue to see physical medicine and rehab.  I encouraged the patient's daughter to contact physical medicine and rehab office to see about a back brace and whether or not it would be necessary.  I also discussed the potential for having him see neurosurgery to get their input as well though we will wait to see how he does with treatment through physical medicine and rehab.  He notes no fevers or chills making a discitis unlikely.  His prior WBC was in the normal range. ?

## 2021-06-24 NOTE — Telephone Encounter (Signed)
LVM informing [facility] that patient does have a  MRI compatible CRTD system  ? ? ? ? ?

## 2021-06-24 NOTE — Assessment & Plan Note (Signed)
Patient notes he has been off of his Plavix for about a month.  He notes somebody advised him to discontinue this.  I cannot find any evidence of that in the chart and per his cardiology note in January they had him continuing on the Plavix.  He will resume his Plavix. ?

## 2021-06-25 ENCOUNTER — Other Ambulatory Visit (HOSPITAL_COMMUNITY): Payer: Self-pay | Admitting: Family Medicine

## 2021-06-25 ENCOUNTER — Other Ambulatory Visit: Payer: Self-pay | Admitting: Family Medicine

## 2021-06-25 ENCOUNTER — Ambulatory Visit: Payer: PPO | Admitting: Family Medicine

## 2021-06-25 DIAGNOSIS — M4646 Discitis, unspecified, lumbar region: Secondary | ICD-10-CM

## 2021-06-26 ENCOUNTER — Ambulatory Visit (HOSPITAL_COMMUNITY)
Admission: RE | Admit: 2021-06-26 | Discharge: 2021-06-26 | Disposition: A | Discharge status: Home / Self Care | Payer: PPO | Source: Ambulatory Visit | Attending: Family Medicine | Admitting: Family Medicine

## 2021-06-26 DIAGNOSIS — S32010A Wedge compression fracture of first lumbar vertebra, initial encounter for closed fracture: Secondary | ICD-10-CM | POA: Diagnosis not present

## 2021-06-26 DIAGNOSIS — M4646 Discitis, unspecified, lumbar region: Secondary | ICD-10-CM | POA: Diagnosis not present

## 2021-06-26 MED ORDER — GADOBUTROL 1 MMOL/ML IV SOLN
10.0000 mL | Freq: Once | INTRAVENOUS | Status: AC | PRN
  Administered 2021-06-26: 10 mL via INTRAVENOUS

## 2021-06-26 NOTE — Progress Notes (Signed)
Per order, Interrogation from today reviewed with industry who recommend tachy therapies off and MRI mode on.   Will program device back to pre-MRI settings after completion of exam.

## 2021-06-26 NOTE — Progress Notes (Signed)
Informed of MRI for today.   Device system confirmed to be MRI conditional, with implant date > 6 weeks ago, and no evidence of abandoned or epicardial leads in review of most recent CXR  Interrogation from today reviewed with industry who recommend tachy therapies off and MRI mode on.   Program device back to pre-MRI settings after completion of exam.  Annamaria Helling  06/26/2021 1:45 PM

## 2021-07-08 ENCOUNTER — Ambulatory Visit (INDEPENDENT_AMBULATORY_CARE_PROVIDER_SITE_OTHER): Payer: PPO

## 2021-07-08 DIAGNOSIS — I4891 Unspecified atrial fibrillation: Secondary | ICD-10-CM

## 2021-07-08 DIAGNOSIS — I472 Ventricular tachycardia, unspecified: Secondary | ICD-10-CM

## 2021-07-09 LAB — CUP PACEART REMOTE DEVICE CHECK
Battery Remaining Longevity: 72 mo
Battery Remaining Percentage: 79 %
Battery Voltage: 2.99 V
Brady Statistic AP VP Percent: 0 %
Brady Statistic AP VS Percent: 0 %
Brady Statistic AS VP Percent: 0 %
Brady Statistic AS VS Percent: 0 %
Brady Statistic RA Percent Paced: 1 %
Date Time Interrogation Session: 20230530030020
HighPow Impedance: 77 Ohm
Implantable Lead Implant Date: 20220228
Implantable Lead Implant Date: 20220228
Implantable Lead Implant Date: 20220228
Implantable Lead Location: 753858
Implantable Lead Location: 753859
Implantable Lead Location: 753860
Implantable Pulse Generator Implant Date: 20220228
Lead Channel Impedance Value: 440 Ohm
Lead Channel Impedance Value: 500 Ohm
Lead Channel Impedance Value: 800 Ohm
Lead Channel Pacing Threshold Amplitude: 0.75 V
Lead Channel Pacing Threshold Amplitude: 1.5 V
Lead Channel Pacing Threshold Amplitude: 1.5 V
Lead Channel Pacing Threshold Pulse Width: 0.5 ms
Lead Channel Pacing Threshold Pulse Width: 0.5 ms
Lead Channel Pacing Threshold Pulse Width: 0.8 ms
Lead Channel Sensing Intrinsic Amplitude: 1 mV
Lead Channel Sensing Intrinsic Amplitude: 12 mV
Lead Channel Setting Pacing Amplitude: 2 V
Lead Channel Setting Pacing Amplitude: 2.5 V
Lead Channel Setting Pacing Amplitude: 2.5 V
Lead Channel Setting Pacing Pulse Width: 0.5 ms
Lead Channel Setting Pacing Pulse Width: 0.8 ms
Lead Channel Setting Sensing Sensitivity: 0.5 mV
Pulse Gen Serial Number: 810018869

## 2021-07-10 ENCOUNTER — Ambulatory Visit (HOSPITAL_COMMUNITY): Payer: PPO

## 2021-07-10 ENCOUNTER — Telehealth: Payer: Self-pay | Admitting: Family Medicine

## 2021-07-10 ENCOUNTER — Ambulatory Visit: Payer: PPO | Admitting: Cardiovascular Disease

## 2021-07-10 DIAGNOSIS — M545 Low back pain, unspecified: Secondary | ICD-10-CM

## 2021-07-10 NOTE — Telephone Encounter (Signed)
Pt daughter called stating pt need refill on HYDROcodone-acetaminophen [facility]

## 2021-07-11 ENCOUNTER — Ambulatory Visit (HOSPITAL_COMMUNITY): Payer: PPO

## 2021-07-11 DIAGNOSIS — M12562 Traumatic arthropathy, left knee: Secondary | ICD-10-CM | POA: Diagnosis not present

## 2021-07-11 DIAGNOSIS — M170 Bilateral primary osteoarthritis of knee: Secondary | ICD-10-CM | POA: Diagnosis not present

## 2021-07-11 MED ORDER — HYDROCODONE-ACETAMINOPHEN 5-325 MG PO TABS: 1.0000 | ORAL_TABLET | Freq: Four times a day (QID) | ORAL | 0 refills | Status: AC | PRN

## 2021-07-11 NOTE — Telephone Encounter (Signed)
Patient's daughter called wanting to know the status of the refill. Patient only has 1 1/2 tablets remaining.

## 2021-07-11 NOTE — Addendum Note (Signed)
Addended by: [name] Jessenia Filippone G on: 07/11/2021 12:56 PM   Modules accepted: Orders

## 2021-07-11 NOTE — Telephone Encounter (Signed)
I sent the refill to his pharmacy.  Can you find out how his back is doing and see how often he has had to take the hydrocodone?  Thanks.

## 2021-07-14 NOTE — Telephone Encounter (Signed)
Noted  

## 2021-07-14 NOTE — Telephone Encounter (Signed)
I called and spoke with the patients daughter and she stated the patient is doing better and he only takes the hydrocodone as needed and its not often when he takes it he takes about 21/2 and its probably once a week.  The daughter stated he watches how he takes it because he does not like to take it a lot, but she picked up the medicine on Friday and he is doing better.  [employer],cma

## 2021-07-15 ENCOUNTER — Emergency Department (HOSPITAL_COMMUNITY): Payer: PPO

## 2021-07-15 ENCOUNTER — Inpatient Hospital Stay (HOSPITAL_COMMUNITY)
Admission: EM | Admit: 2021-07-15 | Discharge: 2021-08-09 | DRG: 871 | Disposition: E | Payer: PPO | Attending: Pulmonary Disease | Admitting: Pulmonary Disease

## 2021-07-15 DIAGNOSIS — E871 Hypo-osmolality and hyponatremia: Secondary | ICD-10-CM | POA: Diagnosis not present

## 2021-07-15 DIAGNOSIS — I4819 Other persistent atrial fibrillation: Secondary | ICD-10-CM | POA: Diagnosis present

## 2021-07-15 DIAGNOSIS — R579 Shock, unspecified: Secondary | ICD-10-CM | POA: Diagnosis not present

## 2021-07-15 DIAGNOSIS — K219 Gastro-esophageal reflux disease without esophagitis: Secondary | ICD-10-CM | POA: Diagnosis present

## 2021-07-15 DIAGNOSIS — R4182 Altered mental status, unspecified: Secondary | ICD-10-CM | POA: Diagnosis not present

## 2021-07-15 DIAGNOSIS — N390 Urinary tract infection, site not specified: Secondary | ICD-10-CM | POA: Diagnosis present

## 2021-07-15 DIAGNOSIS — Z4682 Encounter for fitting and adjustment of non-vascular catheter: Secondary | ICD-10-CM | POA: Diagnosis not present

## 2021-07-15 DIAGNOSIS — A419 Sepsis, unspecified organism: Secondary | ICD-10-CM

## 2021-07-15 DIAGNOSIS — Z515 Encounter for palliative care: Secondary | ICD-10-CM | POA: Diagnosis not present

## 2021-07-15 DIAGNOSIS — R6521 Severe sepsis with septic shock: Secondary | ICD-10-CM | POA: Diagnosis present

## 2021-07-15 DIAGNOSIS — J811 Chronic pulmonary edema: Secondary | ICD-10-CM | POA: Diagnosis not present

## 2021-07-15 DIAGNOSIS — E785 Hyperlipidemia, unspecified: Secondary | ICD-10-CM | POA: Diagnosis present

## 2021-07-15 DIAGNOSIS — I130 Hypertensive heart and chronic kidney disease with heart failure and stage 1 through stage 4 chronic kidney disease, or unspecified chronic kidney disease: Secondary | ICD-10-CM | POA: Diagnosis present

## 2021-07-15 DIAGNOSIS — N1831 Chronic kidney disease, stage 3a: Secondary | ICD-10-CM | POA: Diagnosis present

## 2021-07-15 DIAGNOSIS — J9690 Respiratory failure, unspecified, unspecified whether with hypoxia or hypercapnia: Secondary | ICD-10-CM | POA: Diagnosis not present

## 2021-07-15 DIAGNOSIS — E1142 Type 2 diabetes mellitus with diabetic polyneuropathy: Secondary | ICD-10-CM | POA: Diagnosis present

## 2021-07-15 DIAGNOSIS — E872 Acidosis, unspecified: Secondary | ICD-10-CM | POA: Diagnosis not present

## 2021-07-15 DIAGNOSIS — R188 Other ascites: Secondary | ICD-10-CM | POA: Diagnosis present

## 2021-07-15 DIAGNOSIS — C220 Liver cell carcinoma: Secondary | ICD-10-CM | POA: Diagnosis not present

## 2021-07-15 DIAGNOSIS — E1122 Type 2 diabetes mellitus with diabetic chronic kidney disease: Secondary | ICD-10-CM | POA: Diagnosis present

## 2021-07-15 DIAGNOSIS — Z043 Encounter for examination and observation following other accident: Secondary | ICD-10-CM | POA: Diagnosis not present

## 2021-07-15 DIAGNOSIS — I700 Atherosclerosis of aorta: Secondary | ICD-10-CM | POA: Diagnosis not present

## 2021-07-15 DIAGNOSIS — J9601 Acute respiratory failure with hypoxia: Secondary | ICD-10-CM | POA: Diagnosis not present

## 2021-07-15 DIAGNOSIS — G9341 Metabolic encephalopathy: Secondary | ICD-10-CM | POA: Diagnosis present

## 2021-07-15 DIAGNOSIS — S32010A Wedge compression fracture of first lumbar vertebra, initial encounter for closed fracture: Secondary | ICD-10-CM | POA: Diagnosis not present

## 2021-07-15 DIAGNOSIS — Z66 Do not resuscitate: Secondary | ICD-10-CM | POA: Diagnosis not present

## 2021-07-15 DIAGNOSIS — J189 Pneumonia, unspecified organism: Secondary | ICD-10-CM | POA: Diagnosis not present

## 2021-07-15 DIAGNOSIS — A021 Salmonella sepsis: Principal | ICD-10-CM | POA: Diagnosis present

## 2021-07-15 DIAGNOSIS — J439 Emphysema, unspecified: Secondary | ICD-10-CM | POA: Diagnosis not present

## 2021-07-15 DIAGNOSIS — E669 Obesity, unspecified: Secondary | ICD-10-CM | POA: Diagnosis present

## 2021-07-15 DIAGNOSIS — S41112A Laceration without foreign body of left upper arm, initial encounter: Secondary | ICD-10-CM | POA: Diagnosis present

## 2021-07-15 DIAGNOSIS — I517 Cardiomegaly: Secondary | ICD-10-CM | POA: Diagnosis not present

## 2021-07-15 DIAGNOSIS — I5022 Chronic systolic (congestive) heart failure: Secondary | ICD-10-CM | POA: Diagnosis present

## 2021-07-15 DIAGNOSIS — K922 Gastrointestinal hemorrhage, unspecified: Secondary | ICD-10-CM | POA: Diagnosis not present

## 2021-07-15 DIAGNOSIS — J90 Pleural effusion, not elsewhere classified: Secondary | ICD-10-CM | POA: Diagnosis not present

## 2021-07-15 DIAGNOSIS — Z20822 Contact with and (suspected) exposure to covid-19: Secondary | ICD-10-CM | POA: Diagnosis present

## 2021-07-15 DIAGNOSIS — I447 Left bundle-branch block, unspecified: Secondary | ICD-10-CM | POA: Diagnosis present

## 2021-07-15 DIAGNOSIS — Z95810 Presence of automatic (implantable) cardiac defibrillator: Secondary | ICD-10-CM

## 2021-07-15 DIAGNOSIS — I2699 Other pulmonary embolism without acute cor pulmonale: Secondary | ICD-10-CM | POA: Diagnosis not present

## 2021-07-15 DIAGNOSIS — K8020 Calculus of gallbladder without cholecystitis without obstruction: Secondary | ICD-10-CM | POA: Diagnosis not present

## 2021-07-15 DIAGNOSIS — Z79891 Long term (current) use of opiate analgesic: Secondary | ICD-10-CM

## 2021-07-15 DIAGNOSIS — R1010 Upper abdominal pain, unspecified: Secondary | ICD-10-CM | POA: Diagnosis not present

## 2021-07-15 DIAGNOSIS — R918 Other nonspecific abnormal finding of lung field: Secondary | ICD-10-CM | POA: Diagnosis not present

## 2021-07-15 DIAGNOSIS — W19XXXA Unspecified fall, initial encounter: Secondary | ICD-10-CM | POA: Diagnosis present

## 2021-07-15 DIAGNOSIS — Z79899 Other long term (current) drug therapy: Secondary | ICD-10-CM

## 2021-07-15 DIAGNOSIS — R0989 Other specified symptoms and signs involving the circulatory and respiratory systems: Secondary | ICD-10-CM | POA: Diagnosis not present

## 2021-07-15 DIAGNOSIS — R109 Unspecified abdominal pain: Secondary | ICD-10-CM | POA: Diagnosis not present

## 2021-07-15 DIAGNOSIS — N179 Acute kidney failure, unspecified: Secondary | ICD-10-CM | POA: Diagnosis not present

## 2021-07-15 DIAGNOSIS — M549 Dorsalgia, unspecified: Secondary | ICD-10-CM | POA: Diagnosis not present

## 2021-07-15 DIAGNOSIS — J9811 Atelectasis: Secondary | ICD-10-CM | POA: Diagnosis not present

## 2021-07-15 DIAGNOSIS — R0689 Other abnormalities of breathing: Secondary | ICD-10-CM | POA: Diagnosis not present

## 2021-07-15 DIAGNOSIS — Z923 Personal history of irradiation: Secondary | ICD-10-CM

## 2021-07-15 DIAGNOSIS — Z955 Presence of coronary angioplasty implant and graft: Secondary | ICD-10-CM

## 2021-07-15 DIAGNOSIS — Z8249 Family history of ischemic heart disease and other diseases of the circulatory system: Secondary | ICD-10-CM

## 2021-07-15 DIAGNOSIS — F05 Delirium due to known physiological condition: Secondary | ICD-10-CM | POA: Diagnosis not present

## 2021-07-15 DIAGNOSIS — R945 Abnormal results of liver function studies: Secondary | ICD-10-CM | POA: Diagnosis not present

## 2021-07-15 DIAGNOSIS — M545 Low back pain, unspecified: Secondary | ICD-10-CM | POA: Diagnosis not present

## 2021-07-15 DIAGNOSIS — R9431 Abnormal electrocardiogram [ECG] [EKG]: Secondary | ICD-10-CM | POA: Diagnosis not present

## 2021-07-15 DIAGNOSIS — I10 Essential (primary) hypertension: Secondary | ICD-10-CM | POA: Diagnosis not present

## 2021-07-15 DIAGNOSIS — I2510 Atherosclerotic heart disease of native coronary artery without angina pectoris: Secondary | ICD-10-CM | POA: Diagnosis present

## 2021-07-15 DIAGNOSIS — Z452 Encounter for adjustment and management of vascular access device: Secondary | ICD-10-CM | POA: Diagnosis not present

## 2021-07-15 DIAGNOSIS — R000 Tachycardia, unspecified: Secondary | ICD-10-CM | POA: Diagnosis not present

## 2021-07-15 DIAGNOSIS — K7581 Nonalcoholic steatohepatitis (NASH): Secondary | ICD-10-CM | POA: Diagnosis present

## 2021-07-15 DIAGNOSIS — Z8711 Personal history of peptic ulcer disease: Secondary | ICD-10-CM

## 2021-07-15 DIAGNOSIS — J984 Other disorders of lung: Secondary | ICD-10-CM | POA: Diagnosis not present

## 2021-07-15 DIAGNOSIS — Z7902 Long term (current) use of antithrombotics/antiplatelets: Secondary | ICD-10-CM

## 2021-07-15 DIAGNOSIS — G319 Degenerative disease of nervous system, unspecified: Secondary | ICD-10-CM | POA: Diagnosis not present

## 2021-07-15 DIAGNOSIS — K7460 Unspecified cirrhosis of liver: Secondary | ICD-10-CM | POA: Diagnosis not present

## 2021-07-15 DIAGNOSIS — E1169 Type 2 diabetes mellitus with other specified complication: Secondary | ICD-10-CM | POA: Diagnosis present

## 2021-07-15 DIAGNOSIS — Z7985 Long-term (current) use of injectable non-insulin antidiabetic drugs: Secondary | ICD-10-CM

## 2021-07-15 DIAGNOSIS — I255 Ischemic cardiomyopathy: Secondary | ICD-10-CM | POA: Diagnosis present

## 2021-07-15 DIAGNOSIS — Z87891 Personal history of nicotine dependence: Secondary | ICD-10-CM

## 2021-07-15 DIAGNOSIS — R0902 Hypoxemia: Secondary | ICD-10-CM | POA: Diagnosis not present

## 2021-07-15 LAB — CBC WITH DIFFERENTIAL/PLATELET
Abs Immature Granulocytes: 0.09 10*3/uL — ABNORMAL HIGH (ref 0.00–0.07)
Basophils Absolute: 0 10*3/uL (ref 0.0–0.1)
Basophils Relative: 0 %
Eosinophils Absolute: 0 10*3/uL (ref 0.0–0.5)
Eosinophils Relative: 0 %
HCT: 52.4 % — ABNORMAL HIGH (ref 39.0–52.0)
Hemoglobin: 17 g/dL (ref 13.0–17.0)
Immature Granulocytes: 1 %
Lymphocytes Relative: 7 %
Lymphs Abs: 0.5 10*3/uL — ABNORMAL LOW (ref 0.7–4.0)
MCH: 32.8 pg (ref 26.0–34.0)
MCHC: 32.4 g/dL (ref 30.0–36.0)
MCV: 101.2 fL — ABNORMAL HIGH (ref 80.0–100.0)
Monocytes Absolute: 0.1 10*3/uL (ref 0.1–1.0)
Monocytes Relative: 1 %
Neutro Abs: 5.9 10*3/uL (ref 1.7–7.7)
Neutrophils Relative %: 91 %
Platelets: 258 10*3/uL (ref 150–400)
RBC: 5.18 MIL/uL (ref 4.22–5.81)
RDW: 15.6 % — ABNORMAL HIGH (ref 11.5–15.5)
WBC: 6.5 10*3/uL (ref 4.0–10.5)
nRBC: 0.5 % — ABNORMAL HIGH (ref 0.0–0.2)

## 2021-07-15 LAB — LACTIC ACID, PLASMA
Lactic Acid, Venous: 8.9 mmol/L (ref 0.5–1.9)
Lactic Acid, Venous: >9 mmol/L (ref 0.5–1.9)

## 2021-07-15 LAB — GLUCOSE, CAPILLARY: Glucose-Capillary: 100 mg/dL — ABNORMAL HIGH (ref 70–99)

## 2021-07-15 LAB — COMPREHENSIVE METABOLIC PANEL
ALT: 64 U/L — ABNORMAL HIGH (ref 0–44)
AST: 103 U/L — ABNORMAL HIGH (ref 15–41)
Albumin: 2.2 g/dL — ABNORMAL LOW (ref 3.5–5.0)
Alkaline Phosphatase: 655 U/L — ABNORMAL HIGH (ref 38–126)
Anion gap: 20 — ABNORMAL HIGH (ref 5–15)
BUN: 28 mg/dL — ABNORMAL HIGH (ref 8–23)
CO2: 14 mmol/L — ABNORMAL LOW (ref 22–32)
Calcium: 8.6 mg/dL — ABNORMAL LOW (ref 8.9–10.3)
Chloride: 102 mmol/L (ref 98–111)
Creatinine, Ser: 2.14 mg/dL — ABNORMAL HIGH (ref 0.61–1.24)
GFR, Estimated: 32 mL/min — ABNORMAL LOW (ref >60)
Glucose, Bld: 133 mg/dL — ABNORMAL HIGH (ref 70–99)
Potassium: 5 mmol/L (ref 3.5–5.1)
Sodium: 136 mmol/L (ref 135–145)
Total Bilirubin: 2.8 mg/dL — ABNORMAL HIGH (ref 0.3–1.2)
Total Protein: 6.1 g/dL — ABNORMAL LOW (ref 6.5–8.1)

## 2021-07-15 LAB — I-STAT VENOUS BLOOD GAS, ED
Acid-base deficit: 5 mmol/L — ABNORMAL HIGH (ref 0.0–2.0)
Bicarbonate: 16.3 mmol/L — ABNORMAL LOW (ref 20.0–28.0)
Calcium, Ion: 0.94 mmol/L — ABNORMAL LOW (ref 1.15–1.40)
HCT: 46 % (ref 39.0–52.0)
Hemoglobin: 15.6 g/dL (ref 13.0–17.0)
O2 Saturation: 98 %
Potassium: 6.5 mmol/L (ref 3.5–5.1)
Sodium: 133 mmol/L — ABNORMAL LOW (ref 135–145)
TCO2: 17 mmol/L — ABNORMAL LOW (ref 22–32)
pCO2, Ven: 22.6 mmHg — ABNORMAL LOW (ref 44–60)
pH, Ven: 7.466 — ABNORMAL HIGH (ref 7.25–7.43)
pO2, Ven: 97 mmHg — ABNORMAL HIGH (ref 32–45)

## 2021-07-15 LAB — CBG MONITORING, ED: Glucose-Capillary: 116 mg/dL — ABNORMAL HIGH (ref 70–99)

## 2021-07-15 LAB — CK: Total CK: 114 U/L (ref 49–397)

## 2021-07-15 LAB — PROTIME-INR
INR: 1.5 — ABNORMAL HIGH (ref 0.8–1.2)
Prothrombin Time: 17.6 seconds — ABNORMAL HIGH (ref 11.4–15.2)

## 2021-07-15 LAB — APTT: aPTT: 30 seconds (ref 24–36)

## 2021-07-15 LAB — RESP PANEL BY RT-PCR (FLU A&B, COVID) ARPGX2
Influenza A by PCR: NEGATIVE
Influenza B by PCR: NEGATIVE
SARS Coronavirus 2 by RT PCR: NEGATIVE

## 2021-07-15 LAB — AMMONIA: Ammonia: 67 umol/L — ABNORMAL HIGH (ref 9–35)

## 2021-07-15 MED ORDER — SODIUM BICARBONATE 8.4 % IV SOLN
50.0000 meq | Freq: Once | INTRAVENOUS | Status: AC
  Administered 2021-07-15: 50 meq via INTRAVENOUS
  Filled 2021-07-15: qty 50

## 2021-07-15 MED ORDER — SODIUM CHLORIDE 0.9 % IV SOLN
2.0000 g | Freq: Once | INTRAVENOUS | Status: AC
  Administered 2021-07-15: 2 g via INTRAVENOUS
  Filled 2021-07-15: qty 12.5

## 2021-07-15 MED ORDER — ACETAMINOPHEN 650 MG RE SUPP
650.0000 mg | Freq: Once | RECTAL | Status: AC
  Administered 2021-07-15: 650 mg via RECTAL
  Filled 2021-07-15: qty 1

## 2021-07-15 MED ORDER — SODIUM CHLORIDE 0.9 % IV SOLN
2.0000 g | Freq: Two times a day (BID) | INTRAVENOUS | Status: DC
  Administered 2021-07-16: 2 g via INTRAVENOUS
  Filled 2021-07-15: qty 12.5

## 2021-07-15 MED ORDER — ALBUMIN HUMAN 25 % IV SOLN
25.0000 g | Freq: Once | INTRAVENOUS | Status: AC
Start: 2021-07-15 — End: 2021-07-15
  Administered 2021-07-15: 25 g via INTRAVENOUS
  Filled 2021-07-15: qty 100

## 2021-07-15 MED ORDER — SODIUM CHLORIDE 0.9 % IV SOLN: INTRAVENOUS | Status: DC | PRN

## 2021-07-15 MED ORDER — SODIUM CHLORIDE 0.9% FLUSH: 10.0000 mL | INTRAVENOUS | Status: DC | PRN

## 2021-07-15 MED ORDER — VANCOMYCIN HCL IN DEXTROSE 1-5 GM/200ML-% IV SOLN: 1000.0000 mg | Freq: Once | INTRAVENOUS | Status: DC

## 2021-07-15 MED ORDER — INSULIN ASPART 100 UNIT/ML IJ SOLN
0.0000 [IU] | INTRAMUSCULAR | Status: DC
  Administered 2021-07-16 (×2): 1 [IU] via SUBCUTANEOUS
  Administered 2021-07-16 – 2021-07-17 (×4): 2 [IU] via SUBCUTANEOUS
  Administered 2021-07-17: 1 [IU] via SUBCUTANEOUS
  Administered 2021-07-17 (×2): 2 [IU] via SUBCUTANEOUS
  Administered 2021-07-17 – 2021-07-18 (×3): 1 [IU] via SUBCUTANEOUS
  Administered 2021-07-18: 2 [IU] via SUBCUTANEOUS

## 2021-07-15 MED ORDER — CHLORHEXIDINE GLUCONATE CLOTH 2 % EX PADS: 6.0000 | MEDICATED_PAD | Freq: Every day | CUTANEOUS | Status: DC

## 2021-07-15 MED ORDER — VANCOMYCIN VARIABLE DOSE PER UNSTABLE RENAL FUNCTION (PHARMACIST DOSING)
Status: DC
Start: 2021-07-15 — End: 2021-07-16

## 2021-07-15 MED ORDER — LACTATED RINGERS IV BOLUS
500.0000 mL | Freq: Once | INTRAVENOUS | Status: AC
  Administered 2021-07-15: 500 mL via INTRAVENOUS

## 2021-07-15 MED ORDER — SODIUM CHLORIDE 0.9% FLUSH
10.0000 mL | Freq: Two times a day (BID) | INTRAVENOUS | Status: DC
  Administered 2021-07-16: 10 mL
  Administered 2021-07-17: 30 mL
  Administered 2021-07-18: 10 mL
  Administered 2021-07-18: 30 mL

## 2021-07-15 MED ORDER — METRONIDAZOLE 500 MG/100ML IV SOLN
500.0000 mg | Freq: Once | INTRAVENOUS | Status: AC
  Administered 2021-07-15: 500 mg via INTRAVENOUS
  Filled 2021-07-15: qty 100

## 2021-07-15 MED ORDER — VANCOMYCIN HCL 2000 MG/400ML IV SOLN
2000.0000 mg | Freq: Once | INTRAVENOUS | Status: AC
  Administered 2021-07-15: 2000 mg via INTRAVENOUS
  Filled 2021-07-15: qty 400

## 2021-07-15 MED ORDER — FENTANYL CITRATE PF 50 MCG/ML IJ SOSY
50.0000 ug | PREFILLED_SYRINGE | Freq: Once | INTRAMUSCULAR | Status: AC
  Administered 2021-07-15: 50 ug via INTRAVENOUS
  Filled 2021-07-15: qty 1

## 2021-07-15 MED ORDER — LACTATED RINGERS IV BOLUS
1000.0000 mL | Freq: Once | INTRAVENOUS | Status: AC
  Administered 2021-07-15: 1000 mL via INTRAVENOUS

## 2021-07-15 MED ORDER — SODIUM BICARBONATE 8.4 % IV SOLN: 100.0000 meq | Freq: Once | INTRAVENOUS | Status: DC

## 2021-07-15 MED ORDER — VASOPRESSIN 20 UNITS/100 ML INFUSION FOR SHOCK
0.0000 [IU]/min | INTRAVENOUS | Status: DC
  Administered 2021-07-15 – 2021-07-18 (×4): 0.03 [IU]/min via INTRAVENOUS
  Administered 2021-07-19: 0.04 [IU]/min via INTRAVENOUS
  Filled 2021-07-15 (×4): qty 100

## 2021-07-15 MED ORDER — NOREPINEPHRINE 4 MG/250ML-% IV SOLN
0.0000 ug/min | INTRAVENOUS | Status: DC
  Administered 2021-07-15: 5 ug/min via INTRAVENOUS
  Administered 2021-07-16: 14 ug/min via INTRAVENOUS
  Administered 2021-07-16: 16 ug/min via INTRAVENOUS
  Administered 2021-07-16: 17 ug/min via INTRAVENOUS
  Administered 2021-07-16: 11 ug/min via INTRAVENOUS
  Administered 2021-07-16: 12 ug/min via INTRAVENOUS
  Administered 2021-07-17 (×2): 18 ug/min via INTRAVENOUS
  Administered 2021-07-17: 14 ug/min via INTRAVENOUS
  Administered 2021-07-17: 20 ug/min via INTRAVENOUS
  Administered 2021-07-17: 16 ug/min via INTRAVENOUS
  Administered 2021-07-17: 18 ug/min via INTRAVENOUS
  Filled 2021-07-15 (×11): qty 250

## 2021-07-15 MED ORDER — LACTATED RINGERS IV SOLN
INTRAVENOUS | Status: DC
Start: 2021-07-15 — End: 2021-07-16

## 2021-07-15 NOTE — ED Notes (Signed)
I and O cath attempted x2. No return. Copious debris clogged catheter. 8Fr needed.

## 2021-07-15 NOTE — ED Notes (Signed)
This RN transports Pt to [facility]

## 2021-07-15 NOTE — H&P (Signed)
NAME:  Devin Hanna, MRN:  [id number], DOB:  1950/12/10, LOS: 0 ADMISSION DATE:  07/22/2021, CONSULTATION DATE:  07/23/2021 REFERRING MD:  EDP, CHIEF COMPLAINT:  AMS/fall   History of Present Illness:  71 yo male with pmh cad, dm2, obesity, htn, pud, ckdIIIa, paf, cirrhosis, recently dx's hepatocellular carcinoma s/p radiation therapy presented today after being found by his neighbor on the bathroom floor unable to get up. Pt states he was urinating and when he finished he became slightly light headed and tripped over his feet vs pass out. He is unclear about the specifics. When EMS arrived however he estimated he had been on the floor for approx. 30 mins. He was aaox3 and complaining of back pain (which is also chronic). He states he has been in his usual state of health prior to this event.   While being transported to [facility] pt became more altered and upon arrival was unable to communicate his own name. Pt baseline is independent, walks with walker, aaox3. He was taken for cth which was negative for acute process. Pt's labs were markedly abnormal and multiple hours after presentation while his mental status was improving his BP dropped to systolics in 35'C. He was started on norepi and subsequently vasopressin to maintain BP. His lactate cont to climb and he was admitted to ICU.   Pt states he is feeling better since presentation. He denies recent illness, stating "ive felt ok" no n/v/d no urinary symptoms (despite reports of foul smelling urine when he was found and sediment upon our catheterization). Denies fevers, daughter at bedside did endorse he likely was having chills as it was hot 3-4 days ago and while she was sweating he was shivering under 2-3 blankets. She states he didn't feel necessarily warm but no temperature was taken. Pt states he has been compliant with all of his m edications, eating and drinking well. He denies any pain with exception of his back which is chronic.    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 rate 19, SpO2 99 %.       No intake or output data in the 24 hours ending 07/16/2021 2256 There were no vitals filed for this visit.  Examination: General: laying supine in bed, with previous contusions on head, awake alert HENT: contusions with dried blood on forehead, eomi, perrla, mmmp Lungs: cta b Cardiovascular: irreg irreg Abdomen: protuberant, soft, nt bs+ Extremities: + edema to thigh on L and knee on R, no clubbing or cyanosis, noted bandages on R hand and arm  Neuro: aaox3, no focal deficits.  GU: deferred  Resolved Hospital Problem list     Assessment & Plan:  Shock, undetermined etiology:  -suspect 2/2 sepsis 2/2 uti vs ischemia 2/2 thrombosis with h/o malignancy -titrate pressors to map >65 -will need central line and optimally aline -abx, f/u cx -await results from cta abdomen  Ams:  -pt was found down at home but was aaox.3 at presentation however this declined and is hours later now improving.  -no focal deficits -suspect 2/2 hypotension that was  also late presenting -thankfully cth negative for acute issue -improving at this time.   Aki:  -follow indices and uop -2/2 shock +/- infection Transaminitis Elevated alk phos:  -chronically elevated since malignancy dx -will follow lft -no stigmata of pancreatitis   Metabolic/lactic acidosis:  -pt initally was not hypotensive and his lactate cont to climb despite some ivf -possible ischemia with afib not on a/c and active  malignancy -giving 3 amp bicarb -cont ivf as able.  -f/u cta abd/pelvis for thrombosis.   Sepsis 2/2 uti:  -febrile but wbc 6K  -f/u cx -will cont with cefepime and vanc -consider additing of anaerobic (appears received one dose in ed) if intra-abdominal process noted on imaging.   LE edema:  -R>>L (does have hardware in R) -will get le venous duplex for completeness sake  Fall:  -cth and c-spine negative for acute process -pt/ot when appropriate  Hepatocellular carcinoma:  -not surgical candidate had radiation beads placed with good response per chart reivew -there was potential areas of residual cancer, f/u imaging was to be in April but he has it rescheduled 2/2 inability to lay flat -afp much more increased from previous at 2600  T2dm:  -ssi -hold home meds at this time.  -check a1c  Chronic HFrEF:  Afib:  Icd in place -hold home meds with hypotension  Best Practice (right click and "Reselect all SmartList Selections" daily)   Diet/type: NPO w/ oral meds DVT prophylaxis: SCD GI prophylaxis: N/A Lines: Central line, Arterial Line, and yes and it is still needed Foley:  Yes, and it is still needed Code Status:  full code Last date of multidisciplinary goals of care discussion [07/22/2021 with pt and daughter at bedside]  Labs   CBC: Recent Labs  Lab 07/28/2021 1915 07/30/2021 2051  WBC 6.5  --   NEUTROABS 5.9  --   HGB 17.0 15.6  HCT 52.4* 46.0  MCV 101.2*  --   PLT 258  --     Basic Metabolic Panel: Recent Labs  Lab 07/17/2021 1915 07/23/2021 2051  NA 136 133*  K 5.0 6.5*  CL 102  --   CO2 14*  --   GLUCOSE 133*  --   BUN 28*  --   CREATININE 2.14*  --   CALCIUM 8.6*  --    GFR: CrCl cannot be calculated (Unknown ideal weight.). Recent Labs  Lab 07/10/2021 1915 07/10/2021 2039  WBC 6.5  --   LATICACIDVEN 8.9* >9.0*    Liver Function Tests: Recent Labs  Lab 07/20/2021 1915  AST 103*  ALT 64*  ALKPHOS 655*  BILITOT 2.8*  PROT 6.1*  ALBUMIN  2.2*   No results for input(s): LIPASE, AMYLASE in the last 168 hours. Recent Labs  Lab 07/31/2021 2039  AMMONIA 67*    ABG    Component Value Date/Time   PHART 7.293 (L) 09/08/2014 0841   PCO2ART 43.0 09/08/2014 0841   PO2ART 88.0 09/08/2014 0841   HCO3 16.3 (L) 07/20/2021 2051   TCO2 17 (L) 07/11/2021 2051   ACIDBASEDEF 5.0 (H) 07/10/2021 2051   O2SAT 98 07/26/2021 2051     Coagulation Profile: Recent Labs  Lab 07/27/2021 1915  INR 1.5*    Cardiac Enzymes: Recent Labs  Lab 08/07/2021 1915  CKTOTAL 114    HbA1C: Hemoglobin A1C  Date/Time Value Ref Range Status  04/07/2021 11:59 AM 5.7 (A) 4.0 - 5.6 % Final   Hgb A1c MFr Bld  Date/Time Value Ref Range Status  10/04/2020 09:01  AM 6.4 4.6 - 6.5 % Final    Comment:    Glycemic Control Guidelines for People with Diabetes:Non Diabetic:  <6%Goal of Therapy: <7%Additional Action Suggested:  >8%   06/26/2020 08:16 AM 6.3 4.6 - 6.5 % Final    Comment:    Glycemic Control Guidelines for People with Diabetes:Non Diabetic:  <6%Goal of Therapy: <7%Additional Action Suggested:  >8%     CBG: Recent Labs  Lab 08/08/2021 1844  GLUCAP 116*    Review of Systems:   As per HPI  Past Medical History:  He,  has a past medical history of Acute respiratory failure with hypoxia ([place]) (06/2016), CAD (coronary artery disease), CHF (congestive heart failure) ([place]), Diabetes mellitus, GERD (gastroesophageal reflux disease), HFrEF (heart failure with reduced ejection fraction) ([place]), Hyperlipidemia, Hypertension, Ischemic cardiomyopathy, LBBB (left bundle branch block), Liver mass, Morbid obesity ([place]), Peripheral neuropathy, Upper GI bleed, and Vertigo.   Surgical History:   Past Surgical History:  Procedure Laterality Date   BIV ICD INSERTION CRT-D N/A 04/08/2020   Procedure: BIV ICD INSERTION CRT-D;  Surgeon: Vickie Epley, MD;  Location: [place];  Service: Cardiovascular;  Laterality: N/A;   CARDIAC CATHETERIZATION  N/A 10/03/2014   Procedure: Left Heart Cath and Coronary Angiography;  Surgeon: [name], MD;  Location: [place] [facility];  Service: Cardiovascular;  Laterality: N/A;   CIRCUMCISION N/A 07/22/2015   Procedure: CIRCUMCISION ADULT;  Surgeon: Hollice Espy, MD;  Location: [facility];  Service: Urology;  Laterality: N/A;   COLONOSCOPY WITH PROPOFOL N/A 01/12/2019   Procedure: COLONOSCOPY WITH PROPOFOL;  Surgeon: Lucilla Lame, MD;  Location: [facility] ENDOSCOPY;  Service: Endoscopy;  Laterality: N/A;   CORONARY STENT PLACEMENT     ESOPHAGOGASTRODUODENOSCOPY (EGD) WITH PROPOFOL N/A 09/12/2014   Procedure: ESOPHAGOGASTRODUODENOSCOPY (EGD) WITH PROPOFOL;  Surgeon: Ronald Lobo, MD;  Location: [facility] ENDOSCOPY;  Service: Endoscopy;  Laterality: N/A;   EXTERNAL FIXATION LEG Left 04/03/2012   Procedure: EXTERNAL FIXATION LEG;  Surgeon: Wylene Simmer, MD;  Location: [place];  Service: Orthopedics;  Laterality: Left;   EXTERNAL FIXATION REMOVAL Left 04/14/2012   Procedure: REMOVAL EXTERNAL FIXATION LEG;  Surgeon: Wylene Simmer, MD;  Location: [place];  Service: Orthopedics;  Laterality: Left;   IR 3D INDEPENDENT WKST  10/16/2020   IR 3D INDEPENDENT WKST  10/30/2020   IR ANGIOGRAM SELECTIVE EACH ADDITIONAL VESSEL  10/16/2020   IR ANGIOGRAM SELECTIVE EACH ADDITIONAL VESSEL  10/30/2020   IR ANGIOGRAM VISCERAL SELECTIVE  10/16/2020   IR ANGIOGRAM VISCERAL SELECTIVE  10/30/2020   IR EMBO ARTERIAL NOT HEMORR HEMANG INC GUIDE ROADMAPPING  10/16/2020   IR EMBO TUMOR ORGAN ISCHEMIA INFARCT INC GUIDE ROADMAPPING  10/30/2020   IR RADIOLOGIST EVAL & MGMT  09/06/2020   IR RADIOLOGIST EVAL & MGMT  11/27/2020   IR RADIOLOGIST EVAL & MGMT  01/23/2021   IR US GUIDE VASC ACCESS RIGHT  10/16/2020   IR US GUIDE VASC ACCESS RIGHT  10/30/2020   IRRIGATION AND DEBRIDEMENT KNEE Left 04/03/2012   Procedure: IRRIGATION AND DEBRIDEMENT KNEE;  Surgeon: Wylene Simmer, MD;  Location: [place];  Service: Orthopedics;  Laterality: Left;   LEFT HEART CATHETERIZATION  WITH CORONARY ANGIOGRAM N/A 04/05/2013   Procedure: LEFT HEART CATHETERIZATION WITH CORONARY ANGIOGRAM;  Surgeon: [name], MD;  Location: [place] CATH LAB;  Service: Cardiovascular;  Laterality: N/A;   MASS EXCISION N/A 07/22/2015   Procedure: EXCISION PENILE MASS;  Surgeon: Hollice Espy, MD;  Location: [facility] ORS;  Service: Urology;  Laterality:  N/A;   ORIF TIBIA PLATEAU Left 04/14/2012   Procedure: OPEN REDUCTION INTERNAL FIXATION (ORIF) TIBIAL PLATEAU;  Surgeon: Wylene Simmer, MD;  Location: [place];  Service: Orthopedics;  Laterality: Left;   PERCUTANEOUS CORONARY ROTOBLATOR INTERVENTION (PCI-R) N/A 04/06/2013   Procedure: PERCUTANEOUS CORONARY ROTOBLATOR INTERVENTION (PCI-R);  Surgeon: [name], MD;  Location: [facility] CATH LAB;  Service: Cardiovascular;  Laterality: N/A;   RIGHT/LEFT HEART CATH AND CORONARY ANGIOGRAPHY N/A 08/21/2019   Procedure: RIGHT/LEFT HEART CATH AND CORONARY ANGIOGRAPHY;  Surgeon: [name], MD;  Location: [place] [facility];  Service: Cardiovascular;  Laterality: N/A;     Social History:   reports that he quit smoking about 26 years ago. His smoking use included cigarettes. He has a 120.00 pack-year smoking history. He has quit using smokeless tobacco. He reports that he does not drink alcohol and does not use drugs.   Family History:  His family history includes Breast cancer in his sister; Coronary artery disease in his brother and father; Lung cancer in his brother; Prostate cancer in his brother; Stroke in his sister.   Allergies No Known Allergies   Home Medications  Prior to Admission medications   Medication Sig Start Date End Date Taking? Authorizing Provider  CINNAMON PO Take 1,000 mg by mouth in the morning and at bedtime.    [provider]  clopidogrel (PLAVIX) 75 MG tablet TAKE ONE TABLET EVERY MORNING WITH BREAKFAST 10/17/20   [name], MD  cyclobenzaprine (FLEXERIL) 5 MG tablet Take 1 tablet (5 mg total) by mouth 2 (two)  times daily as needed for muscle spasms (will scause drowsiness take caution.). 02/25/21   Flinchum, Kelby Aline, FNP  dapagliflozin propanediol ([name]) 10 MG TABS tablet Take 1 tablet (10 mg total) by mouth daily before breakfast. 02/07/21   [name], MD  Dulaglutide ([name]) 3 LJ/4.4BE SOPN Inject 3 mg as directed once a week. 02/07/21   [name], MD  ferrous sulfate 325 (65 FE) MG tablet Take 162.5 mg by mouth daily with breakfast.    [provider]  furosemide (LASIX) 40 MG tablet Take 1 tablet (40 mg total) by mouth daily. 10/24/20   [name], MD  gabapentin (NEURONTIN) 300 MG capsule Take 1 capsule (300 mg total) by mouth every morning AND 1 capsule (300 mg total) daily with lunch AND 2 capsules (600 mg total) at bedtime. 10/04/20   [name], MD  HYDROcodone-acetaminophen (NORCO/VICODIN) 5-325 MG tablet Take 1 tablet by mouth every 6 (six) hours as needed for moderate pain. 07/11/21   [name], MD  metoprolol succinate (TOPROL-XL) 50 MG 24 hr tablet TAKE 1.5 TABLETS BY MOUTH IN THE MORNINGAND AT BEDTIME. TAKE WITH OR IMMEDIATELYFOLLOWING A MEAL. 04/24/21   Shirley Friar, PA-C  mexiletine (MEXITIL) 150 MG capsule Take 1 capsule (150 mg total) by mouth 3 (three) times daily. 04/24/21   Shirley Friar, PA-C  Multiple Vitamin (MULTIVITAMIN WITH MINERALS) TABS Take 1 tablet by mouth daily. Multivitamin for Men 50+    [provider]  nitroGLYCERIN (NITROSTAT) 0.4 MG SL tablet Place 1 tablet (0.4 mg total) under the tongue every 5 (five) minutes as needed for chest pain. 05/16/20   [name], MD  pantoprazole (PROTONIX) 40 MG tablet TAKE 1 TABLET BY MOUTH TWICE DAILY. 10/17/20   [name], MD  polyethylene glycol powder (GLYCOLAX/MIRALAX) 17 GM/SCOOP powder Take 17 g by mouth 2 (two) times daily as needed. 02/19/21  Dutch Quint B, FNP  potassium chloride SA (KLOR-CON M) 20 MEQ tablet Take 1 tablet (20 mEq  total) by mouth daily. 02/13/21   Vickie Epley, MD  sacubitril-valsartan (ENTRESTO) 24-26 MG Take 1 tablet by mouth 2 (two) times daily. Patient taking differently: Take 0.5 tablets by mouth 2 (two) times daily. 12/06/20   [name], MD  traMADol (ULTRAM) 50 MG tablet Take 1 tablet (50 mg total) by mouth 2 (two) times daily. 06/20/21   Burnard Hawthorne, FNP     Critical care time: 49 min

## 2021-07-15 NOTE — Progress Notes (Signed)
Devin Hanna a 71 y.o. male admitted on 08/08/2021 with altered mental status and concern for sepsis. PMH significant for cirrhosis, DM. EMS was called by a neighbor who found him down on the floor (EDT ~30 mins). New skin tears noted on left arm. Foul-smelling urine noted. Pharmacy has been consulted for vancomycin and cefepime dosing.  6/6: Scr 2.14 (BL ~ 1.1-1.2), Tm 101.2, tachycardic, LA 8.9 (6/6)  CrCl~42 mL/min    Plan: GIVE Vancomycin 2,000 mg IV x1 (Wt used: 97 kg- via flowsheets)  THEN Vancomycin Dosing per unstable renal function START Cefepime 2g IV Q12H  CONTINUE Flagyl 500 mg IV Q12H  Monitor renal function, clinical status, de-escalation, C/S, levels as indicated   Antimicrobials this admission: vancomycin 08/08/2021>>  Cefepime 6/6>>  Flagyl 6/6>>   Microbiology results: 6/6 Ucx: sent  6/6 Bcx: sent  6/6 Resp panel: sent    Adria Dill, [name] PGY-1 Acute Care Resident  07/23/2021 7:15 PM

## 2021-07-15 NOTE — ED Triage Notes (Signed)
Pt BIB [facility] from home. 1722-EMS called by neighbor. Pt found on floor. Pt initially calls ems for fall and back pain. Pt becomes altered on scene and shaking. Febrile 101.2. Hx falls. New wounds to left side- right sided wounds and head wounds from previous falls. On plavix. Foul smelling urine found on scene.

## 2021-07-15 NOTE — ED Provider Notes (Signed)
[facility] EMERGENCY DEPARTMENT Provider Note   CSN: [id number] Arrival date & time: 08/04/2021  5329     History  Chief Complaint  Patient presents with   Devin Hanna is a 71 y.o. male.  HPI Patient is a 71 year old male with a history of CAD, diabetes mellitus, obesity, hypertension, hyperlipidemia, PUD, DDD, CKD stage III, persistent atrial fibrillation, cirrhosis of liver, who presents to the emergency department due to altered mental status.  EMS notes that they were called by his neighbor who found him on the floor.  Estimated downtime of about 30 minutes.  He was found against the wall of his bathroom.  When EMS arrived he was initially A&O x3.  He was complaining of back pain after the fall.  C-collar was applied.  While in transport he became more altered.  Patient now with visible rigors but otherwise not answering questions or following commands.  New skin tears noted to the left arm.  Foul-smelling urine was noted in the toilet bowl at his home.  His grandson is at bedside and works with the [employer].  States that he is typically A&O x3 and has no difficulty with his ADLs.  Denies any known drug or alcohol use.  Level 5 caveat due to acuity of condition.  Echocardiogram on February 20, 2020 shows an LVEF of 30% to 35%.  Right ventricular systolic function is mildly reduced.    Home Medications Prior to Admission medications   Medication Sig Start Date End Date Taking? Authorizing Provider  CINNAMON PO Take 1,000 mg by mouth in the morning and at bedtime.    [provider]  clopidogrel (PLAVIX) 75 MG tablet TAKE ONE TABLET EVERY MORNING WITH BREAKFAST 10/17/20   [name], MD  cyclobenzaprine (FLEXERIL) 5 MG tablet Take 1 tablet (5 mg total) by mouth 2 (two) times daily as needed for muscle spasms (will scause drowsiness take caution.). 02/25/21   Flinchum, Kelby Aline, FNP  dapagliflozin propanediol ([name]) 10 MG TABS  tablet Take 1 tablet (10 mg total) by mouth daily before breakfast. 02/07/21   [name], MD  Dulaglutide ([name]) 3 JM/4.2AS SOPN Inject 3 mg as directed once a week. 02/07/21   [name], MD  ferrous sulfate 325 (65 FE) MG tablet Take 162.5 mg by mouth daily with breakfast.    [provider]  furosemide (LASIX) 40 MG tablet Take 1 tablet (40 mg total) by mouth daily. 10/24/20   [name], MD  gabapentin (NEURONTIN) 300 MG capsule Take 1 capsule (300 mg total) by mouth every morning AND 1 capsule (300 mg total) daily with lunch AND 2 capsules (600 mg total) at bedtime. 10/04/20   [name], MD  HYDROcodone-acetaminophen (NORCO/VICODIN) 5-325 MG tablet Take 1 tablet by mouth every 6 (six) hours as needed for moderate pain. 07/11/21   [name], MD  metoprolol succinate (TOPROL-XL) 50 MG 24 hr tablet TAKE 1.5 TABLETS BY MOUTH IN THE MORNINGAND AT BEDTIME. TAKE WITH OR IMMEDIATELYFOLLOWING A MEAL. 04/24/21   Shirley Friar, PA-C  mexiletine (MEXITIL) 150 MG capsule Take 1 capsule (150 mg total) by mouth 3 (three) times daily. 04/24/21   Shirley Friar, PA-C  Multiple Vitamin (MULTIVITAMIN WITH MINERALS) TABS Take 1 tablet by mouth daily. Multivitamin for Men 50+    [provider]  nitroGLYCERIN (NITROSTAT) 0.4 MG SL tablet Place 1 tablet (0.4 mg total) under the tongue every  5 (five) minutes as needed for chest pain. 05/16/20   [name], MD  pantoprazole (PROTONIX) 40 MG tablet TAKE 1 TABLET BY MOUTH TWICE DAILY. 10/17/20   [name], MD  polyethylene glycol powder (GLYCOLAX/MIRALAX) 17 GM/SCOOP powder Take 17 g by mouth 2 (two) times daily as needed. 02/19/21   Dutch Quint B, FNP  potassium chloride SA (KLOR-CON M) 20 MEQ tablet Take 1 tablet (20 mEq total) by mouth daily. 02/13/21   Vickie Epley, MD  sacubitril-valsartan (ENTRESTO) 24-26 MG Take 1 tablet by mouth 2 (two) times daily. Patient taking  differently: Take 0.5 tablets by mouth 2 (two) times daily. 12/06/20   [name], MD  traMADol (ULTRAM) 50 MG tablet Take 1 tablet (50 mg total) by mouth 2 (two) times daily. 06/20/21   Burnard Hawthorne, FNP      Allergies    Patient has no known allergies.    Review of Systems   Review of Systems  Unable to perform ROS: Acuity of condition   Physical Exam Updated Vital Signs BP 104/64   Pulse 99   Temp (!) 101.2 F (38.4 C) (Oral)   Resp (!) 29   SpO2 99%  Physical Exam Vitals and nursing note reviewed.  Constitutional:      General: He is in acute distress.     Appearance: He is ill-appearing. He is not toxic-appearing or diaphoretic.  HENT:     Head: Normocephalic and atraumatic.     Right Ear: External ear normal.     Left Ear: External ear normal.     Nose: Nose normal.     Mouth/Throat:     Mouth: Mucous membranes are moist.     Pharynx: Oropharynx is clear. No oropharyngeal exudate or posterior oropharyngeal erythema.  Eyes:     General: No scleral icterus.       Right eye: No discharge.        Left eye: No discharge.     Conjunctiva/sclera: Conjunctivae normal.     Pupils: Pupils are equal, round, and reactive to light.     Comments: Pupils equal, round, reactive to light.  Unable to assess EOMs.  Cardiovascular:     Rate and Rhythm: Normal rate and regular rhythm.     Pulses: Normal pulses.     Heart sounds: Normal heart sounds. No murmur heard.   No friction rub. No gallop.  Pulmonary:     Effort: Pulmonary effort is normal. No respiratory distress.     Breath sounds: Normal breath sounds. No stridor. No wheezing, rhonchi or rales.  Abdominal:     General: Abdomen is flat.     Tenderness: There is no abdominal tenderness.  Musculoskeletal:        General: Normal range of motion.     Cervical back: Normal range of motion and neck supple. No tenderness.     Right lower leg: Edema present.     Left lower leg: Edema present.     Comments: 2+  pitting edema in the BLEs.  Skin:    General: Skin is warm and dry.  Neurological:     Comments: Lying supine.  Not answering questions or following commands.  Psychiatric:        Mood and Affect: Mood normal.        Behavior: Behavior normal.   ED Results / Procedures / Treatments   Labs (all labs ordered are listed, but only abnormal results are displayed) Labs Reviewed  LACTIC ACID, PLASMA - Abnormal; Notable for the following components:      Result Value   Lactic Acid, Venous 8.9 (*)    All other components within normal limits  LACTIC ACID, PLASMA - Abnormal; Notable for the following components:   Lactic Acid, Venous >9.0 (*)    All other components within normal limits  COMPREHENSIVE METABOLIC PANEL - Abnormal; Notable for the following components:   CO2 14 (*)    Glucose, Bld 133 (*)    BUN 28 (*)    Creatinine, Ser 2.14 (*)    Calcium 8.6 (*)    Total Protein 6.1 (*)    Albumin 2.2 (*)    AST 103 (*)    ALT 64 (*)    Alkaline Phosphatase 655 (*)    Total Bilirubin 2.8 (*)    GFR, Estimated 32 (*)    Anion gap 20 (*)    All other components within normal limits  CBC WITH DIFFERENTIAL/PLATELET - Abnormal; Notable for the following components:   HCT 52.4 (*)    MCV 101.2 (*)    RDW 15.6 (*)    nRBC 0.5 (*)    Lymphs Abs 0.5 (*)    Abs Immature Granulocytes 0.09 (*)    All other components within normal limits  PROTIME-INR - Abnormal; Notable for the following components:   Prothrombin Time 17.6 (*)    INR 1.5 (*)    All other components within normal limits  AMMONIA - Abnormal; Notable for the following components:   Ammonia 67 (*)    All other components within normal limits  CBG MONITORING, ED - Abnormal; Notable for the following components:   Glucose-Capillary 116 (*)    All other components within normal limits  I-STAT VENOUS BLOOD GAS, ED - Abnormal; Notable for the following components:   pH, Ven 7.466 (*)    pCO2, Ven 22.6 (*)    pO2, Ven 97 (*)     Bicarbonate 16.3 (*)    TCO2 17 (*)    Acid-base deficit 5.0 (*)    Sodium 133 (*)    Potassium 6.5 (*)    Calcium, Ion 0.94 (*)    All other components within normal limits  RESP PANEL BY RT-PCR (FLU A&B, COVID) ARPGX2  CULTURE, BLOOD (ROUTINE X 2)  CULTURE, BLOOD (ROUTINE X 2)  URINE CULTURE  APTT  CK  URINALYSIS, ROUTINE W REFLEX MICROSCOPIC   EKG None  Radiology CT HEAD WO CONTRAST (5MM)  Result Date: 08/04/2021 CLINICAL DATA:  Status post fall. EXAM: CT HEAD WITHOUT CONTRAST TECHNIQUE: Contiguous axial images were obtained from the base of the skull through the vertex without intravenous contrast. RADIATION DOSE REDUCTION: This exam was performed according to the departmental dose-optimization program which includes automated exposure control, adjustment of the mA and/or kV according to patient size and/or use of iterative reconstruction technique. COMPARISON:  April 12, 2011 FINDINGS: Brain: There is mild cerebral atrophy with widening of the extra-axial spaces and ventricular dilatation. There are areas of decreased attenuation within the white matter tracts of the supratentorial brain, consistent with microvascular disease changes. Vascular: No hyperdense vessel or unexpected calcification. Skull: Normal. Negative for fracture or focal lesion. Sinuses/Orbits: No acute finding. Other: None. IMPRESSION: 1. No acute intracranial abnormality. 2. Generalized cerebral atrophy with chronic white matter small vessel ischemic changes. Electronically Signed   By: Virgina Norfolk M.D.   On: 07/14/2021 19:19   CT Cervical Spine Wo Contrast  Result Date: 07/13/2021 CLINICAL DATA:  Status post fall. EXAM: CT CERVICAL SPINE WITHOUT CONTRAST TECHNIQUE: Multidetector CT imaging of the cervical spine was performed without intravenous contrast. Multiplanar CT image reconstructions were also generated. RADIATION DOSE REDUCTION: This exam was performed according to the departmental dose-optimization  program which includes automated exposure control, adjustment of the mA and/or kV according to patient size and/or use of iterative reconstruction technique. COMPARISON:  None Available. FINDINGS: Alignment: Normal. Skull base and vertebrae: No acute fracture. A benign-appearing bone island is seen within the lamina of the T1 vertebral body on the left. Soft tissues and spinal canal: No prevertebral fluid or swelling. No visible canal hematoma. Disc levels: There is moderate to marked severity endplate sclerosis the levels of C5-C6 and C6-C7 with mild anterior osteophyte formation noted at the levels of C4-C5, C5-C6, C6-C7 and C7-T1. There is marked severity narrowing of the anterior atlantoaxial articulation with marked severity intervertebral disc space narrowing seen at C5-C6 and C6-C7. Mild intervertebral disc space narrowing is present throughout the remainder of the cervical spine. Bilateral moderate severity multilevel facet joint hypertrophy is noted. Upper chest: Negative. Other: None. IMPRESSION: 1. No acute fracture or subluxation of the cervical spine. 2. Marked severity degenerative changes at the levels of C5-C6 and C6-C7. Electronically Signed   By: Virgina Norfolk M.D.   On: 07/14/2021 19:22   DG Chest Port 1 View  Result Date: 07/13/2021 CLINICAL DATA:  Suspected sepsis, initial encounter EXAM: PORTABLE CHEST 1 VIEW COMPARISON:  02/25/2021 FINDINGS: Cardiac shadow is enlarged. Defibrillator is again seen and stable. Aortic calcifications are noted. Lungs are hypoinflated but clear. No acute bony abnormality is seen. Increased density is noted over the apices consistent with overlying soft tissue density. IMPRESSION: No acute abnormality noted. Electronically Signed   By: Inez Catalina M.D.   On: 08/04/2021 19:24    Procedures Procedures   Medications Ordered in ED Medications  lactated ringers infusion (has no administration in time range)  vancomycin (VANCOREADY) IVPB 2000 mg/400 mL  (has no administration in time range)  acetaminophen (TYLENOL) suppository 650 mg (has no administration in time range)  albumin human 25 % solution 25 g (has no administration in time range)  vancomycin variable dose per unstable renal function (pharmacist dosing) (has no administration in time range)  ceFEPIme (MAXIPIME) 2 g in sodium chloride 0.9 % 100 mL IVPB (has no administration in time range)  sodium bicarbonate injection 50 mEq (has no administration in time range)  lactated ringers bolus 1,000 mL (has no administration in time range)  lactated ringers bolus 500 mL (500 mLs Intravenous New Bag/Given 07/17/2021 1942)  ceFEPIme (MAXIPIME) 2 g in sodium chloride 0.9 % 100 mL IVPB (0 g Intravenous Stopped 07/10/2021 2035)  metroNIDAZOLE (FLAGYL) IVPB 500 mg (0 mg Intravenous Stopped 08/07/2021 2046)    ED Course/ Medical Decision Making/ A&P Clinical Course as of 07/17/2021 2144  Tue Jul 15, 2021  2020 Lactic Acid, Venous(!!): 8.9 [LJ]  2136 Lactic Acid, Venous(!!): >9.0 [LJ]  2136 Ammonia(!): 67 [LJ]    Clinical Course User Index [LJ] Rayna Sexton, PA-C                           Medical Decision Making Amount and/or Complexity of Data Reviewed Labs: ordered. Decision-making details documented in ED Course. Radiology: ordered. ECG/medicine tests: ordered.  Risk OTC drugs. Prescription drug management.   Pt is a 71 y.o. male who presents to the emergency department via EMS due to altered  mental status as well as a fall.  Please see HPI above for additional information.  Labs: Abnormalities as noted above.  Imaging: Imaging results as noted above.  I, Rayna Sexton, PA-C, personally reviewed and evaluated these images and lab results as part of my medical decision-making.  Patient found to be A&O x3 initially upon arrival.  While in transport with EMS mentation rapidly declined.  Unable to answer questions or follow commands upon arrival to the ED.  Found to be febrile and  tachycardic.  Code sepsis was initiated.  Significant history of CHF and is currently edematous in the lower extremities so patient was given 500 cc of IV fluids and started on broad-spectrum antibiotics.  Tylenol for fever.  Patient discussed with Dr. Ruthann Cancer with critical care.  Agrees with work-up thus far, including IV albumin.  Also recommends CT angiogram of the abdomen/pelvis given patient's findings.  Also recommends additional IV fluids given patient's significantly elevated lactic acid.  On reassessment patient's tachycardia appears to have improved.  Still significantly altered.  Lactic acid worsening from 8.9 to greater than 9.  Patient will require ICU admission.  Patient care discussed with Dr. Ruthann Cancer with critical care.  She will evaluate the patient at this time.  Nursing staff notified us that patient is now hypotensive.  Systolic around 70.  Patient will be started on Levophed.  Goals of care discussed with patient's daughters.  They confirmed that he is full code.  Note: Portions of this report may have been transcribed using voice recognition software. Every effort was made to ensure accuracy; however, inadvertent computerized transcription errors may be present.   Final Clinical Impression(s) / ED Diagnoses Final diagnoses:  Altered mental status, unspecified altered mental status type  Sepsis, due to unspecified organism, unspecified whether acute organ dysfunction present [facility])   Rx / DC Orders ED Discharge Orders     None         Rayna Sexton, PA-C 07/24/2021 2235    Teressa Lower, MD 07/16/21 1542

## 2021-07-15 NOTE — Sepsis Progress Note (Signed)
Sepsis protocol monitored by eLink 

## 2021-07-16 ENCOUNTER — Inpatient Hospital Stay (HOSPITAL_COMMUNITY): Payer: PPO

## 2021-07-16 DIAGNOSIS — A021 Salmonella sepsis: Principal | ICD-10-CM

## 2021-07-16 DIAGNOSIS — R579 Shock, unspecified: Secondary | ICD-10-CM

## 2021-07-16 DIAGNOSIS — I2699 Other pulmonary embolism without acute cor pulmonale: Secondary | ICD-10-CM

## 2021-07-16 DIAGNOSIS — G9341 Metabolic encephalopathy: Secondary | ICD-10-CM | POA: Diagnosis not present

## 2021-07-16 DIAGNOSIS — R6521 Severe sepsis with septic shock: Secondary | ICD-10-CM

## 2021-07-16 DIAGNOSIS — I10 Essential (primary) hypertension: Secondary | ICD-10-CM

## 2021-07-16 DIAGNOSIS — N179 Acute kidney failure, unspecified: Secondary | ICD-10-CM | POA: Diagnosis not present

## 2021-07-16 DIAGNOSIS — A419 Sepsis, unspecified organism: Secondary | ICD-10-CM | POA: Diagnosis not present

## 2021-07-16 LAB — BLOOD CULTURE ID PANEL (REFLEXED) - BCID2
A.calcoaceticus-baumannii: NOT DETECTED
Bacteroides fragilis: NOT DETECTED
CTX-M ESBL: NOT DETECTED
Candida albicans: NOT DETECTED
Candida auris: NOT DETECTED
Candida glabrata: NOT DETECTED
Candida krusei: NOT DETECTED
Candida parapsilosis: NOT DETECTED
Candida tropicalis: NOT DETECTED
Carbapenem resist OXA 48 LIKE: NOT DETECTED
Carbapenem resistance IMP: NOT DETECTED
Carbapenem resistance KPC: NOT DETECTED
Carbapenem resistance NDM: NOT DETECTED
Carbapenem resistance VIM: NOT DETECTED
Cryptococcus neoformans/gattii: NOT DETECTED
Enterobacter cloacae complex: NOT DETECTED
Enterobacterales: DETECTED — AB
Enterococcus Faecium: NOT DETECTED
Enterococcus faecalis: NOT DETECTED
Escherichia coli: NOT DETECTED
Haemophilus influenzae: NOT DETECTED
Klebsiella aerogenes: NOT DETECTED
Klebsiella oxytoca: NOT DETECTED
Klebsiella pneumoniae: NOT DETECTED
Listeria monocytogenes: NOT DETECTED
Neisseria meningitidis: NOT DETECTED
Proteus species: NOT DETECTED
Pseudomonas aeruginosa: NOT DETECTED
Salmonella species: DETECTED — AB
Serratia marcescens: NOT DETECTED
Staphylococcus aureus (BCID): NOT DETECTED
Staphylococcus epidermidis: NOT DETECTED
Staphylococcus lugdunensis: NOT DETECTED
Staphylococcus species: NOT DETECTED
Stenotrophomonas maltophilia: NOT DETECTED
Streptococcus agalactiae: NOT DETECTED
Streptococcus pneumoniae: NOT DETECTED
Streptococcus pyogenes: NOT DETECTED
Streptococcus species: NOT DETECTED

## 2021-07-16 LAB — GLUCOSE, CAPILLARY
Glucose-Capillary: 116 mg/dL — ABNORMAL HIGH (ref 70–99)
Glucose-Capillary: 144 mg/dL — ABNORMAL HIGH (ref 70–99)
Glucose-Capillary: 147 mg/dL — ABNORMAL HIGH (ref 70–99)
Glucose-Capillary: 153 mg/dL — ABNORMAL HIGH (ref 70–99)
Glucose-Capillary: 175 mg/dL — ABNORMAL HIGH (ref 70–99)
Glucose-Capillary: 194 mg/dL — ABNORMAL HIGH (ref 70–99)

## 2021-07-16 LAB — ECHOCARDIOGRAM COMPLETE
AR max vel: 2.99 cm2
AV Peak grad: 8.4 mmHg
Ao pk vel: 1.45 m/s
Area-P 1/2: 3.85 cm2
Calc EF: 32.5 %
MV M vel: 4.72 m/s
MV Peak grad: 88.9 mmHg
S' Lateral: 3.5 cm
Single Plane A2C EF: 34.4 %
Single Plane A4C EF: 32.4 %
Weight: 3350.99 oz

## 2021-07-16 LAB — URINALYSIS, ROUTINE W REFLEX MICROSCOPIC
Bilirubin Urine: NEGATIVE
Glucose, UA: 150 mg/dL — AB
Ketones, ur: NEGATIVE mg/dL
Nitrite: NEGATIVE
Protein, ur: NEGATIVE mg/dL
Specific Gravity, Urine: 1.021 (ref 1.005–1.030)
pH: 5 (ref 5.0–8.0)

## 2021-07-16 LAB — COMPREHENSIVE METABOLIC PANEL
ALT: 63 U/L — ABNORMAL HIGH (ref 0–44)
AST: 122 U/L — ABNORMAL HIGH (ref 15–41)
Albumin: 1.9 g/dL — ABNORMAL LOW (ref 3.5–5.0)
Alkaline Phosphatase: 550 U/L — ABNORMAL HIGH (ref 38–126)
Anion gap: 18 — ABNORMAL HIGH (ref 5–15)
BUN: 30 mg/dL — ABNORMAL HIGH (ref 8–23)
CO2: 16 mmol/L — ABNORMAL LOW (ref 22–32)
Calcium: 8 mg/dL — ABNORMAL LOW (ref 8.9–10.3)
Chloride: 100 mmol/L (ref 98–111)
Creatinine, Ser: 2.3 mg/dL — ABNORMAL HIGH (ref 0.61–1.24)
GFR, Estimated: 29 mL/min — ABNORMAL LOW (ref >60)
Glucose, Bld: 117 mg/dL — ABNORMAL HIGH (ref 70–99)
Potassium: 3.9 mmol/L (ref 3.5–5.1)
Sodium: 134 mmol/L — ABNORMAL LOW (ref 135–145)
Total Bilirubin: 2.7 mg/dL — ABNORMAL HIGH (ref 0.3–1.2)
Total Protein: 5.2 g/dL — ABNORMAL LOW (ref 6.5–8.1)

## 2021-07-16 LAB — CBC
HCT: 45.1 % (ref 39.0–52.0)
Hemoglobin: 15.4 g/dL (ref 13.0–17.0)
MCH: 33.6 pg (ref 26.0–34.0)
MCHC: 34.1 g/dL (ref 30.0–36.0)
MCV: 98.5 fL (ref 80.0–100.0)
Platelets: 225 10*3/uL (ref 150–400)
RBC: 4.58 MIL/uL (ref 4.22–5.81)
RDW: 15.6 % — ABNORMAL HIGH (ref 11.5–15.5)
WBC: 32.7 10*3/uL — ABNORMAL HIGH (ref 4.0–10.5)
nRBC: 0.2 % (ref 0.0–0.2)

## 2021-07-16 LAB — LACTIC ACID, PLASMA
Lactic Acid, Venous: 7.4 mmol/L (ref 0.5–1.9)
Lactic Acid, Venous: 6.6 mmol/L (ref 0.5–1.9)
Lactic Acid, Venous: 6.1 mmol/L (ref 0.5–1.9)
Lactic Acid, Venous: 4.8 mmol/L (ref 0.5–1.9)

## 2021-07-16 LAB — MRSA NEXT GEN BY PCR, NASAL: MRSA by PCR Next Gen: NOT DETECTED

## 2021-07-16 LAB — HEPARIN LEVEL (UNFRACTIONATED): Heparin Unfractionated: 0.35 IU/mL (ref 0.30–0.70)

## 2021-07-16 LAB — HEMOGLOBIN A1C
Hgb A1c MFr Bld: 5.6 % (ref 4.8–5.6)
Mean Plasma Glucose: 114.02 mg/dL

## 2021-07-16 MED ORDER — HEPARIN (PORCINE) 25000 UT/250ML-% IV SOLN
2100.0000 [IU]/h | INTRAVENOUS | Status: AC
  Administered 2021-07-16: 1300 [IU]/h via INTRAVENOUS
  Administered 2021-07-16: 1250 [IU]/h via INTRAVENOUS
  Administered 2021-07-17: 1850 [IU]/h via INTRAVENOUS
  Administered 2021-07-18: 2100 [IU]/h via INTRAVENOUS
  Filled 2021-07-16 (×4): qty 250

## 2021-07-16 MED ORDER — PERFLUTREN LIPID MICROSPHERE
1.0000 mL | INTRAVENOUS | Status: AC | PRN
  Administered 2021-07-16: 2 mL via INTRAVENOUS

## 2021-07-16 MED ORDER — CLOPIDOGREL BISULFATE 75 MG PO TABS: 75.0000 mg | ORAL_TABLET | Freq: Every day | ORAL | Status: DC

## 2021-07-16 MED ORDER — IOHEXOL 350 MG/ML SOLN
100.0000 mL | Freq: Once | INTRAVENOUS | Status: AC | PRN
  Administered 2021-07-16: 80 mL via INTRAVENOUS

## 2021-07-16 MED ORDER — CHLORHEXIDINE GLUCONATE CLOTH 2 % EX PADS
6.0000 | MEDICATED_PAD | Freq: Every day | CUTANEOUS | Status: DC
  Administered 2021-07-16 – 2021-07-19 (×5): 6 via TOPICAL

## 2021-07-16 MED ORDER — SODIUM CHLORIDE 0.9 % IV SOLN
2.0000 g | INTRAVENOUS | Status: DC
  Administered 2021-07-16 – 2021-07-17 (×2): 2 g via INTRAVENOUS
  Filled 2021-07-16 (×2): qty 20

## 2021-07-16 MED ORDER — HEPARIN BOLUS VIA INFUSION
4000.0000 [IU] | Freq: Once | INTRAVENOUS | Status: AC
  Administered 2021-07-16: 4000 [IU] via INTRAVENOUS
  Filled 2021-07-16: qty 4000

## 2021-07-16 MED ORDER — HYDROMORPHONE HCL 1 MG/ML IJ SOLN
0.5000 mg | INTRAMUSCULAR | Status: AC | PRN
  Administered 2021-07-16 – 2021-07-17 (×3): 0.5 mg via INTRAVENOUS
  Filled 2021-07-16 (×3): qty 0.5

## 2021-07-16 MED ORDER — FENTANYL CITRATE (PF) 100 MCG/2ML IJ SOLN
INTRAMUSCULAR | Status: AC
  Filled 2021-07-16: qty 2

## 2021-07-16 NOTE — Progress Notes (Signed)
ANTICOAGULATION CONSULT NOTE - Initial Consult  Pharmacy Consult for Heparin  Indication: pulmonary embolus  No Known Allergies  Patient Measurements: Weight: 95 kg (209 lb 7 oz)  Vital Signs: Temp: 100.4 F (38 C) (06/06 2311) Temp Source: Oral (06/06 2311) BP: 82/62 (06/07 0330) Pulse Rate: 92 (06/07 0330)  Labs: Recent Labs    07/25/2021 1915 07/31/2021 2051 07/16/21 0050  HGB 17.0 15.6 15.4  HCT 52.4* 46.0 45.1  PLT 258  --  225  APTT 30  --   --   LABPROT 17.6*  --   --   INR 1.5*  --   --   CREATININE 2.14*  --  2.30*  CKTOTAL 114  --   --     Estimated Creatinine Clearance: 30.6 mL/min (A) (by C-G formula based on SCr of 2.3 mg/dL (H)).   Medical History: Past Medical History:  Diagnosis Date   Acute respiratory failure with hypoxia ([place]) 06/2016   CAD (coronary artery disease)    a. Remote PCI of the RCA;  b. 03/2013 Inf STEMI/PCI: LAD 60-70d, RCA 99p ISR(rota/CBA/DES);  c. 09/2014 MV: inf and infsept infarct, EF 35-40%;  d. 09/2014 Cath: patent RCA stent, stable LAD dzs, EF 40%-->Med rx.   CHF (congestive heart failure) ([place])    Diabetes mellitus    sees Dr. Hardin Negus in [place]   GERD (gastroesophageal reflux disease)    HFrEF (heart failure with reduced ejection fraction) ([place])    Hyperlipidemia    Hypertension    Ischemic cardiomyopathy    a. 09/2014 EF 40% by V gram;  b. 06/2016 Echo: EF 40, diff HK, Gr1 DD, inf HK, no mural thrombus. Mild MR, mildly dil LA, mildly dil RV.    LBBB (left bundle branch block)    Old   Liver mass    Morbid obesity (HCC)    Peripheral neuropathy    Upper GI bleed    Vertigo     Assessment: 71 y/o M with altered mental status/shock/?UTI found to have small PE on CT angio. Starting heparin. CBC good. Noted renal dysfunction. Baseline INR is 1.5 in setting of liver dysfunction.   Goal of Therapy:  Heparin level 0.3-0.7 units/ml Monitor platelets by anticoagulation protocol: Yes   Plan:  Heparin 4000 units  BOLUS Start heparin drip at 1250 units/hr 1200 Heparin level Daily CBC/Heparin level Monitor for bleeding  Narda Bonds, [name], BCPS Clinical Pharmacist Phone: [phone]

## 2021-07-16 NOTE — Procedures (Addendum)
Central Venous Catheter Insertion Procedure Note  Devin Hanna  [id number]  19-Oct-1950  Date:07/16/21  Time:12:39 AM   Provider Performing:Marquice Uddin W [place]   Procedure: Insertion of Non-tunneled Central Venous [id number]) with US guidance (93810)   Indication(s) Medication administration  Consent Risks of the procedure as well as the alternatives and risks of each were explained to the patient and/or caregiver.  Consent for the procedure was obtained and is signed in the bedside chart  Anesthesia Topical only with 1% lidocaine   Timeout Verified patient identification, verified procedure, site/side was marked, verified correct patient position, special equipment/implants available, medications/allergies/relevant history reviewed, required imaging and test results available.  Sterile Technique Maximal sterile technique including full sterile barrier drape, hand hygiene, sterile gown, sterile gloves, mask, hair covering, sterile ultrasound probe cover (if used).  Procedure Description Area of catheter insertion was cleaned with chlorhexidine and draped in sterile fashion.  With real-time ultrasound guidance a central venous catheter was placed into the right internal jugular vein. Nonpulsatile blood flow and easy flushing noted in all ports.  The catheter was sutured in place and sterile dressing applied.  Complications/Tolerance None; patient tolerated the procedure well. Chest X-ray is ordered to verify placement for internal jugular or subclavian cannulation.   Chest x-ray is not ordered for femoral cannulation.  EBL Minimal  Specimen(s) None     Georgann Housekeeper, AGACNP-BC [facility]  See Amion for personal pager PCCM on call pager [phone] until 7pm. Please call Elink 7p-7a. [phone]  07/16/2021 12:39 AM

## 2021-07-16 NOTE — Progress Notes (Signed)
eLink Physician-Brief Progress Note Patient Name: Devin Hanna DOB: Sep 21, 1950 MRN: [id number]   Date of Service  07/16/2021  HPI/Events of Note  Patient with right lower lobe segmental pulmonary embolism.  eICU Interventions  Heparin gtt (pharmacy monitoring protocol) ordered.        Kerry Kass Waunita Sandstrom 07/16/2021, 4:02 AM

## 2021-07-16 NOTE — Progress Notes (Signed)
PHARMACY - PHYSICIAN COMMUNICATION CRITICAL VALUE ALERT - BLOOD CULTURE IDENTIFICATION (BCID)  Devin Hanna is an 71 y.o. male who presented to [facility] on 07/21/2021 with a chief complaint of AMS.  Patient ws found down by neighbor.  Assessment: BCID grew Salmonella spp.  Name of physician (or Provider) Contacted: Dr. Tacy Learn  Current antibiotics: Cefepime  Changes to prescribed antibiotics recommended:  Recommendations accepted by provider - narrow to Rocephin  Results for orders placed or performed during the hospital encounter of 07/27/2021  Blood Culture ID Panel (Reflexed) (Collected: 07/29/2021  7:15 PM)  Result Value Ref Range   Enterococcus faecalis NOT DETECTED NOT DETECTED   Enterococcus Faecium NOT DETECTED NOT DETECTED   Listeria monocytogenes NOT DETECTED NOT DETECTED   Staphylococcus species NOT DETECTED NOT DETECTED   Staphylococcus aureus (BCID) NOT DETECTED NOT DETECTED   Staphylococcus epidermidis NOT DETECTED NOT DETECTED   Staphylococcus lugdunensis NOT DETECTED NOT DETECTED   Streptococcus species NOT DETECTED NOT DETECTED   Streptococcus agalactiae NOT DETECTED NOT DETECTED   Streptococcus pneumoniae NOT DETECTED NOT DETECTED   Streptococcus pyogenes NOT DETECTED NOT DETECTED   A.calcoaceticus-baumannii NOT DETECTED NOT DETECTED   Bacteroides fragilis NOT DETECTED NOT DETECTED   Enterobacterales DETECTED (A) NOT DETECTED   Enterobacter cloacae complex NOT DETECTED NOT DETECTED   Escherichia coli NOT DETECTED NOT DETECTED   Klebsiella aerogenes NOT DETECTED NOT DETECTED   Klebsiella oxytoca NOT DETECTED NOT DETECTED   Klebsiella pneumoniae NOT DETECTED NOT DETECTED   Proteus species NOT DETECTED NOT DETECTED   Salmonella species DETECTED (A) NOT DETECTED   Serratia marcescens NOT DETECTED NOT DETECTED   Haemophilus influenzae NOT DETECTED NOT DETECTED   Neisseria meningitidis NOT DETECTED NOT DETECTED   Pseudomonas aeruginosa NOT DETECTED NOT DETECTED    Stenotrophomonas maltophilia NOT DETECTED NOT DETECTED   Candida albicans NOT DETECTED NOT DETECTED   Candida auris NOT DETECTED NOT DETECTED   Candida glabrata NOT DETECTED NOT DETECTED   Candida krusei NOT DETECTED NOT DETECTED   Candida parapsilosis NOT DETECTED NOT DETECTED   Candida tropicalis NOT DETECTED NOT DETECTED   Cryptococcus neoformans/gattii NOT DETECTED NOT DETECTED   CTX-M ESBL NOT DETECTED NOT DETECTED   Carbapenem resistance IMP NOT DETECTED NOT DETECTED   Carbapenem resistance KPC NOT DETECTED NOT DETECTED   Carbapenem resistance NDM NOT DETECTED NOT DETECTED   Carbapenem resist OXA 48 LIKE NOT DETECTED NOT DETECTED   Carbapenem resistance VIM NOT DETECTED NOT DETECTED    Jlynn Ly D. Mina Marble, [name], BCPS, [place] 07/16/2021, 9:09 AM

## 2021-07-16 NOTE — Consult Note (Signed)
[facility]  Total days of antibiotics 2          Reason for Consult:salmonella sepsis   Referring Physician: chand  Principal Problem:   Septic shock ([place]) Active Problems:   Sepsis due to Salmonella species with acute renal failure and septic shock ([place])   AKI (acute kidney injury) ([place])   Acute metabolic encephalopathy    HPI: SONG GARRIS is a 71 y.o. male with hx of CAD, s/p pci, defibrillator, GERD, DM, HCC s/p radiation, admitted on 6/6 after being found down at home, disoriented, He was found to be hypotensive with LA at 2.4. febrile up to 101F, started on vasopressors. His family reports he was feeling unwell roughly 4 days prior to admission. He was following up regularly with pcp, being managed for LPB associated with L1-L2 compression fracture sustained in early January when his defbrillator fired and caused GLF. THe patient denies overt fevers, no nausea and vomiting/diarrhea, no signs of food poisoning, no pet turtles. He was admitted to icu started on vanco/cefepime/metronidazole. Labs were concern for leukocytosis of 32K, with left shift, AKI with cr 2.2 up from 1.1, alp of 655/AST 122/ALT 63 and tbili 2.8. He did have recent mri on 5/18 of spine that did not show any discitis. His blood cx BCID identified salmonella. Patient is now alert answering questions appropriately. Underwent bedside TTE. Imaging thus far has identified RLL PE plus liver lesion that is likely sequelae of HCC.  ID consulted to help identify source of infection and management.  Past Medical History:  Diagnosis Date   Acute respiratory failure with hypoxia ([place]) 06/2016   CAD (coronary artery disease)    a. Remote PCI of the RCA;  b. 03/2013 Inf STEMI/PCI: LAD 60-70d, RCA 99p ISR(rota/CBA/DES);  c. 09/2014 MV: inf and infsept infarct, EF 35-40%;  d. 09/2014 Cath: patent RCA stent, stable LAD dzs, EF 40%-->Med rx.   CHF (congestive heart failure) ([place])    Diabetes mellitus     sees Dr. Hardin Negus in [place]   GERD (gastroesophageal reflux disease)    HFrEF (heart failure with reduced ejection fraction) ([place])    Hyperlipidemia    Hypertension    Ischemic cardiomyopathy    a. 09/2014 EF 40% by V gram;  b. 06/2016 Echo: EF 40, diff HK, Gr1 DD, inf HK, no mural thrombus. Mild MR, mildly dil LA, mildly dil RV.    LBBB (left bundle branch block)    Old   Liver mass    Morbid obesity ([facility])    Peripheral neuropathy    Upper GI bleed    Vertigo     Allergies: No Known Allergies   MEDICATIONS:  Chlorhexidine Gluconate Cloth  6 each Topical Daily   [START ON 07/17/2021] clopidogrel  75 mg Oral Daily   insulin aspart  0-9 Units Subcutaneous Q4H   sodium bicarbonate  100 mEq Intravenous Once   sodium chloride flush  10-40 mL Intracatheter Q12H    Social History   Tobacco Use   Smoking status: Former    Packs/day: 3.00    Years: 40.00    Pack years: 120.00    Types: Cigarettes    Quit date: 04/05/1995    Years since quitting: 26.2   Smokeless tobacco: Former   Tobacco comments:    started smoking at age 64  Vaping Use   Vaping Use: Never used  Substance Use Topics   Alcohol use: No    Comment: Occassional Use  Drug use: No    Family History  Problem Relation Age of Onset   Coronary artery disease Father    Coronary artery disease Brother    Lung cancer Brother    Breast cancer Sister    Prostate cancer Brother    Stroke Sister      Review of Systems  Constitutional: positivie for fever, chills, diaphoresis, activity change, appetite change, fatigue and unexpected weight change.  HENT: Negative for congestion, sore throat, rhinorrhea, sneezing, trouble swallowing and sinus pressure.  Eyes: Negative for photophobia and visual disturbance.  Respiratory: Negative for cough, chest tightness, shortness of breath, wheezing and stridor.  Cardiovascular: Negative for chest pain, palpitations and leg swelling.  Gastrointestinal: Negative for nausea,  vomiting, abdominal pain, diarrhea, constipation, blood in stool, abdominal distention and anal bleeding.  Genitourinary: Negative for dysuria, hematuria, flank pain and difficulty urinating.  Musculoskeletal: + back pain. Negative for myalgias,  joint swelling, arthralgias and gait problem.  Skin: Negative for color change, pallor, rash and wound.  Neurological: Negative for dizziness, tremors, weakness and light-headedness.  Hematological: Negative for adenopathy. Does not bruise/bleed easily.  Psychiatric/Behavioral: Negative for behavioral problems, confusion, sleep disturbance, dysphoric mood, decreased concentration and agitation.    OBJECTIVE: Temp:  [97.6 F (36.4 C)-101.2 F (38.4 C)] 97.6 F (36.4 C) (06/07 1127) Pulse Rate:  [77-116] 89 (06/07 1400) Resp:  [11-29] 20 (06/07 1400) BP: (56-137)/(44-92) 90/62 (06/07 1400) SpO2:  [95 %-100 %] 100 % (06/07 1400) Weight:  [95 kg] 95 kg (06/06 2355) Physical Exam  Constitutional:  oriented to person, place, and time. appears well-developed and well-nourished. No distress.  HENT: [place]/AT, PERRLA, no scleral icterus Mouth/Throat: Oropharynx is clear and moist. No oropharyngeal exudate.  Cardiovascular: Normal rate, regular rhythm and normal heart sounds. Exam reveals no gallop and no friction rub.  No murmur heard.  Pulmonary/Chest: Effort normal and breath sounds normal. No respiratory distress.  has no wheezes.  Neck = supple, no nuchal rigidity. Right ij with bleeding under dressing. Abdominal: Soft. Bowel sounds are normal.  exhibits no distension. There is no tenderness.  Lymphadenopathy: no cervical adenopathy. No axillary adenopathy Neurological: alert and oriented to person, place, and time.  Skin: Skin is warm and dry. Scattered echymosis Psychiatric: a normal mood and affect.  behavior is normal.    LABS: Results for orders placed or performed during the hospital encounter of 07/27/2021 (from the past 48 hour(s))  CBG  monitoring, ED     Status: Abnormal   Collection Time: 07/22/2021  6:44 PM  Result Value Ref Range   Glucose-Capillary 116 (H) 70 - 99 mg/dL    Comment: Glucose reference range applies only to samples taken after fasting for at least 8 hours.  Lactic acid, plasma     Status: Abnormal   Collection Time: 08/06/2021  7:15 PM  Result Value Ref Range   Lactic Acid, Venous 8.9 (HH) 0.5 - 1.9 mmol/L    Comment: CRITICAL RESULT CALLED TO, READ BACK BY AND VERIFIED WITH: C.LOZER,RN @2011  07/27/2021 [email or web address] Performed at [facility], [place] [address] [place], [place] [address]   Comprehensive metabolic panel     Status: Abnormal   Collection Time: 07/18/2021  7:15 PM  Result Value Ref Range   Sodium 136 135 - 145 mmol/L   Potassium 5.0 3.5 - 5.1 mmol/L   Chloride 102 98 - 111 mmol/L   CO2 14 (L) 22 - 32 mmol/L   Glucose, Bld 133 (H) 70 - 99 mg/dL  Comment: Glucose reference range applies only to samples taken after fasting for at least 8 hours.   BUN 28 (H) 8 - 23 mg/dL   Creatinine, Ser 2.14 (H) 0.61 - 1.24 mg/dL   Calcium 8.6 (L) 8.9 - 10.3 mg/dL   Total Protein 6.1 (L) 6.5 - 8.1 g/dL   Albumin 2.2 (L) 3.5 - 5.0 g/dL   AST 103 (H) 15 - 41 U/L   ALT 64 (H) 0 - 44 U/L   Alkaline Phosphatase 655 (H) 38 - 126 U/L   Total Bilirubin 2.8 (H) 0.3 - 1.2 mg/dL   GFR, Estimated 32 (L) >60 mL/min    Comment: (NOTE) Calculated using the CKD-EPI Creatinine Equation (2021)    Anion gap 20 (H) 5 - 15    Comment: Performed at [facility], [place] [address] [place], [place] [address]  CBC with Differential     Status: Abnormal   Collection Time: 08/02/2021  7:15 PM  Result Value Ref Range   WBC 6.5 4.0 - 10.5 K/uL   RBC 5.18 4.22 - 5.81 MIL/uL   Hemoglobin 17.0 13.0 - 17.0 g/dL   HCT 52.4 (H) 39.0 - 52.0 %   MCV 101.2 (H) 80.0 - 100.0 fL   MCH 32.8 26.0 - 34.0 pg   MCHC 32.4 30.0 - 36.0 g/dL   RDW 15.6 (H) 11.5 - 15.5 %   Platelets 258 150 - 400 K/uL   nRBC 0.5 (H) 0.0 - 0.2 %    Neutrophils Relative % 91 %   Neutro Abs 5.9 1.7 - 7.7 K/uL   Lymphocytes Relative 7 %   Lymphs Abs 0.5 (L) 0.7 - 4.0 K/uL   Monocytes Relative 1 %   Monocytes Absolute 0.1 0.1 - 1.0 K/uL   Eosinophils Relative 0 %   Eosinophils Absolute 0.0 0.0 - 0.5 K/uL   Basophils Relative 0 %   Basophils Absolute 0.0 0.0 - 0.1 K/uL   Immature Granulocytes 1 %   Abs Immature Granulocytes 0.09 (H) 0.00 - 0.07 K/uL    Comment: Performed at [facility] [address] [place], [place] [address]  Protime-INR     Status: Abnormal   Collection Time: 07/26/2021  7:15 PM  Result Value Ref Range   Prothrombin Time 17.6 (H) 11.4 - 15.2 seconds   INR 1.5 (H) 0.8 - 1.2    Comment: (NOTE) INR goal varies based on device and disease states. Performed at [facility], [place] [address] [place], [place] [address]   APTT     Status: None   Collection Time: 07/26/2021  7:15 PM  Result Value Ref Range   aPTT 30 24 - 36 seconds    Comment: Performed at [facility] [address] [place], [place] [address]  Blood Culture (routine x 2)     Status: None (Preliminary result)   Collection Time: 08/08/2021  7:15 PM   Specimen: BLOOD  Result Value Ref Range   Specimen Description BLOOD RIGHT ANTECUBITAL    Special Requests      BOTTLES DRAWN AEROBIC AND ANAEROBIC Blood Culture results may not be optimal due to an inadequate volume of blood received in culture bottles   Culture  Setup Time      GRAM NEGATIVE RODS ANAEROBIC BOTTLE ONLY Organism ID to follow CRITICAL RESULT CALLED TO, READ BACK BY AND VERIFIED WITH: J. FRENS [name], AT 5102 07/16/21 D. VANHOOK Performed at [facility], [place] [address],  [place], [place] [address]    Culture GRAM NEGATIVE RODS    Report Status PENDING   CK     Status: None   Collection Time: 08/08/2021  7:15 PM  Result Value Ref Range   Total CK 114 49 - 397 U/L    Comment: Performed at [facility], [place] [address], [place], [place] [address]  Blood  Culture ID Panel (Reflexed)     Status: Abnormal   Collection Time: 08/01/2021  7:15 PM  Result Value Ref Range   Enterococcus faecalis NOT DETECTED NOT DETECTED   Enterococcus Faecium NOT DETECTED NOT DETECTED   Listeria monocytogenes NOT DETECTED NOT DETECTED   Staphylococcus species NOT DETECTED NOT DETECTED   Staphylococcus aureus (BCID) NOT DETECTED NOT DETECTED   Staphylococcus epidermidis NOT DETECTED NOT DETECTED   Staphylococcus lugdunensis NOT DETECTED NOT DETECTED   Streptococcus species NOT DETECTED NOT DETECTED   Streptococcus agalactiae NOT DETECTED NOT DETECTED   Streptococcus pneumoniae NOT DETECTED NOT DETECTED   Streptococcus pyogenes NOT DETECTED NOT DETECTED   A.calcoaceticus-baumannii NOT DETECTED NOT DETECTED   Bacteroides fragilis NOT DETECTED NOT DETECTED   Enterobacterales DETECTED (A) NOT DETECTED    Comment: Enterobacterales represent a large order of gram negative bacteria, not a single organism. CRITICAL RESULT CALLED TO, READ BACK BY AND VERIFIED WITH: J. FRENS [name], AT 9528 07/16/21 BY D. VANHOOK    Enterobacter cloacae complex NOT DETECTED NOT DETECTED   Escherichia coli NOT DETECTED NOT DETECTED   Klebsiella aerogenes NOT DETECTED NOT DETECTED   Klebsiella oxytoca NOT DETECTED NOT DETECTED   Klebsiella pneumoniae NOT DETECTED NOT DETECTED   Proteus species NOT DETECTED NOT DETECTED   Salmonella species DETECTED (A) NOT DETECTED    Comment: CRITICAL RESULT CALLED TO, READ BACK BY AND VERIFIED WITH: J. FRENS [name], AT 4132 07/16/21 BY D. VANHOOK    Serratia marcescens NOT DETECTED NOT DETECTED   Haemophilus influenzae NOT DETECTED NOT DETECTED   Neisseria meningitidis NOT DETECTED NOT DETECTED   Pseudomonas aeruginosa NOT DETECTED NOT DETECTED   Stenotrophomonas maltophilia NOT DETECTED NOT DETECTED   Candida albicans NOT DETECTED NOT DETECTED   Candida auris NOT DETECTED NOT DETECTED   Candida glabrata NOT DETECTED NOT DETECTED   Candida krusei NOT  DETECTED NOT DETECTED   Candida parapsilosis NOT DETECTED NOT DETECTED   Candida tropicalis NOT DETECTED NOT DETECTED   Cryptococcus neoformans/gattii NOT DETECTED NOT DETECTED   CTX-M ESBL NOT DETECTED NOT DETECTED   Carbapenem resistance IMP NOT DETECTED NOT DETECTED   Carbapenem resistance KPC NOT DETECTED NOT DETECTED   Carbapenem resistance NDM NOT DETECTED NOT DETECTED   Carbapenem resist OXA 48 LIKE NOT DETECTED NOT DETECTED   Carbapenem resistance VIM NOT DETECTED NOT DETECTED    Comment: Performed at [facility] [address], [place], [place] [address]  Blood Culture (routine x 2)     Status: None (Preliminary result)   Collection Time: 07/10/2021  7:18 PM   Specimen: BLOOD  Result Value Ref Range   Specimen Description BLOOD BLOOD LEFT HAND    Special Requests      BOTTLES DRAWN AEROBIC AND ANAEROBIC Blood Culture results may not be optimal due to an inadequate volume of blood received in culture bottles   Culture  Setup Time      GRAM NEGATIVE RODS ANAEROBIC BOTTLE ONLY Performed at [facility], [place] [address], [place],  [address]    Culture GRAM NEGATIVE RODS  Report Status PENDING   Resp Panel by RT-PCR (Flu A&B, Covid) Anterior Nasal Swab     Status: None   Collection Time: 08/01/2021  7:44 PM   Specimen: Anterior Nasal Swab  Result Value Ref Range   SARS Coronavirus 2 by RT PCR NEGATIVE NEGATIVE    Comment: (NOTE) SARS-CoV-2 target nucleic acids are NOT DETECTED.  The SARS-CoV-2 RNA is generally detectable in upper respiratory specimens during the acute phase of infection. The lowest concentration of SARS-CoV-2 viral copies this assay can detect is 138 copies/mL. A negative result does not preclude SARS-Cov-2 infection and should not be used as the sole basis for treatment or other patient management decisions. A negative result may occur with  improper specimen collection/handling, submission of specimen other than nasopharyngeal swab,  presence of viral mutation(s) within the areas targeted by this assay, and inadequate number of viral copies(<138 copies/mL). A negative result must be combined with clinical observations, patient history, and epidemiological information. The expected result is Negative.  Fact Sheet for Patients:  EntrepreneurPulse.com.au  Fact Sheet for Healthcare Providers:  IncredibleEmployment.be  This test is no t yet approved or cleared by the [place] FDA and  has been authorized for detection and/or diagnosis of SARS-CoV-2 by FDA under an Emergency Use Authorization (EUA). This EUA will remain  in effect (meaning this test can be used) for the duration of the COVID-19 declaration under Section 564(b)(1) of the Act, 21 U.S.C.section 360bbb-3(b)(1), unless the authorization is terminated  or revoked sooner.       Influenza A by PCR NEGATIVE NEGATIVE   Influenza B by PCR NEGATIVE NEGATIVE    Comment: (NOTE) The Xpert Xpress SARS-CoV-2/FLU/RSV plus assay is intended as an aid in the diagnosis of influenza from Nasopharyngeal swab specimens and should not be used as a sole basis for treatment. Nasal washings and aspirates are unacceptable for Xpert Xpress SARS-CoV-2/FLU/RSV testing.  Fact Sheet for Patients: EntrepreneurPulse.com.au  Fact Sheet for Healthcare Providers: IncredibleEmployment.be  This test is not yet approved or cleared by the [place] FDA and has been authorized for detection and/or diagnosis of SARS-CoV-2 by FDA under an Emergency Use Authorization (EUA). This EUA will remain in effect (meaning this test can be used) for the duration of the COVID-19 declaration under Section 564(b)(1) of the Act, 21 U.S.C. section 360bbb-3(b)(1), unless the authorization is terminated or revoked.  Performed at [facility], [place] [address], [place], [place] [address]   Lactic acid, plasma      Status: Abnormal   Collection Time: 07/14/2021  8:39 PM  Result Value Ref Range   Lactic Acid, Venous >9.0 (HH) 0.5 - 1.9 mmol/L    Comment: CRITICAL VALUE NOTED.  VALUE IS CONSISTENT WITH PREVIOUSLY REPORTED AND CALLED VALUE. Performed at [facility], [place] [address] [place], [place] [address]   Ammonia     Status: Abnormal   Collection Time: 07/24/2021  8:39 PM  Result Value Ref Range   Ammonia 67 (H) 9 - 35 umol/L    Comment: Performed at [facility], [place] [address] [place], [place] [address]  I-Stat venous blood gas, ED     Status: Abnormal   Collection Time: 07/21/2021  8:51 PM  Result Value Ref Range   pH, Ven 7.466 (H) 7.25 - 7.43   pCO2, Ven 22.6 (L) 44 - 60 mmHg   pO2, Ven 97 (H) 32 - 45 mmHg   Bicarbonate 16.3 (L) 20.0 - 28.0 mmol/L  TCO2 17 (L) 22 - 32 mmol/L   O2 Saturation 98 %   Acid-base deficit 5.0 (H) 0.0 - 2.0 mmol/L   Sodium 133 (L) 135 - 145 mmol/L   Potassium 6.5 (HH) 3.5 - 5.1 mmol/L   Calcium, Ion 0.94 (L) 1.15 - 1.40 mmol/L   HCT 46.0 39.0 - 52.0 %   Hemoglobin 15.6 13.0 - 17.0 g/dL   Sample type VENOUS    Comment NOTIFIED PHYSICIAN   Glucose, capillary     Status: Abnormal   Collection Time: 07/28/2021 11:35 PM  Result Value Ref Range   Glucose-Capillary 100 (H) 70 - 99 mg/dL    Comment: Glucose reference range applies only to samples taken after fasting for at least 8 hours.   Comment 1 Notify RN   MRSA Next Gen by PCR, Nasal     Status: None   Collection Time: 08/02/2021 11:57 PM   Specimen: Nasal Mucosa; Nasal Swab  Result Value Ref Range   MRSA by PCR Next Gen NOT DETECTED NOT DETECTED    Comment: (NOTE) The GeneXpert MRSA Assay (FDA approved for NASAL specimens only), is one component of a comprehensive MRSA colonization surveillance program. It is not intended to diagnose MRSA infection nor to guide or monitor treatment for MRSA infections. Test performance is not FDA approved in patients less than 75 years old. Performed at [facility], [place] [address], [place], [place] [address]   Lactic acid, plasma     Status: Abnormal   Collection Time: 07/16/21 12:50 AM  Result Value Ref Range   Lactic Acid, Venous 7.4 (HH) 0.5 - 1.9 mmol/L    Comment: CRITICAL VALUE NOTED.  VALUE IS CONSISTENT WITH PREVIOUSLY REPORTED AND CALLED VALUE. Performed at [facility], [place] [address] [place], [place] [address]   Comprehensive metabolic panel     Status: Abnormal   Collection Time: 07/16/21 12:50 AM  Result Value Ref Range   Sodium 134 (L) 135 - 145 mmol/L   Potassium 3.9 3.5 - 5.1 mmol/L   Chloride 100 98 - 111 mmol/L   CO2 16 (L) 22 - 32 mmol/L   Glucose, Bld 117 (H) 70 - 99 mg/dL    Comment: Glucose reference range applies only to samples taken after fasting for at least 8 hours.   BUN 30 (H) 8 - 23 mg/dL   Creatinine, Ser 2.30 (H) 0.61 - 1.24 mg/dL   Calcium 8.0 (L) 8.9 - 10.3 mg/dL   Total Protein 5.2 (L) 6.5 - 8.1 g/dL   Albumin 1.9 (L) 3.5 - 5.0 g/dL   AST 122 (H) 15 - 41 U/L   ALT 63 (H) 0 - 44 U/L   Alkaline Phosphatase 550 (H) 38 - 126 U/L   Total Bilirubin 2.7 (H) 0.3 - 1.2 mg/dL   GFR, Estimated 29 (L) >60 mL/min    Comment: (NOTE) Calculated using the CKD-EPI Creatinine Equation (2021)    Anion gap 18 (H) 5 - 15    Comment: Performed at [facility], [place] [address], [place] [address]  CBC     Status: Abnormal   Collection Time: 07/16/21 12:50 AM  Result Value Ref Range   WBC 32.7 (H) 4.0 - 10.5 K/uL   RBC 4.58 4.22 - 5.81 MIL/uL   Hemoglobin 15.4 13.0 - 17.0 g/dL   HCT 45.1 39.0 - 52.0 %   MCV 98.5 80.0 - 100.0 fL   MCH 33.6 26.0 - 34.0 pg  MCHC 34.1 30.0 - 36.0 g/dL   RDW 15.6 (H) 11.5 - 15.5 %   Platelets 225 150 - 400 K/uL   nRBC 0.2 0.0 - 0.2 %    Comment: Performed at [facility], [place] [address] [place], [place] [address]  Hemoglobin A1c     Status: None   Collection Time: 07/16/21 12:50 AM  Result Value Ref Range   Hgb A1c MFr Bld 5.6 4.8 - 5.6 %     Comment: (NOTE) Pre diabetes:          5.7%-6.4%  Diabetes:              >6.4%  Glycemic control for   <7.0% adults with diabetes    Mean Plasma Glucose 114.02 mg/dL    Comment: Performed at [facility] [address], [place], [place] [address]  Glucose, capillary     Status: Abnormal   Collection Time: 07/16/21  4:09 AM  Result Value Ref Range   Glucose-Capillary 144 (H) 70 - 99 mg/dL    Comment: Glucose reference range applies only to samples taken after fasting for at least 8 hours.   Comment 1 Notify RN   Lactic acid, plasma     Status: Abnormal   Collection Time: 07/16/21  5:00 AM  Result Value Ref Range   Lactic Acid, Venous 6.6 (HH) 0.5 - 1.9 mmol/L    Comment: CRITICAL VALUE NOTED.  VALUE IS CONSISTENT WITH PREVIOUSLY REPORTED AND CALLED VALUE. Performed at [facility], [place] [address], [place], [place] [address]   Glucose, capillary     Status: Abnormal   Collection Time: 07/16/21  7:19 AM  Result Value Ref Range   Glucose-Capillary 175 (H) 70 - 99 mg/dL    Comment: Glucose reference range applies only to samples taken after fasting for at least 8 hours.  Urinalysis, Routine w reflex microscopic     Status: Abnormal   Collection Time: 07/16/21 10:57 AM  Result Value Ref Range   Color, Urine YELLOW YELLOW   APPearance HAZY (A) CLEAR   Specific Gravity, Urine 1.021 1.005 - 1.030   pH 5.0 5.0 - 8.0   Glucose, UA 150 (A) NEGATIVE mg/dL   Hgb urine dipstick MODERATE (A) NEGATIVE   Bilirubin Urine NEGATIVE NEGATIVE   Ketones, ur NEGATIVE NEGATIVE mg/dL   Protein, ur NEGATIVE NEGATIVE mg/dL   Nitrite NEGATIVE NEGATIVE   Leukocytes,Ua TRACE (A) NEGATIVE   RBC / HPF 0-5 0 - 5 RBC/hpf   WBC, UA 6-10 0 - 5 WBC/hpf   Bacteria, UA FEW (A) NONE SEEN   Mucus PRESENT    Budding Yeast PRESENT    Hyaline Casts, UA PRESENT     Comment: Performed at [facility], [address] [place], [place] [address]  Glucose, capillary     Status: Abnormal    Collection Time: 07/16/21 11:25 AM  Result Value Ref Range   Glucose-Capillary 194 (H) 70 - 99 mg/dL    Comment: Glucose reference range applies only to samples taken after fasting for at least 8 hours.  Lactic acid, plasma     Status: Abnormal   Collection Time: 07/16/21 12:05 PM  Result Value Ref Range   Lactic Acid, Venous 6.1 (HH) 0.5 - 1.9 mmol/L    Comment: CRITICAL VALUE NOTED.  VALUE IS CONSISTENT WITH PREVIOUSLY REPORTED AND CALLED VALUE. Performed at [facility], [place] [address], [place], [place] [address]   Heparin level (unfractionated)  Status: None   Collection Time: 07/16/21 12:05 PM  Result Value Ref Range   Heparin Unfractionated 0.35 0.30 - 0.70 IU/mL    Comment: (NOTE) The clinical reportable range upper limit is being lowered to >1.10 to align with the FDA approved guidance for the current laboratory assay.  If heparin results are below expected values, and patient dosage has  been confirmed, suggest follow up testing of antithrombin III levels. Performed at [facility], [place] [address], [place], [place] [address]     MICRO:  IMAGING: CT HEAD WO CONTRAST (5MM)  Result Date: 08/05/2021 CLINICAL DATA:  Status post fall. EXAM: CT HEAD WITHOUT CONTRAST TECHNIQUE: Contiguous axial images were obtained from the base of the skull through the vertex without intravenous contrast. RADIATION DOSE REDUCTION: This exam was performed according to the departmental dose-optimization program which includes automated exposure control, adjustment of the mA and/or kV according to patient size and/or use of iterative reconstruction technique. COMPARISON:  April 12, 2011 FINDINGS: Brain: There is mild cerebral atrophy with widening of the extra-axial spaces and ventricular dilatation. There are areas of decreased attenuation within the white matter tracts of the supratentorial brain, consistent with microvascular disease changes. Vascular: No hyperdense vessel or unexpected  calcification. Skull: Normal. Negative for fracture or focal lesion. Sinuses/Orbits: No acute finding. Other: None. IMPRESSION: 1. No acute intracranial abnormality. 2. Generalized cerebral atrophy with chronic white matter small vessel ischemic changes. Electronically Signed   By: Virgina Norfolk M.D.   On: 07/30/2021 19:19   CT CHEST W CONTRAST  Result Date: 07/16/2021 CLINICAL DATA:  Sepsis. EXAM: CT CHEST WITH CONTRAST TECHNIQUE: Multidetector CT imaging of the chest was performed during intravenous contrast administration. RADIATION DOSE REDUCTION: This exam was performed according to the departmental dose-optimization program which includes automated exposure control, adjustment of the mA and/or kV according to patient size and/or use of iterative reconstruction technique. CONTRAST:  87m OMNIPAQUE IOHEXOL 350 MG/ML SOLN COMPARISON:  02/14/2019, 01/06/2021, 06/26/2021. FINDINGS: Cardiovascular: Heart is enlarged and there is a trace pericardial effusion. Multi-vessel coronary artery calcifications are noted. Pacemaker leads are noted in the heart. There is a right internal jugular central venous catheter terminating in the superior vena cava. There is atherosclerotic calcification of the aorta without evidence of aneurysm. The pulmonary trunk is normal in caliber. Mediastinum/Nodes: No mediastinal, hilar, or axillary lymphadenopathy. Thyroid gland, trachea, and esophagus are within normal limits. Lungs/Pleura: Emphysematous changes are noted in the lungs. Mild atelectasis is noted bilaterally. No effusion or pneumothorax. A 1.2 cm nodule is noted in the left lower lobe, axial image 54. There is a 2 mm subpleural nodule in the right upper lobe, axial image 54, unchanged. There is a 7 mm subpleural nodule in the left lower lobe, axial image 75. Upper Abdomen: The liver has a nodular contour, compatible with cirrhosis. An ill-defined hypodense region is noted in the anterior right lobe of the liver measuring  8.3 x 5.3 cm. Additional hypodense and hyperdense lesions are noted in the liver. Stones are present in the gallbladder. A small amount of ascites is noted in the right upper quadrant. Musculoskeletal: Gynecomastia is noted bilaterally. Pacemaker device is present in the anterior chest wall on the left. Degenerative changes are present in the thoracic spine. There is a new compression deformity at L1 unchanged from prior exams. There is stable bony deformity of the T6 and T7 vertebral bodies. IMPRESSION: 1. No acute process in the chest. 2. New pulmonary nodules in the left lower  lobe measuring up to 1.2 cm. Consider one of the following in 3 months for both low-risk and high-risk individuals: (a) repeat chest CT, (b) follow-up PET-CT, or (c) tissue sampling. This recommendation follows the consensus statement: Guidelines for Management of Incidental Pulmonary Nodules Detected on CT Images: From the Fleischner Society 2017; Radiology 2017; 284:228-243. 3. Cirrhotic changes in the liver. There is an ill-defined hypodense region in the anterior right lobe of the liver, concerning for hepatocellular carcinoma. A few scattered hypodense and hypervascular lesions are present in the liver which are indeterminate. MRI is recommended for further evaluation. 4. Cholelithiasis. 5. Aortic atherosclerosis and coronary artery calcifications. Electronically Signed   By: Brett Fairy M.D.   On: 07/16/2021 03:19   CT Cervical Spine Wo Contrast  Result Date: 07/30/2021 CLINICAL DATA:  Status post fall. EXAM: CT CERVICAL SPINE WITHOUT CONTRAST TECHNIQUE: Multidetector CT imaging of the cervical spine was performed without intravenous contrast. Multiplanar CT image reconstructions were also generated. RADIATION DOSE REDUCTION: This exam was performed according to the departmental dose-optimization program which includes automated exposure control, adjustment of the mA and/or kV according to patient size and/or use of iterative  reconstruction technique. COMPARISON:  None Available. FINDINGS: Alignment: Normal. Skull base and vertebrae: No acute fracture. A benign-appearing bone island is seen within the lamina of the T1 vertebral body on the left. Soft tissues and spinal canal: No prevertebral fluid or swelling. No visible canal hematoma. Disc levels: There is moderate to marked severity endplate sclerosis the levels of C5-C6 and C6-C7 with mild anterior osteophyte formation noted at the levels of C4-C5, C5-C6, C6-C7 and C7-T1. There is marked severity narrowing of the anterior atlantoaxial articulation with marked severity intervertebral disc space narrowing seen at C5-C6 and C6-C7. Mild intervertebral disc space narrowing is present throughout the remainder of the cervical spine. Bilateral moderate severity multilevel facet joint hypertrophy is noted. Upper chest: Negative. Other: None. IMPRESSION: 1. No acute fracture or subluxation of the cervical spine. 2. Marked severity degenerative changes at the levels of C5-C6 and C6-C7. Electronically Signed   By: Virgina Norfolk M.D.   On: 08/04/2021 19:22   DG CHEST PORT 1 VIEW  Result Date: 07/16/2021 CLINICAL DATA:  Central line placement EXAM: PORTABLE CHEST 1 VIEW COMPARISON:  07/24/2021 FINDINGS: Right IJ approach central venous catheter tip is in the lower SVC. Unchanged position of AICD leads. Mild cardiomegaly without acute airspace disease. IMPRESSION: Right IJ approach central venous catheter tip in the lower SVC. Electronically Signed   By: Ulyses Jarred M.D.   On: 07/16/2021 00:46   DG Chest Port 1 View  Result Date: 07/17/2021 CLINICAL DATA:  Suspected sepsis, initial encounter EXAM: PORTABLE CHEST 1 VIEW COMPARISON:  02/25/2021 FINDINGS: Cardiac shadow is enlarged. Defibrillator is again seen and stable. Aortic calcifications are noted. Lungs are hypoinflated but clear. No acute bony abnormality is seen. Increased density is noted over the apices consistent with overlying  soft tissue density. IMPRESSION: No acute abnormality noted. Electronically Signed   By: Inez Catalina M.D.   On: 08/06/2021 19:24   ECHOCARDIOGRAM COMPLETE  Result Date: 07/16/2021    ECHOCARDIOGRAM REPORT   Patient Name:   PARKER WHERLEY Deerman Date of Exam: 07/16/2021 Medical Rec #:  [id number]      Height:       65.5 in Accession #:    [id number]     Weight:       209.4 lb Date of Birth:  07/13/1950      BSA:  2.029 m Patient Age:    62 years       BP:           94/69 mmHg Patient Gender: M              HR:           79 bpm. Exam Location:  Inpatient Procedure: 2D Echo, Cardiac Doppler, Color Doppler and Intracardiac            Opacification Agent Indications:    Shock  History:        Patient has prior history of Echocardiogram examinations. CAD;                 Risk Factors:Hypertension and Diabetes.  Sonographer:    Jyl Heinz Referring Phys: [id number] [place]  1. Left ventricular ejection fraction, by estimation, is 30 to 35%. The left ventricle has moderately decreased function. The left ventricle demonstrates global hypokinesis that appears slightly worse in the basal-to-mid inferoseptum. Diastolic function  indeterminant due to Afib.  2. Right ventricular systolic function is normal. The right ventricular size is mildly enlarged. There is normal pulmonary artery systolic pressure. The estimated right ventricular systolic pressure is 20.2 mmHg.  3. Left atrial size was moderately dilated.  4. The mitral valve is grossly normal. Trivial mitral valve regurgitation. No evidence of mitral stenosis.  5. The aortic valve is tricuspid. There is mild calcification of the aortic valve. There is mild thickening of the aortic valve. Aortic valve regurgitation is not visualized. Aortic valve sclerosis/calcification is present, without any evidence of aortic stenosis.  6. The inferior vena cava is normal in size with greater than 50% respiratory variability, suggesting right atrial pressure of 3  mmHg. Comparison(s): Compared to prior TTE on 08/2020, the LVEF has dropped from 50-55% to 30-35% and patient is now in afib. FINDINGS  Left Ventricle: Left ventricular ejection fraction, by estimation, is 30 to 35%. The left ventricle has moderately decreased function. The left ventricle demonstrates global hypokinesis. Definity contrast agent was given IV to delineate the left ventricular endocardial borders. The left ventricular internal cavity size was normal in size. There is no left ventricular hypertrophy. Diastolic function indeterminant due to Afib. Right Ventricle: The right ventricular size is mildly enlarged. No increase in right ventricular wall thickness. Right ventricular systolic function is normal. There is normal pulmonary artery systolic pressure. The tricuspid regurgitant velocity is 2.82  m/s, and with an assumed right atrial pressure of 3 mmHg, the estimated right ventricular systolic pressure is 54.2 mmHg. Left Atrium: Left atrial size was moderately dilated. Right Atrium: Right atrial size was normal in size. Pericardium: There is no evidence of pericardial effusion. Mitral Valve: The mitral valve is grossly normal. There is mild thickening of the mitral valve leaflet(s). There is mild calcification of the mitral valve leaflet(s). Mild to moderate mitral annular calcification. Trivial mitral valve regurgitation. No evidence of mitral valve stenosis. Tricuspid Valve: The tricuspid valve is normal in structure. Tricuspid valve regurgitation is trivial. Aortic Valve: The aortic valve is tricuspid. There is mild calcification of the aortic valve. There is mild thickening of the aortic valve. Aortic valve regurgitation is not visualized. Aortic valve sclerosis/calcification is present, without any evidence of aortic stenosis. Aortic valve peak gradient measures 8.4 mmHg. Pulmonic Valve: The pulmonic valve was normal in structure. Pulmonic valve regurgitation is not visualized. Aorta: The aortic  root and ascending aorta are structurally normal, with no evidence of dilitation. Venous: The inferior  vena cava is normal in size with greater than 50% respiratory variability, suggesting right atrial pressure of 3 mmHg. IAS/Shunts: The atrial septum is grossly normal. Additional Comments: A device lead is visualized.  LEFT VENTRICLE PLAX 2D LVIDd:         4.40 cm      Diastology LVIDs:         3.50 cm      LV e' medial:    6.42 cm/s LV PW:         1.10 cm      LV E/e' medial:  13.9 LV IVS:        1.10 cm      LV e' lateral:   8.81 cm/s LVOT diam:     2.20 cm      LV E/e' lateral: 10.1 LV SV:         70 LV SV Index:   34 LVOT Area:     3.80 cm  LV Volumes (MOD) LV vol d, MOD A2C: 160.0 ml LV vol d, MOD A4C: 118.0 ml LV vol s, MOD A2C: 105.0 ml LV vol s, MOD A4C: 79.8 ml LV SV MOD A2C:     55.0 ml LV SV MOD A4C:     118.0 ml LV SV MOD BP:      45.0 ml RIGHT VENTRICLE             IVC RV Basal diam:  3.40 cm     IVC diam: 1.90 cm RV Mid diam:    3.40 cm RV S prime:     14.30 cm/s TAPSE (M-mode): 2.0 cm LEFT ATRIUM             Index        RIGHT ATRIUM           Index LA diam:        4.80 cm 2.37 cm/m   RA Area:     19.60 cm LA Vol (A2C):   87.4 ml 43.08 ml/m  RA Volume:   58.20 ml  28.69 ml/m LA Vol (A4C):   81.7 ml 40.27 ml/m LA Biplane Vol: 84.5 ml 41.65 ml/m  AORTIC VALVE AV Area (Vmax): 2.99 cm AV Vmax:        145.00 cm/s AV Peak Grad:   8.4 mmHg LVOT Vmax:      114.00 cm/s LVOT Vmean:     81.000 cm/s LVOT VTI:       0.184 m  AORTA Ao Root diam: 3.10 cm Ao Asc diam:  2.90 cm MITRAL VALVE               TRICUSPID VALVE MV Area (PHT): 3.85 cm    TR Peak grad:   31.8 mmHg MV Decel Time: 197 msec    TR Vmax:        282.00 cm/s MR Peak grad: 88.9 mmHg MR Mean grad: 68.0 mmHg    SHUNTS MR Vmax:      471.50 cm/s  Systemic VTI:  0.18 m MR Vmean:     404.0 cm/s   Systemic Diam: 2.20 cm MV E velocity: 89.40 cm/s Gwyndolyn Kaufman MD Electronically signed by Gwyndolyn Kaufman MD Signature Date/Time: 07/16/2021/1:29:57  PM    Final    CT Angio Abd/Pel W and/or Wo Contrast  Addendum Date: 07/16/2021   ADDENDUM REPORT: 07/16/2021 03:54 ADDENDUM: Critical Value/emergent results were called by telephone at the time of interpretation on 07/16/2021 at 3:50 am to provider Dr. Lucile Shutters , who  verbally acknowledged these results. Electronically Signed   By: Ulyses Jarred M.D.   On: 07/16/2021 03:54   Result Date: 07/16/2021 CLINICAL DATA:  Elevated LFTs and sepsis EXAM: CTA ABDOMEN AND PELVIS WITHOUT AND WITH CONTRAST TECHNIQUE: Multidetector CT imaging of the abdomen and pelvis was performed using the standard protocol during bolus administration of intravenous contrast. Multiplanar reconstructed images and MIPs were obtained and reviewed to evaluate the vascular anatomy. RADIATION DOSE REDUCTION: This exam was performed according to the departmental dose-optimization program which includes automated exposure control, adjustment of the mA and/or kV according to patient size and/or use of iterative reconstruction technique. CONTRAST:  64m OMNIPAQUE IOHEXOL 350 MG/ML SOLN COMPARISON:  None Available. FINDINGS: VASCULAR Lower chest: There is a segmental pulmonary embolus in the right lower lobe. Aorta: Calcific aortic atherosclerosis.  No dissection or aneurysm. Celiac: Patent without evidence of aneurysm, dissection, vasculitis or significant stenosis. SMA: Patent without evidence of aneurysm, dissection, vasculitis or significant stenosis. Renals: Both renal arteries are patent without evidence of aneurysm, dissection, vasculitis, fibromuscular dysplasia or significant stenosis. IMA: Patent without evidence of aneurysm, dissection, vasculitis or significant stenosis. Inflow: Patent without evidence of aneurysm, dissection, vasculitis or significant stenosis. Proximal Outflow: Bilateral common femoral and visualized portions of the superficial and profunda femoral arteries are patent without evidence of aneurysm, dissection, vasculitis or  significant stenosis. Veins: No obvious venous abnormality within the limitations of this arterial phase study. Review of the MIP images confirms the above findings. NON-VASCULAR Hepatobiliary: There is an area of decreased enhancement on venous phase images in the right hepatic lobe, measuring 6.3 x 4.0 cm. This is superimposed on a background of hepatic cirrhosis. There is cholelithiasis without active inflammation. Pancreas: Unremarkable. No pancreatic ductal dilatation or surrounding inflammatory changes. Spleen: Normal in size without focal abnormality. Adrenals/Urinary Tract: Adrenal glands are unremarkable. Kidneys are normal, without renal calculi, focal lesion, or hydronephrosis. Bladder is unremarkable. Stomach/Bowel: Stomach is within normal limits. Appendix appears normal. No evidence of bowel wall thickening, distention, or inflammatory changes. Lymphatic: No enlarged abdominal or pelvic lymph nodes. Reproductive: Unremarkable prostate Other: No ascites Musculoskeletal: No acute or significant osseous findings. IMPRESSION: 1. A segmental pulmonary embolus in the right lower lobe. 2. There is an area of decreased enhancement on venous phase images in the right hepatic lobe, measuring 6.3 x 4.0 cm. This is superimposed on a background of hepatic cirrhosis. 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 A non emergent MRI with and without contrast, preferably as an outpatient, is recommended for follow-up. 3. Cholelithiasis without active inflammation. 4. Aortic Atherosclerosis (ICD10-I70.0). Electronically Signed: By: KUlyses JarredM.D. On: 07/16/2021 03:33     Assessment/Plan:  89yoM with hx of DM, CAD, HCC s/p radiation, admitted with gram negative sepsis due to salmonella c/w enteric fever rather than gastroenteritis. 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 Continue with anticoagulation  Leukocytosis = anticipate to trend downward since on appriopriate abtx  AKI = recommend repeat bmp tomorrow to see it trends downward  Mild transaminitis = not  significinantly elevated as in the past. Continue to monitor.  Elzie Rings [facility] [phone]

## 2021-07-16 NOTE — Progress Notes (Signed)
ANTICOAGULATION CONSULT NOTE  Pharmacy Consult for Heparin  Indication: pulmonary embolus  No Known Allergies  Patient Measurements: Weight: 95 kg (209 lb 7 oz) Heparin dosing weight = 83 kg  Vital Signs: Temp: 97.6 F (36.4 C) (06/07 1127) Temp Source: Oral (06/07 1127) BP: 102/76 (06/07 1245) Pulse Rate: 89 (06/07 1245)  Labs: Recent Labs    08/01/2021 1915 07/27/2021 2051 07/16/21 0050 07/16/21 1205  HGB 17.0 15.6 15.4  --   HCT 52.4* 46.0 45.1  --   PLT 258  --  225  --   APTT 30  --   --   --   LABPROT 17.6*  --   --   --   INR 1.5*  --   --   --   HEPARINUNFRC  --   --   --  0.35  CREATININE 2.14*  --  2.30*  --   CKTOTAL 114  --   --   --      Estimated Creatinine Clearance: 30.6 mL/min (A) (by C-G formula based on SCr of 2.3 mg/dL (H)).  Assessment: 71 y/o M with AMS from sepsis secondary to UTI vs wound.  Also found to have small PE on CTA and Pharmacy consulted to dose IV heparin.  Heparin level therapeutic and low normal; no bleeding reported. Baseline INR is 1.5 in setting of liver dysfunction.   Goal of Therapy:  Heparin level 0.3-0.7 units/ml Monitor platelets by anticoagulation protocol: Yes   Plan:  Increase heparin drip to 1300 units/hr Daily heparin level and CBC Monitor closely for s/sx of bleeding  Gilman Olazabal D. Mina Marble, [name], BCPS, [place] 07/16/2021, 1:06 PM

## 2021-07-16 NOTE — Progress Notes (Addendum)
NAME:  Devin Hanna, MRN:  [id number], DOB:  16-Mar-1950, LOS: 1 ADMISSION DATE:  07/10/2021, CONSULTATION DATE:  08/06/2021 REFERRING MD:  EDP, CHIEF COMPLAINT:  AMS/fall   History of Present Illness:  71 yo male with pmh cad, dm2, obesity, htn, pud, ckdIIIa, paf, cirrhosis, recently dx's hepatocellular carcinoma s/p radiation therapy presented today after being found by his neighbor on the bathroom floor unable to get up. Pt states he was urinating and when he finished he became slightly light headed and tripped over his feet vs pass out. He is unclear about the specifics. When EMS arrived however he estimated he had been on the floor for approx. 30 mins. He was aaox3 and complaining of back pain (which is also chronic). He states he has been in his usual state of health prior to this event.   While being transported to [facility] pt became more altered and upon arrival was unable to communicate his own name. Pt baseline is independent, walks with walker, aaox3. He was taken for cth which was negative for acute process. Pt's labs were markedly abnormal and multiple hours after presentation while his mental status was improving his BP dropped to systolics in 32'T. He was started on norepi and subsequently vasopressin to maintain BP. His lactate cont to climb and he was admitted to ICU.   Pt states he is feeling better since presentation. He denies recent illness, stating "ive felt ok" no n/v/d no urinary symptoms (despite reports of foul smelling urine when he was found and sediment upon our catheterization). Denies fevers, daughter at bedside did endorse he likely was having chills as it was hot 3-4 days ago and while she was sweating he was shivering under 2-3 blankets. She states he didn't feel necessarily warm but no temperature was taken. Pt states he has been compliant with all of his m edications, eating and drinking well. He denies any pain with exception of his back which is chronic.    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 rate 16, weight 95 kg, SpO2 99 %.        Intake/Output Summary (Last 24 hours) at 07/16/2021 0915 Last data filed at 07/16/2021 0800 Gross per 24 hour  Intake 2434.85 ml  Output --  Net 2434.85 ml   Filed Weights   07/25/2021 2355  Weight: 95 kg    Examination: General: In bed, NAD, appears comfortable HEENT: MM pink/moist, anicteric, eccymosis to left face Neuro: RASS 0, PERRL 64m, GCS 14, confused to year CV: S1S2, irregularly irregular, no m/r/g appreciated PULM:  air movement in all lobes, trachea midline, chest expansion symmetric GI: soft, bsx4 active, non-tender   Extremities: cool/dry, +2 pretibial edema, capillary refill greater than 3 seconds  Skin:  no rashes or lesions noted  Labs BC + for salmonella  UA/UC pending Lactate 9 > 7.4 > 6.6 A1C 5.6 WBC 6.5>32.7 Sodium 133 > 134 Potassium 5.0 > 3.9 Creatinine 2.4 >  2.3 (baseline 1.1-1.2), BUN 28 > 30 Albumin 1.9 AST 103 > 122, ALT 64 > 163, alk phos 655 > 550 Anion gap 18, CO2 16 VBG 6/6 7.46/22/97/16 Ammonia 67 COVID flu negative CK within normal limits 114 INR 1.5  Chest x-ray: Stable central line, no pneumothorax, no significant effusion or infiltrate noted CT head: No acute processes CT C-spine: Degenerative changes at level C5-C6 and C6-C7, no fracture CTA angio: Segmental pulmonary embolus in right lower lobe, cholelithiasis without active  inflammation.  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 Twelve-lead:Sinus tach, irregular, no significant ST changes appreciated   Resolved Hospital Problem list     Assessment & Plan:  Septic shock, secondary to Salmonella bacteremia Present on blood cultures 6/6. On levophed. Pressors weaning. -Goal MAP 65 or greater. Levophed ordered. Titrate medication to goal -S/P 3000 ml fluid resusitation. -Obtain/Follow up BC and UC -On Cefepime and Vancomycin. Narrowing to rocephin -Trend lactate -Obtain ECHO -We will consult infectious diseases  Pulmonary embolus, segmental, right lower lobe Suspect secondary to history of cancer, recent trauma -Continue heparin drip -We will plan to switch to Lovenox once AKI improves  Acute metabolic encephalopathy, suspect secondary to sepsis Head CT negative for acute process -Supportive care -Monitor neuro exam -Goal MAP 65  Acute kidney injury suspect prerenal secondary to sepsis and shock -Ensure renal perfusion. Goal MAP 65 or greater. -Avoid neprotoxic drugs as possible. -Strict I&O's -Follow up AM creatinine  Transaminitis Elevated alk phos Suspect chronically elevated since malignancy diagnosis -Follow LFTs -Supportive care   Metabolic/lactic acidosis pt initally was not hypotensive and his lactate cont to climb despite some ivf. Bicarb given in ED. Pressors weaning. -continue to trend lactic acid -supportive care -follow up AM labs  LE edema:  R>>L (does have hardware in R) -Follow-up venous duplexes  Fall cth and c-spine negative for acute process -PT/OT when appropriate  Hepatocellular carcinoma:  not surgical candidate had radiation beads placed with good response per chart reivew. There was potential areas of residual cancer, f/u imaging was to be in April but he has it rescheduled 2/2 inability to lay flat -Outpatient follow up -Per radiology consult concern for a pedis  cellular carcinoma, Rec outpatient MRI.  DM2 -Blood Glucose goal 140-180. -SSI  Chronic HFrEF Afib, not on AC Icd in place -Continue holding home meds due to hypotension  Best Practice (right click and "Reselect all SmartList Selections" daily)   Diet/type: clear DVT prophylaxis: SCD GI prophylaxis: PPI Lines: Central line and yes and it is still needed Foley:  Yes, and it is still needed Code Status:  full code Last date of multidisciplinary goals of care discussion [07/10/2021 with pt and daughter at bedside]  Critical care time: 61 min    [employer]., MSN, APRN, AGACNP-BC [facility]  07/16/2021 , 9:49 AM  Please see [email or web address] for pager details  If no response, please call [phone] After hours, please call Elink at [phone]

## 2021-07-16 NOTE — Progress Notes (Signed)
[facility] Progress Note Patient Name: Devin Hanna DOB: [birth year] MRN: [id number]   Date of Service  07/16/2021  HPI/Events of Note  Patient admitted with altered mental status, shock, severe lactic acidosis,  concern for urinary tract infection, but etiology is not yet definitively clarified, and work up is in progress.  eICU Interventions  New Patient Evaluation.        Kerry Kass Kailen Name 07/16/2021, 12:39 AM

## 2021-07-16 NOTE — Progress Notes (Signed)
eLink Physician-Brief Progress Note Patient Name: Devin Hanna DOB: Aug 25, 1950 MRN: [id number]   Date of Service  07/16/2021  HPI/Events of Note  Patient complains of back pain, he takes Vicodin for it at home.  eICU Interventions  PRN iv Dilaudid 0.5 mg Q 4 hours x 3 doses ordered.        Kerry Kass Dawnisha Marquina 07/16/2021, 4:52 AM

## 2021-07-17 ENCOUNTER — Inpatient Hospital Stay (HOSPITAL_COMMUNITY): Payer: PPO

## 2021-07-17 DIAGNOSIS — A419 Sepsis, unspecified organism: Secondary | ICD-10-CM | POA: Diagnosis not present

## 2021-07-17 DIAGNOSIS — G9341 Metabolic encephalopathy: Secondary | ICD-10-CM | POA: Diagnosis not present

## 2021-07-17 DIAGNOSIS — R6521 Severe sepsis with septic shock: Secondary | ICD-10-CM | POA: Diagnosis not present

## 2021-07-17 DIAGNOSIS — N179 Acute kidney failure, unspecified: Secondary | ICD-10-CM | POA: Diagnosis not present

## 2021-07-17 DIAGNOSIS — A021 Salmonella sepsis: Secondary | ICD-10-CM | POA: Diagnosis not present

## 2021-07-17 LAB — BASIC METABOLIC PANEL
Anion gap: 16 — ABNORMAL HIGH (ref 5–15)
BUN: 30 mg/dL — ABNORMAL HIGH (ref 8–23)
CO2: 18 mmol/L — ABNORMAL LOW (ref 22–32)
Calcium: 7.8 mg/dL — ABNORMAL LOW (ref 8.9–10.3)
Chloride: 99 mmol/L (ref 98–111)
Creatinine, Ser: 1.59 mg/dL — ABNORMAL HIGH (ref 0.61–1.24)
GFR, Estimated: 46 mL/min — ABNORMAL LOW (ref >60)
Glucose, Bld: 157 mg/dL — ABNORMAL HIGH (ref 70–99)
Potassium: 3.5 mmol/L (ref 3.5–5.1)
Sodium: 133 mmol/L — ABNORMAL LOW (ref 135–145)

## 2021-07-17 LAB — GLUCOSE, CAPILLARY
Glucose-Capillary: 134 mg/dL — ABNORMAL HIGH (ref 70–99)
Glucose-Capillary: 131 mg/dL — ABNORMAL HIGH (ref 70–99)
Glucose-Capillary: 157 mg/dL — ABNORMAL HIGH (ref 70–99)
Glucose-Capillary: 158 mg/dL — ABNORMAL HIGH (ref 70–99)
Glucose-Capillary: 186 mg/dL — ABNORMAL HIGH (ref 70–99)
Glucose-Capillary: 170 mg/dL — ABNORMAL HIGH (ref 70–99)

## 2021-07-17 LAB — HEPARIN LEVEL (UNFRACTIONATED)
Heparin Unfractionated: 0.12 IU/mL — ABNORMAL LOW (ref 0.30–0.70)
Heparin Unfractionated: 0.2 IU/mL — ABNORMAL LOW (ref 0.30–0.70)
Heparin Unfractionated: 0.24 IU/mL — ABNORMAL LOW (ref 0.30–0.70)

## 2021-07-17 LAB — CBC
HCT: 44.1 % (ref 39.0–52.0)
Hemoglobin: 15.1 g/dL (ref 13.0–17.0)
MCH: 33.1 pg (ref 26.0–34.0)
MCHC: 34.2 g/dL (ref 30.0–36.0)
MCV: 96.7 fL (ref 80.0–100.0)
Platelets: 186 10*3/uL (ref 150–400)
RBC: 4.56 MIL/uL (ref 4.22–5.81)
RDW: 15.7 % — ABNORMAL HIGH (ref 11.5–15.5)
WBC: 21.6 10*3/uL — ABNORMAL HIGH (ref 4.0–10.5)
nRBC: 0 % (ref 0.0–0.2)

## 2021-07-17 LAB — LACTIC ACID, PLASMA: Lactic Acid, Venous: 5.1 mmol/L (ref 0.5–1.9)

## 2021-07-17 LAB — MAGNESIUM: Magnesium: 2 mg/dL (ref 1.7–2.4)

## 2021-07-17 LAB — PHOSPHORUS: Phosphorus: 2.6 mg/dL (ref 2.5–4.6)

## 2021-07-17 MED ORDER — HEPARIN BOLUS VIA INFUSION
2000.0000 [IU] | Freq: Once | INTRAVENOUS | Status: AC
  Administered 2021-07-17: 2000 [IU] via INTRAVENOUS
  Filled 2021-07-17: qty 2000

## 2021-07-17 MED ORDER — MIDAZOLAM HCL 2 MG/2ML IJ SOLN
0.5000 mg | Freq: Once | INTRAMUSCULAR | Status: AC
Start: 2021-07-17 — End: 2021-07-17

## 2021-07-17 MED ORDER — NOREPINEPHRINE 16 MG/250ML-% IV SOLN
0.0000 ug/min | INTRAVENOUS | Status: DC
  Administered 2021-07-17: 24 ug/min via INTRAVENOUS
  Administered 2021-07-18: 46 ug/min via INTRAVENOUS
  Administered 2021-07-18: 44 ug/min via INTRAVENOUS
  Administered 2021-07-18: 46 ug/min via INTRAVENOUS
  Administered 2021-07-19: 60 ug/min via INTRAVENOUS
  Filled 2021-07-17 (×2): qty 250
  Filled 2021-07-17: qty 500
  Filled 2021-07-17 (×2): qty 250

## 2021-07-17 MED ORDER — LACTATED RINGERS IV SOLN: INTRAVENOUS | Status: DC

## 2021-07-17 MED ORDER — HYDROMORPHONE HCL 1 MG/ML IJ SOLN
0.5000 mg | INTRAMUSCULAR | Status: AC | PRN
  Administered 2021-07-17 – 2021-07-18 (×2): 0.5 mg via INTRAVENOUS
  Filled 2021-07-17 (×3): qty 0.5

## 2021-07-17 MED ORDER — AMIODARONE HCL IN DEXTROSE 360-4.14 MG/200ML-% IV SOLN
60.0000 mg/h | INTRAVENOUS | Status: AC
  Administered 2021-07-17 (×2): 60 mg/h via INTRAVENOUS
  Filled 2021-07-17 (×2): qty 200

## 2021-07-17 MED ORDER — LACTATED RINGERS IV BOLUS
1000.0000 mL | Freq: Once | INTRAVENOUS | Status: AC
  Administered 2021-07-17: 1000 mL via INTRAVENOUS

## 2021-07-17 MED ORDER — MIDAZOLAM HCL 2 MG/2ML IJ SOLN
INTRAMUSCULAR | Status: AC
  Administered 2021-07-17: 0.5 mg via INTRAVENOUS
  Filled 2021-07-17: qty 2

## 2021-07-17 MED ORDER — IOHEXOL 300 MG/ML  SOLN
75.0000 mL | Freq: Once | INTRAMUSCULAR | Status: AC | PRN
  Administered 2021-07-17: 75 mL via INTRAVENOUS

## 2021-07-17 MED ORDER — AMIODARONE LOAD VIA INFUSION
150.0000 mg | Freq: Once | INTRAVENOUS | Status: AC
  Administered 2021-07-17: 150 mg via INTRAVENOUS
  Filled 2021-07-17: qty 83.34

## 2021-07-17 MED ORDER — POTASSIUM CHLORIDE 10 MEQ/50ML IV SOLN
10.0000 meq | INTRAVENOUS | Status: AC
  Administered 2021-07-17 (×4): 10 meq via INTRAVENOUS
  Filled 2021-07-17 (×4): qty 50

## 2021-07-17 MED ORDER — AMIODARONE HCL IN DEXTROSE 360-4.14 MG/200ML-% IV SOLN
30.0000 mg/h | INTRAVENOUS | Status: DC
  Administered 2021-07-17 – 2021-07-19 (×4): 30 mg/h via INTRAVENOUS
  Filled 2021-07-17 (×4): qty 200

## 2021-07-17 NOTE — Progress Notes (Signed)
[facility] for Infectious Disease  Date of Admission:  07/11/2021     Total days of antibiotics ceftriaxone 2gm IV daily         ASSESSMENT:  PLAN:  Continue on ceftriaxone for salmonella bacteremia Recommend repeat blood cultures to see if clearance of infection If + on repeat blood cx, will need TEE Discussed with Dr Tacy Learn to do lumbar CT imaging with contrast. Patient has defibrillator and radiation seed-unclear if can repeat mri Pe =continue on anticlagulation Hypotension = still weaning slowly/requiring fvasopressors  Principal Problem:   Septic shock ([place]) Active Problems:   Sepsis due to Salmonella species with acute renal failure and septic shock (HCC)   AKI (acute kidney injury) ([place])   Acute metabolic encephalopathy    Chlorhexidine Gluconate Cloth  6 each Topical Daily   insulin aspart  0-9 Units Subcutaneous Q4H   sodium bicarbonate  100 mEq Intravenous Once   sodium chloride flush  10-40 mL Intracatheter Q12H    SUBJECTIVE:  Afebrile overnight with increase delirum and development of Afib with RVR.   No Known Allergies   Review of Systems: 12 point ros except for some ruq abdominal pain,     OBJECTIVE: Vitals:   07/17/21 0750 07/17/21 0800 07/17/21 0815 07/17/21 0830  BP:  (!) 85/60 (!) 87/62 96/84  Pulse:  (!) 113 (!) 112 (!) 116  Resp:  18 (!) 23 19  Temp: 97.7 F (36.5 C)     TempSrc: Oral     SpO2:  100% 97% 98%  Weight:       Body mass index is 34.32 kg/m.  Physical Exam Physical Exam  Constitutional: He is oriented to person, place, and time. He appears well-developed and well-nourished. No distress.  HENT:  Mouth/Throat: Oropharynx is clear and moist. No oropharyngeal exudate.  Cardiovascular: Normal rate, regular rhythm and normal heart sounds. Exam reveals no gallop and no friction rub.  No murmur heard.  Pulmonary/Chest: Effort normal and breath sounds normal. No respiratory distress. He has no wheezes.  Abdominal:  Soft. Bowel sounds are normal. He exhibits no distension. There is no tenderness.  TIW:PYKDXIPJA arms wrapped from skin tears Neurological: He is alert and oriented to person, place, and time.  Skin: Skin is warm and dry. No rash noted. No erythema.  Psychiatric: He has a normal mood and affect. His behavior is normal.   Lab Results Lab Results  Component Value Date   WBC 21.6 (H) 07/17/2021   HGB 15.1 07/17/2021   HCT 44.1 07/17/2021   MCV 96.7 07/17/2021   PLT 186 07/17/2021    Lab Results  Component Value Date   CREATININE 1.59 (H) 07/17/2021   BUN 30 (H) 07/17/2021   NA 133 (L) 07/17/2021   K 3.5 07/17/2021   CL 99 07/17/2021   CO2 18 (L) 07/17/2021    Lab Results  Component Value Date   ALT 63 (H) 07/16/2021   AST 122 (H) 07/16/2021   ALKPHOS 550 (H) 07/16/2021   BILITOT 2.7 (H) 07/16/2021     Microbiology: Recent Results (from the past 240 hour(s))  Blood Culture (routine x 2)     Status: Abnormal (Preliminary result)   Collection Time: 07/20/2021  7:15 PM   Specimen: BLOOD  Result Value Ref Range Status   Specimen Description BLOOD RIGHT ANTECUBITAL  Final   Special Requests   Final    BOTTLES DRAWN AEROBIC AND ANAEROBIC Blood Culture results may not be optimal due to an inadequate  volume of blood received in culture bottles   Culture  Setup Time   Final    GRAM NEGATIVE RODS ANAEROBIC BOTTLE ONLY Organism ID to follow CRITICAL RESULT CALLED TO, READ BACK BY AND VERIFIED WITH: J. FRENS [name], AT [phone] 07/16/21 D. VANHOOK    Culture (A)  Final    SALMONELLA SPECIES SUSCEPTIBILITIES TO FOLLOW Performed at [facility], [place] [address], [place], [place] [address]    Report Status PENDING  Incomplete  Blood Culture ID Panel (Reflexed)     Status: Abnormal   Collection Time: 07/30/2021  7:15 PM  Result Value Ref Range Status   Enterococcus faecalis NOT DETECTED NOT DETECTED Final   Enterococcus Faecium NOT DETECTED NOT DETECTED Final   Listeria monocytogenes  NOT DETECTED NOT DETECTED Final   Staphylococcus species NOT DETECTED NOT DETECTED Final   Staphylococcus aureus (BCID) NOT DETECTED NOT DETECTED Final   Staphylococcus epidermidis NOT DETECTED NOT DETECTED Final   Staphylococcus lugdunensis NOT DETECTED NOT DETECTED Final   Streptococcus species NOT DETECTED NOT DETECTED Final   Streptococcus agalactiae NOT DETECTED NOT DETECTED Final   Streptococcus pneumoniae NOT DETECTED NOT DETECTED Final   Streptococcus pyogenes NOT DETECTED NOT DETECTED Final   A.calcoaceticus-baumannii NOT DETECTED NOT DETECTED Final   Bacteroides fragilis NOT DETECTED NOT DETECTED Final   Enterobacterales DETECTED (A) NOT DETECTED Final    Comment: Enterobacterales represent a large order of gram negative bacteria, not a single organism. CRITICAL RESULT CALLED TO, READ BACK BY AND VERIFIED WITH: J. FRENS [name], AT [phone] 07/16/21 BY D. VANHOOK    Enterobacter cloacae complex NOT DETECTED NOT DETECTED Final   Escherichia coli NOT DETECTED NOT DETECTED Final   Klebsiella aerogenes NOT DETECTED NOT DETECTED Final   Klebsiella oxytoca NOT DETECTED NOT DETECTED Final   Klebsiella pneumoniae NOT DETECTED NOT DETECTED Final   Proteus species NOT DETECTED NOT DETECTED Final   Salmonella species DETECTED (A) NOT DETECTED Final    Comment: CRITICAL RESULT CALLED TO, READ BACK BY AND VERIFIED WITH: J. FRENS [name], AT [phone] 07/16/21 BY D. VANHOOK    Serratia marcescens NOT DETECTED NOT DETECTED Final   Haemophilus influenzae NOT DETECTED NOT DETECTED Final   Neisseria meningitidis NOT DETECTED NOT DETECTED Final   Pseudomonas aeruginosa NOT DETECTED NOT DETECTED Final   Stenotrophomonas maltophilia NOT DETECTED NOT DETECTED Final   Candida albicans NOT DETECTED NOT DETECTED Final   Candida auris NOT DETECTED NOT DETECTED Final   Candida glabrata NOT DETECTED NOT DETECTED Final   Candida krusei NOT DETECTED NOT DETECTED Final   Candida parapsilosis NOT DETECTED NOT DETECTED  Final   Candida tropicalis NOT DETECTED NOT DETECTED Final   Cryptococcus neoformans/gattii NOT DETECTED NOT DETECTED Final   CTX-M ESBL NOT DETECTED NOT DETECTED Final   Carbapenem resistance IMP NOT DETECTED NOT DETECTED Final   Carbapenem resistance KPC NOT DETECTED NOT DETECTED Final   Carbapenem resistance NDM NOT DETECTED NOT DETECTED Final   Carbapenem resist OXA 48 LIKE NOT DETECTED NOT DETECTED Final   Carbapenem resistance VIM NOT DETECTED NOT DETECTED Final    Comment: Performed at [facility], [address] [place], [place] [address]  Blood Culture (routine x 2)     Status: Abnormal (Preliminary result)   Collection Time: 07/18/2021  7:18 PM   Specimen: BLOOD  Result Value Ref Range Status   Specimen Description BLOOD BLOOD LEFT HAND  Final   Special Requests   Final    BOTTLES  DRAWN AEROBIC AND ANAEROBIC Blood Culture results may not be optimal due to an inadequate volume of blood received in culture bottles   Culture  Setup Time   Final    GRAM NEGATIVE RODS ANAEROBIC BOTTLE ONLY Performed at [facility], [place] [address] [place], [place] [address]    Culture SALMONELLA SPECIES (A)  Final   Report Status PENDING  Incomplete  Resp Panel by RT-PCR (Flu A&B, Covid) Anterior Nasal Swab     Status: None   Collection Time: 07/26/2021  7:44 PM   Specimen: Anterior Nasal Swab  Result Value Ref Range Status   SARS Coronavirus 2 by RT PCR NEGATIVE NEGATIVE Final    Comment: (NOTE) SARS-CoV-2 target nucleic acids are NOT DETECTED.  The SARS-CoV-2 RNA is generally detectable in upper respiratory specimens during the acute phase of infection. The lowest concentration of SARS-CoV-2 viral copies this assay can detect is 138 copies/mL. A negative result does not preclude SARS-Cov-2 infection and should not be used as the sole basis for treatment or other patient management decisions. A negative result may occur with  improper specimen collection/handling, submission of  specimen other than nasopharyngeal swab, presence of viral mutation(s) within the areas targeted by this assay, and inadequate number of viral copies(<138 copies/mL). A negative result must be combined with clinical observations, patient history, and epidemiological information. The expected result is Negative.  Fact Sheet for Patients:  EntrepreneurPulse.com.au  Fact Sheet for Healthcare Providers:  IncredibleEmployment.be  This test is no t yet approved or cleared by the [place] FDA and  has been authorized for detection and/or diagnosis of SARS-CoV-2 by FDA under an Emergency Use Authorization (EUA). This EUA will remain  in effect (meaning this test can be used) for the duration of the COVID-19 declaration under Section 564(b)(1) of the Act, 21 U.S.C.section 360bbb-3(b)(1), unless the authorization is terminated  or revoked sooner.       Influenza A by PCR NEGATIVE NEGATIVE Final   Influenza B by PCR NEGATIVE NEGATIVE Final    Comment: (NOTE) The Xpert Xpress SARS-CoV-2/FLU/RSV plus assay is intended as an aid in the diagnosis of influenza from Nasopharyngeal swab specimens and should not be used as a sole basis for treatment. Nasal washings and aspirates are unacceptable for Xpert Xpress SARS-CoV-2/FLU/RSV testing.  Fact Sheet for Patients: EntrepreneurPulse.com.au  Fact Sheet for Healthcare Providers: IncredibleEmployment.be  This test is not yet approved or cleared by the [place] FDA and has been authorized for detection and/or diagnosis of SARS-CoV-2 by FDA under an Emergency Use Authorization (EUA). This EUA will remain in effect (meaning this test can be used) for the duration of the COVID-19 declaration under Section 564(b)(1) of the Act, 21 U.S.C. section 360bbb-3(b)(1), unless the authorization is terminated or revoked.  Performed at [facility], [place] [address],  [place],  [address]   MRSA Next Gen by PCR, Nasal     Status: None   Collection Time: 07/29/2021 11:57 PM   Specimen: Nasal Mucosa; Nasal Swab  Result Value Ref Range Status   MRSA by PCR Next Gen NOT DETECTED NOT DETECTED Final    Comment: (NOTE) The GeneXpert MRSA Assay (FDA approved for NASAL specimens only), is one component of a comprehensive MRSA colonization surveillance program. It is not intended to diagnose MRSA infection nor to guide or monitor treatment for MRSA infections. Test performance is not FDA approved in patients less than 63 years old. Performed at [facility], [place]  [address] [place], [place] [address]      Terri Piedra, [place] for Infectious Disease [facility]  07/17/2021  10:12 AM

## 2021-07-17 NOTE — Progress Notes (Addendum)
ANTICOAGULATION CONSULT NOTE  Pharmacy Consult for Heparin  Indication: pulmonary embolus  No Known Allergies  Patient Measurements: Weight: 95 kg (209 lb 7 oz) Heparin dosing weight = 83 kg  Vital Signs: Temp: 99.3 F (37.4 C) (06/08 1103) Temp Source: Axillary (06/08 1103) BP: 97/72 (06/08 1045) Pulse Rate: 108 (06/08 1330)  Labs: Recent Labs    07/11/2021 1915 08/07/2021 2051 07/16/21 0050 07/16/21 1205 07/17/21 0358 07/17/21 1320  HGB 17.0 15.6 15.4  --  15.1  --   HCT 52.4* 46.0 45.1  --  44.1  --   PLT 258  --  225  --  186  --   APTT 30  --   --   --   --   --   LABPROT 17.6*  --   --   --   --   --   INR 1.5*  --   --   --   --   --   HEPARINUNFRC  --   --   --  0.35 0.12* 0.20*  CREATININE 2.14*  --  2.30*  --  1.59*  --   CKTOTAL 114  --   --   --   --   --      Estimated Creatinine Clearance: 44.2 mL/min (A) (by C-G formula based on SCr of 1.59 mg/dL (H)).  Assessment: 71 y/o M with AMS from sepsis secondary to UTI vs wound.  Also found to have small PE on CTA and Pharmacy consulted to dose IV heparin.  Heparin level subtherapeutic at 0.2 units/mL on 1600 units/hr.  No issue with heparin infusion nor bleeding.  Goal of Therapy:  Heparin level 0.3-0.7 units/ml Monitor platelets by anticoagulation protocol: Yes   Plan:  Increase heparin drip to 1850 units/hr Check 8 hr heparin level Daily heparin level and CBC Monitor closely for s/sx of bleeding  Gordan Grell D. Mina Marble, [name], BCPS, [place] 07/17/2021, 1:58 PM

## 2021-07-17 NOTE — Progress Notes (Signed)
eLink Physician-Brief Progress Note Patient Name: Devin Hanna DOB: Jul 05, 1950 MRN: [id number]   Date of Service  07/17/2021  HPI/Events of Note  Abdominal pain - RUQ ultrasound reveals: 1. Cirrhosis. No focal liver lesions identified by ultrasound. Please note that liver protocol enhanced MR and CT are the most sensitive tests for the screening detection of hepatocellular carcinoma in the high risk setting of cirrhosis. 2. Ascites. 3. Cholelithiasis with no acute cholecystitis. Dilaudid 0.5 mg IV Q 4 hours PRN D/Ced today. However, nursing states that he is in severe pain.   eICU Interventions  Plan: Dilaudid 0.5 mg IV Q 4 hours X 2 doses.     Intervention Category Major Interventions: Other:  Lysle Dingwall 07/17/2021, 9:54 PM

## 2021-07-17 NOTE — Progress Notes (Signed)
eLink Physician-Brief Progress Note Patient Name: Devin Hanna DOB: March 25, 1950 MRN: [id number]   Date of Service  07/17/2021  HPI/Events of Note  Nursing reports confusion and delirium. QTc interval = 0.545 seconds. Therefore, can not give Haldol or Zyprexa.   eICU Interventions  Plan: Versed 0.5 mg IV X 1.      Intervention Category Major Interventions: Delirium, psychosis, severe agitation - evaluation and management  Dorion Petillo [place] 07/17/2021, 2:27 AM

## 2021-07-17 NOTE — Progress Notes (Signed)
ANTICOAGULATION CONSULT NOTE - Follow Up Consult  Pharmacy Consult for heparin Indication: pulmonary embolus  Labs: Recent Labs    07/14/2021 1915 07/18/2021 2051 07/16/21 0050 07/16/21 1205 07/17/21 0358  HGB 17.0 15.6 15.4  --  15.1  HCT 52.4* 46.0 45.1  --  44.1  PLT 258  --  225  --  186  APTT 30  --   --   --   --   LABPROT 17.6*  --   --   --   --   INR 1.5*  --   --   --   --   HEPARINUNFRC  --   --   --  0.35 0.12*  CREATININE 2.14*  --  2.30*  --  1.59*  CKTOTAL 114  --   --   --   --     Assessment: 71yo male subtherapeutic on heparin after one level at low end of goal; of note pt went into Afib w/ RVR overnight, now on amio.  Goal of Therapy:  Heparin level 0.3-0.7 units/ml   Plan:  Will rebolus with heparin 2000 units and increase heparin infusion by 3 units/kg/hr to 1600 units/hr and check level in 8 hours.    Wynona Neat, [name], BCPS  07/17/2021,4:58 AM

## 2021-07-17 NOTE — Progress Notes (Addendum)
eLink Physician-Brief Progress Note Patient Name: Devin Hanna DOB: 12-02-1950 MRN: [id number]   Date of Service  07/17/2021  HPI/Events of Note  AFIB with RVR - Ventricular rate = 119. EKG reveals atrial fibrillation with rapid ventricular response and non-specific intra-ventricular conduction block. Cannot rule out Inferior infarct , age undetermined. Anterolateral infarct , age undetermined. Nursing can't get adequate cuff BP as the patient keeps moving. Patient is already on a Heparin IV infusion for PE.   eICU Interventions  Plan: Amiodarone IV load and infusion.  Send AM labs now.  RT to place A-line.      Intervention Category Major Interventions: Arrhythmia - evaluation and management  Devin Hanna 07/17/2021, 3:32 AM

## 2021-07-17 NOTE — Progress Notes (Signed)
Patient seen and examined at bedside. Frequently fidgeting and repositioning in bed, but able to lay still for several minutes when asked to for obtaining an updated BP. Current MAP 73 on NE 18, which has been a stable dose for several hours. RUE bandaged to the hand from skin tears. Previously unsuccessful attempt at A-line in LUE. At this point since we can get accurate BPs when we ask him to lay still, I would favor checking Q30 min with someone directly present to ensure we obtain an accurate BP. I think an A-line will fail quickly in someone who is moving around so much, and he has previously had unsuccessful attempts in the only upper extremity available. D/w RN, who agrees.   Julian Hy, DO 07/17/21 6:30 AM [facility]

## 2021-07-17 NOTE — Progress Notes (Signed)
NAME:  Devin Hanna, MRN:  [id number], DOB:  Apr 08, 1950, LOS: 2 ADMISSION DATE:  08/02/2021, CONSULTATION DATE:  07/30/2021 REFERRING MD:  EDP, CHIEF COMPLAINT:  AMS/fall   History of Present Illness:  71 yo male with pmh cad, dm2, obesity, htn, pud, ckdIIIa, paf, cirrhosis, recently dx's hepatocellular carcinoma s/p radiation therapy presented today after being found by his neighbor on the bathroom floor unable to get up. Pt states he was urinating and when he finished he became slightly light headed and tripped over his feet vs pass out. He is unclear about the specifics. When EMS arrived however he estimated he had been on the floor for approx. 30 mins. He was aaox3 and complaining of back pain (which is also chronic). He states he has been in his usual state of health prior to this event.   While being transported to [facility] pt became more altered and upon arrival was unable to communicate his own name. Pt baseline is independent, walks with walker, aaox3. He was taken for cth which was negative for acute process. Pt's labs were markedly abnormal and multiple hours after presentation while his mental status was improving his BP dropped to systolics in 00'F. He was started on norepi and subsequently vasopressin to maintain BP. His lactate cont to climb and he was admitted to ICU.   Pt states he is feeling better since presentation. He denies recent illness, stating "ive felt ok" no n/v/d no urinary symptoms (despite reports of foul smelling urine when he was found and sediment upon our catheterization). Denies fevers, daughter at bedside did endorse he likely was having chills as it was hot 3-4 days ago and while she was sweating he was shivering under 2-3 blankets. She states he didn't feel necessarily warm but no temperature was taken. Pt states he has been compliant with all of his m edications, eating and drinking well. He denies any pain with exception of his back which is chronic.    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  Tmax 98.9 X1 UOP, X3 stool, +3.9L this admission  Levophed 79mg, amio gtt  Subjective: denies chest pain, SOB   Objective   Blood pressure 96/84, pulse (!) 116, temperature 97.7 F (36.5 C), temperature source Oral, resp. rate 19, weight 95 kg, SpO2 98 %.        Intake/Output Summary (Last 24 hours) at 07/17/2021 1002 Last data filed at 07/17/2021 0800 Gross per 24 hour  Intake 2936.76 ml  Output 770 ml  Net 2166.76 ml   Filed Weights   08/06/2021 2355  Weight: 95 kg    Examination: General: In bed, NAD, appears comfortable HEENT: MM pink/moist, anicteric, ecchymosis to face Neuro: RASS 0, PERRL 375m GCS 15 CV: S1S2, Afib, no m/r/g appreciated PULM:  clear in the upper lobes, clear in the lower lobes, trachea midline, chest expansion symmetric GI: soft, bsx4 active, non-tender   Extremities: warm/dry, +2 pretibial edema, capillary refill less than 3 seconds  Skin: Abrasions on arms, no other wounds or rashes noted.  Labs Labs: Lactate  6.1>4.8>5.1 WBC 32.7>21.6 NA 133>134>133 AG 16, CO2 18 K 3.5 Creat 2.3>1.59, BUN 30 MG 2.0 Dopplers negative ECHO: LVEF 30 to 35%, RVSF within normal limits   Resolved Hospital Problem list     Assessment & Plan:  Septic shock, secondary to Salmonella bacteremia Present on blood cultures 6/6. On levophed. S/P 3000 ml fluid resusitation. Levophed requirements stable.  Unclear if Salmonella related to biliary tree. -Goal MAP 65 or greater. Levophed ordered.  Titrate medication to goal -New set of blood cultures obtained per ID follow-up results.  Patient may need lumbar spine MRI pending culture results per ID. -Continue Rocephin -Continue trending lactate  -Appreciate infectious disease assistance.  Pulmonary embolus, segmental, right lower lobe Suspect secondary to history of cancer, recent trauma.  Bilateral lower extremity Dopplers negative.  Echo without evidence of right heart strain. -Continue heparin drip -We will plan to switch to Lovenox once AKI improves  Chronic HFrEF Afib, with RVR, not on AC PTA Icd in place ECHO: LVEF 30 to 35%, RVSF within normal limits.  Patient developed A-fib with RVR overnight.  Started on amio drip -Continue amiodarone drip -Goal K above 4, goal MG above 2 -Continue telemetry   Acute metabolic encephalopathy, suspect secondary to sepsis, delirium Head CT negative for acute process, given benzodiazepines overnight by EMD. -Continue supportive care -Frequent reorientation -Monitor neuro exam -Goal MAP 65   Acute kidney injury suspect prerenal secondary to sepsis and shock. Improving. -Ensure renal perfusion. Goal MAP 65 or greater. -Avoid neprotoxic drugs as possible. -Strict I&O's -Follow up AM creatinine   Hyponatremia NA 216>244>695 -No free water -Monitor on a.m. BMP  Transaminitis Elevated alk phos Suspect chronically elevated since malignancy diagnosis, also elevated secondary to shock. -Follow LFT -Continue supportive care  Metabolic/lactic acidosis pt initally was not hypotensive and his lactate cont to climb despite some ivf. Bicarb given in ED.  -Continue to trend lactic acid -Supportive care -Goal MAP 65  LE edema:  R>>L (does have hardware in R).  Venous duplex negative suspect secondary to cardiac history. -Continue monitoring  Fall cth and c-spine negative for acute process -PT/OT when appropriate  Hepatocellular carcinoma:  not surgical candidate had  radiation beads placed with good response per chart reivew. 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  DM2 -Blood Glucose goal 140-180. -SSI   Best Practice (right click and "Reselect all SmartList Selections" daily)   Diet/type: clear DVT prophylaxis: systemic heparin GI prophylaxis: PPI Lines: Central line and yes and it is still needed Foley:  Yes, and it is still needed Code Status:  full code Last date of multidisciplinary goals of care discussion [07/17/21 with pt and daughter at bedside]  Critical care time: 39 min    Redmond School., MSN, APRN, AGACNP-BC [facility]  07/17/2021 , 10:02 AM  Please see [email or web address] for pager details  If no response, please call [phone] After hours, please call Elink at [phone]

## 2021-07-17 NOTE — Progress Notes (Signed)
[facility] ADULT ICU REPLACEMENT PROTOCOL   The patient does apply for the [facility] Adult ICU Electrolyte Replacment Protocol based on the criteria listed below:   1.Exclusion criteria: TCTS patients, ECMO patients, and Dialysis patients 2. Is GFR >/= 30 ml/min? Yes.    Patient's GFR today is 46 3. Is SCr </= 2? Yes.   Patient's SCr is 1.59 mg/dL 4. Did SCr increase >/= 0.5 in 24 hours? No. 5.Pt's weight >40kg  Yes.   6. Abnormal electrolyte(s): K+ 3.5  7. Electrolytes replaced per protocol 8.  Call MD STAT for K+ </= 2.5, Phos </= 1, or Mag </= 1 Physician:  Randie Heinz 07/17/2021 4:41 AM

## 2021-07-17 NOTE — Procedures (Signed)
Arterial Catheter Insertion Procedure Note  Devin Hanna  [id number]  1951/01/18  Date:07/17/21  Time:10:50 AM    Provider Performing: Estill Cotta    Procedure: Insertion of Arterial Line [phone]) with US guidance (71062)   Indication(s) Blood pressure monitoring and/or need for frequent ABGs  Consent Risks of the procedure as well as the alternatives and risks of each were explained to the patient and/or caregiver.  Consent for the procedure was obtained and is signed in the bedside chart  Anesthesia None   Time Out Verified patient identification, verified procedure, site/side was marked, verified correct patient position, special equipment/implants available, medications/allergies/relevant history reviewed, required imaging and test results available.   Sterile Technique Maximal sterile technique including full sterile barrier drape, hand hygiene, sterile gown, sterile gloves, mask, hair covering, sterile ultrasound probe cover (if used).   Procedure Description Area of catheter insertion was cleaned with chlorhexidine and draped in sterile fashion. With real-time ultrasound guidance an arterial catheter was placed into the right  axillary artery.  Appropriate arterial tracings confirmed on monitor.       Complications/Tolerance None; patient tolerated the procedure well.   EBL Minimal   Specimen(s) None  [employer]., MSN, APRN, AGACNP-BC [facility]  07/17/2021 , 10:52 AM  Please see [email or web address] for pager details  If no response, please call [phone] After hours, please call Elink at [phone]

## 2021-07-17 NOTE — Progress Notes (Signed)
Two RT's attempted a-line placement with no success. RN made aware.

## 2021-07-18 ENCOUNTER — Inpatient Hospital Stay (HOSPITAL_COMMUNITY): Payer: PPO

## 2021-07-18 DIAGNOSIS — R6521 Severe sepsis with septic shock: Secondary | ICD-10-CM | POA: Diagnosis not present

## 2021-07-18 DIAGNOSIS — A419 Sepsis, unspecified organism: Secondary | ICD-10-CM | POA: Diagnosis not present

## 2021-07-18 LAB — DIFFERENTIAL
Abs Immature Granulocytes: 0.26 10*3/uL — ABNORMAL HIGH (ref 0.00–0.07)
Basophils Absolute: 0 10*3/uL (ref 0.0–0.1)
Basophils Relative: 0 %
Eosinophils Absolute: 0 10*3/uL (ref 0.0–0.5)
Eosinophils Relative: 0 %
Immature Granulocytes: 2 %
Lymphocytes Relative: 4 %
Lymphs Abs: 0.8 10*3/uL (ref 0.7–4.0)
Monocytes Absolute: 1.7 10*3/uL — ABNORMAL HIGH (ref 0.1–1.0)
Monocytes Relative: 9 %
Neutro Abs: 15.2 10*3/uL — ABNORMAL HIGH (ref 1.7–7.7)
Neutrophils Relative %: 85 %

## 2021-07-18 LAB — BRAIN NATRIURETIC PEPTIDE: B Natriuretic Peptide: 3233.1 pg/mL — ABNORMAL HIGH (ref 0.0–100.0)

## 2021-07-18 LAB — POCT I-STAT 7, (LYTES, BLD GAS, ICA,H+H)
Acid-base deficit: 4 mmol/L — ABNORMAL HIGH (ref 0.0–2.0)
Acid-base deficit: 13 mmol/L — ABNORMAL HIGH (ref 0.0–2.0)
Bicarbonate: 17.1 mmol/L — ABNORMAL LOW (ref 20.0–28.0)
Bicarbonate: 12.5 mmol/L — ABNORMAL LOW (ref 20.0–28.0)
Calcium, Ion: 1.09 mmol/L — ABNORMAL LOW (ref 1.15–1.40)
Calcium, Ion: 1.02 mmol/L — ABNORMAL LOW (ref 1.15–1.40)
HCT: 44 % (ref 39.0–52.0)
HCT: 38 % — ABNORMAL LOW (ref 39.0–52.0)
Hemoglobin: 15 g/dL (ref 13.0–17.0)
Hemoglobin: 12.9 g/dL — ABNORMAL LOW (ref 13.0–17.0)
O2 Saturation: 97 %
O2 Saturation: 95 %
Patient temperature: 98.6
Patient temperature: 98.6
Potassium: 3.7 mmol/L (ref 3.5–5.1)
Potassium: 4.3 mmol/L (ref 3.5–5.1)
Sodium: 132 mmol/L — ABNORMAL LOW (ref 135–145)
Sodium: 133 mmol/L — ABNORMAL LOW (ref 135–145)
TCO2: 18 mmol/L — ABNORMAL LOW (ref 22–32)
TCO2: 13 mmol/L — ABNORMAL LOW (ref 22–32)
pCO2 arterial: 23.6 mmHg — ABNORMAL LOW (ref 32–48)
pCO2 arterial: 27 mmHg — ABNORMAL LOW (ref 32–48)
pH, Arterial: 7.469 — ABNORMAL HIGH (ref 7.35–7.45)
pH, Arterial: 7.272 — ABNORMAL LOW (ref 7.35–7.45)
pO2, Arterial: 78 mmHg — ABNORMAL LOW (ref 83–108)
pO2, Arterial: 85 mmHg (ref 83–108)

## 2021-07-18 LAB — CBC
HCT: 42.1 % (ref 39.0–52.0)
Hemoglobin: 14.7 g/dL (ref 13.0–17.0)
MCH: 33 pg (ref 26.0–34.0)
MCHC: 34.9 g/dL (ref 30.0–36.0)
MCV: 94.4 fL (ref 80.0–100.0)
Platelets: 159 10*3/uL (ref 150–400)
RBC: 4.46 MIL/uL (ref 4.22–5.81)
RDW: 15.4 % (ref 11.5–15.5)
WBC: 15.6 10*3/uL — ABNORMAL HIGH (ref 4.0–10.5)
nRBC: 0.2 % (ref 0.0–0.2)

## 2021-07-18 LAB — BASIC METABOLIC PANEL
Anion gap: 14 (ref 5–15)
BUN: 29 mg/dL — ABNORMAL HIGH (ref 8–23)
CO2: 17 mmol/L — ABNORMAL LOW (ref 22–32)
Calcium: 7.7 mg/dL — ABNORMAL LOW (ref 8.9–10.3)
Chloride: 99 mmol/L (ref 98–111)
Creatinine, Ser: 1.35 mg/dL — ABNORMAL HIGH (ref 0.61–1.24)
GFR, Estimated: 55 mL/min — ABNORMAL LOW (ref >60)
Glucose, Bld: 140 mg/dL — ABNORMAL HIGH (ref 70–99)
Potassium: 3.9 mmol/L (ref 3.5–5.1)
Sodium: 130 mmol/L — ABNORMAL LOW (ref 135–145)

## 2021-07-18 LAB — GLUCOSE, CAPILLARY
Glucose-Capillary: 118 mg/dL — ABNORMAL HIGH (ref 70–99)
Glucose-Capillary: 133 mg/dL — ABNORMAL HIGH (ref 70–99)
Glucose-Capillary: 150 mg/dL — ABNORMAL HIGH (ref 70–99)
Glucose-Capillary: 175 mg/dL — ABNORMAL HIGH (ref 70–99)
Glucose-Capillary: 135 mg/dL — ABNORMAL HIGH (ref 70–99)
Glucose-Capillary: 82 mg/dL (ref 70–99)

## 2021-07-18 LAB — PHOSPHORUS: Phosphorus: 1.9 mg/dL — ABNORMAL LOW (ref 2.5–4.6)

## 2021-07-18 LAB — LACTIC ACID, PLASMA
Lactic Acid, Venous: 4.2 mmol/L (ref 0.5–1.9)
Lactic Acid, Venous: 8.2 mmol/L (ref 0.5–1.9)
Lactic Acid, Venous: >9 mmol/L (ref 0.5–1.9)
Lactic Acid, Venous: 8.3 mmol/L (ref 0.5–1.9)

## 2021-07-18 LAB — HEPATIC FUNCTION PANEL
ALT: 100 U/L — ABNORMAL HIGH (ref 0–44)
AST: 127 U/L — ABNORMAL HIGH (ref 15–41)
Albumin: 1.7 g/dL — ABNORMAL LOW (ref 3.5–5.0)
Alkaline Phosphatase: 421 U/L — ABNORMAL HIGH (ref 38–126)
Bilirubin, Direct: 1.3 mg/dL — ABNORMAL HIGH (ref 0.0–0.2)
Indirect Bilirubin: 0.7 mg/dL (ref 0.3–0.9)
Total Bilirubin: 2 mg/dL — ABNORMAL HIGH (ref 0.3–1.2)
Total Protein: 4.9 g/dL — ABNORMAL LOW (ref 6.5–8.1)

## 2021-07-18 LAB — TROPONIN I (HIGH SENSITIVITY)
Troponin I (High Sensitivity): 234 ng/L (ref <18)
Troponin I (High Sensitivity): 291 ng/L (ref <18)
Troponin I (High Sensitivity): 400 ng/L (ref <18)

## 2021-07-18 LAB — MAGNESIUM: Magnesium: 1.9 mg/dL (ref 1.7–2.4)

## 2021-07-18 MED ORDER — SODIUM CHLORIDE 0.9 % IV SOLN
3.0000 g | Freq: Four times a day (QID) | INTRAVENOUS | Status: DC
  Administered 2021-07-18 – 2021-07-19 (×3): 3 g via INTRAVENOUS
  Filled 2021-07-18 (×3): qty 8

## 2021-07-18 MED ORDER — ENOXAPARIN SODIUM 100 MG/ML IJ SOSY
90.0000 mg | PREFILLED_SYRINGE | Freq: Two times a day (BID) | INTRAMUSCULAR | Status: DC
  Administered 2021-07-18 (×2): 90 mg via SUBCUTANEOUS
  Filled 2021-07-18 (×3): qty 0.9

## 2021-07-18 MED ORDER — VANCOMYCIN HCL 2000 MG/400ML IV SOLN
2000.0000 mg | Freq: Once | INTRAVENOUS | Status: AC
  Administered 2021-07-18: 2000 mg via INTRAVENOUS
  Filled 2021-07-18: qty 400

## 2021-07-18 MED ORDER — LACTATED RINGERS IV BOLUS
500.0000 mL | Freq: Once | INTRAVENOUS | Status: AC
Start: 2021-07-18 — End: 2021-07-18
  Administered 2021-07-18: 500 mL via INTRAVENOUS

## 2021-07-18 MED ORDER — SODIUM BICARBONATE 8.4 % IV SOLN
25.0000 meq | Freq: Once | INTRAVENOUS | Status: DC
  Filled 2021-07-18: qty 50

## 2021-07-18 MED ORDER — FUROSEMIDE 10 MG/ML IJ SOLN
60.0000 mg | Freq: Once | INTRAMUSCULAR | Status: AC
  Administered 2021-07-19: 60 mg via INTRAVENOUS
  Filled 2021-07-18: qty 6

## 2021-07-18 MED ORDER — SODIUM CHLORIDE 0.9 % IV SOLN: INTRAVENOUS | Status: DC

## 2021-07-18 MED ORDER — SODIUM CHLORIDE 0.9 % IV BOLUS
1000.0000 mL | Freq: Once | INTRAVENOUS | Status: AC
  Administered 2021-07-18: 1000 mL via INTRAVENOUS

## 2021-07-18 MED ORDER — ALBUTEROL SULFATE (2.5 MG/3ML) 0.083% IN NEBU
2.5000 mg | INHALATION_SOLUTION | RESPIRATORY_TRACT | Status: DC | PRN
Start: 2021-07-18 — End: 2021-07-19

## 2021-07-18 MED ORDER — HYDROMORPHONE HCL 2 MG PO TABS
1.0000 mg | ORAL_TABLET | Freq: Four times a day (QID) | ORAL | Status: DC | PRN
  Administered 2021-07-18: 1 mg via ORAL
  Filled 2021-07-18: qty 1

## 2021-07-18 MED ORDER — LACTATED RINGERS IV BOLUS
500.0000 mL | Freq: Once | INTRAVENOUS | Status: AC
  Administered 2021-07-18: 500 mL via INTRAVENOUS

## 2021-07-18 MED ORDER — ALBUMIN HUMAN 25 % IV SOLN
25.0000 g | Freq: Once | INTRAVENOUS | Status: AC
  Administered 2021-07-18: 25 g via INTRAVENOUS
  Filled 2021-07-18: qty 100

## 2021-07-18 MED ORDER — LACTATED RINGERS IV SOLN: INTRAVENOUS | Status: DC

## 2021-07-18 MED ORDER — SODIUM BICARBONATE 8.4 % IV SOLN
50.0000 meq | Freq: Once | INTRAVENOUS | Status: AC
  Administered 2021-07-18: 50 meq via INTRAVENOUS

## 2021-07-18 MED ORDER — VANCOMYCIN HCL IN DEXTROSE 1-5 GM/200ML-% IV SOLN
1000.0000 mg | INTRAVENOUS | Status: DC
Start: 2021-07-19 — End: 2021-07-19

## 2021-07-18 MED ORDER — ALBUTEROL SULFATE (2.5 MG/3ML) 0.083% IN NEBU
INHALATION_SOLUTION | RESPIRATORY_TRACT | Status: AC
  Administered 2021-07-18: 2.5 mg via RESPIRATORY_TRACT
  Filled 2021-07-18: qty 3

## 2021-07-18 MED ORDER — MAGNESIUM SULFATE 2 GM/50ML IV SOLN
2.0000 g | Freq: Once | INTRAVENOUS | Status: AC
  Administered 2021-07-18: 2 g via INTRAVENOUS
  Filled 2021-07-18: qty 50

## 2021-07-18 MED ORDER — FUROSEMIDE 10 MG/ML IJ SOLN
20.0000 mg | Freq: Once | INTRAMUSCULAR | Status: AC
  Administered 2021-07-18: 20 mg via INTRAVENOUS
  Filled 2021-07-18: qty 2

## 2021-07-18 MED ORDER — SODIUM CHLORIDE 0.9 % IV BOLUS
500.0000 mL | Freq: Once | INTRAVENOUS | Status: AC
  Administered 2021-07-18: 500 mL via INTRAVENOUS

## 2021-07-18 MED ORDER — POTASSIUM PHOSPHATES 15 MMOLE/5ML IV SOLN
30.0000 mmol | Freq: Once | INTRAVENOUS | Status: AC
  Administered 2021-07-18: 30 mmol via INTRAVENOUS
  Filled 2021-07-18: qty 10

## 2021-07-18 NOTE — Progress Notes (Signed)
USGPIV attempt x2; left upper arm and right lower arm. Blood return noted x2 unable to thread catheter x2. Patient has a triple lumen CVC; RN requested PIV for incompatible meds. After attempts RN stated they would stagger meds in order to ensure that he is able to receive everything.   Zorion Nims Lorita Officer, RN

## 2021-07-18 NOTE — Progress Notes (Signed)
[facility] ADULT ICU REPLACEMENT PROTOCOL   The patient does apply for the [facility] Adult ICU Electrolyte Replacment Protocol based on the criteria listed below:   1.Exclusion criteria: TCTS patients, ECMO patients, and Dialysis patients 2. Is GFR >/= 30 ml/min? Yes.    Patient's GFR today is 55 3. Is SCr </= 2? Yes.   Patient's SCr is 1.35 mg/dL 4. Did SCr increase >/= 0.5 in 24 hours? No. 5.Pt's weight >40kg  Yes.   6. Abnormal electrolyte(s): Phos 1.9, Mag 1.9  7. Electrolytes replaced per protocol 8.  Call MD STAT for K+ </= 2.5, Phos </= 1, or Mag </= 1 Physician:  Randie Heinz 07/18/2021 4:45 AM

## 2021-07-18 NOTE — Progress Notes (Signed)
eLink Physician-Brief Progress Note Patient Name: Devin Hanna DOB: 1950/03/04 MRN: [id number]   Date of Service  07/18/2021  HPI/Events of Note  ABG on room air = 7.469/23.6/78/17.1  eICU Interventions  Continue present management.     Intervention Category Major Interventions: Respiratory failure - evaluation and management  Demiana Crumbley Eugene 07/18/2021, 6:08 AM

## 2021-07-18 NOTE — Progress Notes (Signed)
Called to bedside to assess respiratory status. Daughter at bedside. Patient confused and unable to provide history. Per daughter breathing is more labored than yesterday. His breathing had significantly worsened about 3 hours ago prior to planned trip to [place] when he was moved around. He has improved some with lasix, but only had about 180cc UOP since. He had been getting more IVF boluses today with ongoing lactic acidosis. Abd CT has not been able to be performed yet due to respiratory status.   ABG    Component Value Date/Time   PHART 7.272 (L) 07/18/2021 2055   PCO2ART 27.0 (L) 07/18/2021 2055   PO2ART 85 07/18/2021 2055   HCO3 12.5 (L) 07/18/2021 2055   TCO2 13 (L) 07/18/2021 2055   ACIDBASEDEF 13.0 (H) 07/18/2021 2055   O2SAT 95 07/18/2021 2055   BNP 3233 Trop 234> 291>400 LA 8.3 WBC has been downtrending the past few days No coox previously drawn.  Echo: LVEF 30-35%, dilated RV CXR personally reviewed> lateral RLL infiltrate, increased markings bilaterally suggestive of pulm edema  BP (!) 85/67 (BP Location: Right Arm)   Pulse (!) 113   Temp 97.7 F (36.5 C) (Oral)   Resp (!) 25   Wt 95 kg   SpO2 98%   BMI 34.32 kg/m  Ill appearing man lying in bed Confused, speaking nonsensically Tachypneic, no accessory muscle use, faint rhales, no wheezing. RRR ++edema, especially in LEs Pallor, bruising  Plan: 1 amp bicarb Lasix 63m x 1 dose Coox STAT Hold off on intubation currently; if coox is significantly low to explain lactic acidosis and LFT abnormalities, may need more aggressive cardiac support and diuresis. Downtrending WBC would potentially support cardiogenic shock as an alternative to persistent septic shock. Low threshold to intubate if worsening as he remains tenuous. Hopefully treatment of his metabolic acidosis and pulm edema will improve his respiratory status quickly.  Daughter updated at bedside.  Cc time: 20 min.  LJulian Hy DO 07/18/21 11:54  PM [facility]

## 2021-07-18 NOTE — Progress Notes (Signed)
Pharmacy Antibiotic Note  Devin Hanna is a 71 y.o. male admitted on 08/04/2021 with sepsis.  Pharmacy has been consulted for vancomycin and Unasyn dosing.  Plan to give vancomycin 20 mg/kg bolus dose followed by maintenance dose:  Scr used: 1.35 mg/dL Weight: 95 kg Vd coeff: 0.5 L/kg Est AUC: 531  Plan: Discontinue ceftriaxone Vancomycin 2 g IV x1 followed by vancomycin 1000 mg IV q24h  Unasyn 3 g IV q6h  F/u clinical course, cultures, renal function Levels as indicated De-escalate when able   Weight: 95 kg (209 lb 7 oz)  Temp (24hrs), Avg:97.9 F (36.6 C), Min:97.5 F (36.4 C), Max:98.5 F (36.9 C)  Recent Labs  Lab 08/05/2021 1915 07/20/2021 2039 07/16/21 0050 07/16/21 0500 07/16/21 2230 07/17/21 0357 07/17/21 0358 07/18/21 0350 07/18/21 1111 07/18/21 1429  WBC 6.5  --  32.7*  --   --   --  21.6* 15.6*  --   --   CREATININE 2.14*  --  2.30*  --   --   --  1.59* 1.35*  --   --   LATICACIDVEN 8.9*   < > 7.4*   < > 4.8* 5.1*  --  4.2* 8.2* >9.0*   < > = values in this interval not displayed.    Estimated Creatinine Clearance: 52.1 mL/min (A) (by C-G formula based on SCr of 1.35 mg/dL (H)).    No Known Allergies  Antimicrobials this admission: Vanc 6/6 >> 6/7 Cefepime 6/6 >> 6/7 Flagyl 6/6 >> 6/7 Rocephin 6/7 >> 6/8 Vancomycin 6/9 >> Unasyn 6/9 >>  Dose adjustments this admission: N/A  Microbiology results: 6/6 BCID: salmonella species 6/6 BCx: salmonella species 6/6 MRSA PCR: negative 6/8 BCx: pending  Thank you for allowing pharmacy to be a part of this patient's care.  Zenaida Deed, [name] PGY1 Acute Care Pharmacy Resident  Phone: [phone] 07/18/2021  6:18 PM  Please check [email or web address] for unit-specific pharmacy phone numbers.

## 2021-07-18 NOTE — Progress Notes (Addendum)
eLink Physician-Brief Progress Note Patient Name: Devin Hanna DOB: 1950-08-28 MRN: [id number]   Date of Service  07/18/2021  HPI/Events of Note  Hypotension - Nursing reports increased Norepinephrine IV infusion requirement through the night from 20 mcg/min to 40 mcg/min now. Hgb = 14.7. Albumin = 1.9. CVP = 9-10.  eICU Interventions  Plan: Monitor CVP now and Q 4 hours. ABG STAT. 25% Albumin 25 gm IV now.  Increase ceiling on Norepinephrine IV infusion to 60 mcg/min.      Intervention Category Major Interventions: Hypotension - evaluation and management  Kyriana Yankee Eugene 07/18/2021, 4:24 AM

## 2021-07-18 NOTE — TOC Progression Note (Signed)
Transition of Care [facility]) - Progression Note    Patient Details  Name: Devin Hanna MRN: [id number] Date of Birth: 20-Nov-1950  Transition of Care [facility]) CM/SW [place], RN Phone Number:[phone]  07/18/2021, 2:55 PM  Clinical Narrative:     Transition of Care (TOC) Screening Note   Patient Details  Name: Devin Hanna Date of Birth: 1950/07/23   Transition of Care [facility]) CM/SW Contact:    Angelita Ingles, RN Phone Number: 07/18/2021, 2:55 PM    Transition of Care Department [facility]) has reviewed patient and no TOC needs have been identified at this time. We will continue to monitor patient advancement through interdisciplinary progression rounds.           Expected Discharge Plan and Services                                                 Social Determinants of Health (SDOH) Interventions    Readmission Risk Interventions     No data to display

## 2021-07-18 NOTE — Progress Notes (Signed)
NAME:  HAKIEM MALIZIA, MRN:  [id number], DOB:  09-17-1950, LOS: 3 ADMISSION DATE:  07/18/2021, CONSULTATION DATE:  08/01/2021 REFERRING MD:  EDP, CHIEF COMPLAINT:  AMS/fall   History of Present Illness:  71 yo male with pmh cad, dm2, obesity, htn, pud, ckdIIIa, paf, cirrhosis, recently dx's hepatocellular carcinoma s/p radiation therapy presented today after being found by his neighbor on the bathroom floor unable to get up. Pt states he was urinating and when he finished he became slightly light headed and tripped over his feet vs pass out. He is unclear about the specifics. When EMS arrived however he estimated he had been on the floor for approx. 30 mins. He was aaox3 and complaining of back pain (which is also chronic). He states he has been in his usual state of health prior to this event.   While being transported to [facility] pt became more altered and upon arrival was unable to communicate his own name. Pt baseline is independent, walks with walker, aaox3. He was taken for cth which was negative for acute process. Pt's labs were markedly abnormal and multiple hours after presentation while his mental status was improving his BP dropped to systolics in 40'J. He was started on norepi and subsequently vasopressin to maintain BP. His lactate cont to climb and he was admitted to ICU.   Pt states he is feeling better since presentation. He denies recent illness, stating "ive felt ok" no n/v/d no urinary symptoms (despite reports of foul smelling urine when he was found and sediment upon our catheterization). Denies fevers, daughter at bedside did endorse he likely was having chills as it was hot 3-4 days ago and while she was sweating he was shivering under 2-3 blankets. She states he didn't feel necessarily warm but no temperature was taken. Pt states he has been compliant with all of his m edications, eating and drinking well. He denies any pain with exception of his back which is chronic.    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  Remains on room air.  Plan for full fluid challenge with CVP of 7   Objective   Blood pressure (!) 85/67, pulse (!) 115, temperature 98.3 F (36.8 C), temperature source Oral, resp. rate (!) 37, weight 95 kg, SpO2 100 %. CVP:  [66 mmHg-88 mmHg] 81 mmHg      Intake/Output Summary (Last 24 hours) at 07/18/2021 0914 Last data filed at 07/18/2021 0800 Gross per 24 hour  Intake 3361.91 ml  Output 200 ml  Net 3161.91 ml   Filed Weights   07/14/2021 2355  Weight: 95 kg    Examination: General: Elderly male on vasopressor support around room air HEENT: MM pink/moist right-sided facial lacerations noted no JVD is appreciated Neuro: pleasantly confused follows commands CV: Heart sounds are regular remains on 4 with Levophed and vasopressin PULM: Mild rhonchi bilaterally decreased breath sounds in the bases   GI: soft, bsx4 active tender right upper quadrant GU: Amber urine Extremities: warm/dry, 2-3+ lower extremity edema  Skin: no rashes or lesions multiple areas of abrasions face arm knees from fall  Resolved Hospital Problem list     Assessment & Plan:  Septic shock, secondary to Salmonella bacteremia Present on blood cultures 6/6. On levophed. S/P 3000 ml fluid resusitation. Unclear if Salmonella related to biliary tree.  CVP noted to be 7 Lactated Ringer's 1000 cc bolus and 500 cc increments x2 Attempt to simplify IV medications last pharmacy concerning Lovenox and stopping heparin drip Continue vasopressor support as needed Continue antimicrobial  therapy Continue to monitor lactic acid    Pulmonary embolus, segmental, right lower lobe Suspect secondary to history of cancer, recent trauma.  Bilateral lower extremity Dopplers negative.  Echo without evidence of right heart strain. Continue anticoagulation  Chronic HFrEF Afib, with RVR, not on AC PTA Icd in place ECHO: LVEF 30 to 35%, RVSF within normal limits.  Patient developed A-fib with RVR overnight.  Started on amio drip Amiodarone drip Electrolytes maintain within usual limits Remains in atrial fibrillation    -Continue amiodarone drip -Goal K above 4, goal MG above 2 -Continue telemetry   Acute metabolic encephalopathy, suspect secondary to sepsis, delirium Head CT negative for acute process, given benzodiazepines overnight by EMD. Continue current treatment Avoid sedation Neurochecks     Acute kidney injury Lab Results  Component Value Date   CREATININE 1.35 (H) 07/18/2021   CREATININE 1.59 (H) 07/17/2021   CREATININE 2.30 (H) 07/16/2021   CREATININE 1.11 05/27/2021   CREATININE 1.43 (H) 12/16/2020   CREATININE 1.52 (H) 03/30/2018  suspect prerenal secondary to sepsis and shock. Improving. Avoid nephrotoxins Maintain adequate perfusion   -Ensure renal perfusion. Goal MAP 65 or greater. -Avoid neprotoxic drugs as possible. -Strict I&O's -Follow up AM creatinine   Hyponatremia Recent Labs  Lab 07/17/21 0358 07/18/21 0350 07/18/21 0451  NA 133* 130* 132*  Monitor daily    Transaminitis Elevated alk phos Suspect chronically elevated since malignancy diagnosis, also elevated secondary to shock. Continue to follow LFTs  Metabolic/lactic acidosis pt initally was not hypotensive and his lactate cont to climb despite some ivf. Bicarb given in ED.  Continue monitor lactic acid which is declining Maintain mean arterial pressure greater than 65 currently on levo and vaso     LE edema:  R>>L (does have hardware in R).  Venous duplex  negative suspect secondary to cardiac history. Continue to monitor Strict intake and output May be secondary to being on bedrest  Fall cth and c-spine negative for acute process Continue to monitor Dressed superficial wound  Hepatocellular carcinoma:  not surgical candidate had radiation beads placed with good response per chart reivew. There was potential areas of residual cancer, f/u imaging was to be in April but he has it rescheduled 2/2 inability to lay flat Outpatient follow-up MRI when stable I   DM2 CBG (last 3)  Recent Labs    07/17/21 2329 07/18/21 0335 07/18/21 0721  GLUCAP 170* 118* 133*    Sliding-scale insulin protocol  Best Practice (right click and "Reselect all SmartList Selections" daily)   Diet/type: clear DVT prophylaxis: systemic heparin GI prophylaxis: PPI Lines: Central line and yes and it is still needed Foley:  Yes, and it is still needed Code Status:  full code Last date of multidisciplinary goals of care discussion [07/17/21 with pt and daughter at bedside] 07/18/2021 daughter and son-in-law updated at bedside extensive details given in care titration  Critical care time: 25 min    Richardson Landry Kraig Genis ACNP Acute Care Nurse Practitioner [place] Please consult Amion 07/18/2021, 9:14 AM

## 2021-07-18 NOTE — Progress Notes (Addendum)
[facility] for Infectious Disease    Date of Admission:  08/05/2021   Total days of antibiotics 4          ID: Devin Hanna is a 71 y.o. male with   Principal Problem:   Septic shock ([place]) Active Problems:   Sepsis due to Salmonella species with acute renal failure and septic shock ([place])   AKI (acute kidney injury) ([place])   Acute metabolic encephalopathy    Subjective: Afebrile but patient feeling "hot" more agitated. Overnight requiring increase in pressor support, also having less urine output  Ekg per my read still shows afib with rate in 110s.jpoint elevation in lateral leads but primary team still concern for cardiac insult. Troponin pending. He continues on enoxaparin for PE  He did undergo lumbar CT last night -  Lactic acid trending upward today Medications:   Chlorhexidine Gluconate Cloth  6 each Topical Daily   enoxaparin (LOVENOX) injection  90 mg Subcutaneous BID   insulin aspart  0-9 Units Subcutaneous Q4H   sodium chloride flush  10-40 mL Intracatheter Q12H    Objective: Vital signs in last 24 hours: Temp:  [97.5 F (36.4 C)-98.5 F (36.9 C)] 97.5 F (36.4 C) (06/09 1520) Pulse Rate:  [100-138] 125 (06/09 1500) Resp:  [9-37] 22 (06/09 1445) BP: (85-94)/(64-75) 85/67 (06/09 0800) SpO2:  [79 %-100 %] 100 % (06/09 1500) Arterial Line BP: (61-136)/(31-103) 99/65 (06/09 1500)  Physical Exam  Constitutional: He is oriented to person, place, and time. He appears well-developed and well-nourished. No distress.  HENT:  Mouth/Throat: Oropharynx is clear and moist. No oropharyngeal exudate.  Cardiovascular: tachy irreg, irreg, regular rhythm and normal heart sounds. Exam reveals no gallop and no friction rub.  No murmur heard.  Pulmonary/Chest: Effort normal and breath sounds normal. No respiratory distress. He has no wheezes.  Abdominal: Soft. Bowel sounds are decreased. +distension. There is no tenderness.  Lymphadenopathy:  He has no cervical  adenopathy.  Ext: arms wrapped for skin tears. Dry and flaky Neurological: He is alert and oriented to person, place, and time.  Skin: Skin is warm and dry. No rash noted. No erythema.  Psychiatric: He has a normal mood and affect. His behavior is normal.    Lab Results Recent Labs    07/17/21 0358 07/18/21 0350 07/18/21 0451  WBC 21.6* 15.6*  --   HGB 15.1 14.7 15.0  HCT 44.1 42.1 44.0  NA 133* 130* 132*  K 3.5 3.9 3.7  CL 99 99  --   CO2 18* 17*  --   BUN 30* 29*  --   CREATININE 1.59* 1.35*  --    Liver Panel Recent Labs    07/10/2021 1915 07/16/21 0050  PROT 6.1* 5.2*  ALBUMIN 2.2* 1.9*  AST 103* 122*  ALT 64* 63*  ALKPHOS 655* 550*  BILITOT 2.8* 2.7*   Sedimentation Rate No results for input(s): "ESRSEDRATE" in the last 72 hours. C-Reactive Protein No results for input(s): "CRP" in the last 72 hours.  Microbiology:  SALMONELLA SPECIES Abnormal    Report Status PENDING   Organism ID, Bacteria SALMONELLA SPECIES   Resulting Agency [facility]     Susceptibility   Salmonella species    MIC    AMPICILLIN <=2 SENSITIVE  Sensitive    CEFTRIAXONE  Sensitive 1    LEVOFLOXACIN 1 SENSITIVE  Sensitive    TRIMETH/SULFA <=20 SENSIT... Sensitive  Studies/Results: US Abdomen Limited RUQ (LIVER/GB)  Result Date: 07/17/2021 CLINICAL DATA:  [id number].  Abdominal pain. EXAM: ULTRASOUND ABDOMEN LIMITED RIGHT UPPER QUADRANT COMPARISON:  CTA abdomen pelvis 07/16/2021 FINDINGS: Gallbladder: Calcified gallstone within the gallbladder lumen. No gallbladder wall thickening or pericholecystic fluid visualized. No sonographic Murphy sign noted by sonographer. Common bile duct: Diameter: 4 mm. Liver: Nodular hepatic contour. No focal lesion identified. Within normal limits in parenchymal echogenicity. Portal vein is patent on color Doppler imaging with normal direction of blood flow towards the liver. Other: Large volume ascites. IMPRESSION: 1. Cirrhosis. No focal  liver lesions identified by ultrasound. Please note that liver protocol enhanced MR and CT are the most sensitive tests for the screening detection of hepatocellular carcinoma in the high risk setting of cirrhosis. 2. Ascites. 3. Cholelithiasis with no acute cholecystitis. Electronically Signed   By: Iven Finn M.D.   On: 07/17/2021 20:51   CT LUMBAR SPINE W CONTRAST  Result Date: 07/17/2021 CLINICAL DATA:  Bacteremia, concern for abscess in spine EXAM: CT LUMBAR SPINE WITH CONTRAST TECHNIQUE: Multidetector CT imaging of the lumbar spine was performed with intravenous contrast administration. RADIATION DOSE REDUCTION: This exam was performed according to the departmental dose-optimization program which includes automated exposure control, adjustment of the mA and/or kV according to patient size and/or use of iterative reconstruction technique. CONTRAST:  23m OMNIPAQUE IOHEXOL 300 MG/ML  SOLN COMPARISON:  06/26/2021 MRI lumbar spine 06/18/2021 CT lumbar spine FINDINGS: Segmentation: 5 lumbar type vertebrae. Alignment: Normal. Vertebrae: Redemonstrated subacute severe compression fracture of L1 with retropulsion of the posterosuperior cortex, which causes mild canal encroachment, better seen on the 06/26/2021 MRI. The previously noted cleft filled with gas on the prior CT is no longer seen, with fluid seen in this area on the MRI. As seen on the prior exams, there are endplate changes at LQ2-V9with sclerosis in the adjacent vertebral bodies and erosive changes at the inferior endplate of L5 but not S1. The cortical contours appear unchanged compared to 06/18/2021. Previously noted air within the disc space is no longer seen. There is suggestion of some soft tissue extending from the disc space along the anterior aspect of S1 (series 9, image 36). No acute fracture or additional endplate changes. No suspicious osseous lesion. Paraspinal and other soft tissues: Abnormal enhancement in the soft tissues posterior  to the lumbar spine, although there is mild soft tissue edema, as seen on the prior MRI. No definite enhancing epidural collection or abnormal enhancement in the disc spaces. Aortic atherosclerosis. Tortuous vasculature in the left perirectal pelvis, which is better evaluated on the 07/16/2021 CTA. Disc levels: T12-L1: No significant disc bulge. No spinal canal stenosis or neural foraminal narrowing. L1-L2: No significant disc bulge. No spinal canal stenosis or neural foraminal narrowing. L2-L3: Mild disc bulge. No spinal canal stenosis or neural foraminal narrowing. L3-L4: Mild-to-moderate disc bulge. Mild spinal canal stenosis. No significant neural foraminal narrowing. L4-L5: Disc height loss and left-greater-than-right disc osteophyte complex. Mild spinal canal stenosis. Narrowing of the lateral recesses. Mild right-greater-than-left neural foraminal narrowing. L5-S1: Broad-based disc bulge. Mild spinal canal stenosis. Narrowing of the lateral recesses. Moderate left-greater-than-right neural foraminal narrowing. IMPRESSION: 1. Redemonstrated erosive changes at the inferior endplate of L5 with preservation of the S1 superior endplate. Endplate contours appear unchanged compared to the prior CT. Stability favors a non infectious process; however previously noted air in the disc space, which is not often seen with infection, is no longer seen, and there is a suggestion of some  soft tissue extending from the disc space along the anterior aspect of S1, which remains concerning for an infectious process. No abnormal enhancement. If the patient is able to get a repeat MRI, a repeat MRI with and without contrast is recommended. 2. Unchanged L1 compression fracture which causes mild canal encroachment, but no significant spinal canal stenosis. 3. Otherwise unchanged degenerative findings compared to 06/18/2021. Electronically Signed   By: Merilyn Baba M.D.   On: 07/17/2021 18:44     Assessment/Plan: Sepsis =  unclear if we have source control since having increase lactate despite appropriate abtx for salmonella bacteremia, and repeat blood cx  at 32 hrs are no growth to date. 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 Troponin pending     [facility] for Infectious Diseases Pager: [phone]  07/18/2021, 3:37 PM

## 2021-07-18 NOTE — Progress Notes (Signed)
eLink Physician-Brief Progress Note Patient Name: [name] DOB: [birth year] MRN: [id number]   Date of Service  07/18/2021  HPI/Events of Note  CVP 15, CXR shows pulmonary edema.  eICU Interventions  Intravenous fluids discontinued, Lasix 20 mg iv x 1 now. Strict I's and O's.        Frederik Pear 07/18/2021, 9:39 PM

## 2021-07-18 NOTE — Progress Notes (Signed)
Progress Note  Lactic acid is now higher  Troponin is 234 which may be demand Giving additional IVF, albumin Discussed with ID. Broaden antibiotics to vanc, amp/sub and follow stat CT chest abd, pelvis when complete.  Marshell Garfinkel MD [facility] See Amion for pager  If no response to pager , please call [phone] until 7pm After 7:00 pm call Elink  [phone] 07/18/2021, 6:03 PM

## 2021-07-18 NOTE — Progress Notes (Signed)
ANTICOAGULATION CONSULT NOTE  Pharmacy Consult for Heparin  Indication: pulmonary embolus  No Known Allergies  Patient Measurements: Weight: 95 kg (209 lb 7 oz) Heparin dosing weight = 83 kg  Vital Signs: Temp: 97.5 F (36.4 C) (06/08 2331) Temp Source: Oral (06/08 2331) Pulse Rate: 112 (06/08 1815)  Labs: Recent Labs    07/16/2021 1915 08/02/2021 2051 07/16/21 0050 07/16/21 1205 07/17/21 0358 07/17/21 1320 07/17/21 2308  HGB 17.0 15.6 15.4  --  15.1  --   --   HCT 52.4* 46.0 45.1  --  44.1  --   --   PLT 258  --  225  --  186  --   --   APTT 30  --   --   --   --   --   --   LABPROT 17.6*  --   --   --   --   --   --   INR 1.5*  --   --   --   --   --   --   HEPARINUNFRC  --   --   --    < > 0.12* 0.20* 0.24*  CREATININE 2.14*  --  2.30*  --  1.59*  --   --   CKTOTAL 114  --   --   --   --   --   --    < > = values in this interval not displayed.     Estimated Creatinine Clearance: 44.2 mL/min (A) (by C-G formula based on SCr of 1.59 mg/dL (H)).  Assessment: 71 y/o M with AMS from sepsis secondary to UTI vs wound.  Also found to have small PE on CTA and Pharmacy consulted to dose IV heparin.  Heparin level subtherapeutic 0.24 units/mL on 1850 units/hr.  No issue with heparin infusion nor bleeding.  Goal of Therapy:  Heparin level 0.3-0.7 units/ml Monitor platelets by anticoagulation protocol: Yes   Plan:  Increase heparin drip to 2100 units/hr Check 8 hr heparin level Daily heparin level and CBC Monitor closely for s/sx of bleeding  Georga Bora, [name] Clinical Pharmacist 07/18/2021 12:14 AM Please check AMION for all [place] numbers

## 2021-07-18 NOTE — Progress Notes (Signed)
eLink Physician-Brief Progress Note Patient Name: Devin Hanna DOB: 06-Feb-1951 MRN: [id number]   Date of Service  07/18/2021  HPI/Events of Note  Patient clinically looks like he is tiring out, and transiently dropped his heart rate a [place] while ago, ABG is fair, with a moderate metabolic acidosis being the main finding, lactic acid is slightly improved but remains high at 8.0, troponin is also elevated from baseline, he is on Lovenox already for PE.  eICU Interventions  Bicarb 25 meq iv x 1 ordered for the metabolic acidosis, PCCM ground crew requested to evaluate him at bedside for possibly needing intubation to allow focus to be on resuscitation.        Kerry Kass Yeriel Mineo 07/18/2021, 11:38 PM

## 2021-07-18 NOTE — Progress Notes (Addendum)
ANTICOAGULATION CONSULT NOTE  Pharmacy Consult for Heparin >> enoxaparin Indication: pulmonary embolus  No Known Allergies  Patient Measurements: Weight: 95 kg (209 lb 7 oz) Heparin dosing weight = 83 kg  Vital Signs: Temp: 98.3 F (36.8 C) (06/09 0723) Temp Source: Oral (06/09 0723) BP: 85/67 (06/09 0800) Pulse Rate: 115 (06/09 0800)  Labs: Recent Labs    08/05/2021 1915 07/25/2021 2051 07/16/21 0050 07/16/21 1205 07/17/21 0358 07/17/21 1320 07/17/21 2308 07/18/21 0350 07/18/21 0451  HGB 17.0   < > 15.4  --  15.1  --   --  14.7 15.0  HCT 52.4*   < > 45.1  --  44.1  --   --  42.1 44.0  PLT 258  --  225  --  186  --   --  159  --   APTT 30  --   --   --   --   --   --   --   --   LABPROT 17.6*  --   --   --   --   --   --   --   --   INR 1.5*  --   --   --   --   --   --   --   --   HEPARINUNFRC  --   --   --    < > 0.12* 0.20* 0.24*  --   --   CREATININE 2.14*  --  2.30*  --  1.59*  --   --  1.35*  --   CKTOTAL 114  --   --   --   --   --   --   --   --    < > = values in this interval not displayed.     Estimated Creatinine Clearance: 52.1 mL/min (A) (by C-G formula based on SCr of 1.35 mg/dL (H)).  Assessment: 71 y/o M with AMS from sepsis secondary to UTI vs wound.  Also found to have small PE on CTA and Pharmacy consulted to dose IV heparin.  Goal of Therapy:  Heparin level 0.3-0.7 units/ml Monitor platelets by anticoagulation protocol: Yes   Plan:  Transitioning heparin drip to enoxaparin 73m q12hr Plan to transition to DElk Fallswhen patient is able to swallow.  Continue to monitor for signs/symptoms of bleed along with renal function  Thank you for allowing pharmacy to be a part of this patient's care.  EDonnald Garre [name] Clinical Pharmacist  Please check AMION for all [place]numbers After 10:00 PM, call [phone]

## 2021-07-18 NOTE — Progress Notes (Signed)
PCCM note  Called to the bedside as patient is more tremulous and complains of lower chest/epigastric pain Lactic acid increased to 8.2 in afternoon labs  Order EKG, troponin Will attempt CT abd pelvis but I am sure if he can stay still for the procedure. Abd x ray if we cannot get CT Additional fluid bolus and drip with NS and follow lactic acid.  Marshell Garfinkel MD [facility] See Amion for pager  If no response to pager , please call [phone] until 7pm After 7:00 pm call Elink  [phone] 07/18/2021, 2:50 PM

## 2021-07-18 NOTE — Progress Notes (Signed)
eLink Physician-Brief Progress Note Patient Name: Devin Hanna DOB: February 14, 1950 MRN: [id number]   Date of Service  07/18/2021  HPI/Events of Note  Patient with shortness of breath and increase in oxygen requirements from room air to 4 l / m via nasal cannula, he denies chest pain, but he is a known diabetic with a prior history of coronary artery disease. He is on Lovenox for PE, in addition to having salmonella bacteremia and sepsis.  eICU Interventions  Stat portable CXR, ABG , and EKG ordered. Will hold transport to CT pending review of results, will also check a BNP to gauge volume status.        Kerry Kass Reily Ilic 07/18/2021, 8:51 PM

## 2021-07-18 NOTE — Progress Notes (Incomplete)
NAME:  TAESHAWN HELFMAN, MRN:  [id number], DOB:  [birth year], LOS: 3 ADMISSION DATE:  08/03/2021, CONSULTATION DATE:  07/10/2021 REFERRING MD:  EDP, CHIEF COMPLAINT:  AMS/fall   History of Present Illness:  71 yo male with pmh cad, dm2, obesity, htn, pud, ckdIIIa, paf, cirrhosis, recently dx's hepatocellular carcinoma s/p radiation therapy presented today after being found by his neighbor on the bathroom floor unable to get up. Pt states he was urinating and when he finished he became slightly light headed and tripped over his feet vs pass out. He is unclear about the specifics. When EMS arrived however he estimated he had been on the floor for approx. 30 mins. He was aaox3 and complaining of back pain (which is also chronic). He states he has been in his usual state of health prior to this event.   While being transported to [facility] pt became more altered and upon arrival was unable to communicate his own name. Pt baseline is independent, walks with walker, aaox3. He was taken for cth which was negative for acute process. Pt's labs were markedly abnormal and multiple hours after presentation while his mental status was improving his BP dropped to systolics in 07'P. He was started on norepi and subsequently vasopressin to maintain BP. His lactate cont to climb and he was admitted to ICU.   Pt states he is feeling better since presentation. He denies recent illness, stating "ive felt ok" no n/v/d no urinary symptoms (despite reports of foul smelling urine when he was found and sediment upon our catheterization). Denies fevers, daughter at bedside did endorse he likely was having chills as it was hot 3-4 days ago and while she was sweating he was shivering under 2-3 blankets. She states he didn't feel necessarily warm but no temperature was taken. Pt states he has been compliant with all of his m edications, eating and drinking well. He denies any pain with exception of his back which is chronic.   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 rate (!) 37, weight 95 kg, SpO2 100 %. CVP:  [66 mmHg-88 mmHg] 81 mmHg      Intake/Output Summary (Last 24 hours) at 07/18/2021 0900 Last data filed at 07/18/2021 0800 Gross per 24 hour  Intake 3361.91 ml  Output 200 ml  Net 3161.91 ml   Filed Weights   07/13/2021 2355  Weight: 95 kg    Examination: General: In bed, NAD, appears comfortable HEENT: MM pink/moist, anicteric, ecchymosis to face Neuro: RASS 0, PERRL 107m, GCS 15 CV: S1S2, Afib, no m/r/g appreciated PULM:  clear in the upper lobes, clear in the lower lobes, trachea midline, chest expansion symmetric GI: soft, bsx4 active, non-tender   Extremities: warm/dry, +2 pretibial edema, capillary refill less than 3 seconds  Skin: Abrasions on arms, no other wounds or rashes noted.  Labs Labs: Lactate 6.1>4.8>5.1 WBC 32.7>21.6 NA 133>134>133 AG 16, CO2 18 K 3.5 Creat 2.3>1.59, BUN 30 MG 2.0 Dopplers negative ECHO: LVEF 30 to 35%, RVSF within  normal limits   Resolved Hospital Problem list     Assessment & Plan:  Septic shock, secondary to Salmonella bacteremia Present on blood cultures 6/6. On levophed. S/P 3000 ml fluid resusitation. Levophed requirements stable.  Unclear if Salmonella related to biliary tree. -Goal MAP 65 or greater. Levophed ordered. Titrate medication to goal -New set of blood cultures obtained per ID follow-up results.  Patient may need lumbar spine MRI pending culture results per ID. -Continue  Rocephin -Continue trending lactate  -Appreciate infectious disease assistance.  Pulmonary embolus, segmental, right lower lobe Suspect secondary to history of cancer, recent trauma.  Bilateral lower extremity Dopplers negative.  Echo without evidence of right heart strain. -Continue heparin drip -We will plan to switch to Lovenox once AKI improves  Chronic HFrEF Afib, with RVR, not on AC PTA Icd in place ECHO: LVEF 30 to 35%, RVSF within normal limits.  Patient developed A-fib with RVR overnight.  Started on amio drip -Continue amiodarone drip -Goal K above 4, goal MG above 2 -Continue telemetry   Acute metabolic encephalopathy, suspect secondary to sepsis, delirium Head CT negative for acute process, given benzodiazepines overnight by EMD. -Continue supportive care -Frequent reorientation -Monitor neuro exam -Goal MAP 65   Acute kidney injury suspect prerenal secondary to sepsis and shock. Improving. -Ensure renal perfusion. Goal MAP 65 or greater. -Avoid neprotoxic drugs as possible. -Strict I&O's -Follow up AM creatinine   Hyponatremia NA 161>096>045 -No free water -Monitor on a.m. BMP  Transaminitis Elevated alk phos Suspect chronically elevated since malignancy diagnosis, also elevated secondary to shock. -Follow LFT -Continue supportive care  Metabolic/lactic acidosis pt initally was not hypotensive and his lactate cont to climb despite some ivf. Bicarb given in ED.  -Continue to trend lactic acid -Supportive care -Goal MAP 65  LE edema:  R>>L (does have hardware in R).  Venous duplex negative suspect secondary to cardiac history. -Continue monitoring  Fall cth and c-spine negative for acute process -PT/OT when appropriate  Hepatocellular carcinoma:  not surgical candidate had radiation beads placed with good response per chart reivew. 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  DM2 -Blood Glucose goal 140-180. -SSI   Best Practice (right click and "Reselect all SmartList Selections" daily)   Diet/type: clear DVT prophylaxis: systemic heparin GI prophylaxis: PPI Lines: Central line and yes and it is still needed Foley:  Yes, and it is still needed Code Status:  full code Last date of multidisciplinary goals of care discussion [07/17/21 with pt and daughter at bedside]  Critical care time: 39 min    [employer]., MSN, APRN, AGACNP-BC [facility]  07/18/2021 , 9:00 AM  Please see [email or web address] for pager details  If no response, please call [phone] After hours, please call Elink at [phone]

## 2021-07-19 ENCOUNTER — Inpatient Hospital Stay (HOSPITAL_COMMUNITY): Payer: PPO

## 2021-07-19 DIAGNOSIS — G9341 Metabolic encephalopathy: Secondary | ICD-10-CM | POA: Diagnosis not present

## 2021-07-19 DIAGNOSIS — R6521 Severe sepsis with septic shock: Secondary | ICD-10-CM | POA: Diagnosis not present

## 2021-07-19 DIAGNOSIS — J9601 Acute respiratory failure with hypoxia: Secondary | ICD-10-CM

## 2021-07-19 DIAGNOSIS — A419 Sepsis, unspecified organism: Secondary | ICD-10-CM | POA: Diagnosis not present

## 2021-07-19 LAB — COMPREHENSIVE METABOLIC PANEL
ALT: 175 U/L — ABNORMAL HIGH (ref 0–44)
AST: 386 U/L — ABNORMAL HIGH (ref 15–41)
Albumin: 2.2 g/dL — ABNORMAL LOW (ref 3.5–5.0)
Alkaline Phosphatase: 388 U/L — ABNORMAL HIGH (ref 38–126)
Anion gap: 20 — ABNORMAL HIGH (ref 5–15)
BUN: 33 mg/dL — ABNORMAL HIGH (ref 8–23)
CO2: 10 mmol/L — ABNORMAL LOW (ref 22–32)
Calcium: 7.5 mg/dL — ABNORMAL LOW (ref 8.9–10.3)
Chloride: 102 mmol/L (ref 98–111)
Creatinine, Ser: 2.06 mg/dL — ABNORMAL HIGH (ref 0.61–1.24)
GFR, Estimated: 33 mL/min — ABNORMAL LOW (ref >60)
Glucose, Bld: 65 mg/dL — ABNORMAL LOW (ref 70–99)
Potassium: 4.9 mmol/L (ref 3.5–5.1)
Sodium: 132 mmol/L — ABNORMAL LOW (ref 135–145)
Total Bilirubin: 2.5 mg/dL — ABNORMAL HIGH (ref 0.3–1.2)
Total Protein: 5 g/dL — ABNORMAL LOW (ref 6.5–8.1)

## 2021-07-19 LAB — POCT I-STAT 7, (LYTES, BLD GAS, ICA,H+H)
Acid-base deficit: 17 mmol/L — ABNORMAL HIGH (ref 0.0–2.0)
Acid-base deficit: 18 mmol/L — ABNORMAL HIGH (ref 0.0–2.0)
Bicarbonate: 10.8 mmol/L — ABNORMAL LOW (ref 20.0–28.0)
Bicarbonate: 10 mmol/L — ABNORMAL LOW (ref 20.0–28.0)
Calcium, Ion: 0.98 mmol/L — ABNORMAL LOW (ref 1.15–1.40)
Calcium, Ion: 0.95 mmol/L — ABNORMAL LOW (ref 1.15–1.40)
HCT: 36 % — ABNORMAL LOW (ref 39.0–52.0)
HCT: 39 % (ref 39.0–52.0)
Hemoglobin: 12.2 g/dL — ABNORMAL LOW (ref 13.0–17.0)
Hemoglobin: 13.3 g/dL (ref 13.0–17.0)
O2 Saturation: 98 %
O2 Saturation: 93 %
Patient temperature: 97.3
Potassium: 4.6 mmol/L (ref 3.5–5.1)
Potassium: 4.6 mmol/L (ref 3.5–5.1)
Sodium: 133 mmol/L — ABNORMAL LOW (ref 135–145)
Sodium: 132 mmol/L — ABNORMAL LOW (ref 135–145)
TCO2: 12 mmol/L — ABNORMAL LOW (ref 22–32)
TCO2: 11 mmol/L — ABNORMAL LOW (ref 22–32)
pCO2 arterial: 30.8 mmHg — ABNORMAL LOW (ref 32–48)
pCO2 arterial: 28.1 mmHg — ABNORMAL LOW (ref 32–48)
pH, Arterial: 7.153 — CL (ref 7.35–7.45)
pH, Arterial: 7.153 — CL (ref 7.35–7.45)
pO2, Arterial: 134 mmHg — ABNORMAL HIGH (ref 83–108)
pO2, Arterial: 81 mmHg — ABNORMAL LOW (ref 83–108)

## 2021-07-19 LAB — CBC
HCT: 40.5 % (ref 39.0–52.0)
Hemoglobin: 13.4 g/dL (ref 13.0–17.0)
MCH: 33.5 pg (ref 26.0–34.0)
MCHC: 33.1 g/dL (ref 30.0–36.0)
MCV: 101.3 fL — ABNORMAL HIGH (ref 80.0–100.0)
Platelets: 162 10*3/uL (ref 150–400)
RBC: 4 MIL/uL — ABNORMAL LOW (ref 4.22–5.81)
RDW: 16.6 % — ABNORMAL HIGH (ref 11.5–15.5)
WBC: 19.6 10*3/uL — ABNORMAL HIGH (ref 4.0–10.5)
nRBC: 0.5 % — ABNORMAL HIGH (ref 0.0–0.2)

## 2021-07-19 LAB — COOXEMETRY PANEL
Carboxyhemoglobin: 0.9 % (ref 0.5–1.5)
Methemoglobin: <0.7 % (ref 0.0–1.5)
O2 Saturation: 79.3 %
Total hemoglobin: 12.6 g/dL (ref 12.0–16.0)

## 2021-07-19 LAB — GLUCOSE, CAPILLARY
Glucose-Capillary: 221 mg/dL — ABNORMAL HIGH (ref 70–99)
Glucose-Capillary: 53 mg/dL — ABNORMAL LOW (ref 70–99)
Glucose-Capillary: 72 mg/dL (ref 70–99)
Glucose-Capillary: 52 mg/dL — ABNORMAL LOW (ref 70–99)
Glucose-Capillary: 151 mg/dL — ABNORMAL HIGH (ref 70–99)

## 2021-07-19 LAB — TROPONIN I (HIGH SENSITIVITY): Troponin I (High Sensitivity): 1251 ng/L (ref <18)

## 2021-07-19 LAB — LACTIC ACID, PLASMA: Lactic Acid, Venous: >9 mmol/L (ref 0.5–1.9)

## 2021-07-19 MED ORDER — POLYVINYL ALCOHOL 1.4 % OP SOLN: 1.0000 [drp] | Freq: Four times a day (QID) | OPHTHALMIC | Status: DC | PRN

## 2021-07-19 MED ORDER — GLYCOPYRROLATE 0.2 MG/ML IJ SOLN: 0.2000 mg | INTRAMUSCULAR | Status: DC | PRN

## 2021-07-19 MED ORDER — SUCCINYLCHOLINE CHLORIDE 200 MG/10ML IV SOSY
PREFILLED_SYRINGE | INTRAVENOUS | Status: AC
  Filled 2021-07-19: qty 10

## 2021-07-19 MED ORDER — ETOMIDATE 2 MG/ML IV SOLN
INTRAVENOUS | Status: AC
  Filled 2021-07-19: qty 20

## 2021-07-19 MED ORDER — VANCOMYCIN VARIABLE DOSE PER UNSTABLE RENAL FUNCTION (PHARMACIST DOSING): Status: DC

## 2021-07-19 MED ORDER — KETAMINE HCL 50 MG/5ML IJ SOSY
PREFILLED_SYRINGE | INTRAMUSCULAR | Status: AC
  Filled 2021-07-19: qty 5

## 2021-07-19 MED ORDER — EPINEPHRINE HCL 5 MG/250ML IV SOLN IN NS
0.5000 ug/min | INTRAVENOUS | Status: DC
  Administered 2021-07-19: 0.5 ug/min via INTRAVENOUS
  Filled 2021-07-19: qty 250

## 2021-07-19 MED ORDER — ROCURONIUM BROMIDE 10 MG/ML (PF) SYRINGE
PREFILLED_SYRINGE | INTRAVENOUS | Status: AC
  Administered 2021-07-19: 100 mg via INTRAVENOUS
  Filled 2021-07-19: qty 10

## 2021-07-19 MED ORDER — PANTOPRAZOLE SODIUM 40 MG IV SOLR
40.0000 mg | Freq: Two times a day (BID) | INTRAVENOUS | Status: DC
  Administered 2021-07-19: 40 mg via INTRAVENOUS
  Filled 2021-07-19: qty 10

## 2021-07-19 MED ORDER — FENTANYL 2500MCG IN NS 250ML (10MCG/ML) PREMIX INFUSION
50.0000 ug/h | INTRAVENOUS | Status: DC
  Administered 2021-07-19: 50 ug/h via INTRAVENOUS

## 2021-07-19 MED ORDER — SODIUM BICARBONATE 8.4 % IV SOLN
INTRAVENOUS | Status: AC
  Administered 2021-07-19: 50 meq via INTRAVENOUS
  Filled 2021-07-19: qty 50

## 2021-07-19 MED ORDER — FENTANYL CITRATE (PF) 100 MCG/2ML IJ SOLN: 50.0000 ug | Freq: Once | INTRAMUSCULAR | Status: AC

## 2021-07-19 MED ORDER — CALCIUM GLUCONATE-NACL 2-0.675 GM/100ML-% IV SOLN
2.0000 g | Freq: Once | INTRAVENOUS | Status: AC
  Administered 2021-07-19: 2000 mg via INTRAVENOUS
  Filled 2021-07-19: qty 100

## 2021-07-19 MED ORDER — DEXTROSE 5 % IV SOLN: INTRAVENOUS | Status: DC

## 2021-07-19 MED ORDER — THIAMINE HCL 100 MG/ML IJ SOLN
100.0000 mg | Freq: Every day | INTRAMUSCULAR | Status: DC
  Administered 2021-07-19: 100 mg via INTRAVENOUS
  Filled 2021-07-19: qty 2

## 2021-07-19 MED ORDER — SODIUM BICARBONATE 8.4 % IV SOLN
50.0000 meq | Freq: Once | INTRAVENOUS | Status: AC
Start: 2021-07-19 — End: 2021-07-19

## 2021-07-19 MED ORDER — FENTANYL CITRATE (PF) 100 MCG/2ML IJ SOLN: 50.0000 ug | Freq: Once | INTRAMUSCULAR | Status: DC

## 2021-07-19 MED ORDER — SODIUM BICARBONATE 8.4 % IV SOLN
INTRAVENOUS | Status: DC
  Filled 2021-07-19 (×2): qty 1000

## 2021-07-19 MED ORDER — EPINEPHRINE 1 MG/10ML IJ SOSY
PREFILLED_SYRINGE | INTRAMUSCULAR | Status: AC
  Filled 2021-07-19: qty 10

## 2021-07-19 MED ORDER — MIDAZOLAM HCL 2 MG/2ML IJ SOLN
INTRAMUSCULAR | Status: AC
  Administered 2021-07-19: 2 mg via INTRAVENOUS
  Filled 2021-07-19: qty 2

## 2021-07-19 MED ORDER — MORPHINE 100MG IN NS 100ML (1MG/ML) PREMIX INFUSION
0.0000 mg/h | INTRAVENOUS | Status: DC
  Administered 2021-07-19: 5 mg/h via INTRAVENOUS
  Filled 2021-07-19: qty 100

## 2021-07-19 MED ORDER — ACETAMINOPHEN 325 MG PO TABS: 650.0000 mg | ORAL_TABLET | Freq: Four times a day (QID) | ORAL | Status: DC | PRN

## 2021-07-19 MED ORDER — DOCUSATE SODIUM 50 MG/5ML PO LIQD
100.0000 mg | Freq: Two times a day (BID) | ORAL | Status: DC
  Filled 2021-07-19: qty 10

## 2021-07-19 MED ORDER — POLYETHYLENE GLYCOL 3350 17 G PO PACK
17.0000 g | PACK | Freq: Every day | ORAL | Status: DC
  Filled 2021-07-19: qty 1

## 2021-07-19 MED ORDER — ACETAMINOPHEN 650 MG RE SUPP: 650.0000 mg | Freq: Four times a day (QID) | RECTAL | Status: DC | PRN

## 2021-07-19 MED ORDER — SODIUM BICARBONATE 8.4 % IV SOLN
INTRAVENOUS | Status: AC
  Filled 2021-07-19: qty 50

## 2021-07-19 MED ORDER — GLYCOPYRROLATE 1 MG PO TABS: 1.0000 mg | ORAL_TABLET | ORAL | Status: DC | PRN

## 2021-07-19 MED ORDER — SODIUM BICARBONATE 8.4 % IV SOLN
50.0000 meq | Freq: Once | INTRAVENOUS | Status: AC
  Administered 2021-07-19: 50 meq via INTRAVENOUS

## 2021-07-19 MED ORDER — MORPHINE SULFATE (PF) 2 MG/ML IV SOLN: 2.0000 mg | INTRAVENOUS | Status: DC | PRN

## 2021-07-19 MED ORDER — MORPHINE BOLUS VIA INFUSION
5.0000 mg | INTRAVENOUS | Status: DC | PRN
  Administered 2021-07-19: 5 mg via INTRAVENOUS

## 2021-07-19 MED ORDER — FENTANYL CITRATE (PF) 100 MCG/2ML IJ SOLN
INTRAMUSCULAR | Status: AC
  Administered 2021-07-19: 50 ug via INTRAVENOUS
  Filled 2021-07-19: qty 2

## 2021-07-19 MED ORDER — FENTANYL BOLUS VIA INFUSION: 50.0000 ug | INTRAVENOUS | Status: DC | PRN

## 2021-07-19 MED ORDER — ROCURONIUM BROMIDE 10 MG/ML (PF) SYRINGE: 100.0000 mg | PREFILLED_SYRINGE | Freq: Once | INTRAVENOUS | Status: AC

## 2021-07-19 MED ORDER — FENTANYL 2500MCG IN NS 250ML (10MCG/ML) PREMIX INFUSION
INTRAVENOUS | Status: AC
  Filled 2021-07-19: qty 250

## 2021-07-19 MED ORDER — DEXTROSE 50 % IV SOLN: 25.0000 g | INTRAVENOUS | Status: AC

## 2021-07-19 MED ORDER — AMPICILLIN-SULBACTAM SODIUM 3 (2-1) G IJ SOLR: 3.0000 g | Freq: Two times a day (BID) | INTRAMUSCULAR | Status: DC

## 2021-07-19 MED ORDER — MIDAZOLAM HCL 2 MG/2ML IJ SOLN: 2.0000 mg | Freq: Once | INTRAMUSCULAR | Status: AC

## 2021-07-19 MED ORDER — DEXTROSE 50 % IV SOLN
INTRAVENOUS | Status: AC
  Administered 2021-07-19: 25 g via INTRAVENOUS
  Filled 2021-07-19: qty 50

## 2021-07-19 MED ORDER — DIPHENHYDRAMINE HCL 50 MG/ML IJ SOLN: 25.0000 mg | INTRAMUSCULAR | Status: DC | PRN

## 2021-07-21 LAB — CULTURE, BLOOD (ROUTINE X 2)

## 2021-07-22 LAB — CULTURE, BLOOD (ROUTINE X 2)
Culture: NO GROWTH
Culture: NO GROWTH
Special Requests: ADEQUATE

## 2021-07-24 ENCOUNTER — Telehealth: Payer: PPO

## 2021-07-24 NOTE — Addendum Note (Signed)
Addended by: Douglass Rivers D on: 07/24/2021 11:25 AM   Modules accepted: Level of Service

## 2021-07-24 NOTE — Progress Notes (Signed)
Remote ICD transmission.   

## 2021-08-01 ENCOUNTER — Ambulatory Visit: Payer: PPO | Admitting: Family Medicine

## 2021-08-02 LAB — CULTURE, BLOOD (ROUTINE X 2)

## 2021-08-05 LAB — MISCELLANEOUS TEST

## 2021-08-09 NOTE — Progress Notes (Signed)
Entered patient room with Lucile Crater RN. No heart sounds for 1 min, no breathing sounds, pupils unresponsive. Same assessment repeated by both RN's. Patient declared deceased at 51. Family at bedside. Dr. Vaughan Browner notified.

## 2021-08-09 NOTE — Progress Notes (Signed)
eLink Physician-Brief Progress Note Patient Name: Devin Hanna DOB: 05-28-1950 MRN: [id number]   Date of Service  07-27-2021  HPI/Events of Note  Patient with 650 ml of coffee grounds-like output from the OG tube.  eICU Interventions  Protonix 40 mg  via OG tube Q 12 hours ordered.        Kerry Kass Kanin Lia Jul 27, 2021, 5:50 AM

## 2021-08-09 NOTE — Progress Notes (Signed)
NAME:  Devin Hanna, MRN:  [id number], DOB:  January 14, 1951, LOS: 4 ADMISSION DATE:  08/03/2021, CONSULTATION DATE:  07/16/2021 REFERRING MD:  EDP, CHIEF COMPLAINT:  AMS/fall   History of Present Illness:  71 yo male with pmh cad, dm2, obesity, htn, pud, ckdIIIa, paf, cirrhosis, recently dx's hepatocellular carcinoma s/p radiation therapy presented today after being found by his neighbor on the bathroom floor unable to get up. Pt states he was urinating and when he finished he became slightly light headed and tripped over his feet vs pass out. He is unclear about the specifics. When EMS arrived however he estimated he had been on the floor for approx. 30 mins. He was aaox3 and complaining of back pain (which is also chronic). He states he has been in his usual state of health prior to this event.   While being transported to [facility] pt became more altered and upon arrival was unable to communicate his own name. Pt baseline is independent, walks with walker, aaox3. He was taken for cth which was negative for acute process. Pt's labs were markedly abnormal and multiple hours after presentation while his mental status was improving his BP dropped to systolics in 68'E. He was started on norepi and subsequently vasopressin to maintain BP. His lactate cont to climb and he was admitted to ICU.   Pt states he is feeling better since presentation. He denies recent illness, stating "ive felt ok" no n/v/d no urinary symptoms (despite reports of foul smelling urine when he was found and sediment upon our catheterization). Denies fevers, daughter at bedside did endorse he likely was having chills as it was hot 3-4 days ago and while she was sweating he was shivering under 2-3 blankets. She states he didn't feel necessarily warm but no temperature was taken. Pt states he has been compliant with all of his medications, eating and drinking well. He denies any pain with exception of his back which is chronic.    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  Intubated overnight for respiratory protection.  He aspirated during intubation Started on bicarb drip and epinephrine.  Now has coffee-ground output from OG tube  Objective   Blood pressure (!) 77/47, pulse (!) 101, temperature (!) 97.3 F (36.3 C), resp. rate (!) 30, weight 95 kg, SpO2 (!) 75 %. CVP:  [7 mmHg-71 mmHg] 12 mmHg  Vent Mode: PRVC FiO2 (%):  [100 %] 100 % Set Rate:  [30 bmp] 30 bmp Vt Set:  [500 mL] 500 mL PEEP:  [5 cmH20] 5 cmH20 Plateau Pressure:  [20 cmH20] 20 cmH20   Intake/Output Summary (Last 24 hours) at 08-09-21 0752 Last data filed at 08/09/2021 0700 Gross per 24 hour  Intake 4436.71 ml  Output 575 ml  Net 3861.71 ml   Filed Weights   07/22/2021 2355  Weight: 95 kg   Examination: Gen:      No acute distress HEENT:  EOMI, sclera anicteric Neck:     No masses; no thyromegaly, ETT Lungs:    Clear to auscultation bilaterally; normal respiratory  effort CV:         Regular rate and rhythm; no murmurs Abd:      + bowel sounds; soft, non-tender; no palpable masses, no distension Ext:    No edema; adequate peripheral perfusion Skin:      Warm and dry; no rash Neuro: Sedated, unresponsive  Labs/imaging personally reviewed Significant for sodium 133, creatinine 2.06 AST 386, ALT 175 Troponin 1251, lactic acid greater than 9 WBC 19.6, hemoglobin 13.4, platelets 162  CT abdomen pelvis on 6/10 reviewed with   Resolved Hospital Problem list     Assessment & Plan:  Septic shock present on  admission, secondary to Salmonella bacteremia, UTI Has worsening multiorgan failure and likely aspirated at some point with new left lower lobe pneumonia Severe acidosis with lactic acid is a poor prognosis On 3 pressors now and maxed out. MRI ordered to evaluate lumbar spine but he is too unstable to go down at this point  Pulmonary embolus, segmental, right lower lobe Suspect secondary to history of cancer, recent trauma.  Bilateral lower extremity Dopplers negative.  Echo without evidence of right heart strain. We will need to stop anticoagulation as he may have a GI bleed now  Coffee-ground in OG tube, suspected GI bleed PPI twice daily.  He is not stable for EGD Stop anticoagulation.  Follow CBC  Chronic HFrEF Afib, with RVR, not on AC PTA Continue amiodarone, pressors as above Holding anticoagulation Although EF is low his venous saturation is adequate and I do not suspect a big cardiac component to shock Elevated troponins are likely demand ischemia.  Monitor labs  Acute metabolic encephalopathy, suspect secondary to sepsis, delirium Negative head CT Monitor  Acute kidney injury secondary to ATN from sepsis Severe acidosis Continue bicarb drip, monitor urine output and creatinine  Elevated LFTs Labs are higher today secondary to shock liver.  Right upper quadrant ultrasound noted on admission with chronic cirrhosis.  LE edema No DVT on ultrasound 6/7  [facility] and CT spine was negative for acute process Continue to monitor  Hepatocellular carcinoma:  not surgical candidate had radiation beads placed with good response per chart reivew. There was potential areas of residual cancer, f/u imaging was to be in April but he has it rescheduled 2/2 inability to lay flat  DM2 SSI coverage  Goals of care Discussed his deterioration in clinical status with daughter at bedside.  He is now in multiorgan failure, maxed out on pressors and is anuric.  Prognosis for recovery is  extremely poor.  His daughter tells me that he would not want to be supported like this if there is no chance of recovery to baseline or quality of life at home.  He may not survive the next 24 hours and we discussed withdrawal of care but they are not ready to make that step just yet.  We have decided to make him limited code with no CPR in case he codes.   Best Practice (right click and "Reselect all SmartList Selections" daily)   Diet/type: NPO DVT prophylaxis: SCD GI prophylaxis: PPI Lines: Central line and yes and it is still needed Foley:  Yes, and it is still needed Code Status:  limited Last date of multidisciplinary goals of care discussion: See above  Critical care time:   The patient is critically ill with multiple organ system failure and requires high complexity decision making for assessment and support, frequent evaluation and titration of therapies, advanced monitoring, review of radiographic studies and interpretation of  complex data.   Critical Care Time devoted to patient care services, exclusive of separately billable procedures, described in this note is 35 minutes.   Marshell Garfinkel MD [facility] See Amion for pager  If no response to pager , please call [phone] until 7pm After 7:00 pm call Elink  [phone] 08-17-2021, 8:17 AM

## 2021-08-09 NOTE — Progress Notes (Signed)
ETT pulled back by 1cm per MD order.  ETT now secured at 23 at the lips.

## 2021-08-09 NOTE — Progress Notes (Signed)
Coox ~80%-- unlikely cardiogenic shock, favor ongoing septic shock as previously suspected. No response to larger dose of lasix given, no change in breathing. I discussed this with his daughter at bedside who is reluctant to proceed with intubation. She understands that he may tire out and this carries risk of hemodynamic compromise, including up to cardiac arrest, but if we see worsening of respiratory status it will be important to proceed with intubation. At this point he remains high risk for progressing to needing intubation at some point, but he has no urgent indications for intubation. She is favoring continued monitoring and holding off on intubation for now.  BP 95/62   Pulse (!) 101   Temp 97.7 F (36.5 C) (Oral)   Resp (!) 26   Wt 95 kg   SpO2 90%   BMI 34.32 kg/m  Remains tachypneic, similar to previous exam.  Fatigued appearing, still confused Remains tachycardic   Con't close ICU monitoring Bicarb gtt in d5w + thiamin Remains high risk for needing intubation in the next 12-24 hrs, but this seems to be mostly attributable to metabolic acidosis.  Cc time 15 min.  Julian Hy, DO Aug 16, 2021 2:43 AM [facility]

## 2021-08-09 NOTE — Progress Notes (Signed)
Patient's pulse ox not reading with a good pleth on arrival to room this AM.  RT obtain ABG on ventilator settings of VT: 500, RR: 30, FIO2: 90%, and PEEP: 5.0.  Increased FIO2 to 100%.  Results will be given to MD.  Will continue to monitor.    Latest Reference Range & Units 2021/07/20 08:55  Sample type  ARTERIAL  pH, Arterial 7.35 - 7.45  7.153 (LL)  pCO2 arterial 32 - 48 mmHg 28.1 (L)  pO2, Arterial 83 - 108 mmHg 81 (L)  TCO2 22 - 32 mmol/L 11 (L)  Acid-base deficit 0.0 - 2.0 mmol/L 18.0 (H)  Bicarbonate 20.0 - 28.0 mmol/L 10.0 (L)  O2 Saturation % 93  Patient temperature  97.3 F  Collection site  art line

## 2021-08-09 NOTE — Progress Notes (Signed)
Pt transported from 2M05 to CT and back with no complications.

## 2021-08-09 NOTE — Progress Notes (Signed)
Patient extubated per withdrawal of care per family decision.  No complications.

## 2021-08-09 NOTE — Progress Notes (Signed)
eLink Physician-Brief Progress Note Patient Name: Devin Hanna DOB: 1950/04/04 MRN: [id number]   Date of Service  07-24-2021  HPI/Events of Note  ABG reviewed.  eICU Interventions  1 amp of Bicarb ordered iv push, continue Bicarb gtt at 150 ml / hour, Calcium gluconate 2 gm iv bolus x 1.        Kerry Kass Baruc [place] July 24, 2021, 5:04 AM

## 2021-08-09 NOTE — Progress Notes (Signed)
ID PROGRESS NOTE  Clinically worsened within < 24hrs with increasing pressor support, respiratory distress/failure requiring intubation overnight with aspiration. Abtx escalated to vanco and amp/sub. Family at bedside, deciding to change code status to comfort care.   Nothing else from ID standpoint that can be added to change trajectory.  Will sign off.

## 2021-08-09 NOTE — Progress Notes (Signed)
ETT pulled back by 2cm by MD order. ETT secured at 24 at the lips.

## 2021-08-09 NOTE — Procedures (Signed)
Intubation Procedure Note  BURAK ZERBE  [id number]  1950-10-02  Date:Jul 21, 2021  Time:3:23 AM   Provider Performing:Ginnifer Creelman P Carlis Abbott    Procedure: Intubation (40981)  Indication(s) Respiratory Failure  Consent Risks of the procedure as well as the alternatives and risks of each were explained to the patient and/or caregiver.  Consent for the procedure was obtained and is signed in the bedside chart   Anesthesia Versed, Fentanyl, and Rocuronium. Bicarb given and pressors increased prior to intubation.   Time Out Verified patient identification, verified procedure, site/side was marked, verified correct patient position, special equipment/implants available, medications/allergies/relevant history reviewed, required imaging and test results available.   Sterile Technique Usual hand hygeine, masks, and gloves were used   Procedure Description Patient positioned in bed supine.  Sedation given as noted above.  Patient was intubated with endotracheal tube using Glidescope.  View was Grade 1 full glottis .  Number of attempts was 1.  Colorimetric CO2 detector was consistent with tracheal placement.   Complications/Tolerance Vomited in supine position prior to intubation. Secretions suctioned from airway and BVM not performed after RSI> moved emergently to intubation once airway was cleared. Chest X-ray is ordered to verify placement.   EBL Minimal   Specimen(s) None  Julian Hy, DO 2021-07-21 3:24 AM [facility]

## 2021-08-09 NOTE — IPAL (Signed)
  Interdisciplinary Goals of Care Family Meeting   Date carried out:: July 29, 2021  Location of the meeting: Bedside  Member's involved: Physician, Bedside Registered Nurse, and Family Member or next of kin  Durable Power of Attorney or acting medical decision maker: Daughter    Discussion: We discussed goals of care for Devin Hanna .  We discussed his status with multiorgan failure and poor prognosis for recovery.  Family has requested transition to comfort measures and orders are placed.  Code status: Full DNR  Disposition: In-patient comfort care   Time spent for the meeting: 10 mins  [place] 07/29/21, 12:29 PM

## 2021-08-09 NOTE — Progress Notes (Signed)
eLink Physician-Brief Progress Note Patient Name: Devin Hanna DOB: Aug 16, 1950 MRN: [id number]   Date of Service  2021/08/04  HPI/Events of Note  Patient with agonal respirations.  eICU Interventions  PCCM ground crew requested to come and intubate the patient.        Kerry Kass Tiawanna Luchsinger 04-Aug-2021, 3:14 AM

## 2021-08-09 NOTE — Progress Notes (Signed)
Respirations agonal, minimally responsive. Intubation indicated at this point with remaining full code. Pressors increased and 1 amp bicarb given in preparation for RSI. Vomited in supine position before intubation attempt. He was orally suctioned and rolled on his side while passively oxygenated via [place] for about 30 seconds and immediately intubated when rolled back supine. No desaturation or hemodynamic instability. Daughter updated after intubation.  Julian Hy, DO July 23, 2021 3:27 AM [facility]

## 2021-08-09 NOTE — Death Summary Note (Signed)
  DEATH SUMMARY   Patient Details  Name: Devin Hanna MRN: [id number] DOB: [birth year]  Admission/Discharge Information   Admit Date:  07-16-2021  Date of Death: Date of Death: July 20, 2021  Time of Death: Time of Death: 06-Apr-1406  Length of Stay: 4  Referring Physician: [name], MD   Reason(s) for Hospitalization   Altered mental status, fall  Diagnoses  Preliminary cause of death:  Severe sepsis present on admission Septic shock Salmonella bacteremia Multiorgan failure  Secondary Diagnoses (including complications and co-morbidities):  Principal Problem:   Septic shock ([place]) Active Problems:   Sepsis due to Salmonella species with acute renal failure and septic shock ([place])   AKI (acute kidney injury) ([place])   Acute metabolic encephalopathy Pulmonary embolism right lower lobe GI bleed Chronic HFrEF Afib, with RVR Acute metabolic encephalopathy secondary to sepsis, delirium Acute kidney injury secondary to ATN from sepsis Severe acidosis Elevated LFTs Hepatocellular carcinoma Diabetes mellitius  Brief Hospital Course (including significant findings, care, treatment, and services provided and events leading to death)  71 yo male with pmh cad, dm2, obesity, htn, pud, ckdIIIa, paf, cirrhosis, recently dx's hepatocellular carcinoma s/p radiation therapy presented today after being found by his neighbor on the bathroom floor unable to get up. Pt states he was urinating and when he finished he became slightly light headed and tripped over his feet vs pass out. He is unclear about the specifics. When EMS arrived however he estimated he had been on the floor for approx. 30 mins. He was aaox3 and complaining of back pain (which is also chronic). He states he has been in his usual state of health prior to this event.    Noted to be in severe sepsis on admission with Salmonella bacteremia.  He was started on pressors and admitted to the ICU. Infectious disease was consulted for  recommendations regarding antibiotic coverage he was initially placed on ceftriaxone. Through his hospital course he remained delirious and confused.  His sepsis continued to get worse and antibiotics were broadened to Vanco, ampicillin, sulbactam on 6/9.  CT chest abdomen pelvis with new pneumonia on the right chest likely due to aspiration.  He was intubated for respiratory protection but continued to deteriorate requiring multiple pressors with refractory shock.  We had a family discussion at the bedside and decision made to transition to comfort measures.  He was extubated and passed away later that day on 2022-07-21.  Signature:   Marshell Garfinkel MD [facility] 07/21/2021, 1:44 PM

## 2021-08-09 DEATH — deceased

## 2021-08-19 ENCOUNTER — Ambulatory Visit: Payer: PPO | Admitting: Physician Assistant

## 2021-09-03 ENCOUNTER — Other Ambulatory Visit (HOSPITAL_COMMUNITY): Payer: PPO

## 2021-09-16 ENCOUNTER — Ambulatory Visit: Payer: PPO | Admitting: Gastroenterology

## 2022-09-23 IMAGING — MR MR ABDOMEN WO/W CM
9 of 16 series · 20 of 48 positions shown · IV contrast (gadavist)
Comparison: 08/19/2020

CLINICAL DATA: Follow-up hepatocellular carcinoma. Cirrhosis.
Status post [AGE] radioembolization.

EXAM:
MRI ABDOMEN WITHOUT AND WITH CONTRAST
TECHNIQUE: Multiplanar multisequence MR imaging of the abdomen was performed
both before and after the administration of intravenous contrast.
CONTRAST:  10mL GADAVIST GADOBUTROL 1 MMOL/ML IV SOLN

[Series 5: T2 · coronal · 5.0mm · 0.82mm/px · 1 of 50 slices shown (1 of 2)]
[im 1/50]
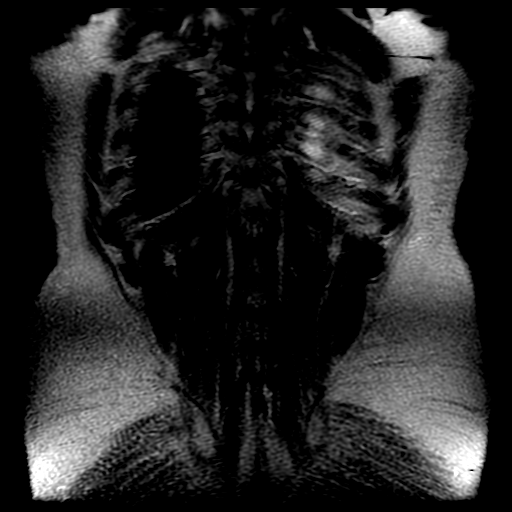

[Series 6: T2 · axial · 5.0mm · 0.82mm/px · 1 of 36 slices shown (2 of 2)]
[im 1/36]
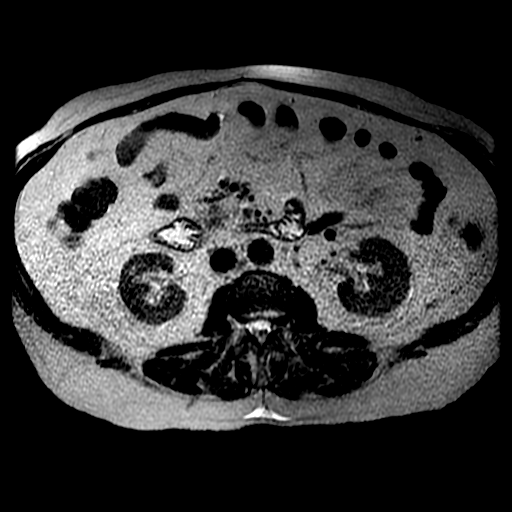

[Series 7: T2 fat-sat · axial · 5.0mm · 0.82mm/px · 1 of 36 slices shown]
[im 1/36]
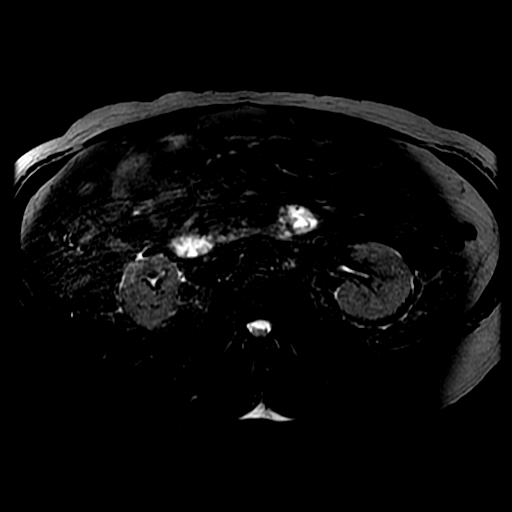

[Series 8: DWI b500 · axial · 6.0mm · 1.64mm/px · 1 of 46 slices shown]
[im 1/46]
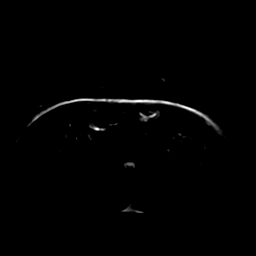

[Series 10: ax dualecho · axial · 5.0mm · 0.82mm/px · z∈[-22,+153]mm · 2 of 72 slices shown]
[im 1/72]
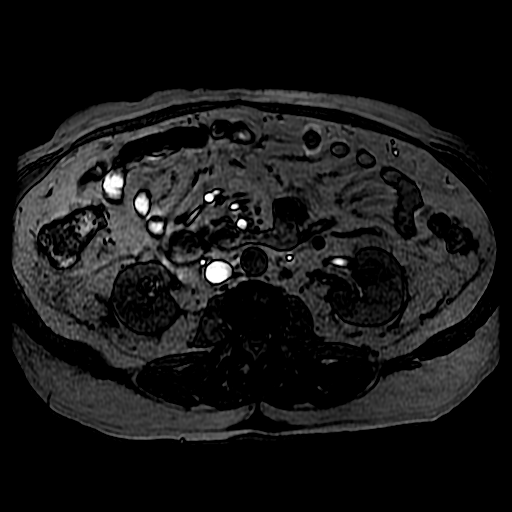
[im 72/72]
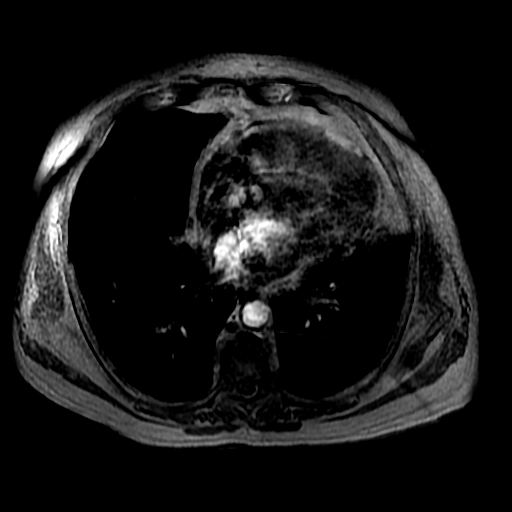

[Series 12: T1 dynamic post-contrast · coronal · 5.0mm · 0.82mm/px · 5 of 108 slices shown]
[im 1/108]
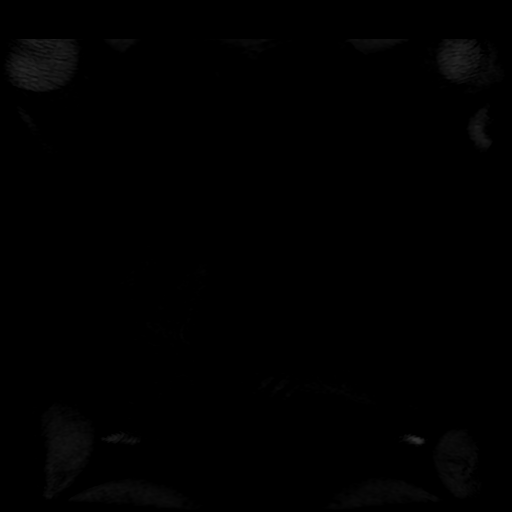
[im 27/108]
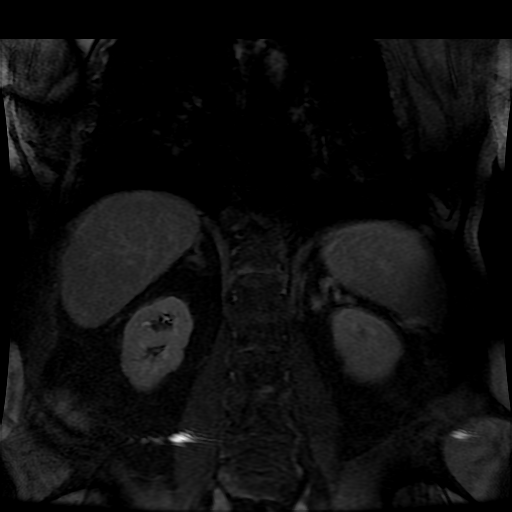
[im 54/108]
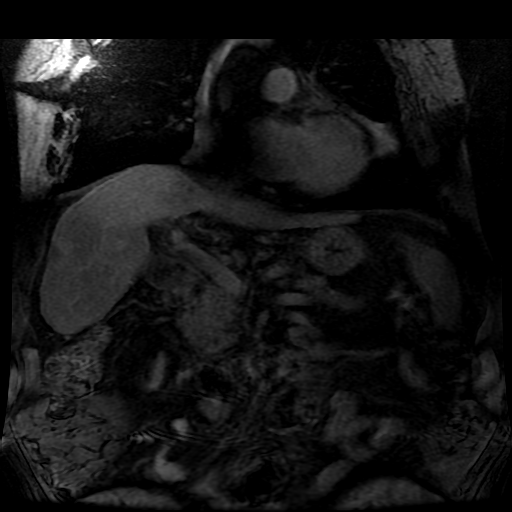
[im 81/108]
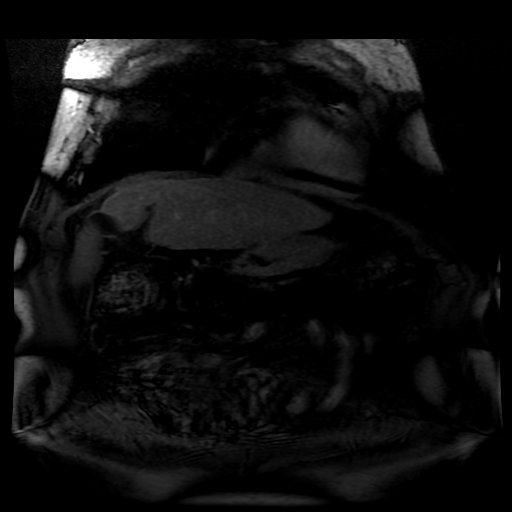
[im 108/108]
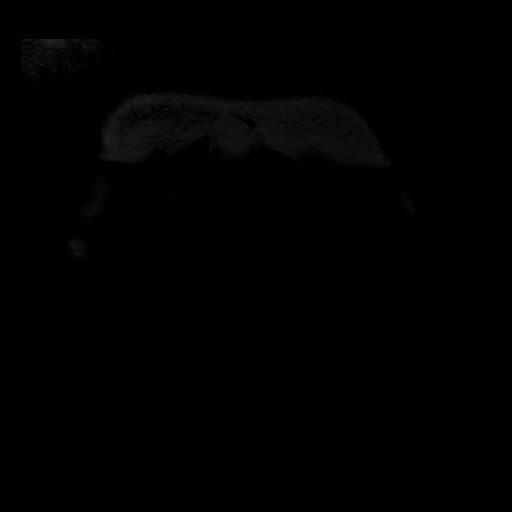

[Series 800: DWI · axial · 6.0mm · 1.64mm/px · 1 of 23 slices shown]
[im 1/23]
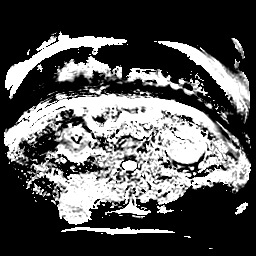

[Series 1100: T1 dynamic · axial · 5.0mm · 0.82mm/px · z∈[-45,+153]mm · 4 of 80 slices shown (1 of 2)]
[im 1/80]
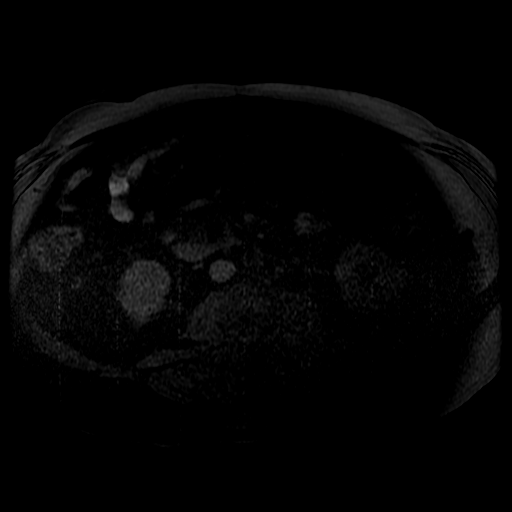
[im 27/80]
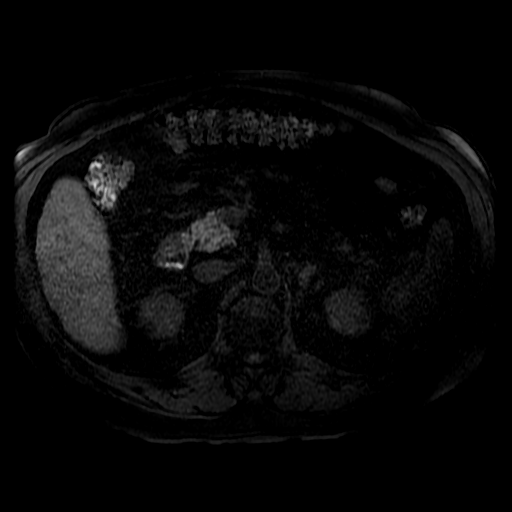
[im 53/80]
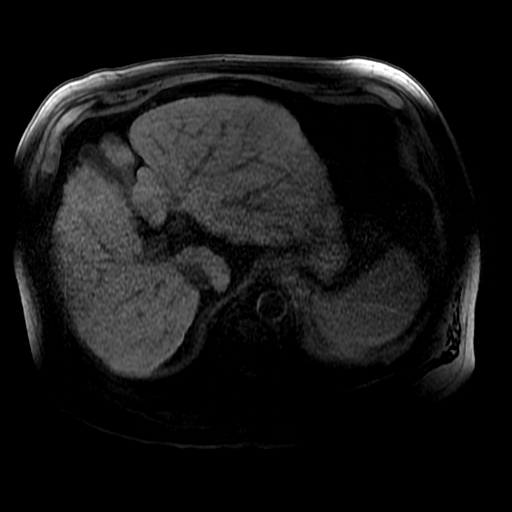
[im 80/80]
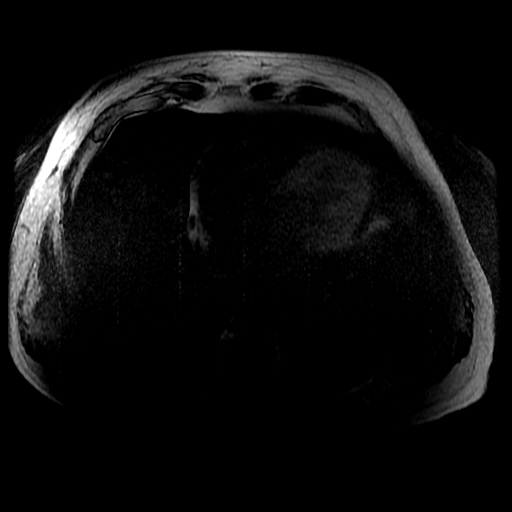

[Series 1101: T1 dynamic · axial · 5.0mm · 0.82mm/px · z∈[-45,+153]mm · 4 of 80 slices shown (2 of 2)]
[im 1/80]
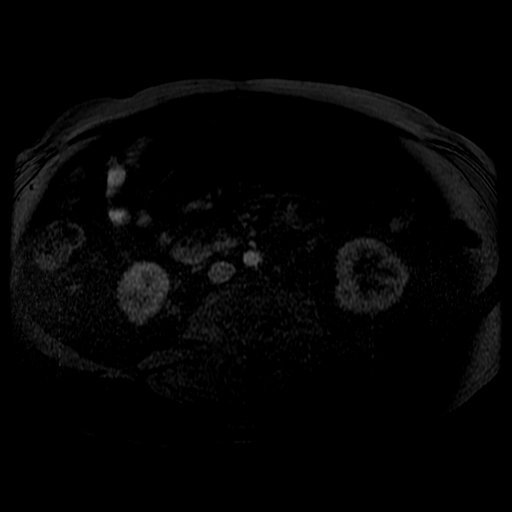
[im 27/80]
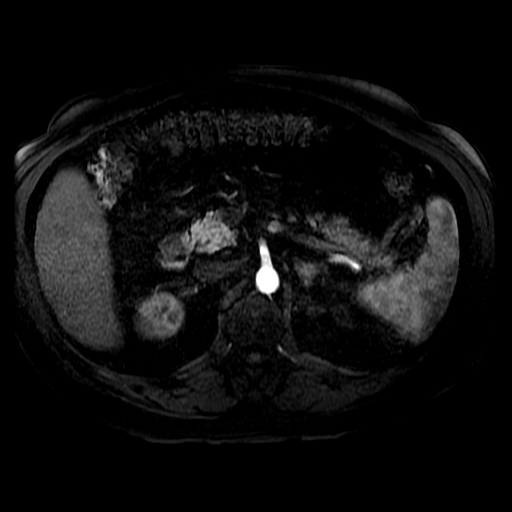
[im 53/80]
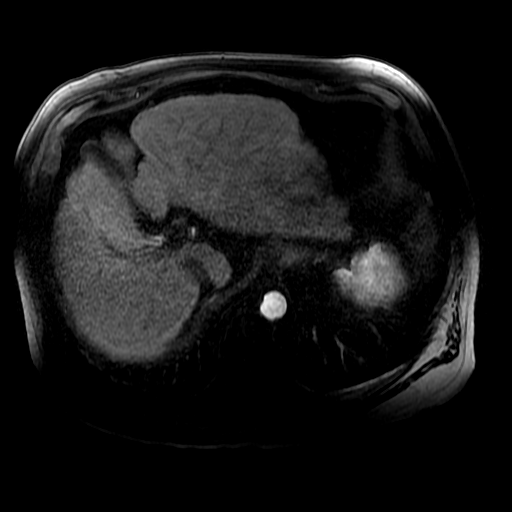
[im 80/80]
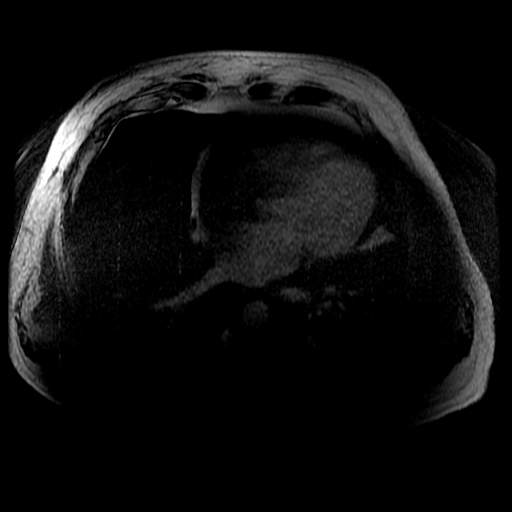

[20 of 48 positions shown; findings below may reference images not displayed]

FINDINGS: Lower chest: No acute findings.

Hepatobiliary: Hepatic cirrhosis again demonstrated. Perfusion
anomaly is seen in segment 8 of the right lobe which is likely due
to post treatment right portal vein stenosis or occlusion. 1.9 cm
lesion is seen along the anterior aspect of this perfusion anomaly,
and a similar 1.0 cm lesion is seen along the posterolateral aspect
of this perfusion anomaly (see image [id number]). Both of these
lesions show mild central enhancement and peripheral rim
enhancement, and are highly suspicious for residual foci of
hepatocellular carcinoma.

Tiny gallstones are again seen in the gallbladder neck, however
there is no evidence of cholecystitis or biliary ductal dilatation.

Pancreas:  No mass or inflammatory changes.

Spleen:  Within normal limits in size and appearance.

Adrenals/Urinary Tract: No masses identified. Stable tiny right
renal cyst. No evidence of hydronephrosis.

Stomach/Bowel: Visualized portion unremarkable.

Vascular/Lymphatic: No pathologically enlarged lymph nodes
identified. Sub-centimeter lymph nodes in the gastrohepatic ligament
and porta hepatis remains stable. No acute vascular findings.
Portosystemic venous collaterals again seen in the gastrosplenic and
splenorenal ligaments, consistent with portal venous hypertension.

Other:  None.

Musculoskeletal:  No suspicious bone lesions identified.
IMPRESSION: Two peripherally enhancing lesions in segment 8 of the right hepatic
lobe, which both border a post-treatment perfusion anomaly, highly
suspicious for residual foci of hepatocellular carcinoma. JOHANY TR
Viable

No evidence of abdominal metastatic disease.

Cirrhosis with portosystemic venous collaterals, consistent with
portal venous hypertension.

Cholelithiasis. No radiographic evidence of cholecystitis or biliary
ductal dilatation.
